# Patient Record
Sex: Female | Born: 1940
Health system: Southern US, Community
[De-identification: ages and names within clinical notes are randomized; demographics above are authoritative.]

## PROBLEM LIST (undated history)

## (undated) DIAGNOSIS — E785 Hyperlipidemia, unspecified: Secondary | ICD-10-CM

## (undated) DIAGNOSIS — Z8711 Personal history of peptic ulcer disease: Secondary | ICD-10-CM

## (undated) DIAGNOSIS — J189 Pneumonia, unspecified organism: Secondary | ICD-10-CM

## (undated) DIAGNOSIS — N179 Acute kidney failure, unspecified: Secondary | ICD-10-CM

## (undated) DIAGNOSIS — K859 Acute pancreatitis without necrosis or infection, unspecified: Secondary | ICD-10-CM

## (undated) DIAGNOSIS — E11610 Type 2 diabetes mellitus with diabetic neuropathic arthropathy: Secondary | ICD-10-CM

## (undated) DIAGNOSIS — I10 Essential (primary) hypertension: Secondary | ICD-10-CM

## (undated) DIAGNOSIS — R011 Cardiac murmur, unspecified: Secondary | ICD-10-CM

## (undated) DIAGNOSIS — I872 Venous insufficiency (chronic) (peripheral): Secondary | ICD-10-CM

## (undated) DIAGNOSIS — R5381 Other malaise: Secondary | ICD-10-CM

## (undated) DIAGNOSIS — E119 Type 2 diabetes mellitus without complications: Secondary | ICD-10-CM

## (undated) DIAGNOSIS — I5042 Chronic combined systolic (congestive) and diastolic (congestive) heart failure: Secondary | ICD-10-CM

## (undated) DIAGNOSIS — I219 Acute myocardial infarction, unspecified: Secondary | ICD-10-CM

## (undated) DIAGNOSIS — Z8719 Personal history of other diseases of the digestive system: Secondary | ICD-10-CM

## (undated) DIAGNOSIS — I2510 Atherosclerotic heart disease of native coronary artery without angina pectoris: Secondary | ICD-10-CM

## (undated) DIAGNOSIS — Z9289 Personal history of other medical treatment: Secondary | ICD-10-CM

## (undated) DIAGNOSIS — E1142 Type 2 diabetes mellitus with diabetic polyneuropathy: Secondary | ICD-10-CM

## (undated) DIAGNOSIS — I350 Nonrheumatic aortic (valve) stenosis: Secondary | ICD-10-CM

## (undated) DIAGNOSIS — Z87442 Personal history of urinary calculi: Secondary | ICD-10-CM

## (undated) DIAGNOSIS — G43909 Migraine, unspecified, not intractable, without status migrainosus: Secondary | ICD-10-CM

## (undated) DIAGNOSIS — I214 Non-ST elevation (NSTEMI) myocardial infarction: Secondary | ICD-10-CM

## (undated) HISTORY — PX: FOOT SURGERY: SHX648

## (undated) HISTORY — DX: Acute pancreatitis without necrosis or infection, unspecified: K85.90

## (undated) HISTORY — PX: EXCISIONAL HEMORRHOIDECTOMY: SHX1541

## (undated) HISTORY — PX: CATARACT EXTRACTION W/ INTRAOCULAR LENS  IMPLANT, BILATERAL: SHX1307

## (undated) HISTORY — PX: APPENDECTOMY: SHX54

## (undated) HISTORY — DX: Type 2 diabetes mellitus with diabetic neuropathic arthropathy: E11.610

## (undated) HISTORY — DX: Cardiac murmur, unspecified: R01.1

## (undated) HISTORY — DX: Nonrheumatic aortic (valve) stenosis: I35.0

## (undated) HISTORY — PX: GLAUCOMA SURGERY: SHX656

## (undated) HISTORY — PX: CARPAL TUNNEL RELEASE: SHX101

## (undated) HISTORY — PX: ABDOMINAL HYSTERECTOMY: SHX81

## (undated) HISTORY — PX: OVARIAN CYST REMOVAL: SHX89

## (undated) HISTORY — DX: Acute myocardial infarction, unspecified: I21.9

## (undated) HISTORY — DX: Hyperlipidemia, unspecified: E78.5

## (undated) HISTORY — DX: Essential (primary) hypertension: I10

## (undated) HISTORY — PX: TONSILLECTOMY: SUR1361

## (undated) HISTORY — PX: DILATION AND CURETTAGE OF UTERUS: SHX78

---

## 2002-07-27 ENCOUNTER — Other Ambulatory Visit: Admission: RE | Admit: 2002-07-27 | Discharge: 2002-07-27 | Payer: Self-pay | Admitting: Obstetrics and Gynecology

## 2003-01-04 ENCOUNTER — Ambulatory Visit (HOSPITAL_COMMUNITY): Admission: RE | Admit: 2003-01-04 | Discharge: 2003-01-04 | Payer: Self-pay | Admitting: Obstetrics and Gynecology

## 2003-06-02 ENCOUNTER — Encounter (INDEPENDENT_AMBULATORY_CARE_PROVIDER_SITE_OTHER): Payer: Self-pay | Admitting: Specialist

## 2003-06-02 ENCOUNTER — Inpatient Hospital Stay (HOSPITAL_COMMUNITY): Admission: RE | Admit: 2003-06-02 | Discharge: 2003-06-04 | Payer: Self-pay | Admitting: Obstetrics and Gynecology

## 2005-05-13 HISTORY — PX: LAPAROSCOPIC LYSIS OF ADHESIONS: SHX5905

## 2005-10-04 ENCOUNTER — Encounter: Admission: RE | Admit: 2005-10-04 | Discharge: 2005-10-04 | Payer: Self-pay | Admitting: Surgery

## 2005-10-31 ENCOUNTER — Encounter: Payer: Self-pay | Admitting: Cardiology

## 2005-10-31 ENCOUNTER — Ambulatory Visit: Payer: Self-pay

## 2005-10-31 ENCOUNTER — Ambulatory Visit: Payer: Self-pay | Admitting: Cardiology

## 2005-11-01 ENCOUNTER — Inpatient Hospital Stay (HOSPITAL_COMMUNITY): Admission: RE | Admit: 2005-11-01 | Discharge: 2005-11-03 | Payer: Self-pay | Admitting: Obstetrics and Gynecology

## 2005-12-05 ENCOUNTER — Inpatient Hospital Stay (HOSPITAL_COMMUNITY): Admission: AD | Admit: 2005-12-05 | Discharge: 2005-12-05 | Payer: Self-pay | Admitting: Obstetrics and Gynecology

## 2005-12-07 ENCOUNTER — Emergency Department (HOSPITAL_COMMUNITY): Admission: EM | Admit: 2005-12-07 | Discharge: 2005-12-08 | Payer: Self-pay | Admitting: Emergency Medicine

## 2005-12-12 ENCOUNTER — Ambulatory Visit (HOSPITAL_COMMUNITY): Admission: RE | Admit: 2005-12-12 | Discharge: 2005-12-12 | Payer: Self-pay | Admitting: Urology

## 2005-12-16 ENCOUNTER — Ambulatory Visit: Payer: Self-pay | Admitting: Internal Medicine

## 2007-02-11 HISTORY — PX: ROBOT ASSISTED PYELOPLASTY: SHX5143

## 2007-02-17 ENCOUNTER — Ambulatory Visit (HOSPITAL_COMMUNITY): Admission: RE | Admit: 2007-02-17 | Discharge: 2007-02-17 | Payer: Self-pay | Admitting: Urology

## 2007-02-19 ENCOUNTER — Ambulatory Visit (HOSPITAL_COMMUNITY): Admission: RE | Admit: 2007-02-19 | Discharge: 2007-02-19 | Payer: Self-pay | Admitting: Urology

## 2007-02-24 ENCOUNTER — Ambulatory Visit: Payer: Self-pay | Admitting: Cardiology

## 2007-03-09 ENCOUNTER — Encounter (INDEPENDENT_AMBULATORY_CARE_PROVIDER_SITE_OTHER): Payer: Self-pay | Admitting: Urology

## 2007-03-09 ENCOUNTER — Inpatient Hospital Stay (HOSPITAL_COMMUNITY): Admission: RE | Admit: 2007-03-09 | Discharge: 2007-03-11 | Payer: Self-pay | Admitting: Urology

## 2007-07-09 ENCOUNTER — Ambulatory Visit (HOSPITAL_COMMUNITY): Admission: RE | Admit: 2007-07-09 | Discharge: 2007-07-09 | Payer: Self-pay | Admitting: Urology

## 2007-11-12 ENCOUNTER — Encounter: Admission: RE | Admit: 2007-11-12 | Discharge: 2007-11-12 | Payer: Self-pay | Admitting: Surgery

## 2007-11-26 ENCOUNTER — Ambulatory Visit: Payer: Self-pay | Admitting: Cardiovascular Disease

## 2007-12-09 ENCOUNTER — Ambulatory Visit: Payer: Self-pay

## 2007-12-09 ENCOUNTER — Encounter: Payer: Self-pay | Admitting: Cardiology

## 2007-12-28 ENCOUNTER — Encounter: Admission: RE | Admit: 2007-12-28 | Discharge: 2007-12-28 | Payer: Self-pay | Admitting: General Surgery

## 2008-01-04 HISTORY — PX: LAPAROSCOPIC INCISIONAL / UMBILICAL / VENTRAL HERNIA REPAIR: SUR789

## 2008-01-09 ENCOUNTER — Emergency Department (HOSPITAL_COMMUNITY): Admission: EM | Admit: 2008-01-09 | Discharge: 2008-01-09 | Payer: Self-pay | Admitting: Emergency Medicine

## 2008-03-14 ENCOUNTER — Ambulatory Visit (HOSPITAL_COMMUNITY): Admission: RE | Admit: 2008-03-14 | Discharge: 2008-03-14 | Payer: Self-pay | Admitting: Urology

## 2008-11-25 DIAGNOSIS — E1122 Type 2 diabetes mellitus with diabetic chronic kidney disease: Secondary | ICD-10-CM | POA: Insufficient documentation

## 2008-11-25 DIAGNOSIS — Z8719 Personal history of other diseases of the digestive system: Secondary | ICD-10-CM | POA: Insufficient documentation

## 2008-11-25 DIAGNOSIS — N183 Chronic kidney disease, stage 3 (moderate): Secondary | ICD-10-CM

## 2008-11-25 DIAGNOSIS — I350 Nonrheumatic aortic (valve) stenosis: Secondary | ICD-10-CM | POA: Insufficient documentation

## 2008-11-28 ENCOUNTER — Ambulatory Visit: Payer: Self-pay | Admitting: Cardiology

## 2008-11-28 DIAGNOSIS — E663 Overweight: Secondary | ICD-10-CM | POA: Insufficient documentation

## 2009-04-03 ENCOUNTER — Ambulatory Visit (HOSPITAL_COMMUNITY): Admission: RE | Admit: 2009-04-03 | Discharge: 2009-04-03 | Payer: Self-pay | Admitting: Urology

## 2009-06-26 ENCOUNTER — Telehealth (INDEPENDENT_AMBULATORY_CARE_PROVIDER_SITE_OTHER): Payer: Self-pay | Admitting: *Deleted

## 2009-07-04 ENCOUNTER — Ambulatory Visit (HOSPITAL_BASED_OUTPATIENT_CLINIC_OR_DEPARTMENT_OTHER): Admission: RE | Admit: 2009-07-04 | Discharge: 2009-07-04 | Payer: Self-pay | Admitting: Urology

## 2009-08-17 HISTORY — PX: SUTURE REMOVAL: SHX1060

## 2009-11-27 ENCOUNTER — Ambulatory Visit: Payer: Self-pay | Admitting: Cardiology

## 2009-11-27 DIAGNOSIS — R079 Chest pain, unspecified: Secondary | ICD-10-CM | POA: Insufficient documentation

## 2009-12-12 ENCOUNTER — Ambulatory Visit: Payer: Self-pay

## 2009-12-12 ENCOUNTER — Ambulatory Visit: Payer: Self-pay | Admitting: Cardiology

## 2009-12-12 ENCOUNTER — Encounter: Payer: Self-pay | Admitting: Cardiology

## 2009-12-12 ENCOUNTER — Ambulatory Visit (HOSPITAL_COMMUNITY): Admission: RE | Admit: 2009-12-12 | Discharge: 2009-12-12 | Payer: Self-pay | Admitting: Cardiology

## 2010-05-28 ENCOUNTER — Ambulatory Visit (HOSPITAL_COMMUNITY)
Admission: RE | Admit: 2010-05-28 | Discharge: 2010-05-28 | Payer: Self-pay | Source: Home / Self Care | Attending: Urology | Admitting: Urology

## 2010-06-03 ENCOUNTER — Encounter: Payer: Self-pay | Admitting: Surgery

## 2010-06-12 NOTE — Assessment & Plan Note (Signed)
Summary: yearly/sl   Visit Type:  Follow-up Primary Provider:  Dr. Jeanie Sewer  CC:  Aortic Stenosis.  History of Present Illness: The patient returns for yearly followup. She has mild aortic stenosis. She has also had chest discomfort with a negative catheterization in 1997 and stress perfusion study with no ischemia in 2009. She still occasionally gets the same kind of chest discomfort that she said over the years. In fact she has taken 2 nitroglycerin in the past year. She thinks it is a stable infrequent pattern. She denies any exertional reproducible discomfort. She can do such activities such as vacuuming though she is limited by joint and feet pain. She denies any new shortness of breath, PND or orthopnea. She denies any palpitations, presyncope or syncope.  Current Medications (verified): 1)  Metformin Hcl 1000 Mg Tabs (Metformin Hcl) .... Two Times A Day 2)  Glimeperide 5mg  .... 1/2 Tab Two Times A Day 3)  Macrobid 100 Mg Caps (Nitrofurantoin Monohyd Macro) .... At Bedtime 4)  Aspirin 81 Mg Tbec (Aspirin) .... Take One Tablet By Mouth Daily 5)  Multivitamins   Tabs (Multiple Vitamin) .... Once Daily 6)  Celebrex 200 Mg Caps (Celecoxib) .... Once Daily As Needed 7)  Enalapril Maleate 20 Mg Tabs (Enalapril Maleate) .Marland Kitchen.. 1 Po Daily 8)  Tramadol Hcl 50 Mg Tabs (Tramadol Hcl) .... As Needed 9)  Pravastatin Sodium 20 Mg Tabs (Pravastatin Sodium) .Marland Kitchen.. 1 By Mouth Daily  Allergies (verified): 1)  ! Levaquin  Past History:  Past Medical History: 1. Diabetes. 2. Pancreatitis. 3. Mild aortic stenosis   Past Surgical History: Reviewed history from 11/28/2008 and no changes required. 1. Removal of an ovarian cyst. 2. Hysterectomy. 3. Tonsillectomy. 4. Exploratory laparotomy and adhesion lysis.  5. 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 Laverle Patter) 6. Ventral Hernia Repair  Review of Systems       Positive for chronic right lower quadrant abdominal  discomfort. Otherwise as stated in the history of present illness negative for all other systems.  Vital Signs:  Patient profile:   70 year old female Height:      65 inches Weight:      163 pounds BMI:     27.22 Pulse rate:   64 / minute Resp:     16 per minute BP sitting:   168 / 82  (right arm)  Vitals Entered By: Marrion Coy, CNA (November 27, 2009 2:02 PM)  Physical Exam  General:  Well developed, well nourished, in no acute distress. Head:  normocephalic and atraumatic Mouth:  Teeth, gums and palate normal. Oral mucosa normal. Neck:  Neck supple, no JVD. No masses, thyromegaly or abnormal cervical nodes. Chest Wall:  no deformities or breast masses noted Lungs:  Clear bilaterally to auscultation and percussion. Abdomen:  Bowel sounds positive; abdomen soft and non-tender without masses, organomegaly, or hernias noted. No hepatosplenomegaly. Msk:  Back normal, normal gait. Muscle strength and tone normal. Extremities:  No clubbing or cyanosis. Neurologic:  Alert and oriented x 3. Skin:  Intact without lesions or rashes. Cervical Nodes:  no significant adenopathy Axillary Nodes:  no significant adenopathy Inguinal Nodes:  no significant adenopathy Psych:  Normal affect.   Detailed Cardiovascular Exam  Neck    Carotids: Carotids full and equal bilaterally without bruits, positive for transmitted systolic murmur right carotid    Neck Veins: Normal, no JVD.    Heart    Inspection: no deformities or lifts noted.      Palpation:  normal PMI with no thrills palpable.      Auscultation: S1 and S2 within normal limits, no S3, no S4, no clicks, no rubs, 3/6 apical systolic murmur mid peaking and radiating up the aortic outflow tract, no diastolic murmurs  Vascular    Abdominal Aorta: no palpable masses, pulsations, or audible bruits.      Femoral Pulses: normal femoral pulses bilaterally.      Pedal Pulses: normal pedal pulses bilaterally.      Radial Pulses: normal radial  pulses bilaterally.      Peripheral Circulation: no clubbing, cyanosis, or edema noted with normal capillary refill.     EKG  Procedure date:  11/27/2009  Findings:      Sinus rhythm, rate 64, axis within normal limits, intervals within normal limits, no acute ST-T wave changes  Impression & Recommendations:  Problem # 1:  AORTIC STENOSIS (ICD-424.1) I believe her murmur is slightly more prominent than previous. It has been 2 years since the last echo and I will repeat this. Orders: EKG w/ Interpretation (93000) Echocardiogram (Echo)  Problem # 2:  ESSENTIAL HYPERTENSION, BENIGN (ICD-401.1) Her blood pressure remains elevated. She recently had her meds adjusted by her primary care. I have asked her to keep a blood pressure diary to return to him as she may need further med titration. Orders: EKG w/ Interpretation (93000)  Problem # 3:  OVERWEIGHT (ICD-278.02) She understands the need for weight loss with diet and exercise.  Problem # 4:  CHEST PAIN (ICD-786.50) I reviewed carefully with her her chest pain symptoms and think this is unchanged compared with previous. I think the possibility that she has developed obstructive coronary disease since the last stress test is low. No further testing is indicated. She needs primary risk reduction.  Patient Instructions: 1)  Your physician recommends that you schedule a follow-up appointment in: 1 year with Dr Antoine Poche 2)  Your physician has recommended you make the following change in your medication: Pravastatin 20 mg every evening 3)  Your physician has requested that you have an echocardiogram.  Echocardiography is a painless test that uses sound waves to create images of your heart. It provides your doctor with information about the size and shape of your heart and how well your heart's chambers and valves are working.  This procedure takes approximately one hour. There are no restrictions for this procedure.

## 2010-06-12 NOTE — Progress Notes (Signed)
  Phone Note From Other Clinic   Caller: [facility] Details for Reason: Pt.Information Initial call taken by: kim M    Faxed LOV,Stress, over to [phone] [facility]  June 26, 2009 10:03 AM

## 2010-06-12 NOTE — Assessment & Plan Note (Signed)
Summary: f1y per check out/lg   Visit Type:  Follow-up Primary Provider:  Dr. Jeanie Sewer  CC:  Aortic Stenosis.  History of Present Illness: The patient presents for yearly followup. Since we last saw her she had ventral hernia repair. She had no problems with this. She is unable to be particularly active because of a Charcot joint and significant foot pain. She does her activities of daily living which includes vacuuming in her home. With this she denies any chest pressure, neck or arm discomfort. She has no shortness of breath, PND or orthopnea. She has no palpitations, presyncope or syncope. She unfortunately has had a weight gain because of inactivity. She has noticed that her blood pressure is creeping up.  Current Medications (verified): 1)  Metformin Hcl 1000 Mg Tabs (Metformin Hcl) .... Two Times A Day 2)  Glimeperide 5mg  .... 1/2 Tab Two Times A Day 3)  Macrobid 100 Mg Caps (Nitrofurantoin Monohyd Macro) .... At Bedtime 4)  Aspirin 81 Mg Tbec (Aspirin) .... Take One Tablet By Mouth Daily 5)  Multivitamins   Tabs (Multiple Vitamin) .... Once Daily 6)  Celebrex 200 Mg Caps (Celecoxib) .... Once Daily As Needed 7)  Enalapril Maleate 5 Mg Tabs (Enalapril Maleate) .... One By Mouth Daily  Allergies (verified): 1)  ! Levaquin  Past History:  Past Medical History: Reviewed history from 11/25/2008 and no changes required. 1. Diabetes. 2. Pancreatitis. 3. Mild aortic stenosis as described.   Past Surgical History: 1. Removal of an ovarian cyst. 2. Hysterectomy. 3. Tonsillectomy. 4. Exploratory laparotomy and adhesion lysis.  5. 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 Laverle Patter) 6. Ventral Hernia Repair  Review of Systems       Resting tremor. Otherwise negative for all other systems except as stated in the history of present illness.  Vital Signs:  Patient profile:   70 year old female Height:      65 inches Weight:      169 pounds BMI:      28.22 Pulse rate:   76 / minute BP sitting:   156 / 82  (right arm) Cuff size:   regular  Vitals Entered By: Hardin Negus, RMA (November 28, 2008 2:02 PM)  Physical Exam  General:  Well developed, well nourished, in no acute distress. Head:  normocephalic and atraumatic Eyes:  PERRLA/EOM intact; conjunctiva and lids normal. Mouth:  Teeth, gums and palate normal. Oral mucosa normal. Neck:  Neck supple, no JVD. No masses, thyromegaly or abnormal cervical nodes. Chest Wall:  no deformities or breast masses noted Lungs:  Clear bilaterally to auscultation and percussion. Abdomen:  Bowel sounds positive; abdomen soft and non-tender without masses, organomegaly, or hernias noted. No hepatosplenomegaly. Msk:  Back normal, normal gait. Muscle strength and tone normal. Extremities:  No clubbing or cyanosis. Neurologic:  Alert and oriented x 3. Skin:  Intact without lesions or rashes. Cervical Nodes:  no significant adenopathy Axillary Nodes:  no significant adenopathy Inguinal Nodes:  no significant adenopathy Psych:  Normal affect.   Detailed Cardiovascular Exam  Neck    Carotids: Carotids full and equal bilaterally without bruits.      Neck Veins: Normal, no JVD.    Heart    Inspection: no deformities or lifts noted.      Palpation: normal PMI with no thrills palpable.      Auscultation: 3/6 systolic murmur radiating slightly at the aortic outflow tract, no diastolic murmurs, no clicks, no rubs, S1 and S2 within normal  limits  Vascular    Abdominal Aorta: no palpable masses, pulsations, or audible bruits.      Femoral Pulses: normal femoral pulses bilaterally.      Pedal Pulses: normal pedal pulses bilaterally.      Radial Pulses: normal radial pulses bilaterally.      Peripheral Circulation: no clubbing, cyanosis, or edema noted with normal capillary refill.     EKG  Procedure date:  11/28/2008  Findings:      sinus rhythm, rate 76, axis within normal limits, intervals  within normal limits, no acute ST-T wave changes.  Impression & Recommendations:  Problem # 1:  AORTIC STENOSIS (ICD-424.1) Her systolic murmur would not suggest that her aortic stenosis is worse. Her EKG is unremarkable. She has no symptoms suggesting severe aortic stenosis. Therefore, no echocardiogram is indicated. We will follow this clinically. Orders: EKG w/ Interpretation (93000)  Problem # 2:  ESSENTIAL HYPERTENSION, BENIGN (ICD-401.1) Her blood pressure has been creeping up. I have taken the liberty of increasing her enalapril to 5 mg b.i.d.  Problem # 3:  DM (ICD-250.00) She reports that this is well controlled. She also reports that her lipids are within normal limits.  Problem # 4:  OVERWEIGHT (ICD-278.02) The patient understands the need to lose weight with diet and exercise.  Patient Instructions: 1)  Your physician recommends that you schedule a follow-up appointment in: 12 months 2)  Your physician has recommended you make the following change in your medication: Enalapril 5 mg two times a day Prescriptions: ENALAPRIL MALEATE 5 MG TABS (ENALAPRIL MALEATE) Take one tablet by mouth twice a day  #60 x 11   Entered by:   Dossie Arbour, RN, BSN   Authorized by:   Rollene Rotunda, MD, [facility]   Signed by:   Dossie Arbour, RN, BSN on 11/28/2008   Method used:   Electronically to        [facility]. [phone]* (retail)       [address]       [place], [place]  [address]       Ph: [phone] or [phone]       Fax: [phone]   RxID:   [id number]

## 2010-08-01 LAB — POCT I-STAT 4, (NA,K, GLUC, HGB,HCT)
Glucose, Bld: 116 mg/dL — ABNORMAL HIGH (ref 70–99)
HCT: 35 % — ABNORMAL LOW (ref 36.0–46.0)
Hemoglobin: 11.9 g/dL — ABNORMAL LOW (ref 12.0–15.0)
Potassium: 5 mEq/L (ref 3.5–5.1)
Sodium: 139 mEq/L (ref 135–145)

## 2010-08-01 LAB — GLUCOSE, CAPILLARY: Glucose-Capillary: 127 mg/dL — ABNORMAL HIGH (ref 70–99)

## 2010-09-25 NOTE — Op Note (Signed)
Emily Miranda, Emily Miranda             ACCOUNT NO.:  [id number]   MEDICAL RECORD NO.:  [id number]          PATIENT TYPE:  INP   LOCATION:  X003                         FACILITY:  [facility]   PHYSICIAN:  Emily Purpura, MD      DATE OF BIRTH:  June 19, 1940   DATE OF PROCEDURE:  03/09/2007  DATE OF DISCHARGE:                               OPERATIVE REPORT   PREOPERATIVE DIAGNOSIS:  Right ureteropelvic junction obstruction.   POSTOPERATIVE DIAGNOSIS:  Right ureteropelvic junction obstruction.   OPERATION/PROCEDURE:  1. Cystoscopy.  2. Right retrograde pyelography.  3. Right ureteral stent placement (8 x 28)  4. Right robotic assisted laparoscopic dismemberd pyeloplasty.   SURGEON:  Emily Miranda, M.D.   ASSISTANT:  Bertram Millard. Dahlstedt, M.D.   ANESTHESIA:  General.   COMPLICATIONS:  None.   ESTIMATED BLOOD LOSS:  50 mL.   RADIOLOGIC FINDINGS:  The patient underwent a right retrograde pyelogram  which demonstrated a normal caliber ureter.  The patient was noted to  have a significantly dilated renal pelvis with narrowing at the level of  the ureteropelvic junction.  This did appear to be consistent with  kinking possibly due to a crossing vessel.   DRAINS:  1. 15-French perinephric drain.  2. 16-French Foley catheter.   SPECIMENS:  Right ureteropelvic junction.   DISPOSITION:  The specimen to pathology.   INDICATIONS:  Emily Miranda is a 70 year old female who presented with  right-sided flank pain and was found have a right ureteropelvic junction  obstruction.  She underwent a preoperative evaluation which demonstrated  preserved renal function and a crossing lower pole right renal artery.  After discussing management options, she elected to proceed with the  above procedures.  Potential risks, complications and alternative  options were discussed in detail with the patient and informed consent  was obtained.   DESCRIPTION OF PROCEDURE:  The patient was taken to the operating  room  and a general anesthetic was administered.  She was given preoperative  antibiotics, placed in the dorsal lithotomy position, and prepped and  draped in the usual sterile fashion.  Next a preoperative time-out was  performed.  Cystourethroscopy was then performed which allowed  identification of the patient's indwelling right ureteral stent.  This  was brought out through the urethra with the flexible graspers.  A 0.038  sensor guidewire was then advanced through the stent up into the renal  pelvis under fluoroscopic guidance.  A 6-French ureteral catheter was  then advanced over the wire and a retrograde pyelogram was performed  which demonstrated findings as dictated above.  The wire was then  replaced into the renal pelvis and back loaded through the cystoscope  sheath.  An 8 x 28 double-J ureteral stent was then advanced over the  wire using Seldinger technique.  Once the wire was appropriately  positioned under fluoroscopic and cystoscopic guidance, the wire was  removed.  A 16-French Foley catheter was inserted into the bladder.  The  patient was then repositioned in the right modified flank position with  care to pad any potential pressure points.  Again a preoperative time-  out was performed and the patient's abdomen was prepped and draped in  the usual sterile fashion.  A site was selected to the right of the  umbilicus for placement of the camera port.  This was placed using a  standard open Hassan technique.  This allowed entry into the peritoneal  cavity under direct vision without difficulty.  A 12 mm port was then  placed and a pneumoperitoneum was established.  With 0 degrees lens, the  abdomen was inspected.  There was noted to be adhesions between the  omentum and the abdominal wall in the right lower quadrant.  The 8 mm  robotic ports were then placed in the right upper quadrant and right  lower quadrant.  The adhesions were then taken down with laparoscopic   scissors carefully, allowing an additional 8 mm robotic port to be  placed in the far lateral right lower quadrant.  Finally, an additional  12 mm port was placed between the camera port and the right upper  quadrant robotic port.  All ports were placed under direct vision  without difficulty.  The surgical cart was then docked.  With the aid of  the cautery scissors, the white line of Toldt was incised along the  length of the ascending colon, allowing the colon to be mobilized  medially and the space between the anterior layer of Gerota's fascia and  the colonic mesentery to be developed.  The duodenum was identified and  was also mobilized medially, thereby exposing the inferior vena cava.  The inferior vena cava was then cleared of overlying tissue which  allowed the space between the ureter and gonadal vein and the psoas  muscle to be developed.  The ureter and gonadal vein lifted anteriorly  off the psoas muscle and dissection proceeded superiorly.  The gonadal  vein was identified, isolated and was divided.  There was noted to be a  lower pole anterior renal artery and vein.  The ureter was dissected  free inferior to these vessels with care to preserve the adventitial  tissue.  Superior to the lower pole vessels, the renal pelvis was  identified and was also cleared of overlying adipose tissue.  Once the  renal pelvis had been isolated and dissected enough, 4-0 Vicryl holding  stitches was  placed into the renal pelvis and the ureter.  The  ureteropelvic junction was then excised, allowing identification of the  patient's ureteral stent.  The right UPJ was then sent for permanent  pathologic analysis.  The ureter was then spatulated laterally and the  renal pelvis was spatulated medially.  There was not noted to be a  redundant pelvis requiring excision.  The renal pelvis and ureter were  then transposed anterior to the lower pole vessels and 4-0 Vicryl  reapproximation sutures  were placed at the lateral and medial aspects of  the renal pelvis and ureter to reapproximate these structures.  The  ureteral stent was placed back into the renal pelvis and running 4-0  Vicryl sutures were then used to close the anterior and posterior  aspects of the anastomosis.  The anastomosis was performed in a tension-  free and watertight fashion.  A #15 Blake drain was then placed near the  anastomosis and appropriately positioned to the skin with a nylon  suture.  The surgical cart was then undocked and 0 Vicryl sutures were  used to close the two 12 mm port sites.  The sutures were placed with  the aid of  the suture passer device.  All remaining ports were then  removed under direct vision. The pneumoperitoneum was expelled.  Hemostasis remained excellent and the previously placed 0 Vicryl sutures  were used to close the 12 mm fascial opening.  All incision sites were  injected with 0.25% Marcaine and reapproximated at the skin with  staples.  Sterile dressings were applied.  The patient appeared to  tolerate the procedure well without complications.  She was able to be  extubated and transferred to recovery unit in satisfactory condition.     Emily Purpura, MD  Electronically Signed    LB/MEDQ  D:  03/09/2007  T:  03/09/2007  Job:  [id number]

## 2010-09-25 NOTE — Discharge Summary (Signed)
NAMEHARRY, BARK             ACCOUNT NO.:  [id number]   MEDICAL RECORD NO.:  [id number]          PATIENT TYPE:  INP   LOCATION:  1428                         [facility]ACILITY:  [facility]   PHYSICIAN:  Heloise Purpura, MD      DATE OF BIRTH:  10/23/1940   DATE OF ADMISSION:  03/09/2007  DATE OF DISCHARGE:  03/11/2007                               DISCHARGE SUMMARY   ADMISSION DIAGNOSIS:  Right ureteropelvic junction obstruction.   DISCHARGE DIAGNOSIS:  Right ureteropelvic junction obstruction.   HISTORY AND PHYSICAL:  For full details, please see admission history  and physical.  Briefly, Ms. Purdie is a 70 year old female who was  found to have a right ureteropelvic junction obstruction after  presenting with right flank pain.  She was initially managed with stent  drainage and was found to have preserved renal function.  After  discussing management options and undergoing a CT angiogram, which did  demonstrate a lower pole crossing renal vessel, she elected to proceed  with a robotic-assisted laparoscopic dismembered pyeloplasty.   HOSPITAL COURSE:  On March 09, 2007, the patient was taken to the  operating room.  She underwent cystoscopy with ureteral stent change and  subsequent right robotic- assisted laparoscopic dismembered pyeloplasty.  She tolerated this procedure well and without complications;  postoperatively was able to be transferred to a regular hospital room.   She was able to begin ambulating the night of surgery.  Her diet was  gradually advanced over the course of the first 2 postoperative days.  Her Foley catheter was removed on postoperative day #1, and she  maintained minimal drainage from her perinephric drain.  This was sent  for creatinine level on postoperative day #2; and was found to be  consistent with serum at 0.9.   She was therefore able to be discharged home in excellent condition on  postoperative day #2.   DISPOSITION:  Home.   DISCHARGE  MEDICATIONS:  Ms. Archibald was instructed to resume her regular  home medications.  In addition, she was given a prescription to take  Vicodin as needed for pain and told to use Colace as a stool softener.   DISCHARGE INSTRUCTIONS:  She was instructed to be ambulatory, but  specifically told to refrain from any heavy lifting, strenuous activity,  or driving.  She was told to resume her usual diet, and was instructed  on the signs and symptoms of wound infection.   FOLLOWUP:  Ms. Krysiak will follow-up in the next 1-2 weeks for further  postoperative evaluation.     Heloise Purpura, MD  Electronically Signed    LB/MEDQ  D:  03/11/2007  T:  03/12/2007  Job:  [id number]

## 2010-09-25 NOTE — Assessment & Plan Note (Signed)
[facility]                            CARDIOLOGY OFFICE NOTE   Emily Miranda, Emily Miranda                    MRN:          [id number]  DATE:02/24/2007                            DOB:          Nov 15, 1940    PRIMARY CARE PHYSICIAN:  Dr. Gwendlyn Deutscher.   REASON FOR PRESENTATION:  Preoperative evaluation in a patient with  aortic stenosis, chest pain, and dyspnea.   HISTORY OF PRESENT ILLNESS:  The patient is a pleasant 70 year old.  She  is referred preoperatively, as she has a history as above.  She has had  aortic stenosis with the last echocardiogram in 2007 demonstrating mild  aortic stenosis with a thickened valve.  There was good LV function.  The patient's past cardiac history includes chest discomfort going on  for over 10 years.  For this, she has had cardiac catheterization  demonstrating normal coronaries in 1997.  She has had a persistent chest  pain pattern since that time.  There has been no difference to it.  She  says the pain happens sporadically.  It is substernal.  It is like  somebody riding across her chest.  It goes away spontaneously.  She may  get short of breath with it.  She cannot bring it on with any activity.  The etiology has never been clear.  She does not get nausea, vomiting,  or diaphoresis.  It seems to be moderate in intensity, and again, of a  stable frequency, duration, and type since the catheterization.   She does have dyspnea.  This also has been sporadic.  She cannot bring  this on in particular.  She can do things like walking without bringing  it on.  She does not describe classic PND or orthopnea.  She has had the  catheterization as mentioned, and also an echocardiogram most recently  in 2007 demonstrating the above.  The etiology of the dyspnea is not  clear, and has been a stable pattern.   Finally, the patient reports syncope.  She wore an event monitor for  this, and had no etiology.  She has been seen in  the past with stress  perfusion studies, as well as the above without a clear etiology.  This  has also been a stable pattern brought on by emotional stress, and most  likely represents a vasovagal event.   Currently, the patient is recovering from an ulcer on her foot.  This  has been there, apparently related to a callus.  She had infection with  it.  Prior to this, this summer, she was able to do things like walk for  exercise and swim without bringing on any of the above symptoms.  She  has a high functional level.  Even now she is able to do her chores of  daily living (greater than 5 METS).  With this, she does not routinely  get any chest discomfort, neck, or arm discomfort.  She does not  describe any palpitations.  She has appropriate shortness of breath with  activities.   She is being considered for, apparently, a robot-assisted urologic  procedure  to include cystoscopy, right ureteral stent, and laparoscopic  pyeloplasty.   ALLERGIES:  NONE.   MEDICATIONS:  1. Microbid.  2. Glipizide 5 mg daily.  3. Vitamin.  4. Urelle.  5. Amoxicillin.  6. Cephalexin.  7. Metformin 1000 mg daily.   PAST MEDICAL HISTORY:  1. Diabetes.  2. Pancreatitis.  3. Mild aortic stenosis as described.   PAST SURGICAL HISTORY:  1. Removal of an ovarian cyst.  2. Hysterectomy.  3. Tonsillectomy.  4. Exploratory laparotomy and adhesion lysis.   SOCIAL HISTORY:  The patient is retired.  She is married and has 3  children.  She does not smoke cigarettes or drink alcohol.   FAMILY HISTORY:  Contributory for pancreatitis, but no early coronary  disease.   REVIEW OF SYSTEMS:  As stated in the HPI, and otherwise negative for  other systems.   PHYSICAL EXAMINATION:  The patient is well appearing, in no distress.  Blood pressure 164/82.  Heart rate 57 and regular.  Weight 167 pounds.  Body mass index 28.  HEENT:  Eyelids unremarkable.  Pupils equal, round, and reactive to  light.  Fundi  not visualized.  Oral mucosa unremarkable.  NECK:  No jugular venous distention.  Wave form within normal limits.  Carotid upstroke brisk and symmetric.  No bruits.  No thyromegaly.  There are transmitted systolic murmurs.  LYMPHATICS:  No cervical, axillary, or inguinal adenopathy.  LUNGS:  Clear to auscultation bilaterally.  BACK:  No costovertebral angle tenderness.  CHEST:  Unremarkable.  HEART:  PMI not displaced or sustained.  S1 and S2 within normal limits.  No S3.  No S4.  A 3/6 apical early to mid peaking systolic murmur  radiating out the aortic outflow tract, and through the carotids.  No  diastolic murmurs.  ABDOMEN:  Flat.  Positive bowel sounds.  Normal in frequency and pitch.  No bruits.  No rebound.  No guarding.  No midline pulsatile mass.  No  hepatomegaly.  No splenomegaly.  SKIN:  No rashes.  No nodules.  EXTREMITIES:  Two plus pulses throughout (unable to appreciate dorsalis  pedis, as the foot is bandaged).  There is trace bilateral lower  extremity edema.  NEUROLOGIC:  Oriented to person, place, and time.  Cranial nerves 2  through 12 grossly intact.  Motor grossly intact.  EKG:  Sinus bradycardia, rate 57.  Axis within normal limits.  Intervals  within normal limits.  No acute ST-T wave changes.   ASSESSMENT AND PLAN:  1. Preoperative clearance.  The patient has an excellent functional      level.  She has no active high-risk cardiovascular features.      Complaints described above have been worked up thoroughly in the      past, and have had no change.  She is going for a moderate-risk      procedure from a cardiovascular standpoint.  Therefore, based on      the ACC/AHA guidelines, the patient is at acceptable risk for the      planned procedure without further cardiovascular testing.  2. Hypertension.  Blood pressure is slightly elevated.  However, she      reports that this is an anomaly, as it has been taken at doctor's      offices and found to be in  the 120s.  She has a blood pressure cuff      at home, and will keep a blood pressure diary to have this followed  and treated as needed.  3. Risk reduction.  I would suggest, if she has not had one, a lipid      profile with a low threshold for a statin with her risk equivalent      of diabetes.  This is based on the Heart Protection Study.  4. Aortic stenosis.  This has been mild.  She is not having any      symptoms related to this.  This was followed with an echo last      year.  I think she can come back, and I will probably repeat an      echo in 1 year, or sooner if she develops any new symptoms.  5. Followup.  As above, I will see her in 1 year.     Rollene Rotunda, MD, [facility]  Electronically Signed    JH/MedQ  DD: 02/24/2007  DT: 02/25/2007  Job #: [id number]   cc:   Crecencio Mc, M.D.  Gwendlyn Deutscher II, M.D.

## 2010-09-25 NOTE — H&P (Signed)
NAMEJAI, BEAR             ACCOUNT NO.:  [id number]   MEDICAL RECORD NO.:  [id number]          PATIENT TYPE:  EMS   LOCATION:  ED                           FACILITY:  [facility]   PHYSICIAN:  Anselm Pancoast. Weatherly, M.D.DATE OF BIRTH:  1941-01-15   DATE OF ADMISSION:  01/09/2008  DATE OF DISCHARGE:                              HISTORY & PHYSICAL   CHIEF COMPLAINT:  Abdominal pain and shortness of breath following a  laparoscopic [facility]  [facility], 5 days earlier.   HISTORY:  Emily Miranda is a 70 year old Caucasian female who resides  in [place].  Her primary care physician is in [place] and she was seen  by Dr. Cyndia Bent and has a hernia in the lower abdomen from a  previous GYN surgery.  The patient was then switched to Dr. Bertram Savin,  who did a laparoscopic ventral [facility]nia repair [facility] on  Monday.  The patient had been clean cleared by the [facility] Cardiologist  prior to the surgery since she has a history of aortic stenosis and also  has had previous problems with pancreatitis.  She is a mild diabetic and  underwent the procedure, was released the following morning, and over  the past several days has had kind of vague aches and pains.  Said she  was having shortness of breath, et Karie Soda and has had a previous problem  with shortness of breath with taking the Percocet, so she elected to  discontinue the Percocet that Dr. Freida Busman had used for pain medication  last evening.  Today she was complaining of more pain.  Her daughter who  has just moved back to the area thought the patient needed to be seen.  Called and I volunteered to see her in the emergency room instead of  getting get seen by the ER physician, and she arrived here probably  about 3:00.  The patient was seen in the waiting room as there were no  beds in back and she did not appear to be that acutely ill.  She was  ambulating and was scheduled for  a chest x-ray, EKG, laboratory studies,  flat upright abdominal films.  She was gotten back to the triage area  rooms approximately 2 hours later.   EXAMINATION:  VITAL SIGNS:  Her vital signs have always been normal.  Temperature was 97.4, blood pressure is 153/69, pulse 89 and a  respirations are 18.  RESPIRATORY:  She is not wheezing are having any shortness of breath,  and a flat and upright abdominal x-ray showed a nonspecific gas pattern.  You could see the little tacks used to fashion the mesh in the intra-  abdominal space, and a chest x-ray, PA and lateral was identical to her  preoperative chest x-ray.   LABORATORY STUDIES:  Show a white count of 7600 with hematocrit of 35.6.  Her electrolytes were normal.  Her glucose is 146.  She is on oral  medication for this and she has had several bowel movements since she  has actually been here.  Her amylase  is 62 and the patient says that she  is voiding without problems and there has been no blood in her urine.  She now, with the studies being normal, decides that she is feeling good  enough that she can be released and wonders if she can take Vicodin for  her pain as it is not as strong as the Percocet and she takes it on a  chronic basis when she has pancreatitis attacks.  As far as her  abdominal exam, her abdomen is soft.  She has got the multiple little  incisions that all look as if they are healing without evidence of any  inflammation from the suture site.  The mesh and also the laparoscopic  ports in her abdomen are nontender, and there is no excessive bruising.  I think it would be fine to release her at this time.  Will give her a  prescription for Vicodin and she has an appointment see Dr. Freida Busman in  approximately 2 weeks.  I think she definitely does need to be seen  again by Dr. Freida Busman this coming week.  The patient will continue on her  chronic medications for diabetes and blood pressure, and if she is  having fever,  nausea, vomiting or appears to be getting worse, then she  will return and then with agree to have a CT.  I think with having good  bowel sounds, her bowels working and not acutely tender, that it is  unlikely that we will find anything if the CT was obtained at this time,  but I could not be sure.  The patient does not want to have a CT this  evening.  I have reviewed the x-rays of her abdomen and chest with her  and her daughter and they appeared  comfortable again releasing her.  They do want to prescription for  Vicodin and she takes this chronically and she thinks she has got  probably 10 or so, but I will give her prescription for 20 with 1  refill.  They will call and be seen by Dr. Freida Busman this coming week.           ______________________________  Anselm Pancoast. Zachery Dakins, M.D.     WJW/MEDQ  D:  01/09/2008  T:  01/10/2008  Job:  [id number]   cc:   Lennie Muckle, MD  [address]   [address]  [place] [address]

## 2010-09-25 NOTE — Assessment & Plan Note (Signed)
[facility]                            CARDIOLOGY OFFICE NOTE   Emily Miranda, Emily Miranda                    MRN:          [id number]  DATE:11/26/2007                            DOB:          Jun 23, 1940    [name] is seen today at the request of Dr. Freida Busman for preop  clearance.   She was added onto the DOD schedule.   The patient last saw Dr. Antoine Poche in October of 2008.   Coronary risk factors include diabetes and hypertension.   The patient has a ventral hernia from previous abdominal surgeries.  It  is fairly large and needs to be repaired.  Apparently, the patient  initially saw Dr. Jamey Ripa, but Dr. Freida Busman will be doing the surgery  hopefully laparoscopically.   The patient did have chest pain a few weeks ago.  The pain was atypical.  It was nonexertional.  However, it was quite severe and it was in the  center of her chest, radiated to her shoulder, lasted for most of the  day intermittently.   Looking through the patient's records, she has no documented history of  coronary artery disease.  She had a normal cath back in 1997.  I do not  see that she has had any recent stress test.   She does have a history of mild aortic stenosis.  She has not had an  echo since October 31, 2005.  At that time, the mean gradient was 13 and  the peak gradient was 26.  She had normal LV function.   I told the patient in the setting of chest pain and a history of aortic  stenosis, I thought she should have an echo and a stress Myoview prior  to proceeding with surgery.  She also has had some exertional dyspnea  and it seems functional.  There has been no PND, orthopnea, and no  swelling.  No active wheezing.  No sputum production or fevers.   REVIEW OF SYSTEMS:  Otherwise negative.   PAST MEDICAL HISTORY:  Remarkable for congenital obstruction of the  ureteropelvic junction with surgery last year by Dr. Laverle Patter, no surgical  complications.  She also has a  history of diabetes, pancreatitis, and  aortic stenosis.   SURGICAL HISTORY:  Previous hysterectomy, previous tonsillectomy, and  previous ovarian cyst.   SOCIAL HISTORY:  The patient is married.  She has 4 children, one of her  children has had lifelong seizures with multiple brain operations at  [facility] and is still living at home.  She is a retired Building services engineer.  She does  not smoke or drink.   FAMILY HISTORY:  Noncontributory.   ALLERGIES:  She has no known allergies.   MEDICATIONS:  She is currently taking Macrobid daily, vitamins,  metformin 1 g a day, enalapril question dose, and glyburide 5 mg a day.   PHYSICAL EXAMINATION:  GENERAL:  Remarkable for an elderly white female  in no distress.  VITAL SIGNS:  Weight is 165, blood pressure is 160/80, pulse is 82 and  regular, respiratory rate 14, and afebrile.  HEENT:  Unremarkable.  Carotids  have no parvus and no tardus.  There is  a mild transmitted murmur.  No lymphadenopathy, thyromegaly, or JVP  elevation.  LUNGS:  Clear, good diaphragmatic motion.  No wheezing, S1, second heart  sound is preserved.  There is a mild-to-moderate AS murmur.  No aortic  insufficiency.  PMI normal.  ABDOMEN:  Protuberant.  She has a midline scar and multiple smaller  scars from her urological surgery.  There is a ventral hernia that is in  the suprapubic location.  ABDOMEN:  Otherwise benign.  Bowel sounds are positive.  No AAA.  No  hepatosplenomegaly.  No hepatojugular reflux.  Currently, not tender.  Distal pulses are intact.  No edema.  NEURO:  Nonfocal.  SKIN:  Warm and dry.  No muscular weakness.   EKG is normal.   IMPRESSION:  1. Preoperative clearance in the patient who is a diabetic with recent      atypical chest pain and aortic stenosis.  Follow up stress Myoview.  2. Aortic stenosis certainly not severe by exam.  Follow up 2-D      echocardiogram since it has been over 2 years and the patient needs      to have surgery.  3.  Diabetes.  Follow up with primary care MD.  Hemoglobin A1c      quarterly.  Continue metformin and glyburide.  4. Borderline hypertension.  The patient will call us with her current      dose of enalapril.  This may need to be increased.  Continue low-      salt diet.  5. Previous urological problems.  Continue Macrobid.  Follow up with      Dr. Laverle Patter p.r.n.   As long as the patient's echo and Myoview are at low-risk, she will  likely be cleared for surgery per Dr. Antoine Poche.     [name]. [name], MD, [facility]  Electronically Signed    PCN/MedQ  DD: 11/26/2007  DT: 11/27/2007  Job #: [id number]

## 2010-09-25 NOTE — H&P (Signed)
NAMEPRESLEIGH, FELDSTEIN             ACCOUNT NO.:  [id number]   MEDICAL RECORD NO.:  [id number]          PATIENT TYPE:  INP   LOCATION:  X003                         FACILITY[facility]   PHYSICIAN:  Heloise Purpura, MD      DATE OF BIRTH:  Jan 18, 1941   DATE OF ADMISSION:  03/09/2007  DATE OF DISCHARGE:                              HISTORY & PHYSICAL   CHIEF COMPLAINT:  Right ureteropelvic junction obstruction.   HISTORY:  [name] is 70 year old female who initially presented with  severe right-sided flank pain approximately 1 year ago.  She underwent  retrograde pyelography which demonstrated findings consistent with a  ureteropelvic junction obstruction and a ureteral stent was placed which  significantly improved her pain symptoms.  Due to her wish not to  proceed with definitive therapy at that time for financial reasons, she  was maintained with a ureteral stent and followed closely.  She was  subsequently sent to me for evaluation in September 2008 for evaluation  of possible consideration of a minimally invasive dismembered  pyeloplasty.  She was interested in possibly undergoing an antegrade  endopyelotomy.  A CT angiogram was performed, however, which did  demonstrate a lower pole crossing renal vessel.  At this point, she was  counseled that she would likely be best served with a dismembered  pyeloplasty.  She, therefore, elected to proceed with a right robotic-  assisted laparoscopic dismembered pyeloplasty.   PAST MEDICAL HISTORY:  1. Diabetes.  2. Pancreatitis.  3. Aortic stenosis.  4. Coronary artery disease.   PAST SURGICAL HISTORY:  1. Removal of ovarian cyst.  2. Hysterectomy.  3. Tonsillectomy.  4. Exploratory laparotomy and adhesiolysis.   MEDICATIONS:  1. Glipizide.  2. Macrobid.  3. Aspirin.  4. Metformin.  5. Nitroglycerin.   ALLERGIES:  No known drug allergies.   FAMILY HISTORY:  The patient's mother had chronic renal insufficiency of  unknown  cause.  The patient's father had diabetes and died at age 87 due  to a heart attack.   SOCIAL HISTORY:  The patient is retired.  She is married.  She denies  tobacco or alcohol use.   REVIEW OF SYSTEMS:  The patient does have chronic constipation and a  history of headaches.  All other systems are reviewed and otherwise  negative.   PHYSICAL EXAM:  CONSTITUTIONAL:  Well-nourished, well-developed age-  appropriate female in no acute distress.  CARDIOVASCULAR:  Regular rate and rhythm with a systolic ejection  murmur.  ABDOMEN:  Obese, soft, nondistended and nontender.  The patient does  have a well-healed lower midline incision.  BACK:  No CVA tenderness.  EXTREMITIES:  No edema.   IMAGING:  The patient did undergo a baseline nuclear medicine renal scan  which demonstrated preserved function of the right kidney with 46%  relative renal function of the right kidney versus 54% on the left.   IMPRESSION:  Right ureteropelvic obstruction.   PLAN:  [name] has undergone a preoperative cardiac evaluation and  was felt to be at low to moderate risk for undergoing this procedure.  After discussing the risks and  benefits, she did elect to proceed with  and will undergo a right ureteropelvic junction obstruction repair.  She  will undergo cystoscopy, right ureteral stent placement and then a  robotic-assisted laparoscopic pyeloplasty.  She will then be admitted to  the hospital for routine postoperative care.      Heloise Purpura, MD  Electronically Signed     LB/MEDQ  D:  03/09/2007  T:  03/09/2007  Job:  [id number]

## 2010-09-28 NOTE — Op Note (Signed)
NAME:  Emily Miranda, Emily Miranda                       ACCOUNT NO.:  [id number]   MEDICAL RECORD NO.:  [id number]                   PATIENT TYPE:  INP   LOCATION:  9317                                 FACILITY:  [facility]   PHYSICIAN:  Miguel Aschoff, M.D.                    DATE OF BIRTH:  04/24/1941   DATE OF PROCEDURE:  06/02/2003  DATE OF DISCHARGE:                                 OPERATIVE REPORT   PREOPERATIVE DIAGNOSES:  1. Chronic pelvic pain.  2. Extensive pelvic adhesions.   POSTOPERATIVE DIAGNOSES:  1. Chronic pelvic pain.  2. Extensive pelvic adhesions.   PROCEDURES:  1. Exploratory laparotomy.  2. Lysis of adhesions.  3. Bilateral salpingo-oophorectomy.   SURGEON:  Miguel Aschoff, M.D.   ASSISTANT:  Luvenia Redden, M.D.   ANESTHESIA:  General.   COMPLICATIONS:  None.   JUSTIFICATION:  The patient is a 70 year old white female status post  hysterectomy in 1985.  The patient had developed persistent left lower  quadrant pain, underwent laparoscopy in 2004, which revealed extensive  pelvic adhesions, which were lysed laparoscopically.  The patient has had a  recurrence of her pelvic pain and now is to undergo laparotomy with  bilateral salpingo-oophorectomy in order to relieve her left lower abdominal  pain.  Risks and benefits have been discussed with the patient.   DESCRIPTION OF PROCEDURE:  The patient was taken to the operating room and  placed in a supine position and general anesthesia was administered without  difficulty.  She was then prepped and draped in the usual sterile fashion.  A Foley catheter was inserted.  Her previous midline incision was then  reincised, extended down through the subcutaneous tissue with bleeding  points being clamped and coagulated as they were encountered.  The fascia  was then identified and incised vertically.  The peritoneum was then found  and entered, carefully avoiding underlying structures.  The peritoneal  incision was extended under  direct visualization.  There were adhesions of  the omentum to the anterior abdominal wall, which were taken down with sharp  dissection without difficulty.  At this point a self-retaining retractor was  placed through the wound, the viscera were packed out of the pelvis.  She  had adhesions of her sigmoid colon to the dome of the bladder as well as to  the left lateral pelvic sidewall and left tube and ovary.  There were also  multiple adhesions involving the appendices epiploica of the sigmoid colon.  All these adhesions were identified and taken down sharply.  The  rectosigmoid was freed off the bladder dome, freed off the cul-de-sac, and  freed off the lateral pelvic sidewall.  It was then possible to identify the  left tube and ovary.  The ovary was grasped with a Babcock clamp and  elevated.  The ureter was identified.  The infundibulopelvic ligament was  found and then an avascular region  between the round ligament and the  infundibulopelvic ligament was opened.  With vessels being identified and  the ureter out of the field, the infundibulopelvic vessels were identified,  clamped, cut, and doubly ligated with two ligatures of 0 Vicryl.  Dissection  was then carried medially, removing the tube and ovary without difficulty.  This pedicle was clamped with a Kelly clamp and then doubly ligated with  ligatures of 0 Vicryl.  On the right side the ovary was adherent to the  lateral pelvic sidewall.  It was freed off the sidewall, freed off the dome  of the bladder, and then again an avascular region between the  infundibulopelvic ligament and round ligament, this area was opened, the  ureter identified, and then the infundibulopelvic ligament clamped and  doubly ligated with two ligatures of 0 Vicryl, and dissection was carried  medially and the specimen excised.  With no other abnormalities being noted,  it was elected to complete the procedure.  There was excellent hemostasis at  this  point.  The lap counts were taken and found to be correct, and then the  abdomen was closed.  The parietal peritoneum was closed using running  continuous 0 Vicryl suture.  The fascia was closed using two sutures of 0  Vicryl, each starting at the angles of the fascial incision and meeting in  the midline.  The subcutaneous tissue was closed using interrupted 0 Vicryl  suture, and the skin incision was closed using staples.  The estimated blood  loss was less than 100 mL.  The patient tolerated the procedure well and  went to the recovery room in satisfactory condition.                                               Miguel Aschoff, M.D.    AR/MEDQ  D:  06/02/2003  T:  06/02/2003  Job:  [id number]

## 2010-09-28 NOTE — Discharge Summary (Signed)
[name], TOUCH             ACCOUNT NO.:  [id number]   MEDICAL RECORD NO.:  [id number]          PATIENT TYPE:  INP   LOCATION:  9318                          FACILITY:  [facility]   PHYSICIAN:  Miguel Aschoff, M.D.       DATE OF BIRTH:  [birth year]   DATE OF ADMISSION:  11/01/2005  DATE OF DISCHARGE:  11/04/2005                                 DISCHARGE SUMMARY   ADMISSION DIAGNOSES:  Chronic abdominal and pelvic pain.   FINAL DIAGNOSES:  Multiple pelvic and abdominal adhesions.   OPERATIONS/PROCEDURES:  Exploratory laparotomy, lysis of adhesions.   BRIEF HISTORY:  The patient is a 70 year old white female whose undergone a  previous hysterectomy and oophorectomy and has had persistent lower  abdominal pain especially in the right. The patient has undergone an  extensive outpatient evaluation including outpatient CT examination and  surgical consultation and no other etiologies for this pain were found. On  prior surgical exploration, she was noted to have extensive abdominal and  pelvic adhesions and due to the persistent symptomatology, she has requested  that something be done in an effort to resolve her pain and was admitted to  the hospital to undergo exploratory laparotomy.   HOSPITAL COURSE:  On November 01, 2005, the patient was taken to the operating  room where under general anesthesia an exploratory laparotomy was carried  out. The patient was noted to have multiple pelvic and abdominal adhesions  involving loops of the small bowel as well as adhesions of the rectosigmoid  into the pelvis as well as omental adhesions. It was possible at the time of  surgery to lyse all these adhesions and then steps were taken using  Interceed and other barrier methods to try to prevent renewed adhesion  formation. The patient's postoperative course was essentially uncomplicated.  She did have tolerate increasing ambulation and diet well. Her blood count  remained stable. She remained afebrile  and by the third postoperative day,  the patient was discharged home. The patient was instructed to do no heavy  lifting, to return to the office to have her staples removed, call if there  are any problems such as fever, pain or incisional problems.  Medications for home included Tylox 1 every 3 h as needed for pain. She was  sent home in satisfactory condition.      Miguel Aschoff, M.D.  Electronically Signed     AR/MEDQ  D:  12/16/2005  T:  12/17/2005  Job:  [id number]

## 2010-09-28 NOTE — Assessment & Plan Note (Signed)
[facility]                              CARDIOLOGY OFFICE NOTE   CARLISE, STOFER                    MRN:          [id number]  DATE:12/16/2005                            DOB:          17-Sep-1940    PRIMARY CARE PHYSICIAN:  Gwendlyn Deutscher, MD.   PRIMARY CARDIOLOGIST:  Rollene Rotunda, MD, [facility]   SUBJECTIVE:  [name] is a 70 year old female patient who was recently  seen by Dr. Antoine Poche secondary to history of valvular abnormality.  He set  her up for an echocardiogram which showed an EF of 60%, a very mild aortic  stenosis with mean gradient of 13 mmHg and mild aortic insufficiency and  mild mitral regurgitation.  She had undergone cardiac catheterization in  1997 that showed normal coronary arteries.  She has a history of chronic  chest pain but denies any symptoms with exertion and no further  cardiovascular testing was warranted for this.  The patient was cleared for  abdominal surgery to lyse adhesions that was causing chronic pain.  This was  done back in June 2007 by Dr. Miguel Aschoff.  She recently developed right  flank pain and underwent stent placement in her right ureter for  hydronephrosis and right ureteropelvic junction obstruction.  This was done  August 2.  She went home the same day she had the procedure.  Two days after  the procedure, the patient noted an increase in shortness of breath.  She  tells me she has had chronic shortness of breath for many years now with  certain situations.  This does not seem to be related to exertion.  She had  difficulty catching her breath the entire night that evening.  She really  describes what sounds like orthopnea.  However, after she felt better she  was able to lay down without any problems.  She denies any syncope since her  urologic procedure.  However, she does explain a symptom of syncope that has  been occurring off and on for years now.  She usually has no warning with  this.   She does admit that it sometimes coincides with increased social  stressors, especially with her son.  Again, she denies any change in her  chest pain that has been chronic.  She denies any shortness of breath with  exertion.  She did have to take quite a bit of pain medications after her  procedure and she decreased the amount of this and had it changed, actually,  the other day.  Since she has done this, her breathing has improved.   CURRENT MEDICATIONS:  1.  Macrobid daily.  2.  Glipizide daily.  3.  Multivitamin daily.  4.  _________  5.  Amoxicillin.  6.  Keflex.  7.  Oxycodone p.r.n.  8.  Hydromorphone p.r.n.   ALLERGIES:  NO KNOWN DRUG ALLERGIES.   PHYSICAL EXAM:  She is a well-nourished, well-developed female in no acute  distress.  Blood pressure is 143/88, pulse is 65, weight 156 pounds.  Oxygen  saturation is 98% on room air.  HEENT:  Unremarkable.  NECK:  Without JVD.  CARDIAC:  S1, S2.  Regular rate and rhythm.  A 2/6 systolic ejection murmur.  LUNGS:  Clear to auscultation bilaterally without wheezing, rhonchi or  rales.  ABDOMEN:  Soft, nontender, with normoactive bowel sounds.  No organomegaly.  EXTREMITIES:  Without edema.  Calves are soft and nontender bilaterally  without palpable cords.   IMPRESSION:  1.  Dyspnea.  2.  Mild aortic stenosis.  3.  History of syncope dating back years.  4.  History of pancreatitis.  5.  Diabetes mellitus.  6.  Status post abdominal surgery in June 2007 for lysis of adhesions      secondary to chronic abdominal and pelvic pain.  7.  Status post recent placement of a ureteral stent for obstruction and      hydronephrosis.   PLAN:  The patient presents with some complaints of dyspnea.  In talking to  her, this is a chronic symptom that has been ongoing for years.  This  actually prompted her cardiac catheterization back in 1997.  However, her  symptoms got much worse on Saturday, two days after her urologic procedure.  She  changed the way she takes her pain medications and her symptoms have  actually improved but not completely resolved.  She seems to be euvolemic on  exam.  She has no objective evidence of ischemia on her echocardiogram.  She  is not tachycardic.  She is not hypoxic.  At this point in time, the  etiology of her dyspnea is unclear.  She denies any wheezing or cough.  She  is a nonsmoker.  At this point in time, I am going to set her up with a BMET  and a BNP.  If her BNP is completely, normal, I think we can reassure her.  She has noted a history of syncope that dates back many years.  She notes  that her last episode of syncope was a little over a week ago.  She had a  recent echocardiogram that showed good LV function.  The likelihood of her  having cardiac arrhythmia causing her syncope is quite low.  I will set her  up with a 30-day event monitor and follow up with Dr. Antoine Poche in he next 3-  4 weeks.  If her event monitor is unrevealing, we may want to consider  sending her to EP for possible tilt table testing.  As noted above, she will  follow up with Dr. Antoine Poche in the next 3-4 weeks or sooner should she  develop a change in her symptoms or worsening symptoms.                                  Tereso Newcomer, PA-C                             Pricilla Riffle, MD, [facility]   SW/MedQ  DD:  12/16/2005  DT:  12/16/2005  Job #:  [id number]   cc:   Durenda Hurt, MD

## 2010-09-28 NOTE — Op Note (Signed)
Emily Miranda, Emily Miranda             ACCOUNT NO.:  [id number]   MEDICAL RECORD NO.:  [id number]          PATIENT TYPE:  AMB   LOCATION:  DAY                          FACILITY:  [facility]   PHYSICIAN:  Bertram Millard. Dahlstedt, M.D.DATE OF BIRTH:  [birth year]   DATE OF PROCEDURE:  12/12/2005  DATE OF DISCHARGE:                                 OPERATIVE REPORT   PREOPERATIVE DIAGNOSIS:  Right flank pain with hydronephrosis.   POSTOPERATIVE DIAGNOSIS:  Right ureteropelvic junction obstruction.   SURGICAL PROCEDURES:  Cystoscopy, right retrograde ureteral pyelogram, right  ureteroscopy, right double-J stent placement.   SURGEON:  Bertram Millard. Dahlstedt, M.D.   ANESTHESIA:  General.   COMPLICATIONS:  None.   BRIEF HISTORY:  A 70 year old female with increasing right flank pain over  the past few weeks to months.  It recently has been quite intolerable.  She  is status post recent gynecologic surgery for pelvic pain.   On CT scan recently, she was found to have right hydronephrosis with  perinephric inflammation.  There is no evidence of tumor or stone in the  vicinity of the right UPJ, but UPJ obstruction was suspected.  She presents  at this time for definitive diagnostic procedure.  We talked to her about  cysto, right retrograde, possible stent placement with ureteroscopy.  She  agrees to this.   DESCRIPTION OF PROCEDURE:  The patient was administered preoperative IV  antibiotics including ampicillin and gentamicin.  Her surgical side was  marked, and she was taken to the operating room where general anesthetic was  administered using LMA.  She was placed in the dorsal lithotomy position.  Genitalia and perineum were prepped and draped.  Cystoscopic view of the  bladder was performed.  There was no evidence of abnormalities within the  bladder.  The right ureter was cannulated with a 6-French open-end catheter.  Retrograde was performed.   The right ureter was normal.  No abnormal  filling defects.  At the UPJ,  there was a narrowing, and there was no evidence of filling defect here.  The renal pelvis filled quite easily, but there was significant  pyelocaliectasis here.  No evident filling defect was seen within the renal  pelvis or the caliceal system.   The ureteroscope was advanced up the ureter and up to the UPJ.  No lesions  were seen in this area.  There were no stones.  The UPJ was fairly hard to  get past, but the pelvis was eventually entered.  No abnormalities were seen  within the renal pelvis.  A guidewire was left behind after the scope was  removed.  A double-J stent (Contour), 24 cm x 5-French was then placed.  There was good decompression of the renal pelvis after this.  The bladder  was drained.  The procedure was terminated.  She was awakened and taken to  PACU in stable condition.   She will be sent home on 4 amoxicillin tablets at 6:00 p.m. tonight, and  will be on Keflex 500 mg p.o. b.i.d. for the next 10 days.  I will follow  her up in 1  week.      Bertram Millard. Dahlstedt, M.D.  Electronically Signed     SMD/MEDQ  D:  12/12/2005  T:  12/12/2005  Job:  [id number]   cc:   Durenda Hurt, M.D.  Fax: [phone]   Miguel Aschoff, M.D.  Fax: [phone]

## 2010-09-28 NOTE — Discharge Summary (Signed)
NAME:  Emily Miranda, Emily Miranda                       ACCOUNT NO.:  [id number]   MEDICAL RECORD NO.:  [id number]                   PATIENT TYPE:  INP   LOCATION:  9317                                 FACILITY:  [facility]   PHYSICIAN:  Miguel Aschoff, M.D.                    DATE OF BIRTH:  1940/06/15   DATE OF ADMISSION:  06/02/2003  DATE OF DISCHARGE:  06/04/2003                                 DISCHARGE SUMMARY   ADMISSION DIAGNOSIS:  Chronic pelvic pain, pelvic adhesions.   FINAL DIAGNOSIS:  Chronic pelvic pain, pelvic adhesions.   OPERATION/PROCEDURE:  Exploratory laparotomy with lysis of adhesions and  bilateral salpingo-oophorectomy under general anesthesia.   BRIEF HISTORY:  The patient is a 70 year old white female status post  previous hysterectomy and appendectomy as well as laparoscopy. The patient,  following her hysterectomy, had persistent left lower quadrant pain and  under laparoscopy was noted to have extensive pelvic adhesions. His  adhesions were lysed via the laparoscope. The patient did have initial  response with improvement of the pain. However, the patient quickly  returned. Because of the recurrence of her pelvic pain, it was thought that  the adhesions had re-formed on the ovaries, and she is now being admitted to  undergo laparotomy with lysis of adhesions and bilateral salpingo-  oophorectomy.   HOSPITAL COURSE:  The patient was admitted. Her admission hemoglobin was  13.5, hematocrit 39.2, white count 6500. Urinalysis was essentially  negative.  On June 02, 2003, under general anesthesia, exploratory  laparotomy was carried out revealing extensive adhesions of the sigmoid to  the inferior bladder dome as well as to the pelvic sidewall on the left as  well as the left tube and ovary with some occasional adhesions. The  adhesions were taken down without difficulty and then bilateral salpingo-  oophorectomy was carried out. The patient tolerated her operative  procedure  well and had an eventual uncomplicated postoperative course, tolerating  increasing ambulation and diet well. Her hemoglobin remained stable. By the  January 22nd she was in satisfactory condition and will be discharged home.   Her medications for home include Tylox one q.3h. as needed for pain. She is  instructed in no heavy lifting, to place nothing in the vagina, and to  return the office on January 25th for removal of her staples. She was sent  home in satisfactory condition on a regular diet.                                               Miguel Aschoff, M.D.    AR/MEDQ  D:  06/30/2003  T:  07/01/2003  Job:  [id number]

## 2010-09-28 NOTE — Op Note (Signed)
NAME:  CHRISTON, GALLAWAY                       ACCOUNT NO.:  [id number]   MEDICAL RECORD NO.:  [id number]                   PATIENT TYPE:  AMB   LOCATION:  SDC                                  FACILITY:  [facility]   PHYSICIAN:  Miguel Aschoff, M.D.                    DATE OF BIRTH:  [birth year]   DATE OF PROCEDURE:  01/04/2003  DATE OF DISCHARGE:                                 OPERATIVE REPORT   PREOPERATIVE DIAGNOSIS:  Pelvic pain.   POSTOPERATIVE DIAGNOSIS:  Extensive pelvic adhesions.   PROCEDURES:  1. Diagnostic laparoscopy.  2. Lysis of adhesions.   SURGEON:  Miguel Aschoff, M.D.   ANESTHESIA:  General.   COMPLICATIONS:  None.   JUSTIFICATION:  The patient is a 70 year old white female with a several  month history of persistent left lower quadrant and pelvic pain.  The  patient has undergone evaluation with both ultrasound and colonoscopy and no  definite etiology for the pain could be established.  She presents now to  undergo laparoscopy to see if an etiology for the pain can be found and  corrected.  The risks and benefits of the procedure were discussed with the  patient.   DESCRIPTION OF PROCEDURE:  The patient was taken to the operating room,  placed in the supine position, and general anesthesia was administered  without difficulty.  She was then placed in the dorsal lithotomy position,  prepped and draped in the usual sterile fashion.  A Foley catheter was  inserted in the bladder.   At this point a small infraumbilical incision was made, a Veress needle was  inserted, and the abdomen was insufflated with 3 L of CO2.  Following  insufflation, the trocar to the laparoscope was placed followed by the  laparoscope itself.  Then two additional ports were established; one in the  midline suprapubically being a 5 mm port established under direct  visualization as well as a 5 mm left lower quadrant port.   Inspection to this point revealed adhesions of the omentum to the  anterior  abdominal wall and the site of the previous laparotomy.  In addition, the  cul-de-sac was obliterated by adhesions of the appendices epiploica, of the  sigmoid colon adherent to the bladder dome.  The left tube and ovary and  right tube and ovary were not possible to be visualized initially.  At this  point, laparoscopic scissors were placed in the operating channel of the  laparoscope and the anterior abdominal adhesions were taken down without  difficulty and with good hemostasis.  It was possible to free the omentum  off the anterior abdominal wall at the center of the previous incision.   Then the adhesions holding the sigmoid colon to the right tube and ovary and  bladder dome were placed on stretch and with avascular regions being found,  the appendices epiploica and sigmoid colon were totally freed  off the right  tube and ovary and bladder dome.  Multiple additional adhesions were holding  the sigmoid colon to the lateral retropelvic side wall.  Again, these  adhesions were placed on stretch and again in avascular regions, all these  adhesions were taken down and it was possible to visualize the left tube and  ovary.  The distal end of the left tube was adherent to the sigmoid colon  and again with care, it was possible to dissect the tube off of the sigmoid  colon and free the tube and ovary totally from the adhesions; the anatomy  thus being restored to a normal configuration.   The abdomen was irrigated with saline and Nezhat suction aspirating it.  Hemostasis appeared to excellent.  With the anatomy now restored to a normal  configuration, it was elected to complete the procedure.  The CO2 was  allowed to escape.  All instruments were removed and the small incisions  were closed using a subcuticular of 4-0 Vicryl.  The estimated blood loss  was less than 20 mL.   The patient tolerated the procedure well and went to the recovery room in  satisfactory condition.    The plans were for the patient to be discharged to home.  Check clinical  response to lysis of adhesions.  Medications for home include Tylox one q.4  h. as needed for pain and Cipro 250 mg b.i.d. x3 days.  The patient will be  seen back in four weeks for a followup examination.  She should call if  there are any problems such as severe pain or heavy bleeding.                                               Miguel Aschoff, M.D.    AR/MEDQ  D:  01/04/2003  T:  01/04/2003  Job:  [id number]

## 2010-09-28 NOTE — Op Note (Signed)
NAMEKENISE, Miranda             ACCOUNT NO.:  [id number]   MEDICAL RECORD NO.:  [id number]          PATIENT TYPE:  INP   LOCATION:  9399                          FACILITY:  [facility]   PHYSICIAN:  Miguel Aschoff, M.D.       DATE OF BIRTH:  [birth year]   DATE OF PROCEDURE:  11/01/2005  DATE OF DISCHARGE:                                 OPERATIVE REPORT   PREOP DIAGNOSES:  1.  Chronic abdominal and pelvic pain.  2.  Probable pelvic adhesions.   POSTOP DIAGNOSES:  Multiple pelvic adhesions.   PROCEDURES:  1.  Exploratory laparotomy.  2.  Lysis of adhesions.   SURGEON:  Miguel Aschoff, M.D.   ASSISTANT:  Leonie Man, M.D.   ANESTHESIA:  General.   COMPLICATIONS:  None.   JUSTIFICATION:  Patient is a 70 year old white female, status post previous  hysterectomy and oophorectomy who has had persistent lower abdominal pain;  the patient's claims unrelenting.  The patient has undergone extensive  evaluation including outpatient CT scan surgical consultation and other  etiologies of the pain have been ruled out except for pelvic adhesions which  had been previously noted on prior laparotomy.  Due to her symptomatology,  she presents now to undergo exploratory laparotomy with lysis of adhesions.  The patient understands that this will hopefully improve her pain, but that  this cannot be guaranteed; and that adhesions can reform.   DESCRIPTION OF PROCEDURE:  The patient was taken to the operating room,  placed in supine position; and general anesthesia was administered without  difficulty.  She was then prepped and draped in usual sterile fashion.  A  Foley catheter was inserted.  At this point a midline incision was made;  from the umbilicus of the symphysis pubis.  It was extended down through the  subcutaneous tissue with bleeding points being clamped and coagulated as  they were encountered.  The fascia was then identified and incised  vertically.  After this was done, the rectus muscles  were divided.  The  peritoneum was found and entered carefully underlying structures.  From the  area of approximately 4 cm below the umbilicus to the symphysis, omental  adhesions were present on the anterior abdominal wall.  These were taken  down without difficulty; and bleeding points clamped and ligated as they  were encountered.   The omentum was then totally freed off the anterior abdominal wall.  Inspection was then carried out on the pelvis.  The small intestines  appeared to be within normal limits.  It was noted that the sigmoid colon  was adherent to the bladder dome and the cul-de-sac; and there were  adhesions of the cecum in the right lower quadrant.  There was no evidence  of any hernias present.  No masses were found.  Her upper abdomen was  unremarkable.  At this point, all the adhesions were placed on stretch,  planes were found; and then the adhesions were taken down to avoid any  injury to the bladder or the bowels.  It was possible to essentially lyse  all the adhesions involving the sigmoid  colon, cecum, and all the adhesions  involving the bowel to the bladder dome.  With no other adhesions present,  an effort was made to prevent further adhesion formation by placing  Seprafilm between the sigmoid colon and bladder dome.  An additional piece  of intercede was placed, again, to prevent adhesion formation.  At this  point with excellent hemostasis, lap counts and instrument counts were taken  and found to be correct; and then the abdomen was closed.  The  parietoperitoneum and muscles were closed using running interlocking #0 PDS  suture.  Two sutures were used, each starting at the angles of the fascial  incisions, and meeting in midline.  Subcutaneous tissue was closed using  staples.  Estimated blood loss was minimal.  At this point, the patient was  reversed from the anesthetic and brought to the recovery room in  satisfactory condition.  The patient will be  hospitalized as an in patient.      Miguel Aschoff, M.D.  Electronically Signed     AR/MEDQ  D:  11/01/2005  T:  11/01/2005  Job:  [id number]   cc:   Leonie Man, M.D.  [address]  [address]  [place]  [place] [address]

## 2010-09-28 NOTE — Letter (Signed)
December 10, 2007    Lennie Muckle, MD  [address] [place],  [place], [place] [address].   RE:  Emily Miranda, Emily Miranda  MRN:  [id number]  /  DOB:  September 09, 1940   Dear Dr. Freida Busman,   Ms. Solberg was recently seen in our cardiology clinic for preoperative  clearance.  She has a history of mild aortic stenosis.  She also has  diabetes mellitus.  Given this, Dr. [name] sent her for a stress  perfusion study.  This demonstrated EF of 67% with no evidence of  ischemia or infarct.  Followup of her aortic stenosis with an  echocardiogram, which demonstrated mild aortic stenosis and a well-  preserved ejection fraction.  She had some mildly elevated pulmonary  pressures.  Given this, the patient is at acceptable risk for the  planned surgery according to ACC/AHA guidelines.  No further  cardiovascular testing is suggested.  I would monitor her volume status  carefully given the mild aortic stenosis.  If you have any questions  about this consultation, please do not hesitate to give me a call.  My  cell phone number is [phone].    Sincerely,      Rollene Rotunda, MD, [facility]  Electronically Signed    JH/MedQ  DD: 12/10/2007  DT: 12/11/2007  Job #: [id number]

## 2010-11-27 ENCOUNTER — Encounter: Payer: Self-pay | Admitting: Cardiology

## 2010-12-25 ENCOUNTER — Encounter: Payer: Self-pay | Admitting: Cardiology

## 2010-12-25 ENCOUNTER — Ambulatory Visit (INDEPENDENT_AMBULATORY_CARE_PROVIDER_SITE_OTHER): Payer: Medicare Other | Admitting: Cardiology

## 2010-12-25 DIAGNOSIS — I10 Essential (primary) hypertension: Secondary | ICD-10-CM

## 2010-12-25 DIAGNOSIS — I359 Nonrheumatic aortic valve disorder, unspecified: Secondary | ICD-10-CM

## 2010-12-25 DIAGNOSIS — I110 Hypertensive heart disease with heart failure: Secondary | ICD-10-CM | POA: Insufficient documentation

## 2010-12-25 DIAGNOSIS — R079 Chest pain, unspecified: Secondary | ICD-10-CM

## 2010-12-25 NOTE — Patient Instructions (Signed)
Follow up in 1 year with Dr Hochrein.  You will receive a letter in the mail 2 months before you are due.  Please call us when you receive this letter to schedule your follow up appointment.  The current medical regimen is effective;  continue present plan and medications.  

## 2010-12-25 NOTE — Assessment & Plan Note (Signed)
She has had no new symptoms since her stress test in 09.  No further work up is needed.

## 2010-12-25 NOTE — Progress Notes (Signed)
HPI The patient presents for one year follow up.  Since I last saw her she has had no new complaints.  She rarely gets chest pain and has taken no NTG in the last 12 months.  The patient denies any new symptoms such as neck or arm discomfort. There has been no new shortness of breath, PND or orthopnea. There have been no reported palpitations, presyncope or syncope.  She is limited in activities with foot pain related to neuropathy, charcot joint.   Allergies  Allergen Reactions  . Levofloxacin     Current Outpatient Prescriptions  Medication Sig Dispense Refill  . Ascorbic Acid (VITAMIN C) 100 MG tablet Take 100 mg by mouth daily.        Marland Kitchen aspirin 81 MG tablet Take 81 mg by mouth daily.        . celecoxib (CELEBREX) 200 MG capsule Take 200 mg by mouth daily.        . felodipine (PLENDIL) 5 MG 24 hr tablet Take 5 mg by mouth daily.        Marland Kitchen GLIMEPIRIDE PO Take 2.5 mg by mouth 2 (two) times daily.        . hydrochlorothiazide (HYDRODIURIL) 12.5 MG tablet Take 12.5 mg by mouth daily.        . metFORMIN (GLUCOPHAGE) 1000 MG tablet Take 1,000 mg by mouth 2 (two) times daily with a meal.        . nitrofurantoin, macrocrystal-monohydrate, (MACROBID) 100 MG capsule Take 100 mg by mouth at bedtime.        Marland Kitchen omeprazole (PRILOSEC) 20 MG capsule Take 20 mg by mouth daily.        . pravastatin (PRAVACHOL) 20 MG tablet Take 80 mg by mouth daily.       . traMADol (ULTRAM) 50 MG tablet Take 50 mg by mouth as needed.          Past Medical History  Diagnosis Date  . Diabetes mellitus   . Pancreatitis   . Aortic stenosis, mild     Past Surgical History  Procedure Date  . Removal of ovarian cyst   . Vesicovaginal fistula closure w/ tah   . Tonsillectomy   . Exploratory laparotomy and adhesion lysis   . 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 Ventral hernia repair     ROS:  Foot pain.  Otherwise as stated in the HPI and negative for all other  systems.  PHYSICAL EXAM BP 144/78  Pulse 52  Resp 16  Ht 5\' 6"  (1.676 m)  Wt 166 lb (75.297 kg)  BMI 26.79 kg/m2 GENERAL:  Well appearing HEENT:  Pupils equal round and reactive, fundi not visualized, oral mucosa unremarkable NECK:  No jugular venous distention, waveform within normal limits, carotid upstroke brisk and symmetric, no bruits, no thyromegaly LYMPHATICS:  No cervical, inguinal adenopathy LUNGS:  Clear to auscultation bilaterally BACK:  No CVA tenderness CHEST:  Unremarkable HEART:  PMI not displaced or sustained,S1 and S2 within normal limits, no S3, no S4, no clicks, no rubs.  Apical early peaking systolic murmur heard out the aortic outflow tract. ABD:  Flat, positive bowel sounds normal in frequency in pitch, no bruits, no rebound, no guarding, no midline pulsatile mass, no hepatomegaly, no splenomegaly EXT:  2 plus pulses throughout, no edema, no cyanosis no clubbing SKIN:  No rashes no nodules NEURO:  Cranial nerves II through XII grossly intact, motor grossly intact throughout PSYCH:  Cognitively intact, oriented  to person place and time   EKG:  Sinus bradycardia, rate 52 with PACs.  No acute ST T wave changes.  ASSESSMENT AND PLAN

## 2010-12-25 NOTE — Assessment & Plan Note (Signed)
She has had no change in her symptoms or exam since her last echo. I would not suspect an ASD versus mild. I will see her again in 1 year.

## 2010-12-25 NOTE — Assessment & Plan Note (Signed)
The blood pressure is at target. No change in medications is indicated. We will continue with therapeutic lifestyle changes (TLC).  

## 2011-02-20 LAB — URINALYSIS, ROUTINE W REFLEX MICROSCOPIC
Bilirubin Urine: NEGATIVE
Glucose, UA: NEGATIVE
Hgb urine dipstick: NEGATIVE
Ketones, ur: NEGATIVE
Nitrite: NEGATIVE
Protein, ur: 30 — AB
Specific Gravity, Urine: 1.02
Urobilinogen, UA: 0.2
pH: 6

## 2011-02-20 LAB — BASIC METABOLIC PANEL
BUN: 11
BUN: 12
BUN: 15
CO2: 25
CO2: 27
CO2: 28
Calcium: 8.8
Calcium: 8.9
Calcium: 9.8
Chloride: 106
Chloride: 106
Chloride: 107
Creatinine, Ser: 0.87
Creatinine, Ser: 0.88
Creatinine, Ser: 0.95
GFR calc Af Amer: >60
GFR calc Af Amer: >60
GFR calc Af Amer: >60
GFR calc non Af Amer: 59 — ABNORMAL LOW
GFR calc non Af Amer: >60
GFR calc non Af Amer: >60
Glucose, Bld: 103 — ABNORMAL HIGH
Glucose, Bld: 107 — ABNORMAL HIGH
Glucose, Bld: 215 — ABNORMAL HIGH
Potassium: 4.1
Potassium: 4.2
Potassium: 4.9
Sodium: 139
Sodium: 139
Sodium: 140

## 2011-02-20 LAB — ABO/RH: ABO/RH(D): O POS

## 2011-02-20 LAB — CBC
HCT: 34.7 — ABNORMAL LOW
Hemoglobin: 11.9 — ABNORMAL LOW
MCHC: 34.2
MCV: 89.1
Platelets: 244
RBC: 3.89
RDW: 13.4
WBC: 6.9

## 2011-02-20 LAB — TYPE AND SCREEN
ABO/RH(D): O POS
Antibody Screen: NEGATIVE

## 2011-02-20 LAB — HEMOGLOBIN AND HEMATOCRIT, BLOOD
HCT: 31.3 — ABNORMAL LOW
HCT: 32.8 — ABNORMAL LOW
Hemoglobin: 10.6 — ABNORMAL LOW
Hemoglobin: 11.2 — ABNORMAL LOW

## 2011-02-20 LAB — CREATININE, FLUID (PLEURAL, PERITONEAL, JP DRAINAGE): Creat, Fluid: 0.9

## 2011-02-20 LAB — URINE MICROSCOPIC-ADD ON

## 2011-05-13 ENCOUNTER — Other Ambulatory Visit (HOSPITAL_COMMUNITY): Payer: Self-pay | Admitting: Urology

## 2011-05-13 DIAGNOSIS — N135 Crossing vessel and stricture of ureter without hydronephrosis: Secondary | ICD-10-CM

## 2011-05-28 ENCOUNTER — Encounter (HOSPITAL_COMMUNITY)
Admission: RE | Admit: 2011-05-28 | Discharge: 2011-05-28 | Disposition: A | Discharge status: Home / Self Care | Payer: Medicare Other | Source: Ambulatory Visit | Attending: Urology | Admitting: Urology

## 2011-05-28 DIAGNOSIS — N135 Crossing vessel and stricture of ureter without hydronephrosis: Secondary | ICD-10-CM | POA: Insufficient documentation

## 2011-05-28 DIAGNOSIS — N1330 Unspecified hydronephrosis: Secondary | ICD-10-CM | POA: Insufficient documentation

## 2011-05-28 DIAGNOSIS — N289 Disorder of kidney and ureter, unspecified: Secondary | ICD-10-CM | POA: Insufficient documentation

## 2011-05-28 MED ORDER — FUROSEMIDE 10 MG/ML IJ SOLN
40.0000 mg | INTRAMUSCULAR | Status: DC
  Filled 2011-05-28: qty 4

## 2011-05-28 MED ORDER — TECHNETIUM TC 99M MERTIATIDE
15.4000 | Freq: Once | INTRAVENOUS | Status: AC | PRN
Start: 2011-05-28 — End: 2011-05-28
  Administered 2011-05-28: 15 via INTRAVENOUS

## 2011-06-20 ENCOUNTER — Other Ambulatory Visit: Payer: Self-pay | Admitting: Obstetrics and Gynecology

## 2011-06-20 DIAGNOSIS — R1031 Right lower quadrant pain: Secondary | ICD-10-CM

## 2011-06-24 ENCOUNTER — Ambulatory Visit
Admission: RE | Admit: 2011-06-24 | Discharge: 2011-06-24 | Disposition: A | Discharge status: Home / Self Care | Payer: Medicare Other | Source: Ambulatory Visit | Attending: Obstetrics and Gynecology | Admitting: Obstetrics and Gynecology

## 2011-06-24 DIAGNOSIS — R1031 Right lower quadrant pain: Secondary | ICD-10-CM

## 2011-06-24 MED ORDER — IOHEXOL 300 MG/ML  SOLN
100.0000 mL | Freq: Once | INTRAMUSCULAR | Status: AC | PRN
  Administered 2011-06-24: 100 mL via INTRAVENOUS

## 2011-12-30 ENCOUNTER — Ambulatory Visit: Payer: Medicare Other | Admitting: Cardiology

## 2012-02-04 ENCOUNTER — Ambulatory Visit: Payer: Medicare Other | Admitting: Cardiology

## 2012-03-05 ENCOUNTER — Ambulatory Visit: Payer: Medicare Other | Admitting: Physician Assistant

## 2012-04-01 ENCOUNTER — Other Ambulatory Visit (HOSPITAL_COMMUNITY): Payer: Self-pay | Admitting: Urology

## 2012-04-01 ENCOUNTER — Ambulatory Visit: Payer: Medicare Other | Admitting: Cardiology

## 2012-04-01 DIAGNOSIS — N135 Crossing vessel and stricture of ureter without hydronephrosis: Secondary | ICD-10-CM

## 2012-04-20 ENCOUNTER — Encounter: Payer: Self-pay | Admitting: Cardiology

## 2012-04-20 ENCOUNTER — Ambulatory Visit (INDEPENDENT_AMBULATORY_CARE_PROVIDER_SITE_OTHER): Payer: Medicare Other | Admitting: Cardiology

## 2012-04-20 VITALS — BP 159/75 | HR 50 | Ht 65.0 in | Wt 155.4 lb

## 2012-04-20 DIAGNOSIS — I359 Nonrheumatic aortic valve disorder, unspecified: Secondary | ICD-10-CM

## 2012-04-20 DIAGNOSIS — R079 Chest pain, unspecified: Secondary | ICD-10-CM

## 2012-04-20 DIAGNOSIS — I10 Essential (primary) hypertension: Secondary | ICD-10-CM

## 2012-04-20 NOTE — Progress Notes (Signed)
HPI The patient presents for one year follow up of aortic stenosis..  Since I last saw her she has had surgery for repair of rectal mucosal prolapse. While hospitalized at [facility] she developed a viral illness. This caused severe GI symptoms that lasted for greater than 5 weeks. She has finally recovered from this. She does occasionally get some chest discomfort. She says this is the same she has described previously. She says she gets some lower discomfort and occasional upper discomfort and confuses this time with her pancreas symptoms. She has occasional shortness of breath but she's not describing PND or orthopnea. She's not describing palpitations, presyncope or syncope. She's had no weight gain. However, she has had some mild chronic lower extremity edema.  Allergies  Allergen Reactions  . Levofloxacin     Current Outpatient Prescriptions  Medication Sig Dispense Refill  . Ascorbic Acid (VITAMIN C) 100 MG tablet Take 100 mg by mouth daily.        Marland Kitchen aspirin 81 MG tablet Take 81 mg by mouth daily.        . celecoxib (CELEBREX) 200 MG capsule Take 200 mg by mouth as needed.       . felodipine (PLENDIL) 5 MG 24 hr tablet Take 5 mg by mouth daily.        Marland Kitchen GLIMEPIRIDE PO Take 2.5 mg by mouth 2 (two) times daily.        . metFORMIN (GLUCOPHAGE) 1000 MG tablet Take 1,000 mg by mouth 2 (two) times daily with a meal.        . nitrofurantoin, macrocrystal-monohydrate, (MACROBID) 100 MG capsule Take 100 mg by mouth at bedtime.        Marland Kitchen omeprazole (PRILOSEC) 20 MG capsule Take 20 mg by mouth daily.        . pravastatin (PRAVACHOL) 20 MG tablet Take 80 mg by mouth daily.       . traMADol (ULTRAM) 50 MG tablet Take 50 mg by mouth as needed.        . hydrochlorothiazide (HYDRODIURIL) 12.5 MG tablet Take 12.5 mg by mouth daily.          Past Medical History  Diagnosis Date  . Diabetes mellitus   . Pancreatitis   . Aortic stenosis, mild   . HTN (hypertension)   . Neuropathy   . Charcot foot due  to diabetes mellitus     Past Surgical History  Procedure Date  . Removal of ovarian cyst   . Vesicovaginal fistula closure w/ tah   . Tonsillectomy   . Exploratory laparotomy and adhesion lysis   . 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 Ventral hernia repair     ROS:  Foot pain.  Otherwise as stated in the HPI and negative for all other systems.  PHYSICAL EXAM BP 159/75  Pulse 50  Ht 5\' 5"  (1.651 m)  Wt 155 lb 6.4 oz (70.489 kg)  BMI 25.86 kg/m2 GENERAL:  Well appearing HEENT:  Pupils equal round and reactive, fundi not visualized, oral mucosa unremarkable NECK:  No jugular venous distention, waveform within normal limits, carotid upstroke brisk and symmetric, no bruits, no thyromegaly LYMPHATICS:  No cervical, inguinal adenopathy LUNGS:  Clear to auscultation bilaterally BACK:  No CVA tenderness CHEST:  Unremarkable HEART:  PMI not displaced or sustained,S1 and S2 within normal limits, no S3, no S4, no clicks, no rubs.  Apical early peaking systolic murmur heard out the aortic outflow tract. ABD:  Flat, positive bowel sounds normal in frequency in pitch, no bruits, no rebound, no guarding, no midline pulsatile mass, no hepatomegaly, no splenomegaly EXT:  2 plus pulses throughout, no edema, no cyanosis no clubbing SKIN:  No rashes no nodules NEURO:  Cranial nerves II through XII grossly intact, motor grossly intact throughout PSYCH:  Cognitively intact, oriented to person place and time   EKG:  Sinus bradycardia, rate 50, no acute ST T wave changes.  04/20/2012  ASSESSMENT AND PLAN  HTN (hypertension) -  The blood pressure is not at target. However, this is actively being managed by [facility] Valrie Hart., MD. She has had recent Changes to her m so I will not interfere with this management.edications  CHEST PAIN -  This doesn't seem to have changed since her previous stress test. Therefore, no further cardiovascular testing is suggested.    AORTIC STENOSIS -  I did review the results of previous echoes. Her murmur sounds like moderate stenosis and so I will repeat an echocardiogram this year. Further evaluation will be based on these results.

## 2012-04-20 NOTE — Patient Instructions (Addendum)

## 2012-04-28 ENCOUNTER — Ambulatory Visit (HOSPITAL_COMMUNITY): Payer: Medicare Other | Attending: Cardiology | Admitting: Radiology

## 2012-04-28 DIAGNOSIS — R609 Edema, unspecified: Secondary | ICD-10-CM | POA: Insufficient documentation

## 2012-04-28 DIAGNOSIS — I517 Cardiomegaly: Secondary | ICD-10-CM | POA: Insufficient documentation

## 2012-04-28 DIAGNOSIS — I10 Essential (primary) hypertension: Secondary | ICD-10-CM | POA: Insufficient documentation

## 2012-04-28 DIAGNOSIS — I359 Nonrheumatic aortic valve disorder, unspecified: Secondary | ICD-10-CM | POA: Insufficient documentation

## 2012-04-28 DIAGNOSIS — R079 Chest pain, unspecified: Secondary | ICD-10-CM | POA: Insufficient documentation

## 2012-04-28 NOTE — Progress Notes (Signed)
Echocardiogram performed.  

## 2012-06-01 ENCOUNTER — Encounter (HOSPITAL_COMMUNITY)
Admission: RE | Admit: 2012-06-01 | Discharge: 2012-06-01 | Disposition: A | Discharge status: Home / Self Care | Payer: Medicare Other | Source: Ambulatory Visit | Attending: Urology | Admitting: Urology

## 2012-06-01 DIAGNOSIS — N135 Crossing vessel and stricture of ureter without hydronephrosis: Secondary | ICD-10-CM | POA: Insufficient documentation

## 2012-06-01 MED ORDER — FUROSEMIDE 10 MG/ML IJ SOLN
40.0000 mg | Freq: Once | INTRAMUSCULAR | Status: DC
  Filled 2012-06-01: qty 4

## 2012-06-01 MED ORDER — TECHNETIUM TC 99M MERTIATIDE
16.0000 | Freq: Once | INTRAVENOUS | Status: AC | PRN
  Administered 2012-06-01: 16 via INTRAVENOUS

## 2013-04-20 ENCOUNTER — Ambulatory Visit: Payer: Medicare Other | Admitting: Cardiology

## 2013-06-02 ENCOUNTER — Encounter (INDEPENDENT_AMBULATORY_CARE_PROVIDER_SITE_OTHER): Payer: Self-pay

## 2013-06-02 ENCOUNTER — Encounter (INDEPENDENT_AMBULATORY_CARE_PROVIDER_SITE_OTHER): Payer: Self-pay | Admitting: Surgery

## 2013-06-02 ENCOUNTER — Ambulatory Visit (INDEPENDENT_AMBULATORY_CARE_PROVIDER_SITE_OTHER): Payer: Medicare Other | Admitting: Surgery

## 2013-06-02 VITALS — BP 118/74 | HR 52 | Temp 98.1°F | Resp 14 | Ht 65.0 in | Wt 163.0 lb

## 2013-06-02 DIAGNOSIS — N390 Urinary tract infection, site not specified: Secondary | ICD-10-CM | POA: Insufficient documentation

## 2013-06-02 DIAGNOSIS — N393 Stress incontinence (female) (male): Secondary | ICD-10-CM | POA: Insufficient documentation

## 2013-06-02 DIAGNOSIS — K5900 Constipation, unspecified: Secondary | ICD-10-CM

## 2013-06-02 DIAGNOSIS — R1031 Right lower quadrant pain: Secondary | ICD-10-CM

## 2013-06-02 DIAGNOSIS — K5909 Other constipation: Secondary | ICD-10-CM

## 2013-06-02 MED ORDER — NAPROXEN 500 MG PO TABS: 500.0000 mg | ORAL_TABLET | Freq: Two times a day (BID) | ORAL | Status: DC

## 2013-06-02 MED ORDER — METHOCARBAMOL 500 MG PO TABS: 750.0000 mg | ORAL_TABLET | Freq: Three times a day (TID) | ORAL | Status: DC | PRN

## 2013-06-02 NOTE — Patient Instructions (Signed)
The pain in her right lower abdomen and seems more of an abdominal wall strain/soreness.  Use naproxen and heat continuously for the next 3 weeks.  Methocarbamol for breakthrough muscle soreness.  Hopefully that will resolve the pain.  If not, please call us.  He may benefit from further medicines/injections/studies/surgeries to help diagnose/treat your problem.  Managing Pain  Pain after surgery or related to activity is often due to strain/injury to muscle, tendon, nerves and/or incisions.  This pain is usually short-term and will improve in a few months.   Many people find it helpful to do the following things TOGETHER to help speed the process of healing and to get back to regular activity more quickly:  1. Avoid heavy physical activity a.  no lifting greater than 20 pounds b. Do not "push through" the pain.  Listen to your body and avoid positions and maneuvers than reproduce the pain c. Walking is okay as tolerated, but go slowly and stop when getting sore.  d. Remember: If it hurts to do it, then don't do it! 2. Take Anti-inflammatory medication  a. Take with food/snack around the clock for 1-2 weeks i. This helps the muscle and nerve tissues become less irritable and calm down faster b. Choose ONE of the following over-the-counter medications: i. Naproxen 500mg  tabs (ex. Aleve) 1-2 pills twice a day   3. Use a Heating pad or Ice/Cold Pack a. 4-6 times a day b. May use warm bath/hottub  or showers 4. Try Gentle Massage and/or Stretching  a. at the area of pain many times a day b. stop if you feel pain - do not overdo it  Try these steps together to help you body heal faster and avoid making things get worse.  Doing just one of these things may not be enough.    If you are not getting better after two weeks or are noticing you are getting worse, contact our office for further advice; we may need to re-evaluate you & see what other things we can do to help.  Muscle Strain A  muscle strain is an injury that occurs when a muscle is stretched beyond its normal length. Usually a small number of muscle fibers are torn when this happens. Muscle strain is rated in degrees. First-degree strains have the least amount of muscle fiber tearing and pain. Second-degree and third-degree strains have increasingly more tearing and pain.  Usually, recovery from muscle strain takes 1 2 weeks. Complete healing takes 5 6 weeks.  CAUSES  Muscle strain happens when a sudden, violent force placed on a muscle stretches it too far. This may occur with lifting, sports, or a fall.  RISK FACTORS Muscle strain is especially common in athletes.  SIGNS AND SYMPTOMS At the site of the muscle strain, there may be:  Pain.  Bruising.  Swelling.  Difficulty using the muscle due to pain or lack of normal function. DIAGNOSIS  Your health care provider will perform a physical exam and ask about your medical history. TREATMENT  Often, the best treatment for a muscle strain is resting, icing, and applying cold compresses to the injured area.  HOME CARE INSTRUCTIONS   Use the PRICE method of treatment to promote muscle healing during the first 2 3 days after your injury. The PRICE method involves:  Protecting the muscle from being injured again.  Restricting your activity and resting the injured body part.  Icing your injury. To do this, put ice in a plastic bag. Place a  towel between your skin and the bag. Then, apply the ice and leave it on from 15 20 minutes each hour. After the third day, switch to moist heat packs.  Apply compression to the injured area with a splint or elastic bandage. Be careful not to wrap it too tightly. This may interfere with blood circulation or increase swelling.  Elevate the injured body part above the level of your heart as often as you can.  Only take over-the-counter or prescription medicines for pain, discomfort, or fever as directed by your health care  provider.  Warming up prior to exercise helps to prevent future muscle strains. SEEK MEDICAL CARE IF:   You have increasing pain or swelling in the injured area.  You have numbness, tingling, or a significant loss of strength in the injured area. MAKE SURE YOU:   Understand these instructions.  Will watch your condition.  Will get help right away if you are not doing well or get worse. Document Released: 04/29/2005 Document Revised: 02/17/2013 Document Reviewed: 11/26/2012 [facility] Patient Information 2014 [place], [place].   GETTING TO GOOD BOWEL HEALTH. Irregular bowel habits such as constipation and diarrhea can lead to many problems over time.  Having one soft bowel movement a day is the most important way to prevent further problems.  The anorectal canal is designed to handle stretching and feces to safely manage our ability to get rid of solid waste (feces, poop, stool) out of our body.  BUT, hard constipated stools can act like ripping concrete bricks and diarrhea can be a burning fire to this very sensitive area of our body, causing inflamed hemorrhoids, anal fissures, increasing risk is perirectal abscesses, abdominal pain/bloating, an making irritable bowel worse.     The goal: ONE SOFT BOWEL MOVEMENT A DAY!  To have soft, regular bowel movements:    Drink at least 8 tall glasses of water a day.     Take plenty of fiber.  Fiber is the undigested part of plant food that passes into the colon, acting s "natures broom" to encourage bowel motility and movement.  Fiber can absorb and hold large amounts of water. This results in a larger, bulkier stool, which is soft and easier to pass. Work gradually over several weeks up to 6 servings a day of fiber (25g a day even more if needed) in the form of: o Vegetables -- Root (potatoes, carrots, turnips), leafy green (lettuce, salad greens, celery, spinach), or cooked high residue (cabbage, broccoli, etc) o Fruit -- Fresh (unpeeled skin & pulp),  Dried (prunes, apricots, cherries, etc ),  or stewed ( applesauce)  o Whole grain breads, pasta, etc (whole wheat)  o Bran cereals    Bulking Agents -- This type of water-retaining fiber generally is easily obtained each day by one of the following:  o Psyllium bran -- The psyllium plant is remarkable because its ground seeds can retain so much water. This product is available as Metamucil, Konsyl, Effersyllium, Per Diem Fiber, or the less expensive generic preparation in drug and health food stores. Although labeled a laxative, it really is not a laxative.  o Methylcellulose -- This is another fiber derived from wood which also retains water. It is available as Citrucel. o Polyethylene Glycol - and "artificial" fiber commonly called Miralax or Glycolax.  It is helpful for people with gassy or bloated feelings with regular fiber o Flax Seed - a less gassy fiber than psyllium   No reading or other relaxing activity while on  the toilet. If bowel movements take longer than 5 minutes, you are too constipated   AVOID CONSTIPATION.  High fiber and water intake usually takes care of this.  Sometimes a laxative is needed to stimulate more frequent bowel movements, but    Laxatives are not a good long-term solution as it can wear the colon out. o Osmotics (Milk of Magnesia, Fleets phosphosoda, Magnesium citrate, MiraLax, GoLytely) are safer than  o Stimulants (Senokot, Castor Oil, Dulcolax, Ex Lax)    o Do not take laxatives for more than 7days in a row.    IF SEVERELY CONSTIPATED, try a Bowel Retraining Program: o Do not use laxatives.  o Eat a diet high in roughage, such as bran cereals and leafy vegetables.  o Drink six (6) ounces of prune or apricot juice each morning.  o Eat two (2) large servings of stewed fruit each day.  o Take one (1) heaping tablespoon of a psyllium-based bulking agent twice a day. Use sugar-free sweetener when possible to avoid excessive calories.  o Eat a normal breakfast.   o Set aside 15 minutes after breakfast to sit on the toilet, but do not strain to have a bowel movement.  o If you do not have a bowel movement by the third day, use an enema and repeat the above steps.    Controlling diarrhea o Switch to liquids and simpler foods for a few days to avoid stressing your intestines further. o Avoid dairy products (especially milk & ice cream) for a short time.  The intestines often can lose the ability to digest lactose when stressed. o Avoid foods that cause gassiness or bloating.  Typical foods include beans and other legumes, cabbage, broccoli, and dairy foods.  Every person has some sensitivity to other foods, so listen to our body and avoid those foods that trigger problems for you. o Adding fiber (Citrucel, Metamucil, psyllium, Miralax) gradually can help thicken stools by absorbing excess fluid and retrain the intestines to act more normally.  Slowly increase the dose over a few weeks.  Too much fiber too soon can backfire and cause cramping & bloating. o Probiotics (such as active yogurt, Align, etc) may help repopulate the intestines and colon with normal bacteria and calm down a sensitive digestive tract.  Most studies show it to be of mild help, though, and such products can be costly. o Medicines:   Bismuth subsalicylate (ex. Kayopectate, Pepto Bismol) every 30 minutes for up to 6 doses can help control diarrhea.  Avoid if pregnant.   Loperamide (Immodium) can slow down diarrhea.  Start with two tablets (4mg  total) first and then try one tablet every 6 hours.  Avoid if you are having fevers or severe pain.  If you are not better or start feeling worse, stop all medicines and call your doctor for advice o Call your doctor if you are getting worse or not better.  Sometimes further testing (cultures, endoscopy, X-ray studies, bloodwork, etc) may be needed to help diagnose and treat the cause of the diarrhea. o

## 2013-06-02 NOTE — Progress Notes (Signed)
Subjective:     Patient ID: Emily SheerBernice C Mixson, female   DOB: 1940-09-18, 73 y.o.   MRN: [id number]  HPI  Note: This dictation was prepared with Dragon/digital dictation along with [facility] technology. Any transcriptional errors that result from this process are unintentional.       Emily Miranda  1940-09-18 [id number]  Patient Care Team: Arvin CollardJohn F. Redding II as PCP - General (Unknown Physician Specialty) Miguel AschoffAllan Ross, MD as Consulting Physician (Obstetrics and Gynecology) Crecencio McLes Borden, MD as Consulting Physician (Urology)  This patient is a 73 y.o.female who presents today for surgical evaluation at the request of Dr. Jeanie Seweredding II.   Reason for visit: Chronic right lower quadrant abdominal pain  Pleasant woman with numerous prior abdominal surgeries.  History of pelvic adhesions with lysis of adhesions by her gynecologist.  History of stricture between renal pelvis and ureter requiring robotic reconstruction.  History of incisional hernia status post laparoscopic repair with mesh. Done by Dr. Bertram SavinAmber Allen formally with our group.  Had persistent pain.  Temporary improvement with injections x2.  Persistent.  Had a stitch removed in 2011.  There was discussion about seeing I nerve specialist.  Lost to followup.  She is not seen us since 2011.  She comes in today 4 years later saying that she has had persistent pain for the past 4 years.  She has discussed with her gynecologist and urologist.  Eventually redirected to our group.  She describes it as chronic soreness in the right lower abdomen.  Not worse with eating.  No nausea or vomiting.  She claims she can walk several miles without any difficulty.  She takes Excedrin which can lessen but not obliterate the pain.  Using a heating pad helps a lot, but she is getting tired of wearing all the time.  Not with coughing or sneezing.  Often more uncomfortable lying down at night.    She does have constipation.  Occasionally takes MiraLax to have  bowel movements about every other day.  She claims she can walk 2 miles without difficulty.  No personal nor family history of GI/colon cancer, inflammatory bowel disease, irritable bowel syndrome, allergy such as Celiac Sprue, dietary/dairy problems, colitis, ulcers nor gastritis.  No recent sick contacts/gastroenteritis.  No travel outside the country.  No changes in diet.  No dysphagia to solids or liquids.  No significant heartburn or reflux.  No hematochezia, hematemesis, coffee ground emesis.  No evidence of prior gastric/peptic ulceration.    Patient Active Problem List   Diagnosis Date Noted  . Abdominal pain, RLQ (right lower quadrant) -chronic since 2009 06/02/2013  . Constipation, chronic 06/02/2013  . SUI (stress urinary incontinence, female) 06/02/2013  . Recurrent UTI (urinary tract infection) 06/02/2013  . HTN (hypertension)   . CHEST PAIN 11/27/2009  . OVERWEIGHT 11/28/2008  . ESSENTIAL HYPERTENSION, BENIGN 11/28/2008  . DM 11/25/2008  . AORTIC STENOSIS 11/25/2008  . PANCREATITIS, HX OF 11/25/2008    Past Medical History  Diagnosis Date  . Diabetes mellitus   . Pancreatitis   . Aortic stenosis, mild   . HTN (hypertension)   . Neuropathy   . Charcot foot due to diabetes mellitus   . Heart murmur   . Hyperlipidemia     Past Surgical History  Procedure Laterality Date  . Removal of ovarian cyst    . Vesicovaginal fistula closure w/ tah    . Tonsillectomy    . Exploratory laparotomy and adhesion lysis    . 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 Ventral hernia repair    . Appendectomy    . Abdominal hysterectomy    . Laparoscopic lysis of adhesions    . Foot surgery    . Kidney surgery      History   Social History  . Marital Status: Married    Spouse Name: N/A    Number of Children: N/A  . Years of Education: N/A   Occupational History  . Not on file.   Social History Main Topics  . Smoking status: Never Smoker    . Smokeless tobacco: Not on file  . Alcohol Use: No  . Drug Use: No  . Sexual Activity: Not on file   Other Topics Concern  . Not on file   Social History Narrative  . No narrative on file    History reviewed. No pertinent family history.  Current Outpatient Prescriptions  Medication Sig Dispense Refill  . amLODipine (NORVASC) 5 MG tablet daily.      . Ascorbic Acid (VITAMIN C) 100 MG tablet Take 100 mg by mouth daily.        Marland Kitchen aspirin 81 MG tablet Take 81 mg by mouth daily.        . furosemide (LASIX) 20 MG tablet daily.      Marland Kitchen GLIMEPIRIDE PO Take 2.5 mg by mouth 2 (two) times daily.        . hydrochlorothiazide (HYDRODIURIL) 12.5 MG tablet Take 12.5 mg by mouth daily.        Marland Kitchen lisinopril (PRINIVIL,ZESTRIL) 10 MG tablet Take 10 mg by mouth daily.      . nitrofurantoin, macrocrystal-monohydrate, (MACROBID) 100 MG capsule Take 100 mg by mouth at bedtime.        Marland Kitchen omeprazole (PRILOSEC) 20 MG capsule Take 20 mg by mouth daily.        . ONE TOUCH ULTRA TEST test strip       . TRADJENTA 5 MG TABS tablet       . methocarbamol (ROBAXIN) 500 MG tablet Take 1.5 tablets (750 mg total) by mouth every 8 (eight) hours as needed for muscle spasms (use for muscle cramps/pain).  30 tablet  2  . naproxen (NAPROSYN) 500 MG tablet Take 1 tablet (500 mg total) by mouth 2 (two) times daily with a meal.  40 tablet  1   No current facility-administered medications for this visit.     Allergies  Allergen Reactions  . Levofloxacin     BP 118/74  Pulse 52  Temp(Src) 98.1 F (36.7 C) (Temporal)  Resp 14  Ht 5\' 5"  (1.651 m)  Wt 163 lb (73.936 kg)  BMI 27.12 kg/m2  No results found.   Review of Systems  Constitutional: Negative for fever, chills, diaphoresis, appetite change and fatigue.  HENT: Negative for ear discharge, ear pain, sore throat and trouble swallowing.   Eyes: Negative for photophobia, discharge and visual disturbance.  Respiratory: Negative for cough, choking, chest  tightness and shortness of breath.   Cardiovascular: Positive for leg swelling. Negative for chest pain and palpitations.  Gastrointestinal: Positive for abdominal pain and constipation. Negative for nausea, vomiting, diarrhea, blood in stool, abdominal distention, anal bleeding and rectal pain.  Endocrine: Negative for cold intolerance and heat intolerance.  Genitourinary: Positive for flank pain. Negative for dysuria, frequency, vaginal discharge, difficulty urinating and vaginal pain.  Musculoskeletal: Negative for gait problem, myalgias and neck pain.  Skin: Negative for color change, pallor and rash.  Allergic/Immunologic: Negative for environmental allergies, food allergies and immunocompromised state.  Neurological: Negative for dizziness, speech difficulty, weakness and numbness.  Hematological: Negative for adenopathy.  Psychiatric/Behavioral: Negative for confusion and agitation. The patient is not nervous/anxious.        Objective:   Physical Exam  Constitutional: She is oriented to person, place, and time. She appears well-developed and well-nourished. No distress.  HENT:  Head: Normocephalic.  Mouth/Throat: Oropharynx is clear and moist. No oropharyngeal exudate.  Eyes: Conjunctivae and EOM are normal. Pupils are equal, round, and reactive to light. No scleral icterus.  Neck: Normal range of motion. Neck supple. No tracheal deviation present.  Cardiovascular: Normal rate, regular rhythm and intact distal pulses.   Pulmonary/Chest: Effort normal and breath sounds normal. No stridor. No respiratory distress. She exhibits no tenderness.  Abdominal: Soft. She exhibits no distension and no mass. There is tenderness in the right lower quadrant. There is no rigidity, no rebound, no guarding, no tenderness at McBurney's point and negative Murphy's sign. No hernia. Hernia confirmed negative in the ventral area, confirmed negative in the right inguinal area and confirmed negative in the  left inguinal area.    Genitourinary: No vaginal discharge found.  Musculoskeletal: Normal range of motion. She exhibits no tenderness.       Right elbow: She exhibits normal range of motion.       Left elbow: She exhibits normal range of motion.       Right wrist: She exhibits normal range of motion.       Left wrist: She exhibits normal range of motion.       Right hand: Normal strength noted.       Left hand: Normal strength noted.  Lymphadenopathy:       Head (right side): No posterior auricular adenopathy present.       Head (left side): No posterior auricular adenopathy present.    She has no cervical adenopathy.    She has no axillary adenopathy.       Right: No inguinal adenopathy present.       Left: No inguinal adenopathy present.  Neurological: She is alert and oriented to person, place, and time. No cranial nerve deficit. She exhibits normal muscle tone. Coordination normal.  Skin: Skin is warm and dry. No rash noted. She is not diaphoretic. No erythema.  Psychiatric: She has a normal mood and affect. Her behavior is normal. Judgment and thought content normal.       Assessment:     Vague diffuse abdominal wall soreness right lower quadrant of uncertain etiology.     Plan:     I am not certain what is going on.  Obtain records from urology and gynecology.  I strongly recommend she takes MiraLax daily to have a more regular bowel regimen.  Constipation can exacerbate things.  More aggressive anti-inflammatory regimen.  Naproxen 500 mg by mouth twice a day.  Heat 6 times a day.  Robaxin for breakthrough muscle cramping.  Do this for 3 weeks.    If no improvement, and Elavil QHS.  Consider Lyrica as backup plan.   There does not seem to be a focal trigger point to inject at this time.  If no improvement by above interventions, CT scan to rule out recurrent hernia or mass.  I would reserve for diagnostic laparoscopy as the very last option.  Would not rush to that.   She has already had numerous abdominal surgeries lysis of adhesions.

## 2013-06-09 ENCOUNTER — Telehealth (INDEPENDENT_AMBULATORY_CARE_PROVIDER_SITE_OTHER): Payer: Self-pay

## 2013-06-09 DIAGNOSIS — G8929 Other chronic pain: Secondary | ICD-10-CM

## 2013-06-09 DIAGNOSIS — R1013 Epigastric pain: Principal | ICD-10-CM

## 2013-06-09 MED ORDER — PREGABALIN 75 MG PO CAPS: 75.0000 mg | ORAL_CAPSULE | Freq: Two times a day (BID) | ORAL | Status: DC

## 2013-06-09 NOTE — Telephone Encounter (Signed)
It has been 7 days since I saw her.  Make sure she is compliant with the naproxen 500 mg twice a day.  Stop Robaxin and switch to Lyrica 75 mg by mouth twice a day.  She needs to take that for 6 weeks.  Please call to make sure that she is taking MiraLax twice a day.  He takes time for this to work.  There are no quick solutions.

## 2013-06-09 NOTE — Telephone Encounter (Signed)
Called pt back to let her know that the Lyrica rx can't be e-prescribed it has to be a written rx. I advised pt that she or her husband can come by the office to p/u the rx at the front desk. The pt advised me that she is going to check with her pharmacist first to see if there is going to be any side effects before she comes to p/u the rx. The pt doesn't want anything to make her pancreatitis worse.

## 2013-06-09 NOTE — Telephone Encounter (Signed)
Patient called to let Dr Michaell Cowing know the robaxin and naproxen are not working. Patient is still having pain and the medication is making her hyper and giving her insomnia. Patient wants to know if there is something else she can take. Lyrica was mentioned in last office visit or possible CT may be needed. Advised we will send message to Dr Michaell Cowing and call her back.

## 2013-06-09 NOTE — Addendum Note (Signed)
Addended by: Ethlyn Gallery on: 06/09/2013 01:50 PM   Modules accepted: Orders

## 2013-06-09 NOTE — Telephone Encounter (Signed)
Called pt back to check to see if she was taking the Naproxen 500mg  BID and the pt informed me that she stopped taking the Naproxen yesterday. The pt reports that the Naproxen was causing nausea,vomiting, and hyperness. The pt is taking the Miralax BD. The pt wants to know if the Lyrica will interfere with her pancreatitis if she switches over to that after stopping the Robaxin. The pt just wants to make sure that she will be able to rest b/c she has not been able to with taking the Napoxen and the Robaxin. I advised pt that I will speak to Dr Michaell Cowing and call her back.

## 2013-06-09 NOTE — Telephone Encounter (Signed)
Called pt back again after speaking with Dr Michaell Cowing. Dr Michaell Cowing really wants the pt on Tylenol or Ibuprofen around the clock with the Lyrica for 6weeks. The pt states that she can't take Tylenol b/c of her kidneys and pt can't take Ibuprofen b/c of her kidneys as well along with constipation. The pt will do the Lyrica for the 6 weeks and discuss the side effects with her pharmacist once she goes to p/u the rx. The pt has a f/u appt with Dr Michaell Cowing on 06/25/13. I advised once again that this takes time it's not a quick solution. The pt understands.

## 2013-06-16 ENCOUNTER — Other Ambulatory Visit: Payer: Self-pay | Admitting: Obstetrics and Gynecology

## 2013-06-16 DIAGNOSIS — R109 Unspecified abdominal pain: Secondary | ICD-10-CM

## 2013-06-16 DIAGNOSIS — R102 Pelvic and perineal pain: Secondary | ICD-10-CM

## 2013-06-23 ENCOUNTER — Ambulatory Visit
Admission: RE | Admit: 2013-06-23 | Discharge: 2013-06-23 | Disposition: A | Discharge status: Home / Self Care | Payer: Medicare Other | Source: Ambulatory Visit | Attending: Obstetrics and Gynecology | Admitting: Obstetrics and Gynecology

## 2013-06-23 DIAGNOSIS — R109 Unspecified abdominal pain: Secondary | ICD-10-CM

## 2013-06-23 DIAGNOSIS — R102 Pelvic and perineal pain: Secondary | ICD-10-CM

## 2013-06-23 MED ORDER — IOHEXOL 300 MG/ML  SOLN
100.0000 mL | Freq: Once | INTRAMUSCULAR | Status: AC | PRN
  Administered 2013-06-23: 100 mL via INTRAVENOUS

## 2013-06-25 ENCOUNTER — Encounter (INDEPENDENT_AMBULATORY_CARE_PROVIDER_SITE_OTHER): Payer: Medicare Other | Admitting: Surgery

## 2013-08-26 ENCOUNTER — Encounter: Payer: Self-pay | Admitting: Cardiology

## 2013-08-26 ENCOUNTER — Ambulatory Visit (INDEPENDENT_AMBULATORY_CARE_PROVIDER_SITE_OTHER): Payer: Medicare Other | Admitting: Cardiology

## 2013-08-26 VITALS — BP 130/70 | HR 53 | Ht 65.0 in | Wt 161.0 lb

## 2013-08-26 DIAGNOSIS — I359 Nonrheumatic aortic valve disorder, unspecified: Secondary | ICD-10-CM

## 2013-08-26 NOTE — Progress Notes (Signed)
HPI The patient presents for one year follow up of aortic stenosis..  Since I last saw her she has had foot surgery.  She has been somewhat limited by this.  However, she has been doing her housework without significant limitations.  She does occasionally get some chest discomfort. She says this is the same she has described previously.  She has occasional shortness of breath but she's not describing PND or orthopnea. She's not describing palpitations, presyncope or syncope. She's had no weight gain. However, she has had some mild lower extremity edema which is chronic and which is worse on the left leg where she had the surgery.    Allergies  Allergen Reactions  . Levofloxacin     Current Outpatient Prescriptions  Medication Sig Dispense Refill  . amLODipine (NORVASC) 5 MG tablet daily.      . Ascorbic Acid (VITAMIN C) 100 MG tablet Take 100 mg by mouth daily.        Marland Kitchen. aspirin 81 MG tablet Take 81 mg by mouth daily.        . furosemide (LASIX) 20 MG tablet daily.      Marland Kitchen. GLIMEPIRIDE PO Take 2.5 mg by mouth 2 (two) times daily.        . hydrochlorothiazide (HYDRODIURIL) 12.5 MG tablet Take 12.5 mg by mouth daily.        Marland Kitchen. lisinopril (PRINIVIL,ZESTRIL) 10 MG tablet Take 10 mg by mouth daily.      . methocarbamol (ROBAXIN) 500 MG tablet Take 1.5 tablets (750 mg total) by mouth every 8 (eight) hours as needed for muscle spasms (use for muscle cramps/pain).  30 tablet  2  . naproxen (NAPROSYN) 500 MG tablet Take 1 tablet (500 mg total) by mouth 2 (two) times daily with a meal.  40 tablet  1  . nitrofurantoin, macrocrystal-monohydrate, (MACROBID) 100 MG capsule Take 100 mg by mouth at bedtime.        Marland Kitchen. omeprazole (PRILOSEC) 20 MG capsule Take 20 mg by mouth daily.        . ONE TOUCH ULTRA TEST test strip       . pregabalin (LYRICA) 75 MG capsule Take 1 capsule (75 mg total) by mouth 2 (two) times daily.  60 capsule  1  . TRADJENTA 5 MG TABS tablet       . traMADol (ULTRAM) 50 MG tablet Take 50  mg by mouth 2 (two) times daily.        No current facility-administered medications for this visit.    Past Medical History  Diagnosis Date  . Diabetes mellitus   . Pancreatitis   . Aortic stenosis, mild   . HTN (hypertension)   . Neuropathy   . Charcot foot due to diabetes mellitus   . Heart murmur   . Hyperlipidemia     Past Surgical History  Procedure Laterality Date  . Ovarian cyst removal    . Tonsillectomy    . Laparoscopic incisional / umbilical / ventral hernia repair  01/04/2008    Dr Bertram SavinAmber Allen  . Appendectomy    . Abdominal hysterectomy    . Laparoscopic lysis of adhesions  2007    Dr Donovan KailAllen Ross  . Foot surgery    . Suture removal  08/17/2009    Dr Bertram SavinAmber Allen .  Right paramedian GoreTex stitch  . Robot assisted pyeloplasty  02/2007    Dr Laverle PatterBorden    ROS:  Foot pain.  Otherwise as stated in the HPI and negative  for all other systems.  PHYSICAL EXAM BP 130/70  Pulse 53  Ht 5\' 5"  (1.651 m)  Wt 161 lb (73.029 kg)  BMI 26.79 kg/m2 GENERAL:  Well appearing HEENT:  Pupils equal round and reactive, fundi not visualized, oral mucosa unremarkable NECK:  No jugular venous distention, waveform within normal limits, carotid upstroke brisk and symmetric, no bruits, no thyromegaly LYMPHATICS:  No cervical, inguinal adenopathy LUNGS:  Clear to auscultation bilaterally BACK:  No CVA tenderness CHEST:  Unremarkable HEART:  PMI not displaced or sustained,S1 and S2 within normal limits, no S3, no S4, no clicks, no rubs.  Apical early peaking systolic murmur heard out the aortic outflow tract. ABD:  Flat, positive bowel sounds normal in frequency in pitch, no bruits, no rebound, no guarding, no midline pulsatile mass, no hepatomegaly, no splenomegaly EXT:  2 plus pulses throughout, no edema, no cyanosis no clubbing SKIN:  No rashes no nodules NEURO:  Cranial nerves II through XII grossly intact, motor grossly intact throughout PSYCH:  Cognitively intact, oriented to person  place and time   EKG:  Sinus bradycardia, rate 53, no acute ST T wave changes.  08/26/2013  ASSESSMENT AND PLAN  HTN (hypertension) -  The blood pressure is not at target. However, this is actively being managed by [facility] Valrie Hart., MD.   CHEST PAIN -  This doesn't seem to have changed since her previous stress test. Therefore, no further cardiovascular testing is suggested.   AORTIC STENOSIS -  I suspect that this is unchanged.  I will order an echocardiogram in December as it will be two years from the previous.  We discussed the symptoms that will occur if this becomes severe.

## 2013-08-26 NOTE — Patient Instructions (Signed)
The current medical regimen is effective;  continue present plan and medications.  Your physician has requested that you have an echocardiogram in December. Echocardiography is a painless test that uses sound waves to create images of your heart. It provides your doctor with information about the size and shape of your heart and how well your heart's chambers and valves are working. This procedure takes approximately one hour. There are no restrictions for this procedure.  Follow up in 1 year with Dr Antoine Poche.  You will receive a letter in the mail 2 months before you are due.  Please call us when you receive this letter to schedule your follow up appointment.

## 2014-04-20 ENCOUNTER — Other Ambulatory Visit (HOSPITAL_COMMUNITY): Payer: Medicare Other

## 2014-04-28 ENCOUNTER — Ambulatory Visit: Payer: Self-pay

## 2014-05-26 ENCOUNTER — Ambulatory Visit (HOSPITAL_COMMUNITY): Payer: Medicare Other | Attending: Cardiology | Admitting: Radiology

## 2014-05-26 DIAGNOSIS — I359 Nonrheumatic aortic valve disorder, unspecified: Secondary | ICD-10-CM | POA: Diagnosis not present

## 2014-05-26 DIAGNOSIS — E119 Type 2 diabetes mellitus without complications: Secondary | ICD-10-CM | POA: Diagnosis not present

## 2014-05-26 DIAGNOSIS — E785 Hyperlipidemia, unspecified: Secondary | ICD-10-CM | POA: Insufficient documentation

## 2014-05-26 NOTE — Progress Notes (Signed)
Echocardiogram performed.  

## 2014-06-10 DIAGNOSIS — J329 Chronic sinusitis, unspecified: Secondary | ICD-10-CM | POA: Diagnosis not present

## 2014-06-17 DIAGNOSIS — J329 Chronic sinusitis, unspecified: Secondary | ICD-10-CM | POA: Diagnosis not present

## 2014-07-01 DIAGNOSIS — J029 Acute pharyngitis, unspecified: Secondary | ICD-10-CM | POA: Diagnosis not present

## 2014-08-08 ENCOUNTER — Emergency Department (HOSPITAL_COMMUNITY)
Admission: EM | Admit: 2014-08-08 | Discharge: 2014-08-08 | Disposition: A | Discharge status: Home / Self Care | Payer: Medicare Other | Attending: Emergency Medicine | Admitting: Emergency Medicine

## 2014-08-08 ENCOUNTER — Emergency Department (HOSPITAL_COMMUNITY): Payer: Medicare Other

## 2014-08-08 ENCOUNTER — Encounter (HOSPITAL_COMMUNITY): Payer: Self-pay | Admitting: Emergency Medicine

## 2014-08-08 DIAGNOSIS — G629 Polyneuropathy, unspecified: Secondary | ICD-10-CM | POA: Diagnosis not present

## 2014-08-08 DIAGNOSIS — R197 Diarrhea, unspecified: Secondary | ICD-10-CM

## 2014-08-08 DIAGNOSIS — R011 Cardiac murmur, unspecified: Secondary | ICD-10-CM | POA: Insufficient documentation

## 2014-08-08 DIAGNOSIS — R1030 Lower abdominal pain, unspecified: Secondary | ICD-10-CM | POA: Diagnosis not present

## 2014-08-08 DIAGNOSIS — E11610 Type 2 diabetes mellitus with diabetic neuropathic arthropathy: Secondary | ICD-10-CM | POA: Insufficient documentation

## 2014-08-08 DIAGNOSIS — K921 Melena: Secondary | ICD-10-CM | POA: Diagnosis not present

## 2014-08-08 DIAGNOSIS — R102 Pelvic and perineal pain: Secondary | ICD-10-CM | POA: Diagnosis not present

## 2014-08-08 DIAGNOSIS — I10 Essential (primary) hypertension: Secondary | ICD-10-CM | POA: Diagnosis not present

## 2014-08-08 DIAGNOSIS — Z79899 Other long term (current) drug therapy: Secondary | ICD-10-CM | POA: Diagnosis not present

## 2014-08-08 LAB — CBC
HCT: 33.5 % — ABNORMAL LOW (ref 36.0–46.0)
Hemoglobin: 10.7 g/dL — ABNORMAL LOW (ref 12.0–15.0)
MCH: 29.2 pg (ref 26.0–34.0)
MCHC: 31.9 g/dL (ref 30.0–36.0)
MCV: 91.5 fL (ref 78.0–100.0)
Platelets: 186 10*3/uL (ref 150–400)
RBC: 3.66 MIL/uL — ABNORMAL LOW (ref 3.87–5.11)
RDW: 14.2 % (ref 11.5–15.5)
WBC: 7.3 10*3/uL (ref 4.0–10.5)

## 2014-08-08 LAB — LIPASE, BLOOD: Lipase: 18 U/L (ref 11–59)

## 2014-08-08 LAB — COMPREHENSIVE METABOLIC PANEL
ALT: 10 U/L (ref 0–35)
AST: 18 U/L (ref 0–37)
Albumin: 3.7 g/dL (ref 3.5–5.2)
Alkaline Phosphatase: 67 U/L (ref 39–117)
Anion gap: 6 (ref 5–15)
BUN: 21 mg/dL (ref 6–23)
CO2: 24 mmol/L (ref 19–32)
Calcium: 9 mg/dL (ref 8.4–10.5)
Chloride: 108 mmol/L (ref 96–112)
Creatinine, Ser: 1.23 mg/dL — ABNORMAL HIGH (ref 0.50–1.10)
GFR calc Af Amer: 50 mL/min — ABNORMAL LOW (ref >90)
GFR calc non Af Amer: 43 mL/min — ABNORMAL LOW (ref >90)
Glucose, Bld: 236 mg/dL — ABNORMAL HIGH (ref 70–99)
Potassium: 4.5 mmol/L (ref 3.5–5.1)
Sodium: 138 mmol/L (ref 135–145)
Total Bilirubin: 0.4 mg/dL (ref 0.3–1.2)
Total Protein: 6.6 g/dL (ref 6.0–8.3)

## 2014-08-08 LAB — POC OCCULT BLOOD, ED: Fecal Occult Bld: POSITIVE — AB

## 2014-08-08 LAB — TYPE AND SCREEN
ABO/RH(D): O POS
Antibody Screen: NEGATIVE

## 2014-08-08 MED ORDER — MORPHINE SULFATE 4 MG/ML IJ SOLN
4.0000 mg | Freq: Once | INTRAMUSCULAR | Status: AC
  Administered 2014-08-08: 4 mg via INTRAVENOUS
  Filled 2014-08-08: qty 1

## 2014-08-08 MED ORDER — IOHEXOL 300 MG/ML  SOLN: 50.0000 mL | Freq: Once | INTRAMUSCULAR | Status: DC | PRN

## 2014-08-08 MED ORDER — HYDROCODONE-ACETAMINOPHEN 5-325 MG PO TABS: 1.0000 | ORAL_TABLET | Freq: Four times a day (QID) | ORAL | Status: DC | PRN

## 2014-08-08 MED ORDER — SODIUM CHLORIDE 0.9 % IV BOLUS (SEPSIS)
1000.0000 mL | Freq: Once | INTRAVENOUS | Status: AC
Start: 2014-08-08 — End: 2014-08-08
  Administered 2014-08-08: 1000 mL via INTRAVENOUS

## 2014-08-08 NOTE — ED Notes (Signed)
Patient transported to CT 

## 2014-08-08 NOTE — ED Notes (Signed)
Pt states for the past 2 months she has attacks of abd pain and has diarrhea that ends up with blood in stool. Pt states all day yesterday she had bright red blood in each stool but it did not ease up. States normally it starts as clots then it pours out.

## 2014-08-08 NOTE — Discharge Instructions (Signed)
Bloody Stools  Bloody stools often mean that there is a problem in the digestive tract. Your caregiver may use the term "melena" to describe black, tarry, and bad smelling stools or "hematochezia" to describe red or maroon-colored stools. Blood seen in the stool can be caused by bleeding anywhere along the intestinal tract.   A black stool usually means that blood is coming from the upper part of the gastrointestinal tract (esophagus, stomach, or small bowel). Passing maroon-colored stools or bright red blood usually means that blood is coming from lower down in the large bowel or the rectum. However, sometimes massive bleeding in the stomach or small intestine can cause bright red bloody stools.   Consuming black licorice, lead, iron pills, medicines containing bismuth subsalicylate, or blueberries can also cause black stools. Your caregiver can test black stools to see if blood is present.  It is important that the cause of the bleeding be found. Treatment can then be started, and the problem can be corrected. Rectal bleeding may not be serious, but you should not assume everything is okay until you know the cause. It is very important to follow up with your caregiver or a specialist in gastrointestinal problems.  CAUSES   Blood in the stools can come from various underlying causes. Often, the cause is not found during your first visit. Testing is often needed to discover the cause of bleeding in the gastrointestinal tract. Causes range from simple to serious or even life-threatening. Possible causes include:  · Hemorrhoids. These are veins that are full of blood (engorged) in the rectum. They cause pain, inflammation, and may bleed.  · Anal fissures. These are areas of painful tearing which may bleed. They are often caused by passing hard stool.  · Diverticulosis. These are pouches that form on the colon over time, with age, and may bleed significantly.  · Diverticulitis. This is inflammation in areas with  diverticulosis. It can cause pain, fever, and bloody stools, although bleeding is rare.  · Proctitis and colitis. These are inflamed areas of the rectum or colon. They may cause pain, fever, and bloody stools.  · Polyps and cancer. Colon cancer is a leading cause of preventable cancer death. It often starts out as precancerous polyps that can be removed during a colonoscopy, preventing progression into cancer. Sometimes, polyps and cancer may cause rectal bleeding.  · Gastritis and ulcers. Bleeding from the upper gastrointestinal tract (near the stomach) may travel through the intestines and produce black, sometimes tarry, often bad smelling stools. In certain cases, if the bleeding is fast enough, the stools may not be black, but red and the condition may be life-threatening.  SYMPTOMS   You may have stools that are bright red and bloody, that are normal color with blood on them, or that are dark black and tarry. In some cases, you may only have blood in the toilet bowl. Any of these cases need medical care. You may also have:  · Pain at the anus or anywhere in the rectum.  · Lightheadedness or feeling faint.  · Extreme weakness.  · Nausea or vomiting.  · Fever.  DIAGNOSIS  Your caregiver may use the following methods to find the cause of your bleeding:  · Taking a medical history. Age is important. Older people tend to develop polyps and cancer more often. If there is anal pain and a hard, large stool associated with bleeding, a tear of the anus may be the cause. If blood drips into the toilet after a bowel movement, bleeding hemorrhoids may be the   problem. The color and frequency of the bleeding are additional considerations. In most cases, the medical history provides clues, but seldom the final answer.  · A visual and finger (digital) exam. Your caregiver will inspect the anal area, looking for tears and hemorrhoids. A finger exam can provide information when there is tenderness or a growth inside. In men, the  prostate is also examined.  · Endoscopy. Several types of small, long scopes (endoscopes) are used to view the colon.  ¨ In the office, your caregiver may use a rigid, or more commonly, a flexible viewing sigmoidoscope. This exam is called flexible sigmoidoscopy. It is performed in 5 to 10 minutes.  ¨ A more thorough exam is accomplished with a colonoscope. It allows your caregiver to view the entire 5 to 6 foot long colon. Medicine to help you relax (sedative) is usually given for this exam. Frequently, a bleeding lesion may be present beyond the reach of the sigmoidoscope. So, a colonoscopy may be the best exam to start with. Both exams are usually done on an outpatient basis. This means the patient does not stay overnight in the hospital or surgery center.  ¨ An upper endoscopy may be needed to examine your stomach. Sedation is used and a flexible endoscope is put in your mouth, down to your stomach.  · A barium enema X-ray. This is an X-ray exam. It uses liquid barium inserted by enema into the rectum. This test alone may not identify an actual bleeding point. X-rays highlight abnormal shadows, such as those made by lumps (tumors), diverticuli, or colitis.  TREATMENT   Treatment depends on the cause of your bleeding.   · For bleeding from the stomach or colon, the caregiver doing your endoscopy or colonoscopy may be able to stop the bleeding as part of the procedure.  · Inflammation or infection of the colon can be treated with medicines.  · Many rectal problems can be treated with creams, suppositories, or warm baths.  · Surgery is sometimes needed.  · Blood transfusions are sometimes needed if you have lost a lot of blood.  · For any bleeding problem, let your caregiver know if you take aspirin or other blood thinners regularly.  HOME CARE INSTRUCTIONS   · Take any medicines exactly as prescribed.  · Keep your stools soft by eating a diet high in fiber. Prunes (1 to 3 a day) work well for many people.  · Drink  enough water and fluids to keep your urine clear or pale yellow.  · Take sitz baths if advised. A sitz bath is when you sit in a bathtub with warm water for 10 to 15 minutes to soak, soothe, and cleanse the rectal area.  · If enemas or suppositories are advised, be sure you know how to use them. Tell your caregiver if you have problems with this.  · Monitor your bowel movements to look for signs of improvement or worsening.  SEEK MEDICAL CARE IF:   · You do not improve in the time expected.  · Your condition worsens after initial improvement.  · You develop any new symptoms.  SEEK IMMEDIATE MEDICAL CARE IF:   · You develop severe or prolonged rectal bleeding.  · You vomit blood.  · You feel weak or faint.  · You have a fever.  MAKE SURE YOU:  · Understand these instructions.  · Will watch your condition.  · Will get help right away if you are not doing well or get worse.    Document Released: 04/19/2002 Document Revised: 07/22/2011 Document Reviewed: 09/14/2010  [employer]® Patient Information ©2015 [employer]. This information is not intended to replace advice given to you by your health care provider. Make sure you discuss any questions you have with your health care provider.

## 2014-08-12 NOTE — ED Provider Notes (Signed)
CSN: [id number]     Arrival date & time 08/08/14  1425 History   First MD Initiated Contact with Patient 08/08/14 1739     Chief Complaint  Patient presents with  . Diarrhea  . Blood In Stools     (Consider location/radiation/quality/duration/timing/severity/associated sxs/prior Treatment) HPI Comments: Pt states for the past 2 months she has attacks of abd pain and has diarrhea that ends up with blood in stool. Pt states all day yesterday she had bright red blood in each stool but it did not ease up. States normally it starts as clots then it pours out.  Last episode of bleeding was about 8 AM this morning.  Patient states that she has a gastroenterologist and has never discussed these symptoms with him or her PCP.   Patient is a 74 y.o. female presenting with diarrhea.  Diarrhea Associated symptoms: abdominal pain   Associated symptoms: no arthralgias, no chills, no diaphoresis, no fever, no headaches and no vomiting     Past Medical History  Diagnosis Date  . Diabetes mellitus   . Pancreatitis   . Aortic stenosis, mild   . HTN (hypertension)   . Neuropathy   . Charcot foot due to diabetes mellitus   . Heart murmur   . Hyperlipidemia    Past Surgical History  Procedure Laterality Date  . Ovarian cyst removal    . Tonsillectomy    . Laparoscopic incisional / umbilical / ventral hernia repair  01/04/2008    Dr Bertram Savin  . Appendectomy    . Abdominal hysterectomy    . Laparoscopic lysis of adhesions  2007    Dr Donovan Kail  . Foot surgery    . Suture removal  08/17/2009    Dr Bertram Savin .  Right paramedian GoreTex stitch  . Robot assisted pyeloplasty  02/2007    Dr Laverle Patter   No family history on file. History  Substance Use Topics  . Smoking status: Never Smoker   . Smokeless tobacco: Not on file  . Alcohol Use: No   OB History    No data available     Review of Systems  Constitutional: Negative for fever, chills, diaphoresis, activity change, appetite change  and fatigue.  HENT: Negative for congestion, facial swelling, rhinorrhea and sore throat.   Eyes: Negative for photophobia and discharge.  Respiratory: Negative for cough, chest tightness and shortness of breath.   Cardiovascular: Negative for chest pain, palpitations and leg swelling.  Gastrointestinal: Positive for abdominal pain, diarrhea and blood in stool. Negative for nausea and vomiting.  Endocrine: Negative for polydipsia and polyuria.  Genitourinary: Negative for dysuria, frequency, difficulty urinating and pelvic pain.  Musculoskeletal: Negative for back pain, arthralgias, neck pain and neck stiffness.  Skin: Negative for color change and wound.  Allergic/Immunologic: Negative for immunocompromised state.  Neurological: Negative for facial asymmetry, weakness, numbness and headaches.  Hematological: Does not bruise/bleed easily.  Psychiatric/Behavioral: Negative for confusion and agitation.      Allergies  Levofloxacin  Home Medications   Prior to Admission medications   Medication Sig Start Date End Date Taking? Authorizing Provider  amLODipine (NORVASC) 5 MG tablet Take 5 mg by mouth daily.  05/20/13  Yes Historical Provider, MD  Ascorbic Acid (VITAMIN C) 100 MG tablet Take 100 mg by mouth daily.     Yes Historical Provider, MD  furosemide (LASIX) 20 MG tablet Take 20 mg by mouth daily.  05/14/13  Yes Historical Provider, MD  glimepiride (AMARYL) 4 MG tablet  Take 4 mg by mouth daily with breakfast.   Yes Historical Provider, MD  hydrochlorothiazide (HYDRODIURIL) 12.5 MG tablet Take 12.5 mg by mouth daily.     Yes Historical Provider, MD  lisinopril (PRINIVIL,ZESTRIL) 10 MG tablet Take 10 mg by mouth daily.   Yes Historical Provider, MD  methocarbamol (ROBAXIN) 500 MG tablet Take 1.5 tablets (750 mg total) by mouth every 8 (eight) hours as needed for muscle spasms (use for muscle cramps/pain). 06/02/13  Yes Karie Soda, MD  naproxen (NAPROSYN) 500 MG tablet Take 1 tablet (500  mg total) by mouth 2 (two) times daily with a meal. 06/02/13  Yes Karie Soda, MD  nitrofurantoin, macrocrystal-monohydrate, (MACROBID) 100 MG capsule Take 100 mg by mouth 2 (two) times daily.    Yes Historical Provider, MD  Phenyleph-Doxylamine-DM-APAP (ALKA SELTZER PLUS PO) Take 1 tablet by mouth daily as needed (congestion).   Yes Historical Provider, MD  TRADJENTA 5 MG TABS tablet Take 5 mg by mouth daily.  05/20/13  Yes Historical Provider, MD  traMADol (ULTRAM) 50 MG tablet Take 50 mg by mouth 2 (two) times daily.  08/17/13  Yes Historical Provider, MD  HYDROcodone-acetaminophen (NORCO) 5-325 MG per tablet Take 1 tablet by mouth every 6 (six) hours as needed. 08/08/14   Toy Cookey, MD  ONE TOUCH ULTRA TEST test strip 1 each by Other route once a week.  05/26/13   Historical Provider, MD  pregabalin (LYRICA) 75 MG capsule Take 1 capsule (75 mg total) by mouth 2 (two) times daily. Patient not taking: Reported on 08/08/2014 06/09/13   Karie Soda, MD   BP 153/63 mmHg  Pulse 52  Temp(Src) 98 F (36.7 C) (Oral)  Resp 16  SpO2 100% Physical Exam  Constitutional: She is oriented to person, place, and time. She appears well-developed and well-nourished. No distress.  HENT:  Head: Normocephalic and atraumatic.  Mouth/Throat: No oropharyngeal exudate.  Eyes: Pupils are equal, round, and reactive to light.  Neck: Normal range of motion. Neck supple.  Cardiovascular: Normal rate, regular rhythm and normal heart sounds.  Exam reveals no gallop and no friction rub.   No murmur heard. Pulmonary/Chest: Effort normal and breath sounds normal. No respiratory distress. She has no wheezes. She has no rales.  Abdominal: Soft. Bowel sounds are normal. She exhibits no distension and no mass. There is generalized tenderness. There is no rebound and no guarding.  Genitourinary: Guaiac positive stool (not grossly bloody).  Several external, nonbleeding hemorrhoids  Musculoskeletal: Normal range of motion. She  exhibits no edema or tenderness.  Neurological: She is alert and oriented to person, place, and time.  Skin: Skin is warm and dry.  Psychiatric: She has a normal mood and affect.    ED Course  Procedures (including critical care time) Labs Review Labs Reviewed  CBC - Abnormal; Notable for the following:    RBC 3.66 (*)    Hemoglobin 10.7 (*)    HCT 33.5 (*)    All other components within normal limits  COMPREHENSIVE METABOLIC PANEL - Abnormal; Notable for the following:    Glucose, Bld 236 (*)    Creatinine, Ser 1.23 (*)    GFR calc non Af Amer 43 (*)    GFR calc Af Amer 50 (*)    All other components within normal limits  POC OCCULT BLOOD, ED - Abnormal; Notable for the following:    Fecal Occult Bld POSITIVE (*)    All other components within normal limits  CLOSTRIDIUM DIFFICILE BY PCR  LIPASE, BLOOD  TYPE AND SCREEN    Imaging Review No results found.   EKG Interpretation None      MDM   Final diagnoses:  Lower abdominal pain  Bloody diarrhea    Pt is a 74 y.o. female with Pmhx as above who presents with 2 months of intermittent epsiodes of abdominal pain and bloody stools. Most recently this morning. Denies fevers, chills, or localized pain, She has been on abx recently. On PE, VSS, pt in NAD. +gen ab pain w/o rebound or guarding. Stool heme card is positive but not grossly bloody.  She's had no episodes of diarrhea in the emergency Department.  Creatinine is mildly elevated BUNs normal.  Hemoglobin is stable.  CT abdomen and pelvis ordered as negative.  I believe patient is safe to follow-up with her PCP/GI for further workup including testing for Clostridium difficile, which I think is unlikely and does not warrant treatment Culture, given that the stools have been intermittent for 2 months.     [name] evaluation in the Emergency Department is complete. It has been determined that no acute conditions requiring further emergency intervention are  present at this time. The patient/guardian have been advised of the diagnosis and plan. We have discussed signs and symptoms that warrant return to the ED, such as changes or worsening in symptoms, worsening pain, increasing bleeding, fever      Toy Cookey, MD 08/12/14 [phone]

## 2014-08-26 DIAGNOSIS — R351 Nocturia: Secondary | ICD-10-CM | POA: Diagnosis not present

## 2014-08-26 DIAGNOSIS — R350 Frequency of micturition: Secondary | ICD-10-CM | POA: Diagnosis not present

## 2014-08-29 DIAGNOSIS — J302 Other seasonal allergic rhinitis: Secondary | ICD-10-CM | POA: Diagnosis not present

## 2014-08-29 DIAGNOSIS — J310 Chronic rhinitis: Secondary | ICD-10-CM | POA: Diagnosis not present

## 2014-08-29 DIAGNOSIS — T485X1A Poisoning by other anti-common-cold drugs, accidental (unintentional), initial encounter: Secondary | ICD-10-CM | POA: Diagnosis not present

## 2014-09-26 DIAGNOSIS — K5902 Outlet dysfunction constipation: Secondary | ICD-10-CM | POA: Diagnosis not present

## 2014-10-04 DIAGNOSIS — H5203 Hypermetropia, bilateral: Secondary | ICD-10-CM | POA: Diagnosis not present

## 2014-10-04 DIAGNOSIS — H3531 Nonexudative age-related macular degeneration: Secondary | ICD-10-CM | POA: Diagnosis not present

## 2014-11-28 DIAGNOSIS — H4011X2 Primary open-angle glaucoma, moderate stage: Secondary | ICD-10-CM | POA: Diagnosis not present

## 2014-12-13 DIAGNOSIS — K5902 Outlet dysfunction constipation: Secondary | ICD-10-CM | POA: Diagnosis not present

## 2014-12-13 DIAGNOSIS — N8184 Pelvic muscle wasting: Secondary | ICD-10-CM | POA: Diagnosis not present

## 2014-12-13 DIAGNOSIS — E119 Type 2 diabetes mellitus without complications: Secondary | ICD-10-CM | POA: Diagnosis not present

## 2014-12-13 DIAGNOSIS — N3946 Mixed incontinence: Secondary | ICD-10-CM | POA: Diagnosis not present

## 2014-12-13 DIAGNOSIS — Z9889 Other specified postprocedural states: Secondary | ICD-10-CM | POA: Diagnosis not present

## 2014-12-13 DIAGNOSIS — M62838 Other muscle spasm: Secondary | ICD-10-CM | POA: Diagnosis not present

## 2014-12-13 DIAGNOSIS — I10 Essential (primary) hypertension: Secondary | ICD-10-CM | POA: Diagnosis not present

## 2014-12-13 DIAGNOSIS — Z79899 Other long term (current) drug therapy: Secondary | ICD-10-CM | POA: Diagnosis not present

## 2014-12-13 DIAGNOSIS — Z7982 Long term (current) use of aspirin: Secondary | ICD-10-CM | POA: Diagnosis not present

## 2014-12-13 DIAGNOSIS — Z90710 Acquired absence of both cervix and uterus: Secondary | ICD-10-CM | POA: Diagnosis not present

## 2014-12-13 DIAGNOSIS — R159 Full incontinence of feces: Secondary | ICD-10-CM | POA: Diagnosis not present

## 2014-12-20 DIAGNOSIS — R159 Full incontinence of feces: Secondary | ICD-10-CM | POA: Diagnosis not present

## 2014-12-20 DIAGNOSIS — E119 Type 2 diabetes mellitus without complications: Secondary | ICD-10-CM | POA: Diagnosis not present

## 2014-12-20 DIAGNOSIS — N3946 Mixed incontinence: Secondary | ICD-10-CM | POA: Diagnosis not present

## 2014-12-20 DIAGNOSIS — M62838 Other muscle spasm: Secondary | ICD-10-CM | POA: Diagnosis not present

## 2014-12-20 DIAGNOSIS — Z9889 Other specified postprocedural states: Secondary | ICD-10-CM | POA: Diagnosis not present

## 2014-12-20 DIAGNOSIS — N8184 Pelvic muscle wasting: Secondary | ICD-10-CM | POA: Diagnosis not present

## 2014-12-20 DIAGNOSIS — K5902 Outlet dysfunction constipation: Secondary | ICD-10-CM | POA: Diagnosis not present

## 2014-12-20 DIAGNOSIS — I10 Essential (primary) hypertension: Secondary | ICD-10-CM | POA: Diagnosis not present

## 2014-12-20 DIAGNOSIS — Z7982 Long term (current) use of aspirin: Secondary | ICD-10-CM | POA: Diagnosis not present

## 2014-12-20 DIAGNOSIS — Z90710 Acquired absence of both cervix and uterus: Secondary | ICD-10-CM | POA: Diagnosis not present

## 2014-12-20 DIAGNOSIS — Z79899 Other long term (current) drug therapy: Secondary | ICD-10-CM | POA: Diagnosis not present

## 2014-12-23 DIAGNOSIS — H18411 Arcus senilis, right eye: Secondary | ICD-10-CM | POA: Diagnosis not present

## 2014-12-23 DIAGNOSIS — E119 Type 2 diabetes mellitus without complications: Secondary | ICD-10-CM | POA: Diagnosis not present

## 2014-12-23 DIAGNOSIS — Z961 Presence of intraocular lens: Secondary | ICD-10-CM | POA: Diagnosis not present

## 2014-12-23 DIAGNOSIS — H409 Unspecified glaucoma: Secondary | ICD-10-CM | POA: Diagnosis not present

## 2015-01-03 DIAGNOSIS — M62838 Other muscle spasm: Secondary | ICD-10-CM | POA: Diagnosis not present

## 2015-01-03 DIAGNOSIS — N8184 Pelvic muscle wasting: Secondary | ICD-10-CM | POA: Diagnosis not present

## 2015-01-03 DIAGNOSIS — E119 Type 2 diabetes mellitus without complications: Secondary | ICD-10-CM | POA: Diagnosis not present

## 2015-01-03 DIAGNOSIS — Z79899 Other long term (current) drug therapy: Secondary | ICD-10-CM | POA: Diagnosis not present

## 2015-01-03 DIAGNOSIS — Z9889 Other specified postprocedural states: Secondary | ICD-10-CM | POA: Diagnosis not present

## 2015-01-03 DIAGNOSIS — Z7982 Long term (current) use of aspirin: Secondary | ICD-10-CM | POA: Diagnosis not present

## 2015-01-03 DIAGNOSIS — I10 Essential (primary) hypertension: Secondary | ICD-10-CM | POA: Diagnosis not present

## 2015-01-03 DIAGNOSIS — K5902 Outlet dysfunction constipation: Secondary | ICD-10-CM | POA: Diagnosis not present

## 2015-01-03 DIAGNOSIS — N3946 Mixed incontinence: Secondary | ICD-10-CM | POA: Diagnosis not present

## 2015-01-03 DIAGNOSIS — Z90710 Acquired absence of both cervix and uterus: Secondary | ICD-10-CM | POA: Diagnosis not present

## 2015-01-03 DIAGNOSIS — R159 Full incontinence of feces: Secondary | ICD-10-CM | POA: Diagnosis not present

## 2015-01-10 DIAGNOSIS — Z9889 Other specified postprocedural states: Secondary | ICD-10-CM | POA: Diagnosis not present

## 2015-01-10 DIAGNOSIS — Z79899 Other long term (current) drug therapy: Secondary | ICD-10-CM | POA: Diagnosis not present

## 2015-01-10 DIAGNOSIS — R159 Full incontinence of feces: Secondary | ICD-10-CM | POA: Diagnosis not present

## 2015-01-10 DIAGNOSIS — E119 Type 2 diabetes mellitus without complications: Secondary | ICD-10-CM | POA: Diagnosis not present

## 2015-01-10 DIAGNOSIS — N3946 Mixed incontinence: Secondary | ICD-10-CM | POA: Diagnosis not present

## 2015-01-10 DIAGNOSIS — M62838 Other muscle spasm: Secondary | ICD-10-CM | POA: Diagnosis not present

## 2015-01-10 DIAGNOSIS — Z7982 Long term (current) use of aspirin: Secondary | ICD-10-CM | POA: Diagnosis not present

## 2015-01-10 DIAGNOSIS — N8184 Pelvic muscle wasting: Secondary | ICD-10-CM | POA: Diagnosis not present

## 2015-01-10 DIAGNOSIS — K5902 Outlet dysfunction constipation: Secondary | ICD-10-CM | POA: Diagnosis not present

## 2015-01-10 DIAGNOSIS — I10 Essential (primary) hypertension: Secondary | ICD-10-CM | POA: Diagnosis not present

## 2015-01-10 DIAGNOSIS — Z90710 Acquired absence of both cervix and uterus: Secondary | ICD-10-CM | POA: Diagnosis not present

## 2015-01-18 DIAGNOSIS — I10 Essential (primary) hypertension: Secondary | ICD-10-CM | POA: Diagnosis not present

## 2015-01-18 DIAGNOSIS — K5902 Outlet dysfunction constipation: Secondary | ICD-10-CM | POA: Diagnosis not present

## 2015-01-18 DIAGNOSIS — M62838 Other muscle spasm: Secondary | ICD-10-CM | POA: Diagnosis not present

## 2015-01-18 DIAGNOSIS — E119 Type 2 diabetes mellitus without complications: Secondary | ICD-10-CM | POA: Diagnosis not present

## 2015-01-18 DIAGNOSIS — Z90710 Acquired absence of both cervix and uterus: Secondary | ICD-10-CM | POA: Diagnosis not present

## 2015-01-18 DIAGNOSIS — N3946 Mixed incontinence: Secondary | ICD-10-CM | POA: Diagnosis not present

## 2015-01-18 DIAGNOSIS — N8184 Pelvic muscle wasting: Secondary | ICD-10-CM | POA: Diagnosis not present

## 2015-01-18 DIAGNOSIS — R159 Full incontinence of feces: Secondary | ICD-10-CM | POA: Diagnosis not present

## 2015-01-18 DIAGNOSIS — Z9889 Other specified postprocedural states: Secondary | ICD-10-CM | POA: Diagnosis not present

## 2015-01-18 DIAGNOSIS — Z79899 Other long term (current) drug therapy: Secondary | ICD-10-CM | POA: Diagnosis not present

## 2015-01-18 DIAGNOSIS — Z7982 Long term (current) use of aspirin: Secondary | ICD-10-CM | POA: Diagnosis not present

## 2015-01-23 DIAGNOSIS — H4011X1 Primary open-angle glaucoma, mild stage: Secondary | ICD-10-CM | POA: Diagnosis not present

## 2015-01-23 DIAGNOSIS — H2511 Age-related nuclear cataract, right eye: Secondary | ICD-10-CM | POA: Diagnosis not present

## 2015-01-25 DIAGNOSIS — I10 Essential (primary) hypertension: Secondary | ICD-10-CM | POA: Diagnosis not present

## 2015-01-25 DIAGNOSIS — N8184 Pelvic muscle wasting: Secondary | ICD-10-CM | POA: Diagnosis not present

## 2015-01-25 DIAGNOSIS — Z9889 Other specified postprocedural states: Secondary | ICD-10-CM | POA: Diagnosis not present

## 2015-01-25 DIAGNOSIS — N3946 Mixed incontinence: Secondary | ICD-10-CM | POA: Diagnosis not present

## 2015-01-25 DIAGNOSIS — Z7982 Long term (current) use of aspirin: Secondary | ICD-10-CM | POA: Diagnosis not present

## 2015-01-25 DIAGNOSIS — E119 Type 2 diabetes mellitus without complications: Secondary | ICD-10-CM | POA: Diagnosis not present

## 2015-01-25 DIAGNOSIS — K5902 Outlet dysfunction constipation: Secondary | ICD-10-CM | POA: Diagnosis not present

## 2015-01-25 DIAGNOSIS — Z79899 Other long term (current) drug therapy: Secondary | ICD-10-CM | POA: Diagnosis not present

## 2015-01-25 DIAGNOSIS — Z90710 Acquired absence of both cervix and uterus: Secondary | ICD-10-CM | POA: Diagnosis not present

## 2015-01-25 DIAGNOSIS — M62838 Other muscle spasm: Secondary | ICD-10-CM | POA: Diagnosis not present

## 2015-01-25 DIAGNOSIS — R159 Full incontinence of feces: Secondary | ICD-10-CM | POA: Diagnosis not present

## 2015-02-03 DIAGNOSIS — Z79899 Other long term (current) drug therapy: Secondary | ICD-10-CM | POA: Diagnosis not present

## 2015-02-03 DIAGNOSIS — E569 Vitamin deficiency, unspecified: Secondary | ICD-10-CM | POA: Diagnosis not present

## 2015-02-03 DIAGNOSIS — E780 Pure hypercholesterolemia: Secondary | ICD-10-CM | POA: Diagnosis not present

## 2015-02-03 DIAGNOSIS — E559 Vitamin D deficiency, unspecified: Secondary | ICD-10-CM | POA: Diagnosis not present

## 2015-02-03 DIAGNOSIS — Z23 Encounter for immunization: Secondary | ICD-10-CM | POA: Diagnosis not present

## 2015-02-03 DIAGNOSIS — E538 Deficiency of other specified B group vitamins: Secondary | ICD-10-CM | POA: Diagnosis not present

## 2015-02-06 DIAGNOSIS — E1165 Type 2 diabetes mellitus with hyperglycemia: Secondary | ICD-10-CM | POA: Diagnosis not present

## 2015-02-06 DIAGNOSIS — Z1389 Encounter for screening for other disorder: Secondary | ICD-10-CM | POA: Diagnosis not present

## 2015-02-06 DIAGNOSIS — Z9181 History of falling: Secondary | ICD-10-CM | POA: Diagnosis not present

## 2015-02-06 DIAGNOSIS — N189 Chronic kidney disease, unspecified: Secondary | ICD-10-CM | POA: Diagnosis not present

## 2015-02-06 DIAGNOSIS — I10 Essential (primary) hypertension: Secondary | ICD-10-CM | POA: Diagnosis not present

## 2015-02-27 DIAGNOSIS — H401121 Primary open-angle glaucoma, left eye, mild stage: Secondary | ICD-10-CM | POA: Diagnosis not present

## 2015-03-02 DIAGNOSIS — N3020 Other chronic cystitis without hematuria: Secondary | ICD-10-CM | POA: Diagnosis not present

## 2015-03-02 DIAGNOSIS — N3946 Mixed incontinence: Secondary | ICD-10-CM | POA: Diagnosis not present

## 2015-03-02 DIAGNOSIS — R350 Frequency of micturition: Secondary | ICD-10-CM | POA: Diagnosis not present

## 2015-04-17 DIAGNOSIS — K5901 Slow transit constipation: Secondary | ICD-10-CM | POA: Diagnosis not present

## 2015-04-17 DIAGNOSIS — K625 Hemorrhage of anus and rectum: Secondary | ICD-10-CM | POA: Diagnosis not present

## 2015-04-20 ENCOUNTER — Ambulatory Visit: Payer: Medicare Other | Admitting: Cardiology

## 2015-04-26 ENCOUNTER — Encounter: Payer: Self-pay | Admitting: Sports Medicine

## 2015-04-26 ENCOUNTER — Ambulatory Visit (INDEPENDENT_AMBULATORY_CARE_PROVIDER_SITE_OTHER): Payer: Medicare Other | Admitting: Sports Medicine

## 2015-04-26 DIAGNOSIS — M2141 Flat foot [pes planus] (acquired), right foot: Secondary | ICD-10-CM

## 2015-04-26 DIAGNOSIS — L89891 Pressure ulcer of other site, stage 1: Secondary | ICD-10-CM | POA: Diagnosis not present

## 2015-04-26 DIAGNOSIS — M2142 Flat foot [pes planus] (acquired), left foot: Secondary | ICD-10-CM

## 2015-04-26 DIAGNOSIS — M79671 Pain in right foot: Secondary | ICD-10-CM

## 2015-04-26 DIAGNOSIS — E11621 Type 2 diabetes mellitus with foot ulcer: Secondary | ICD-10-CM | POA: Diagnosis not present

## 2015-04-26 DIAGNOSIS — L97519 Non-pressure chronic ulcer of other part of right foot with unspecified severity: Principal | ICD-10-CM

## 2015-04-26 DIAGNOSIS — L03119 Cellulitis of unspecified part of limb: Secondary | ICD-10-CM

## 2015-04-26 DIAGNOSIS — L02619 Cutaneous abscess of unspecified foot: Secondary | ICD-10-CM

## 2015-04-26 DIAGNOSIS — E1142 Type 2 diabetes mellitus with diabetic polyneuropathy: Secondary | ICD-10-CM

## 2015-04-26 MED ORDER — CEPHALEXIN 500 MG PO CAPS: 500.0000 mg | ORAL_CAPSULE | Freq: Two times a day (BID) | ORAL | Status: DC

## 2015-04-26 NOTE — Progress Notes (Signed)
Patient ID: Emily Miranda, female   DOB: Apr 20, 1941, 74 y.o.   MRN: [id number] Subjective: Emily Miranda is a 74 y.o. female patient seen in office for evaluation of ulceration of the Right Arch/ Patient has a history of diabetes and a blood glucose level today that wasn't recorded however reports that her #s have been good. Patient is changing the dressing using silvadene and bandaid at home. Denies nausea/fever/vomiting/chills/night sweats/shortness of breath/pain. Patient has no other pedal complaints at this time.  Patient Active Problem List   Diagnosis Date Noted  . Abdominal pain, RLQ (right lower quadrant) -chronic since 2009 06/02/2013  . Constipation, chronic 06/02/2013  . SUI (stress urinary incontinence, female) 06/02/2013  . Recurrent UTI (urinary tract infection) 06/02/2013  . HTN (hypertension)   . CHEST PAIN 11/27/2009  . OVERWEIGHT 11/28/2008  . ESSENTIAL HYPERTENSION, BENIGN 11/28/2008  . DM 11/25/2008  . AORTIC STENOSIS 11/25/2008  . PANCREATITIS, HX OF 11/25/2008   Current Outpatient Prescriptions on File Prior to Visit  Medication Sig Dispense Refill  . amLODipine (NORVASC) 5 MG tablet Take 5 mg by mouth daily.     . furosemide (LASIX) 20 MG tablet Take 20 mg by mouth daily.     Marland Kitchen glimepiride (AMARYL) 4 MG tablet Take 4 mg by mouth daily with breakfast.    . lisinopril (PRINIVIL,ZESTRIL) 10 MG tablet Take 10 mg by mouth daily.    . nitrofurantoin, macrocrystal-monohydrate, (MACROBID) 100 MG capsule Take 100 mg by mouth 2 (two) times daily.     . TRADJENTA 5 MG TABS tablet Take 5 mg by mouth daily.     . traMADol (ULTRAM) 50 MG tablet Take 50 mg by mouth 2 (two) times daily.     . Ascorbic Acid (VITAMIN C) 100 MG tablet Take 100 mg by mouth daily.      . hydrochlorothiazide (HYDRODIURIL) 12.5 MG tablet Take 12.5 mg by mouth daily.      Marland Kitchen HYDROcodone-acetaminophen (NORCO) 5-325 MG per tablet Take 1 tablet by mouth every 6 (six) hours as needed. 10 tablet 0  .  methocarbamol (ROBAXIN) 500 MG tablet Take 1.5 tablets (750 mg total) by mouth every 8 (eight) hours as needed for muscle spasms (use for muscle cramps/pain). 30 tablet 2  . naproxen (NAPROSYN) 500 MG tablet Take 1 tablet (500 mg total) by mouth 2 (two) times daily with a meal. 40 tablet 1  . ONE TOUCH ULTRA TEST test strip 1 each by Other route once a week.     Marland Kitchen Phenyleph-Doxylamine-DM-APAP (ALKA SELTZER PLUS PO) Take 1 tablet by mouth daily as needed (congestion).    . pregabalin (LYRICA) 75 MG capsule Take 1 capsule (75 mg total) by mouth 2 (two) times daily. (Patient not taking: Reported on 08/08/2014) 60 capsule 1   No current facility-administered medications on file prior to visit.   Allergies  Allergen Reactions  . Levofloxacin Nausea And Vomiting    Other reaction(s): Confusion    Objective: General: Patient is awake, alert, oriented x 3 and in no acute distress.  Dermatology: Skin is warm and dry bilateral with a partial thickness ulceration present  Right proximal medial arch. Ulceration measures 1 x 0.5 x 0.3cm in total with a small bridge of epithelizing tissue separating the ulceration. There is a mildly erythematous border with a granular base. The ulceration does not probe to bone. There is no malodor, no active drainage. There is mild warmth and edema to the ulcer site. There are no other acute  signs of infection.   Vascular: Dorsalis Pedis pulse = 2/4 Bilateral,  Posterior Tibial pulse = 1/4 Bilateral,  Capillary Fill Time < 5 seconds  Neurologic: Epicritic sensation severely diminished to the level of ankle using  the 5.07/10g [employer].  Musculosketal: There is decreased ankle joint range of motion Bilateral. There is decreased Subtalar joint range of motion Bilateral. There is a decrease in 1st metatarsophalangeal joint range of motion Bilateral with significant Pes Planus and talonavicular buldge noted on the right>left foot. No gross pain with  palpation to ulcerated area. No pain with compression to calves bilateral.   Assessment and Plan:  Problem List Items Addressed This Visit    None    Visit Diagnoses    Diabetic ulcer of right foot associated with type 2 diabetes mellitus (HCC)    -  Primary    Cellulitis and abscess of foot, except toes        Diabetic polyneuropathy associated with type 2 diabetes mellitus (HCC)        Right foot pain        Pes planus of both feet        R>L with plantar TN prominence        -Examined patient and discussed the progression of the wound and treatment alternatives. - Excisionally dedbrided ulceration to healthy bleeding borders using a sterile chisel blade. -Applied offloading pads and silvadene cream and dry sterile dressing and instructed patient to continue with daily dressings at home consisting of silvadene of which she already owns and bandaid/dry sterile dressing. -Rx Keflex  bid for preventive measures in the setting of low grade infection -Dispensed post op shoe to assist with offloading the area - Advised patient to go to the ER or return to office if the wound worsens or if constitutional symptoms are present. -Patient to return to office in 1 week for follow up care and evaluation or sooner if problems arise.  Asencion Islam, DPM

## 2015-05-04 ENCOUNTER — Encounter: Payer: Self-pay | Admitting: Sports Medicine

## 2015-05-04 ENCOUNTER — Ambulatory Visit (INDEPENDENT_AMBULATORY_CARE_PROVIDER_SITE_OTHER): Payer: Medicare Other | Admitting: Sports Medicine

## 2015-05-04 DIAGNOSIS — M2142 Flat foot [pes planus] (acquired), left foot: Secondary | ICD-10-CM

## 2015-05-04 DIAGNOSIS — L03119 Cellulitis of unspecified part of limb: Secondary | ICD-10-CM

## 2015-05-04 DIAGNOSIS — M79671 Pain in right foot: Secondary | ICD-10-CM | POA: Diagnosis not present

## 2015-05-04 DIAGNOSIS — M2141 Flat foot [pes planus] (acquired), right foot: Secondary | ICD-10-CM | POA: Diagnosis not present

## 2015-05-04 DIAGNOSIS — L02619 Cutaneous abscess of unspecified foot: Secondary | ICD-10-CM | POA: Diagnosis not present

## 2015-05-04 DIAGNOSIS — E1142 Type 2 diabetes mellitus with diabetic polyneuropathy: Secondary | ICD-10-CM | POA: Diagnosis not present

## 2015-05-04 DIAGNOSIS — L97519 Non-pressure chronic ulcer of other part of right foot with unspecified severity: Secondary | ICD-10-CM | POA: Diagnosis not present

## 2015-05-04 DIAGNOSIS — E11621 Type 2 diabetes mellitus with foot ulcer: Secondary | ICD-10-CM | POA: Diagnosis not present

## 2015-05-04 NOTE — Progress Notes (Signed)
Patient ID: Emily Miranda, female   DOB: May 04, 1941, 74 y.o.   MRN: [id number]  Subjective: Emily Miranda is a 74 y.o.  diabetic female patient returns to office for follow up eval of right foot ulceration. Patient is on Keflex with no adverse reaction. Patient is changing the dressing using silvadene and bandaid at home. Denies nausea/fever/vomiting/chills/night sweats/shortness of breath/pain. Patient has no other pedal complaints at this time.  Patient Active Problem List   Diagnosis Date Noted  . Abdominal pain, RLQ (right lower quadrant) -chronic since 2009 06/02/2013  . Constipation, chronic 06/02/2013  . SUI (stress urinary incontinence, female) 06/02/2013  . Recurrent UTI (urinary tract infection) 06/02/2013  . HTN (hypertension)   . CHEST PAIN 11/27/2009  . OVERWEIGHT 11/28/2008  . ESSENTIAL HYPERTENSION, BENIGN 11/28/2008  . DM 11/25/2008  . AORTIC STENOSIS 11/25/2008  . PANCREATITIS, HX OF 11/25/2008   Current Outpatient Prescriptions on File Prior to Visit  Medication Sig Dispense Refill  . amLODipine (NORVASC) 5 MG tablet Take 5 mg by mouth daily.     . Ascorbic Acid (VITAMIN C) 100 MG tablet Take 100 mg by mouth daily.      . cephALEXin (KEFLEX) 500 MG capsule Take 1 capsule (500 mg total) by mouth 2 (two) times daily. 28 capsule 0  . furosemide (LASIX) 20 MG tablet Take 20 mg by mouth daily.     Marland Kitchen glimepiride (AMARYL) 4 MG tablet Take 4 mg by mouth daily with breakfast.    . hydrochlorothiazide (HYDRODIURIL) 12.5 MG tablet Take 12.5 mg by mouth daily.      Marland Kitchen HYDROcodone-acetaminophen (NORCO) 5-325 MG per tablet Take 1 tablet by mouth every 6 (six) hours as needed. 10 tablet 0  . lisinopril (PRINIVIL,ZESTRIL) 10 MG tablet Take 10 mg by mouth daily.    . methocarbamol (ROBAXIN) 500 MG tablet Take 1.5 tablets (750 mg total) by mouth every 8 (eight) hours as needed for muscle spasms (use for muscle cramps/pain). 30 tablet 2  . naproxen (NAPROSYN) 500 MG tablet Take 1  tablet (500 mg total) by mouth 2 (two) times daily with a meal. 40 tablet 1  . nitrofurantoin, macrocrystal-monohydrate, (MACROBID) 100 MG capsule Take 100 mg by mouth 2 (two) times daily.     . ONE TOUCH ULTRA TEST test strip 1 each by Other route once a week.     Marland Kitchen Phenyleph-Doxylamine-DM-APAP (ALKA SELTZER PLUS PO) Take 1 tablet by mouth daily as needed (congestion).    . pregabalin (LYRICA) 75 MG capsule Take 1 capsule (75 mg total) by mouth 2 (two) times daily. (Patient not taking: Reported on 08/08/2014) 60 capsule 1  . TRADJENTA 5 MG TABS tablet Take 5 mg by mouth daily.     . traMADol (ULTRAM) 50 MG tablet Take 50 mg by mouth 2 (two) times daily.      No current facility-administered medications on file prior to visit.   Allergies  Allergen Reactions  . Levofloxacin Nausea And Vomiting    Other reaction(s): Confusion    Objective: General: Patient is awake, alert, oriented x 3 and in no acute distress.  Dermatology: Skin is warm and dry bilateral with a partial thickness ulceration present  Right proximal medial arch. Ulceration measures 0.5x0.5x0.2cm (last measurement 1 x 0.5 x 0.3cm) circular in nature with granular base. Decreased erythema. The ulceration does not probe to bone. There is no malodor, no active drainage. There is decreased warmth and edema to the ulcer site. There are no other acute signs  of infection.   Vascular: Dorsalis Pedis pulse = 2/4 Bilateral,  Posterior Tibial pulse = 1/4 Bilateral,  Capillary Fill Time < 5 seconds  Neurologic: Epicritic sensation severely diminished to the level of ankle using the 5.07/10g [employer].  Musculosketal: There is decreased ankle joint range of motion Bilateral. There is decreased Subtalar joint range of motion Bilateral. There is a decrease in 1st metatarsophalangeal joint range of motion Bilateral with significant Pes Planus and talonavicular buldge noted on the right>left foot. No gross pain with palpation  to ulcerated area. No pain with compression to calves bilateral.   Assessment and Plan:  Problem List Items Addressed This Visit    None    Visit Diagnoses    Diabetic ulcer of right foot associated with type 2 diabetes mellitus (HCC)    -  Primary    medial arch    Cellulitis and abscess of foot, except toes        Diabetic polyneuropathy associated with type 2 diabetes mellitus (HCC)        Right foot pain        Pes planus of both feet        R>L with TN prominence       -Examined patient and discussed the progression of the wound and treatment alternatives. -Nonselectively dedbrided ulceration to healthy bleeding borders using a moist guaze. -Applied offloading pads and silvadene cream and dry sterile dressing and instructed patient to continue with daily dressings at home consisting of silvadene of which she already owns and bandaid/dry sterile dressing. -Cont with Keflex until course is complete -Cont with post op shoe to assist with offloading the area -Advised patient to go to the ER or return to office if the wound worsens or if constitutional symptoms are present. -Patient to return to office in 2 weeks for follow up care and evaluation or sooner if problems arise.  Asencion Islam, DPM

## 2015-05-10 ENCOUNTER — Ambulatory Visit (INDEPENDENT_AMBULATORY_CARE_PROVIDER_SITE_OTHER): Payer: Medicare Other | Admitting: Sports Medicine

## 2015-05-10 ENCOUNTER — Encounter: Payer: Self-pay | Admitting: Sports Medicine

## 2015-05-10 ENCOUNTER — Ambulatory Visit (INDEPENDENT_AMBULATORY_CARE_PROVIDER_SITE_OTHER): Payer: Medicare Other

## 2015-05-10 DIAGNOSIS — M71572 Other bursitis, not elsewhere classified, left ankle and foot: Secondary | ICD-10-CM

## 2015-05-10 DIAGNOSIS — M79672 Pain in left foot: Secondary | ICD-10-CM

## 2015-05-10 DIAGNOSIS — M7752 Other enthesopathy of left foot: Secondary | ICD-10-CM

## 2015-05-10 MED ORDER — TRIAMCINOLONE ACETONIDE 10 MG/ML IJ SUSP: 10.0000 mg | Freq: Once | INTRAMUSCULAR | Status: DC

## 2015-05-10 NOTE — Progress Notes (Signed)
Patient ID: Emily Miranda, female   DOB: 11/06/1940, 74 y.o.   MRN: [id number] Subjective: Emily Miranda is a 74 y.o. diabetic female patient who presents to office for evaluation of Left foot pain. Patient complains of progressive pain since Saturday in the left heel. Reports that it feels like her left heel is broken. Admits to a previous history many years ago of a left heel fracture. Admits to doing a little bit more activity and walking over the Christmas holiday. Patient reports that she has tried ice taking her pain medications with minimal relief. Patient denies any other pedal complaints. Denies injury/trip/fall/sprain/any causative factors.   Patient is also seeing me for a right foot ulceration of which we will recheck next week. Patient denies any constitutional symptoms at this time.  Patient Active Problem List   Diagnosis Date Noted  . Abdominal pain, RLQ (right lower quadrant) -chronic since 2009 06/02/2013  . Constipation, chronic 06/02/2013  . SUI (stress urinary incontinence, female) 06/02/2013  . Recurrent UTI (urinary tract infection) 06/02/2013  . HTN (hypertension)   . CHEST PAIN 11/27/2009  . OVERWEIGHT 11/28/2008  . ESSENTIAL HYPERTENSION, BENIGN 11/28/2008  . DM 11/25/2008  . AORTIC STENOSIS 11/25/2008  . PANCREATITIS, HX OF 11/25/2008   Current Outpatient Prescriptions on File Prior to Visit  Medication Sig Dispense Refill  . amLODipine (NORVASC) 5 MG tablet Take 5 mg by mouth daily.     . Ascorbic Acid (VITAMIN C) 100 MG tablet Take 100 mg by mouth daily.      . cephALEXin (KEFLEX) 500 MG capsule Take 1 capsule (500 mg total) by mouth 2 (two) times daily. 28 capsule 0  . furosemide (LASIX) 20 MG tablet Take 20 mg by mouth daily.     Marland Kitchen glimepiride (AMARYL) 4 MG tablet Take 4 mg by mouth daily with breakfast.    . hydrochlorothiazide (HYDRODIURIL) 12.5 MG tablet Take 12.5 mg by mouth daily.      Marland Kitchen HYDROcodone-acetaminophen (NORCO) 5-325 MG per tablet Take 1  tablet by mouth every 6 (six) hours as needed. 10 tablet 0  . lisinopril (PRINIVIL,ZESTRIL) 10 MG tablet Take 10 mg by mouth daily.    . methocarbamol (ROBAXIN) 500 MG tablet Take 1.5 tablets (750 mg total) by mouth every 8 (eight) hours as needed for muscle spasms (use for muscle cramps/pain). 30 tablet 2  . naproxen (NAPROSYN) 500 MG tablet Take 1 tablet (500 mg total) by mouth 2 (two) times daily with a meal. 40 tablet 1  . nitrofurantoin, macrocrystal-monohydrate, (MACROBID) 100 MG capsule Take 100 mg by mouth 2 (two) times daily.     . ONE TOUCH ULTRA TEST test strip 1 each by Other route once a week.     Marland Kitchen Phenyleph-Doxylamine-DM-APAP (ALKA SELTZER PLUS PO) Take 1 tablet by mouth daily as needed (congestion).    . pregabalin (LYRICA) 75 MG capsule Take 1 capsule (75 mg total) by mouth 2 (two) times daily. (Patient not taking: Reported on 08/08/2014) 60 capsule 1  . TRADJENTA 5 MG TABS tablet Take 5 mg by mouth daily.     . traMADol (ULTRAM) 50 MG tablet Take 50 mg by mouth 2 (two) times daily.      No current facility-administered medications on file prior to visit.   Allergies  Allergen Reactions  . Levofloxacin Nausea And Vomiting    Other reaction(s): Confusion     Objective:  General: Alert and oriented x3 in no acute distress  Focused left foot exam Dermatology:  No open lesions, no webspace macerations, no ecchymosis, nails are within normal limits  Vascular: Dorsalis Pedis 2/4 and Posterior Tibial pedal pulses 1/4, Capillary Fill Time 3 seconds, scant pedal hair growth, no edema, Temperature gradient within normal limits.  Neurology: Gross sensation intact via light touch, protective and epicritic sensation severely diminished.   Musculoskeletal: Moderate tenderness with palpation of the calcaneus at the calcaneal tubercle and plantar central heel. There is no increase in discomfort with application of tuning fork to calcaneus. There is no lower extremity edema or pain with  calf compression. Range of motion is within normal limits for condition. Strength is also within normal limits. There is significant pes planus with talonavicular bulge present.   Xrays  Left Foot    Impression: There is mild decrease osseous mineralization. There is no fracture or dislocation present, specifically at the area of concern to calcaneus. There is significant posterior and inferior heel spurs and pes planus deformity with significant arthritis throughout the foot. There is hardware noted first metatarsal and lesser digits, consistent with previous surgery, soft tissues appear to be within normal limits. There are no foreign bodies.  Assessment and Plan: Problem List Items Addressed This Visit    None    Visit Diagnoses    Left foot pain    -  Primary    Relevant Medications    triamcinolone acetonide (KENALOG) 10 MG/ML injection 10 mg (Start on 05/10/2015  5:30 PM)    Other Relevant Orders    DG Foot 2 Views Left    Bursitis of foot, left        Relevant Medications    triamcinolone acetonide (KENALOG) 10 MG/ML injection 10 mg (Start on 05/10/2015  5:30 PM)       -Complete examination performed -Xrays reviewed -Discussed treatement options for calcaneal bursitis, which can be likely secondary to compensation because overload and use of postop shoe on right foot for a concurrent ulceration that she is seeing me for -After verbal consent injected 3 mL mixture of 1% lidocaine plain, 0.5% Marcaine plain, dexamethasone phosphate and Kenalog 10 into the left heel at site of most tenderness and pain. Patient tolerated the injection well without complication. -Dispense a postoperative shoe for left foot to alleviate heel pressure and to help balance the patient out and make her level on both sides -Recommend ice, elevation, and pain medications as needed -Patient to return to office next week for recheck of left heel pain and for recheck of right foot ulceration with a more thorough  exam will be performed at this site in the meantime, patient to continue with dressing the area and antibiotics as previously prescribed and office visit from 12-22 or sooner if condition worsens.  Asencion Islam, DPM

## 2015-05-18 ENCOUNTER — Ambulatory Visit (INDEPENDENT_AMBULATORY_CARE_PROVIDER_SITE_OTHER): Payer: Medicare Other | Admitting: Sports Medicine

## 2015-05-18 DIAGNOSIS — E1142 Type 2 diabetes mellitus with diabetic polyneuropathy: Secondary | ICD-10-CM

## 2015-05-18 DIAGNOSIS — M2141 Flat foot [pes planus] (acquired), right foot: Secondary | ICD-10-CM | POA: Diagnosis not present

## 2015-05-18 DIAGNOSIS — L03119 Cellulitis of unspecified part of limb: Secondary | ICD-10-CM

## 2015-05-18 DIAGNOSIS — L02619 Cutaneous abscess of unspecified foot: Secondary | ICD-10-CM

## 2015-05-18 DIAGNOSIS — L97519 Non-pressure chronic ulcer of other part of right foot with unspecified severity: Secondary | ICD-10-CM

## 2015-05-18 DIAGNOSIS — M79671 Pain in right foot: Secondary | ICD-10-CM

## 2015-05-18 DIAGNOSIS — E11621 Type 2 diabetes mellitus with foot ulcer: Secondary | ICD-10-CM

## 2015-05-18 DIAGNOSIS — M71572 Other bursitis, not elsewhere classified, left ankle and foot: Secondary | ICD-10-CM | POA: Diagnosis not present

## 2015-05-18 DIAGNOSIS — M2142 Flat foot [pes planus] (acquired), left foot: Secondary | ICD-10-CM | POA: Diagnosis not present

## 2015-05-18 DIAGNOSIS — M7752 Other enthesopathy of left foot: Secondary | ICD-10-CM

## 2015-05-18 NOTE — Progress Notes (Signed)
Patient ID: Emily Miranda, female   DOB: 1941-02-15, 75 y.o.   MRN: [id number]  Subjective: Emily Miranda is a 75 y.o.  diabetic female patient returns to office for follow up eval of right foot ulceration  The left heel pain;   Status post heel injection. Patient is on Keflex  For right foot ulcer with no adverse reaction. Patient is changing the dressing using silvadene and bandaid at home. Denies nausea/fever/vomiting/chills/night sweats/shortness of breath/pain. Patient has no other pedal complaints at this time.  FBS 96  Patient Active Problem List   Diagnosis Date Noted  . Abdominal pain, RLQ (right lower quadrant) -chronic since 2009 06/02/2013  . Constipation, chronic 06/02/2013  . SUI (stress urinary incontinence, female) 06/02/2013  . Recurrent UTI (urinary tract infection) 06/02/2013  . HTN (hypertension)   . CHEST PAIN 11/27/2009  . OVERWEIGHT 11/28/2008  . ESSENTIAL HYPERTENSION, BENIGN 11/28/2008  . DM 11/25/2008  . AORTIC STENOSIS 11/25/2008  . PANCREATITIS, HX OF 11/25/2008   Current Outpatient Prescriptions on File Prior to Visit  Medication Sig Dispense Refill  . amLODipine (NORVASC) 5 MG tablet Take 5 mg by mouth daily.     . Ascorbic Acid (VITAMIN C) 100 MG tablet Take 100 mg by mouth daily.      . cephALEXin (KEFLEX) 500 MG capsule Take 1 capsule (500 mg total) by mouth 2 (two) times daily. 28 capsule 0  . furosemide (LASIX) 20 MG tablet Take 20 mg by mouth daily.     Marland Kitchen glimepiride (AMARYL) 4 MG tablet Take 4 mg by mouth daily with breakfast.    . hydrochlorothiazide (HYDRODIURIL) 12.5 MG tablet Take 12.5 mg by mouth daily.      Marland Kitchen HYDROcodone-acetaminophen (NORCO) 5-325 MG per tablet Take 1 tablet by mouth every 6 (six) hours as needed. 10 tablet 0  . lisinopril (PRINIVIL,ZESTRIL) 10 MG tablet Take 10 mg by mouth daily.    . methocarbamol (ROBAXIN) 500 MG tablet Take 1.5 tablets (750 mg total) by mouth every 8 (eight) hours as needed for muscle spasms (use  for muscle cramps/pain). 30 tablet 2  . naproxen (NAPROSYN) 500 MG tablet Take 1 tablet (500 mg total) by mouth 2 (two) times daily with a meal. 40 tablet 1  . nitrofurantoin, macrocrystal-monohydrate, (MACROBID) 100 MG capsule Take 100 mg by mouth 2 (two) times daily.     . ONE TOUCH ULTRA TEST test strip 1 each by Other route once a week.     Marland Kitchen Phenyleph-Doxylamine-DM-APAP (ALKA SELTZER PLUS PO) Take 1 tablet by mouth daily as needed (congestion).    . pregabalin (LYRICA) 75 MG capsule Take 1 capsule (75 mg total) by mouth 2 (two) times daily. (Patient not taking: Reported on 08/08/2014) 60 capsule 1  . TRADJENTA 5 MG TABS tablet Take 5 mg by mouth daily.     . traMADol (ULTRAM) 50 MG tablet Take 50 mg by mouth 2 (two) times daily.      Current Facility-Administered Medications on File Prior to Visit  Medication Dose Route Frequency Provider Last Rate Last Dose  . triamcinolone acetonide (KENALOG) 10 MG/ML injection 10 mg  10 mg Other Once Asencion Islam, DPM       Allergies  Allergen Reactions  . Levofloxacin Nausea And Vomiting    Other reaction(s): Confusion    Objective: General: Patient is awake, alert, oriented x 3 and in no acute distress.  Dermatology: Skin is warm and dry bilateral with a partial thickness ulceration present  Right proximal  medial arch. Ulceration measures0.5x0.4x0.2cm (last measurement 0.5x0.5x0.2cm) circular in nature with granular base. Decreased erythema. The ulceration does not probe to bone. There is no malodor, no active drainage. There is decreased warmth and edema to the ulcer site. There are no other acute signs of infection.   Vascular: Dorsalis Pedis pulse = 2/4 Bilateral,  Posterior Tibial pulse = 1/4 Bilateral,  Capillary Fill Time < 5 seconds  Neurologic: Epicritic sensation severely diminished to the level of ankle using the 5.07/10g [employer].  Musculosketal:  No pain with palpation to the ulceration site right foot. there  is no pain with palpation to left heel today's exam. There is decreased ankle joint range of motion Bilateral. There is decreased Subtalar joint range of motion Bilateral. There is a decrease in 1st metatarsophalangeal joint range of motion Bilateral with significant Pes Planus and talonavicular buldge noted on the right>left foot. No pain with compression to calves bilateral.  Strength within normal limits bilateral.   Assessment and Plan:  Problem List Items Addressed This Visit    None    Visit Diagnoses    Diabetic ulcer of right foot associated with type 2 diabetes mellitus (HCC)    -  Primary     plantar medial midfoot    Cellulitis and abscess of foot, except toes        Improving    Diabetic polyneuropathy associated with type 2 diabetes mellitus (HCC)        Right foot pain        Pes planus of both feet        Bursitis of foot, left        Resolved       -Examined patient and discussed the progression of the wound and treatment alternatives. - Mechanically debrided Ulceration, right foot to healthy bleeding borders utilizing a sterile chisel blade -Applied offloading pad and silvadene cream and dry sterile dressing and instructed patient to continue with daily dressings at home consisting of silvadene of which she already owns and bandaid/dry sterile dressing. -Cont with Keflex until course is complete -Cont with post op shoe to assist with offloading the area on right foot -Cont with postop shoe to left foot to continue to alleviate pressure to left heel and to watch for signs of recurrence of bursitis -Advised patient to go to the ER or return to office if the wound worsens or if constitutional symptoms are present. -Patient to return to office in 2 weeks for follow up care and evaluation or sooner if problems arise.  Asencion Islam, DPM

## 2015-06-01 ENCOUNTER — Encounter: Payer: Self-pay | Admitting: Sports Medicine

## 2015-06-01 ENCOUNTER — Ambulatory Visit (INDEPENDENT_AMBULATORY_CARE_PROVIDER_SITE_OTHER): Payer: Medicare Other | Admitting: Sports Medicine

## 2015-06-01 DIAGNOSIS — E1142 Type 2 diabetes mellitus with diabetic polyneuropathy: Secondary | ICD-10-CM

## 2015-06-01 DIAGNOSIS — M2142 Flat foot [pes planus] (acquired), left foot: Secondary | ICD-10-CM

## 2015-06-01 DIAGNOSIS — E11621 Type 2 diabetes mellitus with foot ulcer: Secondary | ICD-10-CM

## 2015-06-01 DIAGNOSIS — L97519 Non-pressure chronic ulcer of other part of right foot with unspecified severity: Secondary | ICD-10-CM

## 2015-06-01 DIAGNOSIS — M71572 Other bursitis, not elsewhere classified, left ankle and foot: Secondary | ICD-10-CM

## 2015-06-01 DIAGNOSIS — M7752 Other enthesopathy of left foot: Secondary | ICD-10-CM

## 2015-06-01 DIAGNOSIS — L89891 Pressure ulcer of other site, stage 1: Secondary | ICD-10-CM | POA: Diagnosis not present

## 2015-06-01 DIAGNOSIS — M79672 Pain in left foot: Secondary | ICD-10-CM

## 2015-06-01 DIAGNOSIS — M2141 Flat foot [pes planus] (acquired), right foot: Secondary | ICD-10-CM

## 2015-06-01 DIAGNOSIS — L03119 Cellulitis of unspecified part of limb: Secondary | ICD-10-CM

## 2015-06-01 DIAGNOSIS — L97501 Non-pressure chronic ulcer of other part of unspecified foot limited to breakdown of skin: Secondary | ICD-10-CM

## 2015-06-01 DIAGNOSIS — L02619 Cutaneous abscess of unspecified foot: Secondary | ICD-10-CM

## 2015-06-01 DIAGNOSIS — M79671 Pain in right foot: Secondary | ICD-10-CM

## 2015-06-01 NOTE — Progress Notes (Signed)
Patient ID: Emily Miranda, female   DOB: Mar 31, 1941, 75 y.o.   MRN: [id number]  Subjective: Emily Miranda is a 75 y.o.  diabetic female patient returns to office for follow up eval of right foot ulceration and left heel pain; Status post heel injection Administered 3 weeks ago. Patient has completed Keflex for right foot ulcer with no adverse reaction. Patient is changing the dressing using silvadene and bandaid at home. Denies nausea/fever/vomiting/chills/night sweats/shortness of breath/pain.  Patient states that left heel is doing a little better however, occasionally has pain with walking.  Patient has no other pedal complaints at this time.  FBS 98  Patient Active Problem List   Diagnosis Date Noted  . Abdominal pain, RLQ (right lower quadrant) -chronic since 2009 06/02/2013  . Constipation, chronic 06/02/2013  . SUI (stress urinary incontinence, female) 06/02/2013  . Recurrent UTI (urinary tract infection) 06/02/2013  . HTN (hypertension)   . CHEST PAIN 11/27/2009  . OVERWEIGHT 11/28/2008  . ESSENTIAL HYPERTENSION, BENIGN 11/28/2008  . DM 11/25/2008  . AORTIC STENOSIS 11/25/2008  . PANCREATITIS, HX OF 11/25/2008   Current Outpatient Prescriptions on File Prior to Visit  Medication Sig Dispense Refill  . amLODipine (NORVASC) 5 MG tablet Take 5 mg by mouth daily.     . Ascorbic Acid (VITAMIN C) 100 MG tablet Take 100 mg by mouth daily.      . cephALEXin (KEFLEX) 500 MG capsule Take 1 capsule (500 mg total) by mouth 2 (two) times daily. 28 capsule 0  . furosemide (LASIX) 20 MG tablet Take 20 mg by mouth daily.     Marland Kitchen glimepiride (AMARYL) 4 MG tablet Take 4 mg by mouth daily with breakfast.    . hydrochlorothiazide (HYDRODIURIL) 12.5 MG tablet Take 12.5 mg by mouth daily.      Marland Kitchen HYDROcodone-acetaminophen (NORCO) 5-325 MG per tablet Take 1 tablet by mouth every 6 (six) hours as needed. 10 tablet 0  . lisinopril (PRINIVIL,ZESTRIL) 10 MG tablet Take 10 mg by mouth daily.    .  methocarbamol (ROBAXIN) 500 MG tablet Take 1.5 tablets (750 mg total) by mouth every 8 (eight) hours as needed for muscle spasms (use for muscle cramps/pain). 30 tablet 2  . naproxen (NAPROSYN) 500 MG tablet Take 1 tablet (500 mg total) by mouth 2 (two) times daily with a meal. 40 tablet 1  . nitrofurantoin, macrocrystal-monohydrate, (MACROBID) 100 MG capsule Take 100 mg by mouth 2 (two) times daily.     . ONE TOUCH ULTRA TEST test strip 1 each by Other route once a week.     Marland Kitchen Phenyleph-Doxylamine-DM-APAP (ALKA SELTZER PLUS PO) Take 1 tablet by mouth daily as needed (congestion).    . pregabalin (LYRICA) 75 MG capsule Take 1 capsule (75 mg total) by mouth 2 (two) times daily. (Patient not taking: Reported on 08/08/2014) 60 capsule 1  . TRADJENTA 5 MG TABS tablet Take 5 mg by mouth daily.     . traMADol (ULTRAM) 50 MG tablet Take 50 mg by mouth 2 (two) times daily.      Current Facility-Administered Medications on File Prior to Visit  Medication Dose Route Frequency Provider Last Rate Last Dose  . triamcinolone acetonide (KENALOG) 10 MG/ML injection 10 mg  10 mg Other Once Asencion Islam, DPM       Allergies  Allergen Reactions  . Levofloxacin Nausea And Vomiting    Other reaction(s): Confusion    Objective: General: Patient is awake, alert, oriented x 3 and in no acute  distress.  Dermatology: Skin is warm and dry bilateral with a partial thickness ulceration present Right proximal medial arch. Ulceration measures post debridement 1 cm x 1 cm x 0.4 cm (last measurement 0.5x0.4x0.2cm) circular in nature with granular base. No erythema. The ulceration does not probe to bone. There is no malodor, no active drainage. There is no warmth and decreased edema to the ulcer site. There are no other acute signs of infection.   Vascular: Dorsalis Pedis pulse = 2/4 Bilateral,  Posterior Tibial pulse = 1/4 Bilateral,  Capillary Fill Time < 5 seconds  Neurologic: Epicritic sensation severely diminished to  the level of ankle using the 5.07/10g [employer].  Musculosketal:  No pain with palpation to the ulceration site right foot. There is  Minimal  Pain with palpation to left heel. There is decreased ankle joint range of motion Bilateral. There is decreased Subtalar joint range of motion Bilateral. There is a decrease in 1st metatarsophalangeal joint range of motion Bilateral with significant Pes Planus and talonavicular buldge noted on the right>left foot, Residual chronic non-active Charcot foot . No pain with compression to calves bilateral.  Strength within normal limits bilateral.   Assessment and Plan:  Problem List Items Addressed This Visit    None    Visit Diagnoses    Cellulitis and abscess of foot, except toes    -  Primary    Diabetic ulcer of right foot associated with type 2 diabetes mellitus (HCC)        Diabetic polyneuropathy associated with type 2 diabetes mellitus (HCC)        Right foot pain          -Examined patient and discussed the progression of the wound and treatment alternatives. - Mechanically debrided Ulceration, right foot to healthy bleeding borders utilizing a sterile chisel blade  and tissue nippers -Applied  Right foot ulcer offloading pad and silvadene cream and dry sterile dressing and instructed patient to continue with daily dressings at home consisting of silvadene of which she already owns and bandaid/dry sterile dressing. -Cont with post op shoe to assist with offloading the area on right foot -Cont with postop shoe to left foot to continue to alleviate pressure to left heel and to watch for signs of recurrence of bursitis;  Applied felt offloading heel pad to postop shoe to prevent reoccurrence -Advised patient to go to the ER or return to office if the wound worsens or if constitutional symptoms are present. -Patient to return to office in 1-2 weeks for follow up care and evaluation or sooner if problems arise.  May consider application  of wound biologic for Right foot ulceration pending approval.  Request was placed today for [employer].   Asencion Islam, DPM

## 2015-06-04 NOTE — Progress Notes (Signed)
HPI The patient presents for follow up of aortic stenosis.  I last saw her in 2015.  Echo in Jan of 2016 demonstrated only moderate AS.  Both feet for soft boots.   Ulcer on right foot and fracture heal on left.  With her level of activity she is doing OK.  The patient denies any new symptoms such as chest discomfort, neck or arm discomfort. There has been no new shortness of breath, PND or orthopnea. There have been no reported palpitations, presyncope or syncope.    Allergies  Allergen Reactions  . Levofloxacin Nausea And Vomiting    Other reaction(s): Confusion    Current Outpatient Prescriptions  Medication Sig Dispense Refill  . amLODipine (NORVASC) 5 MG tablet Take 5 mg by mouth daily.     . Ascorbic Acid (VITAMIN C) 100 MG tablet Take 100 mg by mouth daily.      . cephALEXin (KEFLEX) 500 MG capsule Take 1 capsule (500 mg total) by mouth 2 (two) times daily. 28 capsule 0  . furosemide (LASIX) 20 MG tablet Take 20 mg by mouth daily.     Marland Kitchen glimepiride (AMARYL) 4 MG tablet Take 4 mg by mouth daily with breakfast.    . hydrochlorothiazide (HYDRODIURIL) 12.5 MG tablet Take 12.5 mg by mouth daily.      Marland Kitchen HYDROcodone-acetaminophen (NORCO) 5-325 MG per tablet Take 1 tablet by mouth every 6 (six) hours as needed. 10 tablet 0  . lisinopril (PRINIVIL,ZESTRIL) 10 MG tablet Take 10 mg by mouth daily.    . methocarbamol (ROBAXIN) 500 MG tablet Take 1.5 tablets (750 mg total) by mouth every 8 (eight) hours as needed for muscle spasms (use for muscle cramps/pain). 30 tablet 2  . naproxen (NAPROSYN) 500 MG tablet Take 1 tablet (500 mg total) by mouth 2 (two) times daily with a meal. 40 tablet 1  . nitrofurantoin, macrocrystal-monohydrate, (MACROBID) 100 MG capsule Take 100 mg by mouth 2 (two) times daily.     . ONE TOUCH ULTRA TEST test strip 1 each by Other route once a week.     Marland Kitchen Phenyleph-Doxylamine-DM-APAP (ALKA SELTZER PLUS PO) Take 1 tablet by mouth daily as needed (congestion).    .  pregabalin (LYRICA) 75 MG capsule Take 1 capsule (75 mg total) by mouth 2 (two) times daily. 60 capsule 1  . TRADJENTA 5 MG TABS tablet Take 5 mg by mouth daily.     . traMADol (ULTRAM) 50 MG tablet Take 50 mg by mouth 2 (two) times daily.      Current Facility-Administered Medications  Medication Dose Route Frequency Provider Last Rate Last Dose  . triamcinolone acetonide (KENALOG) 10 MG/ML injection 10 mg  10 mg Other Once Asencion Islam, DPM        Past Medical History  Diagnosis Date  . Diabetes mellitus   . Pancreatitis   . Aortic stenosis, mild   . HTN (hypertension)   . Neuropathy (HCC)   . Charcot foot due to diabetes mellitus (HCC)   . Heart murmur   . Hyperlipidemia     Past Surgical History  Procedure Laterality Date  . Ovarian cyst removal    . Tonsillectomy    . Laparoscopic incisional / umbilical / ventral hernia repair  01/04/2008    Dr Bertram Savin  . Appendectomy    . Abdominal hysterectomy    . Laparoscopic lysis of adhesions  2007    Dr Donovan Kail  . Foot surgery    . Suture removal  08/17/2009    Dr Bertram Savin .  Right paramedian GoreTex stitch  . Robot assisted pyeloplasty  02/2007    Dr Laverle Patter    ROS:   As stated in the HPI and negative for all other systems.  PHYSICAL EXAM BP 162/80 mmHg  Pulse 62  Ht  (1.651 m)  Wt 160 lb (72.576 kg)  BMI 26.63 kg/m2 GENERAL:  Well appearing NECK:  No jugular venous distention, waveform within normal limits, carotid upstroke brisk and symmetric, no bruits, no thyromegaly LYMPHATICS:  No cervical, inguinal adenopathy LUNGS:  Clear to auscultation bilaterally BACK:  No CVA tenderness CHEST:  Unremarkable HEART:  PMI not displaced or sustained,S1 and S2 within normal limits, no S3, no S4, no clicks, no rubs.  Apical early peaking systolic murmur heard out the aortic outflow tract. ABD:  Flat, positive bowel sounds normal in frequency in pitch, no bruits, no rebound, no guarding, no midline pulsatile mass, no  hepatomegaly, no splenomegaly EXT:  2 plus pulses throughout, no edema, no cyanosis no clubbing    EKG:  Sinus rhythm, rate 62, axis within normal limits, intervals within normal limits, no acute ST-T wave changes, poor anterior R-wave progression  06/05/2015   ASSESSMENT AND PLAN   HTN (hypertension) -  I am going to change her from hydrochlorothiazide to chlorthalidone as this has a better result with blood pressure control.    AORTIC STENOSIS -  This hasn't changed on physical exam she is having no new symptoms. I will follow this clinically. Repeat echocardiography is not indicated at this time.

## 2015-06-05 ENCOUNTER — Ambulatory Visit (INDEPENDENT_AMBULATORY_CARE_PROVIDER_SITE_OTHER): Payer: Medicare Other | Admitting: Cardiology

## 2015-06-05 ENCOUNTER — Encounter: Payer: Self-pay | Admitting: Cardiology

## 2015-06-05 VITALS — BP 162/80 | HR 62 | Ht 65.0 in | Wt 160.0 lb

## 2015-06-05 DIAGNOSIS — I350 Nonrheumatic aortic (valve) stenosis: Secondary | ICD-10-CM

## 2015-06-05 MED ORDER — CHLORTHALIDONE 25 MG PO TABS
12.5000 mg | ORAL_TABLET | Freq: Every day | ORAL | Status: DC
Start: 2015-06-05 — End: 2016-05-31

## 2015-06-05 NOTE — Patient Instructions (Signed)
Dr Antoine Poche has recommended making the following medication changes: STOP HCTZ START Chlorthalidone 25 mg - take 0.5 tablet (12.5 mg total) by mouth daily  Your physician recommends that you schedule a follow-up appointment in 1 year. You will receive a reminder letter in the mail two months in advance. If you don't receive a letter, please call our office to schedule the follow-up appointment.  If you need a refill on your cardiac medications before your next appointment, please call your pharmacy.

## 2015-06-06 ENCOUNTER — Telehealth: Payer: Self-pay | Admitting: *Deleted

## 2015-06-06 NOTE — Telephone Encounter (Addendum)
-----   Message from Asencion Islam, [place] sent at 06/01/2015  5:05 PM EST ----- Regarding: [employer]  Hi Valery Can we work on Tour manager, Chiropractor for patient's right plantar midfoot diabetic ulceration that measures 1 cm x 1 cm x 0.4 cm in depth; patient has had ulceration greater than 4-6 weeks with minimal improvement in size with conservative wound care. I think the 82mm size graft would work for patient. Would like to apply it at her next office visit. Thanks,  Dr. Marylene Land.  06/05/2015 - FAXED required [facility] - Gra[employer] form, pt clinicals, and demographic.  06/06/2015 - RECEIVED corrections request from [facility] Reimbursement.  I left message asking Micah Noel for help with the corrections so I would understand better. 06/20/2015-FAXED REQUIRED FORM with dx change to L97.511 and 06/15/2015 clinicals and previous clinicals to [facility] as ordered by Dr. Marylene Land.  07/05/2015-PT LEFT MESSAGE STATES AT LAST VISIT Dr. Marylene Land said if her foot got redder to go to the ER.  Pt states she went to ER and now she is in the hospital on antibiotic, she wanted Dr. Marylene Land to know why she would not be at the appt on 07/06/2015.  07/05/2015-DR. STOVER REQUEST pt be seen in office as soon as possible after discharge from hospital to be treated for an infected ulcer.  Unable to leave message on cellphone rang 2 times then buzzed as if busy.

## 2015-06-14 ENCOUNTER — Ambulatory Visit: Payer: Medicare Other | Admitting: Sports Medicine

## 2015-06-15 ENCOUNTER — Encounter: Payer: Self-pay | Admitting: Sports Medicine

## 2015-06-15 ENCOUNTER — Ambulatory Visit (INDEPENDENT_AMBULATORY_CARE_PROVIDER_SITE_OTHER): Payer: Medicare Other | Admitting: Sports Medicine

## 2015-06-15 DIAGNOSIS — M71572 Other bursitis, not elsewhere classified, left ankle and foot: Secondary | ICD-10-CM | POA: Diagnosis not present

## 2015-06-15 DIAGNOSIS — L89891 Pressure ulcer of other site, stage 1: Secondary | ICD-10-CM | POA: Diagnosis not present

## 2015-06-15 DIAGNOSIS — M7752 Other enthesopathy of left foot: Secondary | ICD-10-CM

## 2015-06-15 DIAGNOSIS — M79671 Pain in right foot: Secondary | ICD-10-CM | POA: Diagnosis not present

## 2015-06-15 DIAGNOSIS — E1142 Type 2 diabetes mellitus with diabetic polyneuropathy: Secondary | ICD-10-CM | POA: Diagnosis not present

## 2015-06-15 DIAGNOSIS — M79672 Pain in left foot: Secondary | ICD-10-CM

## 2015-06-15 DIAGNOSIS — M2142 Flat foot [pes planus] (acquired), left foot: Secondary | ICD-10-CM

## 2015-06-15 DIAGNOSIS — E11621 Type 2 diabetes mellitus with foot ulcer: Secondary | ICD-10-CM

## 2015-06-15 DIAGNOSIS — L97511 Non-pressure chronic ulcer of other part of right foot limited to breakdown of skin: Secondary | ICD-10-CM

## 2015-06-15 DIAGNOSIS — M2141 Flat foot [pes planus] (acquired), right foot: Secondary | ICD-10-CM

## 2015-06-15 NOTE — Progress Notes (Addendum)
Patient ID: Emily Miranda, female   DOB: October 20, 1940, 75 y.o.   MRN: [id number]  Subjective: Emily Miranda is a 75 y.o.  diabetic female patient returns to office for follow up eval of right foot ulceration and left heel pain; Status post heel injection Administered 5 weeks ago. Patient states that her heel pain on left is all better. Patient is changing the dressing using silvadene and bandaid at home. Denies nausea/fever/vomiting/chills/night sweats/shortness of breath/pain.  Patient has no other pedal complaints at this time.  FBS 98  Patient Active Problem List   Diagnosis Date Noted  . Abdominal pain, RLQ (right lower quadrant) -chronic since 2009 06/02/2013  . Constipation, chronic 06/02/2013  . SUI (stress urinary incontinence, female) 06/02/2013  . Recurrent UTI (urinary tract infection) 06/02/2013  . HTN (hypertension)   . CHEST PAIN 11/27/2009  . OVERWEIGHT 11/28/2008  . ESSENTIAL HYPERTENSION, BENIGN 11/28/2008  . DM 11/25/2008  . AORTIC STENOSIS 11/25/2008  . PANCREATITIS, HX OF 11/25/2008   Current Outpatient Prescriptions on File Prior to Visit  Medication Sig Dispense Refill  . amLODipine (NORVASC) 5 MG tablet Take 5 mg by mouth daily.     . Ascorbic Acid (VITAMIN C) 100 MG tablet Take 100 mg by mouth daily.      . cephALEXin (KEFLEX) 500 MG capsule Take 1 capsule (500 mg total) by mouth 2 (two) times daily. 28 capsule 0  . chlorthalidone (HYGROTON) 25 MG tablet Take 0.5 tablets (12.5 mg total) by mouth daily. 15 tablet 11  . furosemide (LASIX) 20 MG tablet Take 20 mg by mouth daily.     Marland Kitchen glimepiride (AMARYL) 4 MG tablet Take 4 mg by mouth daily with breakfast.    . HYDROcodone-acetaminophen (NORCO) 5-325 MG per tablet Take 1 tablet by mouth every 6 (six) hours as needed. 10 tablet 0  . lisinopril (PRINIVIL,ZESTRIL) 10 MG tablet Take 10 mg by mouth daily.    . methocarbamol (ROBAXIN) 500 MG tablet Take 1.5 tablets (750 mg total) by mouth every 8 (eight) hours as  needed for muscle spasms (use for muscle cramps/pain). 30 tablet 2  . naproxen (NAPROSYN) 500 MG tablet Take 1 tablet (500 mg total) by mouth 2 (two) times daily with a meal. 40 tablet 1  . nitrofurantoin, macrocrystal-monohydrate, (MACROBID) 100 MG capsule Take 100 mg by mouth 2 (two) times daily.     . ONE TOUCH ULTRA TEST test strip 1 each by Other route once a week.     Marland Kitchen Phenyleph-Doxylamine-DM-APAP (ALKA SELTZER PLUS PO) Take 1 tablet by mouth daily as needed (congestion).    . pregabalin (LYRICA) 75 MG capsule Take 1 capsule (75 mg total) by mouth 2 (two) times daily. 60 capsule 1  . TRADJENTA 5 MG TABS tablet Take 5 mg by mouth daily.     . traMADol (ULTRAM) 50 MG tablet Take 50 mg by mouth 2 (two) times daily.      Current Facility-Administered Medications on File Prior to Visit  Medication Dose Route Frequency Provider Last Rate Last Dose  . triamcinolone acetonide (KENALOG) 10 MG/ML injection 10 mg  10 mg Other Once Asencion Islam, DPM       Allergies  Allergen Reactions  . Levofloxacin Nausea And Vomiting    Other reaction(s): Confusion    Objective: General: Patient is awake, alert, oriented x 3 and in no acute distress.  Dermatology: Skin is warm and dry bilateral with a partial thickness ulceration present Right proximal medial arch. Ulceration measures post  debridement 0.8 x 0.8 x 0.4cm (last measurement 1 cm x 1 cm x 0.4 cm) circular in nature with granular base. No erythema. The ulceration does not probe to bone. There is no malodor, no active drainage. There is no warmth and decreased edema to the ulcer site. There are no other acute signs of infection.   Vascular: Dorsalis Pedis pulse = 2/4 Bilateral,  Posterior Tibial pulse = 1/4 Bilateral,  Capillary Fill Time < 5 seconds  Neurologic: Epicritic sensation severely diminished to the level of ankle using the 5.07/10g [employer].  Musculosketal:  No pain with palpation to the ulceration site right  foot. There is no pain with palpation to left heel. There is decreased ankle joint range of motion Bilateral. There is decreased Subtalar joint range of motion Bilateral. There is a decrease in 1st metatarsophalangeal joint range of motion Bilateral with significant Pes Planus and talonavicular buldge noted on the right>left foot, Residual chronic non-active Charcot foot . No pain with compression to calves bilateral.  Strength within normal limits bilateral.   Assessment and Plan:  Problem List Items Addressed This Visit    None    Visit Diagnoses    Diabetic ulcer of right foot associated with type 2 diabetes mellitus, limited to breakdown of skin (HCC)    -  Primary    Diabetic polyneuropathy associated with type 2 diabetes mellitus (HCC)        Right foot pain        Bursitis of foot, left        resolved    Left foot pain        resolved    Pes planus of both feet          -Examined patient and discussed the progression of the wound and treatment alternatives. - Mechanically debrided Ulceration, right foot to healthy bleeding borders utilizing a sterile chisel blade   -Applied  Right foot ulcer silvadene cream and Mepilex border and instructed patient to continue with daily dressings at home consisting of silvadene and bandaid/dry sterile dressing. Will try to order border dressings for patient -Cont with post op shoe to assist with offloading the area on right foot -Cont with postop shoe to left foot to continue to alleviate pressure to left heel and to watch for signs of recurrence of bursitis -Advised patient to go to the ER or return to office if the wound worsens or if constitutional symptoms are present. -Patient to return to office in 1-2 weeks for follow up care and evaluation or sooner if problems arise.  Awaiting approval for [employer].   Asencion Islam, DPM

## 2015-06-20 NOTE — Telephone Encounter (Signed)
-----   Message from Asencion Islam, [place] sent at 06/20/2015 12:19 PM EST ----- Naaman Plummer I talked with Loraine Leriche from [facility] for graft for patient he said if we can re-submit the request using the L97.511 code with my last note. Thanks Dr. Marylene Land

## 2015-06-22 ENCOUNTER — Ambulatory Visit: Payer: Medicare Other | Admitting: Sports Medicine

## 2015-06-28 ENCOUNTER — Ambulatory Visit: Payer: Medicare Other | Admitting: Sports Medicine

## 2015-06-29 ENCOUNTER — Encounter: Payer: Self-pay | Admitting: Sports Medicine

## 2015-06-29 ENCOUNTER — Ambulatory Visit: Payer: Medicare Other | Admitting: Sports Medicine

## 2015-06-29 ENCOUNTER — Ambulatory Visit (INDEPENDENT_AMBULATORY_CARE_PROVIDER_SITE_OTHER): Payer: Medicare Other

## 2015-06-29 ENCOUNTER — Ambulatory Visit (INDEPENDENT_AMBULATORY_CARE_PROVIDER_SITE_OTHER): Payer: Medicare Other | Admitting: Sports Medicine

## 2015-06-29 DIAGNOSIS — M79673 Pain in unspecified foot: Secondary | ICD-10-CM | POA: Diagnosis not present

## 2015-06-29 DIAGNOSIS — E11621 Type 2 diabetes mellitus with foot ulcer: Secondary | ICD-10-CM | POA: Diagnosis not present

## 2015-06-29 DIAGNOSIS — L02619 Cutaneous abscess of unspecified foot: Secondary | ICD-10-CM

## 2015-06-29 DIAGNOSIS — L97519 Non-pressure chronic ulcer of other part of right foot with unspecified severity: Secondary | ICD-10-CM | POA: Diagnosis not present

## 2015-06-29 DIAGNOSIS — L03119 Cellulitis of unspecified part of limb: Secondary | ICD-10-CM

## 2015-06-29 MED ORDER — SULFAMETHOXAZOLE-TRIMETHOPRIM 800-160 MG PO TABS: 1.0000 | ORAL_TABLET | Freq: Two times a day (BID) | ORAL | Status: DC

## 2015-06-29 NOTE — Progress Notes (Signed)
Patient ID: Emily Miranda, female   DOB: 03/01/1941, 75 y.o.   MRN: [id number]  Subjective: Emily Miranda is a 75 y.o.  diabetic female patient returns to office for follow up eval of right foot ulceration; for possible graft application and left heel pain;Patient states that her heel pain on left is all better. Patient is changing the dressing using silvadene and bandaid at home. Admits that she has noticed more drainage and redness to the site since last week but did not call or come in sooner because she had appt scheduled for this week. Denies nausea/fever/vomiting/chills/night sweats/shortness of breath/pain.  Patient has no other pedal complaints at this time.  FBS 98  Patient Active Problem List   Diagnosis Date Noted  . Abdominal pain, RLQ (right lower quadrant) -chronic since 2009 06/02/2013  . Constipation, chronic 06/02/2013  . SUI (stress urinary incontinence, female) 06/02/2013  . Recurrent UTI (urinary tract infection) 06/02/2013  . HTN (hypertension)   . CHEST PAIN 11/27/2009  . OVERWEIGHT 11/28/2008  . ESSENTIAL HYPERTENSION, BENIGN 11/28/2008  . DM 11/25/2008  . AORTIC STENOSIS 11/25/2008  . PANCREATITIS, HX OF 11/25/2008   Current Outpatient Prescriptions on File Prior to Visit  Medication Sig Dispense Refill  . amLODipine (NORVASC) 5 MG tablet Take 5 mg by mouth daily.     . Ascorbic Acid (VITAMIN C) 100 MG tablet Take 100 mg by mouth daily.      . cephALEXin (KEFLEX) 500 MG capsule Take 1 capsule (500 mg total) by mouth 2 (two) times daily. 28 capsule 0  . chlorthalidone (HYGROTON) 25 MG tablet Take 0.5 tablets (12.5 mg total) by mouth daily. 15 tablet 11  . furosemide (LASIX) 20 MG tablet Take 20 mg by mouth daily.     Marland Kitchen glimepiride (AMARYL) 4 MG tablet Take 4 mg by mouth daily with breakfast.    . HYDROcodone-acetaminophen (NORCO) 5-325 MG per tablet Take 1 tablet by mouth every 6 (six) hours as needed. 10 tablet 0  . lisinopril (PRINIVIL,ZESTRIL) 10 MG  tablet Take 10 mg by mouth daily.    . methocarbamol (ROBAXIN) 500 MG tablet Take 1.5 tablets (750 mg total) by mouth every 8 (eight) hours as needed for muscle spasms (use for muscle cramps/pain). 30 tablet 2  . naproxen (NAPROSYN) 500 MG tablet Take 1 tablet (500 mg total) by mouth 2 (two) times daily with a meal. 40 tablet 1  . nitrofurantoin, macrocrystal-monohydrate, (MACROBID) 100 MG capsule Take 100 mg by mouth 2 (two) times daily.     . ONE TOUCH ULTRA TEST test strip 1 each by Other route once a week.     Marland Kitchen Phenyleph-Doxylamine-DM-APAP (ALKA SELTZER PLUS PO) Take 1 tablet by mouth daily as needed (congestion).    . pregabalin (LYRICA) 75 MG capsule Take 1 capsule (75 mg total) by mouth 2 (two) times daily. 60 capsule 1  . TRADJENTA 5 MG TABS tablet Take 5 mg by mouth daily.     . traMADol (ULTRAM) 50 MG tablet Take 50 mg by mouth 2 (two) times daily.      Current Facility-Administered Medications on File Prior to Visit  Medication Dose Route Frequency Provider Last Rate Last Dose  . triamcinolone acetonide (KENALOG) 10 MG/ML injection 10 mg  10 mg Other Once Landis Martins, DPM       Allergies  Allergen Reactions  . Levofloxacin Nausea And Vomiting    Other reaction(s): Confusion    Objective: General: Patient is awake, alert, oriented x 3  and in no acute distress.  Dermatology: Skin is warm and dry bilateral with a full thickness ulceration present Right proximal medial arch. Ulceration measures post debridement 3x2x1.5cm probes to soft tissue end range (last measurement 0.8 x 0.8 x 0.4) semicircular in nature with granular base. ++ erythema and surrounding cellulitis that does not ascend up the foot or leg with associated warmth and mild swelling. There is mild malodor, no active drainage. There are no other acute signs of infection.   Vascular: Dorsalis Pedis pulse = 2/4 Bilateral,  Posterior Tibial pulse = 1/4 Bilateral,  Capillary Fill Time < 5 seconds  Neurologic: Epicritic  sensation severely diminished to the level of ankle using the 5.07/10g [employer].  Musculosketal:  No pain with palpation to the ulceration site right foot. There is no pain with palpation to left heel. There is decreased ankle joint range of motion Bilateral. There is decreased Subtalar joint range of motion Bilateral. There is a decrease in 1st metatarsophalangeal joint range of motion Bilateral with significant Pes Planus and talonavicular buldge noted on the right>left foot, Residual chronic non-active Charcot foot . No pain with compression to calves bilateral.  Strength within normal limits bilateral.   X-ray, right and left foot:  Osseous mineralization decreased bilateral. There is no frank bony destruction or evidence of osteomyelitis at area of ulceration on right. There is no soft tissue emphysema noted an area of ulceration on Right. There is significant general arthritic changes and bony heel spur is chronic in nature and significant planus foot deformity with talonavicular bulge Right>Left. Old callused 2nd met fracture on right. There is hardware which is intact from previous foot surgeries on left.  No signs of acute Charcot or osteolysis.  Soft tissue swelling within normal limits.   Assessment and Plan:  Problem List Items Addressed This Visit    None    Visit Diagnoses    Foot pain, unspecified laterality    -  Primary    Relevant Orders    DG Foot 2 Views Left    DG Foot 2 Views Right    Diabetic ulcer of right foot associated with type 2 diabetes mellitus ([place])        Cellulitis and abscess of foot, except toes        Relevant Medications    sulfamethoxazole-trimethoprim (BACTRIM DS,SEPTRA DS) 800-160 MG tablet      -Examined patient and discussed the progression of the wound and treatment alternatives. - X-rays reviewed - Mechanically debrided Ulceration, right foot to healthy bleeding borders utilizing a sterile chisel blade and tissue  nipper -Applied to Right foot ulcer, Iodosorb and dry sterile dressing and instructed patient to continue with daily dressings at home consisting of the same.  -No wound graft was placed to site due to acute infection;  Will to hold off on this until infection is resolved.  -Prescribed Bactrim and advised patient that if warmth, redness, drainage or constitutional symptoms are present/worsen to immediately go to emergency room -Cont with post op shoe to assist with offloading the area on right foot -Cont with postop shoe to left foot to continue to alleviate pressure to left heel and to watch for signs of recurrence of bursitis -Advised patient to go to the ER or return to office if the wound worsens or if constitutional symptoms are present. -Patient to return to office in 1 week for follow up care and evaluation or sooner if problems arise.    Landis Martins, DPM

## 2015-07-03 DIAGNOSIS — Z79899 Other long term (current) drug therapy: Secondary | ICD-10-CM | POA: Diagnosis not present

## 2015-07-03 DIAGNOSIS — L03115 Cellulitis of right lower limb: Secondary | ICD-10-CM | POA: Diagnosis not present

## 2015-07-03 DIAGNOSIS — L02619 Cutaneous abscess of unspecified foot: Secondary | ICD-10-CM | POA: Diagnosis not present

## 2015-07-03 DIAGNOSIS — B372 Candidiasis of skin and nail: Secondary | ICD-10-CM | POA: Diagnosis not present

## 2015-07-03 DIAGNOSIS — I10 Essential (primary) hypertension: Secondary | ICD-10-CM | POA: Diagnosis not present

## 2015-07-03 DIAGNOSIS — N179 Acute kidney failure, unspecified: Secondary | ICD-10-CM | POA: Diagnosis not present

## 2015-07-03 DIAGNOSIS — M7989 Other specified soft tissue disorders: Secondary | ICD-10-CM | POA: Diagnosis not present

## 2015-07-03 DIAGNOSIS — B470 Eumycetoma: Secondary | ICD-10-CM | POA: Diagnosis not present

## 2015-07-03 DIAGNOSIS — N289 Disorder of kidney and ureter, unspecified: Secondary | ICD-10-CM | POA: Diagnosis not present

## 2015-07-03 DIAGNOSIS — D649 Anemia, unspecified: Secondary | ICD-10-CM | POA: Diagnosis not present

## 2015-07-03 DIAGNOSIS — L89891 Pressure ulcer of other site, stage 1: Secondary | ICD-10-CM | POA: Diagnosis not present

## 2015-07-03 DIAGNOSIS — E119 Type 2 diabetes mellitus without complications: Secondary | ICD-10-CM | POA: Diagnosis not present

## 2015-07-03 DIAGNOSIS — E1152 Type 2 diabetes mellitus with diabetic peripheral angiopathy with gangrene: Secondary | ICD-10-CM | POA: Diagnosis not present

## 2015-07-03 DIAGNOSIS — E11621 Type 2 diabetes mellitus with foot ulcer: Secondary | ICD-10-CM | POA: Diagnosis not present

## 2015-07-03 DIAGNOSIS — Z7984 Long term (current) use of oral hypoglycemic drugs: Secondary | ICD-10-CM | POA: Diagnosis not present

## 2015-07-03 DIAGNOSIS — L97409 Non-pressure chronic ulcer of unspecified heel and midfoot with unspecified severity: Secondary | ICD-10-CM | POA: Diagnosis not present

## 2015-07-03 DIAGNOSIS — N170 Acute kidney failure with tubular necrosis: Secondary | ICD-10-CM | POA: Diagnosis not present

## 2015-07-03 DIAGNOSIS — L97519 Non-pressure chronic ulcer of other part of right foot with unspecified severity: Secondary | ICD-10-CM | POA: Diagnosis not present

## 2015-07-03 DIAGNOSIS — Z7982 Long term (current) use of aspirin: Secondary | ICD-10-CM | POA: Diagnosis not present

## 2015-07-03 DIAGNOSIS — M79609 Pain in unspecified limb: Secondary | ICD-10-CM | POA: Diagnosis not present

## 2015-07-03 DIAGNOSIS — L02611 Cutaneous abscess of right foot: Secondary | ICD-10-CM | POA: Diagnosis not present

## 2015-07-05 NOTE — Telephone Encounter (Signed)
Is patient doing ok? Have her reschedule ASAP since we are treating an infected ulceration Thanks Dr. Marylene Land

## 2015-07-06 ENCOUNTER — Telehealth: Payer: Self-pay | Admitting: Sports Medicine

## 2015-07-06 ENCOUNTER — Ambulatory Visit: Payer: Medicare Other | Admitting: Sports Medicine

## 2015-07-06 NOTE — Telephone Encounter (Signed)
Sister informed me that patient is admitted to [facility] in room 376 for foot infection currently getting IV antibiotics. Advised sister that I will visit patient in hospital today and attempt to speak with caring physicians for the best plan of care.  -Dr. Marylene Land

## 2015-07-07 ENCOUNTER — Other Ambulatory Visit: Payer: Self-pay | Admitting: Sports Medicine

## 2015-07-07 DIAGNOSIS — L89891 Pressure ulcer of other site, stage 1: Secondary | ICD-10-CM | POA: Diagnosis not present

## 2015-07-07 DIAGNOSIS — B470 Eumycetoma: Secondary | ICD-10-CM

## 2015-07-07 DIAGNOSIS — L02619 Cutaneous abscess of unspecified foot: Secondary | ICD-10-CM | POA: Diagnosis not present

## 2015-07-07 DIAGNOSIS — E11621 Type 2 diabetes mellitus with foot ulcer: Secondary | ICD-10-CM | POA: Diagnosis not present

## 2015-07-10 ENCOUNTER — Other Ambulatory Visit: Payer: Self-pay | Admitting: Sports Medicine

## 2015-07-10 DIAGNOSIS — Z9889 Other specified postprocedural states: Secondary | ICD-10-CM

## 2015-07-11 ENCOUNTER — Telehealth: Payer: Self-pay | Admitting: *Deleted

## 2015-07-11 NOTE — Telephone Encounter (Addendum)
-----   Message from Asencion Islam, [place] sent at 07/10/2015  7:00 PM EST ----- Regarding: FYI Wound Vac I messaged Mary at [facility] and put in order for wound vac for patient who is to be discharged from [facility] on tomorrow to home. Wound vac nursing to start 07-12-15. Patient to follow up with me in office on next week 07-19-15; Informed patient to call once discharged to set up this appointment. Thanks Dr. Marylene Land.  07/11/2015-FAXED [facility] Wound Vac form and faxed with clinicals and pt demographics.  MARY OZIMEK - [facility] states that pt's insurance will not cover their wound care, but will cover the Wound Vac, suggest [facility].  I spoke with Pincus Sanes and she states she will call to establish if the [place] office can assist pt. Pincus Sanes states can not see pt to 07/17/2015.  Left message with [facility] [phone] to call to set up home health nursing.  MARY OZIMEK - [facility] states was able to get through to [facility] and pt is set up for Home Health Care 3x week for 12 weeks. 07/12/2015-[facility] [phone] request orders for the application of the [facility] Wound Vac to be faxed to [phone], they must contain the #of days per week the Wound vac is to be changed and the pressure whether continuous or intermittent and the amount of pressure and which of the 2 wounds the Wound vac is to cover and instructions for wound not covered by the wound vac and is pt to be totally non-weight-bearing.  [facility] does not use the same Wound Vac, would like to order [facility] wound vac.  Request instructions by IN BASKET from Dr. Marylene Land, and left voicemail msg.  07/13/2015-FAXED TO [facility] Dr. [name] orders for Wound Vac, wound care, PT/OT for gait and balance training, home safety and pt is to weight bear as  tolerated in surgical shoe.  Informed Shirlean Schlein - [facility] their wound vac would need to be picked up.  Informed Ileana Ladd Wound Vac [phone] of new order in the process, she states call her once faxed.  Left message informing Ileana Ladd order pack had been faxed to [phone].  07/13/2015-[facility] states pt would benefit from wet to dry dressings until the [facility] wound vac arrived. I okayed wet to dry dressings to the right foot ulcers.  I called [facility] [place] [phone], she states the Wound Vac order form was received Reference order# [id number] and would be processed.  07/17/2015-[facility] - [facility] states pt has a late afternoon appt with Dr. Marylene Land, and needed an order to leave the Wound Vac in place until after the appt, and to have a wet to dry dressing applied in the office to be replaced with the Wound Vac on Thursday 07/20/2015.  Aggie Cosier [facility] states the wound is looking very good, with very little drainage.  07/19/2015-LEFT MESSAGE FOR Aggie Cosier [phone] [facility] to re-apply Wound Vac on 07/21/2015, and continue the Friday, Monday, Wednesday schedule, and call me to confirm receipt of the orders.

## 2015-07-12 DIAGNOSIS — I10 Essential (primary) hypertension: Secondary | ICD-10-CM | POA: Diagnosis not present

## 2015-07-12 DIAGNOSIS — E11621 Type 2 diabetes mellitus with foot ulcer: Secondary | ICD-10-CM | POA: Diagnosis not present

## 2015-07-12 DIAGNOSIS — B379 Candidiasis, unspecified: Secondary | ICD-10-CM | POA: Diagnosis not present

## 2015-07-12 DIAGNOSIS — L03115 Cellulitis of right lower limb: Secondary | ICD-10-CM | POA: Diagnosis not present

## 2015-07-12 DIAGNOSIS — M1990 Unspecified osteoarthritis, unspecified site: Secondary | ICD-10-CM | POA: Diagnosis not present

## 2015-07-12 DIAGNOSIS — Z7984 Long term (current) use of oral hypoglycemic drugs: Secondary | ICD-10-CM | POA: Diagnosis not present

## 2015-07-12 DIAGNOSIS — Z7982 Long term (current) use of aspirin: Secondary | ICD-10-CM | POA: Diagnosis not present

## 2015-07-12 DIAGNOSIS — L97519 Non-pressure chronic ulcer of other part of right foot with unspecified severity: Secondary | ICD-10-CM | POA: Diagnosis not present

## 2015-07-12 DIAGNOSIS — D649 Anemia, unspecified: Secondary | ICD-10-CM | POA: Diagnosis not present

## 2015-07-12 DIAGNOSIS — E1159 Type 2 diabetes mellitus with other circulatory complications: Secondary | ICD-10-CM | POA: Diagnosis not present

## 2015-07-12 DIAGNOSIS — I350 Nonrheumatic aortic (valve) stenosis: Secondary | ICD-10-CM | POA: Diagnosis not present

## 2015-07-12 NOTE — Telephone Encounter (Signed)
Read notes re: vac. What's the question that you need me to give you instructions on? -Dr. Marylene Land

## 2015-07-12 NOTE — Telephone Encounter (Signed)
[facility] Wound Vac Orders Wound Vac Dressings Changes using granulofoam 3x per week, continuous setting at right foot ulcerations (plantar medial midfoot and medial 1st MTPJ). Patient may weightbear as tolerated with Post op shoe  Anticipate need of wound vac nursing for 3 months  Thanks Dr. Marylene Land

## 2015-07-13 DIAGNOSIS — E11621 Type 2 diabetes mellitus with foot ulcer: Secondary | ICD-10-CM | POA: Diagnosis not present

## 2015-07-13 DIAGNOSIS — Z7984 Long term (current) use of oral hypoglycemic drugs: Secondary | ICD-10-CM | POA: Diagnosis not present

## 2015-07-13 DIAGNOSIS — I350 Nonrheumatic aortic (valve) stenosis: Secondary | ICD-10-CM | POA: Diagnosis not present

## 2015-07-13 DIAGNOSIS — L97519 Non-pressure chronic ulcer of other part of right foot with unspecified severity: Secondary | ICD-10-CM | POA: Diagnosis not present

## 2015-07-13 DIAGNOSIS — D649 Anemia, unspecified: Secondary | ICD-10-CM | POA: Diagnosis not present

## 2015-07-13 DIAGNOSIS — E1159 Type 2 diabetes mellitus with other circulatory complications: Secondary | ICD-10-CM | POA: Diagnosis not present

## 2015-07-13 DIAGNOSIS — M1990 Unspecified osteoarthritis, unspecified site: Secondary | ICD-10-CM | POA: Diagnosis not present

## 2015-07-13 DIAGNOSIS — I10 Essential (primary) hypertension: Secondary | ICD-10-CM | POA: Diagnosis not present

## 2015-07-13 DIAGNOSIS — B379 Candidiasis, unspecified: Secondary | ICD-10-CM | POA: Diagnosis not present

## 2015-07-13 DIAGNOSIS — Z7982 Long term (current) use of aspirin: Secondary | ICD-10-CM | POA: Diagnosis not present

## 2015-07-13 DIAGNOSIS — L03115 Cellulitis of right lower limb: Secondary | ICD-10-CM | POA: Diagnosis not present

## 2015-07-14 DIAGNOSIS — D649 Anemia, unspecified: Secondary | ICD-10-CM | POA: Diagnosis not present

## 2015-07-14 DIAGNOSIS — M1990 Unspecified osteoarthritis, unspecified site: Secondary | ICD-10-CM | POA: Diagnosis not present

## 2015-07-14 DIAGNOSIS — I10 Essential (primary) hypertension: Secondary | ICD-10-CM | POA: Diagnosis not present

## 2015-07-14 DIAGNOSIS — Z7982 Long term (current) use of aspirin: Secondary | ICD-10-CM | POA: Diagnosis not present

## 2015-07-14 DIAGNOSIS — L03115 Cellulitis of right lower limb: Secondary | ICD-10-CM | POA: Diagnosis not present

## 2015-07-14 DIAGNOSIS — E11621 Type 2 diabetes mellitus with foot ulcer: Secondary | ICD-10-CM | POA: Diagnosis not present

## 2015-07-14 DIAGNOSIS — L97519 Non-pressure chronic ulcer of other part of right foot with unspecified severity: Secondary | ICD-10-CM | POA: Diagnosis not present

## 2015-07-14 DIAGNOSIS — E1159 Type 2 diabetes mellitus with other circulatory complications: Secondary | ICD-10-CM | POA: Diagnosis not present

## 2015-07-14 DIAGNOSIS — Z7984 Long term (current) use of oral hypoglycemic drugs: Secondary | ICD-10-CM | POA: Diagnosis not present

## 2015-07-14 DIAGNOSIS — L02619 Cutaneous abscess of unspecified foot: Secondary | ICD-10-CM | POA: Diagnosis not present

## 2015-07-14 DIAGNOSIS — L03119 Cellulitis of unspecified part of limb: Secondary | ICD-10-CM | POA: Diagnosis not present

## 2015-07-14 DIAGNOSIS — I350 Nonrheumatic aortic (valve) stenosis: Secondary | ICD-10-CM | POA: Diagnosis not present

## 2015-07-14 DIAGNOSIS — B379 Candidiasis, unspecified: Secondary | ICD-10-CM | POA: Diagnosis not present

## 2015-07-17 DIAGNOSIS — L03115 Cellulitis of right lower limb: Secondary | ICD-10-CM | POA: Diagnosis not present

## 2015-07-17 DIAGNOSIS — M1990 Unspecified osteoarthritis, unspecified site: Secondary | ICD-10-CM | POA: Diagnosis not present

## 2015-07-17 DIAGNOSIS — I350 Nonrheumatic aortic (valve) stenosis: Secondary | ICD-10-CM | POA: Diagnosis not present

## 2015-07-17 DIAGNOSIS — E1159 Type 2 diabetes mellitus with other circulatory complications: Secondary | ICD-10-CM | POA: Diagnosis not present

## 2015-07-17 DIAGNOSIS — I10 Essential (primary) hypertension: Secondary | ICD-10-CM | POA: Diagnosis not present

## 2015-07-17 DIAGNOSIS — Z7984 Long term (current) use of oral hypoglycemic drugs: Secondary | ICD-10-CM | POA: Diagnosis not present

## 2015-07-17 DIAGNOSIS — E11621 Type 2 diabetes mellitus with foot ulcer: Secondary | ICD-10-CM | POA: Diagnosis not present

## 2015-07-17 DIAGNOSIS — B379 Candidiasis, unspecified: Secondary | ICD-10-CM | POA: Diagnosis not present

## 2015-07-17 DIAGNOSIS — L97519 Non-pressure chronic ulcer of other part of right foot with unspecified severity: Secondary | ICD-10-CM | POA: Diagnosis not present

## 2015-07-17 DIAGNOSIS — D649 Anemia, unspecified: Secondary | ICD-10-CM | POA: Diagnosis not present

## 2015-07-17 DIAGNOSIS — Z7982 Long term (current) use of aspirin: Secondary | ICD-10-CM | POA: Diagnosis not present

## 2015-07-17 NOTE — Telephone Encounter (Signed)
Order is acceptable Wound Vac in place until after the appt, and to have a wet to dry dressing applied in the office to be replaced with the Wound Vac on Thursday 07/20/2015

## 2015-07-18 DIAGNOSIS — L03119 Cellulitis of unspecified part of limb: Secondary | ICD-10-CM | POA: Diagnosis not present

## 2015-07-18 DIAGNOSIS — L02619 Cutaneous abscess of unspecified foot: Secondary | ICD-10-CM | POA: Diagnosis not present

## 2015-07-18 DIAGNOSIS — E11621 Type 2 diabetes mellitus with foot ulcer: Secondary | ICD-10-CM | POA: Diagnosis not present

## 2015-07-19 ENCOUNTER — Ambulatory Visit: Payer: Medicare Other

## 2015-07-19 ENCOUNTER — Ambulatory Visit (INDEPENDENT_AMBULATORY_CARE_PROVIDER_SITE_OTHER): Payer: Medicare Other | Admitting: Sports Medicine

## 2015-07-19 ENCOUNTER — Encounter: Payer: Self-pay | Admitting: Sports Medicine

## 2015-07-19 DIAGNOSIS — M2141 Flat foot [pes planus] (acquired), right foot: Secondary | ICD-10-CM | POA: Diagnosis not present

## 2015-07-19 DIAGNOSIS — M2142 Flat foot [pes planus] (acquired), left foot: Secondary | ICD-10-CM

## 2015-07-19 DIAGNOSIS — M79671 Pain in right foot: Secondary | ICD-10-CM | POA: Diagnosis not present

## 2015-07-19 DIAGNOSIS — L97512 Non-pressure chronic ulcer of other part of right foot with fat layer exposed: Secondary | ICD-10-CM | POA: Diagnosis not present

## 2015-07-19 DIAGNOSIS — E1142 Type 2 diabetes mellitus with diabetic polyneuropathy: Secondary | ICD-10-CM | POA: Diagnosis not present

## 2015-07-19 NOTE — Telephone Encounter (Signed)
-----   Message from Asencion Islam, [place] sent at 07/19/2015  3:45 PM EST ----- Regarding: Wound Vac ReApplication  Can you send a order to home nursing for wound vac for patient to re-apply on Friday morning. A wet to dry dressing was applied today and I'm fine with resuming on normal dressing change schedule of Friday, Monday, Wednesday  Thanks Dr. Marylene Land

## 2015-07-19 NOTE — Telephone Encounter (Signed)
Entered in error

## 2015-07-19 NOTE — Progress Notes (Signed)
Patient ID: Emily Miranda, female   DOB: 1941-01-04, 75 y.o.   MRN: [id number] Subjective: Emily Miranda is a 75 y.o. female patient seen in office S/p I&D with Debridement performed on 07-07-15 during hospital stay for evaluation of ulcerations of the right foot. Patient has a history of diabetes and a blood glucose level today of 130 mg/dl.   Patient has completed Diflucan with much improvement in infection in foot. Patient is currently on Wound Vac Therapy with nursing M/W/F. Denies nausea/fever/vomiting/chills/night sweats/shortness of breath/pain. Patient has no other pedal complaints at this time.  Patient Active Problem List   Diagnosis Date Noted  . Abdominal pain, RLQ (right lower quadrant) -chronic since 2009 06/02/2013  . Constipation, chronic 06/02/2013  . SUI (stress urinary incontinence, female) 06/02/2013  . Recurrent UTI (urinary tract infection) 06/02/2013  . HTN (hypertension)   . CHEST PAIN 11/27/2009  . OVERWEIGHT 11/28/2008  . ESSENTIAL HYPERTENSION, BENIGN 11/28/2008  . DM 11/25/2008  . AORTIC STENOSIS 11/25/2008  . PANCREATITIS, HX OF 11/25/2008   Current Outpatient Prescriptions on File Prior to Visit  Medication Sig Dispense Refill  . amLODipine (NORVASC) 5 MG tablet Take 5 mg by mouth daily.     . Ascorbic Acid (VITAMIN C) 100 MG tablet Take 100 mg by mouth daily.      . cephALEXin (KEFLEX) 500 MG capsule Take 1 capsule (500 mg total) by mouth 2 (two) times daily. 28 capsule 0  . chlorthalidone (HYGROTON) 25 MG tablet Take 0.5 tablets (12.5 mg total) by mouth daily. 15 tablet 11  . furosemide (LASIX) 20 MG tablet Take 20 mg by mouth daily.     Marland Kitchen glimepiride (AMARYL) 4 MG tablet Take 4 mg by mouth daily with breakfast.    . HYDROcodone-acetaminophen (NORCO) 5-325 MG per tablet Take 1 tablet by mouth every 6 (six) hours as needed. 10 tablet 0  . lisinopril (PRINIVIL,ZESTRIL) 10 MG tablet Take 10 mg by mouth daily.    . methocarbamol (ROBAXIN) 500 MG tablet  Take 1.5 tablets (750 mg total) by mouth every 8 (eight) hours as needed for muscle spasms (use for muscle cramps/pain). 30 tablet 2  . naproxen (NAPROSYN) 500 MG tablet Take 1 tablet (500 mg total) by mouth 2 (two) times daily with a meal. 40 tablet 1  . nitrofurantoin, macrocrystal-monohydrate, (MACROBID) 100 MG capsule Take 100 mg by mouth 2 (two) times daily.     . ONE TOUCH ULTRA TEST test strip 1 each by Other route once a week.     Marland Kitchen Phenyleph-Doxylamine-DM-APAP (ALKA SELTZER PLUS PO) Take 1 tablet by mouth daily as needed (congestion).    . pregabalin (LYRICA) 75 MG capsule Take 1 capsule (75 mg total) by mouth 2 (two) times daily. 60 capsule 1  . sulfamethoxazole-trimethoprim (BACTRIM DS,SEPTRA DS) 800-160 MG tablet Take 1 tablet by mouth 2 (two) times daily. 20 tablet 0  . TRADJENTA 5 MG TABS tablet Take 5 mg by mouth daily.     . traMADol (ULTRAM) 50 MG tablet Take 50 mg by mouth 2 (two) times daily.      Current Facility-Administered Medications on File Prior to Visit  Medication Dose Route Frequency Provider Last Rate Last Dose  . triamcinolone acetonide (KENALOG) 10 MG/ML injection 10 mg  10 mg Other Once Asencion Islam, DPM       Allergies  Allergen Reactions  . Levofloxacin Nausea And Vomiting    Other reaction(s): Confusion    No results found for this or any  previous visit (from the past 2160 hour(s)).  Objective: There were no vitals filed for this visit.  General: Patient is awake, alert, oriented x 3 and in no acute distress.  Dermatology: Skin is warm and dry bilateral with a full thickness ulceration present Right  plantar medial midfoot. Ulceration measures  3.5 cm x  2.5 cm x  1 cm. There is a   macerated border with a  granular base. The ulceration does not probe to bone. There is no malodor, no active drainage, no erythema, no edema. No acute signs of infection.   there is also a full thickness ulceration medial aspect of the right first metatarsophalangeal  joint the ulceration measures 1.5 cm x 1 cm x 0.5 cm.  There is a mildly macerated border with a granular base the ulceration does not probe to bone there is no malodor no active drainage , no erythema , no edema no other acute signs of infection present   Vascular: Dorsalis Pedis pulse = 2/4 Bilateral,  Posterior Tibial pulse = 1/4 Bilateral,  Capillary Fill Time < 5 seconds  Neurologic: Epicritic sensation diminished  to the level of ankle using  the 5.07/10g [employer].  Musculosketal:  There is no pain with palpation 2. Ulceration sites on right foot pes planus foot type with significant talonavicular bulge, right greater than left foot with residual chronic none active Charcot. . No pain with compression to calves bilateral.  Strength acceptable for patient status.   Xrays, Right foot: no acute changes from prior, no active Charcot or osteomyelitis present there is no soft tissue emphysema or concerns for gas gangrene.   Assessment and Plan:  Problem List Items Addressed This Visit    None    Visit Diagnoses    Right foot pain    -  Primary    Relevant Orders    DG Foot 2 Views Right    Right foot ulcer, with fat layer exposed (HCC)        Diabetic polyneuropathy associated with type 2 diabetes mellitus (HCC)        Pes planus of both feet          -Examined patient and discussed the progression of the wound and treatment alternatives. -Xrays reviewed - Cleansed ulceration sites and removed friable Periwound skin. Ulceration beds seem healthy in appearance and does not need any type of excisional debridement at this time -Applied damp to dry and dry sterile dressing secured with Coban. Patient to continue with home nursing for wound VAC changes with the next change being on Friday morning. -Continue with limited weightbearing for transfers only with post op shoe. No excessive walking or standing.  -Advised patient to go to the ER or return to office if the wound  worsens or if constitutional symptoms are present. -Patient to return to office in 2 weeks for follow up care and evaluation or sooner if problems arise.  Asencion Islam, DPM

## 2015-07-21 DIAGNOSIS — M1990 Unspecified osteoarthritis, unspecified site: Secondary | ICD-10-CM | POA: Diagnosis not present

## 2015-07-21 DIAGNOSIS — I350 Nonrheumatic aortic (valve) stenosis: Secondary | ICD-10-CM | POA: Diagnosis not present

## 2015-07-21 DIAGNOSIS — D649 Anemia, unspecified: Secondary | ICD-10-CM | POA: Diagnosis not present

## 2015-07-21 DIAGNOSIS — Z7984 Long term (current) use of oral hypoglycemic drugs: Secondary | ICD-10-CM | POA: Diagnosis not present

## 2015-07-21 DIAGNOSIS — L03115 Cellulitis of right lower limb: Secondary | ICD-10-CM | POA: Diagnosis not present

## 2015-07-21 DIAGNOSIS — E11621 Type 2 diabetes mellitus with foot ulcer: Secondary | ICD-10-CM | POA: Diagnosis not present

## 2015-07-21 DIAGNOSIS — L97519 Non-pressure chronic ulcer of other part of right foot with unspecified severity: Secondary | ICD-10-CM | POA: Diagnosis not present

## 2015-07-21 DIAGNOSIS — I10 Essential (primary) hypertension: Secondary | ICD-10-CM | POA: Diagnosis not present

## 2015-07-21 DIAGNOSIS — B379 Candidiasis, unspecified: Secondary | ICD-10-CM | POA: Diagnosis not present

## 2015-07-21 DIAGNOSIS — Z7982 Long term (current) use of aspirin: Secondary | ICD-10-CM | POA: Diagnosis not present

## 2015-07-21 DIAGNOSIS — E1159 Type 2 diabetes mellitus with other circulatory complications: Secondary | ICD-10-CM | POA: Diagnosis not present

## 2015-07-24 DIAGNOSIS — I10 Essential (primary) hypertension: Secondary | ICD-10-CM | POA: Diagnosis not present

## 2015-07-24 DIAGNOSIS — Z7984 Long term (current) use of oral hypoglycemic drugs: Secondary | ICD-10-CM | POA: Diagnosis not present

## 2015-07-24 DIAGNOSIS — D649 Anemia, unspecified: Secondary | ICD-10-CM | POA: Diagnosis not present

## 2015-07-24 DIAGNOSIS — Z7982 Long term (current) use of aspirin: Secondary | ICD-10-CM | POA: Diagnosis not present

## 2015-07-24 DIAGNOSIS — B379 Candidiasis, unspecified: Secondary | ICD-10-CM | POA: Diagnosis not present

## 2015-07-24 DIAGNOSIS — I350 Nonrheumatic aortic (valve) stenosis: Secondary | ICD-10-CM | POA: Diagnosis not present

## 2015-07-24 DIAGNOSIS — M1990 Unspecified osteoarthritis, unspecified site: Secondary | ICD-10-CM | POA: Diagnosis not present

## 2015-07-24 DIAGNOSIS — L97519 Non-pressure chronic ulcer of other part of right foot with unspecified severity: Secondary | ICD-10-CM | POA: Diagnosis not present

## 2015-07-24 DIAGNOSIS — E11621 Type 2 diabetes mellitus with foot ulcer: Secondary | ICD-10-CM | POA: Diagnosis not present

## 2015-07-24 DIAGNOSIS — L03115 Cellulitis of right lower limb: Secondary | ICD-10-CM | POA: Diagnosis not present

## 2015-07-24 DIAGNOSIS — E1159 Type 2 diabetes mellitus with other circulatory complications: Secondary | ICD-10-CM | POA: Diagnosis not present

## 2015-07-26 DIAGNOSIS — E1159 Type 2 diabetes mellitus with other circulatory complications: Secondary | ICD-10-CM | POA: Diagnosis not present

## 2015-07-26 DIAGNOSIS — B379 Candidiasis, unspecified: Secondary | ICD-10-CM | POA: Diagnosis not present

## 2015-07-26 DIAGNOSIS — M1990 Unspecified osteoarthritis, unspecified site: Secondary | ICD-10-CM | POA: Diagnosis not present

## 2015-07-26 DIAGNOSIS — I350 Nonrheumatic aortic (valve) stenosis: Secondary | ICD-10-CM | POA: Diagnosis not present

## 2015-07-26 DIAGNOSIS — L97519 Non-pressure chronic ulcer of other part of right foot with unspecified severity: Secondary | ICD-10-CM | POA: Diagnosis not present

## 2015-07-26 DIAGNOSIS — D649 Anemia, unspecified: Secondary | ICD-10-CM | POA: Diagnosis not present

## 2015-07-26 DIAGNOSIS — L03115 Cellulitis of right lower limb: Secondary | ICD-10-CM | POA: Diagnosis not present

## 2015-07-26 DIAGNOSIS — Z7984 Long term (current) use of oral hypoglycemic drugs: Secondary | ICD-10-CM | POA: Diagnosis not present

## 2015-07-26 DIAGNOSIS — E11621 Type 2 diabetes mellitus with foot ulcer: Secondary | ICD-10-CM | POA: Diagnosis not present

## 2015-07-26 DIAGNOSIS — Z7982 Long term (current) use of aspirin: Secondary | ICD-10-CM | POA: Diagnosis not present

## 2015-07-26 DIAGNOSIS — I10 Essential (primary) hypertension: Secondary | ICD-10-CM | POA: Diagnosis not present

## 2015-07-28 DIAGNOSIS — Z7984 Long term (current) use of oral hypoglycemic drugs: Secondary | ICD-10-CM | POA: Diagnosis not present

## 2015-07-28 DIAGNOSIS — Z7982 Long term (current) use of aspirin: Secondary | ICD-10-CM | POA: Diagnosis not present

## 2015-07-28 DIAGNOSIS — I350 Nonrheumatic aortic (valve) stenosis: Secondary | ICD-10-CM | POA: Diagnosis not present

## 2015-07-28 DIAGNOSIS — L97519 Non-pressure chronic ulcer of other part of right foot with unspecified severity: Secondary | ICD-10-CM | POA: Diagnosis not present

## 2015-07-28 DIAGNOSIS — E11621 Type 2 diabetes mellitus with foot ulcer: Secondary | ICD-10-CM | POA: Diagnosis not present

## 2015-07-28 DIAGNOSIS — L03115 Cellulitis of right lower limb: Secondary | ICD-10-CM | POA: Diagnosis not present

## 2015-07-28 DIAGNOSIS — I10 Essential (primary) hypertension: Secondary | ICD-10-CM | POA: Diagnosis not present

## 2015-07-28 DIAGNOSIS — M1990 Unspecified osteoarthritis, unspecified site: Secondary | ICD-10-CM | POA: Diagnosis not present

## 2015-07-28 DIAGNOSIS — D649 Anemia, unspecified: Secondary | ICD-10-CM | POA: Diagnosis not present

## 2015-07-28 DIAGNOSIS — B379 Candidiasis, unspecified: Secondary | ICD-10-CM | POA: Diagnosis not present

## 2015-07-28 DIAGNOSIS — E1159 Type 2 diabetes mellitus with other circulatory complications: Secondary | ICD-10-CM | POA: Diagnosis not present

## 2015-07-31 DIAGNOSIS — D649 Anemia, unspecified: Secondary | ICD-10-CM | POA: Diagnosis not present

## 2015-07-31 DIAGNOSIS — L97519 Non-pressure chronic ulcer of other part of right foot with unspecified severity: Secondary | ICD-10-CM | POA: Diagnosis not present

## 2015-07-31 DIAGNOSIS — Z7984 Long term (current) use of oral hypoglycemic drugs: Secondary | ICD-10-CM | POA: Diagnosis not present

## 2015-07-31 DIAGNOSIS — L03115 Cellulitis of right lower limb: Secondary | ICD-10-CM | POA: Diagnosis not present

## 2015-07-31 DIAGNOSIS — Z7982 Long term (current) use of aspirin: Secondary | ICD-10-CM | POA: Diagnosis not present

## 2015-07-31 DIAGNOSIS — I10 Essential (primary) hypertension: Secondary | ICD-10-CM | POA: Diagnosis not present

## 2015-07-31 DIAGNOSIS — E11621 Type 2 diabetes mellitus with foot ulcer: Secondary | ICD-10-CM | POA: Diagnosis not present

## 2015-07-31 DIAGNOSIS — B379 Candidiasis, unspecified: Secondary | ICD-10-CM | POA: Diagnosis not present

## 2015-07-31 DIAGNOSIS — I350 Nonrheumatic aortic (valve) stenosis: Secondary | ICD-10-CM | POA: Diagnosis not present

## 2015-07-31 DIAGNOSIS — M1990 Unspecified osteoarthritis, unspecified site: Secondary | ICD-10-CM | POA: Diagnosis not present

## 2015-07-31 DIAGNOSIS — E1159 Type 2 diabetes mellitus with other circulatory complications: Secondary | ICD-10-CM | POA: Diagnosis not present

## 2015-08-02 DIAGNOSIS — E11621 Type 2 diabetes mellitus with foot ulcer: Secondary | ICD-10-CM | POA: Diagnosis not present

## 2015-08-02 DIAGNOSIS — Z7984 Long term (current) use of oral hypoglycemic drugs: Secondary | ICD-10-CM | POA: Diagnosis not present

## 2015-08-02 DIAGNOSIS — E1159 Type 2 diabetes mellitus with other circulatory complications: Secondary | ICD-10-CM | POA: Diagnosis not present

## 2015-08-02 DIAGNOSIS — I10 Essential (primary) hypertension: Secondary | ICD-10-CM | POA: Diagnosis not present

## 2015-08-02 DIAGNOSIS — M1990 Unspecified osteoarthritis, unspecified site: Secondary | ICD-10-CM | POA: Diagnosis not present

## 2015-08-02 DIAGNOSIS — D649 Anemia, unspecified: Secondary | ICD-10-CM | POA: Diagnosis not present

## 2015-08-02 DIAGNOSIS — B379 Candidiasis, unspecified: Secondary | ICD-10-CM | POA: Diagnosis not present

## 2015-08-02 DIAGNOSIS — L97519 Non-pressure chronic ulcer of other part of right foot with unspecified severity: Secondary | ICD-10-CM | POA: Diagnosis not present

## 2015-08-02 DIAGNOSIS — L03115 Cellulitis of right lower limb: Secondary | ICD-10-CM | POA: Diagnosis not present

## 2015-08-02 DIAGNOSIS — Z7982 Long term (current) use of aspirin: Secondary | ICD-10-CM | POA: Diagnosis not present

## 2015-08-02 DIAGNOSIS — I350 Nonrheumatic aortic (valve) stenosis: Secondary | ICD-10-CM | POA: Diagnosis not present

## 2015-08-03 ENCOUNTER — Ambulatory Visit (INDEPENDENT_AMBULATORY_CARE_PROVIDER_SITE_OTHER): Payer: Medicare Other | Admitting: Sports Medicine

## 2015-08-03 ENCOUNTER — Encounter: Payer: Self-pay | Admitting: Sports Medicine

## 2015-08-03 DIAGNOSIS — M79671 Pain in right foot: Secondary | ICD-10-CM

## 2015-08-03 DIAGNOSIS — M2142 Flat foot [pes planus] (acquired), left foot: Secondary | ICD-10-CM

## 2015-08-03 DIAGNOSIS — M2141 Flat foot [pes planus] (acquired), right foot: Secondary | ICD-10-CM | POA: Diagnosis not present

## 2015-08-03 DIAGNOSIS — E1142 Type 2 diabetes mellitus with diabetic polyneuropathy: Secondary | ICD-10-CM

## 2015-08-03 DIAGNOSIS — L97512 Non-pressure chronic ulcer of other part of right foot with fat layer exposed: Secondary | ICD-10-CM | POA: Diagnosis not present

## 2015-08-03 NOTE — Progress Notes (Signed)
Patient ID: GORGEOUS NEWLUN, female   DOB: Jul 15, 1940, 75 y.o.   MRN: [id number]  Subjective: Emily Miranda is a 75 y.o. female patient seen in office S/p I&D with debridement performed on 07-07-15 during hospital stay for ulcerations of the right foot with infection. Patient has a history of diabetes and a blood glucose level of 120-130 mg/dl.  Patient has completed Diflucan with much improvement in infection in foot. Patient is currently on Wound Vac Therapy with nursing M/W/F. Denies nausea/fever/vomiting/chills/night sweats/shortness of breath/pain. Admits to increased smell and more drainage in the canister. Patient has no other pedal complaints at this time.  Patient Active Problem List   Diagnosis Date Noted  . Abdominal pain, RLQ (right lower quadrant) -chronic since 2009 06/02/2013  . Constipation, chronic 06/02/2013  . SUI (stress urinary incontinence, female) 06/02/2013  . Recurrent UTI (urinary tract infection) 06/02/2013  . HTN (hypertension)   . CHEST PAIN 11/27/2009  . OVERWEIGHT 11/28/2008  . ESSENTIAL HYPERTENSION, BENIGN 11/28/2008  . DM 11/25/2008  . AORTIC STENOSIS 11/25/2008  . PANCREATITIS, HX OF 11/25/2008   Current Outpatient Prescriptions on File Prior to Visit  Medication Sig Dispense Refill  . amLODipine (NORVASC) 5 MG tablet Take 5 mg by mouth daily.     . Ascorbic Acid (VITAMIN C) 100 MG tablet Take 100 mg by mouth daily.      . cephALEXin (KEFLEX) 500 MG capsule Take 1 capsule (500 mg total) by mouth 2 (two) times daily. 28 capsule 0  . chlorthalidone (HYGROTON) 25 MG tablet Take 0.5 tablets (12.5 mg total) by mouth daily. 15 tablet 11  . furosemide (LASIX) 20 MG tablet Take 20 mg by mouth daily.     Marland Kitchen glimepiride (AMARYL) 4 MG tablet Take 4 mg by mouth daily with breakfast.    . HYDROcodone-acetaminophen (NORCO) 5-325 MG per tablet Take 1 tablet by mouth every 6 (six) hours as needed. 10 tablet 0  . lisinopril (PRINIVIL,ZESTRIL) 10 MG tablet Take 10 mg by  mouth daily.    . methocarbamol (ROBAXIN) 500 MG tablet Take 1.5 tablets (750 mg total) by mouth every 8 (eight) hours as needed for muscle spasms (use for muscle cramps/pain). 30 tablet 2  . naproxen (NAPROSYN) 500 MG tablet Take 1 tablet (500 mg total) by mouth 2 (two) times daily with a meal. 40 tablet 1  . nitrofurantoin, macrocrystal-monohydrate, (MACROBID) 100 MG capsule Take 100 mg by mouth 2 (two) times daily.     . ONE TOUCH ULTRA TEST test strip 1 each by Other route once a week.     Marland Kitchen Phenyleph-Doxylamine-DM-APAP (ALKA SELTZER PLUS PO) Take 1 tablet by mouth daily as needed (congestion).    . pregabalin (LYRICA) 75 MG capsule Take 1 capsule (75 mg total) by mouth 2 (two) times daily. 60 capsule 1  . sulfamethoxazole-trimethoprim (BACTRIM DS,SEPTRA DS) 800-160 MG tablet Take 1 tablet by mouth 2 (two) times daily. 20 tablet 0  . TRADJENTA 5 MG TABS tablet Take 5 mg by mouth daily.     . traMADol (ULTRAM) 50 MG tablet Take 50 mg by mouth 2 (two) times daily.      Current Facility-Administered Medications on File Prior to Visit  Medication Dose Route Frequency Provider Last Rate Last Dose  . triamcinolone acetonide (KENALOG) 10 MG/ML injection 10 mg  10 mg Other Once Asencion Islam, DPM       Allergies  Allergen Reactions  . Levofloxacin Nausea And Vomiting    Other reaction(s): Confusion  No results found for this or any previous visit (from the past 2160 hour(s)).  Objective: There were no vitals filed for this visit.  General: Patient is awake, alert, oriented x 3 and in no acute distress.  Dermatology: Skin is warm and dry bilateral with a full thickness ulceration present Right  plantar medial midfoot. Ulceration measures  3.5cm x 3cmx 0.8cm (last measurement 3.5 cm x  2.5 cm x  1 cm). There is a  macerated border with a  Hyper-granular base. The ulceration does not probe to bone. No active drainage, no erythema, no edema. No acute signs of infection.   There is also a  full thickness ulceration medial aspect of the right first metatarsophalangeal joint the ulceration measures 1.5 cm x 1 cm x 0.3cm (last measurement 1.5 cm x 1 cm x 0.5 cm).  There is a mildly macerated border with a granular base the ulceration does not probe to bone there is no active drainage , no erythema , no edema no other acute signs of infection present  There is mild malodor that is generalized to the foot that is improved with wound cleanse.    Vascular: Dorsalis Pedis pulse = 2/4 Bilateral,  Posterior Tibial pulse = 1/4 Bilateral,  Capillary Fill Time < 5 seconds  Neurologic: Epicritic sensation diminished  to the level of ankle using the 5.07/10g [employer].  Musculosketal:  There is no pain with palpation to the ulceration sites on right foot, pes planus foot type with significant talonavicular bulge, right greater than left foot with residual chronic none active Charcot. No pain with compression to calves bilateral.  Strength acceptable for patient status.   Assessment and Plan:  Problem List Items Addressed This Visit    None    Visit Diagnoses    Right foot pain    -  Primary    Right foot ulcer, with fat layer exposed (HCC)        Diabetic polyneuropathy associated with type 2 diabetes mellitus (HCC)        Pes planus of both feet          -Examined patient and discussed the progression of the wound and treatment alternatives. - Cleansed ulceration sites and entire right foot with wound cleanse.  - Excisionally debrided ulceration sites using a tissue nipper to healthy bleeding specifically at the right plantar medial midfoot ulceration cauterized hyper granular tissue utilizing silver nitrate. Due to the hyper-granular nature of this wound, at this time, I will not place a wound graft at this site. However, at the right first metatarsal phalangeal joint. Ulceration placed Osiris grafix prime 5 x 5 cm, lot number [id number] unit #[id number], expiration 12/15/2016,  part number [id number] Used in its entirety at the right first metatarsophalangeal joint. This is the first graft to the site. The graft was secured in place utilizing Mepitel secured with Steri-Strips. All areas were then dressed with 4 x 4's in a bolster dressing fashion and Coban. Patient tolerated the application of the graft well with no immediate complications.  -Updated home wound nursing orders sent with instructions to discontinue wound VAC for now; will reassess in 1 week for possible future need. Home nursing to change outer dressings on Monday, Wednesday, Friday by placing a 4 x 4 dry gauze at the graft site at the right first metatarsophalangeal joint, being careful not to remove any of the Steri-Strips because graft is below that layer also apply Acticoat or absorbent dressing at  plantar medial midfoot ulceration secured with 4 x 4, ABDs, Kerlix and Ace.  -Continue with limited weightbearing for transfers only with post op shoe. No excessive walking or standing.  -Advised patient to go to the ER or return to office if the wound worsens or if constitutional symptoms are present. -Patient to return to office in 1 weeks for follow up care/possible graft re-application and evaluation or sooner if problems arise.  Asencion Islam, DPM

## 2015-08-04 DIAGNOSIS — D649 Anemia, unspecified: Secondary | ICD-10-CM | POA: Diagnosis not present

## 2015-08-04 DIAGNOSIS — E11621 Type 2 diabetes mellitus with foot ulcer: Secondary | ICD-10-CM | POA: Diagnosis not present

## 2015-08-04 DIAGNOSIS — B379 Candidiasis, unspecified: Secondary | ICD-10-CM | POA: Diagnosis not present

## 2015-08-04 DIAGNOSIS — Z7982 Long term (current) use of aspirin: Secondary | ICD-10-CM | POA: Diagnosis not present

## 2015-08-04 DIAGNOSIS — M1990 Unspecified osteoarthritis, unspecified site: Secondary | ICD-10-CM | POA: Diagnosis not present

## 2015-08-04 DIAGNOSIS — L03115 Cellulitis of right lower limb: Secondary | ICD-10-CM | POA: Diagnosis not present

## 2015-08-04 DIAGNOSIS — E1159 Type 2 diabetes mellitus with other circulatory complications: Secondary | ICD-10-CM | POA: Diagnosis not present

## 2015-08-04 DIAGNOSIS — L97519 Non-pressure chronic ulcer of other part of right foot with unspecified severity: Secondary | ICD-10-CM | POA: Diagnosis not present

## 2015-08-04 DIAGNOSIS — I10 Essential (primary) hypertension: Secondary | ICD-10-CM | POA: Diagnosis not present

## 2015-08-04 DIAGNOSIS — I350 Nonrheumatic aortic (valve) stenosis: Secondary | ICD-10-CM | POA: Diagnosis not present

## 2015-08-04 DIAGNOSIS — Z7984 Long term (current) use of oral hypoglycemic drugs: Secondary | ICD-10-CM | POA: Diagnosis not present

## 2015-08-04 NOTE — Telephone Encounter (Deleted)
-----   Message from Asencion Islam, [place] sent at 08/03/2015  5:50 PM EDT ----- Regarding: Home Nursing Wound Care Orders Hi Stela Iwasaki,   Can you send updated nursing orders to [facility] home wound care nursing agency  Orders Discontinue wound vac for now will re-assess in 1 week for possible future need Change outer dressings M/W/F at right foot by placing 4x4 dry guaze at graft site at right 1st MTPJ; DO NOT remove steristrips; patient has graft on under that layer. Apply acticoat or an absorbant dressing at the plantar medial midfoot ulceration secured with 4x4, ABD, kerlix and ace at right foot.   -Dr. Marylene Land

## 2015-08-04 NOTE — Telephone Encounter (Addendum)
-----   Message from Asencion Islam, [place] sent at 08/03/2015  5:50 PM EDT ----- Regarding: Home Nursing Wound Care Orders Hi Valery,   Can you send updated nursing orders to [facility] home wound care nursing agency  Orders Discontinue wound vac for now will re-assess in 1 week for possible future need Change outer dressings M/W/F at right foot by placing 4x4 dry guaze at graft site at right 1st MTPJ; DO NOT remove steristrips; patient has graft on under that layer. Apply acticoat or an absorbant dressing at the plantar medial midfoot ulceration secured with 4x4, ABD, kerlix and ace at right foot.   -Dr. Marylene Land.  08/04/2015-Faxed copy of Dr. [name] orders to [facility].

## 2015-08-07 DIAGNOSIS — D649 Anemia, unspecified: Secondary | ICD-10-CM | POA: Diagnosis not present

## 2015-08-07 DIAGNOSIS — I10 Essential (primary) hypertension: Secondary | ICD-10-CM | POA: Diagnosis not present

## 2015-08-07 DIAGNOSIS — E11621 Type 2 diabetes mellitus with foot ulcer: Secondary | ICD-10-CM | POA: Diagnosis not present

## 2015-08-07 DIAGNOSIS — L97519 Non-pressure chronic ulcer of other part of right foot with unspecified severity: Secondary | ICD-10-CM | POA: Diagnosis not present

## 2015-08-07 DIAGNOSIS — I350 Nonrheumatic aortic (valve) stenosis: Secondary | ICD-10-CM | POA: Diagnosis not present

## 2015-08-07 DIAGNOSIS — B379 Candidiasis, unspecified: Secondary | ICD-10-CM | POA: Diagnosis not present

## 2015-08-07 DIAGNOSIS — L03115 Cellulitis of right lower limb: Secondary | ICD-10-CM | POA: Diagnosis not present

## 2015-08-07 DIAGNOSIS — Z7982 Long term (current) use of aspirin: Secondary | ICD-10-CM | POA: Diagnosis not present

## 2015-08-07 DIAGNOSIS — Z7984 Long term (current) use of oral hypoglycemic drugs: Secondary | ICD-10-CM | POA: Diagnosis not present

## 2015-08-07 DIAGNOSIS — M1990 Unspecified osteoarthritis, unspecified site: Secondary | ICD-10-CM | POA: Diagnosis not present

## 2015-08-07 DIAGNOSIS — E1159 Type 2 diabetes mellitus with other circulatory complications: Secondary | ICD-10-CM | POA: Diagnosis not present

## 2015-08-09 DIAGNOSIS — B379 Candidiasis, unspecified: Secondary | ICD-10-CM | POA: Diagnosis not present

## 2015-08-09 DIAGNOSIS — D649 Anemia, unspecified: Secondary | ICD-10-CM | POA: Diagnosis not present

## 2015-08-09 DIAGNOSIS — Z7982 Long term (current) use of aspirin: Secondary | ICD-10-CM | POA: Diagnosis not present

## 2015-08-09 DIAGNOSIS — Z7984 Long term (current) use of oral hypoglycemic drugs: Secondary | ICD-10-CM | POA: Diagnosis not present

## 2015-08-09 DIAGNOSIS — L97519 Non-pressure chronic ulcer of other part of right foot with unspecified severity: Secondary | ICD-10-CM | POA: Diagnosis not present

## 2015-08-09 DIAGNOSIS — E1159 Type 2 diabetes mellitus with other circulatory complications: Secondary | ICD-10-CM | POA: Diagnosis not present

## 2015-08-09 DIAGNOSIS — E11621 Type 2 diabetes mellitus with foot ulcer: Secondary | ICD-10-CM | POA: Diagnosis not present

## 2015-08-09 DIAGNOSIS — L03115 Cellulitis of right lower limb: Secondary | ICD-10-CM | POA: Diagnosis not present

## 2015-08-09 DIAGNOSIS — I10 Essential (primary) hypertension: Secondary | ICD-10-CM | POA: Diagnosis not present

## 2015-08-09 DIAGNOSIS — I350 Nonrheumatic aortic (valve) stenosis: Secondary | ICD-10-CM | POA: Diagnosis not present

## 2015-08-09 DIAGNOSIS — M1990 Unspecified osteoarthritis, unspecified site: Secondary | ICD-10-CM | POA: Diagnosis not present

## 2015-08-10 ENCOUNTER — Encounter: Payer: Self-pay | Admitting: Sports Medicine

## 2015-08-10 ENCOUNTER — Ambulatory Visit (INDEPENDENT_AMBULATORY_CARE_PROVIDER_SITE_OTHER): Payer: Medicare Other | Admitting: Sports Medicine

## 2015-08-10 DIAGNOSIS — M2142 Flat foot [pes planus] (acquired), left foot: Secondary | ICD-10-CM | POA: Diagnosis not present

## 2015-08-10 DIAGNOSIS — L97521 Non-pressure chronic ulcer of other part of left foot limited to breakdown of skin: Secondary | ICD-10-CM | POA: Diagnosis not present

## 2015-08-10 DIAGNOSIS — E1142 Type 2 diabetes mellitus with diabetic polyneuropathy: Secondary | ICD-10-CM | POA: Diagnosis not present

## 2015-08-10 DIAGNOSIS — M79671 Pain in right foot: Secondary | ICD-10-CM

## 2015-08-10 DIAGNOSIS — L97512 Non-pressure chronic ulcer of other part of right foot with fat layer exposed: Secondary | ICD-10-CM

## 2015-08-10 DIAGNOSIS — M2141 Flat foot [pes planus] (acquired), right foot: Secondary | ICD-10-CM

## 2015-08-10 NOTE — Progress Notes (Signed)
Patient ID: Emily Miranda, female   DOB: 1940/09/09, 75 y.o.   MRN: [id number]  Subjective: Emily Miranda is a 75 y.o. female patient seen in office for follow up evaluation of ulcerations on right foot; s/p Grafix application #1 at right 1st MTPJ placed last visit. Patient has a history of diabetes and a blood glucose level of 120-130 mg/dl.  Patient has home nursing M/W/F changing outer dressings and applying acticoat at plantar wound. Denies nausea/fever/vomiting/chills/night sweats/shortness of breath/pain. Patient has no other pedal complaints at this time.  Patient Active Problem List   Diagnosis Date Noted  . Abdominal pain, RLQ (right lower quadrant) -chronic since 2009 06/02/2013  . Constipation, chronic 06/02/2013  . SUI (stress urinary incontinence, female) 06/02/2013  . Recurrent UTI (urinary tract infection) 06/02/2013  . HTN (hypertension)   . CHEST PAIN 11/27/2009  . OVERWEIGHT 11/28/2008  . ESSENTIAL HYPERTENSION, BENIGN 11/28/2008  . DM 11/25/2008  . AORTIC STENOSIS 11/25/2008  . PANCREATITIS, HX OF 11/25/2008   Current Outpatient Prescriptions on File Prior to Visit  Medication Sig Dispense Refill  . amLODipine (NORVASC) 5 MG tablet Take 5 mg by mouth daily.     . Ascorbic Acid (VITAMIN C) 100 MG tablet Take 100 mg by mouth daily.      . cephALEXin (KEFLEX) 500 MG capsule Take 1 capsule (500 mg total) by mouth 2 (two) times daily. 28 capsule 0  . chlorthalidone (HYGROTON) 25 MG tablet Take 0.5 tablets (12.5 mg total) by mouth daily. 15 tablet 11  . furosemide (LASIX) 20 MG tablet Take 20 mg by mouth daily.     Marland Kitchen glimepiride (AMARYL) 4 MG tablet Take 4 mg by mouth daily with breakfast.    . HYDROcodone-acetaminophen (NORCO) 5-325 MG per tablet Take 1 tablet by mouth every 6 (six) hours as needed. 10 tablet 0  . lisinopril (PRINIVIL,ZESTRIL) 10 MG tablet Take 10 mg by mouth daily.    . methocarbamol (ROBAXIN) 500 MG tablet Take 1.5 tablets (750 mg total) by mouth  every 8 (eight) hours as needed for muscle spasms (use for muscle cramps/pain). 30 tablet 2  . naproxen (NAPROSYN) 500 MG tablet Take 1 tablet (500 mg total) by mouth 2 (two) times daily with a meal. 40 tablet 1  . nitrofurantoin, macrocrystal-monohydrate, (MACROBID) 100 MG capsule Take 100 mg by mouth 2 (two) times daily.     . ONE TOUCH ULTRA TEST test strip 1 each by Other route once a week.     Marland Kitchen Phenyleph-Doxylamine-DM-APAP (ALKA SELTZER PLUS PO) Take 1 tablet by mouth daily as needed (congestion).    . pregabalin (LYRICA) 75 MG capsule Take 1 capsule (75 mg total) by mouth 2 (two) times daily. 60 capsule 1  . sulfamethoxazole-trimethoprim (BACTRIM DS,SEPTRA DS) 800-160 MG tablet Take 1 tablet by mouth 2 (two) times daily. 20 tablet 0  . TRADJENTA 5 MG TABS tablet Take 5 mg by mouth daily.     . traMADol (ULTRAM) 50 MG tablet Take 50 mg by mouth 2 (two) times daily.      Current Facility-Administered Medications on File Prior to Visit  Medication Dose Route Frequency Provider Last Rate Last Dose  . triamcinolone acetonide (KENALOG) 10 MG/ML injection 10 mg  10 mg Other Once Asencion Islam, DPM       Allergies  Allergen Reactions  . Levofloxacin Nausea And Vomiting    Other reaction(s): Confusion    No results found for this or any previous visit (from the past 2160  hour(s)).  Objective: There were no vitals filed for this visit.  General: Patient is awake, alert, oriented x 3 and in no acute distress.  Dermatology: Skin is warm and dry bilateral with a full thickness ulceration present Right plantar medial midfoot. Ulceration measures 3cm x 2.8cm x 1cm  (last measurement 3.5cm x 3cmx 0.8cm). There is a mildly macerated border with a  mildly Hyper-granular base. The ulceration does not probe to bone. No active drainage however there is serous drainage on the inner guaze layer, no erythema, no edema. No acute signs of infection.  There is also an improving full thickness ulceration  medial aspect of the right first metatarsophalangeal joint the ulceration measures 1.2 cm x 0.4cm x0.2cm  (last measurement 1.5 cm x 1 cm x 0.5 cm).  There is a mildly macerated border with a granular base, the ulceration does not probe to bone, there is no active drainage, no erythema, no edema, no other acute signs of infection present  There is no malodor to the foot at this visit.    Vascular: Dorsalis Pedis pulse = 2/4 Bilateral,  Posterior Tibial pulse = 1/4 Bilateral,  Capillary Fill Time < 5 seconds  Neurologic: Epicritic sensation diminished  to the level of ankle using the 5.07/10g [employer].  Musculosketal:  There is no pain with palpation to the ulceration sites on right foot, pes planus foot type with significant talonavicular bulge, right greater than left foot with residual chronic none active Charcot. No pain with compression to calves bilateral.  Strength acceptable for patient status.   Assessment and Plan:  Problem List Items Addressed This Visit    None    Visit Diagnoses    Right foot pain    -  Primary    Right foot ulcer, with fat layer exposed (HCC)        Diabetic polyneuropathy associated with type 2 diabetes mellitus (HCC)        Pes planus of both feet          -Examined patient and discussed the progression of the wound and treatment alternatives. - Cleansed ulceration sites and entire right foot with wound cleanse and applied castellanis paint at interspaces.  - Excisionally debrided ulceration sites using a tissue nipper to healthy bleeding specifically at the right plantar medial midfoot ulceration cauterized hyper granular tissue utilizing silver nitrate. Due to the hyper-granular nature of this wound and drainage, at this time, I will not place a wound graft at this site. However, at the right first metatarsal phalangeal joint Ulceration placed Osiris grafix prime 5 x 5 cm, lot number [id number] unit #[id number], expiration 06/29/2016, part number  [id number] Used in its entirety at the right first metatarsophalangeal joint. This is the second graft to the site. The graft was secured in place utilizing Mepitel secured with Steri-Strips. All areas were then dressed with 4 x 4's in a bolster dressing fashion and Coban. Patient tolerated the application of the graft well with no immediate complications.  -Cont with home wound nursing orders to change outer dressings on Monday, Wednesday, Friday by placing a 4 x 4 dry gauze at the graft site at the right first metatarsophalangeal joint, being careful not to remove any of the Steri-Strips because graft is below that layer also apply Durafiber absorbent dressing at plantar medial midfoot ulceration secured with 4 x 4, ABDs, Kerlix and Ace. I discussed this nursing order with [facility] home wound care nurse; [phone] -Continue with limited weightbearing for  transfers only with post op shoe. No excessive walking or standing.  -Advised patient to go to the ER or return to office if the wound worsens or if constitutional symptoms are present. -Patient to return to office in 1 weeks for follow up care/possible graft re-application and evaluation or sooner if problems arise.  Asencion Islam, DPM

## 2015-08-11 DIAGNOSIS — I10 Essential (primary) hypertension: Secondary | ICD-10-CM | POA: Diagnosis not present

## 2015-08-11 DIAGNOSIS — D649 Anemia, unspecified: Secondary | ICD-10-CM | POA: Diagnosis not present

## 2015-08-11 DIAGNOSIS — L03115 Cellulitis of right lower limb: Secondary | ICD-10-CM | POA: Diagnosis not present

## 2015-08-11 DIAGNOSIS — E11621 Type 2 diabetes mellitus with foot ulcer: Secondary | ICD-10-CM | POA: Diagnosis not present

## 2015-08-11 DIAGNOSIS — M1990 Unspecified osteoarthritis, unspecified site: Secondary | ICD-10-CM | POA: Diagnosis not present

## 2015-08-11 DIAGNOSIS — B379 Candidiasis, unspecified: Secondary | ICD-10-CM | POA: Diagnosis not present

## 2015-08-11 DIAGNOSIS — Z7982 Long term (current) use of aspirin: Secondary | ICD-10-CM | POA: Diagnosis not present

## 2015-08-11 DIAGNOSIS — E1159 Type 2 diabetes mellitus with other circulatory complications: Secondary | ICD-10-CM | POA: Diagnosis not present

## 2015-08-11 DIAGNOSIS — L97519 Non-pressure chronic ulcer of other part of right foot with unspecified severity: Secondary | ICD-10-CM | POA: Diagnosis not present

## 2015-08-11 DIAGNOSIS — Z7984 Long term (current) use of oral hypoglycemic drugs: Secondary | ICD-10-CM | POA: Diagnosis not present

## 2015-08-11 DIAGNOSIS — I350 Nonrheumatic aortic (valve) stenosis: Secondary | ICD-10-CM | POA: Diagnosis not present

## 2015-08-12 DIAGNOSIS — E11621 Type 2 diabetes mellitus with foot ulcer: Secondary | ICD-10-CM | POA: Diagnosis not present

## 2015-08-12 DIAGNOSIS — L03119 Cellulitis of unspecified part of limb: Secondary | ICD-10-CM | POA: Diagnosis not present

## 2015-08-12 DIAGNOSIS — L02619 Cutaneous abscess of unspecified foot: Secondary | ICD-10-CM | POA: Diagnosis not present

## 2015-08-13 DIAGNOSIS — L02619 Cutaneous abscess of unspecified foot: Secondary | ICD-10-CM | POA: Diagnosis not present

## 2015-08-13 DIAGNOSIS — E11621 Type 2 diabetes mellitus with foot ulcer: Secondary | ICD-10-CM | POA: Diagnosis not present

## 2015-08-13 DIAGNOSIS — L03119 Cellulitis of unspecified part of limb: Secondary | ICD-10-CM | POA: Diagnosis not present

## 2015-08-14 DIAGNOSIS — B379 Candidiasis, unspecified: Secondary | ICD-10-CM | POA: Diagnosis not present

## 2015-08-14 DIAGNOSIS — L97519 Non-pressure chronic ulcer of other part of right foot with unspecified severity: Secondary | ICD-10-CM | POA: Diagnosis not present

## 2015-08-14 DIAGNOSIS — E1159 Type 2 diabetes mellitus with other circulatory complications: Secondary | ICD-10-CM | POA: Diagnosis not present

## 2015-08-14 DIAGNOSIS — I350 Nonrheumatic aortic (valve) stenosis: Secondary | ICD-10-CM | POA: Diagnosis not present

## 2015-08-14 DIAGNOSIS — Z7982 Long term (current) use of aspirin: Secondary | ICD-10-CM | POA: Diagnosis not present

## 2015-08-14 DIAGNOSIS — E11621 Type 2 diabetes mellitus with foot ulcer: Secondary | ICD-10-CM | POA: Diagnosis not present

## 2015-08-14 DIAGNOSIS — I10 Essential (primary) hypertension: Secondary | ICD-10-CM | POA: Diagnosis not present

## 2015-08-14 DIAGNOSIS — D649 Anemia, unspecified: Secondary | ICD-10-CM | POA: Diagnosis not present

## 2015-08-14 DIAGNOSIS — M1990 Unspecified osteoarthritis, unspecified site: Secondary | ICD-10-CM | POA: Diagnosis not present

## 2015-08-14 DIAGNOSIS — Z7984 Long term (current) use of oral hypoglycemic drugs: Secondary | ICD-10-CM | POA: Diagnosis not present

## 2015-08-14 DIAGNOSIS — L03115 Cellulitis of right lower limb: Secondary | ICD-10-CM | POA: Diagnosis not present

## 2015-08-16 DIAGNOSIS — L97519 Non-pressure chronic ulcer of other part of right foot with unspecified severity: Secondary | ICD-10-CM | POA: Diagnosis not present

## 2015-08-16 DIAGNOSIS — D649 Anemia, unspecified: Secondary | ICD-10-CM | POA: Diagnosis not present

## 2015-08-16 DIAGNOSIS — I10 Essential (primary) hypertension: Secondary | ICD-10-CM | POA: Diagnosis not present

## 2015-08-16 DIAGNOSIS — Z7984 Long term (current) use of oral hypoglycemic drugs: Secondary | ICD-10-CM | POA: Diagnosis not present

## 2015-08-16 DIAGNOSIS — I350 Nonrheumatic aortic (valve) stenosis: Secondary | ICD-10-CM | POA: Diagnosis not present

## 2015-08-16 DIAGNOSIS — M1990 Unspecified osteoarthritis, unspecified site: Secondary | ICD-10-CM | POA: Diagnosis not present

## 2015-08-16 DIAGNOSIS — B379 Candidiasis, unspecified: Secondary | ICD-10-CM | POA: Diagnosis not present

## 2015-08-16 DIAGNOSIS — Z7982 Long term (current) use of aspirin: Secondary | ICD-10-CM | POA: Diagnosis not present

## 2015-08-16 DIAGNOSIS — L03115 Cellulitis of right lower limb: Secondary | ICD-10-CM | POA: Diagnosis not present

## 2015-08-16 DIAGNOSIS — E11621 Type 2 diabetes mellitus with foot ulcer: Secondary | ICD-10-CM | POA: Diagnosis not present

## 2015-08-16 DIAGNOSIS — E1159 Type 2 diabetes mellitus with other circulatory complications: Secondary | ICD-10-CM | POA: Diagnosis not present

## 2015-08-17 ENCOUNTER — Encounter: Payer: Self-pay | Admitting: Sports Medicine

## 2015-08-17 ENCOUNTER — Ambulatory Visit (INDEPENDENT_AMBULATORY_CARE_PROVIDER_SITE_OTHER): Payer: Medicare Other | Admitting: Sports Medicine

## 2015-08-17 DIAGNOSIS — M2141 Flat foot [pes planus] (acquired), right foot: Secondary | ICD-10-CM

## 2015-08-17 DIAGNOSIS — L89891 Pressure ulcer of other site, stage 1: Secondary | ICD-10-CM | POA: Diagnosis not present

## 2015-08-17 DIAGNOSIS — M79671 Pain in right foot: Secondary | ICD-10-CM | POA: Diagnosis not present

## 2015-08-17 DIAGNOSIS — E1142 Type 2 diabetes mellitus with diabetic polyneuropathy: Secondary | ICD-10-CM

## 2015-08-17 DIAGNOSIS — M2142 Flat foot [pes planus] (acquired), left foot: Secondary | ICD-10-CM

## 2015-08-17 DIAGNOSIS — L97511 Non-pressure chronic ulcer of other part of right foot limited to breakdown of skin: Secondary | ICD-10-CM

## 2015-08-17 DIAGNOSIS — L97512 Non-pressure chronic ulcer of other part of right foot with fat layer exposed: Secondary | ICD-10-CM

## 2015-08-17 MED ORDER — TRAMADOL HCL 50 MG PO TABS: 50.0000 mg | ORAL_TABLET | Freq: Three times a day (TID) | ORAL | Status: DC | PRN

## 2015-08-17 NOTE — Progress Notes (Signed)
Patient ID: Emily Miranda, female   DOB: [birth year], 75 y.o.   MRN: [id number]  Subjective: Emily Miranda is a 75 y.o. female patient seen in office for follow up evaluation of ulcerations on right foot; s/p Grafix application #2 at right 1st MTPJ placed last visit. Patient has a history of diabetes and a blood glucose level of 120-130 mg/dl which is unchanged from prior.  Patient has home nursing M/W/F changing outer dressings and applying acticoat at plantar wound and dry dressing to the graft site. Denies nausea/fever/vomiting/chills/night sweats/shortness of breath/pain. Patient has no other pedal complaints at this time.  Patient Active Problem List   Diagnosis Date Noted  . Abdominal pain, RLQ (right lower quadrant) -chronic since 2009 06/02/2013  . Constipation, chronic 06/02/2013  . SUI (stress urinary incontinence, female) 06/02/2013  . Recurrent UTI (urinary tract infection) 06/02/2013  . HTN (hypertension)   . CHEST PAIN 11/27/2009  . OVERWEIGHT 11/28/2008  . ESSENTIAL HYPERTENSION, BENIGN 11/28/2008  . DM 11/25/2008  . AORTIC STENOSIS 11/25/2008  . PANCREATITIS, HX OF 11/25/2008   Current Outpatient Prescriptions on File Prior to Visit  Medication Sig Dispense Refill  . amLODipine (NORVASC) 5 MG tablet Take 5 mg by mouth daily.     . Ascorbic Acid (VITAMIN C) 100 MG tablet Take 100 mg by mouth daily.      . cephALEXin (KEFLEX) 500 MG capsule Take 1 capsule (500 mg total) by mouth 2 (two) times daily. 28 capsule 0  . chlorthalidone (HYGROTON) 25 MG tablet Take 0.5 tablets (12.5 mg total) by mouth daily. 15 tablet 11  . furosemide (LASIX) 20 MG tablet Take 20 mg by mouth daily.     Marland Kitchen glimepiride (AMARYL) 4 MG tablet Take 4 mg by mouth daily with breakfast.    . HYDROcodone-acetaminophen (NORCO) 5-325 MG per tablet Take 1 tablet by mouth every 6 (six) hours as needed. 10 tablet 0  . lisinopril (PRINIVIL,ZESTRIL) 10 MG tablet Take 10 mg by mouth daily.    . methocarbamol  (ROBAXIN) 500 MG tablet Take 1.5 tablets (750 mg total) by mouth every 8 (eight) hours as needed for muscle spasms (use for muscle cramps/pain). 30 tablet 2  . naproxen (NAPROSYN) 500 MG tablet Take 1 tablet (500 mg total) by mouth 2 (two) times daily with a meal. 40 tablet 1  . nitrofurantoin, macrocrystal-monohydrate, (MACROBID) 100 MG capsule Take 100 mg by mouth 2 (two) times daily.     . ONE TOUCH ULTRA TEST test strip 1 each by Other route once a week.     Marland Kitchen Phenyleph-Doxylamine-DM-APAP (ALKA SELTZER PLUS PO) Take 1 tablet by mouth daily as needed (congestion).    . pregabalin (LYRICA) 75 MG capsule Take 1 capsule (75 mg total) by mouth 2 (two) times daily. 60 capsule 1  . sulfamethoxazole-trimethoprim (BACTRIM DS,SEPTRA DS) 800-160 MG tablet Take 1 tablet by mouth 2 (two) times daily. 20 tablet 0  . TRADJENTA 5 MG TABS tablet Take 5 mg by mouth daily.      Current Facility-Administered Medications on File Prior to Visit  Medication Dose Route Frequency Provider Last Rate Last Dose  . triamcinolone acetonide (KENALOG) 10 MG/ML injection 10 mg  10 mg Other Once Asencion Islam, DPM       Allergies  Allergen Reactions  . Levofloxacin Nausea And Vomiting    Other reaction(s): Confusion    No results found for this or any previous visit (from the past 2160 hour(s)).  Objective: There were no vitals  filed for this visit.  General: Patient is awake, alert, oriented x 3 and in no acute distress.  Dermatology: Skin is warm and dry bilateral with a full thickness ulceration present Right plantar medial midfoot. Ulceration measures 3cm x 3.5cm x 0.8cm  (last measurement 3cm x 2.8cmx 1cm). There is a mildly macerated border with a mildly Hyper-granular base. There is mild tunneling at the 9:00 portion of the wound bed. The ulceration does not probe to bone. No active drainage however there is serous drainage on the inner guaze layer, no erythema, no edema. No acute signs of infection.  There is  also an improving now partial thickness ulceration medial aspect of the right first metatarsophalangeal joint the ulceration measures 0.2cm x 0.4cm x0.1cm  (last measurement 1.2 cm x 0.4 cm x 0.2 cm).  There is a mildly macerated border with a granular base, the ulceration does not probe to bone, there is no active drainage, no erythema, no edema, no other acute signs of infection present  There is no malodor to the foot at this visit.    Vascular: Dorsalis Pedis pulse = 2/4 Bilateral,  Posterior Tibial pulse = 1/4 Bilateral,  Capillary Fill Time < 5 seconds  Neurologic: Epicritic sensation diminished  to the level of ankle using the 5.07/10g [employer].  Musculosketal:  There is no pain with palpation to the ulceration sites on right foot, pes planus foot type with significant talonavicular bulge, right greater than left foot with residual chronic none active Charcot. No pain with compression to calves bilateral.  Strength acceptable for patient status.   Assessment and Plan:  Problem List Items Addressed This Visit    None    Visit Diagnoses    Right foot pain    -  Primary    Relevant Medications    traMADol (ULTRAM) 50 MG tablet    Other Relevant Orders    Wound culture    Right foot ulcer, with fat layer exposed (HCC)        Relevant Orders    Wound culture    Right foot ulcer, limited to breakdown of skin (HCC)        Diabetic polyneuropathy associated with type 2 diabetes mellitus (HCC)        Relevant Orders    Wound culture    Pes planus of both feet        Relevant Orders    Wound culture      -Examined patient and discussed the progression of the wound and treatment alternatives. - Cleansed ulceration sites and entire right foot with wound cleanse and applied castellanis paint at interspaces.  - Excisionally debrided ulceration sites using a tissue nipper to healthy bleeding specifically at the right plantar medial midfoot ulceration cauterized hyper  granular tissue utilizing silver nitrate. Due to the hyper-granular nature of this wound and drainage, at this time, I will not place a wound graft at this site.I have decided to obtain wound culture to determine if there is any bacteria present in the wound culture is negative, then we will consider grafting at next encounter. - At the right first metatarsal phalangeal joint Ulceration area appeared much improved. Thus, due to small size of wound, not warranting a repeat of the graft at this time. Thus, applied Steri-Strips to bring skin edges together and advised patient to have home nursing tokeep the site clean and dry.  -Cont with home wound nursing orders to change outer dressings on Monday, Wednesday, Friday by  placing a 4 x 4 dry gauze at theright first metatarsophalangeal joint, being careful not to remove any of the Steri-Strips and also apply Durafiber absorbent dressing at plantar medial midfoot ulceration secured with 4 x 4, ABDs, Kerlix and Ace.I ordered more dressing supplies for patient to have at home through [facility]. -Continue with limited weightbearing for transfers only with post op shoe and with the assistance of a rolling walker. No excessive walking or standing.  -Advised patient to go to the ER or return to office if the wound worsens or if constitutional symptoms are present. -Patient to return to office in 1 week for follow up care/possible graft application at right plantar medial ulceration pending wound culture results and evaluation or sooner if problems arise.  Asencion Islam, DPM

## 2015-08-18 ENCOUNTER — Telehealth: Payer: Self-pay | Admitting: *Deleted

## 2015-08-18 DIAGNOSIS — L97519 Non-pressure chronic ulcer of other part of right foot with unspecified severity: Secondary | ICD-10-CM | POA: Diagnosis not present

## 2015-08-18 DIAGNOSIS — E1159 Type 2 diabetes mellitus with other circulatory complications: Secondary | ICD-10-CM | POA: Diagnosis not present

## 2015-08-18 DIAGNOSIS — I350 Nonrheumatic aortic (valve) stenosis: Secondary | ICD-10-CM | POA: Diagnosis not present

## 2015-08-18 DIAGNOSIS — Z7982 Long term (current) use of aspirin: Secondary | ICD-10-CM | POA: Diagnosis not present

## 2015-08-18 DIAGNOSIS — B379 Candidiasis, unspecified: Secondary | ICD-10-CM | POA: Diagnosis not present

## 2015-08-18 DIAGNOSIS — Z7984 Long term (current) use of oral hypoglycemic drugs: Secondary | ICD-10-CM | POA: Diagnosis not present

## 2015-08-18 DIAGNOSIS — I10 Essential (primary) hypertension: Secondary | ICD-10-CM | POA: Diagnosis not present

## 2015-08-18 DIAGNOSIS — E11621 Type 2 diabetes mellitus with foot ulcer: Secondary | ICD-10-CM | POA: Diagnosis not present

## 2015-08-18 DIAGNOSIS — L03115 Cellulitis of right lower limb: Secondary | ICD-10-CM | POA: Diagnosis not present

## 2015-08-18 DIAGNOSIS — M1990 Unspecified osteoarthritis, unspecified site: Secondary | ICD-10-CM | POA: Diagnosis not present

## 2015-08-18 DIAGNOSIS — D649 Anemia, unspecified: Secondary | ICD-10-CM | POA: Diagnosis not present

## 2015-08-18 NOTE — Telephone Encounter (Signed)
Emily Miranda states received orders without pt insurance information, please fax to [phone].  Done.

## 2015-08-20 DIAGNOSIS — L02619 Cutaneous abscess of unspecified foot: Secondary | ICD-10-CM | POA: Diagnosis not present

## 2015-08-20 DIAGNOSIS — E11621 Type 2 diabetes mellitus with foot ulcer: Secondary | ICD-10-CM | POA: Diagnosis not present

## 2015-08-20 DIAGNOSIS — L03119 Cellulitis of unspecified part of limb: Secondary | ICD-10-CM | POA: Diagnosis not present

## 2015-08-21 DIAGNOSIS — E11621 Type 2 diabetes mellitus with foot ulcer: Secondary | ICD-10-CM | POA: Diagnosis not present

## 2015-08-21 DIAGNOSIS — L97519 Non-pressure chronic ulcer of other part of right foot with unspecified severity: Secondary | ICD-10-CM | POA: Diagnosis not present

## 2015-08-21 DIAGNOSIS — E1159 Type 2 diabetes mellitus with other circulatory complications: Secondary | ICD-10-CM | POA: Diagnosis not present

## 2015-08-21 DIAGNOSIS — L97512 Non-pressure chronic ulcer of other part of right foot with fat layer exposed: Secondary | ICD-10-CM | POA: Diagnosis not present

## 2015-08-21 DIAGNOSIS — M1990 Unspecified osteoarthritis, unspecified site: Secondary | ICD-10-CM | POA: Diagnosis not present

## 2015-08-21 DIAGNOSIS — B379 Candidiasis, unspecified: Secondary | ICD-10-CM | POA: Diagnosis not present

## 2015-08-21 DIAGNOSIS — Z7982 Long term (current) use of aspirin: Secondary | ICD-10-CM | POA: Diagnosis not present

## 2015-08-21 DIAGNOSIS — I350 Nonrheumatic aortic (valve) stenosis: Secondary | ICD-10-CM | POA: Diagnosis not present

## 2015-08-21 DIAGNOSIS — D649 Anemia, unspecified: Secondary | ICD-10-CM | POA: Diagnosis not present

## 2015-08-21 DIAGNOSIS — I10 Essential (primary) hypertension: Secondary | ICD-10-CM | POA: Diagnosis not present

## 2015-08-21 DIAGNOSIS — L97511 Non-pressure chronic ulcer of other part of right foot limited to breakdown of skin: Secondary | ICD-10-CM | POA: Diagnosis not present

## 2015-08-21 DIAGNOSIS — Z7984 Long term (current) use of oral hypoglycemic drugs: Secondary | ICD-10-CM | POA: Diagnosis not present

## 2015-08-21 DIAGNOSIS — L03115 Cellulitis of right lower limb: Secondary | ICD-10-CM | POA: Diagnosis not present

## 2015-08-23 DIAGNOSIS — B379 Candidiasis, unspecified: Secondary | ICD-10-CM | POA: Diagnosis not present

## 2015-08-23 DIAGNOSIS — I10 Essential (primary) hypertension: Secondary | ICD-10-CM | POA: Diagnosis not present

## 2015-08-23 DIAGNOSIS — I350 Nonrheumatic aortic (valve) stenosis: Secondary | ICD-10-CM | POA: Diagnosis not present

## 2015-08-23 DIAGNOSIS — Z7984 Long term (current) use of oral hypoglycemic drugs: Secondary | ICD-10-CM | POA: Diagnosis not present

## 2015-08-23 DIAGNOSIS — E1159 Type 2 diabetes mellitus with other circulatory complications: Secondary | ICD-10-CM | POA: Diagnosis not present

## 2015-08-23 DIAGNOSIS — E11621 Type 2 diabetes mellitus with foot ulcer: Secondary | ICD-10-CM | POA: Diagnosis not present

## 2015-08-23 DIAGNOSIS — L97519 Non-pressure chronic ulcer of other part of right foot with unspecified severity: Secondary | ICD-10-CM | POA: Diagnosis not present

## 2015-08-23 DIAGNOSIS — M1990 Unspecified osteoarthritis, unspecified site: Secondary | ICD-10-CM | POA: Diagnosis not present

## 2015-08-23 DIAGNOSIS — D649 Anemia, unspecified: Secondary | ICD-10-CM | POA: Diagnosis not present

## 2015-08-23 DIAGNOSIS — L03115 Cellulitis of right lower limb: Secondary | ICD-10-CM | POA: Diagnosis not present

## 2015-08-23 DIAGNOSIS — Z7982 Long term (current) use of aspirin: Secondary | ICD-10-CM | POA: Diagnosis not present

## 2015-08-24 ENCOUNTER — Ambulatory Visit: Payer: Medicare Other | Admitting: Sports Medicine

## 2015-08-24 ENCOUNTER — Encounter: Payer: Self-pay | Admitting: Sports Medicine

## 2015-08-24 ENCOUNTER — Ambulatory Visit (INDEPENDENT_AMBULATORY_CARE_PROVIDER_SITE_OTHER): Payer: Medicare Other | Admitting: Sports Medicine

## 2015-08-24 ENCOUNTER — Telehealth: Payer: Self-pay | Admitting: *Deleted

## 2015-08-24 DIAGNOSIS — M79671 Pain in right foot: Secondary | ICD-10-CM | POA: Diagnosis not present

## 2015-08-24 DIAGNOSIS — L97512 Non-pressure chronic ulcer of other part of right foot with fat layer exposed: Secondary | ICD-10-CM | POA: Diagnosis not present

## 2015-08-24 DIAGNOSIS — M2141 Flat foot [pes planus] (acquired), right foot: Secondary | ICD-10-CM | POA: Diagnosis not present

## 2015-08-24 DIAGNOSIS — L97511 Non-pressure chronic ulcer of other part of right foot limited to breakdown of skin: Secondary | ICD-10-CM | POA: Diagnosis not present

## 2015-08-24 DIAGNOSIS — M2142 Flat foot [pes planus] (acquired), left foot: Secondary | ICD-10-CM | POA: Diagnosis not present

## 2015-08-24 DIAGNOSIS — E1142 Type 2 diabetes mellitus with diabetic polyneuropathy: Secondary | ICD-10-CM | POA: Diagnosis not present

## 2015-08-24 NOTE — Telephone Encounter (Signed)
Wound culture results requested by Dr. Marylene Land to be faxed to [facility] office.

## 2015-08-24 NOTE — Progress Notes (Signed)
Patient ID: Emily Miranda, female   DOB: 1941/01/05, 75 y.o.   MRN: [id number] Subjective: Emily Miranda is a 75 y.o. female patient seen in office for follow up evaluation of ulcerations on right foot; s/p Grafix application #2 at right 1st MTPJ placed 2 weeks ago. Patient has a history of diabetes and a blood glucose level of 120-130 mg/dl which is unchanged from prior.  Patient has home nursing M/W/F changing outer dressings and applying acticoat at plantar wound and dry dressing to the 1st MTPJ graft site. Denies nausea/fever/vomiting/chills/night sweats/shortness of breath/pain. Patient has no other pedal complaints at this time.  Patient Active Problem List   Diagnosis Date Noted  . Abdominal pain, RLQ (right lower quadrant) -chronic since 2009 06/02/2013  . Constipation, chronic 06/02/2013  . SUI (stress urinary incontinence, female) 06/02/2013  . Recurrent UTI (urinary tract infection) 06/02/2013  . HTN (hypertension)   . CHEST PAIN 11/27/2009  . OVERWEIGHT 11/28/2008  . ESSENTIAL HYPERTENSION, BENIGN 11/28/2008  . DM 11/25/2008  . AORTIC STENOSIS 11/25/2008  . PANCREATITIS, HX OF 11/25/2008   Current Outpatient Prescriptions on File Prior to Visit  Medication Sig Dispense Refill  . amLODipine (NORVASC) 5 MG tablet Take 5 mg by mouth daily.     . Ascorbic Acid (VITAMIN C) 100 MG tablet Take 100 mg by mouth daily.      . cephALEXin (KEFLEX) 500 MG capsule Take 1 capsule (500 mg total) by mouth 2 (two) times daily. 28 capsule 0  . chlorthalidone (HYGROTON) 25 MG tablet Take 0.5 tablets (12.5 mg total) by mouth daily. 15 tablet 11  . furosemide (LASIX) 20 MG tablet Take 20 mg by mouth daily.     Marland Kitchen glimepiride (AMARYL) 4 MG tablet Take 4 mg by mouth daily with breakfast.    . HYDROcodone-acetaminophen (NORCO) 5-325 MG per tablet Take 1 tablet by mouth every 6 (six) hours as needed. 10 tablet 0  . lisinopril (PRINIVIL,ZESTRIL) 10 MG tablet Take 10 mg by mouth daily.    .  methocarbamol (ROBAXIN) 500 MG tablet Take 1.5 tablets (750 mg total) by mouth every 8 (eight) hours as needed for muscle spasms (use for muscle cramps/pain). 30 tablet 2  . naproxen (NAPROSYN) 500 MG tablet Take 1 tablet (500 mg total) by mouth 2 (two) times daily with a meal. 40 tablet 1  . nitrofurantoin, macrocrystal-monohydrate, (MACROBID) 100 MG capsule Take 100 mg by mouth 2 (two) times daily.     . ONE TOUCH ULTRA TEST test strip 1 each by Other route once a week.     Marland Kitchen Phenyleph-Doxylamine-DM-APAP (ALKA SELTZER PLUS PO) Take 1 tablet by mouth daily as needed (congestion).    . pregabalin (LYRICA) 75 MG capsule Take 1 capsule (75 mg total) by mouth 2 (two) times daily. 60 capsule 1  . sulfamethoxazole-trimethoprim (BACTRIM DS,SEPTRA DS) 800-160 MG tablet Take 1 tablet by mouth 2 (two) times daily. 20 tablet 0  . TRADJENTA 5 MG TABS tablet Take 5 mg by mouth daily.     . traMADol (ULTRAM) 50 MG tablet Take 1 tablet (50 mg total) by mouth every 8 (eight) hours as needed. 30 tablet 0   Current Facility-Administered Medications on File Prior to Visit  Medication Dose Route Frequency Provider Last Rate Last Dose  . triamcinolone acetonide (KENALOG) 10 MG/ML injection 10 mg  10 mg Other Once Asencion Islam, DPM       Allergies  Allergen Reactions  . Levofloxacin Nausea And Vomiting  Other reaction(s): Confusion    No results found for this or any previous visit (from the past 2160 hour(s)).  Objective: There were no vitals filed for this visit.  General: Patient is awake, alert, oriented x 3 and in no acute distress.  Dermatology: Skin is warm and dry bilateral with a full thickness ulceration present Right plantar medial midfoot. Ulceration measures 3cm x 2.5cm x 0.8cm  (last measurement 3cm x 3.5cmx 0.8cm). There is a mildly macerated border with a granular base. There is mild tunneling at the 9:00 portion of the wound bed that measures 0.8cm deep. The ulceration does not probe to  bone. No active drainage however there is serous drainage on the inner guaze layer, no erythema, no edema. No acute signs of infection.  There is also an improving now prematurely healed ulceration medial aspect of the right first metatarsophalangeal joint that is prematurely closed (last measurement 0.2cm x 0.4cm x0.1cm).  There is no erythema, no edema, no other acute signs of infection present   Vascular: Dorsalis Pedis pulse = 2/4 Bilateral,  Posterior Tibial pulse = 1/4 Bilateral,  Capillary Fill Time < 5 seconds  Neurologic: Epicritic sensation diminished  to the level of ankle using the 5.07/10g [employer].  Musculosketal:  There is no pain with palpation to the ulceration sites on right foot, pes planus foot type with significant talonavicular bulge, right greater than left foot with residual chronic none active Charcot. No pain with compression to calves bilateral.  Strength acceptable for patient status.   Wound culture: No growth   Assessment and Plan:  Problem List Items Addressed This Visit    None    Visit Diagnoses    Right foot pain    -  Primary    Right foot ulcer, with fat layer exposed (HCC)        Plantar medial midfoot    Right foot ulcer, limited to breakdown of skin (HCC)        1st MTPJ, prematurely closed    Diabetic polyneuropathy associated with type 2 diabetes mellitus (HCC)        Pes planus of both feet          -Examined patient and discussed the progression of the wound and treatment alternatives. -Cleansed sites and entire right foot with wound cleanse and applied castellanis paint at interspaces.  -To the right medial midfoot ulcer, Excisionally debrided ulceration site using a tissue nipper to healthy bleeding then applied Grafix prime 5 x 5 cm, lot number [id number] unit #[id number], expiration 01/03/2017, [place] [id number] to the right medial midfoot ulceration. The graft was used in its entirety with no waste and secured with Mepitel,  Steri-Strips and dressed with a bolster offloading 4 x 4 gauze and Ace wrap bandage. This is graft #1 to the right medial midfoot ulceration. Patient tolerated graft application well. -At the right first metatarsal phalangeal joint Ulceration area appeared much improved/Prematurely healed. Thus, not warranting a repeat of the graft at this time. Thus, applied Steri-Strips to keep the skin edges together. -Cont with home wound nursing orders to change outer dressings on Monday, Wednesday, Friday by placing a 4 x 4 dry gauze at theright first metatarsophalangeal joint, being careful not to remove any of the Steri-Strips and also apply Durafiber absorbent dressing at plantar medial midfoot ulceration being careful not to disturb the graft or the Steri-Strips Were placed at the graft site, secured with 4 x 4, ABDs, Kerlix and Ace; Supplies ordered  from [place].  -Continue with limited weightbearing for transfers only with post op shoe and with the assistance of a rolling walker. No excessive walking or standing.  -Advised patient to go to the ER or return to office if the wound worsens or if constitutional symptoms are present. -Patient to return to office in 1 week for follow up care/possible graft re-application at right plantar medial ulceration and evaluation or sooner if problems arise.  Asencion Islam, DPM

## 2015-08-28 DIAGNOSIS — L03115 Cellulitis of right lower limb: Secondary | ICD-10-CM | POA: Diagnosis not present

## 2015-08-28 DIAGNOSIS — E1159 Type 2 diabetes mellitus with other circulatory complications: Secondary | ICD-10-CM | POA: Diagnosis not present

## 2015-08-28 DIAGNOSIS — I350 Nonrheumatic aortic (valve) stenosis: Secondary | ICD-10-CM | POA: Diagnosis not present

## 2015-08-28 DIAGNOSIS — E11621 Type 2 diabetes mellitus with foot ulcer: Secondary | ICD-10-CM | POA: Diagnosis not present

## 2015-08-28 DIAGNOSIS — B379 Candidiasis, unspecified: Secondary | ICD-10-CM | POA: Diagnosis not present

## 2015-08-28 DIAGNOSIS — L97519 Non-pressure chronic ulcer of other part of right foot with unspecified severity: Secondary | ICD-10-CM | POA: Diagnosis not present

## 2015-08-28 DIAGNOSIS — Z7984 Long term (current) use of oral hypoglycemic drugs: Secondary | ICD-10-CM | POA: Diagnosis not present

## 2015-08-28 DIAGNOSIS — I10 Essential (primary) hypertension: Secondary | ICD-10-CM | POA: Diagnosis not present

## 2015-08-28 DIAGNOSIS — M1990 Unspecified osteoarthritis, unspecified site: Secondary | ICD-10-CM | POA: Diagnosis not present

## 2015-08-28 DIAGNOSIS — D649 Anemia, unspecified: Secondary | ICD-10-CM | POA: Diagnosis not present

## 2015-08-28 DIAGNOSIS — Z7982 Long term (current) use of aspirin: Secondary | ICD-10-CM | POA: Diagnosis not present

## 2015-08-31 ENCOUNTER — Encounter: Payer: Self-pay | Admitting: Sports Medicine

## 2015-08-31 ENCOUNTER — Ambulatory Visit: Payer: Medicare Other | Admitting: Sports Medicine

## 2015-08-31 ENCOUNTER — Telehealth: Payer: Self-pay | Admitting: *Deleted

## 2015-08-31 ENCOUNTER — Ambulatory Visit (INDEPENDENT_AMBULATORY_CARE_PROVIDER_SITE_OTHER): Payer: Medicare Other | Admitting: Sports Medicine

## 2015-08-31 DIAGNOSIS — E1142 Type 2 diabetes mellitus with diabetic polyneuropathy: Secondary | ICD-10-CM

## 2015-08-31 DIAGNOSIS — L97512 Non-pressure chronic ulcer of other part of right foot with fat layer exposed: Secondary | ICD-10-CM

## 2015-08-31 DIAGNOSIS — M2141 Flat foot [pes planus] (acquired), right foot: Secondary | ICD-10-CM

## 2015-08-31 DIAGNOSIS — M79671 Pain in right foot: Secondary | ICD-10-CM

## 2015-08-31 DIAGNOSIS — M2142 Flat foot [pes planus] (acquired), left foot: Secondary | ICD-10-CM

## 2015-08-31 NOTE — Telephone Encounter (Addendum)
-----   Message from Asencion Islam, [place] sent at 08/31/2015  4:31 PM EDT ----- Regarding: Wound Care Dressings Patient has guaze dressings as supplied by [employer]. Can we order Durafiber absorbant or some type of absorbant dressing layer for a mild to moderately draining wound. Thanks Dr. Marylene Land. Durafiber or similar dressing layer order sent to [facility] with pt demographics.  09/05/2015-Tramadol refill called to [facility].  09/14/2015-[facility] states pathologist needs clinical for pt's current biopsy.  Faxed to [phone].

## 2015-08-31 NOTE — Progress Notes (Addendum)
Patient ID: Emily Miranda, female   DOB: [birth year], 75 y.o.   MRN: [id number]  Subjective: LARAYA Miranda is a 75 y.o. female patient seen in office for follow up evaluation of ulcerations on right foot; s/p Grafix application #2 at right 1st MTPJ placed 3 weeks ago and Grafix application #1 at right plantar medial midfoot last week. Patient has a history of diabetes and a blood glucose level of 120-130 mg/dl which is unchanged from prior.  Patient has home nursing M/W/F changing outer dressings and applying acticoat at plantar wound and dry dressing to the 1st MTPJ previous graft site. Denies nausea/fever/vomiting/chills/night sweats/shortness of breath/pain. Patient has no other pedal complaints at this time.  Patient Active Problem List   Diagnosis Date Noted  . Abdominal pain, RLQ (right lower quadrant) -chronic since 2009 06/02/2013  . Constipation, chronic 06/02/2013  . SUI (stress urinary incontinence, female) 06/02/2013  . Recurrent UTI (urinary tract infection) 06/02/2013  . HTN (hypertension)   . CHEST PAIN 11/27/2009  . OVERWEIGHT 11/28/2008  . ESSENTIAL HYPERTENSION, BENIGN 11/28/2008  . DM 11/25/2008  . AORTIC STENOSIS 11/25/2008  . PANCREATITIS, HX OF 11/25/2008   Current Outpatient Prescriptions on File Prior to Visit  Medication Sig Dispense Refill  . amLODipine (NORVASC) 5 MG tablet Take 5 mg by mouth daily.     . Ascorbic Acid (VITAMIN C) 100 MG tablet Take 100 mg by mouth daily.      . cephALEXin (KEFLEX) 500 MG capsule Take 1 capsule (500 mg total) by mouth 2 (two) times daily. 28 capsule 0  . chlorthalidone (HYGROTON) 25 MG tablet Take 0.5 tablets (12.5 mg total) by mouth daily. 15 tablet 11  . furosemide (LASIX) 20 MG tablet Take 20 mg by mouth daily.     Marland Kitchen glimepiride (AMARYL) 4 MG tablet Take 4 mg by mouth daily with breakfast.    . HYDROcodone-acetaminophen (NORCO) 5-325 MG per tablet Take 1 tablet by mouth every 6 (six) hours as needed. 10 tablet 0  .  lisinopril (PRINIVIL,ZESTRIL) 10 MG tablet Take 10 mg by mouth daily.    . methocarbamol (ROBAXIN) 500 MG tablet Take 1.5 tablets (750 mg total) by mouth every 8 (eight) hours as needed for muscle spasms (use for muscle cramps/pain). 30 tablet 2  . naproxen (NAPROSYN) 500 MG tablet Take 1 tablet (500 mg total) by mouth 2 (two) times daily with a meal. 40 tablet 1  . nitrofurantoin, macrocrystal-monohydrate, (MACROBID) 100 MG capsule Take 100 mg by mouth 2 (two) times daily.     . ONE TOUCH ULTRA TEST test strip 1 each by Other route once a week.     Marland Kitchen Phenyleph-Doxylamine-DM-APAP (ALKA SELTZER PLUS PO) Take 1 tablet by mouth daily as needed (congestion).    . pregabalin (LYRICA) 75 MG capsule Take 1 capsule (75 mg total) by mouth 2 (two) times daily. 60 capsule 1  . sulfamethoxazole-trimethoprim (BACTRIM DS,SEPTRA DS) 800-160 MG tablet Take 1 tablet by mouth 2 (two) times daily. 20 tablet 0  . TRADJENTA 5 MG TABS tablet Take 5 mg by mouth daily.     . traMADol (ULTRAM) 50 MG tablet Take 1 tablet (50 mg total) by mouth every 8 (eight) hours as needed. 30 tablet 0   Current Facility-Administered Medications on File Prior to Visit  Medication Dose Route Frequency Provider Last Rate Last Dose  . triamcinolone acetonide (KENALOG) 10 MG/ML injection 10 mg  10 mg Other Once Asencion Islam, DPM  Allergies  Allergen Reactions  . Levofloxacin Nausea And Vomiting    Other reaction(s): Confusion    No results found for this or any previous visit (from the past 2160 hour(s)).  Objective: There were no vitals filed for this visit.  General: Patient is awake, alert, oriented x 3 and in no acute distress.  Dermatology: Skin is warm and dry bilateral with a full thickness ulceration present Right plantar medial midfoot. Ulceration measures 3cm x 2.3cm x 0.4cm  (last measurement 3cm x 2.5cmx 0.8cm). There is a mildly macerated border with a granular base. Mild tunneling at the 9:00 portion of the  wound bed that measures 0.4cm deep, improving in nature. The ulceration does not probe to bone. No active drainage however there is serous drainage on the inner guaze layer, no erythema, mild edema, no warmth. No acute signs of infection.  Right first metatarsophalangeal joint ulceration remains prematurely closed. There is no erythema, no edema, no other acute signs of infection present   Vascular: Dorsalis Pedis pulse = 2/4 Bilateral,  Posterior Tibial pulse = 1/4 Bilateral,  Capillary Fill Time < 5 seconds  Neurologic: Epicritic sensation diminished  to the level of ankle using the 5.07/10g [employer].  Musculosketal:  There is no pain with palpation to the ulceration sites on right foot, pes planus foot type with significant talonavicular bulge, right greater than left foot with residual chronic none active Charcot. No pain with compression to calves bilateral.  Strength acceptable for patient status.   Assessment and Plan:  Problem List Items Addressed This Visit    None    Visit Diagnoses    Right foot pain    -  Primary    Right foot ulcer, with fat layer exposed (HCC)        Diabetic polyneuropathy associated with type 2 diabetes mellitus (HCC)        Pes planus of both feet          -Examined patient and discussed the progression of the wound and treatment alternatives..  -To the right medial midfoot ulcer, Excisionally debrided ulceration site using a tissue nipper to healthy bleeding then applied Grafix prime 5 x 5 cm, lot number [id number] unit #[id number], expiration 02/19/2017, [id number] to the right medial midfoot ulceration. The graft was used in its entirety with no waste and secured with Mepitel, Steri-Strips and dressed with a bolster offloading 4 x 4 gauze and Ace wrap bandage. This is graft #2 to the right medial midfoot ulceration. Patient tolerated graft application well. -At the right first metatarsal phalangeal joint Ulceration area continues to  be healed; Thus, not warranting a repeat of the graft at this time.  -Cont with home wound nursing orders to change outer dressings on Monday, Wednesday, Friday by placing a 4 x 4 dry gauze at theright first metatarsophalangeal joint, being careful not to remove any of the Steri-Strips and also apply Durafiber absorbent dressing at plantar medial midfoot ulceration being careful not to disturb the graft or the Steri-Strips at the graft site, secured with 4 x 4, ABDs, Kerlix and Ace; Patient reports that she feels independent and being able to do dressings herself, thus I called homecare nurse Rosey Bath and informed her that Friday of this week will be her last day coming to visit the patient; patient will therefore take over the dressing changes consisting of changes of the outer dressing layers and a placing absorbent gauze adjacent to the graft layer; Dressing Supplies ordered  from [place].  -Continue with limited weightbearing for transfers only with post op shoe and with the assistance of a rolling walker. No excessive walking or standing.  -Advised patient to go to the ER or return to office if the wound worsens or if constitutional symptoms are present. -Patient to return to office in 1 week for follow up care/possible graft re-application at right plantar medial ulceration and evaluation or sooner if problems arise. May consider [facility] consult for possible hyperbaric oxygen or other treatment modalities to assist with closing wound sooner.  *Patient desires to get better by June for a wedding anniversary day trip/outting*  Asencion Islam, DPM

## 2015-09-01 DIAGNOSIS — D649 Anemia, unspecified: Secondary | ICD-10-CM | POA: Diagnosis not present

## 2015-09-01 DIAGNOSIS — I10 Essential (primary) hypertension: Secondary | ICD-10-CM | POA: Diagnosis not present

## 2015-09-01 DIAGNOSIS — L97519 Non-pressure chronic ulcer of other part of right foot with unspecified severity: Secondary | ICD-10-CM | POA: Diagnosis not present

## 2015-09-01 DIAGNOSIS — Z7984 Long term (current) use of oral hypoglycemic drugs: Secondary | ICD-10-CM | POA: Diagnosis not present

## 2015-09-01 DIAGNOSIS — Z7982 Long term (current) use of aspirin: Secondary | ICD-10-CM | POA: Diagnosis not present

## 2015-09-01 DIAGNOSIS — L03115 Cellulitis of right lower limb: Secondary | ICD-10-CM | POA: Diagnosis not present

## 2015-09-01 DIAGNOSIS — E1159 Type 2 diabetes mellitus with other circulatory complications: Secondary | ICD-10-CM | POA: Diagnosis not present

## 2015-09-01 DIAGNOSIS — M1990 Unspecified osteoarthritis, unspecified site: Secondary | ICD-10-CM | POA: Diagnosis not present

## 2015-09-01 DIAGNOSIS — B379 Candidiasis, unspecified: Secondary | ICD-10-CM | POA: Diagnosis not present

## 2015-09-01 DIAGNOSIS — E11621 Type 2 diabetes mellitus with foot ulcer: Secondary | ICD-10-CM | POA: Diagnosis not present

## 2015-09-01 DIAGNOSIS — L97512 Non-pressure chronic ulcer of other part of right foot with fat layer exposed: Secondary | ICD-10-CM | POA: Diagnosis not present

## 2015-09-01 DIAGNOSIS — I350 Nonrheumatic aortic (valve) stenosis: Secondary | ICD-10-CM | POA: Diagnosis not present

## 2015-09-04 ENCOUNTER — Other Ambulatory Visit: Payer: Self-pay | Admitting: Sports Medicine

## 2015-09-07 ENCOUNTER — Other Ambulatory Visit: Payer: Self-pay | Admitting: Sports Medicine

## 2015-09-07 ENCOUNTER — Ambulatory Visit (INDEPENDENT_AMBULATORY_CARE_PROVIDER_SITE_OTHER): Payer: Medicare Other | Admitting: Sports Medicine

## 2015-09-07 ENCOUNTER — Encounter: Payer: Self-pay | Admitting: Sports Medicine

## 2015-09-07 DIAGNOSIS — E1142 Type 2 diabetes mellitus with diabetic polyneuropathy: Secondary | ICD-10-CM

## 2015-09-07 DIAGNOSIS — I96 Gangrene, not elsewhere classified: Secondary | ICD-10-CM | POA: Diagnosis not present

## 2015-09-07 DIAGNOSIS — L539 Erythematous condition, unspecified: Secondary | ICD-10-CM | POA: Diagnosis not present

## 2015-09-07 DIAGNOSIS — I10 Essential (primary) hypertension: Secondary | ICD-10-CM | POA: Diagnosis not present

## 2015-09-07 DIAGNOSIS — K589 Irritable bowel syndrome without diarrhea: Secondary | ICD-10-CM | POA: Diagnosis not present

## 2015-09-07 DIAGNOSIS — M2142 Flat foot [pes planus] (acquired), left foot: Secondary | ICD-10-CM | POA: Diagnosis not present

## 2015-09-07 DIAGNOSIS — E1159 Type 2 diabetes mellitus with other circulatory complications: Secondary | ICD-10-CM | POA: Diagnosis not present

## 2015-09-07 DIAGNOSIS — Z881 Allergy status to other antibiotic agents status: Secondary | ICD-10-CM | POA: Diagnosis not present

## 2015-09-07 DIAGNOSIS — M19071 Primary osteoarthritis, right ankle and foot: Secondary | ICD-10-CM | POA: Diagnosis not present

## 2015-09-07 DIAGNOSIS — L97413 Non-pressure chronic ulcer of right heel and midfoot with necrosis of muscle: Secondary | ICD-10-CM | POA: Diagnosis not present

## 2015-09-07 DIAGNOSIS — L97511 Non-pressure chronic ulcer of other part of right foot limited to breakdown of skin: Secondary | ICD-10-CM | POA: Diagnosis not present

## 2015-09-07 DIAGNOSIS — B952 Enterococcus as the cause of diseases classified elsewhere: Secondary | ICD-10-CM | POA: Diagnosis not present

## 2015-09-07 DIAGNOSIS — D638 Anemia in other chronic diseases classified elsewhere: Secondary | ICD-10-CM | POA: Diagnosis not present

## 2015-09-07 DIAGNOSIS — L02611 Cutaneous abscess of right foot: Secondary | ICD-10-CM | POA: Diagnosis not present

## 2015-09-07 DIAGNOSIS — L03115 Cellulitis of right lower limb: Secondary | ICD-10-CM | POA: Diagnosis not present

## 2015-09-07 DIAGNOSIS — M65871 Other synovitis and tenosynovitis, right ankle and foot: Secondary | ICD-10-CM | POA: Diagnosis not present

## 2015-09-07 DIAGNOSIS — N189 Chronic kidney disease, unspecified: Secondary | ICD-10-CM | POA: Diagnosis not present

## 2015-09-07 DIAGNOSIS — K219 Gastro-esophageal reflux disease without esophagitis: Secondary | ICD-10-CM | POA: Diagnosis not present

## 2015-09-07 DIAGNOSIS — R600 Localized edema: Secondary | ICD-10-CM | POA: Diagnosis not present

## 2015-09-07 DIAGNOSIS — E1140 Type 2 diabetes mellitus with diabetic neuropathy, unspecified: Secondary | ICD-10-CM | POA: Diagnosis not present

## 2015-09-07 DIAGNOSIS — Z79899 Other long term (current) drug therapy: Secondary | ICD-10-CM | POA: Diagnosis not present

## 2015-09-07 DIAGNOSIS — M2141 Flat foot [pes planus] (acquired), right foot: Secondary | ICD-10-CM

## 2015-09-07 DIAGNOSIS — L03031 Cellulitis of right toe: Secondary | ICD-10-CM | POA: Diagnosis not present

## 2015-09-07 DIAGNOSIS — L97512 Non-pressure chronic ulcer of other part of right foot with fat layer exposed: Secondary | ICD-10-CM | POA: Diagnosis not present

## 2015-09-07 DIAGNOSIS — Z7982 Long term (current) use of aspirin: Secondary | ICD-10-CM | POA: Diagnosis not present

## 2015-09-07 DIAGNOSIS — E1122 Type 2 diabetes mellitus with diabetic chronic kidney disease: Secondary | ICD-10-CM | POA: Diagnosis not present

## 2015-09-07 DIAGNOSIS — L97519 Non-pressure chronic ulcer of other part of right foot with unspecified severity: Secondary | ICD-10-CM | POA: Diagnosis not present

## 2015-09-07 DIAGNOSIS — E7800 Pure hypercholesterolemia, unspecified: Secondary | ICD-10-CM | POA: Diagnosis not present

## 2015-09-07 DIAGNOSIS — B9561 Methicillin susceptible Staphylococcus aureus infection as the cause of diseases classified elsewhere: Secondary | ICD-10-CM | POA: Diagnosis not present

## 2015-09-07 DIAGNOSIS — M1990 Unspecified osteoarthritis, unspecified site: Secondary | ICD-10-CM | POA: Diagnosis not present

## 2015-09-07 DIAGNOSIS — B964 Proteus (mirabilis) (morganii) as the cause of diseases classified elsewhere: Secondary | ICD-10-CM | POA: Diagnosis not present

## 2015-09-07 DIAGNOSIS — E11621 Type 2 diabetes mellitus with foot ulcer: Secondary | ICD-10-CM | POA: Diagnosis not present

## 2015-09-07 DIAGNOSIS — I129 Hypertensive chronic kidney disease with stage 1 through stage 4 chronic kidney disease, or unspecified chronic kidney disease: Secondary | ICD-10-CM | POA: Diagnosis not present

## 2015-09-07 DIAGNOSIS — I519 Heart disease, unspecified: Secondary | ICD-10-CM | POA: Diagnosis not present

## 2015-09-07 NOTE — Progress Notes (Signed)
Patient ID: Emily Miranda, female   DOB: [birth year], 75 y.o.   MRN: [id number] Subjective: Emily Miranda is a 75 y.o. female patient seen in office for follow up evaluation of ulcerations on right foot; s/p Grafix application #2 at right 1st MTPJ placed 4 weeks ago and Grafix application #2 at right plantar medial midfoot last week. Patient has a history of diabetes and a blood glucose level of 120-130 mg/dl which is unchanged from prior.  Patient comes to office today stating that her right big toe has swollen and is read and is concerning for infection. Reports that over the weekend she had blistered with pus coming from area. Reports that she had her last visit from home nurse on Friday and states that she noticed that it was a little red, then however continue to wait at home until today's visit for evaluation. Denies nausea/fever/vomiting/chills/night sweats/shortness of breath/pain. Patient has no other pedal complaints at this time.  Patient Active Problem List   Diagnosis Date Noted  . Abdominal pain, RLQ (right lower quadrant) -chronic since 2009 06/02/2013  . Constipation, chronic 06/02/2013  . SUI (stress urinary incontinence, female) 06/02/2013  . Recurrent UTI (urinary tract infection) 06/02/2013  . HTN (hypertension)   . CHEST PAIN 11/27/2009  . OVERWEIGHT 11/28/2008  . ESSENTIAL HYPERTENSION, BENIGN 11/28/2008  . DM 11/25/2008  . AORTIC STENOSIS 11/25/2008  . PANCREATITIS, HX OF 11/25/2008   Current Outpatient Prescriptions on File Prior to Visit  Medication Sig Dispense Refill  . amLODipine (NORVASC) 5 MG tablet Take 5 mg by mouth daily.     . Ascorbic Acid (VITAMIN C) 100 MG tablet Take 100 mg by mouth daily.      . cephALEXin (KEFLEX) 500 MG capsule Take 1 capsule (500 mg total) by mouth 2 (two) times daily. 28 capsule 0  . chlorthalidone (HYGROTON) 25 MG tablet Take 0.5 tablets (12.5 mg total) by mouth daily. 15 tablet 11  . furosemide (LASIX) 20 MG tablet Take 20 mg  by mouth daily.     Marland Kitchen glimepiride (AMARYL) 4 MG tablet Take 4 mg by mouth daily with breakfast.    . HYDROcodone-acetaminophen (NORCO) 5-325 MG per tablet Take 1 tablet by mouth every 6 (six) hours as needed. 10 tablet 0  . lisinopril (PRINIVIL,ZESTRIL) 10 MG tablet Take 10 mg by mouth daily.    . methocarbamol (ROBAXIN) 500 MG tablet Take 1.5 tablets (750 mg total) by mouth every 8 (eight) hours as needed for muscle spasms (use for muscle cramps/pain). 30 tablet 2  . naproxen (NAPROSYN) 500 MG tablet Take 1 tablet (500 mg total) by mouth 2 (two) times daily with a meal. 40 tablet 1  . nitrofurantoin, macrocrystal-monohydrate, (MACROBID) 100 MG capsule Take 100 mg by mouth 2 (two) times daily.     . ONE TOUCH ULTRA TEST test strip 1 each by Other route once a week.     Marland Kitchen Phenyleph-Doxylamine-DM-APAP (ALKA SELTZER PLUS PO) Take 1 tablet by mouth daily as needed (congestion).    . pregabalin (LYRICA) 75 MG capsule Take 1 capsule (75 mg total) by mouth 2 (two) times daily. 60 capsule 1  . sulfamethoxazole-trimethoprim (BACTRIM DS,SEPTRA DS) 800-160 MG tablet Take 1 tablet by mouth 2 (two) times daily. 20 tablet 0  . TRADJENTA 5 MG TABS tablet Take 5 mg by mouth daily.     . traMADol (ULTRAM) 50 MG tablet TAKE 1 TABLET EVERY 8 HOURS AS NEEDED 30 tablet 0   Current Facility-Administered Medications on  File Prior to Visit  Medication Dose Route Frequency Provider Last Rate Last Dose  . triamcinolone acetonide (KENALOG) 10 MG/ML injection 10 mg  10 mg Other Once Asencion Islam, DPM       Allergies  Allergen Reactions  . Levofloxacin Nausea And Vomiting    Other reaction(s): Confusion    No results found for this or any previous visit (from the past 2160 hour(s)).  Objective: There were no vitals filed for this visit.  General: Patient is awake, alert, oriented x 3 and in no acute distress.  Dermatology: Skin is warm and dry bilateral with a full thickness ulceration present Right plantar  medial midfoot. Ulceration measures 3cm x 2.3cm x 0.4cm  (last measurement same). There is a mildly macerated border with a hyper-granular base. Mild tunneling at the 9:00 portion of the wound bed that measures 0.4cm deep, same as pervious. The ulceration does not probe to bone. No active drainage however there is serous drainage on the inner guaze layer as previous, no erythema, mild edema, no warmth. No acute signs of infection.  Right first metatarsophalangeal joint ulceration has re-opened measuring 0.2cm x 0.1cm x0.2cm with granular base and mildly macerated border. There is no tunneling or undermining. There is mild erythema and edema, no other acute signs of infection present.  NEW ULCERATION WITH INFECTION at Right hallux encompassing entire nail bed measuring approximately 2x1cm with a fibro-granular base and significant erythema to the level of the MPJ warmth, edema. The ulceration does not probe to bone or undermine.    Vascular: Dorsalis Pedis pulse = 2/4 Bilateral,  Posterior Tibial pulse = 1/4 Bilateral,  Capillary Fill Time < 5 seconds  Neurologic: Epicritic sensation diminished  to the level of ankle using the 5.07/10g [employer].  Musculosketal:  There is no pain with palpation to the ulceration sites on right foot, pes planus foot type with significant talonavicular bulge, right greater than left foot with residual chronic none active Charcot. No pain with compression to calves bilateral.  Strength acceptable for patient status.   Assessment and Plan:  Problem List Items Addressed This Visit    None    Visit Diagnoses    Cellulitis and abscess of toe, right    -  Primary    Hallux    Relevant Orders    Wound culture    Wound culture    Wound culture    Right foot ulcer, with fat layer exposed (HCC)        Plantar medial midfoot with excessive hyper-granular tissue    Relevant Orders    Dermatology pathology    Right foot ulcer, limited to breakdown of  skin (HCC)        Medial first MPJ    Pes planus of both feet        Diabetic polyneuropathy associated with type 2 diabetes mellitus (HCC)        Relevant Orders    Dermatology pathology      -Examined patient and discussed the progression of the wound and treatment alternatives. -Recommend admission to hospital for IV antibiotics in the acute setting of the right hallux ulceration with extensive swelling, erythema and warmth to the right hallux today in office. Advised patient that I will take cultures of these 3 wounds as well as a tissue biopsy of the right plantar medial midfoot ulceration since its hyper-granular appearance; will follow-up these culture results outpatient basis. However, most urgently patient requires ER and hospital admission for acute  infection of the hallux thus, I called and spoke with the on-call physician and made him aware of Patient admission -Recommend admission for IV antibiotics, Laboratory blood work, X-rays, wound cultures in the setting of cellulitis with Diabetic foot ulcerations; I will follow the patient wants admitted for continued care -After cultures and tissue biopsy. I applied silver nitrate to the bleeding area of the right plantar medial ulceration and then applied dry sterile gauze secured with Coban. -Continue with limited weightbearing for transfers only with post op shoe and with the assistance of a rolling walker. No excessive walking or standing.  -Patient to return to office after discharge from hospital. We'll continue to follow patient on inpatient basis until discharge.  Asencion Islam, DPM

## 2015-09-08 DIAGNOSIS — E11621 Type 2 diabetes mellitus with foot ulcer: Secondary | ICD-10-CM

## 2015-09-08 DIAGNOSIS — L03115 Cellulitis of right lower limb: Secondary | ICD-10-CM

## 2015-09-11 LAB — WOUND CULTURE

## 2015-09-13 DIAGNOSIS — M1990 Unspecified osteoarthritis, unspecified site: Secondary | ICD-10-CM | POA: Diagnosis not present

## 2015-09-13 DIAGNOSIS — Z7984 Long term (current) use of oral hypoglycemic drugs: Secondary | ICD-10-CM | POA: Diagnosis not present

## 2015-09-13 DIAGNOSIS — I129 Hypertensive chronic kidney disease with stage 1 through stage 4 chronic kidney disease, or unspecified chronic kidney disease: Secondary | ICD-10-CM | POA: Diagnosis not present

## 2015-09-13 DIAGNOSIS — E11621 Type 2 diabetes mellitus with foot ulcer: Secondary | ICD-10-CM | POA: Diagnosis not present

## 2015-09-13 DIAGNOSIS — E1159 Type 2 diabetes mellitus with other circulatory complications: Secondary | ICD-10-CM | POA: Diagnosis not present

## 2015-09-13 DIAGNOSIS — I350 Nonrheumatic aortic (valve) stenosis: Secondary | ICD-10-CM | POA: Diagnosis not present

## 2015-09-13 DIAGNOSIS — D649 Anemia, unspecified: Secondary | ICD-10-CM | POA: Diagnosis not present

## 2015-09-13 DIAGNOSIS — Z792 Long term (current) use of antibiotics: Secondary | ICD-10-CM | POA: Diagnosis not present

## 2015-09-13 DIAGNOSIS — L97519 Non-pressure chronic ulcer of other part of right foot with unspecified severity: Secondary | ICD-10-CM | POA: Diagnosis not present

## 2015-09-13 DIAGNOSIS — E1140 Type 2 diabetes mellitus with diabetic neuropathy, unspecified: Secondary | ICD-10-CM | POA: Diagnosis not present

## 2015-09-13 DIAGNOSIS — N181 Chronic kidney disease, stage 1: Secondary | ICD-10-CM | POA: Diagnosis not present

## 2015-09-13 DIAGNOSIS — Z79891 Long term (current) use of opiate analgesic: Secondary | ICD-10-CM | POA: Diagnosis not present

## 2015-09-13 DIAGNOSIS — L03115 Cellulitis of right lower limb: Secondary | ICD-10-CM | POA: Diagnosis not present

## 2015-09-14 ENCOUNTER — Ambulatory Visit: Payer: Medicare Other | Admitting: Sports Medicine

## 2015-09-14 DIAGNOSIS — N181 Chronic kidney disease, stage 1: Secondary | ICD-10-CM | POA: Diagnosis not present

## 2015-09-14 DIAGNOSIS — Z7984 Long term (current) use of oral hypoglycemic drugs: Secondary | ICD-10-CM | POA: Diagnosis not present

## 2015-09-14 DIAGNOSIS — E1159 Type 2 diabetes mellitus with other circulatory complications: Secondary | ICD-10-CM | POA: Diagnosis not present

## 2015-09-14 DIAGNOSIS — L97519 Non-pressure chronic ulcer of other part of right foot with unspecified severity: Secondary | ICD-10-CM | POA: Diagnosis not present

## 2015-09-14 DIAGNOSIS — D649 Anemia, unspecified: Secondary | ICD-10-CM | POA: Diagnosis not present

## 2015-09-14 DIAGNOSIS — E1140 Type 2 diabetes mellitus with diabetic neuropathy, unspecified: Secondary | ICD-10-CM | POA: Diagnosis not present

## 2015-09-14 DIAGNOSIS — E11621 Type 2 diabetes mellitus with foot ulcer: Secondary | ICD-10-CM | POA: Diagnosis not present

## 2015-09-14 DIAGNOSIS — L03115 Cellulitis of right lower limb: Secondary | ICD-10-CM | POA: Diagnosis not present

## 2015-09-14 DIAGNOSIS — I129 Hypertensive chronic kidney disease with stage 1 through stage 4 chronic kidney disease, or unspecified chronic kidney disease: Secondary | ICD-10-CM | POA: Diagnosis not present

## 2015-09-14 DIAGNOSIS — I350 Nonrheumatic aortic (valve) stenosis: Secondary | ICD-10-CM | POA: Diagnosis not present

## 2015-09-14 DIAGNOSIS — Z79891 Long term (current) use of opiate analgesic: Secondary | ICD-10-CM | POA: Diagnosis not present

## 2015-09-14 DIAGNOSIS — M1990 Unspecified osteoarthritis, unspecified site: Secondary | ICD-10-CM | POA: Diagnosis not present

## 2015-09-14 DIAGNOSIS — Z792 Long term (current) use of antibiotics: Secondary | ICD-10-CM | POA: Diagnosis not present

## 2015-09-15 DIAGNOSIS — I350 Nonrheumatic aortic (valve) stenosis: Secondary | ICD-10-CM | POA: Diagnosis not present

## 2015-09-15 DIAGNOSIS — Z79891 Long term (current) use of opiate analgesic: Secondary | ICD-10-CM | POA: Diagnosis not present

## 2015-09-15 DIAGNOSIS — E11621 Type 2 diabetes mellitus with foot ulcer: Secondary | ICD-10-CM | POA: Diagnosis not present

## 2015-09-15 DIAGNOSIS — M1990 Unspecified osteoarthritis, unspecified site: Secondary | ICD-10-CM | POA: Diagnosis not present

## 2015-09-15 DIAGNOSIS — I129 Hypertensive chronic kidney disease with stage 1 through stage 4 chronic kidney disease, or unspecified chronic kidney disease: Secondary | ICD-10-CM | POA: Diagnosis not present

## 2015-09-15 DIAGNOSIS — N181 Chronic kidney disease, stage 1: Secondary | ICD-10-CM | POA: Diagnosis not present

## 2015-09-15 DIAGNOSIS — E1140 Type 2 diabetes mellitus with diabetic neuropathy, unspecified: Secondary | ICD-10-CM | POA: Diagnosis not present

## 2015-09-15 DIAGNOSIS — L03115 Cellulitis of right lower limb: Secondary | ICD-10-CM | POA: Diagnosis not present

## 2015-09-15 DIAGNOSIS — Z792 Long term (current) use of antibiotics: Secondary | ICD-10-CM | POA: Diagnosis not present

## 2015-09-15 DIAGNOSIS — E1159 Type 2 diabetes mellitus with other circulatory complications: Secondary | ICD-10-CM | POA: Diagnosis not present

## 2015-09-15 DIAGNOSIS — D649 Anemia, unspecified: Secondary | ICD-10-CM | POA: Diagnosis not present

## 2015-09-15 DIAGNOSIS — Z7984 Long term (current) use of oral hypoglycemic drugs: Secondary | ICD-10-CM | POA: Diagnosis not present

## 2015-09-15 DIAGNOSIS — L97519 Non-pressure chronic ulcer of other part of right foot with unspecified severity: Secondary | ICD-10-CM | POA: Diagnosis not present

## 2015-09-16 DIAGNOSIS — E1140 Type 2 diabetes mellitus with diabetic neuropathy, unspecified: Secondary | ICD-10-CM | POA: Diagnosis not present

## 2015-09-16 DIAGNOSIS — L03115 Cellulitis of right lower limb: Secondary | ICD-10-CM | POA: Diagnosis not present

## 2015-09-16 DIAGNOSIS — I350 Nonrheumatic aortic (valve) stenosis: Secondary | ICD-10-CM | POA: Diagnosis not present

## 2015-09-16 DIAGNOSIS — E11621 Type 2 diabetes mellitus with foot ulcer: Secondary | ICD-10-CM | POA: Diagnosis not present

## 2015-09-16 DIAGNOSIS — Z7984 Long term (current) use of oral hypoglycemic drugs: Secondary | ICD-10-CM | POA: Diagnosis not present

## 2015-09-16 DIAGNOSIS — M1990 Unspecified osteoarthritis, unspecified site: Secondary | ICD-10-CM | POA: Diagnosis not present

## 2015-09-16 DIAGNOSIS — I129 Hypertensive chronic kidney disease with stage 1 through stage 4 chronic kidney disease, or unspecified chronic kidney disease: Secondary | ICD-10-CM | POA: Diagnosis not present

## 2015-09-16 DIAGNOSIS — E1159 Type 2 diabetes mellitus with other circulatory complications: Secondary | ICD-10-CM | POA: Diagnosis not present

## 2015-09-16 DIAGNOSIS — Z79891 Long term (current) use of opiate analgesic: Secondary | ICD-10-CM | POA: Diagnosis not present

## 2015-09-16 DIAGNOSIS — Z792 Long term (current) use of antibiotics: Secondary | ICD-10-CM | POA: Diagnosis not present

## 2015-09-16 DIAGNOSIS — L97519 Non-pressure chronic ulcer of other part of right foot with unspecified severity: Secondary | ICD-10-CM | POA: Diagnosis not present

## 2015-09-16 DIAGNOSIS — D649 Anemia, unspecified: Secondary | ICD-10-CM | POA: Diagnosis not present

## 2015-09-16 DIAGNOSIS — N181 Chronic kidney disease, stage 1: Secondary | ICD-10-CM | POA: Diagnosis not present

## 2015-09-17 DIAGNOSIS — D649 Anemia, unspecified: Secondary | ICD-10-CM | POA: Diagnosis not present

## 2015-09-17 DIAGNOSIS — E11621 Type 2 diabetes mellitus with foot ulcer: Secondary | ICD-10-CM | POA: Diagnosis not present

## 2015-09-17 DIAGNOSIS — Z79891 Long term (current) use of opiate analgesic: Secondary | ICD-10-CM | POA: Diagnosis not present

## 2015-09-17 DIAGNOSIS — Z7984 Long term (current) use of oral hypoglycemic drugs: Secondary | ICD-10-CM | POA: Diagnosis not present

## 2015-09-17 DIAGNOSIS — M1990 Unspecified osteoarthritis, unspecified site: Secondary | ICD-10-CM | POA: Diagnosis not present

## 2015-09-17 DIAGNOSIS — E1140 Type 2 diabetes mellitus with diabetic neuropathy, unspecified: Secondary | ICD-10-CM | POA: Diagnosis not present

## 2015-09-17 DIAGNOSIS — L97519 Non-pressure chronic ulcer of other part of right foot with unspecified severity: Secondary | ICD-10-CM | POA: Diagnosis not present

## 2015-09-17 DIAGNOSIS — N181 Chronic kidney disease, stage 1: Secondary | ICD-10-CM | POA: Diagnosis not present

## 2015-09-17 DIAGNOSIS — I129 Hypertensive chronic kidney disease with stage 1 through stage 4 chronic kidney disease, or unspecified chronic kidney disease: Secondary | ICD-10-CM | POA: Diagnosis not present

## 2015-09-17 DIAGNOSIS — Z792 Long term (current) use of antibiotics: Secondary | ICD-10-CM | POA: Diagnosis not present

## 2015-09-17 DIAGNOSIS — L03115 Cellulitis of right lower limb: Secondary | ICD-10-CM | POA: Diagnosis not present

## 2015-09-17 DIAGNOSIS — E1159 Type 2 diabetes mellitus with other circulatory complications: Secondary | ICD-10-CM | POA: Diagnosis not present

## 2015-09-17 DIAGNOSIS — I350 Nonrheumatic aortic (valve) stenosis: Secondary | ICD-10-CM | POA: Diagnosis not present

## 2015-09-18 DIAGNOSIS — E11621 Type 2 diabetes mellitus with foot ulcer: Secondary | ICD-10-CM | POA: Diagnosis not present

## 2015-09-18 DIAGNOSIS — Z79891 Long term (current) use of opiate analgesic: Secondary | ICD-10-CM | POA: Diagnosis not present

## 2015-09-18 DIAGNOSIS — E1140 Type 2 diabetes mellitus with diabetic neuropathy, unspecified: Secondary | ICD-10-CM | POA: Diagnosis not present

## 2015-09-18 DIAGNOSIS — L97519 Non-pressure chronic ulcer of other part of right foot with unspecified severity: Secondary | ICD-10-CM | POA: Diagnosis not present

## 2015-09-18 DIAGNOSIS — N181 Chronic kidney disease, stage 1: Secondary | ICD-10-CM | POA: Diagnosis not present

## 2015-09-18 DIAGNOSIS — Z792 Long term (current) use of antibiotics: Secondary | ICD-10-CM | POA: Diagnosis not present

## 2015-09-18 DIAGNOSIS — L03115 Cellulitis of right lower limb: Secondary | ICD-10-CM | POA: Diagnosis not present

## 2015-09-18 DIAGNOSIS — E1159 Type 2 diabetes mellitus with other circulatory complications: Secondary | ICD-10-CM | POA: Diagnosis not present

## 2015-09-18 DIAGNOSIS — I350 Nonrheumatic aortic (valve) stenosis: Secondary | ICD-10-CM | POA: Diagnosis not present

## 2015-09-18 DIAGNOSIS — I129 Hypertensive chronic kidney disease with stage 1 through stage 4 chronic kidney disease, or unspecified chronic kidney disease: Secondary | ICD-10-CM | POA: Diagnosis not present

## 2015-09-18 DIAGNOSIS — M1990 Unspecified osteoarthritis, unspecified site: Secondary | ICD-10-CM | POA: Diagnosis not present

## 2015-09-18 DIAGNOSIS — D649 Anemia, unspecified: Secondary | ICD-10-CM | POA: Diagnosis not present

## 2015-09-18 DIAGNOSIS — Z7984 Long term (current) use of oral hypoglycemic drugs: Secondary | ICD-10-CM | POA: Diagnosis not present

## 2015-09-19 DIAGNOSIS — Z79891 Long term (current) use of opiate analgesic: Secondary | ICD-10-CM | POA: Diagnosis not present

## 2015-09-19 DIAGNOSIS — E11621 Type 2 diabetes mellitus with foot ulcer: Secondary | ICD-10-CM | POA: Diagnosis not present

## 2015-09-19 DIAGNOSIS — E1140 Type 2 diabetes mellitus with diabetic neuropathy, unspecified: Secondary | ICD-10-CM | POA: Diagnosis not present

## 2015-09-19 DIAGNOSIS — I350 Nonrheumatic aortic (valve) stenosis: Secondary | ICD-10-CM | POA: Diagnosis not present

## 2015-09-19 DIAGNOSIS — M1990 Unspecified osteoarthritis, unspecified site: Secondary | ICD-10-CM | POA: Diagnosis not present

## 2015-09-19 DIAGNOSIS — E1159 Type 2 diabetes mellitus with other circulatory complications: Secondary | ICD-10-CM | POA: Diagnosis not present

## 2015-09-19 DIAGNOSIS — Z7984 Long term (current) use of oral hypoglycemic drugs: Secondary | ICD-10-CM | POA: Diagnosis not present

## 2015-09-19 DIAGNOSIS — I129 Hypertensive chronic kidney disease with stage 1 through stage 4 chronic kidney disease, or unspecified chronic kidney disease: Secondary | ICD-10-CM | POA: Diagnosis not present

## 2015-09-19 DIAGNOSIS — L97519 Non-pressure chronic ulcer of other part of right foot with unspecified severity: Secondary | ICD-10-CM | POA: Diagnosis not present

## 2015-09-19 DIAGNOSIS — D649 Anemia, unspecified: Secondary | ICD-10-CM | POA: Diagnosis not present

## 2015-09-19 DIAGNOSIS — N181 Chronic kidney disease, stage 1: Secondary | ICD-10-CM | POA: Diagnosis not present

## 2015-09-19 DIAGNOSIS — Z792 Long term (current) use of antibiotics: Secondary | ICD-10-CM | POA: Diagnosis not present

## 2015-09-19 DIAGNOSIS — L03115 Cellulitis of right lower limb: Secondary | ICD-10-CM | POA: Diagnosis not present

## 2015-09-20 DIAGNOSIS — D649 Anemia, unspecified: Secondary | ICD-10-CM | POA: Diagnosis not present

## 2015-09-20 DIAGNOSIS — E11621 Type 2 diabetes mellitus with foot ulcer: Secondary | ICD-10-CM | POA: Diagnosis not present

## 2015-09-20 DIAGNOSIS — M1990 Unspecified osteoarthritis, unspecified site: Secondary | ICD-10-CM | POA: Diagnosis not present

## 2015-09-20 DIAGNOSIS — E1159 Type 2 diabetes mellitus with other circulatory complications: Secondary | ICD-10-CM | POA: Diagnosis not present

## 2015-09-20 DIAGNOSIS — Z792 Long term (current) use of antibiotics: Secondary | ICD-10-CM | POA: Diagnosis not present

## 2015-09-20 DIAGNOSIS — L03115 Cellulitis of right lower limb: Secondary | ICD-10-CM | POA: Diagnosis not present

## 2015-09-20 DIAGNOSIS — Z79891 Long term (current) use of opiate analgesic: Secondary | ICD-10-CM | POA: Diagnosis not present

## 2015-09-20 DIAGNOSIS — N181 Chronic kidney disease, stage 1: Secondary | ICD-10-CM | POA: Diagnosis not present

## 2015-09-20 DIAGNOSIS — Z7984 Long term (current) use of oral hypoglycemic drugs: Secondary | ICD-10-CM | POA: Diagnosis not present

## 2015-09-20 DIAGNOSIS — L97519 Non-pressure chronic ulcer of other part of right foot with unspecified severity: Secondary | ICD-10-CM | POA: Diagnosis not present

## 2015-09-20 DIAGNOSIS — I350 Nonrheumatic aortic (valve) stenosis: Secondary | ICD-10-CM | POA: Diagnosis not present

## 2015-09-20 DIAGNOSIS — I129 Hypertensive chronic kidney disease with stage 1 through stage 4 chronic kidney disease, or unspecified chronic kidney disease: Secondary | ICD-10-CM | POA: Diagnosis not present

## 2015-09-20 DIAGNOSIS — E1140 Type 2 diabetes mellitus with diabetic neuropathy, unspecified: Secondary | ICD-10-CM | POA: Diagnosis not present

## 2015-09-21 ENCOUNTER — Encounter: Payer: Self-pay | Admitting: Sports Medicine

## 2015-09-21 ENCOUNTER — Ambulatory Visit (INDEPENDENT_AMBULATORY_CARE_PROVIDER_SITE_OTHER): Payer: Medicare Other | Admitting: Sports Medicine

## 2015-09-21 DIAGNOSIS — L97513 Non-pressure chronic ulcer of other part of right foot with necrosis of muscle: Secondary | ICD-10-CM | POA: Diagnosis not present

## 2015-09-21 DIAGNOSIS — M2142 Flat foot [pes planus] (acquired), left foot: Secondary | ICD-10-CM | POA: Diagnosis not present

## 2015-09-21 DIAGNOSIS — M79671 Pain in right foot: Secondary | ICD-10-CM

## 2015-09-21 DIAGNOSIS — M2141 Flat foot [pes planus] (acquired), right foot: Secondary | ICD-10-CM | POA: Diagnosis not present

## 2015-09-21 DIAGNOSIS — L02611 Cutaneous abscess of right foot: Secondary | ICD-10-CM | POA: Diagnosis not present

## 2015-09-21 DIAGNOSIS — E1142 Type 2 diabetes mellitus with diabetic polyneuropathy: Secondary | ICD-10-CM | POA: Diagnosis not present

## 2015-09-21 DIAGNOSIS — Z9889 Other specified postprocedural states: Secondary | ICD-10-CM

## 2015-09-21 DIAGNOSIS — L03031 Cellulitis of right toe: Secondary | ICD-10-CM | POA: Diagnosis not present

## 2015-09-21 MED ORDER — TETRACYCLINE HCL 500 MG PO CAPS: 500.0000 mg | ORAL_CAPSULE | Freq: Two times a day (BID) | ORAL | Status: DC

## 2015-09-21 MED ORDER — AMPICILLIN 500 MG PO CAPS: 500.0000 mg | ORAL_CAPSULE | Freq: Four times a day (QID) | ORAL | Status: DC

## 2015-09-21 NOTE — Progress Notes (Signed)
Patient ID: Emily Miranda, female   DOB: 06-Jan-1941, 75 y.o.   MRN: [id number]   Subjective: Emily Miranda is a 75 y.o. female patient seen in office for follow up evaluation of ulceration on right foot s/p incision and drainage with bone and tissue culture and hospital admission on 09/08/2015 patient reports that she has daily home nursing has been doing okay at home. Denies any other issues at this time. Denies nausea/fever/vomiting/chills/night sweats/shortness of breath/pain.   Reports fasting blood sugar has been good.   Patient Active Problem List   Diagnosis Date Noted  . Abdominal pain, RLQ (right lower quadrant) -chronic since 2009 06/02/2013  . Constipation, chronic 06/02/2013  . SUI (stress urinary incontinence, female) 06/02/2013  . Recurrent UTI (urinary tract infection) 06/02/2013  . HTN (hypertension)   . CHEST PAIN 11/27/2009  . OVERWEIGHT 11/28/2008  . ESSENTIAL HYPERTENSION, BENIGN 11/28/2008  . DM 11/25/2008  . AORTIC STENOSIS 11/25/2008  . PANCREATITIS, HX OF 11/25/2008   Current Outpatient Prescriptions on File Prior to Visit  Medication Sig Dispense Refill  . amLODipine (NORVASC) 5 MG tablet Take 5 mg by mouth daily.     . Ascorbic Acid (VITAMIN C) 100 MG tablet Take 100 mg by mouth daily.      . cephALEXin (KEFLEX) 500 MG capsule Take 1 capsule (500 mg total) by mouth 2 (two) times daily. 28 capsule 0  . chlorthalidone (HYGROTON) 25 MG tablet Take 0.5 tablets (12.5 mg total) by mouth daily. 15 tablet 11  . furosemide (LASIX) 20 MG tablet Take 20 mg by mouth daily.     Marland Kitchen glimepiride (AMARYL) 4 MG tablet Take 4 mg by mouth daily with breakfast.    . HYDROcodone-acetaminophen (NORCO) 5-325 MG per tablet Take 1 tablet by mouth every 6 (six) hours as needed. 10 tablet 0  . lisinopril (PRINIVIL,ZESTRIL) 10 MG tablet Take 10 mg by mouth daily.    . methocarbamol (ROBAXIN) 500 MG tablet Take 1.5 tablets (750 mg total) by mouth every 8 (eight) hours as needed for  muscle spasms (use for muscle cramps/pain). 30 tablet 2  . naproxen (NAPROSYN) 500 MG tablet Take 1 tablet (500 mg total) by mouth 2 (two) times daily with a meal. 40 tablet 1  . nitrofurantoin, macrocrystal-monohydrate, (MACROBID) 100 MG capsule Take 100 mg by mouth 2 (two) times daily.     . ONE TOUCH ULTRA TEST test strip 1 each by Other route once a week.     Marland Kitchen Phenyleph-Doxylamine-DM-APAP (ALKA SELTZER PLUS PO) Take 1 tablet by mouth daily as needed (congestion).    . pregabalin (LYRICA) 75 MG capsule Take 1 capsule (75 mg total) by mouth 2 (two) times daily. 60 capsule 1  . sulfamethoxazole-trimethoprim (BACTRIM DS,SEPTRA DS) 800-160 MG tablet Take 1 tablet by mouth 2 (two) times daily. 20 tablet 0  . TRADJENTA 5 MG TABS tablet Take 5 mg by mouth daily.     . traMADol (ULTRAM) 50 MG tablet TAKE 1 TABLET EVERY 8 HOURS AS NEEDED 30 tablet 0   Current Facility-Administered Medications on File Prior to Visit  Medication Dose Route Frequency Provider Last Rate Last Dose  . triamcinolone acetonide (KENALOG) 10 MG/ML injection 10 mg  10 mg Other Once Asencion Islam, DPM       Allergies  Allergen Reactions  . Levofloxacin Nausea And Vomiting    Other reaction(s): Confusion    Recent Results (from the past 2160 hour(s))  Wound culture     Status: Abnormal  Collection Time: 09/07/15  4:03 PM  Result Value Ref Range   Gram Stain Result Final report    Result 1 Comment     Comment: No white blood cells seen.   RESULT 2 Comment     Comment: Few gram negative rods.   Aerobic Bacterial Culture Final report (A)    Result 1 Proteus mirabilis (A)     Comment: Heavy growth   Result 2 Staphylococcus aureus (A)     Comment: Moderate growth Based on resistance to penicillin and susceptibility to oxacillin this isolate would be susceptible to: * Penicillinase-stable penicillins; such as:     Cloxacillin     Dicloxacillin     Nafcillin * Beta-lactam/beta-lactamase inhibitor combinations; such  as:     Amoxicillin-clavulanic acid     Ampicillin-sulbactam * Antistaphylococcal cephems; such as:     Cefaclor     Cefuroxime * Antistaphylococcal carbapenems; such as:     Imipenem     Meropenem    ANTIMICROBIAL SUSCEPTIBILITY Comment     Comment:       ** S = Susceptible; I = Intermediate; R = Resistant **                    P = Positive; N = Negative             MICS are expressed in micrograms per mL    Antibiotic                 RSLT#1    RSLT#2    RSLT#3    RSLT#4 Amoxicillin/Clavulanic Acid    S Ampicillin                     S Cefepime                       S Ceftriaxone                    S Cefuroxime                     S Ciprofloxacin                  S         S Clindamycin                              S Ertapenem                      S Erythromycin                             S Gentamicin                     S         S Levofloxacin                   S         S Linezolid                                S Moxifloxacin  S Oxacillin                                S Penicillin                               R Piperacillin                   S Quinupristin/Dalfopristin                S Rifampin                                 S Tetracycline                   R         S Tobr amycin                     S Trimethoprim/Sulfa             R         S Vancomycin                               S     Objective: There were no vitals filed for this visit.  General: Patient is awake, alert, oriented x 3 and in no acute distress.  Dermatology: Skin is warm and dry bilateral with a full thickness ulceration down to the level of muscle measuring 10 cm in length by approximately 4 cm in width at the widest point extending from the plantar medial midfoot all the way to the hallux, status post incision and drainage right foot with mild granular buds and coverage of underlying bone noted, there is rolled borders at the wound margins with 1+ pitting edema,  no warmth, no erythema, no cellulitis, no malodor, no active drainage, however, there is mild serous drainage noted on the inner dressing layers. Lateral aspect of the right hallux appears to be resolved at new ulceration site. There was noted last visit. There is dry blood to be right third toenail with no acute signs of infection.  Vascular: Dorsalis Pedis pulse = 2/4 Bilateral,  Posterior Tibial pulse = 1/4 Bilateral,  Capillary Fill Time < 5 seconds  Neurologic: Epicritic sensation diminished  to the level of ankle using the 5.07/10g [employer].  Musculosketal:  There is no pain with palpation to the ulceration site on right foot, pes planus foot type with significant talonavicular bulge, right greater than left foot with residual chronic none active Charcot. No pain with compression to calves bilateral.  Strength acceptable for patient status.   Assessment and Plan:  Problem List Items Addressed This Visit    None    Visit Diagnoses    S/P foot surgery, right    -  Primary    Incision and drainage with wound and bone culture, 09-08-15    Right foot ulcer, with necrosis of muscle (HCC)        Cellulitis and abscess of toe, right        Pes planus of both feet        Diabetic polyneuropathy associated with type 2 diabetes mellitus (HCC)        Right foot pain          -  Examined patient and discussed the progression of the wound and treatment alternatives. -Cleansed ulceration with antimicrobial spray and then packed open with dry gauze and applied dry sterile dressing secured with Ace wrap. Daily Nursing orders updated to change dressing using antimicrobral wound wash and aquacell AG hydrofiber with silver or medline maxon extra ag silver alginate flat rope. -Microbiology and pathology reports were reviewed from inpatient hospital stay and from outpatient collection from [facility] which revealed granulation tissue and dermal necrosis. Inpatient pathology negative osteomyelitis  and negative malignancy of soft tissues sent. Inpatient microbiology reveals staph aureus, Proteus mirabilis and enterococcus faecalis, Actinomyces, and Finegoldia magna. Continue with tetracycline 500 mg twice a day and Ampicillin 500 mg every 6 hours; refill placed. We'll plan to continue antibiotics until wound site is improved likely for an additional 2 more weeks -Debrided right third toenail for patient as protective measures, so that nail will not be snagged with dressing changes or further injured or damaged -Continue with limited weightbearing for transfers only with post op shoe and with the assistance of a rolling walker. No excessive walking or standing.  -Patient to return to office in 1 week or sooner if problems or issues arise. Asencion Islam, DPM

## 2015-09-22 DIAGNOSIS — Z79891 Long term (current) use of opiate analgesic: Secondary | ICD-10-CM | POA: Diagnosis not present

## 2015-09-22 DIAGNOSIS — L97519 Non-pressure chronic ulcer of other part of right foot with unspecified severity: Secondary | ICD-10-CM | POA: Diagnosis not present

## 2015-09-22 DIAGNOSIS — I129 Hypertensive chronic kidney disease with stage 1 through stage 4 chronic kidney disease, or unspecified chronic kidney disease: Secondary | ICD-10-CM | POA: Diagnosis not present

## 2015-09-22 DIAGNOSIS — Z792 Long term (current) use of antibiotics: Secondary | ICD-10-CM | POA: Diagnosis not present

## 2015-09-22 DIAGNOSIS — I350 Nonrheumatic aortic (valve) stenosis: Secondary | ICD-10-CM | POA: Diagnosis not present

## 2015-09-22 DIAGNOSIS — E1159 Type 2 diabetes mellitus with other circulatory complications: Secondary | ICD-10-CM | POA: Diagnosis not present

## 2015-09-22 DIAGNOSIS — Z7984 Long term (current) use of oral hypoglycemic drugs: Secondary | ICD-10-CM | POA: Diagnosis not present

## 2015-09-22 DIAGNOSIS — M1990 Unspecified osteoarthritis, unspecified site: Secondary | ICD-10-CM | POA: Diagnosis not present

## 2015-09-22 DIAGNOSIS — L03115 Cellulitis of right lower limb: Secondary | ICD-10-CM | POA: Diagnosis not present

## 2015-09-22 DIAGNOSIS — E11621 Type 2 diabetes mellitus with foot ulcer: Secondary | ICD-10-CM | POA: Diagnosis not present

## 2015-09-22 DIAGNOSIS — N181 Chronic kidney disease, stage 1: Secondary | ICD-10-CM | POA: Diagnosis not present

## 2015-09-22 DIAGNOSIS — D649 Anemia, unspecified: Secondary | ICD-10-CM | POA: Diagnosis not present

## 2015-09-22 DIAGNOSIS — E1140 Type 2 diabetes mellitus with diabetic neuropathy, unspecified: Secondary | ICD-10-CM | POA: Diagnosis not present

## 2015-09-22 NOTE — Telephone Encounter (Addendum)
-----   Message from [place], [place] sent at 09/21/2015  8:20 PM EDT ----- Regarding: Update to home Nursing Wound Care Orders [facility] Nursing: Wound care orders Cleanse right foot ulceration with antimicrobial wound wash, then pat dry well with 4x4 and then pack ulcer site with aquacel AG Hydrofiber with silver or medline Maxon extra AG silver alginate flat rope dressing, covered with 4x4, abd, kerlix, and Ace daily.  Thanks Dr. Marylene Land.  09/22/2015-Orders 09/21/2015 faxed to [facility].  09/25/2015-Te[facility] states orders were received on one sheet of paper and needed clarification. Rosey Bath states pt said she would be needing some type of powder and if so Dr. Marylene Land would need to order.  09/26/2015-I spoke with Lupita Leash [facility] to see if there was paper work I needed to complete other than the faxed copy of Dr. [name] 09/21/2015 orders sent on 09/22/2015.  Lupita Leash stated that the faxed sheet of 09/22/2015 was clear and she would ask Rosey Bath if she needed any other explanation.

## 2015-09-22 NOTE — Telephone Encounter (Deleted)
-----   Message from [place], [place] sent at 09/21/2015  8:20 PM EDT ----- Regarding: Update to home Nursing Wound Care Orders [facility] Nursing: Wound care orders Cleanse right foot ulceration with antimicrobial wound wash, then pat dry well with 4x4 and then pack ulcer site with aquacel AG Hydrofiber with silver or medline Maxon extra AG silver alginate flat rope dressing, covered with 4x4, abd, kerlix, and Ace daily.  Thanks Dr. Marylene Land

## 2015-09-23 DIAGNOSIS — Z792 Long term (current) use of antibiotics: Secondary | ICD-10-CM | POA: Diagnosis not present

## 2015-09-23 DIAGNOSIS — E11621 Type 2 diabetes mellitus with foot ulcer: Secondary | ICD-10-CM | POA: Diagnosis not present

## 2015-09-23 DIAGNOSIS — M1990 Unspecified osteoarthritis, unspecified site: Secondary | ICD-10-CM | POA: Diagnosis not present

## 2015-09-23 DIAGNOSIS — L97519 Non-pressure chronic ulcer of other part of right foot with unspecified severity: Secondary | ICD-10-CM | POA: Diagnosis not present

## 2015-09-23 DIAGNOSIS — I350 Nonrheumatic aortic (valve) stenosis: Secondary | ICD-10-CM | POA: Diagnosis not present

## 2015-09-23 DIAGNOSIS — L03115 Cellulitis of right lower limb: Secondary | ICD-10-CM | POA: Diagnosis not present

## 2015-09-23 DIAGNOSIS — N181 Chronic kidney disease, stage 1: Secondary | ICD-10-CM | POA: Diagnosis not present

## 2015-09-23 DIAGNOSIS — E1140 Type 2 diabetes mellitus with diabetic neuropathy, unspecified: Secondary | ICD-10-CM | POA: Diagnosis not present

## 2015-09-23 DIAGNOSIS — I129 Hypertensive chronic kidney disease with stage 1 through stage 4 chronic kidney disease, or unspecified chronic kidney disease: Secondary | ICD-10-CM | POA: Diagnosis not present

## 2015-09-23 DIAGNOSIS — D649 Anemia, unspecified: Secondary | ICD-10-CM | POA: Diagnosis not present

## 2015-09-23 DIAGNOSIS — Z79891 Long term (current) use of opiate analgesic: Secondary | ICD-10-CM | POA: Diagnosis not present

## 2015-09-23 DIAGNOSIS — E1159 Type 2 diabetes mellitus with other circulatory complications: Secondary | ICD-10-CM | POA: Diagnosis not present

## 2015-09-23 DIAGNOSIS — Z7984 Long term (current) use of oral hypoglycemic drugs: Secondary | ICD-10-CM | POA: Diagnosis not present

## 2015-09-24 DIAGNOSIS — E1159 Type 2 diabetes mellitus with other circulatory complications: Secondary | ICD-10-CM | POA: Diagnosis not present

## 2015-09-24 DIAGNOSIS — E1140 Type 2 diabetes mellitus with diabetic neuropathy, unspecified: Secondary | ICD-10-CM | POA: Diagnosis not present

## 2015-09-24 DIAGNOSIS — Z7984 Long term (current) use of oral hypoglycemic drugs: Secondary | ICD-10-CM | POA: Diagnosis not present

## 2015-09-24 DIAGNOSIS — L97519 Non-pressure chronic ulcer of other part of right foot with unspecified severity: Secondary | ICD-10-CM | POA: Diagnosis not present

## 2015-09-24 DIAGNOSIS — I129 Hypertensive chronic kidney disease with stage 1 through stage 4 chronic kidney disease, or unspecified chronic kidney disease: Secondary | ICD-10-CM | POA: Diagnosis not present

## 2015-09-24 DIAGNOSIS — E11621 Type 2 diabetes mellitus with foot ulcer: Secondary | ICD-10-CM | POA: Diagnosis not present

## 2015-09-24 DIAGNOSIS — D649 Anemia, unspecified: Secondary | ICD-10-CM | POA: Diagnosis not present

## 2015-09-24 DIAGNOSIS — I350 Nonrheumatic aortic (valve) stenosis: Secondary | ICD-10-CM | POA: Diagnosis not present

## 2015-09-24 DIAGNOSIS — L03115 Cellulitis of right lower limb: Secondary | ICD-10-CM | POA: Diagnosis not present

## 2015-09-24 DIAGNOSIS — M1990 Unspecified osteoarthritis, unspecified site: Secondary | ICD-10-CM | POA: Diagnosis not present

## 2015-09-24 DIAGNOSIS — N181 Chronic kidney disease, stage 1: Secondary | ICD-10-CM | POA: Diagnosis not present

## 2015-09-24 DIAGNOSIS — Z79891 Long term (current) use of opiate analgesic: Secondary | ICD-10-CM | POA: Diagnosis not present

## 2015-09-24 DIAGNOSIS — Z792 Long term (current) use of antibiotics: Secondary | ICD-10-CM | POA: Diagnosis not present

## 2015-09-25 DIAGNOSIS — E11621 Type 2 diabetes mellitus with foot ulcer: Secondary | ICD-10-CM | POA: Diagnosis not present

## 2015-09-25 DIAGNOSIS — Z79891 Long term (current) use of opiate analgesic: Secondary | ICD-10-CM | POA: Diagnosis not present

## 2015-09-25 DIAGNOSIS — L97519 Non-pressure chronic ulcer of other part of right foot with unspecified severity: Secondary | ICD-10-CM | POA: Diagnosis not present

## 2015-09-25 DIAGNOSIS — M1990 Unspecified osteoarthritis, unspecified site: Secondary | ICD-10-CM | POA: Diagnosis not present

## 2015-09-25 DIAGNOSIS — Z792 Long term (current) use of antibiotics: Secondary | ICD-10-CM | POA: Diagnosis not present

## 2015-09-25 DIAGNOSIS — D649 Anemia, unspecified: Secondary | ICD-10-CM | POA: Diagnosis not present

## 2015-09-25 DIAGNOSIS — L03115 Cellulitis of right lower limb: Secondary | ICD-10-CM | POA: Diagnosis not present

## 2015-09-25 DIAGNOSIS — Z7984 Long term (current) use of oral hypoglycemic drugs: Secondary | ICD-10-CM | POA: Diagnosis not present

## 2015-09-25 DIAGNOSIS — I350 Nonrheumatic aortic (valve) stenosis: Secondary | ICD-10-CM | POA: Diagnosis not present

## 2015-09-25 DIAGNOSIS — E1159 Type 2 diabetes mellitus with other circulatory complications: Secondary | ICD-10-CM | POA: Diagnosis not present

## 2015-09-25 DIAGNOSIS — I129 Hypertensive chronic kidney disease with stage 1 through stage 4 chronic kidney disease, or unspecified chronic kidney disease: Secondary | ICD-10-CM | POA: Diagnosis not present

## 2015-09-25 DIAGNOSIS — N181 Chronic kidney disease, stage 1: Secondary | ICD-10-CM | POA: Diagnosis not present

## 2015-09-25 DIAGNOSIS — E1140 Type 2 diabetes mellitus with diabetic neuropathy, unspecified: Secondary | ICD-10-CM | POA: Diagnosis not present

## 2015-09-26 DIAGNOSIS — I129 Hypertensive chronic kidney disease with stage 1 through stage 4 chronic kidney disease, or unspecified chronic kidney disease: Secondary | ICD-10-CM | POA: Diagnosis not present

## 2015-09-26 DIAGNOSIS — I350 Nonrheumatic aortic (valve) stenosis: Secondary | ICD-10-CM | POA: Diagnosis not present

## 2015-09-26 DIAGNOSIS — Z792 Long term (current) use of antibiotics: Secondary | ICD-10-CM | POA: Diagnosis not present

## 2015-09-26 DIAGNOSIS — N181 Chronic kidney disease, stage 1: Secondary | ICD-10-CM | POA: Diagnosis not present

## 2015-09-26 DIAGNOSIS — Z7984 Long term (current) use of oral hypoglycemic drugs: Secondary | ICD-10-CM | POA: Diagnosis not present

## 2015-09-26 DIAGNOSIS — M1990 Unspecified osteoarthritis, unspecified site: Secondary | ICD-10-CM | POA: Diagnosis not present

## 2015-09-26 DIAGNOSIS — L97519 Non-pressure chronic ulcer of other part of right foot with unspecified severity: Secondary | ICD-10-CM | POA: Diagnosis not present

## 2015-09-26 DIAGNOSIS — E1140 Type 2 diabetes mellitus with diabetic neuropathy, unspecified: Secondary | ICD-10-CM | POA: Diagnosis not present

## 2015-09-26 DIAGNOSIS — Z79891 Long term (current) use of opiate analgesic: Secondary | ICD-10-CM | POA: Diagnosis not present

## 2015-09-26 DIAGNOSIS — D649 Anemia, unspecified: Secondary | ICD-10-CM | POA: Diagnosis not present

## 2015-09-26 DIAGNOSIS — E1159 Type 2 diabetes mellitus with other circulatory complications: Secondary | ICD-10-CM | POA: Diagnosis not present

## 2015-09-26 DIAGNOSIS — L03115 Cellulitis of right lower limb: Secondary | ICD-10-CM | POA: Diagnosis not present

## 2015-09-26 DIAGNOSIS — E11621 Type 2 diabetes mellitus with foot ulcer: Secondary | ICD-10-CM | POA: Diagnosis not present

## 2015-09-26 NOTE — Telephone Encounter (Signed)
Recommend microbial wound wash if they are not able to get that type of wash then will require an Rx for bacitracin powder to mix with saline to cleanse the ulceration site Thanks Dr Marylene Land

## 2015-09-27 DIAGNOSIS — E1159 Type 2 diabetes mellitus with other circulatory complications: Secondary | ICD-10-CM | POA: Diagnosis not present

## 2015-09-27 DIAGNOSIS — Z7984 Long term (current) use of oral hypoglycemic drugs: Secondary | ICD-10-CM | POA: Diagnosis not present

## 2015-09-27 DIAGNOSIS — Z79891 Long term (current) use of opiate analgesic: Secondary | ICD-10-CM | POA: Diagnosis not present

## 2015-09-27 DIAGNOSIS — L03115 Cellulitis of right lower limb: Secondary | ICD-10-CM | POA: Diagnosis not present

## 2015-09-27 DIAGNOSIS — E1140 Type 2 diabetes mellitus with diabetic neuropathy, unspecified: Secondary | ICD-10-CM | POA: Diagnosis not present

## 2015-09-27 DIAGNOSIS — D649 Anemia, unspecified: Secondary | ICD-10-CM | POA: Diagnosis not present

## 2015-09-27 DIAGNOSIS — Z792 Long term (current) use of antibiotics: Secondary | ICD-10-CM | POA: Diagnosis not present

## 2015-09-27 DIAGNOSIS — N181 Chronic kidney disease, stage 1: Secondary | ICD-10-CM | POA: Diagnosis not present

## 2015-09-27 DIAGNOSIS — M1990 Unspecified osteoarthritis, unspecified site: Secondary | ICD-10-CM | POA: Diagnosis not present

## 2015-09-27 DIAGNOSIS — I350 Nonrheumatic aortic (valve) stenosis: Secondary | ICD-10-CM | POA: Diagnosis not present

## 2015-09-27 DIAGNOSIS — E11621 Type 2 diabetes mellitus with foot ulcer: Secondary | ICD-10-CM | POA: Diagnosis not present

## 2015-09-27 DIAGNOSIS — I129 Hypertensive chronic kidney disease with stage 1 through stage 4 chronic kidney disease, or unspecified chronic kidney disease: Secondary | ICD-10-CM | POA: Diagnosis not present

## 2015-09-27 DIAGNOSIS — L97519 Non-pressure chronic ulcer of other part of right foot with unspecified severity: Secondary | ICD-10-CM | POA: Diagnosis not present

## 2015-09-28 ENCOUNTER — Encounter: Payer: Self-pay | Admitting: Sports Medicine

## 2015-09-28 ENCOUNTER — Ambulatory Visit (INDEPENDENT_AMBULATORY_CARE_PROVIDER_SITE_OTHER): Payer: Medicare Other | Admitting: Sports Medicine

## 2015-09-28 DIAGNOSIS — M79671 Pain in right foot: Secondary | ICD-10-CM

## 2015-09-28 DIAGNOSIS — L03031 Cellulitis of right toe: Secondary | ICD-10-CM

## 2015-09-28 DIAGNOSIS — M2141 Flat foot [pes planus] (acquired), right foot: Secondary | ICD-10-CM

## 2015-09-28 DIAGNOSIS — L97513 Non-pressure chronic ulcer of other part of right foot with necrosis of muscle: Secondary | ICD-10-CM

## 2015-09-28 DIAGNOSIS — Z9889 Other specified postprocedural states: Secondary | ICD-10-CM

## 2015-09-28 DIAGNOSIS — M2142 Flat foot [pes planus] (acquired), left foot: Secondary | ICD-10-CM

## 2015-09-28 DIAGNOSIS — L02611 Cutaneous abscess of right foot: Secondary | ICD-10-CM

## 2015-09-28 DIAGNOSIS — E1142 Type 2 diabetes mellitus with diabetic polyneuropathy: Secondary | ICD-10-CM

## 2015-09-28 NOTE — Progress Notes (Signed)
Patient ID: Emily Miranda, female   DOB: Oct 07, 1940, 75 y.o.   MRN: [id number]   Subjective: Emily Miranda is a 75 y.o. female patient seen in office for follow up evaluation of ulceration on right foot, POV #2 s/p incision and drainage with bone and tissue culture and hospital admission on 09/08/2015 patient reports that she has daily home nursing has been doing okay at home. Denies any other issues at this time. Denies nausea/fever/vomiting/chills/night sweats/shortness of breath/pain.   Reports fasting blood sugar has been good.   Patient Active Problem List   Diagnosis Date Noted  . Abdominal pain, RLQ (right lower quadrant) -chronic since 2009 06/02/2013  . Constipation, chronic 06/02/2013  . SUI (stress urinary incontinence, female) 06/02/2013  . Recurrent UTI (urinary tract infection) 06/02/2013  . HTN (hypertension)   . CHEST PAIN 11/27/2009  . OVERWEIGHT 11/28/2008  . ESSENTIAL HYPERTENSION, BENIGN 11/28/2008  . DM 11/25/2008  . AORTIC STENOSIS 11/25/2008  . PANCREATITIS, HX OF 11/25/2008   Current Outpatient Prescriptions on File Prior to Visit  Medication Sig Dispense Refill  . amLODipine (NORVASC) 5 MG tablet Take 5 mg by mouth daily.     Marland Kitchen ampicillin (PRINCIPEN) 500 MG capsule Take 1 capsule (500 mg total) by mouth 4 (four) times daily. 56 capsule 0  . Ascorbic Acid (VITAMIN C) 100 MG tablet Take 100 mg by mouth daily.      . cephALEXin (KEFLEX) 500 MG capsule Take 1 capsule (500 mg total) by mouth 2 (two) times daily. 28 capsule 0  . chlorthalidone (HYGROTON) 25 MG tablet Take 0.5 tablets (12.5 mg total) by mouth daily. 15 tablet 11  . furosemide (LASIX) 20 MG tablet Take 20 mg by mouth daily.     Marland Kitchen glimepiride (AMARYL) 4 MG tablet Take 4 mg by mouth daily with breakfast.    . HYDROcodone-acetaminophen (NORCO) 5-325 MG per tablet Take 1 tablet by mouth every 6 (six) hours as needed. 10 tablet 0  . lisinopril (PRINIVIL,ZESTRIL) 10 MG tablet Take 10 mg by mouth daily.     . methocarbamol (ROBAXIN) 500 MG tablet Take 1.5 tablets (750 mg total) by mouth every 8 (eight) hours as needed for muscle spasms (use for muscle cramps/pain). 30 tablet 2  . naproxen (NAPROSYN) 500 MG tablet Take 1 tablet (500 mg total) by mouth 2 (two) times daily with a meal. 40 tablet 1  . nitrofurantoin, macrocrystal-monohydrate, (MACROBID) 100 MG capsule Take 100 mg by mouth 2 (two) times daily.     . ONE TOUCH ULTRA TEST test strip 1 each by Other route once a week.     Marland Kitchen Phenyleph-Doxylamine-DM-APAP (ALKA SELTZER PLUS PO) Take 1 tablet by mouth daily as needed (congestion).    . pregabalin (LYRICA) 75 MG capsule Take 1 capsule (75 mg total) by mouth 2 (two) times daily. 60 capsule 1  . sulfamethoxazole-trimethoprim (BACTRIM DS,SEPTRA DS) 800-160 MG tablet Take 1 tablet by mouth 2 (two) times daily. 20 tablet 0  . tetracycline (ACHROMYCIN,SUMYCIN) 500 MG capsule Take 1 capsule (500 mg total) by mouth 2 (two) times daily. 28 capsule 0  . TRADJENTA 5 MG TABS tablet Take 5 mg by mouth daily.     . traMADol (ULTRAM) 50 MG tablet TAKE 1 TABLET EVERY 8 HOURS AS NEEDED 30 tablet 0   Current Facility-Administered Medications on File Prior to Visit  Medication Dose Route Frequency Provider Last Rate Last Dose  . triamcinolone acetonide (KENALOG) 10 MG/ML injection 10 mg  10 mg Other  Once Asencion Islam, DPM       Allergies  Allergen Reactions  . Levofloxacin Nausea And Vomiting    Other reaction(s): Confusion    Recent Results (from the past 2160 hour(s))  Wound culture     Status: Abnormal   Collection Time: 09/07/15  4:03 PM  Result Value Ref Range   Gram Stain Result Final report    Result 1 Comment     Comment: No white blood cells seen.   RESULT 2 Comment     Comment: Few gram negative rods.   Aerobic Bacterial Culture Final report (A)    Result 1 Proteus mirabilis (A)     Comment: Heavy growth   Result 2 Staphylococcus aureus (A)     Comment: Moderate growth Based on  resistance to penicillin and susceptibility to oxacillin this isolate would be susceptible to: * Penicillinase-stable penicillins; such as:     Cloxacillin     Dicloxacillin     Nafcillin * Beta-lactam/beta-lactamase inhibitor combinations; such as:     Amoxicillin-clavulanic acid     Ampicillin-sulbactam * Antistaphylococcal cephems; such as:     Cefaclor     Cefuroxime * Antistaphylococcal carbapenems; such as:     Imipenem     Meropenem    ANTIMICROBIAL SUSCEPTIBILITY Comment     Comment:       ** S = Susceptible; I = Intermediate; R = Resistant **                    P = Positive; N = Negative             MICS are expressed in micrograms per mL    Antibiotic                 RSLT#1    RSLT#2    RSLT#3    RSLT#4 Amoxicillin/Clavulanic Acid    S Ampicillin                     S Cefepime                       S Ceftriaxone                    S Cefuroxime                     S Ciprofloxacin                  S         S Clindamycin                              S Ertapenem                      S Erythromycin                             S Gentamicin                     S         S Levofloxacin                   S         S Linezolid  S Moxifloxacin                             S Oxacillin                                S Penicillin                               R Piperacillin                   S Quinupristin/Dalfopristin                S Rifampin                                 S Tetracycline                   R         S Tobr amycin                     S Trimethoprim/Sulfa             R         S Vancomycin                               S     Objective: There were no vitals filed for this visit.  General: Patient is awake, alert, oriented x 3 and in no acute distress.  Dermatology: Skin is warm and dry bilateral with a full thickness ulceration down to the level of muscle measuring 10 cm in length by approximately 4 cm in width at the  widest point extending from the plantar medial midfoot all the way to the hallux, status post incision and drainage right foot with moderate granular buds and coverage of underlying bone noted, there is rolled borders at the wound margins with 1+ pitting edema, no warmth, no erythema, no cellulitis, no malodor, no active drainage, however, there is mild serous drainage noted on the inner dressing layers. There is dry blood to be right third toenail with no acute signs of infection unchanged from prior.  Vascular: Dorsalis Pedis pulse = 2/4 Bilateral,  Posterior Tibial pulse = 1/4 Bilateral,  Capillary Fill Time < 5 seconds  Neurologic: Epicritic sensation diminished  to the level of ankle using the 5.07/10g [employer].  Musculosketal:  There is no pain with palpation to the ulceration site on right foot, pes planus foot type with significant talonavicular bulge, right greater than left foot with residual chronic none active Charcot. No pain with compression to calves bilateral.  Strength acceptable for patient status.   Assessment and Plan:  Problem List Items Addressed This Visit    None    Visit Diagnoses    S/P foot surgery, right    -  Primary    Right foot ulcer, with necrosis of muscle (HCC)        Right foot pain        Cellulitis and abscess of toe, right        Improving    Pes planus of both feet        Diabetic polyneuropathy associated with  type 2 diabetes mellitus (HCC)          -Examined patient and discussed the progression of the wound and treatment alternatives. -Cleansed ulceration with antimicrobial spray and then treated hypergranular areas with silver nitrate and packed open with dry gauze and applied dry sterile dressing secured with Ace wrap. Daily Nursing orders continue with antimicrobral wound wash and aquacell AG hydrofiber with silver. Encouraged patient to inform nursing staff of the need to dry sites well before re-dressing -Continue with  tetracycline 500 mg twice a day and Ampicillin 500 mg every 6 hours; We'll plan to continue antibiotics until wound site is improved likely for an additional 1-2 weeks. -Discussed with patient will consider repeat cultures to see if infection is resolved and will consider misonix debridement and delayed primary closure -Continue with limited weightbearing for transfers only with post op shoe and with the assistance of a rolling walker. No excessive walking or standing.  -Patient to return to office in 1 week or sooner if problems or issues arise.  Asencion Islam, DPM

## 2015-09-29 ENCOUNTER — Other Ambulatory Visit: Payer: Self-pay | Admitting: Sports Medicine

## 2015-09-29 DIAGNOSIS — L97519 Non-pressure chronic ulcer of other part of right foot with unspecified severity: Secondary | ICD-10-CM | POA: Diagnosis not present

## 2015-09-29 DIAGNOSIS — I350 Nonrheumatic aortic (valve) stenosis: Secondary | ICD-10-CM | POA: Diagnosis not present

## 2015-09-29 DIAGNOSIS — Z79891 Long term (current) use of opiate analgesic: Secondary | ICD-10-CM | POA: Diagnosis not present

## 2015-09-29 DIAGNOSIS — Z792 Long term (current) use of antibiotics: Secondary | ICD-10-CM | POA: Diagnosis not present

## 2015-09-29 DIAGNOSIS — E1140 Type 2 diabetes mellitus with diabetic neuropathy, unspecified: Secondary | ICD-10-CM | POA: Diagnosis not present

## 2015-09-29 DIAGNOSIS — E1159 Type 2 diabetes mellitus with other circulatory complications: Secondary | ICD-10-CM | POA: Diagnosis not present

## 2015-09-29 DIAGNOSIS — L03115 Cellulitis of right lower limb: Secondary | ICD-10-CM | POA: Diagnosis not present

## 2015-09-29 DIAGNOSIS — Z7984 Long term (current) use of oral hypoglycemic drugs: Secondary | ICD-10-CM | POA: Diagnosis not present

## 2015-09-29 DIAGNOSIS — E11621 Type 2 diabetes mellitus with foot ulcer: Secondary | ICD-10-CM | POA: Diagnosis not present

## 2015-09-29 DIAGNOSIS — I129 Hypertensive chronic kidney disease with stage 1 through stage 4 chronic kidney disease, or unspecified chronic kidney disease: Secondary | ICD-10-CM | POA: Diagnosis not present

## 2015-09-29 DIAGNOSIS — D649 Anemia, unspecified: Secondary | ICD-10-CM | POA: Diagnosis not present

## 2015-09-29 DIAGNOSIS — N181 Chronic kidney disease, stage 1: Secondary | ICD-10-CM | POA: Diagnosis not present

## 2015-09-29 DIAGNOSIS — M1990 Unspecified osteoarthritis, unspecified site: Secondary | ICD-10-CM | POA: Diagnosis not present

## 2015-09-30 DIAGNOSIS — N181 Chronic kidney disease, stage 1: Secondary | ICD-10-CM | POA: Diagnosis not present

## 2015-09-30 DIAGNOSIS — Z7984 Long term (current) use of oral hypoglycemic drugs: Secondary | ICD-10-CM | POA: Diagnosis not present

## 2015-09-30 DIAGNOSIS — L97519 Non-pressure chronic ulcer of other part of right foot with unspecified severity: Secondary | ICD-10-CM | POA: Diagnosis not present

## 2015-09-30 DIAGNOSIS — L03115 Cellulitis of right lower limb: Secondary | ICD-10-CM | POA: Diagnosis not present

## 2015-09-30 DIAGNOSIS — D649 Anemia, unspecified: Secondary | ICD-10-CM | POA: Diagnosis not present

## 2015-09-30 DIAGNOSIS — Z792 Long term (current) use of antibiotics: Secondary | ICD-10-CM | POA: Diagnosis not present

## 2015-09-30 DIAGNOSIS — M1990 Unspecified osteoarthritis, unspecified site: Secondary | ICD-10-CM | POA: Diagnosis not present

## 2015-09-30 DIAGNOSIS — Z79891 Long term (current) use of opiate analgesic: Secondary | ICD-10-CM | POA: Diagnosis not present

## 2015-09-30 DIAGNOSIS — I129 Hypertensive chronic kidney disease with stage 1 through stage 4 chronic kidney disease, or unspecified chronic kidney disease: Secondary | ICD-10-CM | POA: Diagnosis not present

## 2015-09-30 DIAGNOSIS — E11621 Type 2 diabetes mellitus with foot ulcer: Secondary | ICD-10-CM | POA: Diagnosis not present

## 2015-09-30 DIAGNOSIS — I350 Nonrheumatic aortic (valve) stenosis: Secondary | ICD-10-CM | POA: Diagnosis not present

## 2015-09-30 DIAGNOSIS — E1159 Type 2 diabetes mellitus with other circulatory complications: Secondary | ICD-10-CM | POA: Diagnosis not present

## 2015-09-30 DIAGNOSIS — E1140 Type 2 diabetes mellitus with diabetic neuropathy, unspecified: Secondary | ICD-10-CM | POA: Diagnosis not present

## 2015-10-01 DIAGNOSIS — E11621 Type 2 diabetes mellitus with foot ulcer: Secondary | ICD-10-CM | POA: Diagnosis not present

## 2015-10-01 DIAGNOSIS — Z7984 Long term (current) use of oral hypoglycemic drugs: Secondary | ICD-10-CM | POA: Diagnosis not present

## 2015-10-01 DIAGNOSIS — N181 Chronic kidney disease, stage 1: Secondary | ICD-10-CM | POA: Diagnosis not present

## 2015-10-01 DIAGNOSIS — Z792 Long term (current) use of antibiotics: Secondary | ICD-10-CM | POA: Diagnosis not present

## 2015-10-01 DIAGNOSIS — I350 Nonrheumatic aortic (valve) stenosis: Secondary | ICD-10-CM | POA: Diagnosis not present

## 2015-10-01 DIAGNOSIS — M1990 Unspecified osteoarthritis, unspecified site: Secondary | ICD-10-CM | POA: Diagnosis not present

## 2015-10-01 DIAGNOSIS — L97519 Non-pressure chronic ulcer of other part of right foot with unspecified severity: Secondary | ICD-10-CM | POA: Diagnosis not present

## 2015-10-01 DIAGNOSIS — E1140 Type 2 diabetes mellitus with diabetic neuropathy, unspecified: Secondary | ICD-10-CM | POA: Diagnosis not present

## 2015-10-01 DIAGNOSIS — D649 Anemia, unspecified: Secondary | ICD-10-CM | POA: Diagnosis not present

## 2015-10-01 DIAGNOSIS — Z79891 Long term (current) use of opiate analgesic: Secondary | ICD-10-CM | POA: Diagnosis not present

## 2015-10-01 DIAGNOSIS — E1159 Type 2 diabetes mellitus with other circulatory complications: Secondary | ICD-10-CM | POA: Diagnosis not present

## 2015-10-01 DIAGNOSIS — L03115 Cellulitis of right lower limb: Secondary | ICD-10-CM | POA: Diagnosis not present

## 2015-10-01 DIAGNOSIS — I129 Hypertensive chronic kidney disease with stage 1 through stage 4 chronic kidney disease, or unspecified chronic kidney disease: Secondary | ICD-10-CM | POA: Diagnosis not present

## 2015-10-02 DIAGNOSIS — L03115 Cellulitis of right lower limb: Secondary | ICD-10-CM | POA: Diagnosis not present

## 2015-10-02 DIAGNOSIS — M1990 Unspecified osteoarthritis, unspecified site: Secondary | ICD-10-CM | POA: Diagnosis not present

## 2015-10-02 DIAGNOSIS — E1140 Type 2 diabetes mellitus with diabetic neuropathy, unspecified: Secondary | ICD-10-CM | POA: Diagnosis not present

## 2015-10-02 DIAGNOSIS — E11621 Type 2 diabetes mellitus with foot ulcer: Secondary | ICD-10-CM | POA: Diagnosis not present

## 2015-10-02 DIAGNOSIS — N181 Chronic kidney disease, stage 1: Secondary | ICD-10-CM | POA: Diagnosis not present

## 2015-10-02 DIAGNOSIS — I350 Nonrheumatic aortic (valve) stenosis: Secondary | ICD-10-CM | POA: Diagnosis not present

## 2015-10-02 DIAGNOSIS — Z792 Long term (current) use of antibiotics: Secondary | ICD-10-CM | POA: Diagnosis not present

## 2015-10-02 DIAGNOSIS — I129 Hypertensive chronic kidney disease with stage 1 through stage 4 chronic kidney disease, or unspecified chronic kidney disease: Secondary | ICD-10-CM | POA: Diagnosis not present

## 2015-10-02 DIAGNOSIS — D649 Anemia, unspecified: Secondary | ICD-10-CM | POA: Diagnosis not present

## 2015-10-02 DIAGNOSIS — L97519 Non-pressure chronic ulcer of other part of right foot with unspecified severity: Secondary | ICD-10-CM | POA: Diagnosis not present

## 2015-10-02 DIAGNOSIS — Z79891 Long term (current) use of opiate analgesic: Secondary | ICD-10-CM | POA: Diagnosis not present

## 2015-10-02 DIAGNOSIS — Z7984 Long term (current) use of oral hypoglycemic drugs: Secondary | ICD-10-CM | POA: Diagnosis not present

## 2015-10-02 DIAGNOSIS — E1159 Type 2 diabetes mellitus with other circulatory complications: Secondary | ICD-10-CM | POA: Diagnosis not present

## 2015-10-03 DIAGNOSIS — Z79891 Long term (current) use of opiate analgesic: Secondary | ICD-10-CM | POA: Diagnosis not present

## 2015-10-03 DIAGNOSIS — E1140 Type 2 diabetes mellitus with diabetic neuropathy, unspecified: Secondary | ICD-10-CM | POA: Diagnosis not present

## 2015-10-03 DIAGNOSIS — L03115 Cellulitis of right lower limb: Secondary | ICD-10-CM | POA: Diagnosis not present

## 2015-10-03 DIAGNOSIS — E11621 Type 2 diabetes mellitus with foot ulcer: Secondary | ICD-10-CM | POA: Diagnosis not present

## 2015-10-03 DIAGNOSIS — E1159 Type 2 diabetes mellitus with other circulatory complications: Secondary | ICD-10-CM | POA: Diagnosis not present

## 2015-10-03 DIAGNOSIS — D649 Anemia, unspecified: Secondary | ICD-10-CM | POA: Diagnosis not present

## 2015-10-03 DIAGNOSIS — I129 Hypertensive chronic kidney disease with stage 1 through stage 4 chronic kidney disease, or unspecified chronic kidney disease: Secondary | ICD-10-CM | POA: Diagnosis not present

## 2015-10-03 DIAGNOSIS — Z792 Long term (current) use of antibiotics: Secondary | ICD-10-CM | POA: Diagnosis not present

## 2015-10-03 DIAGNOSIS — N181 Chronic kidney disease, stage 1: Secondary | ICD-10-CM | POA: Diagnosis not present

## 2015-10-03 DIAGNOSIS — I350 Nonrheumatic aortic (valve) stenosis: Secondary | ICD-10-CM | POA: Diagnosis not present

## 2015-10-03 DIAGNOSIS — Z7984 Long term (current) use of oral hypoglycemic drugs: Secondary | ICD-10-CM | POA: Diagnosis not present

## 2015-10-03 DIAGNOSIS — L97519 Non-pressure chronic ulcer of other part of right foot with unspecified severity: Secondary | ICD-10-CM | POA: Diagnosis not present

## 2015-10-03 DIAGNOSIS — M1990 Unspecified osteoarthritis, unspecified site: Secondary | ICD-10-CM | POA: Diagnosis not present

## 2015-10-04 DIAGNOSIS — I129 Hypertensive chronic kidney disease with stage 1 through stage 4 chronic kidney disease, or unspecified chronic kidney disease: Secondary | ICD-10-CM | POA: Diagnosis not present

## 2015-10-04 DIAGNOSIS — L97519 Non-pressure chronic ulcer of other part of right foot with unspecified severity: Secondary | ICD-10-CM | POA: Diagnosis not present

## 2015-10-04 DIAGNOSIS — M1990 Unspecified osteoarthritis, unspecified site: Secondary | ICD-10-CM | POA: Diagnosis not present

## 2015-10-04 DIAGNOSIS — E11621 Type 2 diabetes mellitus with foot ulcer: Secondary | ICD-10-CM | POA: Diagnosis not present

## 2015-10-04 DIAGNOSIS — Z792 Long term (current) use of antibiotics: Secondary | ICD-10-CM | POA: Diagnosis not present

## 2015-10-04 DIAGNOSIS — Z7984 Long term (current) use of oral hypoglycemic drugs: Secondary | ICD-10-CM | POA: Diagnosis not present

## 2015-10-04 DIAGNOSIS — E1140 Type 2 diabetes mellitus with diabetic neuropathy, unspecified: Secondary | ICD-10-CM | POA: Diagnosis not present

## 2015-10-04 DIAGNOSIS — Z79891 Long term (current) use of opiate analgesic: Secondary | ICD-10-CM | POA: Diagnosis not present

## 2015-10-04 DIAGNOSIS — E1159 Type 2 diabetes mellitus with other circulatory complications: Secondary | ICD-10-CM | POA: Diagnosis not present

## 2015-10-04 DIAGNOSIS — N181 Chronic kidney disease, stage 1: Secondary | ICD-10-CM | POA: Diagnosis not present

## 2015-10-04 DIAGNOSIS — L03115 Cellulitis of right lower limb: Secondary | ICD-10-CM | POA: Diagnosis not present

## 2015-10-04 DIAGNOSIS — D649 Anemia, unspecified: Secondary | ICD-10-CM | POA: Diagnosis not present

## 2015-10-04 DIAGNOSIS — I350 Nonrheumatic aortic (valve) stenosis: Secondary | ICD-10-CM | POA: Diagnosis not present

## 2015-10-05 ENCOUNTER — Encounter: Payer: Self-pay | Admitting: Sports Medicine

## 2015-10-05 ENCOUNTER — Ambulatory Visit (INDEPENDENT_AMBULATORY_CARE_PROVIDER_SITE_OTHER): Payer: Medicare Other | Admitting: Sports Medicine

## 2015-10-05 DIAGNOSIS — E1142 Type 2 diabetes mellitus with diabetic polyneuropathy: Secondary | ICD-10-CM

## 2015-10-05 DIAGNOSIS — M79671 Pain in right foot: Secondary | ICD-10-CM

## 2015-10-05 DIAGNOSIS — M2142 Flat foot [pes planus] (acquired), left foot: Secondary | ICD-10-CM

## 2015-10-05 DIAGNOSIS — L97513 Non-pressure chronic ulcer of other part of right foot with necrosis of muscle: Secondary | ICD-10-CM | POA: Diagnosis not present

## 2015-10-05 DIAGNOSIS — M2141 Flat foot [pes planus] (acquired), right foot: Secondary | ICD-10-CM

## 2015-10-05 DIAGNOSIS — E11621 Type 2 diabetes mellitus with foot ulcer: Secondary | ICD-10-CM | POA: Diagnosis not present

## 2015-10-05 DIAGNOSIS — L97511 Non-pressure chronic ulcer of other part of right foot limited to breakdown of skin: Secondary | ICD-10-CM | POA: Diagnosis not present

## 2015-10-05 DIAGNOSIS — Z9889 Other specified postprocedural states: Secondary | ICD-10-CM

## 2015-10-05 NOTE — Progress Notes (Signed)
Patient ID: Emily Miranda, female   DOB: June 24, 1940, 75 y.o.   MRN: [id number]  Subjective: Emily Miranda is a 75 y.o. female patient seen in office for follow up evaluation of ulceration on right foot, POV #3 s/p incision and drainage with bone and tissue culture and hospital admission on 09/08/2015 patient has daily home nursing changing dressings. Reports that she is having some nausea with taking antibiotics along with stomach has been on yogurt and drinking pickle juice with improvement. Denies any other issues at this time.  Reports fasting blood sugar has been good.   Patient Active Problem List   Diagnosis Date Noted  . Abdominal pain, RLQ (right lower quadrant) -chronic since 2009 06/02/2013  . Constipation, chronic 06/02/2013  . SUI (stress urinary incontinence, female) 06/02/2013  . Recurrent UTI (urinary tract infection) 06/02/2013  . HTN (hypertension)   . CHEST PAIN 11/27/2009  . OVERWEIGHT 11/28/2008  . ESSENTIAL HYPERTENSION, BENIGN 11/28/2008  . DM 11/25/2008  . AORTIC STENOSIS 11/25/2008  . PANCREATITIS, HX OF 11/25/2008   Current Outpatient Prescriptions on File Prior to Visit  Medication Sig Dispense Refill  . amLODipine (NORVASC) 5 MG tablet Take 5 mg by mouth daily.     Marland Kitchen ampicillin (PRINCIPEN) 500 MG capsule Take 1 capsule (500 mg total) by mouth 4 (four) times daily. 56 capsule 0  . Ascorbic Acid (VITAMIN C) 100 MG tablet Take 100 mg by mouth daily.      . cephALEXin (KEFLEX) 500 MG capsule Take 1 capsule (500 mg total) by mouth 2 (two) times daily. 28 capsule 0  . chlorthalidone (HYGROTON) 25 MG tablet Take 0.5 tablets (12.5 mg total) by mouth daily. 15 tablet 11  . furosemide (LASIX) 20 MG tablet Take 20 mg by mouth daily.     Marland Kitchen glimepiride (AMARYL) 4 MG tablet Take 4 mg by mouth daily with breakfast.    . HYDROcodone-acetaminophen (NORCO) 5-325 MG per tablet Take 1 tablet by mouth every 6 (six) hours as needed. 10 tablet 0  . lisinopril  (PRINIVIL,ZESTRIL) 10 MG tablet Take 10 mg by mouth daily.    . methocarbamol (ROBAXIN) 500 MG tablet Take 1.5 tablets (750 mg total) by mouth every 8 (eight) hours as needed for muscle spasms (use for muscle cramps/pain). 30 tablet 2  . naproxen (NAPROSYN) 500 MG tablet Take 1 tablet (500 mg total) by mouth 2 (two) times daily with a meal. 40 tablet 1  . nitrofurantoin, macrocrystal-monohydrate, (MACROBID) 100 MG capsule Take 100 mg by mouth 2 (two) times daily.     . ONE TOUCH ULTRA TEST test strip 1 each by Other route once a week.     Marland Kitchen Phenyleph-Doxylamine-DM-APAP (ALKA SELTZER PLUS PO) Take 1 tablet by mouth daily as needed (congestion).    . pregabalin (LYRICA) 75 MG capsule Take 1 capsule (75 mg total) by mouth 2 (two) times daily. 60 capsule 1  . sulfamethoxazole-trimethoprim (BACTRIM DS,SEPTRA DS) 800-160 MG tablet Take 1 tablet by mouth 2 (two) times daily. 20 tablet 0  . tetracycline (ACHROMYCIN,SUMYCIN) 500 MG capsule Take 1 capsule (500 mg total) by mouth 2 (two) times daily. 28 capsule 0  . TRADJENTA 5 MG TABS tablet Take 5 mg by mouth daily.     . traMADol (ULTRAM) 50 MG tablet TAKE 1 TABLET BY MOUTH EVERY 8 HOURS AS NEEDED FOR FOOT PAIN 30 tablet 0   Current Facility-Administered Medications on File Prior to Visit  Medication Dose Route Frequency Provider Last Rate Last Dose  .  triamcinolone acetonide (KENALOG) 10 MG/ML injection 10 mg  10 mg Other Once Asencion Islam, DPM       Allergies  Allergen Reactions  . Levofloxacin Nausea And Vomiting    Other reaction(s): Confusion    Recent Results (from the past 2160 hour(s))  Wound culture     Status: Abnormal   Collection Time: 09/07/15  4:03 PM  Result Value Ref Range   Gram Stain Result Final report    Result 1 Comment     Comment: No white blood cells seen.   RESULT 2 Comment     Comment: Few gram negative rods.   Aerobic Bacterial Culture Final report (A)    Result 1 Proteus mirabilis (A)     Comment: Heavy growth    Result 2 Staphylococcus aureus (A)     Comment: Moderate growth Based on resistance to penicillin and susceptibility to oxacillin this isolate would be susceptible to: * Penicillinase-stable penicillins; such as:     Cloxacillin     Dicloxacillin     Nafcillin * Beta-lactam/beta-lactamase inhibitor combinations; such as:     Amoxicillin-clavulanic acid     Ampicillin-sulbactam * Antistaphylococcal cephems; such as:     Cefaclor     Cefuroxime * Antistaphylococcal carbapenems; such as:     Imipenem     Meropenem    ANTIMICROBIAL SUSCEPTIBILITY Comment     Comment:       ** S = Susceptible; I = Intermediate; R = Resistant **                    P = Positive; N = Negative             MICS are expressed in micrograms per mL    Antibiotic                 RSLT#1    RSLT#2    RSLT#3    RSLT#4 Amoxicillin/Clavulanic Acid    S Ampicillin                     S Cefepime                       S Ceftriaxone                    S Cefuroxime                     S Ciprofloxacin                  S         S Clindamycin                              S Ertapenem                      S Erythromycin                             S Gentamicin                     S         S Levofloxacin                   S         S Linezolid  S Moxifloxacin                             S Oxacillin                                S Penicillin                               R Piperacillin                   S Quinupristin/Dalfopristin                S Rifampin                                 S Tetracycline                   R         S Tobr amycin                     S Trimethoprim/Sulfa             R         S Vancomycin                               S     Objective: There were no vitals filed for this visit.  General: Patient is awake, alert, oriented x 3 and in no acute distress.  Dermatology: Skin is warm and dry bilateral with a full thickness ulceration down to the level  of muscle measuring 9 cm in length by approximately 4 cm in width at the widest point extending from the plantar medial midfoot all the way to the hallux, status post incision and drainage right foot with moderate granular buds and coverage of underlying bone noted, there is rolled borders at the wound margins with 1+ pitting edema, no warmth, no erythema, no cellulitis, no malodor, no active drainage, however, there is mild serous drainage noted on the inner dressing layers. There is dry blood to be right third toenail with no acute signs of infection unchanged from prior.  Vascular: Dorsalis Pedis pulse = 2/4 Bilateral,  Posterior Tibial pulse = 1/4 Bilateral,  Capillary Fill Time < 5 seconds  Neurologic: Epicritic sensation diminished  to the level of ankle using the 5.07/10g [employer].  Musculosketal:  There is no pain with palpation to the ulceration site on right foot, pes planus foot type with significant talonavicular bulge, right greater than left foot with residual chronic none active Charcot. No pain with compression to calves bilateral.  Strength acceptable for patient status.   Assessment and Plan:  Problem List Items Addressed This Visit    None    Visit Diagnoses    Right foot ulcer, with necrosis of muscle (HCC)    -  Primary    Relevant Orders    WOUND CULTURE ([facility] ONLY)    S/P foot surgery, right        Right foot pain        Pes planus of both feet        Diabetic polyneuropathy associated with type 2 diabetes mellitus (HCC)          -  Examined patient and discussed the progression of the wound and treatment alternatives. -Cleansed ulceration with antimicrobial spray and then treated hypergranular areas with silver nitrate and packed open with dry gauze and applied dry sterile dressing secured with Ace wrap. Daily Nursing orders continue with antimicrobral wound wash and aquacell AG hydrofiber with silver.  -Continue with tetracycline 500 mg twice a day  and Ampicillin 500 mg every 6 hours;Patient will like to change over to outpatient IV infusion; requested Johnsie Kindred and will have office staff to check to make sure that it will cover all organisms that grew out doing most recent wound culture -Wound with tissue culture obtained today and sent to [facility]. Discussed with patient will consider misonix debridement and delayed primary closure when wound cultures are negative and antibiotics are completed -Continue with limited weightbearing for transfers only with post op shoe and with the assistance of a rolling walker. No excessive walking or standing.  -Patient to return to office in 1 week or sooner if problems or issues arise.  Asencion Islam, DPM

## 2015-10-06 ENCOUNTER — Telehealth: Payer: Self-pay | Admitting: *Deleted

## 2015-10-06 DIAGNOSIS — I129 Hypertensive chronic kidney disease with stage 1 through stage 4 chronic kidney disease, or unspecified chronic kidney disease: Secondary | ICD-10-CM | POA: Diagnosis not present

## 2015-10-06 DIAGNOSIS — L97513 Non-pressure chronic ulcer of other part of right foot with necrosis of muscle: Secondary | ICD-10-CM

## 2015-10-06 DIAGNOSIS — E1140 Type 2 diabetes mellitus with diabetic neuropathy, unspecified: Secondary | ICD-10-CM | POA: Diagnosis not present

## 2015-10-06 DIAGNOSIS — N181 Chronic kidney disease, stage 1: Secondary | ICD-10-CM | POA: Diagnosis not present

## 2015-10-06 DIAGNOSIS — E11621 Type 2 diabetes mellitus with foot ulcer: Secondary | ICD-10-CM | POA: Diagnosis not present

## 2015-10-06 DIAGNOSIS — Z792 Long term (current) use of antibiotics: Secondary | ICD-10-CM | POA: Diagnosis not present

## 2015-10-06 DIAGNOSIS — M1990 Unspecified osteoarthritis, unspecified site: Secondary | ICD-10-CM | POA: Diagnosis not present

## 2015-10-06 DIAGNOSIS — L03115 Cellulitis of right lower limb: Secondary | ICD-10-CM | POA: Diagnosis not present

## 2015-10-06 DIAGNOSIS — E1159 Type 2 diabetes mellitus with other circulatory complications: Secondary | ICD-10-CM | POA: Diagnosis not present

## 2015-10-06 DIAGNOSIS — Z79891 Long term (current) use of opiate analgesic: Secondary | ICD-10-CM | POA: Diagnosis not present

## 2015-10-06 DIAGNOSIS — I350 Nonrheumatic aortic (valve) stenosis: Secondary | ICD-10-CM | POA: Diagnosis not present

## 2015-10-06 DIAGNOSIS — D649 Anemia, unspecified: Secondary | ICD-10-CM | POA: Diagnosis not present

## 2015-10-06 DIAGNOSIS — L97519 Non-pressure chronic ulcer of other part of right foot with unspecified severity: Secondary | ICD-10-CM | POA: Diagnosis not present

## 2015-10-06 DIAGNOSIS — Z7984 Long term (current) use of oral hypoglycemic drugs: Secondary | ICD-10-CM | POA: Diagnosis not present

## 2015-10-06 NOTE — Telephone Encounter (Addendum)
Emily Miranda - [facility] states if pt is to have the IV infusion outpt, then she will not be considered homebound and no home health nurse will be able to be assigned, but if do in-home which they do will be able to continue the home health nursing.  [facility] is contracted with [facility] pharmacy for IV and Supplies. 10/10/2015-DrMarylene Land ordered keep pt at in-home infusion.  10/11/2015-Faxed referral, pt clinicals and demographics to [facility].  Pt asked if needed to keep tomorrow's appt with Dr. Marylene Land.  I informed pt she should continue her care with Dr. Marylene Land until further directed, which was once her care had been turned over to [facility].

## 2015-10-07 DIAGNOSIS — D649 Anemia, unspecified: Secondary | ICD-10-CM | POA: Diagnosis not present

## 2015-10-07 DIAGNOSIS — I129 Hypertensive chronic kidney disease with stage 1 through stage 4 chronic kidney disease, or unspecified chronic kidney disease: Secondary | ICD-10-CM | POA: Diagnosis not present

## 2015-10-07 DIAGNOSIS — Z79891 Long term (current) use of opiate analgesic: Secondary | ICD-10-CM | POA: Diagnosis not present

## 2015-10-07 DIAGNOSIS — E1140 Type 2 diabetes mellitus with diabetic neuropathy, unspecified: Secondary | ICD-10-CM | POA: Diagnosis not present

## 2015-10-07 DIAGNOSIS — M1990 Unspecified osteoarthritis, unspecified site: Secondary | ICD-10-CM | POA: Diagnosis not present

## 2015-10-07 DIAGNOSIS — Z7984 Long term (current) use of oral hypoglycemic drugs: Secondary | ICD-10-CM | POA: Diagnosis not present

## 2015-10-07 DIAGNOSIS — Z792 Long term (current) use of antibiotics: Secondary | ICD-10-CM | POA: Diagnosis not present

## 2015-10-07 DIAGNOSIS — E11621 Type 2 diabetes mellitus with foot ulcer: Secondary | ICD-10-CM | POA: Diagnosis not present

## 2015-10-07 DIAGNOSIS — E1159 Type 2 diabetes mellitus with other circulatory complications: Secondary | ICD-10-CM | POA: Diagnosis not present

## 2015-10-07 DIAGNOSIS — L03115 Cellulitis of right lower limb: Secondary | ICD-10-CM | POA: Diagnosis not present

## 2015-10-07 DIAGNOSIS — L97519 Non-pressure chronic ulcer of other part of right foot with unspecified severity: Secondary | ICD-10-CM | POA: Diagnosis not present

## 2015-10-07 DIAGNOSIS — I350 Nonrheumatic aortic (valve) stenosis: Secondary | ICD-10-CM | POA: Diagnosis not present

## 2015-10-07 DIAGNOSIS — N181 Chronic kidney disease, stage 1: Secondary | ICD-10-CM | POA: Diagnosis not present

## 2015-10-08 ENCOUNTER — Other Ambulatory Visit: Payer: Self-pay | Admitting: Sports Medicine

## 2015-10-08 ENCOUNTER — Other Ambulatory Visit: Payer: Self-pay | Admitting: Podiatry

## 2015-10-08 DIAGNOSIS — I350 Nonrheumatic aortic (valve) stenosis: Secondary | ICD-10-CM | POA: Diagnosis not present

## 2015-10-08 DIAGNOSIS — L97519 Non-pressure chronic ulcer of other part of right foot with unspecified severity: Secondary | ICD-10-CM | POA: Diagnosis not present

## 2015-10-08 DIAGNOSIS — Z79891 Long term (current) use of opiate analgesic: Secondary | ICD-10-CM | POA: Diagnosis not present

## 2015-10-08 DIAGNOSIS — L03115 Cellulitis of right lower limb: Secondary | ICD-10-CM | POA: Diagnosis not present

## 2015-10-08 DIAGNOSIS — E1140 Type 2 diabetes mellitus with diabetic neuropathy, unspecified: Secondary | ICD-10-CM | POA: Diagnosis not present

## 2015-10-08 DIAGNOSIS — L97513 Non-pressure chronic ulcer of other part of right foot with necrosis of muscle: Secondary | ICD-10-CM

## 2015-10-08 DIAGNOSIS — I129 Hypertensive chronic kidney disease with stage 1 through stage 4 chronic kidney disease, or unspecified chronic kidney disease: Secondary | ICD-10-CM | POA: Diagnosis not present

## 2015-10-08 DIAGNOSIS — M1990 Unspecified osteoarthritis, unspecified site: Secondary | ICD-10-CM | POA: Diagnosis not present

## 2015-10-08 DIAGNOSIS — Z792 Long term (current) use of antibiotics: Secondary | ICD-10-CM | POA: Diagnosis not present

## 2015-10-08 DIAGNOSIS — E11621 Type 2 diabetes mellitus with foot ulcer: Secondary | ICD-10-CM | POA: Diagnosis not present

## 2015-10-08 DIAGNOSIS — D649 Anemia, unspecified: Secondary | ICD-10-CM | POA: Diagnosis not present

## 2015-10-08 DIAGNOSIS — Z7984 Long term (current) use of oral hypoglycemic drugs: Secondary | ICD-10-CM | POA: Diagnosis not present

## 2015-10-08 DIAGNOSIS — N181 Chronic kidney disease, stage 1: Secondary | ICD-10-CM | POA: Diagnosis not present

## 2015-10-08 DIAGNOSIS — E1159 Type 2 diabetes mellitus with other circulatory complications: Secondary | ICD-10-CM | POA: Diagnosis not present

## 2015-10-08 MED ORDER — TETRACYCLINE HCL 500 MG PO CAPS
500.0000 mg | ORAL_CAPSULE | Freq: Two times a day (BID) | ORAL | Status: DC
Start: 2015-10-08 — End: 2016-02-22

## 2015-10-08 NOTE — Progress Notes (Signed)
Patient called the on call number 10/08/15 at 4:35pm and needed a refill of tetracycline. Re-ordered. No other concerns.

## 2015-10-09 ENCOUNTER — Other Ambulatory Visit: Payer: Self-pay | Admitting: Sports Medicine

## 2015-10-09 DIAGNOSIS — Z792 Long term (current) use of antibiotics: Secondary | ICD-10-CM | POA: Diagnosis not present

## 2015-10-09 DIAGNOSIS — E1140 Type 2 diabetes mellitus with diabetic neuropathy, unspecified: Secondary | ICD-10-CM | POA: Diagnosis not present

## 2015-10-09 DIAGNOSIS — Z7984 Long term (current) use of oral hypoglycemic drugs: Secondary | ICD-10-CM | POA: Diagnosis not present

## 2015-10-09 DIAGNOSIS — E11621 Type 2 diabetes mellitus with foot ulcer: Secondary | ICD-10-CM | POA: Diagnosis not present

## 2015-10-09 DIAGNOSIS — D649 Anemia, unspecified: Secondary | ICD-10-CM | POA: Diagnosis not present

## 2015-10-09 DIAGNOSIS — N181 Chronic kidney disease, stage 1: Secondary | ICD-10-CM | POA: Diagnosis not present

## 2015-10-09 DIAGNOSIS — L03115 Cellulitis of right lower limb: Secondary | ICD-10-CM | POA: Diagnosis not present

## 2015-10-09 DIAGNOSIS — M1990 Unspecified osteoarthritis, unspecified site: Secondary | ICD-10-CM | POA: Diagnosis not present

## 2015-10-09 DIAGNOSIS — I129 Hypertensive chronic kidney disease with stage 1 through stage 4 chronic kidney disease, or unspecified chronic kidney disease: Secondary | ICD-10-CM | POA: Diagnosis not present

## 2015-10-09 DIAGNOSIS — I350 Nonrheumatic aortic (valve) stenosis: Secondary | ICD-10-CM | POA: Diagnosis not present

## 2015-10-09 DIAGNOSIS — L97519 Non-pressure chronic ulcer of other part of right foot with unspecified severity: Secondary | ICD-10-CM | POA: Diagnosis not present

## 2015-10-09 DIAGNOSIS — Z79891 Long term (current) use of opiate analgesic: Secondary | ICD-10-CM | POA: Diagnosis not present

## 2015-10-09 DIAGNOSIS — E1159 Type 2 diabetes mellitus with other circulatory complications: Secondary | ICD-10-CM | POA: Diagnosis not present

## 2015-10-10 ENCOUNTER — Other Ambulatory Visit: Payer: Self-pay | Admitting: Sports Medicine

## 2015-10-10 DIAGNOSIS — D649 Anemia, unspecified: Secondary | ICD-10-CM | POA: Diagnosis not present

## 2015-10-10 DIAGNOSIS — Z792 Long term (current) use of antibiotics: Secondary | ICD-10-CM | POA: Diagnosis not present

## 2015-10-10 DIAGNOSIS — Z79891 Long term (current) use of opiate analgesic: Secondary | ICD-10-CM | POA: Diagnosis not present

## 2015-10-10 DIAGNOSIS — L03115 Cellulitis of right lower limb: Secondary | ICD-10-CM | POA: Diagnosis not present

## 2015-10-10 DIAGNOSIS — M1990 Unspecified osteoarthritis, unspecified site: Secondary | ICD-10-CM | POA: Diagnosis not present

## 2015-10-10 DIAGNOSIS — E11621 Type 2 diabetes mellitus with foot ulcer: Secondary | ICD-10-CM | POA: Diagnosis not present

## 2015-10-10 DIAGNOSIS — Z7984 Long term (current) use of oral hypoglycemic drugs: Secondary | ICD-10-CM | POA: Diagnosis not present

## 2015-10-10 DIAGNOSIS — I350 Nonrheumatic aortic (valve) stenosis: Secondary | ICD-10-CM | POA: Diagnosis not present

## 2015-10-10 DIAGNOSIS — N181 Chronic kidney disease, stage 1: Secondary | ICD-10-CM | POA: Diagnosis not present

## 2015-10-10 DIAGNOSIS — L97519 Non-pressure chronic ulcer of other part of right foot with unspecified severity: Secondary | ICD-10-CM | POA: Diagnosis not present

## 2015-10-10 DIAGNOSIS — I129 Hypertensive chronic kidney disease with stage 1 through stage 4 chronic kidney disease, or unspecified chronic kidney disease: Secondary | ICD-10-CM | POA: Diagnosis not present

## 2015-10-10 DIAGNOSIS — E1159 Type 2 diabetes mellitus with other circulatory complications: Secondary | ICD-10-CM | POA: Diagnosis not present

## 2015-10-10 DIAGNOSIS — E1140 Type 2 diabetes mellitus with diabetic neuropathy, unspecified: Secondary | ICD-10-CM | POA: Diagnosis not present

## 2015-10-10 NOTE — Telephone Encounter (Signed)
Thank you. Yes lets keep her home care and in-home infusion Thanks Dr. Marylene Land

## 2015-10-10 NOTE — Telephone Encounter (Signed)
Emily Miranda alone will not give me complete coverage of all her organisms. For Home IV infusions see my previous message about Linezolid and Ertapenem, these are the 2 antibiotics that we will try to use for 14 days Thanks Dr. Marylene Land

## 2015-10-11 DIAGNOSIS — E1159 Type 2 diabetes mellitus with other circulatory complications: Secondary | ICD-10-CM | POA: Diagnosis not present

## 2015-10-11 DIAGNOSIS — Z79891 Long term (current) use of opiate analgesic: Secondary | ICD-10-CM | POA: Diagnosis not present

## 2015-10-11 DIAGNOSIS — D649 Anemia, unspecified: Secondary | ICD-10-CM | POA: Diagnosis not present

## 2015-10-11 DIAGNOSIS — I350 Nonrheumatic aortic (valve) stenosis: Secondary | ICD-10-CM | POA: Diagnosis not present

## 2015-10-11 DIAGNOSIS — E11621 Type 2 diabetes mellitus with foot ulcer: Secondary | ICD-10-CM | POA: Diagnosis not present

## 2015-10-11 DIAGNOSIS — Z7984 Long term (current) use of oral hypoglycemic drugs: Secondary | ICD-10-CM | POA: Diagnosis not present

## 2015-10-11 DIAGNOSIS — N181 Chronic kidney disease, stage 1: Secondary | ICD-10-CM | POA: Diagnosis not present

## 2015-10-11 DIAGNOSIS — Z792 Long term (current) use of antibiotics: Secondary | ICD-10-CM | POA: Diagnosis not present

## 2015-10-11 DIAGNOSIS — I129 Hypertensive chronic kidney disease with stage 1 through stage 4 chronic kidney disease, or unspecified chronic kidney disease: Secondary | ICD-10-CM | POA: Diagnosis not present

## 2015-10-11 DIAGNOSIS — E1140 Type 2 diabetes mellitus with diabetic neuropathy, unspecified: Secondary | ICD-10-CM | POA: Diagnosis not present

## 2015-10-11 DIAGNOSIS — L03115 Cellulitis of right lower limb: Secondary | ICD-10-CM | POA: Diagnosis not present

## 2015-10-11 DIAGNOSIS — M1990 Unspecified osteoarthritis, unspecified site: Secondary | ICD-10-CM | POA: Diagnosis not present

## 2015-10-11 DIAGNOSIS — L97519 Non-pressure chronic ulcer of other part of right foot with unspecified severity: Secondary | ICD-10-CM | POA: Diagnosis not present

## 2015-10-11 NOTE — Telephone Encounter (Addendum)
-----   Message from Asencion Islam, [place] sent at 10/11/2015 10:06 AM EDT ----- Regarding: RE: IV infusion on [facility]patient [facility]  No Orbactiv. Both meds Ertapenem and Linezolid IV. Patient is having a hard time tolerating oral.  Thanks  ----- Message -----    From: Marissa Nestle, RN    Sent: 10/11/2015   8:30 AM      To: Asencion Islam, DPM Subject: RE: IV infusion on [facility]patient [facility]            Dr. Marylene Land, I'm sorry, I don't understand.  Are we not going to use Orbactiv.  So is the Ertapenem injectable/IV and the Linexolid 600mg  every 12 hours oral.  I know you're busy, but I need quick advise, I've never ordered these.  [name] ----- Message -----    From: Asencion Islam, DPM    Sent: 10/10/2015   7:28 AM      To: Marissa Nestle, RN Subject: RE: IV infusion on Outpatient [facility]            Ok thank you. She's currently on Tetracycline and Ampicillin orally. For the IV, We can change her over to Ertapenem 1gram daily and Linezolid 600mg  q12h x 14 days total and also can you look and see Infectious disease doctor for referral in the [place] area. Thanks Dr. Marylene Land  ----- Message -----    From: Marissa Nestle, RN    Sent: 10/06/2015  10:14 AM      To: Asencion Islam, DPM Subject: RE: IV infusion on Outpatient [facility]            Dr. Marylene Land, I have a book on Orbactiv, and I don't see that these baddies are covered. I will put this on the top of my desk if you would like to review.  I can get the forms sent out asap with your order.  [name] ----- Message -----    From: Asencion Islam, DPM    Sent: 10/05/2015   3:03 PM      To: Marissa Nestle, RN Subject: IV infusion on Outpatient bases                Orbactiv outpatient IV antibiotic infusion  Inpatient culture revealed fungoides Proteus mirabilis, e. Facelis, pKarle Starch,  Please make sure there is coverage for these organisms Thanks Dr. Marylene Land.  10/12/2015-I spoke with [facility], she  states send doctor's orders, History and Physical, allergies and medications and pt demographics to fax [phone]. Vernona Rieger states they will call the pt to schedule once orders are sent and contact pt's [facility] caregiver - Rosey Bath [phone].  I spoke with pt and she states does not want the PICC line and will talk to Dr. Marylene Land at her appt today.

## 2015-10-12 ENCOUNTER — Ambulatory Visit (INDEPENDENT_AMBULATORY_CARE_PROVIDER_SITE_OTHER): Payer: Medicare Other | Admitting: Sports Medicine

## 2015-10-12 ENCOUNTER — Encounter: Payer: Self-pay | Admitting: Sports Medicine

## 2015-10-12 DIAGNOSIS — E1142 Type 2 diabetes mellitus with diabetic polyneuropathy: Secondary | ICD-10-CM

## 2015-10-12 DIAGNOSIS — Z9889 Other specified postprocedural states: Secondary | ICD-10-CM

## 2015-10-12 DIAGNOSIS — L97513 Non-pressure chronic ulcer of other part of right foot with necrosis of muscle: Secondary | ICD-10-CM

## 2015-10-12 DIAGNOSIS — M2141 Flat foot [pes planus] (acquired), right foot: Secondary | ICD-10-CM

## 2015-10-12 DIAGNOSIS — M79671 Pain in right foot: Secondary | ICD-10-CM

## 2015-10-12 DIAGNOSIS — M2142 Flat foot [pes planus] (acquired), left foot: Secondary | ICD-10-CM

## 2015-10-12 NOTE — Progress Notes (Signed)
Patient ID: Emily Miranda, female   DOB: 1940/08/17, 75 y.o.   MRN: [id number]  Subjective: Emily Miranda is a 75 y.o. female patient seen in office for follow up evaluation of ulceration on right foot, POV #4 s/p incision and drainage with bone and tissue culture and hospital admission on 09/08/2015 patient has daily home nursing changing dressings. Reports that she is having some nausea with taking antibiotics. Denies any other issues at this time.   Reports fasting blood sugar has been good.   Patient Active Problem List   Diagnosis Date Noted  . Abdominal pain, RLQ (right lower quadrant) -chronic since 2009 06/02/2013  . Constipation, chronic 06/02/2013  . SUI (stress urinary incontinence, female) 06/02/2013  . Recurrent UTI (urinary tract infection) 06/02/2013  . HTN (hypertension)   . CHEST PAIN 11/27/2009  . OVERWEIGHT 11/28/2008  . ESSENTIAL HYPERTENSION, BENIGN 11/28/2008  . DM 11/25/2008  . AORTIC STENOSIS 11/25/2008  . PANCREATITIS, HX OF 11/25/2008   Current Outpatient Prescriptions on File Prior to Visit  Medication Sig Dispense Refill  . amLODipine (NORVASC) 5 MG tablet Take 5 mg by mouth daily.     Marland Kitchen ampicillin (PRINCIPEN) 500 MG capsule Take 1 capsule (500 mg total) by mouth 4 (four) times daily. 56 capsule 0  . Ascorbic Acid (VITAMIN C) 100 MG tablet Take 100 mg by mouth daily.      . cephALEXin (KEFLEX) 500 MG capsule Take 1 capsule (500 mg total) by mouth 2 (two) times daily. 28 capsule 0  . chlorthalidone (HYGROTON) 25 MG tablet Take 0.5 tablets (12.5 mg total) by mouth daily. 15 tablet 11  . furosemide (LASIX) 20 MG tablet Take 20 mg by mouth daily.     Marland Kitchen glimepiride (AMARYL) 4 MG tablet Take 4 mg by mouth daily with breakfast.    . HYDROcodone-acetaminophen (NORCO) 5-325 MG per tablet Take 1 tablet by mouth every 6 (six) hours as needed. 10 tablet 0  . lisinopril (PRINIVIL,ZESTRIL) 10 MG tablet Take 10 mg by mouth daily.    . methocarbamol (ROBAXIN) 500 MG  tablet Take 1.5 tablets (750 mg total) by mouth every 8 (eight) hours as needed for muscle spasms (use for muscle cramps/pain). 30 tablet 2  . naproxen (NAPROSYN) 500 MG tablet Take 1 tablet (500 mg total) by mouth 2 (two) times daily with a meal. 40 tablet 1  . nitrofurantoin, macrocrystal-monohydrate, (MACROBID) 100 MG capsule Take 100 mg by mouth 2 (two) times daily.     . ONE TOUCH ULTRA TEST test strip 1 each by Other route once a week.     Marland Kitchen Phenyleph-Doxylamine-DM-APAP (ALKA SELTZER PLUS PO) Take 1 tablet by mouth daily as needed (congestion).    . pregabalin (LYRICA) 75 MG capsule Take 1 capsule (75 mg total) by mouth 2 (two) times daily. 60 capsule 1  . sulfamethoxazole-trimethoprim (BACTRIM DS,SEPTRA DS) 800-160 MG tablet Take 1 tablet by mouth 2 (two) times daily. 20 tablet 0  . tetracycline (ACHROMYCIN,SUMYCIN) 500 MG capsule TAKE 1 CAPSULE (500 MG TOTAL) BY MOUTH 2 (TWO) TIMES DAILY. 28 capsule 0  . tetracycline (ACHROMYCIN,SUMYCIN) 500 MG capsule Take 1 capsule (500 mg total) by mouth 2 (two) times daily. 28 capsule 0  . TRADJENTA 5 MG TABS tablet Take 5 mg by mouth daily.     . traMADol (ULTRAM) 50 MG tablet TAKE 1 TABLET BY MOUTH EVERY 8 HOURS AS NEEDED FOR FOOT PAIN 30 tablet 0   Current Facility-Administered Medications on File Prior to Visit  Medication Dose Route Frequency Provider Last Rate Last Dose  . triamcinolone acetonide (KENALOG) 10 MG/ML injection 10 mg  10 mg Other Once Asencion Islam, DPM       Allergies  Allergen Reactions  . Levofloxacin Nausea And Vomiting    Other reaction(s): Confusion    Recent Results (from the past 2160 hour(s))  Wound culture     Status: Abnormal   Collection Time: 09/07/15  4:03 PM  Result Value Ref Range   Gram Stain Result Final report    Result 1 Comment     Comment: No white blood cells seen.   RESULT 2 Comment     Comment: Few gram negative rods.   Aerobic Bacterial Culture Final report (A)    Result 1 Proteus mirabilis  (A)     Comment: Heavy growth   Result 2 Staphylococcus aureus (A)     Comment: Moderate growth Based on resistance to penicillin and susceptibility to oxacillin this isolate would be susceptible to: * Penicillinase-stable penicillins; such as:     Cloxacillin     Dicloxacillin     Nafcillin * Beta-lactam/beta-lactamase inhibitor combinations; such as:     Amoxicillin-clavulanic acid     Ampicillin-sulbactam * Antistaphylococcal cephems; such as:     Cefaclor     Cefuroxime * Antistaphylococcal carbapenems; such as:     Imipenem     Meropenem    ANTIMICROBIAL SUSCEPTIBILITY Comment     Comment:       ** S = Susceptible; I = Intermediate; R = Resistant **                    P = Positive; N = Negative             MICS are expressed in micrograms per mL    Antibiotic                 RSLT#1    RSLT#2    RSLT#3    RSLT#4 Amoxicillin/Clavulanic Acid    S Ampicillin                     S Cefepime                       S Ceftriaxone                    S Cefuroxime                     S Ciprofloxacin                  S         S Clindamycin                              S Ertapenem                      S Erythromycin                             S Gentamicin                     S         S Levofloxacin  S         S Linezolid                                S Moxifloxacin                             S Oxacillin                                S Penicillin                               R Piperacillin                   S Quinupristin/Dalfopristin                S Rifampin                                 S Tetracycline                   R         S Tobr amycin                     S Trimethoprim/Sulfa             R         S Vancomycin                               S     Objective: There were no vitals filed for this visit.  General: Patient is awake, alert, oriented x 3 and in no acute distress.  Dermatology: Skin is warm and dry bilateral with a full thickness  ulceration down to the level of muscle that appears to be filling in measuring 9 cm in length by approximately 4 cm in width at the widest point extending from the plantar medial midfoot all the way to the hallux, status post incision and drainage right foot with moderate granular buds and coverage of underlying bone noted, there is rolled borders at the wound margins with 1+ pitting edema, no warmth, no erythema, no cellulitis, no malodor, no active drainage, however, there is mild serous drainage noted on the inner dressing layers. There is dry blood to be right third toenail with no acute signs of infection unchanged from prior.  Vascular: Dorsalis Pedis pulse = 2/4 Bilateral,  Posterior Tibial pulse = 1/4 Bilateral,  Capillary Fill Time < 5 seconds  Neurologic: Epicritic sensation diminished  to the level of ankle using the 5.07/10g [employer].  Musculosketal:  There is no pain with palpation to the ulceration site on right foot, pes planus foot type with significant talonavicular bulge, right greater than left foot with residual chronic none active Charcot. No pain with compression to calves bilateral.  Strength acceptable for patient status.   Assessment and Plan:  Problem List Items Addressed This Visit    None    Visit Diagnoses    Right foot ulcer, with necrosis of muscle (HCC)    -  Primary    S/P foot surgery, right  Right foot pain        Pes planus of both feet        Diabetic polyneuropathy associated with type 2 diabetes mellitus (HCC)          -Examined patient and discussed the progression of the wound and treatment alternatives. -Cleansed ulceration with antimicrobial spray and then treated hypergranular areas with silver nitrate and packed open with dry gauze and applied dry sterile dressing secured with Ace wrap. Daily Nursing orders continue with antimicrobral wound wash and aquacell AG hydrofiber with silver.  -Continue with tetracycline 500 mg  twice a day and Ampicillin 500 mg every 6 hours;Patient Would like to convert to IV since having a difficult time tolerating PO antibiotics; patient is in the process of getting PICC line placement to continue antibiotic therapy for possible 2 more weeks with ID consultation placed for any additional recommendations since intraoperative cultures revealed actinomycies, enterococcus faecalies, Proteus mirabilis, fungoides, staph aureus.  -Current wound culture (10-05-15) [facility] reveals no growth; however, will likely have to reculture area, Once antibiotics are completed to make sure there is no recurrence of infection -Discussed with patient will consider misonix debridement and delayed primary closure when wound cultures are negative and antibiotics are completed -Continue with limited weightbearing for transfers only with post op shoe and with the assistance of a rolling walker. No excessive walking or standing.  -Patient to return to office in 1 week or sooner if problems or issues arise.  Asencion Islam, DPM

## 2015-10-12 NOTE — Telephone Encounter (Signed)
Ok [place]. Advise the patient that the only way to get IV antibiotics for the next 2 weeks is via PICC because over time peripheral IVs may cause the vessels in her arms to get weak/rupture from giving antibiotics for weeks at a time. If she continues to refuse PICC then we can do a peripheral IV in her arm but it may have to be changed every few days so this will require multiple sticks or finding new places to put the IV -Dr. Marylene Land

## 2015-10-13 DIAGNOSIS — E1140 Type 2 diabetes mellitus with diabetic neuropathy, unspecified: Secondary | ICD-10-CM | POA: Diagnosis not present

## 2015-10-13 DIAGNOSIS — N181 Chronic kidney disease, stage 1: Secondary | ICD-10-CM | POA: Diagnosis not present

## 2015-10-13 DIAGNOSIS — I350 Nonrheumatic aortic (valve) stenosis: Secondary | ICD-10-CM | POA: Diagnosis not present

## 2015-10-13 DIAGNOSIS — Z79891 Long term (current) use of opiate analgesic: Secondary | ICD-10-CM | POA: Diagnosis not present

## 2015-10-13 DIAGNOSIS — Z792 Long term (current) use of antibiotics: Secondary | ICD-10-CM | POA: Diagnosis not present

## 2015-10-13 DIAGNOSIS — E11621 Type 2 diabetes mellitus with foot ulcer: Secondary | ICD-10-CM | POA: Diagnosis not present

## 2015-10-13 DIAGNOSIS — D649 Anemia, unspecified: Secondary | ICD-10-CM | POA: Diagnosis not present

## 2015-10-13 DIAGNOSIS — M1990 Unspecified osteoarthritis, unspecified site: Secondary | ICD-10-CM | POA: Diagnosis not present

## 2015-10-13 DIAGNOSIS — E1159 Type 2 diabetes mellitus with other circulatory complications: Secondary | ICD-10-CM | POA: Diagnosis not present

## 2015-10-13 DIAGNOSIS — L03115 Cellulitis of right lower limb: Secondary | ICD-10-CM | POA: Diagnosis not present

## 2015-10-13 DIAGNOSIS — Z7984 Long term (current) use of oral hypoglycemic drugs: Secondary | ICD-10-CM | POA: Diagnosis not present

## 2015-10-13 DIAGNOSIS — L97519 Non-pressure chronic ulcer of other part of right foot with unspecified severity: Secondary | ICD-10-CM | POA: Diagnosis not present

## 2015-10-13 DIAGNOSIS — I129 Hypertensive chronic kidney disease with stage 1 through stage 4 chronic kidney disease, or unspecified chronic kidney disease: Secondary | ICD-10-CM | POA: Diagnosis not present

## 2015-10-14 DIAGNOSIS — E1159 Type 2 diabetes mellitus with other circulatory complications: Secondary | ICD-10-CM | POA: Diagnosis not present

## 2015-10-14 DIAGNOSIS — Z792 Long term (current) use of antibiotics: Secondary | ICD-10-CM | POA: Diagnosis not present

## 2015-10-14 DIAGNOSIS — Z79891 Long term (current) use of opiate analgesic: Secondary | ICD-10-CM | POA: Diagnosis not present

## 2015-10-14 DIAGNOSIS — E1140 Type 2 diabetes mellitus with diabetic neuropathy, unspecified: Secondary | ICD-10-CM | POA: Diagnosis not present

## 2015-10-14 DIAGNOSIS — D649 Anemia, unspecified: Secondary | ICD-10-CM | POA: Diagnosis not present

## 2015-10-14 DIAGNOSIS — I129 Hypertensive chronic kidney disease with stage 1 through stage 4 chronic kidney disease, or unspecified chronic kidney disease: Secondary | ICD-10-CM | POA: Diagnosis not present

## 2015-10-14 DIAGNOSIS — E11621 Type 2 diabetes mellitus with foot ulcer: Secondary | ICD-10-CM | POA: Diagnosis not present

## 2015-10-14 DIAGNOSIS — M1990 Unspecified osteoarthritis, unspecified site: Secondary | ICD-10-CM | POA: Diagnosis not present

## 2015-10-14 DIAGNOSIS — I350 Nonrheumatic aortic (valve) stenosis: Secondary | ICD-10-CM | POA: Diagnosis not present

## 2015-10-14 DIAGNOSIS — L03115 Cellulitis of right lower limb: Secondary | ICD-10-CM | POA: Diagnosis not present

## 2015-10-14 DIAGNOSIS — Z7984 Long term (current) use of oral hypoglycemic drugs: Secondary | ICD-10-CM | POA: Diagnosis not present

## 2015-10-14 DIAGNOSIS — N181 Chronic kidney disease, stage 1: Secondary | ICD-10-CM | POA: Diagnosis not present

## 2015-10-14 DIAGNOSIS — L97519 Non-pressure chronic ulcer of other part of right foot with unspecified severity: Secondary | ICD-10-CM | POA: Diagnosis not present

## 2015-10-15 DIAGNOSIS — E11621 Type 2 diabetes mellitus with foot ulcer: Secondary | ICD-10-CM | POA: Diagnosis not present

## 2015-10-15 DIAGNOSIS — Z7984 Long term (current) use of oral hypoglycemic drugs: Secondary | ICD-10-CM | POA: Diagnosis not present

## 2015-10-15 DIAGNOSIS — N181 Chronic kidney disease, stage 1: Secondary | ICD-10-CM | POA: Diagnosis not present

## 2015-10-15 DIAGNOSIS — M1990 Unspecified osteoarthritis, unspecified site: Secondary | ICD-10-CM | POA: Diagnosis not present

## 2015-10-15 DIAGNOSIS — E1159 Type 2 diabetes mellitus with other circulatory complications: Secondary | ICD-10-CM | POA: Diagnosis not present

## 2015-10-15 DIAGNOSIS — L03115 Cellulitis of right lower limb: Secondary | ICD-10-CM | POA: Diagnosis not present

## 2015-10-15 DIAGNOSIS — I350 Nonrheumatic aortic (valve) stenosis: Secondary | ICD-10-CM | POA: Diagnosis not present

## 2015-10-15 DIAGNOSIS — D649 Anemia, unspecified: Secondary | ICD-10-CM | POA: Diagnosis not present

## 2015-10-15 DIAGNOSIS — Z792 Long term (current) use of antibiotics: Secondary | ICD-10-CM | POA: Diagnosis not present

## 2015-10-15 DIAGNOSIS — I129 Hypertensive chronic kidney disease with stage 1 through stage 4 chronic kidney disease, or unspecified chronic kidney disease: Secondary | ICD-10-CM | POA: Diagnosis not present

## 2015-10-15 DIAGNOSIS — Z79891 Long term (current) use of opiate analgesic: Secondary | ICD-10-CM | POA: Diagnosis not present

## 2015-10-15 DIAGNOSIS — E1140 Type 2 diabetes mellitus with diabetic neuropathy, unspecified: Secondary | ICD-10-CM | POA: Diagnosis not present

## 2015-10-15 DIAGNOSIS — L97519 Non-pressure chronic ulcer of other part of right foot with unspecified severity: Secondary | ICD-10-CM | POA: Diagnosis not present

## 2015-10-16 DIAGNOSIS — Z79891 Long term (current) use of opiate analgesic: Secondary | ICD-10-CM | POA: Diagnosis not present

## 2015-10-16 DIAGNOSIS — E11621 Type 2 diabetes mellitus with foot ulcer: Secondary | ICD-10-CM | POA: Diagnosis not present

## 2015-10-16 DIAGNOSIS — E1140 Type 2 diabetes mellitus with diabetic neuropathy, unspecified: Secondary | ICD-10-CM | POA: Diagnosis not present

## 2015-10-16 DIAGNOSIS — I350 Nonrheumatic aortic (valve) stenosis: Secondary | ICD-10-CM | POA: Diagnosis not present

## 2015-10-16 DIAGNOSIS — D649 Anemia, unspecified: Secondary | ICD-10-CM | POA: Diagnosis not present

## 2015-10-16 DIAGNOSIS — N181 Chronic kidney disease, stage 1: Secondary | ICD-10-CM | POA: Diagnosis not present

## 2015-10-16 DIAGNOSIS — I129 Hypertensive chronic kidney disease with stage 1 through stage 4 chronic kidney disease, or unspecified chronic kidney disease: Secondary | ICD-10-CM | POA: Diagnosis not present

## 2015-10-16 DIAGNOSIS — Z7984 Long term (current) use of oral hypoglycemic drugs: Secondary | ICD-10-CM | POA: Diagnosis not present

## 2015-10-16 DIAGNOSIS — L97519 Non-pressure chronic ulcer of other part of right foot with unspecified severity: Secondary | ICD-10-CM | POA: Diagnosis not present

## 2015-10-16 DIAGNOSIS — L03115 Cellulitis of right lower limb: Secondary | ICD-10-CM | POA: Diagnosis not present

## 2015-10-16 DIAGNOSIS — M1990 Unspecified osteoarthritis, unspecified site: Secondary | ICD-10-CM | POA: Diagnosis not present

## 2015-10-16 DIAGNOSIS — Z792 Long term (current) use of antibiotics: Secondary | ICD-10-CM | POA: Diagnosis not present

## 2015-10-16 DIAGNOSIS — E1159 Type 2 diabetes mellitus with other circulatory complications: Secondary | ICD-10-CM | POA: Diagnosis not present

## 2015-10-17 DIAGNOSIS — L03115 Cellulitis of right lower limb: Secondary | ICD-10-CM | POA: Diagnosis not present

## 2015-10-17 DIAGNOSIS — M1990 Unspecified osteoarthritis, unspecified site: Secondary | ICD-10-CM | POA: Diagnosis not present

## 2015-10-17 DIAGNOSIS — E11621 Type 2 diabetes mellitus with foot ulcer: Secondary | ICD-10-CM | POA: Diagnosis not present

## 2015-10-17 DIAGNOSIS — Z7984 Long term (current) use of oral hypoglycemic drugs: Secondary | ICD-10-CM | POA: Diagnosis not present

## 2015-10-17 DIAGNOSIS — Z792 Long term (current) use of antibiotics: Secondary | ICD-10-CM | POA: Diagnosis not present

## 2015-10-17 DIAGNOSIS — E1140 Type 2 diabetes mellitus with diabetic neuropathy, unspecified: Secondary | ICD-10-CM | POA: Diagnosis not present

## 2015-10-17 DIAGNOSIS — D649 Anemia, unspecified: Secondary | ICD-10-CM | POA: Diagnosis not present

## 2015-10-17 DIAGNOSIS — E1159 Type 2 diabetes mellitus with other circulatory complications: Secondary | ICD-10-CM | POA: Diagnosis not present

## 2015-10-17 DIAGNOSIS — Z79891 Long term (current) use of opiate analgesic: Secondary | ICD-10-CM | POA: Diagnosis not present

## 2015-10-17 DIAGNOSIS — N181 Chronic kidney disease, stage 1: Secondary | ICD-10-CM | POA: Diagnosis not present

## 2015-10-17 DIAGNOSIS — L97519 Non-pressure chronic ulcer of other part of right foot with unspecified severity: Secondary | ICD-10-CM | POA: Diagnosis not present

## 2015-10-17 DIAGNOSIS — I129 Hypertensive chronic kidney disease with stage 1 through stage 4 chronic kidney disease, or unspecified chronic kidney disease: Secondary | ICD-10-CM | POA: Diagnosis not present

## 2015-10-17 DIAGNOSIS — I350 Nonrheumatic aortic (valve) stenosis: Secondary | ICD-10-CM | POA: Diagnosis not present

## 2015-10-18 ENCOUNTER — Telehealth: Payer: Self-pay | Admitting: *Deleted

## 2015-10-18 DIAGNOSIS — N181 Chronic kidney disease, stage 1: Secondary | ICD-10-CM | POA: Diagnosis not present

## 2015-10-18 DIAGNOSIS — E1159 Type 2 diabetes mellitus with other circulatory complications: Secondary | ICD-10-CM | POA: Diagnosis not present

## 2015-10-18 DIAGNOSIS — Z7984 Long term (current) use of oral hypoglycemic drugs: Secondary | ICD-10-CM | POA: Diagnosis not present

## 2015-10-18 DIAGNOSIS — Z79891 Long term (current) use of opiate analgesic: Secondary | ICD-10-CM | POA: Diagnosis not present

## 2015-10-18 DIAGNOSIS — Z792 Long term (current) use of antibiotics: Secondary | ICD-10-CM | POA: Diagnosis not present

## 2015-10-18 DIAGNOSIS — E11621 Type 2 diabetes mellitus with foot ulcer: Secondary | ICD-10-CM | POA: Diagnosis not present

## 2015-10-18 DIAGNOSIS — M1990 Unspecified osteoarthritis, unspecified site: Secondary | ICD-10-CM | POA: Diagnosis not present

## 2015-10-18 DIAGNOSIS — D649 Anemia, unspecified: Secondary | ICD-10-CM | POA: Diagnosis not present

## 2015-10-18 DIAGNOSIS — L03115 Cellulitis of right lower limb: Secondary | ICD-10-CM | POA: Diagnosis not present

## 2015-10-18 DIAGNOSIS — L97519 Non-pressure chronic ulcer of other part of right foot with unspecified severity: Secondary | ICD-10-CM | POA: Diagnosis not present

## 2015-10-18 DIAGNOSIS — E1140 Type 2 diabetes mellitus with diabetic neuropathy, unspecified: Secondary | ICD-10-CM | POA: Diagnosis not present

## 2015-10-18 DIAGNOSIS — I350 Nonrheumatic aortic (valve) stenosis: Secondary | ICD-10-CM | POA: Diagnosis not present

## 2015-10-18 DIAGNOSIS — I129 Hypertensive chronic kidney disease with stage 1 through stage 4 chronic kidney disease, or unspecified chronic kidney disease: Secondary | ICD-10-CM | POA: Diagnosis not present

## 2015-10-18 NOTE — Telephone Encounter (Signed)
Entered in error

## 2015-10-18 NOTE — Telephone Encounter (Signed)
We can start antibiotics. When she sees the infectious disease doctor he or she will give [place] an opinion on if we need to extend antibiotics beyond 14 days or if we need to add or change anything based on her wound cultures that were obtained from surgery since her original cultures grew out multiple types of bacteria -Dr. Marylene Land

## 2015-10-18 NOTE — Telephone Encounter (Addendum)
Left message for [facility] to call, to inform me if surgery scheduling sheet, or med orders needed with their order form.  Called [facility] - Lupita Leash states after pre-certed for PICC insertion, call [facility] with the IV medication orders.  Lupita Leash asked for the IV orders at this time, gave Ertapenem 1g IV daily for 14 days, and Zyvox 600mg  IV every 12 hours for 14 days.  Lupita Leash asked if there is someone at pt's home to be instructed on giving the IV through the PICC line, because they may not be able to perform.  I spoke with pt and she said someone could teach her and her husband although he broke his arm.  Pt asked if Dr. Marylene Land was still going to wait to see if the Infectious Disease would be managing the PICC line and medications.  Receptionist - Infectious Disease states they are scheduling in to July 2017. 10/19/2015- Orders for PICC line given to [facility]. [facility] for [employer]. [employer] STATES NO PRIOR AUTHORIZATION IS NEEDED FOR PLACEMENT OF PICC LINE.10/19/2015-Orders for PICC line insert faxed to [facility] including [facility] form, written prescription ordering PICC line placement, History and Physical 10/12/2015 including medications and allergies, and pt demographics with statement - NO PRIOR AUTHORIZATION IS NEEDED.  Faxed written order to [facility] for giving initial dose of Ertapenem 1g IV daily x 14 days, and Zyvox 600mg  every 12 hours x 14 days with remaining doses to be given by [facility], History and Physical including medications and allergies, pt demographic.  [facility] will schedule pt, then contact our office and Special Procedure unit with PICC line placement date.  I will then contact [facility] with PICC orders and [facility]. 10/23/2015-[facility] states pt is to report for a 1:15pm  appt and 2:00pm PICC line placement, and around 3:00pm pt will receive the 1st of her IV medications in Special Procedures Unit, please get home health care established.  I informed Lupita Leash [facility] of PICC line placement and orders for IV medications, she states send [facility] referral form, medication instructions and PICC line placement information, H @ P, pt demographic.  I faxed all documents requested to Lupita Leash [facility]. I informed Debbie - [facility] of PICC placement and necessity to order medications and supplies for IV medications Ertapenem and Zyvox for 13 days. Faxed order, rx for both medications and order for labs to be performed by [facility] protocol, H @ P, pt demographics and insurance card copies to [facility]. 10/23/2015-Debbie - [facility] states Zyvox 600mg  needs a Physician Prior approval - [id number] Policy Id# [id number]. 10/24/2015-[employer] FAXED DENIAL OF ZYVOX.  Informed Debbie - [facility], Dr. Marylene Land change Zyvox to Vancomycin 1 gram q12 x14 days. Debbie states send new Vancomycin rx and state pharmacy to dose, have pt set up with [facility] for initial dose. I faxed new Vancomycin rx with pharmacy to dose written on rx. I informed Beth - Special Procedure of change to Vancomycin and need for initial dosing to be given in Special Procedures Unit.  Beth states have pt in by 12:30pm, need orders and rx. I faxed new Vancomycin rx and orders to Special Procedure Unit.  Pt informed of initial dosing to be performed at Special Procedure Unit and pt states will be there. 10/27/2015-[facility]  protocol labs were abnormal and I faxed to Dr. Marylene Land in [facility].  Dr. Marylene Land ordered hold next Vancomycin dose and redraw labs, if labs are normal then may give next dose.  Orders called to [facility] - Lupita Leash, then she transferred me to Rosey Bath [phone] I left a  message with Dr,. Stover's orders and to call to confirm she had received them. Rosey Bath [facility] states [facility] orders changes in medication if labs are abnormal. Rosey Bath states pharmacist stated lab was abnormal, hold morning Vancomycin, may resume evening dose, and weekend doses, and redraw labs 10/30/2015.  I informed Dr. Marylene Land and she agreed, confirmed acceptance of orders with Rosey Bath. 11/03/2015-[facility] [phone] states to have PICC line removed send order to fax [phone], and it will be sent to pt's case nurse to be performed at next visit.

## 2015-10-19 ENCOUNTER — Encounter: Payer: Self-pay | Admitting: Sports Medicine

## 2015-10-19 ENCOUNTER — Ambulatory Visit (INDEPENDENT_AMBULATORY_CARE_PROVIDER_SITE_OTHER): Payer: Medicare Other | Admitting: Sports Medicine

## 2015-10-19 DIAGNOSIS — L97513 Non-pressure chronic ulcer of other part of right foot with necrosis of muscle: Secondary | ICD-10-CM

## 2015-10-19 DIAGNOSIS — Z9889 Other specified postprocedural states: Secondary | ICD-10-CM | POA: Diagnosis not present

## 2015-10-19 DIAGNOSIS — L02611 Cutaneous abscess of right foot: Secondary | ICD-10-CM

## 2015-10-19 DIAGNOSIS — L03031 Cellulitis of right toe: Secondary | ICD-10-CM

## 2015-10-19 DIAGNOSIS — E1142 Type 2 diabetes mellitus with diabetic polyneuropathy: Secondary | ICD-10-CM

## 2015-10-19 NOTE — Progress Notes (Signed)
Patient ID: Emily Miranda, female   DOB: May 19, 1940, 75 y.o.   MRN: [id number]  Subjective: Emily Miranda is a 75 y.o. female patient seen in office for follow up evaluation of ulceration on right foot, POV #5 s/p incision and drainage with bone and tissue culture and hospital admission on 09/08/2015 patient has daily home nursing changing dressings. Reports that she is still having nausea with taking antibiotics. Denies any other issues at this time.   Reports fasting blood sugar has been good.   Patient Active Problem List   Diagnosis Date Noted  . Abdominal pain, RLQ (right lower quadrant) -chronic since 2009 06/02/2013  . Constipation, chronic 06/02/2013  . SUI (stress urinary incontinence, female) 06/02/2013  . Recurrent UTI (urinary tract infection) 06/02/2013  . HTN (hypertension)   . CHEST PAIN 11/27/2009  . OVERWEIGHT 11/28/2008  . ESSENTIAL HYPERTENSION, BENIGN 11/28/2008  . DM 11/25/2008  . AORTIC STENOSIS 11/25/2008  . PANCREATITIS, HX OF 11/25/2008   Current Outpatient Prescriptions on File Prior to Visit  Medication Sig Dispense Refill  . amLODipine (NORVASC) 5 MG tablet Take 5 mg by mouth daily.     Marland Kitchen ampicillin (PRINCIPEN) 500 MG capsule Take 1 capsule (500 mg total) by mouth 4 (four) times daily. 56 capsule 0  . Ascorbic Acid (VITAMIN C) 100 MG tablet Take 100 mg by mouth daily.      . cephALEXin (KEFLEX) 500 MG capsule Take 1 capsule (500 mg total) by mouth 2 (two) times daily. 28 capsule 0  . chlorthalidone (HYGROTON) 25 MG tablet Take 0.5 tablets (12.5 mg total) by mouth daily. 15 tablet 11  . furosemide (LASIX) 20 MG tablet Take 20 mg by mouth daily.     Marland Kitchen glimepiride (AMARYL) 4 MG tablet Take 4 mg by mouth daily with breakfast.    . HYDROcodone-acetaminophen (NORCO) 5-325 MG per tablet Take 1 tablet by mouth every 6 (six) hours as needed. 10 tablet 0  . lisinopril (PRINIVIL,ZESTRIL) 10 MG tablet Take 10 mg by mouth daily.    . methocarbamol (ROBAXIN) 500 MG  tablet Take 1.5 tablets (750 mg total) by mouth every 8 (eight) hours as needed for muscle spasms (use for muscle cramps/pain). 30 tablet 2  . naproxen (NAPROSYN) 500 MG tablet Take 1 tablet (500 mg total) by mouth 2 (two) times daily with a meal. 40 tablet 1  . nitrofurantoin, macrocrystal-monohydrate, (MACROBID) 100 MG capsule Take 100 mg by mouth 2 (two) times daily.     . ONE TOUCH ULTRA TEST test strip 1 each by Other route once a week.     Marland Kitchen Phenyleph-Doxylamine-DM-APAP (ALKA SELTZER PLUS PO) Take 1 tablet by mouth daily as needed (congestion).    . pregabalin (LYRICA) 75 MG capsule Take 1 capsule (75 mg total) by mouth 2 (two) times daily. 60 capsule 1  . sulfamethoxazole-trimethoprim (BACTRIM DS,SEPTRA DS) 800-160 MG tablet Take 1 tablet by mouth 2 (two) times daily. 20 tablet 0  . tetracycline (ACHROMYCIN,SUMYCIN) 500 MG capsule TAKE 1 CAPSULE (500 MG TOTAL) BY MOUTH 2 (TWO) TIMES DAILY. 28 capsule 0  . tetracycline (ACHROMYCIN,SUMYCIN) 500 MG capsule Take 1 capsule (500 mg total) by mouth 2 (two) times daily. 28 capsule 0  . TRADJENTA 5 MG TABS tablet Take 5 mg by mouth daily.     . traMADol (ULTRAM) 50 MG tablet TAKE 1 TABLET BY MOUTH EVERY 8 HOURS AS NEEDED FOR FOOT PAIN 30 tablet 0   Current Facility-Administered Medications on File Prior to Visit  Medication Dose Route Frequency Provider Last Rate Last Dose  . triamcinolone acetonide (KENALOG) 10 MG/ML injection 10 mg  10 mg Other Once Asencion Islam, DPM       Allergies  Allergen Reactions  . Levofloxacin Nausea And Vomiting    Other reaction(s): Confusion    Recent Results (from the past 2160 hour(s))  Wound culture     Status: Abnormal   Collection Time: 09/07/15  4:03 PM  Result Value Ref Range   Gram Stain Result Final report    Result 1 Comment     Comment: No white blood cells seen.   RESULT 2 Comment     Comment: Few gram negative rods.   Aerobic Bacterial Culture Final report (A)    Result 1 Proteus mirabilis  (A)     Comment: Heavy growth   Result 2 Staphylococcus aureus (A)     Comment: Moderate growth Based on resistance to penicillin and susceptibility to oxacillin this isolate would be susceptible to: * Penicillinase-stable penicillins; such as:     Cloxacillin     Dicloxacillin     Nafcillin * Beta-lactam/beta-lactamase inhibitor combinations; such as:     Amoxicillin-clavulanic acid     Ampicillin-sulbactam * Antistaphylococcal cephems; such as:     Cefaclor     Cefuroxime * Antistaphylococcal carbapenems; such as:     Imipenem     Meropenem    ANTIMICROBIAL SUSCEPTIBILITY Comment     Comment:       ** S = Susceptible; I = Intermediate; R = Resistant **                    P = Positive; N = Negative             MICS are expressed in micrograms per mL    Antibiotic                 RSLT#1    RSLT#2    RSLT#3    RSLT#4 Amoxicillin/Clavulanic Acid    S Ampicillin                     S Cefepime                       S Ceftriaxone                    S Cefuroxime                     S Ciprofloxacin                  S         S Clindamycin                              S Ertapenem                      S Erythromycin                             S Gentamicin                     S         S Levofloxacin  S         S Linezolid                                S Moxifloxacin                             S Oxacillin                                S Penicillin                               R Piperacillin                   S Quinupristin/Dalfopristin                S Rifampin                                 S Tetracycline                   R         S Tobr amycin                     S Trimethoprim/Sulfa             R         S Vancomycin                               S     Objective: There were no vitals filed for this visit.  General: Patient is awake, alert, oriented x 3 and in no acute distress.  Dermatology: Skin is warm and dry bilateral with a full thickness  ulceration down to the level of muscle that appears to be filling in measuring 9 cm in length by approximately 4 cm in width at the widest point extending from the plantar medial midfoot all the way to the hallux, status post incision and drainage right foot with moderate granular buds and coverage of underlying bone noted same as previous, there is rolled borders at the wound margins with 1+ pitting edema, no warmth, no erythema, no acute cellulitis, no malodor, no active drainage, however, there is mild serous drainage noted on the inner dressing layers. There is dry blood to be right third toenail with no acute signs of infection unchanged from prior.  Vascular: Dorsalis Pedis pulse = 2/4 Bilateral,  Posterior Tibial pulse = 1/4 Bilateral,  Capillary Fill Time < 5 seconds  Neurologic: Epicritic sensation diminished  to the level of ankle using the 5.07/10g [employer].  Musculosketal:  There is no pain with palpation to the ulceration site on right foot, pes planus foot type with significant talonavicular bulge, right greater than left foot with residual chronic none active Charcot. No pain with compression to calves bilateral.  Strength acceptable for patient status.   Assessment and Plan:  Problem List Items Addressed This Visit    None    Visit Diagnoses    Right foot ulcer, with necrosis of muscle (HCC)    -  Primary    Cellulitis and  abscess of toe, right        S/P foot surgery, right        Diabetic polyneuropathy associated with type 2 diabetes mellitus (HCC)          -Examined patient and discussed the progression of the wound and treatment alternatives. -Cleansed ulceration with antimicrobial spray and then treated hypergranular areas with silver nitrate and packed open with dry gauze and applied dry sterile dressing secured with Ace wrap. Daily Nursing orders continue with antimicrobral wound wash and aquacell AG hydrofiber with silver.  -Continue with  tetracycline 500 mg twice a day and Ampicillin 500 mg every 6 hours;Awaiting PICC line antibiotics Ertapenem and Zyvox and Awaiting ID consultation placed for any additional recommendations since intraoperative cultures revealed actinomycies, enterococcus faecalies, Proteus mirabilis, fungoides, staph aureus. Patient to see ID on 11-13-15 -Current wound culture (10-05-15) [facility] reveals no growth; however, will likely have to reculture area, Once antibiotics are completed to make sure there is no recurrence of infection -Discussed with patient will consider misonix debridement and delayed primary closure when wound cultures are negative and antibiotics are completed -Continue with limited weightbearing for transfers only with post op shoe and with the assistance of a rolling walker. No excessive walking or standing.  -Patient to return to office in 1 week or sooner if problems or issues arise.  Asencion Islam, DPM

## 2015-10-19 NOTE — Telephone Encounter (Signed)
-----   Message from [place], [place] sent at 10/18/2015  9:41 AM EDT ----- Regarding: RE: PICC line Order should read PICC line placement for IV antibiotics for Right foot infection Medications: Ertapenem 1g IV daily x 14 days Zyvox 600mg  IV q 12 hours x 14 days ----- Message -----    From: Marissa Nestle, RN    Sent: 10/18/2015   9:24 AM      To: Asencion Islam, DPM Subject: PICC line                                      Dr. Marylene Land, I have a form from [facility] for PICC line placement and will complete and fax to them. BUT on the off chance they need a doctor's order, can you order so I can see how it need to be written.  And I will need orders for the medications.  Thanks,  [name]

## 2015-10-20 DIAGNOSIS — Z792 Long term (current) use of antibiotics: Secondary | ICD-10-CM | POA: Diagnosis not present

## 2015-10-20 DIAGNOSIS — Z7984 Long term (current) use of oral hypoglycemic drugs: Secondary | ICD-10-CM | POA: Diagnosis not present

## 2015-10-20 DIAGNOSIS — D649 Anemia, unspecified: Secondary | ICD-10-CM | POA: Diagnosis not present

## 2015-10-20 DIAGNOSIS — E1140 Type 2 diabetes mellitus with diabetic neuropathy, unspecified: Secondary | ICD-10-CM | POA: Diagnosis not present

## 2015-10-20 DIAGNOSIS — E11621 Type 2 diabetes mellitus with foot ulcer: Secondary | ICD-10-CM | POA: Diagnosis not present

## 2015-10-20 DIAGNOSIS — Z79891 Long term (current) use of opiate analgesic: Secondary | ICD-10-CM | POA: Diagnosis not present

## 2015-10-20 DIAGNOSIS — I350 Nonrheumatic aortic (valve) stenosis: Secondary | ICD-10-CM | POA: Diagnosis not present

## 2015-10-20 DIAGNOSIS — E1159 Type 2 diabetes mellitus with other circulatory complications: Secondary | ICD-10-CM | POA: Diagnosis not present

## 2015-10-20 DIAGNOSIS — L03115 Cellulitis of right lower limb: Secondary | ICD-10-CM | POA: Diagnosis not present

## 2015-10-20 DIAGNOSIS — L97519 Non-pressure chronic ulcer of other part of right foot with unspecified severity: Secondary | ICD-10-CM | POA: Diagnosis not present

## 2015-10-20 DIAGNOSIS — M1990 Unspecified osteoarthritis, unspecified site: Secondary | ICD-10-CM | POA: Diagnosis not present

## 2015-10-20 DIAGNOSIS — N181 Chronic kidney disease, stage 1: Secondary | ICD-10-CM | POA: Diagnosis not present

## 2015-10-20 DIAGNOSIS — I129 Hypertensive chronic kidney disease with stage 1 through stage 4 chronic kidney disease, or unspecified chronic kidney disease: Secondary | ICD-10-CM | POA: Diagnosis not present

## 2015-10-21 DIAGNOSIS — L97519 Non-pressure chronic ulcer of other part of right foot with unspecified severity: Secondary | ICD-10-CM | POA: Diagnosis not present

## 2015-10-21 DIAGNOSIS — I350 Nonrheumatic aortic (valve) stenosis: Secondary | ICD-10-CM | POA: Diagnosis not present

## 2015-10-21 DIAGNOSIS — N181 Chronic kidney disease, stage 1: Secondary | ICD-10-CM | POA: Diagnosis not present

## 2015-10-21 DIAGNOSIS — L03115 Cellulitis of right lower limb: Secondary | ICD-10-CM | POA: Diagnosis not present

## 2015-10-21 DIAGNOSIS — E1159 Type 2 diabetes mellitus with other circulatory complications: Secondary | ICD-10-CM | POA: Diagnosis not present

## 2015-10-21 DIAGNOSIS — Z7984 Long term (current) use of oral hypoglycemic drugs: Secondary | ICD-10-CM | POA: Diagnosis not present

## 2015-10-21 DIAGNOSIS — M1990 Unspecified osteoarthritis, unspecified site: Secondary | ICD-10-CM | POA: Diagnosis not present

## 2015-10-21 DIAGNOSIS — E1140 Type 2 diabetes mellitus with diabetic neuropathy, unspecified: Secondary | ICD-10-CM | POA: Diagnosis not present

## 2015-10-21 DIAGNOSIS — Z792 Long term (current) use of antibiotics: Secondary | ICD-10-CM | POA: Diagnosis not present

## 2015-10-21 DIAGNOSIS — E11621 Type 2 diabetes mellitus with foot ulcer: Secondary | ICD-10-CM | POA: Diagnosis not present

## 2015-10-21 DIAGNOSIS — I129 Hypertensive chronic kidney disease with stage 1 through stage 4 chronic kidney disease, or unspecified chronic kidney disease: Secondary | ICD-10-CM | POA: Diagnosis not present

## 2015-10-21 DIAGNOSIS — Z79891 Long term (current) use of opiate analgesic: Secondary | ICD-10-CM | POA: Diagnosis not present

## 2015-10-21 DIAGNOSIS — D649 Anemia, unspecified: Secondary | ICD-10-CM | POA: Diagnosis not present

## 2015-10-22 ENCOUNTER — Other Ambulatory Visit: Payer: Self-pay | Admitting: Podiatry

## 2015-10-22 DIAGNOSIS — E11621 Type 2 diabetes mellitus with foot ulcer: Secondary | ICD-10-CM | POA: Diagnosis not present

## 2015-10-22 DIAGNOSIS — I350 Nonrheumatic aortic (valve) stenosis: Secondary | ICD-10-CM | POA: Diagnosis not present

## 2015-10-22 DIAGNOSIS — L03115 Cellulitis of right lower limb: Secondary | ICD-10-CM | POA: Diagnosis not present

## 2015-10-22 DIAGNOSIS — M1990 Unspecified osteoarthritis, unspecified site: Secondary | ICD-10-CM | POA: Diagnosis not present

## 2015-10-22 DIAGNOSIS — Z79891 Long term (current) use of opiate analgesic: Secondary | ICD-10-CM | POA: Diagnosis not present

## 2015-10-22 DIAGNOSIS — N181 Chronic kidney disease, stage 1: Secondary | ICD-10-CM | POA: Diagnosis not present

## 2015-10-22 DIAGNOSIS — E1159 Type 2 diabetes mellitus with other circulatory complications: Secondary | ICD-10-CM | POA: Diagnosis not present

## 2015-10-22 DIAGNOSIS — D649 Anemia, unspecified: Secondary | ICD-10-CM | POA: Diagnosis not present

## 2015-10-22 DIAGNOSIS — I129 Hypertensive chronic kidney disease with stage 1 through stage 4 chronic kidney disease, or unspecified chronic kidney disease: Secondary | ICD-10-CM | POA: Diagnosis not present

## 2015-10-22 DIAGNOSIS — Z7984 Long term (current) use of oral hypoglycemic drugs: Secondary | ICD-10-CM | POA: Diagnosis not present

## 2015-10-22 DIAGNOSIS — Z792 Long term (current) use of antibiotics: Secondary | ICD-10-CM | POA: Diagnosis not present

## 2015-10-22 DIAGNOSIS — E1140 Type 2 diabetes mellitus with diabetic neuropathy, unspecified: Secondary | ICD-10-CM | POA: Diagnosis not present

## 2015-10-22 DIAGNOSIS — L97519 Non-pressure chronic ulcer of other part of right foot with unspecified severity: Secondary | ICD-10-CM | POA: Diagnosis not present

## 2015-10-23 ENCOUNTER — Encounter: Payer: Self-pay | Admitting: Sports Medicine

## 2015-10-23 DIAGNOSIS — E11621 Type 2 diabetes mellitus with foot ulcer: Secondary | ICD-10-CM | POA: Diagnosis not present

## 2015-10-23 DIAGNOSIS — L97513 Non-pressure chronic ulcer of other part of right foot with necrosis of muscle: Secondary | ICD-10-CM | POA: Diagnosis not present

## 2015-10-23 DIAGNOSIS — Z7984 Long term (current) use of oral hypoglycemic drugs: Secondary | ICD-10-CM | POA: Diagnosis not present

## 2015-10-23 DIAGNOSIS — N181 Chronic kidney disease, stage 1: Secondary | ICD-10-CM | POA: Diagnosis not present

## 2015-10-23 DIAGNOSIS — I129 Hypertensive chronic kidney disease with stage 1 through stage 4 chronic kidney disease, or unspecified chronic kidney disease: Secondary | ICD-10-CM | POA: Diagnosis not present

## 2015-10-23 DIAGNOSIS — Z79891 Long term (current) use of opiate analgesic: Secondary | ICD-10-CM | POA: Diagnosis not present

## 2015-10-23 DIAGNOSIS — E1140 Type 2 diabetes mellitus with diabetic neuropathy, unspecified: Secondary | ICD-10-CM | POA: Diagnosis not present

## 2015-10-23 DIAGNOSIS — I350 Nonrheumatic aortic (valve) stenosis: Secondary | ICD-10-CM | POA: Diagnosis not present

## 2015-10-23 DIAGNOSIS — D649 Anemia, unspecified: Secondary | ICD-10-CM | POA: Diagnosis not present

## 2015-10-23 DIAGNOSIS — M1990 Unspecified osteoarthritis, unspecified site: Secondary | ICD-10-CM | POA: Diagnosis not present

## 2015-10-23 DIAGNOSIS — E1159 Type 2 diabetes mellitus with other circulatory complications: Secondary | ICD-10-CM | POA: Diagnosis not present

## 2015-10-23 DIAGNOSIS — Z452 Encounter for adjustment and management of vascular access device: Secondary | ICD-10-CM | POA: Diagnosis not present

## 2015-10-23 DIAGNOSIS — Z792 Long term (current) use of antibiotics: Secondary | ICD-10-CM | POA: Diagnosis not present

## 2015-10-23 DIAGNOSIS — L97519 Non-pressure chronic ulcer of other part of right foot with unspecified severity: Secondary | ICD-10-CM | POA: Diagnosis not present

## 2015-10-23 DIAGNOSIS — R1031 Right lower quadrant pain: Secondary | ICD-10-CM | POA: Diagnosis not present

## 2015-10-23 DIAGNOSIS — L03115 Cellulitis of right lower limb: Secondary | ICD-10-CM | POA: Diagnosis not present

## 2015-10-23 MED ORDER — ERTAPENEM SODIUM 1 G IJ SOLR: 1.0000 g | INTRAMUSCULAR | Status: DC

## 2015-10-23 MED ORDER — LINEZOLID 600 MG/300ML IV SOLN: 600.0000 mg | Freq: Two times a day (BID) | INTRAVENOUS | Status: DC

## 2015-10-23 NOTE — Telephone Encounter (Signed)
Called. Auth will be faxed within 24 hours. The representative named Thayer Ohm marked it Urgent but said since Zyvox is a specialty medication it has to be reviewed and that we will receive a fax within 24 hours -Dr. Marylene Land

## 2015-10-24 DIAGNOSIS — R1031 Right lower quadrant pain: Secondary | ICD-10-CM | POA: Diagnosis not present

## 2015-10-24 DIAGNOSIS — L97519 Non-pressure chronic ulcer of other part of right foot with unspecified severity: Secondary | ICD-10-CM | POA: Diagnosis not present

## 2015-10-24 DIAGNOSIS — Z792 Long term (current) use of antibiotics: Secondary | ICD-10-CM | POA: Diagnosis not present

## 2015-10-24 DIAGNOSIS — Z79891 Long term (current) use of opiate analgesic: Secondary | ICD-10-CM | POA: Diagnosis not present

## 2015-10-24 DIAGNOSIS — E11621 Type 2 diabetes mellitus with foot ulcer: Secondary | ICD-10-CM | POA: Diagnosis not present

## 2015-10-24 DIAGNOSIS — L02611 Cutaneous abscess of right foot: Secondary | ICD-10-CM | POA: Diagnosis not present

## 2015-10-24 DIAGNOSIS — D649 Anemia, unspecified: Secondary | ICD-10-CM | POA: Diagnosis not present

## 2015-10-24 DIAGNOSIS — I350 Nonrheumatic aortic (valve) stenosis: Secondary | ICD-10-CM | POA: Diagnosis not present

## 2015-10-24 DIAGNOSIS — L03115 Cellulitis of right lower limb: Secondary | ICD-10-CM | POA: Diagnosis not present

## 2015-10-24 DIAGNOSIS — L97513 Non-pressure chronic ulcer of other part of right foot with necrosis of muscle: Secondary | ICD-10-CM | POA: Diagnosis not present

## 2015-10-24 DIAGNOSIS — Z7984 Long term (current) use of oral hypoglycemic drugs: Secondary | ICD-10-CM | POA: Diagnosis not present

## 2015-10-24 DIAGNOSIS — I129 Hypertensive chronic kidney disease with stage 1 through stage 4 chronic kidney disease, or unspecified chronic kidney disease: Secondary | ICD-10-CM | POA: Diagnosis not present

## 2015-10-24 DIAGNOSIS — N181 Chronic kidney disease, stage 1: Secondary | ICD-10-CM | POA: Diagnosis not present

## 2015-10-24 DIAGNOSIS — M1990 Unspecified osteoarthritis, unspecified site: Secondary | ICD-10-CM | POA: Diagnosis not present

## 2015-10-24 DIAGNOSIS — E1140 Type 2 diabetes mellitus with diabetic neuropathy, unspecified: Secondary | ICD-10-CM | POA: Diagnosis not present

## 2015-10-24 DIAGNOSIS — E1159 Type 2 diabetes mellitus with other circulatory complications: Secondary | ICD-10-CM | POA: Diagnosis not present

## 2015-10-24 NOTE — Telephone Encounter (Signed)
-----   Message from Asencion Islam, [place] sent at 10/24/2015  9:38 AM EDT ----- Regarding: Alternative antibiotic to Zyvox For her Antibiotics Alternative Vanco 1gram IV q12h x 14 days Will need to order a vanco level as well Thanks Dr. Marylene Land  ----- Message -----    From: Marissa Nestle, RN    Sent: 10/19/2015   1:52 PM      To: Asencion Islam, DPM  Dr. Marylene Land, Pt is schedule 11/13/2015 with Infectious Disease.  Delydia has the surgical order sheet to Prior Authorize the PICC surgery.  I've called [facility]  (1st dosing of each medication is to be given in Hospital) and I have all the information they need ready to go once approved.  [facility] and [facility] will be contacted with orders once Approval is received. [name] ----- Message -----    From: Asencion Islam, DPM    Sent: 10/18/2015   5:09 PM      To: Marissa Nestle, RN  The sooner we can get her to ID the better however if its several weeks before she can get an appointment then I want the PICC place so she can be getting IV antibiotics in the meantime. Thanks Dr Marylene Land ----- Message -----    From: Marissa Nestle, RN    Sent: 10/18/2015   3:17 PM      To: Asencion Islam, DPM  Dr. Marylene Land, if pt is going to Infectious Disease, that may be the quickest and easiest all around to get that PICC line.  It may be a straight hospital referral/order to Interventional Radiology. [name]

## 2015-10-25 DIAGNOSIS — Z79891 Long term (current) use of opiate analgesic: Secondary | ICD-10-CM | POA: Diagnosis not present

## 2015-10-25 DIAGNOSIS — I129 Hypertensive chronic kidney disease with stage 1 through stage 4 chronic kidney disease, or unspecified chronic kidney disease: Secondary | ICD-10-CM | POA: Diagnosis not present

## 2015-10-25 DIAGNOSIS — L97519 Non-pressure chronic ulcer of other part of right foot with unspecified severity: Secondary | ICD-10-CM | POA: Diagnosis not present

## 2015-10-25 DIAGNOSIS — E11621 Type 2 diabetes mellitus with foot ulcer: Secondary | ICD-10-CM | POA: Diagnosis not present

## 2015-10-25 DIAGNOSIS — M1990 Unspecified osteoarthritis, unspecified site: Secondary | ICD-10-CM | POA: Diagnosis not present

## 2015-10-25 DIAGNOSIS — E1140 Type 2 diabetes mellitus with diabetic neuropathy, unspecified: Secondary | ICD-10-CM | POA: Diagnosis not present

## 2015-10-25 DIAGNOSIS — Z792 Long term (current) use of antibiotics: Secondary | ICD-10-CM | POA: Diagnosis not present

## 2015-10-25 DIAGNOSIS — E1159 Type 2 diabetes mellitus with other circulatory complications: Secondary | ICD-10-CM | POA: Diagnosis not present

## 2015-10-25 DIAGNOSIS — N181 Chronic kidney disease, stage 1: Secondary | ICD-10-CM | POA: Diagnosis not present

## 2015-10-25 DIAGNOSIS — D649 Anemia, unspecified: Secondary | ICD-10-CM | POA: Diagnosis not present

## 2015-10-25 DIAGNOSIS — Z7984 Long term (current) use of oral hypoglycemic drugs: Secondary | ICD-10-CM | POA: Diagnosis not present

## 2015-10-25 DIAGNOSIS — L03115 Cellulitis of right lower limb: Secondary | ICD-10-CM | POA: Diagnosis not present

## 2015-10-25 DIAGNOSIS — I350 Nonrheumatic aortic (valve) stenosis: Secondary | ICD-10-CM | POA: Diagnosis not present

## 2015-10-26 ENCOUNTER — Encounter: Payer: Self-pay | Admitting: Sports Medicine

## 2015-10-26 ENCOUNTER — Ambulatory Visit (INDEPENDENT_AMBULATORY_CARE_PROVIDER_SITE_OTHER): Payer: Medicare Other | Admitting: Sports Medicine

## 2015-10-26 DIAGNOSIS — E1142 Type 2 diabetes mellitus with diabetic polyneuropathy: Secondary | ICD-10-CM

## 2015-10-26 DIAGNOSIS — I350 Nonrheumatic aortic (valve) stenosis: Secondary | ICD-10-CM | POA: Diagnosis not present

## 2015-10-26 DIAGNOSIS — L03031 Cellulitis of right toe: Secondary | ICD-10-CM

## 2015-10-26 DIAGNOSIS — L97513 Non-pressure chronic ulcer of other part of right foot with necrosis of muscle: Secondary | ICD-10-CM | POA: Diagnosis not present

## 2015-10-26 DIAGNOSIS — L97519 Non-pressure chronic ulcer of other part of right foot with unspecified severity: Secondary | ICD-10-CM | POA: Diagnosis not present

## 2015-10-26 DIAGNOSIS — Z9889 Other specified postprocedural states: Secondary | ICD-10-CM

## 2015-10-26 DIAGNOSIS — Z79891 Long term (current) use of opiate analgesic: Secondary | ICD-10-CM | POA: Diagnosis not present

## 2015-10-26 DIAGNOSIS — M1990 Unspecified osteoarthritis, unspecified site: Secondary | ICD-10-CM | POA: Diagnosis not present

## 2015-10-26 DIAGNOSIS — Z7984 Long term (current) use of oral hypoglycemic drugs: Secondary | ICD-10-CM | POA: Diagnosis not present

## 2015-10-26 DIAGNOSIS — M79671 Pain in right foot: Secondary | ICD-10-CM

## 2015-10-26 DIAGNOSIS — N181 Chronic kidney disease, stage 1: Secondary | ICD-10-CM | POA: Diagnosis not present

## 2015-10-26 DIAGNOSIS — E1159 Type 2 diabetes mellitus with other circulatory complications: Secondary | ICD-10-CM | POA: Diagnosis not present

## 2015-10-26 DIAGNOSIS — Z792 Long term (current) use of antibiotics: Secondary | ICD-10-CM | POA: Diagnosis not present

## 2015-10-26 DIAGNOSIS — K521 Toxic gastroenteritis and colitis: Secondary | ICD-10-CM

## 2015-10-26 DIAGNOSIS — L03115 Cellulitis of right lower limb: Secondary | ICD-10-CM | POA: Diagnosis not present

## 2015-10-26 DIAGNOSIS — L02611 Cutaneous abscess of right foot: Secondary | ICD-10-CM | POA: Diagnosis not present

## 2015-10-26 DIAGNOSIS — D649 Anemia, unspecified: Secondary | ICD-10-CM | POA: Diagnosis not present

## 2015-10-26 DIAGNOSIS — E1140 Type 2 diabetes mellitus with diabetic neuropathy, unspecified: Secondary | ICD-10-CM | POA: Diagnosis not present

## 2015-10-26 DIAGNOSIS — E11621 Type 2 diabetes mellitus with foot ulcer: Secondary | ICD-10-CM | POA: Diagnosis not present

## 2015-10-26 DIAGNOSIS — I129 Hypertensive chronic kidney disease with stage 1 through stage 4 chronic kidney disease, or unspecified chronic kidney disease: Secondary | ICD-10-CM | POA: Diagnosis not present

## 2015-10-26 MED ORDER — DIPHENOXYLATE-ATROPINE 2.5-0.025 MG PO TABS: 1.0000 | ORAL_TABLET | Freq: Four times a day (QID) | ORAL | Status: DC | PRN

## 2015-10-26 NOTE — Progress Notes (Signed)
Patient ID: Emily Miranda, female   DOB: 05/19/1940, 75 y.o.   MRN: [id number]   Subjective: Emily Miranda is a 75 y.o. female patient seen in office for follow up evaluation of ulceration on right foot, POV #6 s/p incision and drainage with bone and tissue culture and hospital admission on 09/08/2015 patient has daily home nursing changing dressings and now has started PICC line antibiotics since yesterday 10-25-15 on Ertapenem and Vancomycin. Reports that she has not had nausea but has had 1 episode of diarrhea. Denies any other issues at this time.   Reports fasting blood sugar has been good.   Patient Active Problem List   Diagnosis Date Noted  . Abdominal pain, RLQ (right lower quadrant) -chronic since 2009 06/02/2013  . Constipation, chronic 06/02/2013  . SUI (stress urinary incontinence, female) 06/02/2013  . Recurrent UTI (urinary tract infection) 06/02/2013  . HTN (hypertension)   . CHEST PAIN 11/27/2009  . OVERWEIGHT 11/28/2008  . ESSENTIAL HYPERTENSION, BENIGN 11/28/2008  . DM 11/25/2008  . AORTIC STENOSIS 11/25/2008  . PANCREATITIS, HX OF 11/25/2008   Current Outpatient Prescriptions on File Prior to Visit  Medication Sig Dispense Refill  . amLODipine (NORVASC) 5 MG tablet Take 5 mg by mouth daily.     Marland Kitchen ampicillin (PRINCIPEN) 500 MG capsule Take 1 capsule (500 mg total) by mouth 4 (four) times daily. 56 capsule 0  . Ascorbic Acid (VITAMIN C) 100 MG tablet Take 100 mg by mouth daily.      . cephALEXin (KEFLEX) 500 MG capsule Take 1 capsule (500 mg total) by mouth 2 (two) times daily. 28 capsule 0  . chlorthalidone (HYGROTON) 25 MG tablet Take 0.5 tablets (12.5 mg total) by mouth daily. 15 tablet 11  . ertapenem 1 g in sodium chloride 0.9 % 50 mL Inject 1 g into the vein daily. 13 Syringe 0  . furosemide (LASIX) 20 MG tablet Take 20 mg by mouth daily.     Marland Kitchen glimepiride (AMARYL) 4 MG tablet Take 4 mg by mouth daily with breakfast.    . HYDROcodone-acetaminophen (NORCO)  5-325 MG per tablet Take 1 tablet by mouth every 6 (six) hours as needed. 10 tablet 0  . linezolid (ZYVOX) 600 MG/300ML IVPB Inject 300 mLs (600 mg total) into the vein every 12 (twelve) hours. 100 mL 23  . lisinopril (PRINIVIL,ZESTRIL) 10 MG tablet Take 10 mg by mouth daily.    . methocarbamol (ROBAXIN) 500 MG tablet Take 1.5 tablets (750 mg total) by mouth every 8 (eight) hours as needed for muscle spasms (use for muscle cramps/pain). 30 tablet 2  . naproxen (NAPROSYN) 500 MG tablet Take 1 tablet (500 mg total) by mouth 2 (two) times daily with a meal. 40 tablet 1  . nitrofurantoin, macrocrystal-monohydrate, (MACROBID) 100 MG capsule Take 100 mg by mouth 2 (two) times daily.     . ONE TOUCH ULTRA TEST test strip 1 each by Other route once a week.     Marland Kitchen Phenyleph-Doxylamine-DM-APAP (ALKA SELTZER PLUS PO) Take 1 tablet by mouth daily as needed (congestion).    . pregabalin (LYRICA) 75 MG capsule Take 1 capsule (75 mg total) by mouth 2 (two) times daily. 60 capsule 1  . sulfamethoxazole-trimethoprim (BACTRIM DS,SEPTRA DS) 800-160 MG tablet Take 1 tablet by mouth 2 (two) times daily. 20 tablet 0  . tetracycline (ACHROMYCIN,SUMYCIN) 500 MG capsule TAKE 1 CAPSULE (500 MG TOTAL) BY MOUTH 2 (TWO) TIMES DAILY. 28 capsule 0  . tetracycline (ACHROMYCIN,SUMYCIN) 500 MG capsule  Take 1 capsule (500 mg total) by mouth 2 (two) times daily. 28 capsule 0  . TRADJENTA 5 MG TABS tablet Take 5 mg by mouth daily.     . traMADol (ULTRAM) 50 MG tablet TAKE 1 TABLET BY MOUTH EVERY 8 HOURS AS NEEDED FOR FOOT PAIN 30 tablet 0   Current Facility-Administered Medications on File Prior to Visit  Medication Dose Route Frequency Provider Last Rate Last Dose  . triamcinolone acetonide (KENALOG) 10 MG/ML injection 10 mg  10 mg Other Once Asencion Islam, DPM       Allergies  Allergen Reactions  . Levofloxacin Nausea And Vomiting    Other reaction(s): Confusion    Recent Results (from the past 2160 hour(s))  Wound culture      Status: Abnormal   Collection Time: 09/07/15  4:03 PM  Result Value Ref Range   Gram Stain Result Final report    Result 1 Comment     Comment: No white blood cells seen.   RESULT 2 Comment     Comment: Few gram negative rods.   Aerobic Bacterial Culture Final report (A)    Result 1 Proteus mirabilis (A)     Comment: Heavy growth   Result 2 Staphylococcus aureus (A)     Comment: Moderate growth Based on resistance to penicillin and susceptibility to oxacillin this isolate would be susceptible to: * Penicillinase-stable penicillins; such as:     Cloxacillin     Dicloxacillin     Nafcillin * Beta-lactam/beta-lactamase inhibitor combinations; such as:     Amoxicillin-clavulanic acid     Ampicillin-sulbactam * Antistaphylococcal cephems; such as:     Cefaclor     Cefuroxime * Antistaphylococcal carbapenems; such as:     Imipenem     Meropenem    ANTIMICROBIAL SUSCEPTIBILITY Comment     Comment:       ** S = Susceptible; I = Intermediate; R = Resistant **                    P = Positive; N = Negative             MICS are expressed in micrograms per mL    Antibiotic                 RSLT#1    RSLT#2    RSLT#3    RSLT#4 Amoxicillin/Clavulanic Acid    S Ampicillin                     S Cefepime                       S Ceftriaxone                    S Cefuroxime                     S Ciprofloxacin                  S         S Clindamycin                              S Ertapenem                      S Erythromycin  S Gentamicin                     S         S Levofloxacin                   S         S Linezolid                                S Moxifloxacin                             S Oxacillin                                S Penicillin                               R Piperacillin                   S Quinupristin/Dalfopristin                S Rifampin                                 S Tetracycline                   R         S Tobr amycin                      S Trimethoprim/Sulfa             R         S Vancomycin                               S     Objective: There were no vitals filed for this visit.  General: Patient is awake, alert, oriented x 3 and in no acute distress.  Dermatology: Skin is warm and dry bilateral with a full thickness ulceration down to the level of muscle that appears to be filling in measuring 8.5 cm in length by approximately 4 cm in width at the widest point extending from the plantar medial midfoot all the way to the hallux, status post incision and drainage right foot with moderate granular buds and coverage of underlying bone noted same as previous, there is rolled borders at the wound margins with 1+ pitting edema, no warmth, no erythema, no acute cellulitis, no malodor, no active drainage, however, there is mild serous drainage noted on the inner dressing layers. There is dry blood to be right third toenail with no acute signs of infection unchanged from prior.  Vascular: Dorsalis Pedis pulse = 2/4 Bilateral,  Posterior Tibial pulse = 1/4 Bilateral,  Capillary Fill Time < 5 seconds  Neurologic: Epicritic sensation diminished  to the level of ankle using the 5.07/10g [employer].  Musculosketal:  There is no pain with palpation to the ulceration site on right foot, pes planus foot type with significant talonavicular bulge, right greater than left foot with residual chronic none active Charcot. No pain with compression to  calves bilateral.  Strength acceptable for patient status.   Assessment and Plan:  Problem List Items Addressed This Visit    None    Visit Diagnoses    Right foot ulcer, with necrosis of muscle (HCC)    -  Primary    Cellulitis and abscess of toe, right        Resolved    S/P foot surgery, right        Diabetic polyneuropathy associated with type 2 diabetes mellitus (HCC)        Right foot pain        Diarrhea due to drug        Relevant Medications     diphenoxylate-atropine (LOMOTIL) 2.5-0.025 MG tablet      -Examined patient and discussed the progression of the wound and treatment alternatives. -Cleansed ulceration with antimicrobial spray and then treated hypergranular areas with silver nitrate and packed open with dry gauze and applied dry sterile dressing secured with Ace wrap. Daily Nursing orders continue with antimicrobral wound wash and aquacell AG hydrofiber with silver.  -Continue PICC line antibiotics Ertapenem and Vanco to finish 11-08-15. Awaiting ID consultation which was placed for any additional recommendations since intraoperative cultures revealed actinomycies, enterococcus faecalies, Proteus mirabilis, fungoides, staph aureus. Patient to see ID on 11-13-15 -Current wound culture (10-05-15) [facility] reveals no growth; however, will likely have to reculture area, Once antibiotics are completed to make sure there is no recurrence of infection -Discussed with patient will consider misonix debridement and delayed primary closure when wound cultures are negative and antibiotics are completed -Continue with limited weightbearing for transfers only with post op shoe and with the assistance of a rolling walker. No excessive walking or standing.  -Rx Lomitil to take as needed for Diarrhea. Advised patient if continues will have to get stool test done to check for C. Diff -Patient to return to office in 1 week or sooner if problems or issues arise.  Asencion Islam, DPM

## 2015-10-27 DIAGNOSIS — T82838A Hemorrhage of vascular prosthetic devices, implants and grafts, initial encounter: Secondary | ICD-10-CM | POA: Diagnosis not present

## 2015-10-27 DIAGNOSIS — N181 Chronic kidney disease, stage 1: Secondary | ICD-10-CM | POA: Diagnosis not present

## 2015-10-27 DIAGNOSIS — Z7984 Long term (current) use of oral hypoglycemic drugs: Secondary | ICD-10-CM | POA: Diagnosis not present

## 2015-10-27 DIAGNOSIS — E1159 Type 2 diabetes mellitus with other circulatory complications: Secondary | ICD-10-CM | POA: Diagnosis not present

## 2015-10-27 DIAGNOSIS — D649 Anemia, unspecified: Secondary | ICD-10-CM | POA: Diagnosis not present

## 2015-10-27 DIAGNOSIS — E1140 Type 2 diabetes mellitus with diabetic neuropathy, unspecified: Secondary | ICD-10-CM | POA: Diagnosis not present

## 2015-10-27 DIAGNOSIS — Z792 Long term (current) use of antibiotics: Secondary | ICD-10-CM | POA: Diagnosis not present

## 2015-10-27 DIAGNOSIS — I350 Nonrheumatic aortic (valve) stenosis: Secondary | ICD-10-CM | POA: Diagnosis not present

## 2015-10-27 DIAGNOSIS — E11621 Type 2 diabetes mellitus with foot ulcer: Secondary | ICD-10-CM | POA: Diagnosis not present

## 2015-10-27 DIAGNOSIS — L03115 Cellulitis of right lower limb: Secondary | ICD-10-CM | POA: Diagnosis not present

## 2015-10-27 DIAGNOSIS — Z79891 Long term (current) use of opiate analgesic: Secondary | ICD-10-CM | POA: Diagnosis not present

## 2015-10-27 DIAGNOSIS — I129 Hypertensive chronic kidney disease with stage 1 through stage 4 chronic kidney disease, or unspecified chronic kidney disease: Secondary | ICD-10-CM | POA: Diagnosis not present

## 2015-10-27 DIAGNOSIS — L97519 Non-pressure chronic ulcer of other part of right foot with unspecified severity: Secondary | ICD-10-CM | POA: Diagnosis not present

## 2015-10-27 DIAGNOSIS — M1990 Unspecified osteoarthritis, unspecified site: Secondary | ICD-10-CM | POA: Diagnosis not present

## 2015-10-28 DIAGNOSIS — E1159 Type 2 diabetes mellitus with other circulatory complications: Secondary | ICD-10-CM | POA: Diagnosis not present

## 2015-10-28 DIAGNOSIS — E11621 Type 2 diabetes mellitus with foot ulcer: Secondary | ICD-10-CM | POA: Diagnosis not present

## 2015-10-28 DIAGNOSIS — I350 Nonrheumatic aortic (valve) stenosis: Secondary | ICD-10-CM | POA: Diagnosis not present

## 2015-10-28 DIAGNOSIS — E1140 Type 2 diabetes mellitus with diabetic neuropathy, unspecified: Secondary | ICD-10-CM | POA: Diagnosis not present

## 2015-10-28 DIAGNOSIS — Z7984 Long term (current) use of oral hypoglycemic drugs: Secondary | ICD-10-CM | POA: Diagnosis not present

## 2015-10-28 DIAGNOSIS — L03115 Cellulitis of right lower limb: Secondary | ICD-10-CM | POA: Diagnosis not present

## 2015-10-28 DIAGNOSIS — M1990 Unspecified osteoarthritis, unspecified site: Secondary | ICD-10-CM | POA: Diagnosis not present

## 2015-10-28 DIAGNOSIS — L97519 Non-pressure chronic ulcer of other part of right foot with unspecified severity: Secondary | ICD-10-CM | POA: Diagnosis not present

## 2015-10-28 DIAGNOSIS — N181 Chronic kidney disease, stage 1: Secondary | ICD-10-CM | POA: Diagnosis not present

## 2015-10-28 DIAGNOSIS — D649 Anemia, unspecified: Secondary | ICD-10-CM | POA: Diagnosis not present

## 2015-10-28 DIAGNOSIS — I129 Hypertensive chronic kidney disease with stage 1 through stage 4 chronic kidney disease, or unspecified chronic kidney disease: Secondary | ICD-10-CM | POA: Diagnosis not present

## 2015-10-28 DIAGNOSIS — Z792 Long term (current) use of antibiotics: Secondary | ICD-10-CM | POA: Diagnosis not present

## 2015-10-28 DIAGNOSIS — Z79891 Long term (current) use of opiate analgesic: Secondary | ICD-10-CM | POA: Diagnosis not present

## 2015-10-29 DIAGNOSIS — M1990 Unspecified osteoarthritis, unspecified site: Secondary | ICD-10-CM | POA: Diagnosis not present

## 2015-10-29 DIAGNOSIS — L03115 Cellulitis of right lower limb: Secondary | ICD-10-CM | POA: Diagnosis not present

## 2015-10-29 DIAGNOSIS — N181 Chronic kidney disease, stage 1: Secondary | ICD-10-CM | POA: Diagnosis not present

## 2015-10-29 DIAGNOSIS — L97519 Non-pressure chronic ulcer of other part of right foot with unspecified severity: Secondary | ICD-10-CM | POA: Diagnosis not present

## 2015-10-29 DIAGNOSIS — Z79891 Long term (current) use of opiate analgesic: Secondary | ICD-10-CM | POA: Diagnosis not present

## 2015-10-29 DIAGNOSIS — E1140 Type 2 diabetes mellitus with diabetic neuropathy, unspecified: Secondary | ICD-10-CM | POA: Diagnosis not present

## 2015-10-29 DIAGNOSIS — D649 Anemia, unspecified: Secondary | ICD-10-CM | POA: Diagnosis not present

## 2015-10-29 DIAGNOSIS — E11621 Type 2 diabetes mellitus with foot ulcer: Secondary | ICD-10-CM | POA: Diagnosis not present

## 2015-10-29 DIAGNOSIS — Z792 Long term (current) use of antibiotics: Secondary | ICD-10-CM | POA: Diagnosis not present

## 2015-10-29 DIAGNOSIS — I350 Nonrheumatic aortic (valve) stenosis: Secondary | ICD-10-CM | POA: Diagnosis not present

## 2015-10-29 DIAGNOSIS — E1159 Type 2 diabetes mellitus with other circulatory complications: Secondary | ICD-10-CM | POA: Diagnosis not present

## 2015-10-29 DIAGNOSIS — I129 Hypertensive chronic kidney disease with stage 1 through stage 4 chronic kidney disease, or unspecified chronic kidney disease: Secondary | ICD-10-CM | POA: Diagnosis not present

## 2015-10-29 DIAGNOSIS — Z7984 Long term (current) use of oral hypoglycemic drugs: Secondary | ICD-10-CM | POA: Diagnosis not present

## 2015-10-30 ENCOUNTER — Other Ambulatory Visit: Payer: Self-pay | Admitting: Sports Medicine

## 2015-10-30 DIAGNOSIS — L03115 Cellulitis of right lower limb: Secondary | ICD-10-CM | POA: Diagnosis not present

## 2015-10-30 DIAGNOSIS — I129 Hypertensive chronic kidney disease with stage 1 through stage 4 chronic kidney disease, or unspecified chronic kidney disease: Secondary | ICD-10-CM | POA: Diagnosis not present

## 2015-10-30 DIAGNOSIS — Z7984 Long term (current) use of oral hypoglycemic drugs: Secondary | ICD-10-CM | POA: Diagnosis not present

## 2015-10-30 DIAGNOSIS — Z792 Long term (current) use of antibiotics: Secondary | ICD-10-CM | POA: Diagnosis not present

## 2015-10-30 DIAGNOSIS — I350 Nonrheumatic aortic (valve) stenosis: Secondary | ICD-10-CM | POA: Diagnosis not present

## 2015-10-30 DIAGNOSIS — Z79891 Long term (current) use of opiate analgesic: Secondary | ICD-10-CM | POA: Diagnosis not present

## 2015-10-30 DIAGNOSIS — N181 Chronic kidney disease, stage 1: Secondary | ICD-10-CM | POA: Diagnosis not present

## 2015-10-30 DIAGNOSIS — E1140 Type 2 diabetes mellitus with diabetic neuropathy, unspecified: Secondary | ICD-10-CM | POA: Diagnosis not present

## 2015-10-30 DIAGNOSIS — L97519 Non-pressure chronic ulcer of other part of right foot with unspecified severity: Secondary | ICD-10-CM | POA: Diagnosis not present

## 2015-10-30 DIAGNOSIS — M1990 Unspecified osteoarthritis, unspecified site: Secondary | ICD-10-CM | POA: Diagnosis not present

## 2015-10-30 DIAGNOSIS — E11621 Type 2 diabetes mellitus with foot ulcer: Secondary | ICD-10-CM | POA: Diagnosis not present

## 2015-10-30 DIAGNOSIS — D649 Anemia, unspecified: Secondary | ICD-10-CM | POA: Diagnosis not present

## 2015-10-30 DIAGNOSIS — E1159 Type 2 diabetes mellitus with other circulatory complications: Secondary | ICD-10-CM | POA: Diagnosis not present

## 2015-10-31 DIAGNOSIS — I350 Nonrheumatic aortic (valve) stenosis: Secondary | ICD-10-CM | POA: Diagnosis not present

## 2015-10-31 DIAGNOSIS — I129 Hypertensive chronic kidney disease with stage 1 through stage 4 chronic kidney disease, or unspecified chronic kidney disease: Secondary | ICD-10-CM | POA: Diagnosis not present

## 2015-10-31 DIAGNOSIS — L97519 Non-pressure chronic ulcer of other part of right foot with unspecified severity: Secondary | ICD-10-CM | POA: Diagnosis not present

## 2015-10-31 DIAGNOSIS — E11621 Type 2 diabetes mellitus with foot ulcer: Secondary | ICD-10-CM | POA: Diagnosis not present

## 2015-10-31 DIAGNOSIS — D649 Anemia, unspecified: Secondary | ICD-10-CM | POA: Diagnosis not present

## 2015-10-31 DIAGNOSIS — M1990 Unspecified osteoarthritis, unspecified site: Secondary | ICD-10-CM | POA: Diagnosis not present

## 2015-10-31 DIAGNOSIS — N181 Chronic kidney disease, stage 1: Secondary | ICD-10-CM | POA: Diagnosis not present

## 2015-10-31 DIAGNOSIS — L03115 Cellulitis of right lower limb: Secondary | ICD-10-CM | POA: Diagnosis not present

## 2015-10-31 DIAGNOSIS — Z7984 Long term (current) use of oral hypoglycemic drugs: Secondary | ICD-10-CM | POA: Diagnosis not present

## 2015-10-31 DIAGNOSIS — Z792 Long term (current) use of antibiotics: Secondary | ICD-10-CM | POA: Diagnosis not present

## 2015-10-31 DIAGNOSIS — E1140 Type 2 diabetes mellitus with diabetic neuropathy, unspecified: Secondary | ICD-10-CM | POA: Diagnosis not present

## 2015-10-31 DIAGNOSIS — Z79891 Long term (current) use of opiate analgesic: Secondary | ICD-10-CM | POA: Diagnosis not present

## 2015-10-31 DIAGNOSIS — E1159 Type 2 diabetes mellitus with other circulatory complications: Secondary | ICD-10-CM | POA: Diagnosis not present

## 2015-11-01 DIAGNOSIS — N181 Chronic kidney disease, stage 1: Secondary | ICD-10-CM | POA: Diagnosis not present

## 2015-11-01 DIAGNOSIS — M1990 Unspecified osteoarthritis, unspecified site: Secondary | ICD-10-CM | POA: Diagnosis not present

## 2015-11-01 DIAGNOSIS — E1159 Type 2 diabetes mellitus with other circulatory complications: Secondary | ICD-10-CM | POA: Diagnosis not present

## 2015-11-01 DIAGNOSIS — D649 Anemia, unspecified: Secondary | ICD-10-CM | POA: Diagnosis not present

## 2015-11-01 DIAGNOSIS — Z7984 Long term (current) use of oral hypoglycemic drugs: Secondary | ICD-10-CM | POA: Diagnosis not present

## 2015-11-01 DIAGNOSIS — Z792 Long term (current) use of antibiotics: Secondary | ICD-10-CM | POA: Diagnosis not present

## 2015-11-01 DIAGNOSIS — L03115 Cellulitis of right lower limb: Secondary | ICD-10-CM | POA: Diagnosis not present

## 2015-11-01 DIAGNOSIS — Z79891 Long term (current) use of opiate analgesic: Secondary | ICD-10-CM | POA: Diagnosis not present

## 2015-11-01 DIAGNOSIS — E1140 Type 2 diabetes mellitus with diabetic neuropathy, unspecified: Secondary | ICD-10-CM | POA: Diagnosis not present

## 2015-11-01 DIAGNOSIS — L97519 Non-pressure chronic ulcer of other part of right foot with unspecified severity: Secondary | ICD-10-CM | POA: Diagnosis not present

## 2015-11-01 DIAGNOSIS — I129 Hypertensive chronic kidney disease with stage 1 through stage 4 chronic kidney disease, or unspecified chronic kidney disease: Secondary | ICD-10-CM | POA: Diagnosis not present

## 2015-11-01 DIAGNOSIS — I350 Nonrheumatic aortic (valve) stenosis: Secondary | ICD-10-CM | POA: Diagnosis not present

## 2015-11-01 DIAGNOSIS — E11621 Type 2 diabetes mellitus with foot ulcer: Secondary | ICD-10-CM | POA: Diagnosis not present

## 2015-11-02 ENCOUNTER — Ambulatory Visit (INDEPENDENT_AMBULATORY_CARE_PROVIDER_SITE_OTHER): Payer: Medicare Other | Admitting: Sports Medicine

## 2015-11-02 ENCOUNTER — Encounter: Payer: Self-pay | Admitting: Sports Medicine

## 2015-11-02 DIAGNOSIS — L97513 Non-pressure chronic ulcer of other part of right foot with necrosis of muscle: Secondary | ICD-10-CM

## 2015-11-02 DIAGNOSIS — Z9889 Other specified postprocedural states: Secondary | ICD-10-CM

## 2015-11-02 DIAGNOSIS — M79671 Pain in right foot: Secondary | ICD-10-CM

## 2015-11-02 DIAGNOSIS — L02611 Cutaneous abscess of right foot: Secondary | ICD-10-CM | POA: Diagnosis not present

## 2015-11-02 DIAGNOSIS — L97519 Non-pressure chronic ulcer of other part of right foot with unspecified severity: Secondary | ICD-10-CM | POA: Diagnosis not present

## 2015-11-02 DIAGNOSIS — E1142 Type 2 diabetes mellitus with diabetic polyneuropathy: Secondary | ICD-10-CM

## 2015-11-02 NOTE — Progress Notes (Signed)
Patient ID: Emily Miranda, female   DOB: September 07, 1940, 76 y.o.   MRN: [id number] Subjective: Emily Miranda is a 75 y.o. female patient seen in office for follow up evaluation of ulceration on right foot, POV #7 s/p incision and drainage with bone and tissue culture and hospital admission on 09/08/2015 patient has daily home nursing changing dressings and is on PICC line antibiotics since yesterday 10-25-15 on Ertapenem and Vancomycin. Reports that she has not had nausea but has had a small episode of diarrhea which she has not taken anything for. Denies any other issues at this time.   Reports fasting blood sugar has been good.   Patient Active Problem List   Diagnosis Date Noted  . Abdominal pain, RLQ (right lower quadrant) -chronic since 2009 06/02/2013  . Constipation, chronic 06/02/2013  . SUI (stress urinary incontinence, female) 06/02/2013  . Recurrent UTI (urinary tract infection) 06/02/2013  . HTN (hypertension)   . CHEST PAIN 11/27/2009  . OVERWEIGHT 11/28/2008  . ESSENTIAL HYPERTENSION, BENIGN 11/28/2008  . DM 11/25/2008  . AORTIC STENOSIS 11/25/2008  . PANCREATITIS, HX OF 11/25/2008   Current Outpatient Prescriptions on File Prior to Visit  Medication Sig Dispense Refill  . amLODipine (NORVASC) 5 MG tablet Take 5 mg by mouth daily.     Marland Kitchen ampicillin (PRINCIPEN) 500 MG capsule Take 1 capsule (500 mg total) by mouth 4 (four) times daily. 56 capsule 0  . Ascorbic Acid (VITAMIN C) 100 MG tablet Take 100 mg by mouth daily.      . cephALEXin (KEFLEX) 500 MG capsule Take 1 capsule (500 mg total) by mouth 2 (two) times daily. 28 capsule 0  . chlorthalidone (HYGROTON) 25 MG tablet Take 0.5 tablets (12.5 mg total) by mouth daily. 15 tablet 11  . diphenoxylate-atropine (LOMOTIL) 2.5-0.025 MG tablet Take 1 tablet by mouth 4 (four) times daily as needed for diarrhea or loose stools. 30 tablet 0  . ertapenem 1 g in sodium chloride 0.9 % 50 mL Inject 1 g into the vein daily. 13 Syringe 0   . furosemide (LASIX) 20 MG tablet Take 20 mg by mouth daily.     Marland Kitchen glimepiride (AMARYL) 4 MG tablet Take 4 mg by mouth daily with breakfast.    . HYDROcodone-acetaminophen (NORCO) 5-325 MG per tablet Take 1 tablet by mouth every 6 (six) hours as needed. 10 tablet 0  . linezolid (ZYVOX) 600 MG/300ML IVPB Inject 300 mLs (600 mg total) into the vein every 12 (twelve) hours. 100 mL 23  . lisinopril (PRINIVIL,ZESTRIL) 10 MG tablet Take 10 mg by mouth daily.    . methocarbamol (ROBAXIN) 500 MG tablet Take 1.5 tablets (750 mg total) by mouth every 8 (eight) hours as needed for muscle spasms (use for muscle cramps/pain). 30 tablet 2  . naproxen (NAPROSYN) 500 MG tablet Take 1 tablet (500 mg total) by mouth 2 (two) times daily with a meal. 40 tablet 1  . nitrofurantoin, macrocrystal-monohydrate, (MACROBID) 100 MG capsule Take 100 mg by mouth 2 (two) times daily.     . ONE TOUCH ULTRA TEST test strip 1 each by Other route once a week.     Marland Kitchen Phenyleph-Doxylamine-DM-APAP (ALKA SELTZER PLUS PO) Take 1 tablet by mouth daily as needed (congestion).    . pregabalin (LYRICA) 75 MG capsule Take 1 capsule (75 mg total) by mouth 2 (two) times daily. 60 capsule 1  . sulfamethoxazole-trimethoprim (BACTRIM DS,SEPTRA DS) 800-160 MG tablet Take 1 tablet by mouth 2 (two) times daily. 20  tablet 0  . tetracycline (ACHROMYCIN,SUMYCIN) 500 MG capsule TAKE 1 CAPSULE (500 MG TOTAL) BY MOUTH 2 (TWO) TIMES DAILY. 28 capsule 0  . tetracycline (ACHROMYCIN,SUMYCIN) 500 MG capsule Take 1 capsule (500 mg total) by mouth 2 (two) times daily. 28 capsule 0  . TRADJENTA 5 MG TABS tablet Take 5 mg by mouth daily.     . traMADol (ULTRAM) 50 MG tablet TAKE 1 TABLET BY MOUTH EVERY 8 HOURS AS NEEDED FOR FOOT PAIN 30 tablet 0   Current Facility-Administered Medications on File Prior to Visit  Medication Dose Route Frequency Provider Last Rate Last Dose  . triamcinolone acetonide (KENALOG) 10 MG/ML injection 10 mg  10 mg Other Once Asencion Islam, DPM       Allergies  Allergen Reactions  . Levofloxacin Nausea And Vomiting    Other reaction(s): Confusion    Recent Results (from the past 2160 hour(s))  Wound culture     Status: Abnormal   Collection Time: 09/07/15  4:03 PM  Result Value Ref Range   Gram Stain Result Final report    Result 1 Comment     Comment: No white blood cells seen.   RESULT 2 Comment     Comment: Few gram negative rods.   Aerobic Bacterial Culture Final report (A)    Result 1 Proteus mirabilis (A)     Comment: Heavy growth   Result 2 Staphylococcus aureus (A)     Comment: Moderate growth Based on resistance to penicillin and susceptibility to oxacillin this isolate would be susceptible to: * Penicillinase-stable penicillins; such as:     Cloxacillin     Dicloxacillin     Nafcillin * Beta-lactam/beta-lactamase inhibitor combinations; such as:     Amoxicillin-clavulanic acid     Ampicillin-sulbactam * Antistaphylococcal cephems; such as:     Cefaclor     Cefuroxime * Antistaphylococcal carbapenems; such as:     Imipenem     Meropenem    ANTIMICROBIAL SUSCEPTIBILITY Comment     Comment:       ** S = Susceptible; I = Intermediate; R = Resistant **                    P = Positive; N = Negative             MICS are expressed in micrograms per mL    Antibiotic                 RSLT#1    RSLT#2    RSLT#3    RSLT#4 Amoxicillin/Clavulanic Acid    S Ampicillin                     S Cefepime                       S Ceftriaxone                    S Cefuroxime                     S Ciprofloxacin                  S         S Clindamycin                              S Ertapenem  S Erythromycin                             S Gentamicin                     S         S Levofloxacin                   S         S Linezolid                                S Moxifloxacin                             S Oxacillin                                S Penicillin                                R Piperacillin                   S Quinupristin/Dalfopristin                S Rifampin                                 S Tetracycline                   R         S Tobr amycin                     S Trimethoprim/Sulfa             R         S Vancomycin                               S     Objective: There were no vitals filed for this visit.  General: Patient is awake, alert, oriented x 3 and in no acute distress.  No acute changes in physical exam  Dermatology: Skin is warm and dry bilateral with a full thickness ulceration down to the level of muscle that appears to be filling in measuring 8.5 cm in length by approximately 4 cm in width at the widest point extending from the plantar medial midfoot all the way to the hallux, status post incision and drainage right foot with moderate granular buds and coverage of underlying bone noted same as previous, there is rolled borders at the wound margins with 1+ pitting edema, no warmth, no erythema, no acute cellulitis, no malodor, no active drainage, however, there is mild serous drainage noted on the inner dressing layers. There is dry blood to be right third toenail with no acute signs of infection unchanged from prior.  Vascular: Dorsalis Pedis pulse = 2/4 Bilateral,  Posterior Tibial pulse = 1/4 Bilateral,  Capillary Fill Time < 5 seconds  Neurologic: Epicritic sensation diminished  to the level of ankle using the 5.07/10g [employer].  Musculosketal:  There is no pain with palpation to the ulceration site on right foot, pes planus foot type with significant talonavicular bulge, right greater than left foot with residual chronic none active Charcot. No pain with compression to calves bilateral.  Strength acceptable for patient status.   Assessment and Plan:  Problem List Items Addressed This Visit    None    Visit Diagnoses    Right foot ulcer, with necrosis of muscle (HCC)    -  Primary    S/P foot surgery, right         Diabetic polyneuropathy associated with type 2 diabetes mellitus (HCC)        Right foot pain          -Examined patient and discussed the progression of the wound and treatment alternatives. -Cleansed ulceration with antimicrobial spray and then treated hypergranular areas with silver nitrate and packed open with dry gauze and applied dry sterile dressing secured with Ace wrap. Daily Nursing orders continue with antimicrobral wound wash and aquacell AG hydrofiber with silver.  -Continue PICC line antibiotics Ertapenem and Vanco to finish 11-08-15. Awaiting ID consultation which was placed for any additional recommendations since intraoperative cultures revealed actinomycies, enterococcus faecalies, Proteus mirabilis, fungoides, staph aureus. Patient to see ID on 11-13-15 -Current wound culture (10-05-15) [facility] reveals no growth; however, will likely have to reculture area, Once antibiotics are completed to make sure there is no recurrence of infection -Discussed with patient will consider misonix debridement and delayed primary closure when wound cultures are negative and antibiotics are completed; will proceed with surgical paperwork at next visit in preparation for possible debrided with primary closure with possible drain -Continue with limited weightbearing for transfers only with post op shoe and with the assistance of a rolling walker. No excessive walking or standing.  -Patient picked up prescription for Lomitil to take as needed for Diarrhea. Advised patient if continues will have to get stool test done to check for C. Diff -Patient to return to office in 1 week or sooner if problems or issues arise.  Asencion Islam, DPM

## 2015-11-03 ENCOUNTER — Encounter: Payer: Self-pay | Admitting: Sports Medicine

## 2015-11-03 DIAGNOSIS — E1159 Type 2 diabetes mellitus with other circulatory complications: Secondary | ICD-10-CM | POA: Diagnosis not present

## 2015-11-03 DIAGNOSIS — I350 Nonrheumatic aortic (valve) stenosis: Secondary | ICD-10-CM | POA: Diagnosis not present

## 2015-11-03 DIAGNOSIS — E11621 Type 2 diabetes mellitus with foot ulcer: Secondary | ICD-10-CM | POA: Diagnosis not present

## 2015-11-03 DIAGNOSIS — N181 Chronic kidney disease, stage 1: Secondary | ICD-10-CM | POA: Diagnosis not present

## 2015-11-03 DIAGNOSIS — D649 Anemia, unspecified: Secondary | ICD-10-CM | POA: Diagnosis not present

## 2015-11-03 DIAGNOSIS — E1140 Type 2 diabetes mellitus with diabetic neuropathy, unspecified: Secondary | ICD-10-CM | POA: Diagnosis not present

## 2015-11-03 DIAGNOSIS — L97519 Non-pressure chronic ulcer of other part of right foot with unspecified severity: Secondary | ICD-10-CM | POA: Diagnosis not present

## 2015-11-03 DIAGNOSIS — Z79891 Long term (current) use of opiate analgesic: Secondary | ICD-10-CM | POA: Diagnosis not present

## 2015-11-03 DIAGNOSIS — Z792 Long term (current) use of antibiotics: Secondary | ICD-10-CM | POA: Diagnosis not present

## 2015-11-03 DIAGNOSIS — Z7984 Long term (current) use of oral hypoglycemic drugs: Secondary | ICD-10-CM | POA: Diagnosis not present

## 2015-11-03 DIAGNOSIS — L03115 Cellulitis of right lower limb: Secondary | ICD-10-CM | POA: Diagnosis not present

## 2015-11-03 DIAGNOSIS — I129 Hypertensive chronic kidney disease with stage 1 through stage 4 chronic kidney disease, or unspecified chronic kidney disease: Secondary | ICD-10-CM | POA: Diagnosis not present

## 2015-11-03 DIAGNOSIS — M1990 Unspecified osteoarthritis, unspecified site: Secondary | ICD-10-CM | POA: Diagnosis not present

## 2015-11-03 NOTE — Telephone Encounter (Signed)
-----   Message from Asencion Islam, [place] sent at 11/02/2015  3:16 PM EDT ----- Regarding: Pull Picc line Hi Valery Can we contact home nursing to see what the protocol is for removing the picc line? Patient will be finishing up her antibiotics soon. Once patient is done with antibiotics which will be before her next visit with me I would recommend that the PICC line is pulled or removed afterwards next thurs so that way I can take a look at her wound and we can make sure she doesn't need any more doses of antibiotics before removal. Can you ask nursing if this is acceptable? Thanks Dr. Marylene Land

## 2015-11-04 DIAGNOSIS — Z792 Long term (current) use of antibiotics: Secondary | ICD-10-CM | POA: Diagnosis not present

## 2015-11-04 DIAGNOSIS — I350 Nonrheumatic aortic (valve) stenosis: Secondary | ICD-10-CM | POA: Diagnosis not present

## 2015-11-04 DIAGNOSIS — I129 Hypertensive chronic kidney disease with stage 1 through stage 4 chronic kidney disease, or unspecified chronic kidney disease: Secondary | ICD-10-CM | POA: Diagnosis not present

## 2015-11-04 DIAGNOSIS — E1140 Type 2 diabetes mellitus with diabetic neuropathy, unspecified: Secondary | ICD-10-CM | POA: Diagnosis not present

## 2015-11-04 DIAGNOSIS — M1990 Unspecified osteoarthritis, unspecified site: Secondary | ICD-10-CM | POA: Diagnosis not present

## 2015-11-04 DIAGNOSIS — N181 Chronic kidney disease, stage 1: Secondary | ICD-10-CM | POA: Diagnosis not present

## 2015-11-04 DIAGNOSIS — D649 Anemia, unspecified: Secondary | ICD-10-CM | POA: Diagnosis not present

## 2015-11-04 DIAGNOSIS — E11621 Type 2 diabetes mellitus with foot ulcer: Secondary | ICD-10-CM | POA: Diagnosis not present

## 2015-11-04 DIAGNOSIS — Z7984 Long term (current) use of oral hypoglycemic drugs: Secondary | ICD-10-CM | POA: Diagnosis not present

## 2015-11-04 DIAGNOSIS — E1159 Type 2 diabetes mellitus with other circulatory complications: Secondary | ICD-10-CM | POA: Diagnosis not present

## 2015-11-04 DIAGNOSIS — L03115 Cellulitis of right lower limb: Secondary | ICD-10-CM | POA: Diagnosis not present

## 2015-11-04 DIAGNOSIS — L97519 Non-pressure chronic ulcer of other part of right foot with unspecified severity: Secondary | ICD-10-CM | POA: Diagnosis not present

## 2015-11-04 DIAGNOSIS — Z79891 Long term (current) use of opiate analgesic: Secondary | ICD-10-CM | POA: Diagnosis not present

## 2015-11-05 DIAGNOSIS — Z79891 Long term (current) use of opiate analgesic: Secondary | ICD-10-CM | POA: Diagnosis not present

## 2015-11-05 DIAGNOSIS — L97519 Non-pressure chronic ulcer of other part of right foot with unspecified severity: Secondary | ICD-10-CM | POA: Diagnosis not present

## 2015-11-05 DIAGNOSIS — Z792 Long term (current) use of antibiotics: Secondary | ICD-10-CM | POA: Diagnosis not present

## 2015-11-05 DIAGNOSIS — N181 Chronic kidney disease, stage 1: Secondary | ICD-10-CM | POA: Diagnosis not present

## 2015-11-05 DIAGNOSIS — E11621 Type 2 diabetes mellitus with foot ulcer: Secondary | ICD-10-CM | POA: Diagnosis not present

## 2015-11-05 DIAGNOSIS — D649 Anemia, unspecified: Secondary | ICD-10-CM | POA: Diagnosis not present

## 2015-11-05 DIAGNOSIS — L03115 Cellulitis of right lower limb: Secondary | ICD-10-CM | POA: Diagnosis not present

## 2015-11-05 DIAGNOSIS — Z7984 Long term (current) use of oral hypoglycemic drugs: Secondary | ICD-10-CM | POA: Diagnosis not present

## 2015-11-05 DIAGNOSIS — M1990 Unspecified osteoarthritis, unspecified site: Secondary | ICD-10-CM | POA: Diagnosis not present

## 2015-11-05 DIAGNOSIS — I350 Nonrheumatic aortic (valve) stenosis: Secondary | ICD-10-CM | POA: Diagnosis not present

## 2015-11-05 DIAGNOSIS — E1159 Type 2 diabetes mellitus with other circulatory complications: Secondary | ICD-10-CM | POA: Diagnosis not present

## 2015-11-05 DIAGNOSIS — E1140 Type 2 diabetes mellitus with diabetic neuropathy, unspecified: Secondary | ICD-10-CM | POA: Diagnosis not present

## 2015-11-05 DIAGNOSIS — I129 Hypertensive chronic kidney disease with stage 1 through stage 4 chronic kidney disease, or unspecified chronic kidney disease: Secondary | ICD-10-CM | POA: Diagnosis not present

## 2015-11-06 ENCOUNTER — Encounter: Payer: Self-pay | Admitting: Sports Medicine

## 2015-11-06 DIAGNOSIS — D649 Anemia, unspecified: Secondary | ICD-10-CM | POA: Diagnosis not present

## 2015-11-06 DIAGNOSIS — L03115 Cellulitis of right lower limb: Secondary | ICD-10-CM | POA: Diagnosis not present

## 2015-11-06 DIAGNOSIS — E1140 Type 2 diabetes mellitus with diabetic neuropathy, unspecified: Secondary | ICD-10-CM | POA: Diagnosis not present

## 2015-11-06 DIAGNOSIS — Z7984 Long term (current) use of oral hypoglycemic drugs: Secondary | ICD-10-CM | POA: Diagnosis not present

## 2015-11-06 DIAGNOSIS — N181 Chronic kidney disease, stage 1: Secondary | ICD-10-CM | POA: Diagnosis not present

## 2015-11-06 DIAGNOSIS — Z792 Long term (current) use of antibiotics: Secondary | ICD-10-CM | POA: Diagnosis not present

## 2015-11-06 DIAGNOSIS — I129 Hypertensive chronic kidney disease with stage 1 through stage 4 chronic kidney disease, or unspecified chronic kidney disease: Secondary | ICD-10-CM | POA: Diagnosis not present

## 2015-11-06 DIAGNOSIS — M1990 Unspecified osteoarthritis, unspecified site: Secondary | ICD-10-CM | POA: Diagnosis not present

## 2015-11-06 DIAGNOSIS — E11621 Type 2 diabetes mellitus with foot ulcer: Secondary | ICD-10-CM | POA: Diagnosis not present

## 2015-11-06 DIAGNOSIS — I350 Nonrheumatic aortic (valve) stenosis: Secondary | ICD-10-CM | POA: Diagnosis not present

## 2015-11-06 DIAGNOSIS — L97519 Non-pressure chronic ulcer of other part of right foot with unspecified severity: Secondary | ICD-10-CM | POA: Diagnosis not present

## 2015-11-06 DIAGNOSIS — Z79891 Long term (current) use of opiate analgesic: Secondary | ICD-10-CM | POA: Diagnosis not present

## 2015-11-06 DIAGNOSIS — E1159 Type 2 diabetes mellitus with other circulatory complications: Secondary | ICD-10-CM | POA: Diagnosis not present

## 2015-11-07 DIAGNOSIS — M1990 Unspecified osteoarthritis, unspecified site: Secondary | ICD-10-CM | POA: Diagnosis not present

## 2015-11-07 DIAGNOSIS — L03115 Cellulitis of right lower limb: Secondary | ICD-10-CM | POA: Diagnosis not present

## 2015-11-07 DIAGNOSIS — Z792 Long term (current) use of antibiotics: Secondary | ICD-10-CM | POA: Diagnosis not present

## 2015-11-07 DIAGNOSIS — E11621 Type 2 diabetes mellitus with foot ulcer: Secondary | ICD-10-CM | POA: Diagnosis not present

## 2015-11-07 DIAGNOSIS — E1140 Type 2 diabetes mellitus with diabetic neuropathy, unspecified: Secondary | ICD-10-CM | POA: Diagnosis not present

## 2015-11-07 DIAGNOSIS — I350 Nonrheumatic aortic (valve) stenosis: Secondary | ICD-10-CM | POA: Diagnosis not present

## 2015-11-07 DIAGNOSIS — Z79891 Long term (current) use of opiate analgesic: Secondary | ICD-10-CM | POA: Diagnosis not present

## 2015-11-07 DIAGNOSIS — N181 Chronic kidney disease, stage 1: Secondary | ICD-10-CM | POA: Diagnosis not present

## 2015-11-07 DIAGNOSIS — L97519 Non-pressure chronic ulcer of other part of right foot with unspecified severity: Secondary | ICD-10-CM | POA: Diagnosis not present

## 2015-11-07 DIAGNOSIS — I129 Hypertensive chronic kidney disease with stage 1 through stage 4 chronic kidney disease, or unspecified chronic kidney disease: Secondary | ICD-10-CM | POA: Diagnosis not present

## 2015-11-07 DIAGNOSIS — D649 Anemia, unspecified: Secondary | ICD-10-CM | POA: Diagnosis not present

## 2015-11-07 DIAGNOSIS — Z7984 Long term (current) use of oral hypoglycemic drugs: Secondary | ICD-10-CM | POA: Diagnosis not present

## 2015-11-07 DIAGNOSIS — E1159 Type 2 diabetes mellitus with other circulatory complications: Secondary | ICD-10-CM | POA: Diagnosis not present

## 2015-11-08 DIAGNOSIS — I129 Hypertensive chronic kidney disease with stage 1 through stage 4 chronic kidney disease, or unspecified chronic kidney disease: Secondary | ICD-10-CM | POA: Diagnosis not present

## 2015-11-08 DIAGNOSIS — L03115 Cellulitis of right lower limb: Secondary | ICD-10-CM | POA: Diagnosis not present

## 2015-11-08 DIAGNOSIS — E1140 Type 2 diabetes mellitus with diabetic neuropathy, unspecified: Secondary | ICD-10-CM | POA: Diagnosis not present

## 2015-11-08 DIAGNOSIS — Z792 Long term (current) use of antibiotics: Secondary | ICD-10-CM | POA: Diagnosis not present

## 2015-11-08 DIAGNOSIS — Z7984 Long term (current) use of oral hypoglycemic drugs: Secondary | ICD-10-CM | POA: Diagnosis not present

## 2015-11-08 DIAGNOSIS — Z79891 Long term (current) use of opiate analgesic: Secondary | ICD-10-CM | POA: Diagnosis not present

## 2015-11-08 DIAGNOSIS — M1990 Unspecified osteoarthritis, unspecified site: Secondary | ICD-10-CM | POA: Diagnosis not present

## 2015-11-08 DIAGNOSIS — D649 Anemia, unspecified: Secondary | ICD-10-CM | POA: Diagnosis not present

## 2015-11-08 DIAGNOSIS — N181 Chronic kidney disease, stage 1: Secondary | ICD-10-CM | POA: Diagnosis not present

## 2015-11-08 DIAGNOSIS — I350 Nonrheumatic aortic (valve) stenosis: Secondary | ICD-10-CM | POA: Diagnosis not present

## 2015-11-08 DIAGNOSIS — E11621 Type 2 diabetes mellitus with foot ulcer: Secondary | ICD-10-CM | POA: Diagnosis not present

## 2015-11-08 DIAGNOSIS — E1159 Type 2 diabetes mellitus with other circulatory complications: Secondary | ICD-10-CM | POA: Diagnosis not present

## 2015-11-08 DIAGNOSIS — L97519 Non-pressure chronic ulcer of other part of right foot with unspecified severity: Secondary | ICD-10-CM | POA: Diagnosis not present

## 2015-11-09 ENCOUNTER — Ambulatory Visit (INDEPENDENT_AMBULATORY_CARE_PROVIDER_SITE_OTHER): Payer: Medicare Other | Admitting: Sports Medicine

## 2015-11-09 ENCOUNTER — Encounter: Payer: Self-pay | Admitting: Sports Medicine

## 2015-11-09 DIAGNOSIS — L97513 Non-pressure chronic ulcer of other part of right foot with necrosis of muscle: Secondary | ICD-10-CM | POA: Diagnosis not present

## 2015-11-09 DIAGNOSIS — M79671 Pain in right foot: Secondary | ICD-10-CM

## 2015-11-09 DIAGNOSIS — Z9889 Other specified postprocedural states: Secondary | ICD-10-CM

## 2015-11-09 DIAGNOSIS — E1142 Type 2 diabetes mellitus with diabetic polyneuropathy: Secondary | ICD-10-CM | POA: Diagnosis not present

## 2015-11-09 NOTE — Progress Notes (Signed)
Patient ID: Emily Miranda, female   DOB: 05-Dec-1940, 76 y.o.   MRN: [id number] Subjective: Emily Miranda is a 75 y.o. female patient seen in office for follow up evaluation of ulceration on right foot, POV #8 s/p incision and drainage with bone and tissue culture and hospital admission on 09/08/2015 patient has daily home nursing changing dressings and has finished PICC line antibiotics since yesterday 11-08-15, was on Ertapenem and Vancomycin. Denies any issues at this time.   Reports fasting blood sugar has been good.   Patient Active Problem List   Diagnosis Date Noted  . Abdominal pain, RLQ (right lower quadrant) -chronic since 2009 06/02/2013  . Constipation, chronic 06/02/2013  . SUI (stress urinary incontinence, female) 06/02/2013  . Recurrent UTI (urinary tract infection) 06/02/2013  . HTN (hypertension)   . CHEST PAIN 11/27/2009  . OVERWEIGHT 11/28/2008  . ESSENTIAL HYPERTENSION, BENIGN 11/28/2008  . DM 11/25/2008  . AORTIC STENOSIS 11/25/2008  . PANCREATITIS, HX OF 11/25/2008   Current Outpatient Prescriptions on File Prior to Visit  Medication Sig Dispense Refill  . amLODipine (NORVASC) 5 MG tablet Take 5 mg by mouth daily.     Marland Kitchen ampicillin (PRINCIPEN) 500 MG capsule Take 1 capsule (500 mg total) by mouth 4 (four) times daily. 56 capsule 0  . Ascorbic Acid (VITAMIN C) 100 MG tablet Take 100 mg by mouth daily.      . cephALEXin (KEFLEX) 500 MG capsule Take 1 capsule (500 mg total) by mouth 2 (two) times daily. 28 capsule 0  . chlorthalidone (HYGROTON) 25 MG tablet Take 0.5 tablets (12.5 mg total) by mouth daily. 15 tablet 11  . diphenoxylate-atropine (LOMOTIL) 2.5-0.025 MG tablet Take 1 tablet by mouth 4 (four) times daily as needed for diarrhea or loose stools. 30 tablet 0  . ertapenem 1 g in sodium chloride 0.9 % 50 mL Inject 1 g into the vein daily. 13 Syringe 0  . furosemide (LASIX) 20 MG tablet Take 20 mg by mouth daily.     Marland Kitchen glimepiride (AMARYL) 4 MG tablet Take 4  mg by mouth daily with breakfast.    . HYDROcodone-acetaminophen (NORCO) 5-325 MG per tablet Take 1 tablet by mouth every 6 (six) hours as needed. 10 tablet 0  . linezolid (ZYVOX) 600 MG/300ML IVPB Inject 300 mLs (600 mg total) into the vein every 12 (twelve) hours. 100 mL 23  . lisinopril (PRINIVIL,ZESTRIL) 10 MG tablet Take 10 mg by mouth daily.    . methocarbamol (ROBAXIN) 500 MG tablet Take 1.5 tablets (750 mg total) by mouth every 8 (eight) hours as needed for muscle spasms (use for muscle cramps/pain). 30 tablet 2  . naproxen (NAPROSYN) 500 MG tablet Take 1 tablet (500 mg total) by mouth 2 (two) times daily with a meal. 40 tablet 1  . nitrofurantoin, macrocrystal-monohydrate, (MACROBID) 100 MG capsule Take 100 mg by mouth 2 (two) times daily.     . ONE TOUCH ULTRA TEST test strip 1 each by Other route once a week.     Marland Kitchen Phenyleph-Doxylamine-DM-APAP (ALKA SELTZER PLUS PO) Take 1 tablet by mouth daily as needed (congestion).    . pregabalin (LYRICA) 75 MG capsule Take 1 capsule (75 mg total) by mouth 2 (two) times daily. 60 capsule 1  . sulfamethoxazole-trimethoprim (BACTRIM DS,SEPTRA DS) 800-160 MG tablet Take 1 tablet by mouth 2 (two) times daily. 20 tablet 0  . tetracycline (ACHROMYCIN,SUMYCIN) 500 MG capsule TAKE 1 CAPSULE (500 MG TOTAL) BY MOUTH 2 (TWO) TIMES DAILY. 28  capsule 0  . tetracycline (ACHROMYCIN,SUMYCIN) 500 MG capsule Take 1 capsule (500 mg total) by mouth 2 (two) times daily. 28 capsule 0  . TRADJENTA 5 MG TABS tablet Take 5 mg by mouth daily.     . traMADol (ULTRAM) 50 MG tablet TAKE 1 TABLET BY MOUTH EVERY 8 HOURS AS NEEDED FOR FOOT PAIN 30 tablet 0   Current Facility-Administered Medications on File Prior to Visit  Medication Dose Route Frequency Provider Last Rate Last Dose  . triamcinolone acetonide (KENALOG) 10 MG/ML injection 10 mg  10 mg Other Once Asencion Islam, DPM       Allergies  Allergen Reactions  . Levofloxacin Nausea And Vomiting    Other reaction(s):  Confusion    Recent Results (from the past 2160 hour(s))  Wound culture     Status: Abnormal   Collection Time: 09/07/15  4:03 PM  Result Value Ref Range   Gram Stain Result Final report    Result 1 Comment     Comment: No white blood cells seen.   RESULT 2 Comment     Comment: Few gram negative rods.   Aerobic Bacterial Culture Final report (A)    Result 1 Proteus mirabilis (A)     Comment: Heavy growth   Result 2 Staphylococcus aureus (A)     Comment: Moderate growth Based on resistance to penicillin and susceptibility to oxacillin this isolate would be susceptible to: * Penicillinase-stable penicillins; such as:     Cloxacillin     Dicloxacillin     Nafcillin * Beta-lactam/beta-lactamase inhibitor combinations; such as:     Amoxicillin-clavulanic acid     Ampicillin-sulbactam * Antistaphylococcal cephems; such as:     Cefaclor     Cefuroxime * Antistaphylococcal carbapenems; such as:     Imipenem     Meropenem    ANTIMICROBIAL SUSCEPTIBILITY Comment     Comment:       ** S = Susceptible; I = Intermediate; R = Resistant **                    P = Positive; N = Negative             MICS are expressed in micrograms per mL    Antibiotic                 RSLT#1    RSLT#2    RSLT#3    RSLT#4 Amoxicillin/Clavulanic Acid    S Ampicillin                     S Cefepime                       S Ceftriaxone                    S Cefuroxime                     S Ciprofloxacin                  S         S Clindamycin                              S Ertapenem                      S Erythromycin  S Gentamicin                     S         S Levofloxacin                   S         S Linezolid                                S Moxifloxacin                             S Oxacillin                                S Penicillin                               R Piperacillin                   S Quinupristin/Dalfopristin                S Rifampin                                  S Tetracycline                   R         S Tobr amycin                     S Trimethoprim/Sulfa             R         S Vancomycin                               S     Objective: There were no vitals filed for this visit.  General: Patient is awake, alert, oriented x 3 and in no acute distress.  No acute changes in physical exam  Dermatology: Skin is warm and dry bilateral with a full thickness ulceration down to the level of muscle that appears to be filling in measuring 8 cm in length by approximately 3 cm in width at the widest point extending from the plantar medial midfoot all the way to the hallux (smaller than previous, status post incision and drainage right foot with moderate granular buds and coverage of underlying bone noted same as previous, there is rolled borders at the wound margins with 1+ pitting edema, no warmth, no erythema, no acute cellulitis, no malodor, no active drainage, however, there is mild serous drainage noted on the inner dressing layers. There is improved dry blood to be right third toenail with no acute signs of infection.  Vascular: Dorsalis Pedis pulse = 2/4 Bilateral,  Posterior Tibial pulse = 1/4 Bilateral,  Capillary Fill Time < 5 seconds  Neurologic: Epicritic sensation diminished  to the level of ankle using the 5.07/10g [employer].  Musculosketal:  There is no pain with palpation to the ulceration site on right foot, pes planus foot type with significant talonavicular bulge, right greater than left foot with residual chronic  none active Charcot. No pain with compression to calves bilateral.  Strength acceptable for patient status.   Assessment and Plan:  Problem List Items Addressed This Visit    None    Visit Diagnoses    Right foot ulcer, with necrosis of muscle (HCC)    -  Primary    S/P foot surgery, right        Diabetic polyneuropathy associated with type 2 diabetes mellitus (HCC)        Right foot pain           -Examined patient and discussed the progression of the wound and treatment alternatives. -Cleansed ulceration with antimicrobial spray and then treated hypergranular areas with silver nitrate and packed open with dry gauze and applied dry sterile dressing secured with Ace wrap. Daily Nursing orders continue with antimicrobral wound wash and aquacell AG hydrofiber with silver.  -Finished PICC line antibiotics Ertapenem and Vanco on 11-08-15. Awaiting ID consultation which was placed for any additional recommendations since intraoperative cultures revealed actinomycies, enterococcus faecalies, Proteus mirabilis, fungoides, staph aureus. Patient to see ID on 11-13-15 and then will plan to remove PICC line afterwards -Current wound culture (10-05-15) [facility] reveals no growth; however, will likely have to reculture area, Once antibiotics are completed to make sure there is no recurrence of infection -Discussed with patient will consider misonix debridement and delayed primary closure when wound cultures are negative and antibiotics are completed; will proceed with surgical paperwork at next visit in preparation for possible debrided with primary closure with possible drain -Continue with limited weightbearing for transfers only with post op shoe and with the assistance of a rolling walker. No excessive walking or standing.  -Patient to return to office in 1 week or sooner if problems or issues arise. Will culture at next visit and complete surgery paper work at this time.   Asencion Islam, DPM

## 2015-11-10 DIAGNOSIS — Z792 Long term (current) use of antibiotics: Secondary | ICD-10-CM | POA: Diagnosis not present

## 2015-11-10 DIAGNOSIS — I350 Nonrheumatic aortic (valve) stenosis: Secondary | ICD-10-CM | POA: Diagnosis not present

## 2015-11-10 DIAGNOSIS — E1159 Type 2 diabetes mellitus with other circulatory complications: Secondary | ICD-10-CM | POA: Diagnosis not present

## 2015-11-10 DIAGNOSIS — L97519 Non-pressure chronic ulcer of other part of right foot with unspecified severity: Secondary | ICD-10-CM | POA: Diagnosis not present

## 2015-11-10 DIAGNOSIS — I129 Hypertensive chronic kidney disease with stage 1 through stage 4 chronic kidney disease, or unspecified chronic kidney disease: Secondary | ICD-10-CM | POA: Diagnosis not present

## 2015-11-10 DIAGNOSIS — D649 Anemia, unspecified: Secondary | ICD-10-CM | POA: Diagnosis not present

## 2015-11-10 DIAGNOSIS — L03115 Cellulitis of right lower limb: Secondary | ICD-10-CM | POA: Diagnosis not present

## 2015-11-10 DIAGNOSIS — E1140 Type 2 diabetes mellitus with diabetic neuropathy, unspecified: Secondary | ICD-10-CM | POA: Diagnosis not present

## 2015-11-10 DIAGNOSIS — Z79891 Long term (current) use of opiate analgesic: Secondary | ICD-10-CM | POA: Diagnosis not present

## 2015-11-10 DIAGNOSIS — Z7984 Long term (current) use of oral hypoglycemic drugs: Secondary | ICD-10-CM | POA: Diagnosis not present

## 2015-11-10 DIAGNOSIS — E11621 Type 2 diabetes mellitus with foot ulcer: Secondary | ICD-10-CM | POA: Diagnosis not present

## 2015-11-10 DIAGNOSIS — M1990 Unspecified osteoarthritis, unspecified site: Secondary | ICD-10-CM | POA: Diagnosis not present

## 2015-11-10 DIAGNOSIS — N181 Chronic kidney disease, stage 1: Secondary | ICD-10-CM | POA: Diagnosis not present

## 2015-11-11 DIAGNOSIS — Z792 Long term (current) use of antibiotics: Secondary | ICD-10-CM | POA: Diagnosis not present

## 2015-11-11 DIAGNOSIS — M1990 Unspecified osteoarthritis, unspecified site: Secondary | ICD-10-CM | POA: Diagnosis not present

## 2015-11-11 DIAGNOSIS — E11621 Type 2 diabetes mellitus with foot ulcer: Secondary | ICD-10-CM | POA: Diagnosis not present

## 2015-11-11 DIAGNOSIS — L03115 Cellulitis of right lower limb: Secondary | ICD-10-CM | POA: Diagnosis not present

## 2015-11-11 DIAGNOSIS — Z79891 Long term (current) use of opiate analgesic: Secondary | ICD-10-CM | POA: Diagnosis not present

## 2015-11-11 DIAGNOSIS — E1140 Type 2 diabetes mellitus with diabetic neuropathy, unspecified: Secondary | ICD-10-CM | POA: Diagnosis not present

## 2015-11-11 DIAGNOSIS — I129 Hypertensive chronic kidney disease with stage 1 through stage 4 chronic kidney disease, or unspecified chronic kidney disease: Secondary | ICD-10-CM | POA: Diagnosis not present

## 2015-11-11 DIAGNOSIS — E1159 Type 2 diabetes mellitus with other circulatory complications: Secondary | ICD-10-CM | POA: Diagnosis not present

## 2015-11-11 DIAGNOSIS — D649 Anemia, unspecified: Secondary | ICD-10-CM | POA: Diagnosis not present

## 2015-11-11 DIAGNOSIS — N181 Chronic kidney disease, stage 1: Secondary | ICD-10-CM | POA: Diagnosis not present

## 2015-11-11 DIAGNOSIS — I350 Nonrheumatic aortic (valve) stenosis: Secondary | ICD-10-CM | POA: Diagnosis not present

## 2015-11-11 DIAGNOSIS — Z7984 Long term (current) use of oral hypoglycemic drugs: Secondary | ICD-10-CM | POA: Diagnosis not present

## 2015-11-11 DIAGNOSIS — L97519 Non-pressure chronic ulcer of other part of right foot with unspecified severity: Secondary | ICD-10-CM | POA: Diagnosis not present

## 2015-11-12 DIAGNOSIS — L03115 Cellulitis of right lower limb: Secondary | ICD-10-CM | POA: Diagnosis not present

## 2015-11-12 DIAGNOSIS — D649 Anemia, unspecified: Secondary | ICD-10-CM | POA: Diagnosis not present

## 2015-11-12 DIAGNOSIS — Z792 Long term (current) use of antibiotics: Secondary | ICD-10-CM | POA: Diagnosis not present

## 2015-11-12 DIAGNOSIS — Z79891 Long term (current) use of opiate analgesic: Secondary | ICD-10-CM | POA: Diagnosis not present

## 2015-11-12 DIAGNOSIS — E11621 Type 2 diabetes mellitus with foot ulcer: Secondary | ICD-10-CM | POA: Diagnosis not present

## 2015-11-12 DIAGNOSIS — Z7984 Long term (current) use of oral hypoglycemic drugs: Secondary | ICD-10-CM | POA: Diagnosis not present

## 2015-11-12 DIAGNOSIS — M1990 Unspecified osteoarthritis, unspecified site: Secondary | ICD-10-CM | POA: Diagnosis not present

## 2015-11-12 DIAGNOSIS — I350 Nonrheumatic aortic (valve) stenosis: Secondary | ICD-10-CM | POA: Diagnosis not present

## 2015-11-12 DIAGNOSIS — E1159 Type 2 diabetes mellitus with other circulatory complications: Secondary | ICD-10-CM | POA: Diagnosis not present

## 2015-11-12 DIAGNOSIS — E1140 Type 2 diabetes mellitus with diabetic neuropathy, unspecified: Secondary | ICD-10-CM | POA: Diagnosis not present

## 2015-11-12 DIAGNOSIS — I129 Hypertensive chronic kidney disease with stage 1 through stage 4 chronic kidney disease, or unspecified chronic kidney disease: Secondary | ICD-10-CM | POA: Diagnosis not present

## 2015-11-12 DIAGNOSIS — N181 Chronic kidney disease, stage 1: Secondary | ICD-10-CM | POA: Diagnosis not present

## 2015-11-12 DIAGNOSIS — L97519 Non-pressure chronic ulcer of other part of right foot with unspecified severity: Secondary | ICD-10-CM | POA: Diagnosis not present

## 2015-11-13 ENCOUNTER — Ambulatory Visit (INDEPENDENT_AMBULATORY_CARE_PROVIDER_SITE_OTHER): Payer: Medicare Other | Admitting: Internal Medicine

## 2015-11-13 ENCOUNTER — Encounter: Payer: Self-pay | Admitting: Sports Medicine

## 2015-11-13 ENCOUNTER — Encounter: Payer: Self-pay | Admitting: Internal Medicine

## 2015-11-13 ENCOUNTER — Telehealth: Payer: Self-pay

## 2015-11-13 VITALS — BP 182/68 | HR 57 | Temp 98.5°F | Ht 66.0 in | Wt 145.0 lb

## 2015-11-13 DIAGNOSIS — T148XXA Other injury of unspecified body region, initial encounter: Principal | ICD-10-CM

## 2015-11-13 DIAGNOSIS — L089 Local infection of the skin and subcutaneous tissue, unspecified: Secondary | ICD-10-CM

## 2015-11-13 DIAGNOSIS — T148 Other injury of unspecified body region: Secondary | ICD-10-CM

## 2015-11-13 NOTE — Telephone Encounter (Signed)
Called [facility] at [phone] and ordered Louine Schalk pic line to be pulled per Dr. Luciana Axe.

## 2015-11-13 NOTE — Progress Notes (Signed)
[facility]      Reason for Consult: osteomyelitis    Referring Physician: Dr. Marylene Land    Patient ID: Emily Miranda, female    DOB: 11/04/1940, 75 y.o.   MRN: [id number]  HPI:   Here for new patient evaluation of osteomyelitis.  Followed by Dr. Marylene Land of podiatry and developed ulceration of right foot.  Had I and D with bone and tissue culture during hospitalization April 2017 at [place].  Culture of area intraoperatively grew multiple organisms including actinomycies, E faecalis, Proteus, Fungoides, Staph aureus. No positive bone culture noted.  Initially was placed on tetracycline and ampicillin and then IV vancomycin and ertapenem through 11/08/15.  Previous record reviewed from podiatry.  Surgical debridement in April.  No fever, no chills.   Past Medical History  Diagnosis Date  . Diabetes mellitus   . Pancreatitis   . Aortic stenosis, mild   . HTN (hypertension)   . Neuropathy (HCC)   . Charcot foot due to diabetes mellitus (HCC)   . Hyperlipidemia     Prior to Admission medications   Medication Sig Start Date End Date Taking? Authorizing Provider  amLODipine (NORVASC) 5 MG tablet Take 5 mg by mouth daily.  05/20/13   Historical Provider, MD  ampicillin (PRINCIPEN) 500 MG capsule Take 1 capsule (500 mg total) by mouth 4 (four) times daily. 09/21/15   Asencion Islam, DPM  Ascorbic Acid (VITAMIN C) 100 MG tablet Take 100 mg by mouth daily.      Historical Provider, MD  cephALEXin (KEFLEX) 500 MG capsule Take 1 capsule (500 mg total) by mouth 2 (two) times daily. 04/26/15   Asencion Islam, DPM  chlorthalidone (HYGROTON) 25 MG tablet Take 0.5 tablets (12.5 mg total) by mouth daily. 06/05/15   Rollene Rotunda, MD  diphenoxylate-atropine (LOMOTIL) 2.5-0.025 MG tablet Take 1 tablet by mouth 4 (four) times daily as needed for diarrhea or loose stools. 10/26/15   Titorya Stover, DPM  ertapenem 1 g in sodium chloride 0.9 % 50 mL Inject 1 g into the vein daily.  10/23/15   Asencion Islam, DPM  furosemide (LASIX) 20 MG tablet Take 20 mg by mouth daily.  05/14/13   Historical Provider, MD  glimepiride (AMARYL) 4 MG tablet Take 4 mg by mouth daily with breakfast.    Historical Provider, MD  HYDROcodone-acetaminophen (NORCO) 5-325 MG per tablet Take 1 tablet by mouth every 6 (six) hours as needed. 08/08/14   Toy Cookey, MD  linezolid (ZYVOX) 600 MG/300ML IVPB Inject 300 mLs (600 mg total) into the vein every 12 (twelve) hours. 10/23/15   Titorya Stover, DPM  lisinopril (PRINIVIL,ZESTRIL) 10 MG tablet Take 10 mg by mouth daily.    Historical Provider, MD  methocarbamol (ROBAXIN) 500 MG tablet Take 1.5 tablets (750 mg total) by mouth every 8 (eight) hours as needed for muscle spasms (use for muscle cramps/pain). 06/02/13   Karie Soda, MD  naproxen (NAPROSYN) 500 MG tablet Take 1 tablet (500 mg total) by mouth 2 (two) times daily with a meal. 06/02/13   Karie Soda, MD  nitrofurantoin, macrocrystal-monohydrate, (MACROBID) 100 MG capsule Take 100 mg by mouth 2 (two) times daily.     Historical Provider, MD  ONE TOUCH ULTRA TEST test strip 1 each by Other route once a week.  05/26/13   Historical Provider, MD  Phenyleph-Doxylamine-DM-APAP (ALKA SELTZER PLUS PO) Take 1 tablet by mouth daily as needed (congestion).    Historical Provider, MD  pregabalin (LYRICA) 75  MG capsule Take 1 capsule (75 mg total) by mouth 2 (two) times daily. 06/09/13   Karie Soda, MD  sulfamethoxazole-trimethoprim (BACTRIM DS,SEPTRA DS) 800-160 MG tablet Take 1 tablet by mouth 2 (two) times daily. 06/29/15   Titorya Stover, DPM  tetracycline (ACHROMYCIN,SUMYCIN) 500 MG capsule TAKE 1 CAPSULE (500 MG TOTAL) BY MOUTH 2 (TWO) TIMES DAILY. 10/10/15   Asencion Islam, DPM  tetracycline (ACHROMYCIN,SUMYCIN) 500 MG capsule Take 1 capsule (500 mg total) by mouth 2 (two) times daily. 10/08/15   Vivi Barrack, DPM  TRADJENTA 5 MG TABS tablet Take 5 mg by mouth daily.  05/20/13   Historical Provider, MD    traMADol (ULTRAM) 50 MG tablet TAKE 1 TABLET BY MOUTH EVERY 8 HOURS AS NEEDED FOR FOOT PAIN 10/31/15   Asencion Islam, DPM    Allergies  Allergen Reactions  . Levofloxacin Nausea And Vomiting    Other reaction(s): Confusion    Social History  Substance Use Topics  . Smoking status: Never Smoker   . Smokeless tobacco: Not on file  . Alcohol Use: No    FMHx: cardiac disease  Review of Systems  Constitutional: negative for fevers, chills and malaise Gastrointestinal: negative for diarrhea All other systems reviewed and are negative   Constitutional: in no apparent distress and alert  Filed Vitals:   11/13/15 1406  BP: 182/68  Pulse: 57  Temp: 98.5 F (36.9 C)   EYES: anicteric ENMT: no thrush Cardiovascular: Cor RRR Respiratory: clear Musculoskeletal: peripheral pulses normal, no pedal edema, no clubbing or cyanosis, foot with good granulation tissue, large opening, no pus, no surrounding erythema. Skin: negatives: no rash Hematologic: no cervical lad  Labs: Lab Results  Component Value Date   WBC 7.3 08/08/2014   HGB 10.7* 08/08/2014   HCT 33.5* 08/08/2014   MCV 91.5 08/08/2014   PLT 186 08/08/2014    Lab Results  Component Value Date   CREATININE 1.23* 08/08/2014   BUN 21 08/08/2014   NA 138 08/08/2014   K 4.5 08/08/2014   CL 108 08/08/2014   CO2 24 08/08/2014    Lab Results  Component Value Date   ALT 10 08/08/2014   AST 18 08/08/2014   ALKPHOS 67 08/08/2014   BILITOT 0.4 08/08/2014     Assessment: wound infection, resolved.  Looks good now and no indication for further antibiotics.  Bacteria in cultures covered well by vancomycin and ertapenem.    Plan: 1) stop antibiotics 2) we will alert home health to pull picc line  Thank you for consultation and she will not follow up unless otherwise needed

## 2015-11-14 DIAGNOSIS — E11621 Type 2 diabetes mellitus with foot ulcer: Secondary | ICD-10-CM | POA: Diagnosis not present

## 2015-11-14 DIAGNOSIS — Z7984 Long term (current) use of oral hypoglycemic drugs: Secondary | ICD-10-CM | POA: Diagnosis not present

## 2015-11-14 DIAGNOSIS — N181 Chronic kidney disease, stage 1: Secondary | ICD-10-CM | POA: Diagnosis not present

## 2015-11-14 DIAGNOSIS — L97519 Non-pressure chronic ulcer of other part of right foot with unspecified severity: Secondary | ICD-10-CM | POA: Diagnosis not present

## 2015-11-14 DIAGNOSIS — E1140 Type 2 diabetes mellitus with diabetic neuropathy, unspecified: Secondary | ICD-10-CM | POA: Diagnosis not present

## 2015-11-14 DIAGNOSIS — I350 Nonrheumatic aortic (valve) stenosis: Secondary | ICD-10-CM | POA: Diagnosis not present

## 2015-11-14 DIAGNOSIS — I129 Hypertensive chronic kidney disease with stage 1 through stage 4 chronic kidney disease, or unspecified chronic kidney disease: Secondary | ICD-10-CM | POA: Diagnosis not present

## 2015-11-14 DIAGNOSIS — Z79891 Long term (current) use of opiate analgesic: Secondary | ICD-10-CM | POA: Diagnosis not present

## 2015-11-14 DIAGNOSIS — Z792 Long term (current) use of antibiotics: Secondary | ICD-10-CM | POA: Diagnosis not present

## 2015-11-14 DIAGNOSIS — E1159 Type 2 diabetes mellitus with other circulatory complications: Secondary | ICD-10-CM | POA: Diagnosis not present

## 2015-11-14 DIAGNOSIS — M1990 Unspecified osteoarthritis, unspecified site: Secondary | ICD-10-CM | POA: Diagnosis not present

## 2015-11-14 DIAGNOSIS — D649 Anemia, unspecified: Secondary | ICD-10-CM | POA: Diagnosis not present

## 2015-11-14 DIAGNOSIS — L03115 Cellulitis of right lower limb: Secondary | ICD-10-CM | POA: Diagnosis not present

## 2015-11-15 DIAGNOSIS — I129 Hypertensive chronic kidney disease with stage 1 through stage 4 chronic kidney disease, or unspecified chronic kidney disease: Secondary | ICD-10-CM | POA: Diagnosis not present

## 2015-11-15 DIAGNOSIS — Z7984 Long term (current) use of oral hypoglycemic drugs: Secondary | ICD-10-CM | POA: Diagnosis not present

## 2015-11-15 DIAGNOSIS — I350 Nonrheumatic aortic (valve) stenosis: Secondary | ICD-10-CM | POA: Diagnosis not present

## 2015-11-15 DIAGNOSIS — L97519 Non-pressure chronic ulcer of other part of right foot with unspecified severity: Secondary | ICD-10-CM | POA: Diagnosis not present

## 2015-11-15 DIAGNOSIS — L03115 Cellulitis of right lower limb: Secondary | ICD-10-CM | POA: Diagnosis not present

## 2015-11-15 DIAGNOSIS — D649 Anemia, unspecified: Secondary | ICD-10-CM | POA: Diagnosis not present

## 2015-11-15 DIAGNOSIS — Z792 Long term (current) use of antibiotics: Secondary | ICD-10-CM | POA: Diagnosis not present

## 2015-11-15 DIAGNOSIS — M1990 Unspecified osteoarthritis, unspecified site: Secondary | ICD-10-CM | POA: Diagnosis not present

## 2015-11-15 DIAGNOSIS — E1140 Type 2 diabetes mellitus with diabetic neuropathy, unspecified: Secondary | ICD-10-CM | POA: Diagnosis not present

## 2015-11-15 DIAGNOSIS — Z79891 Long term (current) use of opiate analgesic: Secondary | ICD-10-CM | POA: Diagnosis not present

## 2015-11-15 DIAGNOSIS — N181 Chronic kidney disease, stage 1: Secondary | ICD-10-CM | POA: Diagnosis not present

## 2015-11-15 DIAGNOSIS — E1159 Type 2 diabetes mellitus with other circulatory complications: Secondary | ICD-10-CM | POA: Diagnosis not present

## 2015-11-15 DIAGNOSIS — E11621 Type 2 diabetes mellitus with foot ulcer: Secondary | ICD-10-CM | POA: Diagnosis not present

## 2015-11-16 ENCOUNTER — Ambulatory Visit: Payer: Medicare Other | Admitting: Sports Medicine

## 2015-11-16 ENCOUNTER — Ambulatory Visit (INDEPENDENT_AMBULATORY_CARE_PROVIDER_SITE_OTHER): Payer: Medicare Other | Admitting: Sports Medicine

## 2015-11-16 ENCOUNTER — Encounter: Payer: Self-pay | Admitting: Sports Medicine

## 2015-11-16 DIAGNOSIS — Z9889 Other specified postprocedural states: Secondary | ICD-10-CM | POA: Diagnosis not present

## 2015-11-16 DIAGNOSIS — L97513 Non-pressure chronic ulcer of other part of right foot with necrosis of muscle: Secondary | ICD-10-CM | POA: Diagnosis not present

## 2015-11-16 DIAGNOSIS — M79671 Pain in right foot: Secondary | ICD-10-CM | POA: Diagnosis not present

## 2015-11-16 DIAGNOSIS — E1142 Type 2 diabetes mellitus with diabetic polyneuropathy: Secondary | ICD-10-CM | POA: Diagnosis not present

## 2015-11-16 MED ORDER — TRAMADOL HCL 50 MG PO TABS: 50.0000 mg | ORAL_TABLET | Freq: Three times a day (TID) | ORAL | Status: DC | PRN

## 2015-11-16 NOTE — Patient Instructions (Signed)

## 2015-11-16 NOTE — Progress Notes (Signed)
Patient ID: Emily Miranda, female   DOB: [birth year], 75 y.o.   MRN: [id number]   Subjective: [name] is a 75 y.o. female patient seen in office for follow up evaluation of ulceration on right foot, POV #9 s/p incision and drainage with bone and tissue culture and hospital admission on 09/08/2015 patient has daily home nursing changing dressings and has finished PICC line antibiotics since 11-08-15, was on Ertapenem and Vancomycin. Patient was also seen by infectious disease Dr and PICC line has been removed. Denies any issues at this time.   Reports fasting blood sugar has been good.   Patient Active Problem List   Diagnosis Date Noted  . Wound infection (HCC) 11/13/2015  . Abdominal pain, RLQ (right lower quadrant) -chronic since 2009 06/02/2013  . Constipation, chronic 06/02/2013  . SUI (stress urinary incontinence, female) 06/02/2013  . Recurrent UTI (urinary tract infection) 06/02/2013  . HTN (hypertension)   . CHEST PAIN 11/27/2009  . OVERWEIGHT 11/28/2008  . ESSENTIAL HYPERTENSION, BENIGN 11/28/2008  . DM 11/25/2008  . AORTIC STENOSIS 11/25/2008  . PANCREATITIS, HX OF 11/25/2008   Current Outpatient Prescriptions on File Prior to Visit  Medication Sig Dispense Refill  . amLODipine (NORVASC) 5 MG tablet Take 5 mg by mouth daily.     Marland Kitchen ampicillin (PRINCIPEN) 500 MG capsule Take 1 capsule (500 mg total) by mouth 4 (four) times daily. (Patient not taking: Reported on 11/13/2015) 56 capsule 0  . Ascorbic Acid (VITAMIN C) 100 MG tablet Take 100 mg by mouth daily.      . cephALEXin (KEFLEX) 500 MG capsule Take 1 capsule (500 mg total) by mouth 2 (two) times daily. (Patient not taking: Reported on 11/13/2015) 28 capsule 0  . chlorthalidone (HYGROTON) 25 MG tablet Take 0.5 tablets (12.5 mg total) by mouth daily. 15 tablet 11  . diphenoxylate-atropine (LOMOTIL) 2.5-0.025 MG tablet Take 1 tablet by mouth 4 (four) times daily as needed for diarrhea or loose stools. 30 tablet 0  .  ertapenem 1 g in sodium chloride 0.9 % 50 mL Inject 1 g into the vein daily. 13 Syringe 0  . furosemide (LASIX) 20 MG tablet Take 20 mg by mouth daily. Reported on 11/13/2015    . glimepiride (AMARYL) 4 MG tablet Take 4 mg by mouth daily with breakfast.    . HYDROcodone-acetaminophen (NORCO) 5-325 MG per tablet Take 1 tablet by mouth every 6 (six) hours as needed. 10 tablet 0  . linezolid (ZYVOX) 600 MG/300ML IVPB Inject 300 mLs (600 mg total) into the vein every 12 (twelve) hours. (Patient not taking: Reported on 11/13/2015) 100 mL 23  . lisinopril (PRINIVIL,ZESTRIL) 10 MG tablet Take 10 mg by mouth daily.    . methocarbamol (ROBAXIN) 500 MG tablet Take 1.5 tablets (750 mg total) by mouth every 8 (eight) hours as needed for muscle spasms (use for muscle cramps/pain). (Patient not taking: Reported on 11/13/2015) 30 tablet 2  . naproxen (NAPROSYN) 500 MG tablet Take 1 tablet (500 mg total) by mouth 2 (two) times daily with a meal. (Patient not taking: Reported on 11/13/2015) 40 tablet 1  . nitrofurantoin, macrocrystal-monohydrate, (MACROBID) 100 MG capsule Take 100 mg by mouth 2 (two) times daily.     . ONE TOUCH ULTRA TEST test strip 1 each by Other route once a week.     Marland Kitchen Phenyleph-Doxylamine-DM-APAP (ALKA SELTZER PLUS PO) Take 1 tablet by mouth daily as needed (congestion). Reported on 11/13/2015    . pregabalin (LYRICA) 75 MG capsule  Take 1 capsule (75 mg total) by mouth 2 (two) times daily. (Patient not taking: Reported on 11/13/2015) 60 capsule 1  . sulfamethoxazole-trimethoprim (BACTRIM DS,SEPTRA DS) 800-160 MG tablet Take 1 tablet by mouth 2 (two) times daily. 20 tablet 0  . tetracycline (ACHROMYCIN,SUMYCIN) 500 MG capsule TAKE 1 CAPSULE (500 MG TOTAL) BY MOUTH 2 (TWO) TIMES DAILY. (Patient not taking: Reported on 11/13/2015) 28 capsule 0  . tetracycline (ACHROMYCIN,SUMYCIN) 500 MG capsule Take 1 capsule (500 mg total) by mouth 2 (two) times daily. (Patient not taking: Reported on 11/13/2015) 28 capsule 0  .  TRADJENTA 5 MG TABS tablet Take 5 mg by mouth daily.     . traMADol (ULTRAM) 50 MG tablet TAKE 1 TABLET BY MOUTH EVERY 8 HOURS AS NEEDED FOR FOOT PAIN 30 tablet 0   Current Facility-Administered Medications on File Prior to Visit  Medication Dose Route Frequency Provider Last Rate Last Dose  . triamcinolone acetonide (KENALOG) 10 MG/ML injection 10 mg  10 mg Other Once Asencion Islam, DPM       Allergies  Allergen Reactions  . Levofloxacin Nausea And Vomiting    Other reaction(s): Confusion    Recent Results (from the past 2160 hour(s))  Wound culture     Status: Abnormal   Collection Time: 09/07/15  4:03 PM  Result Value Ref Range   Gram Stain Result Final report    Result 1 Comment     Comment: No white blood cells seen.   RESULT 2 Comment     Comment: Few gram negative rods.   Aerobic Bacterial Culture Final report (A)    Result 1 Proteus mirabilis (A)     Comment: Heavy growth   Result 2 Staphylococcus aureus (A)     Comment: Moderate growth Based on resistance to penicillin and susceptibility to oxacillin this isolate would be susceptible to: * Penicillinase-stable penicillins; such as:     Cloxacillin     Dicloxacillin     Nafcillin * Beta-lactam/beta-lactamase inhibitor combinations; such as:     Amoxicillin-clavulanic acid     Ampicillin-sulbactam * Antistaphylococcal cephems; such as:     Cefaclor     Cefuroxime * Antistaphylococcal carbapenems; such as:     Imipenem     Meropenem    ANTIMICROBIAL SUSCEPTIBILITY Comment     Comment:       ** S = Susceptible; I = Intermediate; R = Resistant **                    P = Positive; N = Negative             MICS are expressed in micrograms per mL    Antibiotic                 RSLT#1    RSLT#2    RSLT#3    RSLT#4 Amoxicillin/Clavulanic Acid    S Ampicillin                     S Cefepime                       S Ceftriaxone                    S Cefuroxime                     S Ciprofloxacin  S          S Clindamycin                              S Ertapenem                      S Erythromycin                             S Gentamicin                     S         S Levofloxacin                   S         S Linezolid                                S Moxifloxacin                             S Oxacillin                                S Penicillin                               R Piperacillin                   S Quinupristin/Dalfopristin                S Rifampin                                 S Tetracycline                   R         S Tobr amycin                     S Trimethoprim/Sulfa             R         S Vancomycin                               S     Objective: There were no vitals filed for this visit.  General: Patient is awake, alert, oriented x 3 and in no acute distress.  No acute changes in physical exam  Dermatology: Skin is warm and dry bilateral with a full thickness ulceration down to the level of muscle that appears to be filling in measuring 7.5 cm in length by approximately 3 cm in width at the widest point extending from the plantar medial midfoot all the way to the hallux (smaller than previous, status post incision and drainage right foot with moderate granular buds and coverage of underlying bone noted same as previous, there is rolled borders at the wound margins with 1+ pitting edema, no warmth, no erythema, no acute cellulitis, no malodor, no active drainage, decreased serous drainage noted on the  inner dressing layers. There is resolved dry blood to be right third toenail with no acute signs of infection.  Vascular: Dorsalis Pedis pulse = 2/4 Bilateral,  Posterior Tibial pulse = 1/4 Bilateral,  Capillary Fill Time < 5 seconds  Neurologic: Epicritic sensation diminished  to the level of ankle using the 5.07/10g [employer].  Musculosketal:  There is no pain with palpation to the ulceration site on right foot, pes planus foot type with  significant talonavicular bulge, right greater than left foot with residual chronic none active Charcot. No pain with compression to calves bilateral.  Strength acceptable for patient status.   Assessment and Plan:  Problem List Items Addressed This Visit    None    Visit Diagnoses    Right foot ulcer, with necrosis of muscle (HCC)    -  Primary    S/P foot surgery, right        Diabetic polyneuropathy associated with type 2 diabetes mellitus (HCC)        Right foot pain          -Examined patient and discussed the progression of the wound and treatment alternatives. -Cleansed ulceration with antimicrobial spray and then treated hypergranular areas with silver nitrate and packed open with dry gauze and applied dry sterile dressing secured with Ace wrap. Daily Nursing orders continue with antimicrobral wound wash and aquacell AG hydrofiber with silver.  -Finished PICC line antibiotics Ertapenem and Vanco on 11-08-15. ID consultation completed and PICC line discontinued  -Discussed with patient misonix debridement,Wound culture, with primary closure with possible drain; Surgical paperwork signed. All risks, benefits, and alternative discussed;No guarantees given or implied; surgical paperwork for [facility] surgery center completed H&P form given to patient dispensed surgical packet with ice pack, scrub brush, and postoperative shoe. -Continue with limited weightbearing to heel only for transfers only with post op shoe and with the assistance of a rolling walker. No excessive walking or standing.  -Patient to return to office in 1 week until surgery is scheduled or sooner if problems or issues arise.  Asencion Islam, DPM

## 2015-11-17 ENCOUNTER — Encounter: Payer: Self-pay | Admitting: Sports Medicine

## 2015-11-17 ENCOUNTER — Telehealth: Payer: Self-pay | Admitting: *Deleted

## 2015-11-17 DIAGNOSIS — Z7984 Long term (current) use of oral hypoglycemic drugs: Secondary | ICD-10-CM | POA: Diagnosis not present

## 2015-11-17 DIAGNOSIS — Z79891 Long term (current) use of opiate analgesic: Secondary | ICD-10-CM | POA: Diagnosis not present

## 2015-11-17 DIAGNOSIS — I350 Nonrheumatic aortic (valve) stenosis: Secondary | ICD-10-CM | POA: Diagnosis not present

## 2015-11-17 DIAGNOSIS — E1140 Type 2 diabetes mellitus with diabetic neuropathy, unspecified: Secondary | ICD-10-CM | POA: Diagnosis not present

## 2015-11-17 DIAGNOSIS — I129 Hypertensive chronic kidney disease with stage 1 through stage 4 chronic kidney disease, or unspecified chronic kidney disease: Secondary | ICD-10-CM | POA: Diagnosis not present

## 2015-11-17 DIAGNOSIS — N181 Chronic kidney disease, stage 1: Secondary | ICD-10-CM | POA: Diagnosis not present

## 2015-11-17 DIAGNOSIS — L97519 Non-pressure chronic ulcer of other part of right foot with unspecified severity: Secondary | ICD-10-CM | POA: Diagnosis not present

## 2015-11-17 DIAGNOSIS — L03115 Cellulitis of right lower limb: Secondary | ICD-10-CM | POA: Diagnosis not present

## 2015-11-17 DIAGNOSIS — M1990 Unspecified osteoarthritis, unspecified site: Secondary | ICD-10-CM | POA: Diagnosis not present

## 2015-11-17 DIAGNOSIS — E1159 Type 2 diabetes mellitus with other circulatory complications: Secondary | ICD-10-CM | POA: Diagnosis not present

## 2015-11-17 DIAGNOSIS — D649 Anemia, unspecified: Secondary | ICD-10-CM | POA: Diagnosis not present

## 2015-11-17 DIAGNOSIS — Z792 Long term (current) use of antibiotics: Secondary | ICD-10-CM | POA: Diagnosis not present

## 2015-11-17 DIAGNOSIS — E11621 Type 2 diabetes mellitus with foot ulcer: Secondary | ICD-10-CM | POA: Diagnosis not present

## 2015-11-17 NOTE — Telephone Encounter (Signed)
"  I saw Dr. Marylene Land yesterday.  She wants me to schedule surgery at [facility]."  Do you have a date in mind?  "She said to talk to you about it.  I don't want this coming Monday but the Monday after would be great."  She can do it on July 17th.  What is she going to be doing?  "I have an Ulcer that she has been treating.  I've had 2 other surgeries on it already.  This has been going on since December.  So, she's going to clean it out and put sutures in it to close it."  Did you get your history and physical form to be completed by your primary care doctor?  "Yes, I did.  I already tried to call and schedule an appointment.  I may have to see his PA because he's out for a week."  That's fine, as long as a physical is done and form is completed.  "What time will I need to be there?"  You'll receive a call from the surgical center.  "Okay, thank you."

## 2015-11-18 DIAGNOSIS — I350 Nonrheumatic aortic (valve) stenosis: Secondary | ICD-10-CM | POA: Diagnosis not present

## 2015-11-18 DIAGNOSIS — D649 Anemia, unspecified: Secondary | ICD-10-CM | POA: Diagnosis not present

## 2015-11-18 DIAGNOSIS — E1159 Type 2 diabetes mellitus with other circulatory complications: Secondary | ICD-10-CM | POA: Diagnosis not present

## 2015-11-18 DIAGNOSIS — Z7984 Long term (current) use of oral hypoglycemic drugs: Secondary | ICD-10-CM | POA: Diagnosis not present

## 2015-11-18 DIAGNOSIS — L03115 Cellulitis of right lower limb: Secondary | ICD-10-CM | POA: Diagnosis not present

## 2015-11-18 DIAGNOSIS — L97519 Non-pressure chronic ulcer of other part of right foot with unspecified severity: Secondary | ICD-10-CM | POA: Diagnosis not present

## 2015-11-18 DIAGNOSIS — Z79891 Long term (current) use of opiate analgesic: Secondary | ICD-10-CM | POA: Diagnosis not present

## 2015-11-18 DIAGNOSIS — Z792 Long term (current) use of antibiotics: Secondary | ICD-10-CM | POA: Diagnosis not present

## 2015-11-18 DIAGNOSIS — E1140 Type 2 diabetes mellitus with diabetic neuropathy, unspecified: Secondary | ICD-10-CM | POA: Diagnosis not present

## 2015-11-18 DIAGNOSIS — N181 Chronic kidney disease, stage 1: Secondary | ICD-10-CM | POA: Diagnosis not present

## 2015-11-18 DIAGNOSIS — M1990 Unspecified osteoarthritis, unspecified site: Secondary | ICD-10-CM | POA: Diagnosis not present

## 2015-11-18 DIAGNOSIS — E11621 Type 2 diabetes mellitus with foot ulcer: Secondary | ICD-10-CM | POA: Diagnosis not present

## 2015-11-18 DIAGNOSIS — I129 Hypertensive chronic kidney disease with stage 1 through stage 4 chronic kidney disease, or unspecified chronic kidney disease: Secondary | ICD-10-CM | POA: Diagnosis not present

## 2015-11-19 DIAGNOSIS — Z7984 Long term (current) use of oral hypoglycemic drugs: Secondary | ICD-10-CM | POA: Diagnosis not present

## 2015-11-19 DIAGNOSIS — Z79891 Long term (current) use of opiate analgesic: Secondary | ICD-10-CM | POA: Diagnosis not present

## 2015-11-19 DIAGNOSIS — I129 Hypertensive chronic kidney disease with stage 1 through stage 4 chronic kidney disease, or unspecified chronic kidney disease: Secondary | ICD-10-CM | POA: Diagnosis not present

## 2015-11-19 DIAGNOSIS — Z792 Long term (current) use of antibiotics: Secondary | ICD-10-CM | POA: Diagnosis not present

## 2015-11-19 DIAGNOSIS — I350 Nonrheumatic aortic (valve) stenosis: Secondary | ICD-10-CM | POA: Diagnosis not present

## 2015-11-19 DIAGNOSIS — E11621 Type 2 diabetes mellitus with foot ulcer: Secondary | ICD-10-CM | POA: Diagnosis not present

## 2015-11-19 DIAGNOSIS — D649 Anemia, unspecified: Secondary | ICD-10-CM | POA: Diagnosis not present

## 2015-11-19 DIAGNOSIS — E1140 Type 2 diabetes mellitus with diabetic neuropathy, unspecified: Secondary | ICD-10-CM | POA: Diagnosis not present

## 2015-11-19 DIAGNOSIS — L03115 Cellulitis of right lower limb: Secondary | ICD-10-CM | POA: Diagnosis not present

## 2015-11-19 DIAGNOSIS — M1990 Unspecified osteoarthritis, unspecified site: Secondary | ICD-10-CM | POA: Diagnosis not present

## 2015-11-19 DIAGNOSIS — N181 Chronic kidney disease, stage 1: Secondary | ICD-10-CM | POA: Diagnosis not present

## 2015-11-19 DIAGNOSIS — L97519 Non-pressure chronic ulcer of other part of right foot with unspecified severity: Secondary | ICD-10-CM | POA: Diagnosis not present

## 2015-11-19 DIAGNOSIS — E1159 Type 2 diabetes mellitus with other circulatory complications: Secondary | ICD-10-CM | POA: Diagnosis not present

## 2015-11-20 DIAGNOSIS — E1159 Type 2 diabetes mellitus with other circulatory complications: Secondary | ICD-10-CM | POA: Diagnosis not present

## 2015-11-20 DIAGNOSIS — E11621 Type 2 diabetes mellitus with foot ulcer: Secondary | ICD-10-CM | POA: Diagnosis not present

## 2015-11-20 DIAGNOSIS — I350 Nonrheumatic aortic (valve) stenosis: Secondary | ICD-10-CM | POA: Diagnosis not present

## 2015-11-20 DIAGNOSIS — I129 Hypertensive chronic kidney disease with stage 1 through stage 4 chronic kidney disease, or unspecified chronic kidney disease: Secondary | ICD-10-CM | POA: Diagnosis not present

## 2015-11-20 DIAGNOSIS — L03115 Cellulitis of right lower limb: Secondary | ICD-10-CM | POA: Diagnosis not present

## 2015-11-20 DIAGNOSIS — Z792 Long term (current) use of antibiotics: Secondary | ICD-10-CM | POA: Diagnosis not present

## 2015-11-20 DIAGNOSIS — N181 Chronic kidney disease, stage 1: Secondary | ICD-10-CM | POA: Diagnosis not present

## 2015-11-20 DIAGNOSIS — D649 Anemia, unspecified: Secondary | ICD-10-CM | POA: Diagnosis not present

## 2015-11-20 DIAGNOSIS — E1140 Type 2 diabetes mellitus with diabetic neuropathy, unspecified: Secondary | ICD-10-CM | POA: Diagnosis not present

## 2015-11-20 DIAGNOSIS — M1990 Unspecified osteoarthritis, unspecified site: Secondary | ICD-10-CM | POA: Diagnosis not present

## 2015-11-20 DIAGNOSIS — L97519 Non-pressure chronic ulcer of other part of right foot with unspecified severity: Secondary | ICD-10-CM | POA: Diagnosis not present

## 2015-11-20 DIAGNOSIS — Z7984 Long term (current) use of oral hypoglycemic drugs: Secondary | ICD-10-CM | POA: Diagnosis not present

## 2015-11-20 DIAGNOSIS — Z79891 Long term (current) use of opiate analgesic: Secondary | ICD-10-CM | POA: Diagnosis not present

## 2015-11-21 DIAGNOSIS — Z7984 Long term (current) use of oral hypoglycemic drugs: Secondary | ICD-10-CM | POA: Diagnosis not present

## 2015-11-21 DIAGNOSIS — L03115 Cellulitis of right lower limb: Secondary | ICD-10-CM | POA: Diagnosis not present

## 2015-11-21 DIAGNOSIS — M1990 Unspecified osteoarthritis, unspecified site: Secondary | ICD-10-CM | POA: Diagnosis not present

## 2015-11-21 DIAGNOSIS — Z792 Long term (current) use of antibiotics: Secondary | ICD-10-CM | POA: Diagnosis not present

## 2015-11-21 DIAGNOSIS — Z79891 Long term (current) use of opiate analgesic: Secondary | ICD-10-CM | POA: Diagnosis not present

## 2015-11-21 DIAGNOSIS — I350 Nonrheumatic aortic (valve) stenosis: Secondary | ICD-10-CM | POA: Diagnosis not present

## 2015-11-21 DIAGNOSIS — D649 Anemia, unspecified: Secondary | ICD-10-CM | POA: Diagnosis not present

## 2015-11-21 DIAGNOSIS — N181 Chronic kidney disease, stage 1: Secondary | ICD-10-CM | POA: Diagnosis not present

## 2015-11-21 DIAGNOSIS — I129 Hypertensive chronic kidney disease with stage 1 through stage 4 chronic kidney disease, or unspecified chronic kidney disease: Secondary | ICD-10-CM | POA: Diagnosis not present

## 2015-11-21 DIAGNOSIS — L97519 Non-pressure chronic ulcer of other part of right foot with unspecified severity: Secondary | ICD-10-CM | POA: Diagnosis not present

## 2015-11-21 DIAGNOSIS — E1140 Type 2 diabetes mellitus with diabetic neuropathy, unspecified: Secondary | ICD-10-CM | POA: Diagnosis not present

## 2015-11-21 DIAGNOSIS — E11621 Type 2 diabetes mellitus with foot ulcer: Secondary | ICD-10-CM | POA: Diagnosis not present

## 2015-11-21 DIAGNOSIS — E1159 Type 2 diabetes mellitus with other circulatory complications: Secondary | ICD-10-CM | POA: Diagnosis not present

## 2015-11-22 ENCOUNTER — Ambulatory Visit (INDEPENDENT_AMBULATORY_CARE_PROVIDER_SITE_OTHER): Payer: Medicare Other | Admitting: Sports Medicine

## 2015-11-22 ENCOUNTER — Encounter: Payer: Self-pay | Admitting: Sports Medicine

## 2015-11-22 DIAGNOSIS — E1142 Type 2 diabetes mellitus with diabetic polyneuropathy: Secondary | ICD-10-CM

## 2015-11-22 DIAGNOSIS — Z9889 Other specified postprocedural states: Secondary | ICD-10-CM

## 2015-11-22 DIAGNOSIS — L97513 Non-pressure chronic ulcer of other part of right foot with necrosis of muscle: Secondary | ICD-10-CM

## 2015-11-22 NOTE — Progress Notes (Signed)
Patient ID: Emily Miranda, female   DOB: 03-21-1941, 75 y.o.   MRN: [id number]   Subjective: Emily Miranda is a 75 y.o. female patient seen in office for follow up evaluation of ulceration on right foot, POV #10 s/p incision and drainage with bone and tissue culture and hospital admission on 09/08/2015 patient has daily home nursing changing dressings and has finished PICC line antibiotics since 11-08-15, was on Ertapenem and Vancomycin. Patient was also seen by infectious disease Dr and PICC line has been removed. Patient is to go to her primary care doctor on Friday for H&P and is scheduled for surgery Monday. Denies any issues at this time.   Reports fasting blood sugar has been good.   Patient Active Problem List   Diagnosis Date Noted  . Wound infection (HCC) 11/13/2015  . Abdominal pain, RLQ (right lower quadrant) -chronic since 2009 06/02/2013  . Constipation, chronic 06/02/2013  . SUI (stress urinary incontinence, female) 06/02/2013  . Recurrent UTI (urinary tract infection) 06/02/2013  . HTN (hypertension)   . CHEST PAIN 11/27/2009  . OVERWEIGHT 11/28/2008  . ESSENTIAL HYPERTENSION, BENIGN 11/28/2008  . DM 11/25/2008  . AORTIC STENOSIS 11/25/2008  . PANCREATITIS, HX OF 11/25/2008   Current Outpatient Prescriptions on File Prior to Visit  Medication Sig Dispense Refill  . amLODipine (NORVASC) 5 MG tablet Take 5 mg by mouth daily.     Marland Kitchen ampicillin (PRINCIPEN) 500 MG capsule Take 1 capsule (500 mg total) by mouth 4 (four) times daily. (Patient not taking: Reported on 11/13/2015) 56 capsule 0  . Ascorbic Acid (VITAMIN C) 100 MG tablet Take 100 mg by mouth daily.      . cephALEXin (KEFLEX) 500 MG capsule Take 1 capsule (500 mg total) by mouth 2 (two) times daily. (Patient not taking: Reported on 11/13/2015) 28 capsule 0  . chlorthalidone (HYGROTON) 25 MG tablet Take 0.5 tablets (12.5 mg total) by mouth daily. 15 tablet 11  . diphenoxylate-atropine (LOMOTIL) 2.5-0.025 MG tablet Take  1 tablet by mouth 4 (four) times daily as needed for diarrhea or loose stools. 30 tablet 0  . ertapenem 1 g in sodium chloride 0.9 % 50 mL Inject 1 g into the vein daily. 13 Syringe 0  . furosemide (LASIX) 20 MG tablet Take 20 mg by mouth daily. Reported on 11/13/2015    . glimepiride (AMARYL) 4 MG tablet Take 4 mg by mouth daily with breakfast.    . HYDROcodone-acetaminophen (NORCO) 5-325 MG per tablet Take 1 tablet by mouth every 6 (six) hours as needed. 10 tablet 0  . linezolid (ZYVOX) 600 MG/300ML IVPB Inject 300 mLs (600 mg total) into the vein every 12 (twelve) hours. (Patient not taking: Reported on 11/13/2015) 100 mL 23  . lisinopril (PRINIVIL,ZESTRIL) 10 MG tablet Take 10 mg by mouth daily.    . methocarbamol (ROBAXIN) 500 MG tablet Take 1.5 tablets (750 mg total) by mouth every 8 (eight) hours as needed for muscle spasms (use for muscle cramps/pain). (Patient not taking: Reported on 11/13/2015) 30 tablet 2  . naproxen (NAPROSYN) 500 MG tablet Take 1 tablet (500 mg total) by mouth 2 (two) times daily with a meal. (Patient not taking: Reported on 11/13/2015) 40 tablet 1  . nitrofurantoin, macrocrystal-monohydrate, (MACROBID) 100 MG capsule Take 100 mg by mouth 2 (two) times daily.     . ONE TOUCH ULTRA TEST test strip 1 each by Other route once a week.     Marland Kitchen Phenyleph-Doxylamine-DM-APAP (ALKA SELTZER PLUS PO) Take 1  tablet by mouth daily as needed (congestion). Reported on 11/13/2015    . pregabalin (LYRICA) 75 MG capsule Take 1 capsule (75 mg total) by mouth 2 (two) times daily. (Patient not taking: Reported on 11/13/2015) 60 capsule 1  . sulfamethoxazole-trimethoprim (BACTRIM DS,SEPTRA DS) 800-160 MG tablet Take 1 tablet by mouth 2 (two) times daily. 20 tablet 0  . tetracycline (ACHROMYCIN,SUMYCIN) 500 MG capsule TAKE 1 CAPSULE (500 MG TOTAL) BY MOUTH 2 (TWO) TIMES DAILY. (Patient not taking: Reported on 11/13/2015) 28 capsule 0  . tetracycline (ACHROMYCIN,SUMYCIN) 500 MG capsule Take 1 capsule (500 mg  total) by mouth 2 (two) times daily. (Patient not taking: Reported on 11/13/2015) 28 capsule 0  . TRADJENTA 5 MG TABS tablet Take 5 mg by mouth daily.     . traMADol (ULTRAM) 50 MG tablet Take 1 tablet (50 mg total) by mouth every 8 (eight) hours as needed. 30 tablet 0   Current Facility-Administered Medications on File Prior to Visit  Medication Dose Route Frequency Provider Last Rate Last Dose  . triamcinolone acetonide (KENALOG) 10 MG/ML injection 10 mg  10 mg Other Once Asencion Islam, DPM       Allergies  Allergen Reactions  . Levofloxacin Nausea And Vomiting    Other reaction(s): Confusion    Recent Results (from the past 2160 hour(s))  Wound culture     Status: Abnormal   Collection Time: 09/07/15  4:03 PM  Result Value Ref Range   Gram Stain Result Final report    Result 1 Comment     Comment: No white blood cells seen.   RESULT 2 Comment     Comment: Few gram negative rods.   Aerobic Bacterial Culture Final report (A)    Result 1 Proteus mirabilis (A)     Comment: Heavy growth   Result 2 Staphylococcus aureus (A)     Comment: Moderate growth Based on resistance to penicillin and susceptibility to oxacillin this isolate would be susceptible to: * Penicillinase-stable penicillins; such as:     Cloxacillin     Dicloxacillin     Nafcillin * Beta-lactam/beta-lactamase inhibitor combinations; such as:     Amoxicillin-clavulanic acid     Ampicillin-sulbactam * Antistaphylococcal cephems; such as:     Cefaclor     Cefuroxime * Antistaphylococcal carbapenems; such as:     Imipenem     Meropenem    ANTIMICROBIAL SUSCEPTIBILITY Comment     Comment:       ** S = Susceptible; I = Intermediate; R = Resistant **                    P = Positive; N = Negative             MICS are expressed in micrograms per mL    Antibiotic                 RSLT#1    RSLT#2    RSLT#3    RSLT#4 Amoxicillin/Clavulanic Acid    S Ampicillin                     S Cefepime                        S Ceftriaxone                    S Cefuroxime  S Ciprofloxacin                  S         S Clindamycin                              S Ertapenem                      S Erythromycin                             S Gentamicin                     S         S Levofloxacin                   S         S Linezolid                                S Moxifloxacin                             S Oxacillin                                S Penicillin                               R Piperacillin                   S Quinupristin/Dalfopristin                S Rifampin                                 S Tetracycline                   R         S Tobr amycin                     S Trimethoprim/Sulfa             R         S Vancomycin                               S     Objective: There were no vitals filed for this visit.  General: Patient is awake, alert, oriented x 3 and in no acute distress.  No acute changes in physical exam  Dermatology: Skin is warm and dry bilateral with a full thickness ulceration down to the level of muscle that appears to be filling in measuring 7.5 cm in length by approximately 3 cm in width at the widest point extending from the plantar medial midfoot all the way to the hallux (smaller than previous, status post incision and drainage right foot with moderate granular buds and coverage of underlying bone noted same as previous, there is rolled borders at the wound margins with 1+ pitting edema,  no warmth, no erythema, no acute cellulitis, no malodor, no active drainage, decreased serous drainage noted on the inner dressing layers. There is resolved dry blood to be right third toenail with no acute signs of infection.  Vascular: Dorsalis Pedis pulse = 2/4 Bilateral,  Posterior Tibial pulse = 1/4 Bilateral,  Capillary Fill Time < 5 seconds  Neurologic: Epicritic sensation diminished  to the level of ankle using the 5.07/10g [employer].  Musculosketal:  There is no pain with palpation to the ulceration site on right foot, pes planus foot type with significant talonavicular bulge, right greater than left foot with residual chronic none active Charcot. No pain with compression to calves bilateral.  Strength acceptable for patient status.   Assessment and Plan:  Problem List Items Addressed This Visit    None    Visit Diagnoses    Right foot ulcer, with necrosis of muscle (HCC)    -  Primary    S/P foot surgery, right        Diabetic polyneuropathy associated with type 2 diabetes mellitus (HCC)          -Examined patient and discussed the progression of the wound and treatment alternatives. -Cleansed ulceration with antimicrobial spray and then treated hypergranular areas with silver nitrate and packed open with dry gauze and applied dry sterile dressing secured with Ace wrap. Daily Nursing orders continue with antimicrobral wound wash and aquacell AG hydrofiber with silver. Will likely re-order home nursing after surgery to allow continued care for patient to prevent re-ulceration or complications postoperatively.  -Re-Discussed with patient misonix debridement,Wound culture, with primary closure with possible drain;Pt scheduled for surgery on Monday -Continue with limited weightbearing to heel only for transfers only with post op shoe and with the assistance of a rolling walker. No excessive walking or standing.  -Patient to return to office after surgery or sooner if problems or issues arise.  Asencion Islam, DPM

## 2015-11-22 NOTE — Patient Instructions (Signed)
Pre-Operative Instructions  Congratulations, you have decided to take an important step to improving your quality of life.  You can be assured that the doctors of [facility] will be with you every step of the way.  1. Plan to be at the surgery center/hospital at least 1 (one) hour prior to your scheduled time unless otherwise directed by the surgical center/hospital staff.  You must have a responsible adult accompany you, remain during the surgery and drive you home.  Make sure you have directions to the surgical center/hospital and know how to get there on time. 2. For hospital based surgery you will need to obtain a history and physical form from your family physician within 1 month prior to the date of surgery- we will give you a form for you primary physician.  3. We make every effort to accommodate the date you request for surgery.  There are however, times where surgery dates or times have to be moved.  We will contact you as soon as possible if a change in schedule is required.   4. No Aspirin/Ibuprofen for one week before surgery.  If you are on aspirin, any non-steroidal anti-inflammatory medications (Mobic, Aleve, Ibuprofen) you should stop taking it 7 days prior to your surgery.  You make take Tylenol  For pain prior to surgery.  5. Medications- If you are taking daily heart and blood pressure medications, seizure, reflux, allergy, asthma, anxiety, pain or diabetes medications, make sure the surgery center/hospital is aware before the day of surgery so they may notify you which medications to take or avoid the day of surgery. 6. No food or drink after midnight the night before surgery unless directed otherwise by surgical center/hospital staff. 7. No alcoholic beverages 24 hours prior to surgery.  No smoking 24 hours prior to or 24 hours after surgery. 8. Wear loose pants or shorts- loose enough to fit over bandages, boots, and casts. 9. No slip on shoes, sneakers are best. 10. Bring  your boot with you to the surgery center/hospital.  Also bring crutches or a walker if your physician has prescribed it for you.  If you do not have this equipment, it will be provided for you after surgery. 11. If you have not been contracted by the surgery center/hospital by the day before your surgery, call to confirm the date and time of your surgery. 12. Leave-time from work may vary depending on the type of surgery you have.  Appropriate arrangements should be made prior to surgery with your employer. 13. Prescriptions will be provided immediately following surgery by your doctor.  Have these filled as soon as possible after surgery and take the medication as directed. 14. Remove nail polish on the operative foot. 15. Wash the night before surgery.  The night before surgery wash the foot and leg well with the antibacterial soap provided and water paying special attention to beneath the toenails and in between the toes.  Rinse thoroughly with water and dry well with a towel.  Perform this wash unless told not to do so by your physician.  Enclosed: 1 Ice pack (please put in freezer the night before surgery)   1 Hibiclens skin cleaner   Pre-op Instructions  If you have any questions regarding the instructions, do not hesitate to call our office.  [place]: [address] [place], [place] [address] [phone]  [place]: [address] [place], [place] [address] [phone]  [place]: [address]  [place],  [address] [phone]   Dr.   Norman Regal DPM, Dr. Matthew Wagoner DPM, Dr. M. Todd Hyatt DPM, Dr. Rosalinda Seaman DPM 

## 2015-11-23 ENCOUNTER — Ambulatory Visit: Payer: Medicare Other | Admitting: Sports Medicine

## 2015-11-23 DIAGNOSIS — M1990 Unspecified osteoarthritis, unspecified site: Secondary | ICD-10-CM | POA: Diagnosis not present

## 2015-11-23 DIAGNOSIS — Z79891 Long term (current) use of opiate analgesic: Secondary | ICD-10-CM | POA: Diagnosis not present

## 2015-11-23 DIAGNOSIS — Z7984 Long term (current) use of oral hypoglycemic drugs: Secondary | ICD-10-CM | POA: Diagnosis not present

## 2015-11-23 DIAGNOSIS — E1140 Type 2 diabetes mellitus with diabetic neuropathy, unspecified: Secondary | ICD-10-CM | POA: Diagnosis not present

## 2015-11-23 DIAGNOSIS — E11621 Type 2 diabetes mellitus with foot ulcer: Secondary | ICD-10-CM | POA: Diagnosis not present

## 2015-11-23 DIAGNOSIS — I350 Nonrheumatic aortic (valve) stenosis: Secondary | ICD-10-CM | POA: Diagnosis not present

## 2015-11-23 DIAGNOSIS — I129 Hypertensive chronic kidney disease with stage 1 through stage 4 chronic kidney disease, or unspecified chronic kidney disease: Secondary | ICD-10-CM | POA: Diagnosis not present

## 2015-11-23 DIAGNOSIS — L97519 Non-pressure chronic ulcer of other part of right foot with unspecified severity: Secondary | ICD-10-CM | POA: Diagnosis not present

## 2015-11-23 DIAGNOSIS — N181 Chronic kidney disease, stage 1: Secondary | ICD-10-CM | POA: Diagnosis not present

## 2015-11-23 DIAGNOSIS — E1159 Type 2 diabetes mellitus with other circulatory complications: Secondary | ICD-10-CM | POA: Diagnosis not present

## 2015-11-23 DIAGNOSIS — Z792 Long term (current) use of antibiotics: Secondary | ICD-10-CM | POA: Diagnosis not present

## 2015-11-23 DIAGNOSIS — D649 Anemia, unspecified: Secondary | ICD-10-CM | POA: Diagnosis not present

## 2015-11-23 DIAGNOSIS — L03115 Cellulitis of right lower limb: Secondary | ICD-10-CM | POA: Diagnosis not present

## 2015-11-24 ENCOUNTER — Encounter: Payer: Self-pay | Admitting: Sports Medicine

## 2015-11-24 DIAGNOSIS — N181 Chronic kidney disease, stage 1: Secondary | ICD-10-CM | POA: Diagnosis not present

## 2015-11-24 DIAGNOSIS — M1990 Unspecified osteoarthritis, unspecified site: Secondary | ICD-10-CM | POA: Diagnosis not present

## 2015-11-24 DIAGNOSIS — E1159 Type 2 diabetes mellitus with other circulatory complications: Secondary | ICD-10-CM | POA: Diagnosis not present

## 2015-11-24 DIAGNOSIS — Z792 Long term (current) use of antibiotics: Secondary | ICD-10-CM | POA: Diagnosis not present

## 2015-11-24 DIAGNOSIS — I10 Essential (primary) hypertension: Secondary | ICD-10-CM | POA: Diagnosis not present

## 2015-11-24 DIAGNOSIS — E1140 Type 2 diabetes mellitus with diabetic neuropathy, unspecified: Secondary | ICD-10-CM | POA: Diagnosis not present

## 2015-11-24 DIAGNOSIS — L97519 Non-pressure chronic ulcer of other part of right foot with unspecified severity: Secondary | ICD-10-CM | POA: Diagnosis not present

## 2015-11-24 DIAGNOSIS — E785 Hyperlipidemia, unspecified: Secondary | ICD-10-CM | POA: Diagnosis not present

## 2015-11-24 DIAGNOSIS — Z0000 Encounter for general adult medical examination without abnormal findings: Secondary | ICD-10-CM | POA: Diagnosis not present

## 2015-11-24 DIAGNOSIS — Z7984 Long term (current) use of oral hypoglycemic drugs: Secondary | ICD-10-CM | POA: Diagnosis not present

## 2015-11-24 DIAGNOSIS — D649 Anemia, unspecified: Secondary | ICD-10-CM | POA: Diagnosis not present

## 2015-11-24 DIAGNOSIS — E11621 Type 2 diabetes mellitus with foot ulcer: Secondary | ICD-10-CM | POA: Diagnosis not present

## 2015-11-24 DIAGNOSIS — Z79899 Other long term (current) drug therapy: Secondary | ICD-10-CM | POA: Diagnosis not present

## 2015-11-24 DIAGNOSIS — L03115 Cellulitis of right lower limb: Secondary | ICD-10-CM | POA: Diagnosis not present

## 2015-11-24 DIAGNOSIS — I129 Hypertensive chronic kidney disease with stage 1 through stage 4 chronic kidney disease, or unspecified chronic kidney disease: Secondary | ICD-10-CM | POA: Diagnosis not present

## 2015-11-24 DIAGNOSIS — Z79891 Long term (current) use of opiate analgesic: Secondary | ICD-10-CM | POA: Diagnosis not present

## 2015-11-24 DIAGNOSIS — I350 Nonrheumatic aortic (valve) stenosis: Secondary | ICD-10-CM | POA: Diagnosis not present

## 2015-11-25 DIAGNOSIS — M1990 Unspecified osteoarthritis, unspecified site: Secondary | ICD-10-CM | POA: Diagnosis not present

## 2015-11-25 DIAGNOSIS — E1140 Type 2 diabetes mellitus with diabetic neuropathy, unspecified: Secondary | ICD-10-CM | POA: Diagnosis not present

## 2015-11-25 DIAGNOSIS — N181 Chronic kidney disease, stage 1: Secondary | ICD-10-CM | POA: Diagnosis not present

## 2015-11-25 DIAGNOSIS — I350 Nonrheumatic aortic (valve) stenosis: Secondary | ICD-10-CM | POA: Diagnosis not present

## 2015-11-25 DIAGNOSIS — E11621 Type 2 diabetes mellitus with foot ulcer: Secondary | ICD-10-CM | POA: Diagnosis not present

## 2015-11-25 DIAGNOSIS — Z79891 Long term (current) use of opiate analgesic: Secondary | ICD-10-CM | POA: Diagnosis not present

## 2015-11-25 DIAGNOSIS — Z7984 Long term (current) use of oral hypoglycemic drugs: Secondary | ICD-10-CM | POA: Diagnosis not present

## 2015-11-25 DIAGNOSIS — L97519 Non-pressure chronic ulcer of other part of right foot with unspecified severity: Secondary | ICD-10-CM | POA: Diagnosis not present

## 2015-11-25 DIAGNOSIS — D649 Anemia, unspecified: Secondary | ICD-10-CM | POA: Diagnosis not present

## 2015-11-25 DIAGNOSIS — Z792 Long term (current) use of antibiotics: Secondary | ICD-10-CM | POA: Diagnosis not present

## 2015-11-25 DIAGNOSIS — I129 Hypertensive chronic kidney disease with stage 1 through stage 4 chronic kidney disease, or unspecified chronic kidney disease: Secondary | ICD-10-CM | POA: Diagnosis not present

## 2015-11-25 DIAGNOSIS — L03115 Cellulitis of right lower limb: Secondary | ICD-10-CM | POA: Diagnosis not present

## 2015-11-25 DIAGNOSIS — E1159 Type 2 diabetes mellitus with other circulatory complications: Secondary | ICD-10-CM | POA: Diagnosis not present

## 2015-11-26 DIAGNOSIS — I129 Hypertensive chronic kidney disease with stage 1 through stage 4 chronic kidney disease, or unspecified chronic kidney disease: Secondary | ICD-10-CM | POA: Diagnosis not present

## 2015-11-26 DIAGNOSIS — L03115 Cellulitis of right lower limb: Secondary | ICD-10-CM | POA: Diagnosis not present

## 2015-11-26 DIAGNOSIS — D649 Anemia, unspecified: Secondary | ICD-10-CM | POA: Diagnosis not present

## 2015-11-26 DIAGNOSIS — E11621 Type 2 diabetes mellitus with foot ulcer: Secondary | ICD-10-CM | POA: Diagnosis not present

## 2015-11-26 DIAGNOSIS — L97519 Non-pressure chronic ulcer of other part of right foot with unspecified severity: Secondary | ICD-10-CM | POA: Diagnosis not present

## 2015-11-26 DIAGNOSIS — N181 Chronic kidney disease, stage 1: Secondary | ICD-10-CM | POA: Diagnosis not present

## 2015-11-26 DIAGNOSIS — Z7984 Long term (current) use of oral hypoglycemic drugs: Secondary | ICD-10-CM | POA: Diagnosis not present

## 2015-11-26 DIAGNOSIS — E1140 Type 2 diabetes mellitus with diabetic neuropathy, unspecified: Secondary | ICD-10-CM | POA: Diagnosis not present

## 2015-11-26 DIAGNOSIS — Z792 Long term (current) use of antibiotics: Secondary | ICD-10-CM | POA: Diagnosis not present

## 2015-11-26 DIAGNOSIS — Z79891 Long term (current) use of opiate analgesic: Secondary | ICD-10-CM | POA: Diagnosis not present

## 2015-11-26 DIAGNOSIS — M1990 Unspecified osteoarthritis, unspecified site: Secondary | ICD-10-CM | POA: Diagnosis not present

## 2015-11-26 DIAGNOSIS — I350 Nonrheumatic aortic (valve) stenosis: Secondary | ICD-10-CM | POA: Diagnosis not present

## 2015-11-26 DIAGNOSIS — E1159 Type 2 diabetes mellitus with other circulatory complications: Secondary | ICD-10-CM | POA: Diagnosis not present

## 2015-11-27 ENCOUNTER — Telehealth: Payer: Self-pay | Admitting: *Deleted

## 2015-11-27 DIAGNOSIS — E13621 Other specified diabetes mellitus with foot ulcer: Secondary | ICD-10-CM | POA: Diagnosis not present

## 2015-11-27 DIAGNOSIS — L97519 Non-pressure chronic ulcer of other part of right foot with unspecified severity: Secondary | ICD-10-CM | POA: Diagnosis not present

## 2015-11-27 DIAGNOSIS — L929 Granulomatous disorder of the skin and subcutaneous tissue, unspecified: Secondary | ICD-10-CM | POA: Diagnosis not present

## 2015-11-27 DIAGNOSIS — N189 Chronic kidney disease, unspecified: Secondary | ICD-10-CM | POA: Diagnosis not present

## 2015-11-27 DIAGNOSIS — E1322 Other specified diabetes mellitus with diabetic chronic kidney disease: Secondary | ICD-10-CM | POA: Diagnosis not present

## 2015-11-27 DIAGNOSIS — E11621 Type 2 diabetes mellitus with foot ulcer: Secondary | ICD-10-CM | POA: Diagnosis not present

## 2015-11-27 NOTE — Telephone Encounter (Signed)
Called patient back in regards to drain coming out. Patient reports that she got up to walk and blood was coming through the dressing so her husband changed the dressing and accidentally pulled out the drain. I advised patient to re-dress with dry dressing and ACE wrap and to limit walking and to ice and elevate as instructed. Patient to have home nursing check dressing on tomorrow and to call office if there is any more problems or issues.  -Dr. Marylene Land

## 2015-11-27 NOTE — Telephone Encounter (Addendum)
-----   Message from Asencion Islam, [place] sent at 11/27/2015 10:03 AM EDT ----- Regarding: Change Wound Care Home Nursing Orders Patient has surgery this morning. Right foot wound debridement with closure and placement of penrose drain.   Wound Care orders: On Tomorrow 11-28-15 pull penrose drain and dress area with acticoat absorbant (lay flat along the incision) covered with 4x4, abd, kerlix, and ACE thereafter daily wound care should consist of acticoat absorbant (as long as there is drainage present, may discontinue when no drainage is seen on inner dressing layers), 4x4, abd, kerlix, ACE wrap. Patient to follow up with me in office on 12-06-15 for continued post op care.  Thanks Dr. Marylene Land.  11/27/2015-Orders called to Lupita Leash [facility]. Orders faxed to [facility]. 11/27/2015-Cheryl - [facility] states pt's drain came out, blood soaked the dressing, the dressing came off and there is blood on the floor.  [facility] phone would not dial out.  Dr. Marylene Land gave instructions by her cellphone.

## 2015-11-28 ENCOUNTER — Telehealth: Payer: Self-pay | Admitting: Sports Medicine

## 2015-11-28 ENCOUNTER — Telehealth: Payer: Self-pay | Admitting: *Deleted

## 2015-11-28 DIAGNOSIS — I129 Hypertensive chronic kidney disease with stage 1 through stage 4 chronic kidney disease, or unspecified chronic kidney disease: Secondary | ICD-10-CM | POA: Diagnosis not present

## 2015-11-28 DIAGNOSIS — Z79891 Long term (current) use of opiate analgesic: Secondary | ICD-10-CM | POA: Diagnosis not present

## 2015-11-28 DIAGNOSIS — I350 Nonrheumatic aortic (valve) stenosis: Secondary | ICD-10-CM | POA: Diagnosis not present

## 2015-11-28 DIAGNOSIS — Z792 Long term (current) use of antibiotics: Secondary | ICD-10-CM | POA: Diagnosis not present

## 2015-11-28 DIAGNOSIS — Z7984 Long term (current) use of oral hypoglycemic drugs: Secondary | ICD-10-CM | POA: Diagnosis not present

## 2015-11-28 DIAGNOSIS — E1140 Type 2 diabetes mellitus with diabetic neuropathy, unspecified: Secondary | ICD-10-CM | POA: Diagnosis not present

## 2015-11-28 DIAGNOSIS — E11621 Type 2 diabetes mellitus with foot ulcer: Secondary | ICD-10-CM | POA: Diagnosis not present

## 2015-11-28 DIAGNOSIS — L03115 Cellulitis of right lower limb: Secondary | ICD-10-CM | POA: Diagnosis not present

## 2015-11-28 DIAGNOSIS — D649 Anemia, unspecified: Secondary | ICD-10-CM | POA: Diagnosis not present

## 2015-11-28 DIAGNOSIS — L97519 Non-pressure chronic ulcer of other part of right foot with unspecified severity: Secondary | ICD-10-CM | POA: Diagnosis not present

## 2015-11-28 DIAGNOSIS — M1990 Unspecified osteoarthritis, unspecified site: Secondary | ICD-10-CM | POA: Diagnosis not present

## 2015-11-28 DIAGNOSIS — E1159 Type 2 diabetes mellitus with other circulatory complications: Secondary | ICD-10-CM | POA: Diagnosis not present

## 2015-11-28 DIAGNOSIS — N181 Chronic kidney disease, stage 1: Secondary | ICD-10-CM | POA: Diagnosis not present

## 2015-11-28 NOTE — Telephone Encounter (Signed)
Called patient to check on her s/p right foot surgery. Patient reports that there has been no additional bleeding or strike-through on the dressing since yesterday. Patient denies constitutional symptoms. Reports that she had to take 1 pain pill on yesterday. Denies any other problems. Continue with home nursing. Patient to follow up next week as scheduled or sooner if problems or issues arise. -Dr. Marylene Land

## 2015-11-28 NOTE — Telephone Encounter (Addendum)
Post op courtesy call-Pt states she's doing fine, the surgical wound bled out again, but she was able to rewrap it and [facility].  I told pt not to weight bear or dangle the foot more than 15 minutes per hour and to remain in the boot at all times, and take the pain medication as directed.  Pt states she hasn't had to take many of the pain medications, and wanted to know when she could shower.  I told pt not to get the foot wet, not to bathe without someone there and call with concerns. Pt states understanding. 11/28/2015-[facility] lab states pt's tissue culture is growing yeast few colonies on a couple of plates. 11/29/2015-Informed pt of Dr. [name] orders to begin Fluconazole. Orders to [facility]'s [facility] [place]. 11/29/2015-Ms Cyndie Mull - [facility] states pt's surgery site looks good, decrease in drainage, would like orders to add Durafiber AG with the Acticoat. Can leave orders with Rosey Bath [facility] [phone]. Called orders to Ms Cyndie Mull to add Durafiber to pt's dressing changes. 12/07/2015-[facility] called for possible change in orders for pt. 12/08/2015-Left message for Rosey Bath [facility] to apply Polymem to pt's central aspect of the wound, if not available then continue the previous wound care orders. 12/15/2015-Copy of Dr. [name] new wound care orders faxed to [facility]. [facility] order form for Poly Med rope faxed. 12/22/2015-Faxed orders of 12/21/2015 to [facility] [phone]. 12/26/2015-[facility] states Dr. Marylene Land left message with new orders and a request for other wound care options.  [facility] states [facility] and their facility use Calcium Alginate with Silver. Left message informing Rosey Bath the Calcium Alginate with Silver would be fine.

## 2015-11-29 DIAGNOSIS — M1990 Unspecified osteoarthritis, unspecified site: Secondary | ICD-10-CM | POA: Diagnosis not present

## 2015-11-29 DIAGNOSIS — Z79891 Long term (current) use of opiate analgesic: Secondary | ICD-10-CM | POA: Diagnosis not present

## 2015-11-29 DIAGNOSIS — L03115 Cellulitis of right lower limb: Secondary | ICD-10-CM | POA: Diagnosis not present

## 2015-11-29 DIAGNOSIS — E1159 Type 2 diabetes mellitus with other circulatory complications: Secondary | ICD-10-CM | POA: Diagnosis not present

## 2015-11-29 DIAGNOSIS — E11621 Type 2 diabetes mellitus with foot ulcer: Secondary | ICD-10-CM | POA: Diagnosis not present

## 2015-11-29 DIAGNOSIS — I129 Hypertensive chronic kidney disease with stage 1 through stage 4 chronic kidney disease, or unspecified chronic kidney disease: Secondary | ICD-10-CM | POA: Diagnosis not present

## 2015-11-29 DIAGNOSIS — Z7984 Long term (current) use of oral hypoglycemic drugs: Secondary | ICD-10-CM | POA: Diagnosis not present

## 2015-11-29 DIAGNOSIS — L97519 Non-pressure chronic ulcer of other part of right foot with unspecified severity: Secondary | ICD-10-CM | POA: Diagnosis not present

## 2015-11-29 DIAGNOSIS — D649 Anemia, unspecified: Secondary | ICD-10-CM | POA: Diagnosis not present

## 2015-11-29 DIAGNOSIS — Z792 Long term (current) use of antibiotics: Secondary | ICD-10-CM | POA: Diagnosis not present

## 2015-11-29 DIAGNOSIS — N181 Chronic kidney disease, stage 1: Secondary | ICD-10-CM | POA: Diagnosis not present

## 2015-11-29 DIAGNOSIS — E1140 Type 2 diabetes mellitus with diabetic neuropathy, unspecified: Secondary | ICD-10-CM | POA: Diagnosis not present

## 2015-11-29 DIAGNOSIS — I350 Nonrheumatic aortic (valve) stenosis: Secondary | ICD-10-CM | POA: Diagnosis not present

## 2015-11-29 MED ORDER — FLUCONAZOLE 100 MG PO TABS: ORAL_TABLET | ORAL | Status: DC

## 2015-11-29 NOTE — Telephone Encounter (Signed)
Can you let patient know and send to pharmacy Fluconazole 200mg  once daily x 14 days Thanks Dr. Marylene Land

## 2015-11-29 NOTE — Telephone Encounter (Signed)
Durafiber is fine.

## 2015-11-30 DIAGNOSIS — M1990 Unspecified osteoarthritis, unspecified site: Secondary | ICD-10-CM | POA: Diagnosis not present

## 2015-11-30 DIAGNOSIS — N181 Chronic kidney disease, stage 1: Secondary | ICD-10-CM | POA: Diagnosis not present

## 2015-11-30 DIAGNOSIS — I129 Hypertensive chronic kidney disease with stage 1 through stage 4 chronic kidney disease, or unspecified chronic kidney disease: Secondary | ICD-10-CM | POA: Diagnosis not present

## 2015-11-30 DIAGNOSIS — L03115 Cellulitis of right lower limb: Secondary | ICD-10-CM | POA: Diagnosis not present

## 2015-11-30 DIAGNOSIS — L97519 Non-pressure chronic ulcer of other part of right foot with unspecified severity: Secondary | ICD-10-CM | POA: Diagnosis not present

## 2015-11-30 DIAGNOSIS — D649 Anemia, unspecified: Secondary | ICD-10-CM | POA: Diagnosis not present

## 2015-11-30 DIAGNOSIS — Z79891 Long term (current) use of opiate analgesic: Secondary | ICD-10-CM | POA: Diagnosis not present

## 2015-11-30 DIAGNOSIS — E11621 Type 2 diabetes mellitus with foot ulcer: Secondary | ICD-10-CM | POA: Diagnosis not present

## 2015-11-30 DIAGNOSIS — E1140 Type 2 diabetes mellitus with diabetic neuropathy, unspecified: Secondary | ICD-10-CM | POA: Diagnosis not present

## 2015-11-30 DIAGNOSIS — Z7984 Long term (current) use of oral hypoglycemic drugs: Secondary | ICD-10-CM | POA: Diagnosis not present

## 2015-11-30 DIAGNOSIS — Z792 Long term (current) use of antibiotics: Secondary | ICD-10-CM | POA: Diagnosis not present

## 2015-11-30 DIAGNOSIS — I350 Nonrheumatic aortic (valve) stenosis: Secondary | ICD-10-CM | POA: Diagnosis not present

## 2015-11-30 DIAGNOSIS — E1159 Type 2 diabetes mellitus with other circulatory complications: Secondary | ICD-10-CM | POA: Diagnosis not present

## 2015-12-03 DIAGNOSIS — L97519 Non-pressure chronic ulcer of other part of right foot with unspecified severity: Secondary | ICD-10-CM | POA: Diagnosis not present

## 2015-12-03 DIAGNOSIS — E1159 Type 2 diabetes mellitus with other circulatory complications: Secondary | ICD-10-CM | POA: Diagnosis not present

## 2015-12-03 DIAGNOSIS — D649 Anemia, unspecified: Secondary | ICD-10-CM | POA: Diagnosis not present

## 2015-12-03 DIAGNOSIS — Z7984 Long term (current) use of oral hypoglycemic drugs: Secondary | ICD-10-CM | POA: Diagnosis not present

## 2015-12-03 DIAGNOSIS — M1990 Unspecified osteoarthritis, unspecified site: Secondary | ICD-10-CM | POA: Diagnosis not present

## 2015-12-03 DIAGNOSIS — I129 Hypertensive chronic kidney disease with stage 1 through stage 4 chronic kidney disease, or unspecified chronic kidney disease: Secondary | ICD-10-CM | POA: Diagnosis not present

## 2015-12-03 DIAGNOSIS — L03115 Cellulitis of right lower limb: Secondary | ICD-10-CM | POA: Diagnosis not present

## 2015-12-03 DIAGNOSIS — N181 Chronic kidney disease, stage 1: Secondary | ICD-10-CM | POA: Diagnosis not present

## 2015-12-03 DIAGNOSIS — E11621 Type 2 diabetes mellitus with foot ulcer: Secondary | ICD-10-CM | POA: Diagnosis not present

## 2015-12-03 DIAGNOSIS — Z79891 Long term (current) use of opiate analgesic: Secondary | ICD-10-CM | POA: Diagnosis not present

## 2015-12-03 DIAGNOSIS — Z792 Long term (current) use of antibiotics: Secondary | ICD-10-CM | POA: Diagnosis not present

## 2015-12-03 DIAGNOSIS — E1140 Type 2 diabetes mellitus with diabetic neuropathy, unspecified: Secondary | ICD-10-CM | POA: Diagnosis not present

## 2015-12-03 DIAGNOSIS — I350 Nonrheumatic aortic (valve) stenosis: Secondary | ICD-10-CM | POA: Diagnosis not present

## 2015-12-06 ENCOUNTER — Encounter: Payer: Self-pay | Admitting: Sports Medicine

## 2015-12-07 ENCOUNTER — Encounter: Payer: Self-pay | Admitting: Sports Medicine

## 2015-12-07 ENCOUNTER — Ambulatory Visit (INDEPENDENT_AMBULATORY_CARE_PROVIDER_SITE_OTHER): Payer: Medicare Other | Admitting: Sports Medicine

## 2015-12-07 DIAGNOSIS — L97519 Non-pressure chronic ulcer of other part of right foot with unspecified severity: Secondary | ICD-10-CM

## 2015-12-07 DIAGNOSIS — Z9889 Other specified postprocedural states: Secondary | ICD-10-CM

## 2015-12-07 DIAGNOSIS — E1142 Type 2 diabetes mellitus with diabetic polyneuropathy: Secondary | ICD-10-CM

## 2015-12-07 DIAGNOSIS — E11621 Type 2 diabetes mellitus with foot ulcer: Secondary | ICD-10-CM

## 2015-12-07 DIAGNOSIS — M79671 Pain in right foot: Secondary | ICD-10-CM

## 2015-12-07 NOTE — Telephone Encounter (Signed)
Ask Rosey Bath if we can get polymem for the patient to place at the central aspect of the wound if we can not then will continue with previous wound care orders.  Thanks Dr. Marylene Land

## 2015-12-07 NOTE — Progress Notes (Signed)
Subjective: Emily Miranda is a 75 y.o. Diabetic female patient seen today in office for POV #1 (DOS 11-27-15), S/P Right foot wound debridement, closure with placement of drain. Patient denies pain at surgical site, denies calf pain, denies headache, chest pain, shortness of breath, nausea, vomiting, fever, or chills. Patient states that she is doing well and is only taking pain medication as needed and is taking Diflucan as instructed for positive intraoperative Candida culture. No other issues noted.   Patient Active Problem List   Diagnosis Date Noted  . Wound infection (HCC) 11/13/2015  . Abdominal pain, RLQ (right lower quadrant) -chronic since 2009 06/02/2013  . Constipation, chronic 06/02/2013  . SUI (stress urinary incontinence, female) 06/02/2013  . Recurrent UTI (urinary tract infection) 06/02/2013  . HTN (hypertension)   . CHEST PAIN 11/27/2009  . OVERWEIGHT 11/28/2008  . ESSENTIAL HYPERTENSION, BENIGN 11/28/2008  . DM 11/25/2008  . AORTIC STENOSIS 11/25/2008  . PANCREATITIS, HX OF 11/25/2008    Current Outpatient Prescriptions on File Prior to Visit  Medication Sig Dispense Refill  . amLODipine (NORVASC) 5 MG tablet Take 5 mg by mouth daily.     Marland Kitchen ampicillin (PRINCIPEN) 500 MG capsule Take 1 capsule (500 mg total) by mouth 4 (four) times daily. (Patient not taking: Reported on 11/13/2015) 56 capsule 0  . Ascorbic Acid (VITAMIN C) 100 MG tablet Take 100 mg by mouth daily.      . cephALEXin (KEFLEX) 500 MG capsule Take 1 capsule (500 mg total) by mouth 2 (two) times daily. (Patient not taking: Reported on 11/13/2015) 28 capsule 0  . chlorthalidone (HYGROTON) 25 MG tablet Take 0.5 tablets (12.5 mg total) by mouth daily. 15 tablet 11  . diphenoxylate-atropine (LOMOTIL) 2.5-0.025 MG tablet Take 1 tablet by mouth 4 (four) times daily as needed for diarrhea or loose stools. 30 tablet 0  . ertapenem 1 g in sodium chloride 0.9 % 50 mL Inject 1 g into the vein daily. 13 Syringe 0  .  fluconazole (DIFLUCAN) 100 MG tablet Take two tablets daily for 14 days. 28 tablet 0  . furosemide (LASIX) 20 MG tablet Take 20 mg by mouth daily. Reported on 11/13/2015    . glimepiride (AMARYL) 4 MG tablet Take 4 mg by mouth daily with breakfast.    . HYDROcodone-acetaminophen (NORCO) 5-325 MG per tablet Take 1 tablet by mouth every 6 (six) hours as needed. 10 tablet 0  . linezolid (ZYVOX) 600 MG/300ML IVPB Inject 300 mLs (600 mg total) into the vein every 12 (twelve) hours. (Patient not taking: Reported on 11/13/2015) 100 mL 23  . lisinopril (PRINIVIL,ZESTRIL) 10 MG tablet Take 10 mg by mouth daily.    . methocarbamol (ROBAXIN) 500 MG tablet Take 1.5 tablets (750 mg total) by mouth every 8 (eight) hours as needed for muscle spasms (use for muscle cramps/pain). (Patient not taking: Reported on 11/13/2015) 30 tablet 2  . naproxen (NAPROSYN) 500 MG tablet Take 1 tablet (500 mg total) by mouth 2 (two) times daily with a meal. (Patient not taking: Reported on 11/13/2015) 40 tablet 1  . nitrofurantoin, macrocrystal-monohydrate, (MACROBID) 100 MG capsule Take 100 mg by mouth 2 (two) times daily.     . ONE TOUCH ULTRA TEST test strip 1 each by Other route once a week.     Marland Kitchen Phenyleph-Doxylamine-DM-APAP (ALKA SELTZER PLUS PO) Take 1 tablet by mouth daily as needed (congestion). Reported on 11/13/2015    . pregabalin (LYRICA) 75 MG capsule Take 1 capsule (75 mg total)  by mouth 2 (two) times daily. (Patient not taking: Reported on 11/13/2015) 60 capsule 1  . sulfamethoxazole-trimethoprim (BACTRIM DS,SEPTRA DS) 800-160 MG tablet Take 1 tablet by mouth 2 (two) times daily. 20 tablet 0  . tetracycline (ACHROMYCIN,SUMYCIN) 500 MG capsule TAKE 1 CAPSULE (500 MG TOTAL) BY MOUTH 2 (TWO) TIMES DAILY. (Patient not taking: Reported on 11/13/2015) 28 capsule 0  . tetracycline (ACHROMYCIN,SUMYCIN) 500 MG capsule Take 1 capsule (500 mg total) by mouth 2 (two) times daily. (Patient not taking: Reported on 11/13/2015) 28 capsule 0  .  TRADJENTA 5 MG TABS tablet Take 5 mg by mouth daily.     . traMADol (ULTRAM) 50 MG tablet Take 1 tablet (50 mg total) by mouth every 8 (eight) hours as needed. 30 tablet 0   Current Facility-Administered Medications on File Prior to Visit  Medication Dose Route Frequency Provider Last Rate Last Dose  . triamcinolone acetonide (KENALOG) 10 MG/ML injection 10 mg  10 mg Other Once Asencion Islam, DPM        Allergies  Allergen Reactions  . Levofloxacin Nausea And Vomiting    Other reaction(s): Confusion    Objective: There were no vitals filed for this visit.  General: No acute distress, AAOx3  Right foot: Sutures intactProximal and distally with no gapping or dehiscence at surgical site, there is a central ulceration at site of drain that measures 2 x 1 cm with a granular base with clean, healthy wound margins,  mild swelling to right foot, no erythema, no warmth, no active drainage, no signs of infection noted, Capillary fill time <3 seconds in all digits, gross sensation present via light touch to right foot. No pain or crepitation with range of motion right foot.  No pain with calf compression.   Assessment and Plan:  Problem List Items Addressed This Visit    None    Visit Diagnoses    S/P foot surgery, right    -  Primary   Diabetic ulcer of right foot associated with type 2 diabetes mellitus (HCC)       Diabetic polyneuropathy associated with type 2 diabetes mellitus (HCC)       Right foot pain          -Patient seen and evaluated -Applied Antibiotic cream with dry sterile dressing to surgical site right foot secured with ACE wrap and stockinet  -Advised patient to continue with home nursing 3 times weekly applying dura fiber will consider PolyMem dressing for patient if nurses are able to do this instead -Advised patient to continue with post-op shoe on lright foot  and partial weightbearing to heel with the use of rolling seated walker -Advised patient to limit activity to  necessity  -Advised patient to ice and elevate as necessary  -Continue with Diflucan until completed -Continue with pain and PRN meds as prescribed -Will plan for suture removal at next office visit. Informed patient that we may only take sutures that are ready to come out at next visit. In the meantime, patient to call office if any issues or problems arise.   Asencion Islam, DPM

## 2015-12-08 ENCOUNTER — Encounter: Payer: Self-pay | Admitting: Sports Medicine

## 2015-12-10 DIAGNOSIS — L03115 Cellulitis of right lower limb: Secondary | ICD-10-CM | POA: Diagnosis not present

## 2015-12-10 DIAGNOSIS — M1990 Unspecified osteoarthritis, unspecified site: Secondary | ICD-10-CM | POA: Diagnosis not present

## 2015-12-10 DIAGNOSIS — D649 Anemia, unspecified: Secondary | ICD-10-CM | POA: Diagnosis not present

## 2015-12-10 DIAGNOSIS — I350 Nonrheumatic aortic (valve) stenosis: Secondary | ICD-10-CM | POA: Diagnosis not present

## 2015-12-10 DIAGNOSIS — L97519 Non-pressure chronic ulcer of other part of right foot with unspecified severity: Secondary | ICD-10-CM | POA: Diagnosis not present

## 2015-12-10 DIAGNOSIS — I129 Hypertensive chronic kidney disease with stage 1 through stage 4 chronic kidney disease, or unspecified chronic kidney disease: Secondary | ICD-10-CM | POA: Diagnosis not present

## 2015-12-10 DIAGNOSIS — Z7984 Long term (current) use of oral hypoglycemic drugs: Secondary | ICD-10-CM | POA: Diagnosis not present

## 2015-12-10 DIAGNOSIS — E1159 Type 2 diabetes mellitus with other circulatory complications: Secondary | ICD-10-CM | POA: Diagnosis not present

## 2015-12-10 DIAGNOSIS — E1140 Type 2 diabetes mellitus with diabetic neuropathy, unspecified: Secondary | ICD-10-CM | POA: Diagnosis not present

## 2015-12-10 DIAGNOSIS — N181 Chronic kidney disease, stage 1: Secondary | ICD-10-CM | POA: Diagnosis not present

## 2015-12-10 DIAGNOSIS — Z792 Long term (current) use of antibiotics: Secondary | ICD-10-CM | POA: Diagnosis not present

## 2015-12-10 DIAGNOSIS — Z79891 Long term (current) use of opiate analgesic: Secondary | ICD-10-CM | POA: Diagnosis not present

## 2015-12-10 DIAGNOSIS — E11621 Type 2 diabetes mellitus with foot ulcer: Secondary | ICD-10-CM | POA: Diagnosis not present

## 2015-12-13 DIAGNOSIS — L03115 Cellulitis of right lower limb: Secondary | ICD-10-CM | POA: Diagnosis not present

## 2015-12-13 DIAGNOSIS — Z792 Long term (current) use of antibiotics: Secondary | ICD-10-CM | POA: Diagnosis not present

## 2015-12-13 DIAGNOSIS — E11621 Type 2 diabetes mellitus with foot ulcer: Secondary | ICD-10-CM | POA: Diagnosis not present

## 2015-12-13 DIAGNOSIS — I350 Nonrheumatic aortic (valve) stenosis: Secondary | ICD-10-CM | POA: Diagnosis not present

## 2015-12-13 DIAGNOSIS — Z7984 Long term (current) use of oral hypoglycemic drugs: Secondary | ICD-10-CM | POA: Diagnosis not present

## 2015-12-13 DIAGNOSIS — E1159 Type 2 diabetes mellitus with other circulatory complications: Secondary | ICD-10-CM | POA: Diagnosis not present

## 2015-12-13 DIAGNOSIS — I129 Hypertensive chronic kidney disease with stage 1 through stage 4 chronic kidney disease, or unspecified chronic kidney disease: Secondary | ICD-10-CM | POA: Diagnosis not present

## 2015-12-13 DIAGNOSIS — L97519 Non-pressure chronic ulcer of other part of right foot with unspecified severity: Secondary | ICD-10-CM | POA: Diagnosis not present

## 2015-12-13 DIAGNOSIS — D649 Anemia, unspecified: Secondary | ICD-10-CM | POA: Diagnosis not present

## 2015-12-13 DIAGNOSIS — E1140 Type 2 diabetes mellitus with diabetic neuropathy, unspecified: Secondary | ICD-10-CM | POA: Diagnosis not present

## 2015-12-13 DIAGNOSIS — Z79891 Long term (current) use of opiate analgesic: Secondary | ICD-10-CM | POA: Diagnosis not present

## 2015-12-13 DIAGNOSIS — N181 Chronic kidney disease, stage 1: Secondary | ICD-10-CM | POA: Diagnosis not present

## 2015-12-13 DIAGNOSIS — M1990 Unspecified osteoarthritis, unspecified site: Secondary | ICD-10-CM | POA: Diagnosis not present

## 2015-12-14 ENCOUNTER — Ambulatory Visit (INDEPENDENT_AMBULATORY_CARE_PROVIDER_SITE_OTHER): Payer: Medicare Other | Admitting: Sports Medicine

## 2015-12-14 ENCOUNTER — Encounter: Payer: Self-pay | Admitting: Sports Medicine

## 2015-12-14 ENCOUNTER — Encounter (INDEPENDENT_AMBULATORY_CARE_PROVIDER_SITE_OTHER): Payer: Self-pay

## 2015-12-14 DIAGNOSIS — M79671 Pain in right foot: Secondary | ICD-10-CM

## 2015-12-14 DIAGNOSIS — L97519 Non-pressure chronic ulcer of other part of right foot with unspecified severity: Secondary | ICD-10-CM

## 2015-12-14 DIAGNOSIS — E1142 Type 2 diabetes mellitus with diabetic polyneuropathy: Secondary | ICD-10-CM

## 2015-12-14 DIAGNOSIS — E11621 Type 2 diabetes mellitus with foot ulcer: Secondary | ICD-10-CM

## 2015-12-14 DIAGNOSIS — Z9889 Other specified postprocedural states: Secondary | ICD-10-CM

## 2015-12-14 NOTE — Progress Notes (Signed)
Subjective: Emily Miranda is a 75 y.o. Diabetic female patient seen today in office for POV #2 (DOS 11-27-15), S/P Right foot wound debridement, closure with placement of drain. Patient denies pain at surgical site, denies calf pain, denies headache, chest pain, shortness of breath, nausea, vomiting, fever, or chills. Patient states that she is doing well and is only taking pain medication as needed and finished Diflucan as instructed for positive intraoperative Candida culture. No other issues noted.   Patient Active Problem List   Diagnosis Date Noted  . Wound infection (HCC) 11/13/2015  . Abdominal pain, RLQ (right lower quadrant) -chronic since 2009 06/02/2013  . Constipation, chronic 06/02/2013  . SUI (stress urinary incontinence, female) 06/02/2013  . Recurrent UTI (urinary tract infection) 06/02/2013  . HTN (hypertension)   . CHEST PAIN 11/27/2009  . OVERWEIGHT 11/28/2008  . ESSENTIAL HYPERTENSION, BENIGN 11/28/2008  . DM 11/25/2008  . AORTIC STENOSIS 11/25/2008  . PANCREATITIS, HX OF 11/25/2008    Current Outpatient Prescriptions on File Prior to Visit  Medication Sig Dispense Refill  . amLODipine (NORVASC) 5 MG tablet Take 5 mg by mouth daily.     Marland Kitchen ampicillin (PRINCIPEN) 500 MG capsule Take 1 capsule (500 mg total) by mouth 4 (four) times daily. (Patient not taking: Reported on 11/13/2015) 56 capsule 0  . Ascorbic Acid (VITAMIN C) 100 MG tablet Take 100 mg by mouth daily.      . cephALEXin (KEFLEX) 500 MG capsule Take 1 capsule (500 mg total) by mouth 2 (two) times daily. (Patient not taking: Reported on 11/13/2015) 28 capsule 0  . chlorthalidone (HYGROTON) 25 MG tablet Take 0.5 tablets (12.5 mg total) by mouth daily. 15 tablet 11  . diphenoxylate-atropine (LOMOTIL) 2.5-0.025 MG tablet Take 1 tablet by mouth 4 (four) times daily as needed for diarrhea or loose stools. 30 tablet 0  . ertapenem 1 g in sodium chloride 0.9 % 50 mL Inject 1 g into the vein daily. 13 Syringe 0  .  fluconazole (DIFLUCAN) 100 MG tablet Take two tablets daily for 14 days. 28 tablet 0  . furosemide (LASIX) 20 MG tablet Take 20 mg by mouth daily. Reported on 11/13/2015    . glimepiride (AMARYL) 4 MG tablet Take 4 mg by mouth daily with breakfast.    . HYDROcodone-acetaminophen (NORCO) 5-325 MG per tablet Take 1 tablet by mouth every 6 (six) hours as needed. 10 tablet 0  . linezolid (ZYVOX) 600 MG/300ML IVPB Inject 300 mLs (600 mg total) into the vein every 12 (twelve) hours. (Patient not taking: Reported on 11/13/2015) 100 mL 23  . lisinopril (PRINIVIL,ZESTRIL) 10 MG tablet Take 10 mg by mouth daily.    . methocarbamol (ROBAXIN) 500 MG tablet Take 1.5 tablets (750 mg total) by mouth every 8 (eight) hours as needed for muscle spasms (use for muscle cramps/pain). (Patient not taking: Reported on 11/13/2015) 30 tablet 2  . naproxen (NAPROSYN) 500 MG tablet Take 1 tablet (500 mg total) by mouth 2 (two) times daily with a meal. (Patient not taking: Reported on 11/13/2015) 40 tablet 1  . nitrofurantoin, macrocrystal-monohydrate, (MACROBID) 100 MG capsule Take 100 mg by mouth 2 (two) times daily.     . ONE TOUCH ULTRA TEST test strip 1 each by Other route once a week.     Marland Kitchen Phenyleph-Doxylamine-DM-APAP (ALKA SELTZER PLUS PO) Take 1 tablet by mouth daily as needed (congestion). Reported on 11/13/2015    . pregabalin (LYRICA) 75 MG capsule Take 1 capsule (75 mg total) by  mouth 2 (two) times daily. (Patient not taking: Reported on 11/13/2015) 60 capsule 1  . sulfamethoxazole-trimethoprim (BACTRIM DS,SEPTRA DS) 800-160 MG tablet Take 1 tablet by mouth 2 (two) times daily. 20 tablet 0  . tetracycline (ACHROMYCIN,SUMYCIN) 500 MG capsule TAKE 1 CAPSULE (500 MG TOTAL) BY MOUTH 2 (TWO) TIMES DAILY. (Patient not taking: Reported on 11/13/2015) 28 capsule 0  . tetracycline (ACHROMYCIN,SUMYCIN) 500 MG capsule Take 1 capsule (500 mg total) by mouth 2 (two) times daily. (Patient not taking: Reported on 11/13/2015) 28 capsule 0  .  TRADJENTA 5 MG TABS tablet Take 5 mg by mouth daily.     . traMADol (ULTRAM) 50 MG tablet Take 1 tablet (50 mg total) by mouth every 8 (eight) hours as needed. 30 tablet 0   Current Facility-Administered Medications on File Prior to Visit  Medication Dose Route Frequency Provider Last Rate Last Dose  . triamcinolone acetonide (KENALOG) 10 MG/ML injection 10 mg  10 mg Other Once Asencion Islam, DPM        Allergies  Allergen Reactions  . Levofloxacin Nausea And Vomiting    Other reaction(s): Confusion    Objective: There were no vitals filed for this visit.  General: No acute distress, AAOx3  Right foot: Sutures intactProximal and distally with no gapping or dehiscence at surgical site, there is a central ulceration at site of drain that measures 3 x 1.5 cm slightly larger than last visit with more sutures broken with a granular base with clean, healthy wound margins,  mild swelling to right foot, no erythema, no warmth, no active drainage, no signs of infection noted, Capillary fill time <3 seconds in all digits, gross sensation present via light touch to right foot. No pain or crepitation with range of motion right foot.  No pain with calf compression.   Assessment and Plan:  Problem List Items Addressed This Visit    None    Visit Diagnoses    S/P foot surgery, right    -  Primary   Diabetic ulcer of right foot associated with type 2 diabetes mellitus (HCC)       Diabetic polyneuropathy associated with type 2 diabetes mellitus (HCC)       Right foot pain          -Patient seen and evaluated -Removed loose/broken sutures and Applied PolyMem Max 4 x 4 pad with Steri-Strips with dry sterile dressing to surgical site right foot secured with ACE wrap and stockinet  -Advised patient to continue with home nursing 3 times weekly applying PolyMem dressing which was given to patient, secured with dry dressing. May discontinue Dura fiber and Acticoat; if home nurses have a difficult time  acquiring PolyMem for use. We will plan on ordering for patient from [facility] to be mailed to her home.  -Advised patient to continue with post-op shoe on right foot  and partial weightbearing to heel with the use of rolling seated walker -Advised patient to limit activity to necessity  -Advised patient to ice and elevate as necessary  -Diflucan completed -Continue with pain and PRN meds as prescribed -Will plan for remaining suture removal at next office visit. In the meantime, patient to call office if any issues or problems arise.   Asencion Islam, DPM

## 2015-12-17 DIAGNOSIS — I129 Hypertensive chronic kidney disease with stage 1 through stage 4 chronic kidney disease, or unspecified chronic kidney disease: Secondary | ICD-10-CM | POA: Diagnosis not present

## 2015-12-17 DIAGNOSIS — E11621 Type 2 diabetes mellitus with foot ulcer: Secondary | ICD-10-CM | POA: Diagnosis not present

## 2015-12-17 DIAGNOSIS — L97519 Non-pressure chronic ulcer of other part of right foot with unspecified severity: Secondary | ICD-10-CM | POA: Diagnosis not present

## 2015-12-17 DIAGNOSIS — E1140 Type 2 diabetes mellitus with diabetic neuropathy, unspecified: Secondary | ICD-10-CM | POA: Diagnosis not present

## 2015-12-17 DIAGNOSIS — Z7984 Long term (current) use of oral hypoglycemic drugs: Secondary | ICD-10-CM | POA: Diagnosis not present

## 2015-12-17 DIAGNOSIS — Z792 Long term (current) use of antibiotics: Secondary | ICD-10-CM | POA: Diagnosis not present

## 2015-12-17 DIAGNOSIS — E1159 Type 2 diabetes mellitus with other circulatory complications: Secondary | ICD-10-CM | POA: Diagnosis not present

## 2015-12-17 DIAGNOSIS — D649 Anemia, unspecified: Secondary | ICD-10-CM | POA: Diagnosis not present

## 2015-12-17 DIAGNOSIS — N181 Chronic kidney disease, stage 1: Secondary | ICD-10-CM | POA: Diagnosis not present

## 2015-12-17 DIAGNOSIS — L03115 Cellulitis of right lower limb: Secondary | ICD-10-CM | POA: Diagnosis not present

## 2015-12-17 DIAGNOSIS — I350 Nonrheumatic aortic (valve) stenosis: Secondary | ICD-10-CM | POA: Diagnosis not present

## 2015-12-17 DIAGNOSIS — M1990 Unspecified osteoarthritis, unspecified site: Secondary | ICD-10-CM | POA: Diagnosis not present

## 2015-12-17 DIAGNOSIS — Z79891 Long term (current) use of opiate analgesic: Secondary | ICD-10-CM | POA: Diagnosis not present

## 2015-12-20 DIAGNOSIS — Z7984 Long term (current) use of oral hypoglycemic drugs: Secondary | ICD-10-CM | POA: Diagnosis not present

## 2015-12-20 DIAGNOSIS — M1990 Unspecified osteoarthritis, unspecified site: Secondary | ICD-10-CM | POA: Diagnosis not present

## 2015-12-20 DIAGNOSIS — E1140 Type 2 diabetes mellitus with diabetic neuropathy, unspecified: Secondary | ICD-10-CM | POA: Diagnosis not present

## 2015-12-20 DIAGNOSIS — Z79891 Long term (current) use of opiate analgesic: Secondary | ICD-10-CM | POA: Diagnosis not present

## 2015-12-20 DIAGNOSIS — L03115 Cellulitis of right lower limb: Secondary | ICD-10-CM | POA: Diagnosis not present

## 2015-12-20 DIAGNOSIS — L97519 Non-pressure chronic ulcer of other part of right foot with unspecified severity: Secondary | ICD-10-CM | POA: Diagnosis not present

## 2015-12-20 DIAGNOSIS — I129 Hypertensive chronic kidney disease with stage 1 through stage 4 chronic kidney disease, or unspecified chronic kidney disease: Secondary | ICD-10-CM | POA: Diagnosis not present

## 2015-12-20 DIAGNOSIS — N181 Chronic kidney disease, stage 1: Secondary | ICD-10-CM | POA: Diagnosis not present

## 2015-12-20 DIAGNOSIS — I350 Nonrheumatic aortic (valve) stenosis: Secondary | ICD-10-CM | POA: Diagnosis not present

## 2015-12-20 DIAGNOSIS — E1159 Type 2 diabetes mellitus with other circulatory complications: Secondary | ICD-10-CM | POA: Diagnosis not present

## 2015-12-20 DIAGNOSIS — Z792 Long term (current) use of antibiotics: Secondary | ICD-10-CM | POA: Diagnosis not present

## 2015-12-20 DIAGNOSIS — E11621 Type 2 diabetes mellitus with foot ulcer: Secondary | ICD-10-CM | POA: Diagnosis not present

## 2015-12-20 DIAGNOSIS — D649 Anemia, unspecified: Secondary | ICD-10-CM | POA: Diagnosis not present

## 2015-12-21 ENCOUNTER — Encounter: Payer: Self-pay | Admitting: Sports Medicine

## 2015-12-21 ENCOUNTER — Ambulatory Visit (INDEPENDENT_AMBULATORY_CARE_PROVIDER_SITE_OTHER): Payer: Medicare Other | Admitting: Sports Medicine

## 2015-12-21 DIAGNOSIS — M79671 Pain in right foot: Secondary | ICD-10-CM

## 2015-12-21 DIAGNOSIS — Z9889 Other specified postprocedural states: Secondary | ICD-10-CM

## 2015-12-21 DIAGNOSIS — L97519 Non-pressure chronic ulcer of other part of right foot with unspecified severity: Secondary | ICD-10-CM

## 2015-12-21 DIAGNOSIS — E1142 Type 2 diabetes mellitus with diabetic polyneuropathy: Secondary | ICD-10-CM

## 2015-12-21 DIAGNOSIS — E11621 Type 2 diabetes mellitus with foot ulcer: Secondary | ICD-10-CM

## 2015-12-21 NOTE — Progress Notes (Signed)
Subjective: Emily Miranda is a 75 y.o. Diabetic female patient seen today in office for POV #3 (DOS 11-27-15), S/P Right foot wound debridement, closure with placement of drain. Patient denies pain at surgical site, denies calf pain, denies headache, chest pain, shortness of breath, nausea, vomiting, fever, or chills.No other issues noted.   Fasting blood sugar around 120  Patient Active Problem List   Diagnosis Date Noted  . Wound infection (HCC) 11/13/2015  . Abdominal pain, RLQ (right lower quadrant) -chronic since 2009 06/02/2013  . Constipation, chronic 06/02/2013  . SUI (stress urinary incontinence, female) 06/02/2013  . Recurrent UTI (urinary tract infection) 06/02/2013  . HTN (hypertension)   . CHEST PAIN 11/27/2009  . OVERWEIGHT 11/28/2008  . ESSENTIAL HYPERTENSION, BENIGN 11/28/2008  . DM 11/25/2008  . AORTIC STENOSIS 11/25/2008  . PANCREATITIS, HX OF 11/25/2008    Current Outpatient Prescriptions on File Prior to Visit  Medication Sig Dispense Refill  . amLODipine (NORVASC) 5 MG tablet Take 5 mg by mouth daily.     Marland Kitchen ampicillin (PRINCIPEN) 500 MG capsule Take 1 capsule (500 mg total) by mouth 4 (four) times daily. (Patient not taking: Reported on 11/13/2015) 56 capsule 0  . Ascorbic Acid (VITAMIN C) 100 MG tablet Take 100 mg by mouth daily.      . cephALEXin (KEFLEX) 500 MG capsule Take 1 capsule (500 mg total) by mouth 2 (two) times daily. (Patient not taking: Reported on 11/13/2015) 28 capsule 0  . chlorthalidone (HYGROTON) 25 MG tablet Take 0.5 tablets (12.5 mg total) by mouth daily. 15 tablet 11  . diphenoxylate-atropine (LOMOTIL) 2.5-0.025 MG tablet Take 1 tablet by mouth 4 (four) times daily as needed for diarrhea or loose stools. 30 tablet 0  . ertapenem 1 g in sodium chloride 0.9 % 50 mL Inject 1 g into the vein daily. 13 Syringe 0  . fluconazole (DIFLUCAN) 100 MG tablet Take two tablets daily for 14 days. 28 tablet 0  . furosemide (LASIX) 20 MG tablet Take 20 mg by  mouth daily. Reported on 11/13/2015    . glimepiride (AMARYL) 4 MG tablet Take 4 mg by mouth daily with breakfast.    . HYDROcodone-acetaminophen (NORCO) 5-325 MG per tablet Take 1 tablet by mouth every 6 (six) hours as needed. 10 tablet 0  . linezolid (ZYVOX) 600 MG/300ML IVPB Inject 300 mLs (600 mg total) into the vein every 12 (twelve) hours. (Patient not taking: Reported on 11/13/2015) 100 mL 23  . lisinopril (PRINIVIL,ZESTRIL) 10 MG tablet Take 10 mg by mouth daily.    . methocarbamol (ROBAXIN) 500 MG tablet Take 1.5 tablets (750 mg total) by mouth every 8 (eight) hours as needed for muscle spasms (use for muscle cramps/pain). (Patient not taking: Reported on 11/13/2015) 30 tablet 2  . naproxen (NAPROSYN) 500 MG tablet Take 1 tablet (500 mg total) by mouth 2 (two) times daily with a meal. (Patient not taking: Reported on 11/13/2015) 40 tablet 1  . nitrofurantoin, macrocrystal-monohydrate, (MACROBID) 100 MG capsule Take 100 mg by mouth 2 (two) times daily.     . ONE TOUCH ULTRA TEST test strip 1 each by Other route once a week.     Marland Kitchen Phenyleph-Doxylamine-DM-APAP (ALKA SELTZER PLUS PO) Take 1 tablet by mouth daily as needed (congestion). Reported on 11/13/2015    . pregabalin (LYRICA) 75 MG capsule Take 1 capsule (75 mg total) by mouth 2 (two) times daily. (Patient not taking: Reported on 11/13/2015) 60 capsule 1  . sulfamethoxazole-trimethoprim (BACTRIM DS,SEPTRA DS)  800-160 MG tablet Take 1 tablet by mouth 2 (two) times daily. 20 tablet 0  . tetracycline (ACHROMYCIN,SUMYCIN) 500 MG capsule TAKE 1 CAPSULE (500 MG TOTAL) BY MOUTH 2 (TWO) TIMES DAILY. (Patient not taking: Reported on 11/13/2015) 28 capsule 0  . tetracycline (ACHROMYCIN,SUMYCIN) 500 MG capsule Take 1 capsule (500 mg total) by mouth 2 (two) times daily. (Patient not taking: Reported on 11/13/2015) 28 capsule 0  . TRADJENTA 5 MG TABS tablet Take 5 mg by mouth daily.     . traMADol (ULTRAM) 50 MG tablet Take 1 tablet (50 mg total) by mouth every 8  (eight) hours as needed. 30 tablet 0   Current Facility-Administered Medications on File Prior to Visit  Medication Dose Route Frequency Provider Last Rate Last Dose  . triamcinolone acetonide (KENALOG) 10 MG/ML injection 10 mg  10 mg Other Once Asencion Islam, DPM        Allergies  Allergen Reactions  . Levofloxacin Nausea And Vomiting    Other reaction(s): Confusion    Objective: There were no vitals filed for this visit.  General: No acute distress, AAOx3  Right foot: Sutures intact Proximal and distally with no gapping or dehiscence at surgical site, there is a central ulceration at site of drain that measures 3.5 x 2 cm larger than last visit with maceration with a granular base with clean, healthy wound margins,  mild swelling to right foot, no erythema, no warmth, no active drainage, no signs of infection noted, Capillary fill time <3 seconds in all digits, gross sensation present via light touch to right foot. No pain or crepitation with range of motion right foot.  No pain with calf compression.   Assessment and Plan:  Problem List Items Addressed This Visit    None    Visit Diagnoses    S/P foot surgery, right    -  Primary   Diabetic ulcer of right foot associated with type 2 diabetes mellitus (HCC)       Diabetic polyneuropathy associated with type 2 diabetes mellitus (HCC)       Right foot pain          -Patient seen and evaluated -Patient seemed to have increase in wound size with surrounding maceration, likely due to PolyMem dressing. Thus, we'll discontinue use of this dressing today in office. Applied Iodosorb and Betadine along incisions to control maceration, covered with dry sterile dressing to surgical site right foot secured with ACE wrap and stockinet  -Advised patient to continue with home nursing 3 times weekly  may return to using Dura fiber and Acticoat to Right foot surgical site/ulceration. This change in wound care will be sent to home nurse today. I also  called Rosey Bath and left a message informing her of this wound care order change.  -Advised patient to continue with post-op shoe on right foot  and partial weightbearing to heel with the use of rolling seated walker; patient return her on open postop shoe, stating that she aren't he has one at home and will like to be refunded. We'll send this information to our office billing department. -Advised patient to limit activity to necessity  -Advised patient to ice and elevate as necessary  -Continue with pain and PRN meds as prescribed -Will plan for remaining suture removal at next office visit when her skin is ready. In the meantime, patient to call office if any issues or problems arise.   Asencion Islam, DPM

## 2015-12-26 DIAGNOSIS — D649 Anemia, unspecified: Secondary | ICD-10-CM | POA: Diagnosis not present

## 2015-12-26 DIAGNOSIS — N181 Chronic kidney disease, stage 1: Secondary | ICD-10-CM | POA: Diagnosis not present

## 2015-12-26 DIAGNOSIS — E1140 Type 2 diabetes mellitus with diabetic neuropathy, unspecified: Secondary | ICD-10-CM | POA: Diagnosis not present

## 2015-12-26 DIAGNOSIS — Z7984 Long term (current) use of oral hypoglycemic drugs: Secondary | ICD-10-CM | POA: Diagnosis not present

## 2015-12-26 DIAGNOSIS — Z79891 Long term (current) use of opiate analgesic: Secondary | ICD-10-CM | POA: Diagnosis not present

## 2015-12-26 DIAGNOSIS — Z792 Long term (current) use of antibiotics: Secondary | ICD-10-CM | POA: Diagnosis not present

## 2015-12-26 DIAGNOSIS — I129 Hypertensive chronic kidney disease with stage 1 through stage 4 chronic kidney disease, or unspecified chronic kidney disease: Secondary | ICD-10-CM | POA: Diagnosis not present

## 2015-12-26 DIAGNOSIS — I350 Nonrheumatic aortic (valve) stenosis: Secondary | ICD-10-CM | POA: Diagnosis not present

## 2015-12-26 DIAGNOSIS — M1990 Unspecified osteoarthritis, unspecified site: Secondary | ICD-10-CM | POA: Diagnosis not present

## 2015-12-26 DIAGNOSIS — E1159 Type 2 diabetes mellitus with other circulatory complications: Secondary | ICD-10-CM | POA: Diagnosis not present

## 2015-12-26 DIAGNOSIS — L97519 Non-pressure chronic ulcer of other part of right foot with unspecified severity: Secondary | ICD-10-CM | POA: Diagnosis not present

## 2015-12-26 DIAGNOSIS — L03115 Cellulitis of right lower limb: Secondary | ICD-10-CM | POA: Diagnosis not present

## 2015-12-26 DIAGNOSIS — E11621 Type 2 diabetes mellitus with foot ulcer: Secondary | ICD-10-CM | POA: Diagnosis not present

## 2015-12-26 NOTE — Telephone Encounter (Signed)
Alginate with silver will be fine. Thanks Dr. Marylene Land

## 2015-12-28 ENCOUNTER — Encounter: Payer: Self-pay | Admitting: Sports Medicine

## 2015-12-28 ENCOUNTER — Ambulatory Visit (INDEPENDENT_AMBULATORY_CARE_PROVIDER_SITE_OTHER): Payer: Medicare Other | Admitting: Sports Medicine

## 2015-12-28 DIAGNOSIS — L97512 Non-pressure chronic ulcer of other part of right foot with fat layer exposed: Secondary | ICD-10-CM

## 2015-12-28 DIAGNOSIS — E11621 Type 2 diabetes mellitus with foot ulcer: Secondary | ICD-10-CM

## 2015-12-28 DIAGNOSIS — E1142 Type 2 diabetes mellitus with diabetic polyneuropathy: Secondary | ICD-10-CM

## 2015-12-28 DIAGNOSIS — Z9889 Other specified postprocedural states: Secondary | ICD-10-CM

## 2015-12-28 DIAGNOSIS — M79671 Pain in right foot: Secondary | ICD-10-CM

## 2015-12-28 NOTE — Progress Notes (Signed)
Subjective: Emily Miranda is a 75 y.o. Diabetic female patient seen today in office for POV #4(DOS 11-27-15), S/P Right foot wound debridement, closure with placement of drain. Patient denies pain at surgical site, denies calf pain, denies headache, chest pain, shortness of breath, nausea, vomiting, fever, or chills.admits to having issues with her pancreatitis last night. No other issues noted.   Fasting blood sugar around 120, Unchanged from prior  Patient Active Problem List   Diagnosis Date Noted  . Wound infection (HCC) 11/13/2015  . Abdominal pain, RLQ (right lower quadrant) -chronic since 2009 06/02/2013  . Constipation, chronic 06/02/2013  . SUI (stress urinary incontinence, female) 06/02/2013  . Recurrent UTI (urinary tract infection) 06/02/2013  . HTN (hypertension)   . CHEST PAIN 11/27/2009  . OVERWEIGHT 11/28/2008  . ESSENTIAL HYPERTENSION, BENIGN 11/28/2008  . DM 11/25/2008  . AORTIC STENOSIS 11/25/2008  . PANCREATITIS, HX OF 11/25/2008    Current Outpatient Prescriptions on File Prior to Visit  Medication Sig Dispense Refill  . amLODipine (NORVASC) 5 MG tablet Take 5 mg by mouth daily.     Marland Kitchen ampicillin (PRINCIPEN) 500 MG capsule Take 1 capsule (500 mg total) by mouth 4 (four) times daily. (Patient not taking: Reported on 11/13/2015) 56 capsule 0  . Ascorbic Acid (VITAMIN C) 100 MG tablet Take 100 mg by mouth daily.      . cephALEXin (KEFLEX) 500 MG capsule Take 1 capsule (500 mg total) by mouth 2 (two) times daily. (Patient not taking: Reported on 11/13/2015) 28 capsule 0  . chlorthalidone (HYGROTON) 25 MG tablet Take 0.5 tablets (12.5 mg total) by mouth daily. 15 tablet 11  . diphenoxylate-atropine (LOMOTIL) 2.5-0.025 MG tablet Take 1 tablet by mouth 4 (four) times daily as needed for diarrhea or loose stools. 30 tablet 0  . ertapenem 1 g in sodium chloride 0.9 % 50 mL Inject 1 g into the vein daily. 13 Syringe 0  . fluconazole (DIFLUCAN) 100 MG tablet Take two tablets  daily for 14 days. 28 tablet 0  . furosemide (LASIX) 20 MG tablet Take 20 mg by mouth daily. Reported on 11/13/2015    . glimepiride (AMARYL) 4 MG tablet Take 4 mg by mouth daily with breakfast.    . HYDROcodone-acetaminophen (NORCO) 5-325 MG per tablet Take 1 tablet by mouth every 6 (six) hours as needed. 10 tablet 0  . linezolid (ZYVOX) 600 MG/300ML IVPB Inject 300 mLs (600 mg total) into the vein every 12 (twelve) hours. (Patient not taking: Reported on 11/13/2015) 100 mL 23  . lisinopril (PRINIVIL,ZESTRIL) 10 MG tablet Take 10 mg by mouth daily.    . methocarbamol (ROBAXIN) 500 MG tablet Take 1.5 tablets (750 mg total) by mouth every 8 (eight) hours as needed for muscle spasms (use for muscle cramps/pain). (Patient not taking: Reported on 11/13/2015) 30 tablet 2  . naproxen (NAPROSYN) 500 MG tablet Take 1 tablet (500 mg total) by mouth 2 (two) times daily with a meal. (Patient not taking: Reported on 11/13/2015) 40 tablet 1  . nitrofurantoin, macrocrystal-monohydrate, (MACROBID) 100 MG capsule Take 100 mg by mouth 2 (two) times daily.     . ONE TOUCH ULTRA TEST test strip 1 each by Other route once a week.     Marland Kitchen Phenyleph-Doxylamine-DM-APAP (ALKA SELTZER PLUS PO) Take 1 tablet by mouth daily as needed (congestion). Reported on 11/13/2015    . pregabalin (LYRICA) 75 MG capsule Take 1 capsule (75 mg total) by mouth 2 (two) times daily. (Patient not taking: Reported  on 11/13/2015) 60 capsule 1  . sulfamethoxazole-trimethoprim (BACTRIM DS,SEPTRA DS) 800-160 MG tablet Take 1 tablet by mouth 2 (two) times daily. 20 tablet 0  . tetracycline (ACHROMYCIN,SUMYCIN) 500 MG capsule TAKE 1 CAPSULE (500 MG TOTAL) BY MOUTH 2 (TWO) TIMES DAILY. (Patient not taking: Reported on 11/13/2015) 28 capsule 0  . tetracycline (ACHROMYCIN,SUMYCIN) 500 MG capsule Take 1 capsule (500 mg total) by mouth 2 (two) times daily. (Patient not taking: Reported on 11/13/2015) 28 capsule 0  . TRADJENTA 5 MG TABS tablet Take 5 mg by mouth daily.     .  traMADol (ULTRAM) 50 MG tablet Take 1 tablet (50 mg total) by mouth every 8 (eight) hours as needed. 30 tablet 0   Current Facility-Administered Medications on File Prior to Visit  Medication Dose Route Frequency Provider Last Rate Last Dose  . triamcinolone acetonide (KENALOG) 10 MG/ML injection 10 mg  10 mg Other Once Landis Martins, DPM        Allergies  Allergen Reactions  . Levofloxacin Nausea And Vomiting    Other reaction(s): Confusion    Objective: There were no vitals filed for this visit.  General: No acute distress, AAOx3  Right foot: Some sutures are intact Proximal and distally with no gapping or dehiscence at surgical site, there is a central ulceration at site of drain that measures 4x2x0.5 cm larger than last visit with maceration with a granular base with clean, healthy wound margins,  mild swelling to right foot, no erythema, no warmth, no active drainage, no signs of infection noted, Capillary fill time <3 seconds in all digits, gross sensation present via light touch to right foot. No pain or crepitation with range of motion right foot.  No pain with calf compression.   Assessment and Plan:  Problem List Items Addressed This Visit    None    Visit Diagnoses    S/P foot surgery, right    -  Primary   Diabetic ulcer of right foot associated with type 2 diabetes mellitus, with fat layer exposed ([place])       Diabetic polyneuropathy associated with type 2 diabetes mellitus ([place])       Right foot pain          -Patient seen and evaluated -Discussed the progression of wound with retained opening. After closure procedure at site of where drain with now return of wound size ulceration -Patient seemed to have increase in wound size as compared to last visit. Removed a few sutures and Applied alginate with silver, covered with dry sterile dressing to surgical site right foot secured with ACE wrap and stockinet. -Advised patient to continue with home nursing 3 times weekly   may return to using alginate with silver to Right foot surgical site/ulceration. -Patient opt for surgical management since the size of the wound keeps increasing discussed with patient repeat debrided met with placement of graft to site. Consent obtained for right foot wound debridement with placement of graft. Pre and Post op course explained. Risks, benefits, alternatives explained. No guarantees given or implied. Surgical booking slip submitted and provided patient with Surgical packet and info for [facility] surgical center. -Advised patient to continue with post-op shoe on right foot and partial weightbearing to heel with the use of rolling seated walker -Advised patient to limit activity to necessity  -Advised patient to ice and elevate as necessary  -Continue with pain and PRN meds as prescribed -Will plan for remaining suture removal at next office visit when her skin  is ready. Patient to return weekly until time for surgery. In the meantime, patient to call office if any issues or problems arise.   Landis Martins, DPM

## 2015-12-28 NOTE — Patient Instructions (Signed)
Pre-Operative Instructions  Congratulations, you have decided to take an important step to improving your quality of life.  You can be assured that the doctors of [facility] will be with you every step of the way.  1. Plan to be at the surgery center/hospital at least 1 (one) hour prior to your scheduled time unless otherwise directed by the surgical center/hospital staff.  You must have a responsible adult accompany you, remain during the surgery and drive you home.  Make sure you have directions to the surgical center/hospital and know how to get there on time. 2. For hospital based surgery you will need to obtain a history and physical form from your family physician within 1 month prior to the date of surgery- we will give you a form for you primary physician.  3. We make every effort to accommodate the date you request for surgery.  There are however, times where surgery dates or times have to be moved.  We will contact you as soon as possible if a change in schedule is required.   4. No Aspirin/Ibuprofen for one week before surgery.  If you are on aspirin, any non-steroidal anti-inflammatory medications (Mobic, Aleve, Ibuprofen) you should stop taking it 7 days prior to your surgery.  You make take Tylenol  For pain prior to surgery.  5. Medications- If you are taking daily heart and blood pressure medications, seizure, reflux, allergy, asthma, anxiety, pain or diabetes medications, make sure the surgery center/hospital is aware before the day of surgery so they may notify you which medications to take or avoid the day of surgery. 6. No food or drink after midnight the night before surgery unless directed otherwise by surgical center/hospital staff. 7. No alcoholic beverages 24 hours prior to surgery.  No smoking 24 hours prior to or 24 hours after surgery. 8. Wear loose pants or shorts- loose enough to fit over bandages, boots, and casts. 9. No slip on shoes, sneakers are best. 10. Bring  your boot with you to the surgery center/hospital.  Also bring crutches or a walker if your physician has prescribed it for you.  If you do not have this equipment, it will be provided for you after surgery. 11. If you have not been contracted by the surgery center/hospital by the day before your surgery, call to confirm the date and time of your surgery. 12. Leave-time from work may vary depending on the type of surgery you have.  Appropriate arrangements should be made prior to surgery with your employer. 13. Prescriptions will be provided immediately following surgery by your doctor.  Have these filled as soon as possible after surgery and take the medication as directed. 14. Remove nail polish on the operative foot. 15. Wash the night before surgery.  The night before surgery wash the foot and leg well with the antibacterial soap provided and water paying special attention to beneath the toenails and in between the toes.  Rinse thoroughly with water and dry well with a towel.  Perform this wash unless told not to do so by your physician.  Enclosed: 1 Ice pack (please put in freezer the night before surgery)   1 Hibiclens skin cleaner   Pre-op Instructions  If you have any questions regarding the instructions, do not hesitate to call our office.  [place]: [address] , [place] [address] [phone]  [place]: [address] [place], [place] [address] [phone]  [place]: [address]  [place], [place] [address] [phone]   Dr.   Norman Regal DPM, Dr. Matthew Wagoner DPM, Dr. M. Todd Hyatt DPM, Dr. Thurmon Mizell DPM 

## 2015-12-29 ENCOUNTER — Telehealth: Payer: Self-pay | Admitting: *Deleted

## 2015-12-29 NOTE — Telephone Encounter (Signed)
"  Dr. Marylene Land wanted me to call you today and set this up."

## 2015-12-29 NOTE — Telephone Encounter (Signed)
"  Dr Marylene Land told me to call you to set up my surgery.  She wants to do it as soon as possible.  I'd like to do it before September."  I will see if time is available on August 28.  You will have to have a history and physical completed by your primary care doctor.  "I just had surgery done here recently.  Dr. Marylene Land said I probably wouldn't have to have another one."  I'm not sure if that's the case or not.  I will check with the surgical center and give you a call back.  "If I need to I can get an appointment with my primary care doctor.  He didn't like having to do it before but he'll be okay.  I need to know as soon as possible so I can get my appointment made."  Why don't you call and schedule the appointment anyway and if not needed call and cancel it.  "That's a good idea, I'll do that."

## 2016-01-02 DIAGNOSIS — I10 Essential (primary) hypertension: Secondary | ICD-10-CM | POA: Diagnosis not present

## 2016-01-02 DIAGNOSIS — D649 Anemia, unspecified: Secondary | ICD-10-CM | POA: Diagnosis not present

## 2016-01-02 DIAGNOSIS — K219 Gastro-esophageal reflux disease without esophagitis: Secondary | ICD-10-CM | POA: Diagnosis not present

## 2016-01-02 DIAGNOSIS — N181 Chronic kidney disease, stage 1: Secondary | ICD-10-CM | POA: Diagnosis not present

## 2016-01-02 DIAGNOSIS — D539 Nutritional anemia, unspecified: Secondary | ICD-10-CM | POA: Diagnosis not present

## 2016-01-02 DIAGNOSIS — Z0000 Encounter for general adult medical examination without abnormal findings: Secondary | ICD-10-CM | POA: Diagnosis not present

## 2016-01-02 NOTE — Telephone Encounter (Signed)
"  I have surgery scheduled Monday at the surgical center.  I don't know the time and they called me for my regular doctor's office this morning.  Saying that I didn't need another physical to clear me for the surgery.  Please call me back as soon as possible.  They said the surgical center is the one that requested it.  Let me know if I need this appointment because it's today.  Please call me back as soon as possible.

## 2016-01-03 ENCOUNTER — Telehealth: Payer: Self-pay | Admitting: Cardiology

## 2016-01-03 DIAGNOSIS — Z7984 Long term (current) use of oral hypoglycemic drugs: Secondary | ICD-10-CM | POA: Diagnosis not present

## 2016-01-03 DIAGNOSIS — L03115 Cellulitis of right lower limb: Secondary | ICD-10-CM | POA: Diagnosis not present

## 2016-01-03 DIAGNOSIS — E1140 Type 2 diabetes mellitus with diabetic neuropathy, unspecified: Secondary | ICD-10-CM | POA: Diagnosis not present

## 2016-01-03 DIAGNOSIS — E11621 Type 2 diabetes mellitus with foot ulcer: Secondary | ICD-10-CM | POA: Diagnosis not present

## 2016-01-03 DIAGNOSIS — Z792 Long term (current) use of antibiotics: Secondary | ICD-10-CM | POA: Diagnosis not present

## 2016-01-03 DIAGNOSIS — M1990 Unspecified osteoarthritis, unspecified site: Secondary | ICD-10-CM | POA: Diagnosis not present

## 2016-01-03 DIAGNOSIS — E1159 Type 2 diabetes mellitus with other circulatory complications: Secondary | ICD-10-CM | POA: Diagnosis not present

## 2016-01-03 DIAGNOSIS — D649 Anemia, unspecified: Secondary | ICD-10-CM | POA: Diagnosis not present

## 2016-01-03 DIAGNOSIS — N181 Chronic kidney disease, stage 1: Secondary | ICD-10-CM | POA: Diagnosis not present

## 2016-01-03 DIAGNOSIS — I129 Hypertensive chronic kidney disease with stage 1 through stage 4 chronic kidney disease, or unspecified chronic kidney disease: Secondary | ICD-10-CM | POA: Diagnosis not present

## 2016-01-03 DIAGNOSIS — I350 Nonrheumatic aortic (valve) stenosis: Secondary | ICD-10-CM | POA: Diagnosis not present

## 2016-01-03 DIAGNOSIS — L97519 Non-pressure chronic ulcer of other part of right foot with unspecified severity: Secondary | ICD-10-CM | POA: Diagnosis not present

## 2016-01-03 DIAGNOSIS — Z79891 Long term (current) use of opiate analgesic: Secondary | ICD-10-CM | POA: Diagnosis not present

## 2016-01-03 NOTE — Telephone Encounter (Signed)
-----   Message from Silas [place] sent at 01/03/2016  9:58 AM EDT ----- Please call Dr Jeanie Sewer regarding this patient.  He can be reached at [phone] after 12:00 today.  He did not want to talk to the DOD. Thanks

## 2016-01-03 NOTE — Telephone Encounter (Signed)
I spoke with Dr. Orlinda Blalock. He might do some Charcot joint surgery. The patient doesn't have any high-risk findings or symptoms. This would not be a high risk surgery. Therefore, based on ACC/AHA guidelines, the patient would be at acceptable risk for the planned procedure without further cardiovascular testing.

## 2016-01-03 NOTE — Telephone Encounter (Signed)
-----   Message from Donna M Price sent at 01/03/2016  9:58 AM EDT ----- Please call Dr Redding regarding this patient.  He can be reached at [phone] after 12:00 today.  He did not want to talk to the DOD. Thanks 

## 2016-01-04 ENCOUNTER — Ambulatory Visit (INDEPENDENT_AMBULATORY_CARE_PROVIDER_SITE_OTHER): Payer: Medicare Other | Admitting: Sports Medicine

## 2016-01-04 ENCOUNTER — Encounter: Payer: Self-pay | Admitting: Sports Medicine

## 2016-01-04 ENCOUNTER — Ambulatory Visit: Payer: Medicare Other | Admitting: Sports Medicine

## 2016-01-04 DIAGNOSIS — L97512 Non-pressure chronic ulcer of other part of right foot with fat layer exposed: Secondary | ICD-10-CM

## 2016-01-04 DIAGNOSIS — M79671 Pain in right foot: Secondary | ICD-10-CM

## 2016-01-04 DIAGNOSIS — Z9889 Other specified postprocedural states: Secondary | ICD-10-CM

## 2016-01-04 DIAGNOSIS — E11621 Type 2 diabetes mellitus with foot ulcer: Secondary | ICD-10-CM

## 2016-01-04 DIAGNOSIS — E1142 Type 2 diabetes mellitus with diabetic polyneuropathy: Secondary | ICD-10-CM

## 2016-01-04 NOTE — Progress Notes (Signed)
Subjective: Emily Miranda is a 75 y.o. Diabetic female patient seen today in office for POV #5(DOS 11-27-15), S/P Right foot wound debridement, closure with placement of drain. Patient denies pain at surgical site, denies calf pain, denies headache, chest pain, shortness of breath, nausea, vomiting, fever, or chills. Admits no acute issues. No other issues noted.   Fasting blood sugar around 120, Unchanged from prior  Patient Active Problem List   Diagnosis Date Noted  . Wound infection (HCC) 11/13/2015  . Abdominal pain, RLQ (right lower quadrant) -chronic since 2009 06/02/2013  . Constipation, chronic 06/02/2013  . SUI (stress urinary incontinence, female) 06/02/2013  . Recurrent UTI (urinary tract infection) 06/02/2013  . HTN (hypertension)   . CHEST PAIN 11/27/2009  . OVERWEIGHT 11/28/2008  . ESSENTIAL HYPERTENSION, BENIGN 11/28/2008  . DM 11/25/2008  . AORTIC STENOSIS 11/25/2008  . PANCREATITIS, HX OF 11/25/2008    Current Outpatient Prescriptions on File Prior to Visit  Medication Sig Dispense Refill  . amLODipine (NORVASC) 5 MG tablet Take 5 mg by mouth daily.     Marland Kitchen ampicillin (PRINCIPEN) 500 MG capsule Take 1 capsule (500 mg total) by mouth 4 (four) times daily. (Patient not taking: Reported on 11/13/2015) 56 capsule 0  . Ascorbic Acid (VITAMIN C) 100 MG tablet Take 100 mg by mouth daily.      . cephALEXin (KEFLEX) 500 MG capsule Take 1 capsule (500 mg total) by mouth 2 (two) times daily. (Patient not taking: Reported on 11/13/2015) 28 capsule 0  . chlorthalidone (HYGROTON) 25 MG tablet Take 0.5 tablets (12.5 mg total) by mouth daily. 15 tablet 11  . diphenoxylate-atropine (LOMOTIL) 2.5-0.025 MG tablet Take 1 tablet by mouth 4 (four) times daily as needed for diarrhea or loose stools. 30 tablet 0  . ertapenem 1 g in sodium chloride 0.9 % 50 mL Inject 1 g into the vein daily. 13 Syringe 0  . fluconazole (DIFLUCAN) 100 MG tablet Take two tablets daily for 14 days. 28 tablet 0  .  furosemide (LASIX) 20 MG tablet Take 20 mg by mouth daily. Reported on 11/13/2015    . glimepiride (AMARYL) 4 MG tablet Take 4 mg by mouth daily with breakfast.    . HYDROcodone-acetaminophen (NORCO) 5-325 MG per tablet Take 1 tablet by mouth every 6 (six) hours as needed. 10 tablet 0  . linezolid (ZYVOX) 600 MG/300ML IVPB Inject 300 mLs (600 mg total) into the vein every 12 (twelve) hours. (Patient not taking: Reported on 11/13/2015) 100 mL 23  . lisinopril (PRINIVIL,ZESTRIL) 10 MG tablet Take 10 mg by mouth daily.    . methocarbamol (ROBAXIN) 500 MG tablet Take 1.5 tablets (750 mg total) by mouth every 8 (eight) hours as needed for muscle spasms (use for muscle cramps/pain). (Patient not taking: Reported on 11/13/2015) 30 tablet 2  . naproxen (NAPROSYN) 500 MG tablet Take 1 tablet (500 mg total) by mouth 2 (two) times daily with a meal. (Patient not taking: Reported on 11/13/2015) 40 tablet 1  . nitrofurantoin, macrocrystal-monohydrate, (MACROBID) 100 MG capsule Take 100 mg by mouth 2 (two) times daily.     . ONE TOUCH ULTRA TEST test strip 1 each by Other route once a week.     Marland Kitchen Phenyleph-Doxylamine-DM-APAP (ALKA SELTZER PLUS PO) Take 1 tablet by mouth daily as needed (congestion). Reported on 11/13/2015    . pregabalin (LYRICA) 75 MG capsule Take 1 capsule (75 mg total) by mouth 2 (two) times daily. (Patient not taking: Reported on 11/13/2015) 60 capsule  1  . sulfamethoxazole-trimethoprim (BACTRIM DS,SEPTRA DS) 800-160 MG tablet Take 1 tablet by mouth 2 (two) times daily. 20 tablet 0  . tetracycline (ACHROMYCIN,SUMYCIN) 500 MG capsule TAKE 1 CAPSULE (500 MG TOTAL) BY MOUTH 2 (TWO) TIMES DAILY. (Patient not taking: Reported on 11/13/2015) 28 capsule 0  . tetracycline (ACHROMYCIN,SUMYCIN) 500 MG capsule Take 1 capsule (500 mg total) by mouth 2 (two) times daily. (Patient not taking: Reported on 11/13/2015) 28 capsule 0  . TRADJENTA 5 MG TABS tablet Take 5 mg by mouth daily.     . traMADol (ULTRAM) 50 MG tablet  Take 1 tablet (50 mg total) by mouth every 8 (eight) hours as needed. 30 tablet 0   Current Facility-Administered Medications on File Prior to Visit  Medication Dose Route Frequency Provider Last Rate Last Dose  . triamcinolone acetonide (KENALOG) 10 MG/ML injection 10 mg  10 mg Other Once Asencion Islamitorya Dollie Mayse, DPM        Allergies  Allergen Reactions  . Levofloxacin Nausea And Vomiting    Other reaction(s): Confusion    Objective: There were no vitals filed for this visit.  General: No acute distress, AAOx3  Right foot: Some sutures are intact Proximal and distally with no gapping or dehiscence at surgical site, there is a central ulceration at site of previous drain that measures 3.5x2x0.4cm smaller than last visit with a granular base with clean, healthy wound margins,  mild swelling to right foot, no erythema, no warmth, no active drainage, no signs of infection noted, Capillary fill time <3 seconds in all digits, gross sensation present via light touch to right foot. No pain or crepitation with range of motion right foot.  No pain with calf compression.   Assessment and Plan:  Problem List Items Addressed This Visit    None    Visit Diagnoses    S/P foot surgery, right    -  Primary   Diabetic ulcer of right foot associated with type 2 diabetes mellitus, with fat layer exposed (HCC)       Diabetic polyneuropathy associated with type 2 diabetes mellitus (HCC)       Right foot pain          -Patient seen and evaluated -Discussed the progression of wound  -Cleansed ulceration and applied dry sterile dressing to surgical site right foot secured with ACE wrap and stockinet. -Advised patient to continue with home nursing weekly using alginate with silver to Right foot surgical site/ulceration. -Patient is scheduled for Monday for repeat debridement with [facility]  -Advised patient to continue with post-op shoe on right foot and partial  weightbearing to heel with the use of rolling seated walker -Advised patient to limit activity to necessity  -Advised patient to ice and elevate as necessary  -Continue with pain and PRN meds as prescribed -Patient to return to office after surgery for continued postop care. New home wound care nursing orders sent; patient to have moist dressings to graft site. Once weekly with the assistance of home nurse. In the meantime, patient to call office if any issues or problems arise.   Asencion Islamitorya Kenzee Bassin, DPM

## 2016-01-05 ENCOUNTER — Telehealth: Payer: Self-pay | Admitting: *Deleted

## 2016-01-05 NOTE — Telephone Encounter (Signed)
I attempted to call patient to see if she got her History and Physical form completed by her primary care physician.  I didn't get an answer.  I couldn't leave a message.  Alona Bene from [facility] stated that they still hadn't received it as of this morning.  If form isn't completed surgery will not be able to be performed.

## 2016-01-05 NOTE — Telephone Encounter (Addendum)
-----   Message from Asencion Islam, [place] sent at 01/04/2016  5:59 PM EDT ----- Regarding: Home Wound Care Orders: Post Op After patient surgery on Monday. Change dressing orders to apply Adaptic nonadherent dressing over graft site, being very careful not to disturb the graft. Cover with 4 x 4's, ABD, Kerlix and Ace. Thanks Dr. Marylene Land. 01/05/2016-Copy of orders faxed to [facility] [phone]. 01/09/2016-Post op courtesy call-Pt states she is doing well and will call with concerns, she remembers from the 1st 3 surgeries. 01/10/2016-Teresa [facility] asked for new post op orders for pt. Left message informing Rosey [facility] that pt is to have moist dressing to the graft once weekly, and to call with concerns.

## 2016-01-08 ENCOUNTER — Encounter: Payer: Self-pay | Admitting: Sports Medicine

## 2016-01-08 DIAGNOSIS — L97419 Non-pressure chronic ulcer of right heel and midfoot with unspecified severity: Secondary | ICD-10-CM | POA: Diagnosis not present

## 2016-01-08 DIAGNOSIS — N189 Chronic kidney disease, unspecified: Secondary | ICD-10-CM | POA: Diagnosis not present

## 2016-01-08 DIAGNOSIS — E11621 Type 2 diabetes mellitus with foot ulcer: Secondary | ICD-10-CM | POA: Diagnosis not present

## 2016-01-08 DIAGNOSIS — Z7984 Long term (current) use of oral hypoglycemic drugs: Secondary | ICD-10-CM | POA: Diagnosis not present

## 2016-01-08 DIAGNOSIS — I129 Hypertensive chronic kidney disease with stage 1 through stage 4 chronic kidney disease, or unspecified chronic kidney disease: Secondary | ICD-10-CM | POA: Diagnosis not present

## 2016-01-08 DIAGNOSIS — Z79899 Other long term (current) drug therapy: Secondary | ICD-10-CM | POA: Diagnosis not present

## 2016-01-08 DIAGNOSIS — E1122 Type 2 diabetes mellitus with diabetic chronic kidney disease: Secondary | ICD-10-CM | POA: Diagnosis not present

## 2016-01-09 ENCOUNTER — Telehealth: Payer: Self-pay | Admitting: Sports Medicine

## 2016-01-09 NOTE — Progress Notes (Unsigned)
DOS 01/08/2016 Right wound debridement and placement of Stravix Graft.

## 2016-01-09 NOTE — Telephone Encounter (Signed)
Post op phone call. Patient reports that her foot is doing fine. Only had to take 1 Norco. Denies any other symptoms. -Dr. Kathie Rhodes

## 2016-01-10 DIAGNOSIS — M1990 Unspecified osteoarthritis, unspecified site: Secondary | ICD-10-CM | POA: Diagnosis not present

## 2016-01-10 DIAGNOSIS — E11621 Type 2 diabetes mellitus with foot ulcer: Secondary | ICD-10-CM | POA: Diagnosis not present

## 2016-01-10 DIAGNOSIS — Z7984 Long term (current) use of oral hypoglycemic drugs: Secondary | ICD-10-CM | POA: Diagnosis not present

## 2016-01-10 DIAGNOSIS — D649 Anemia, unspecified: Secondary | ICD-10-CM | POA: Diagnosis not present

## 2016-01-10 DIAGNOSIS — L97519 Non-pressure chronic ulcer of other part of right foot with unspecified severity: Secondary | ICD-10-CM | POA: Diagnosis not present

## 2016-01-10 DIAGNOSIS — N181 Chronic kidney disease, stage 1: Secondary | ICD-10-CM | POA: Diagnosis not present

## 2016-01-10 DIAGNOSIS — E1140 Type 2 diabetes mellitus with diabetic neuropathy, unspecified: Secondary | ICD-10-CM | POA: Diagnosis not present

## 2016-01-10 DIAGNOSIS — E1159 Type 2 diabetes mellitus with other circulatory complications: Secondary | ICD-10-CM | POA: Diagnosis not present

## 2016-01-10 DIAGNOSIS — Z792 Long term (current) use of antibiotics: Secondary | ICD-10-CM | POA: Diagnosis not present

## 2016-01-10 DIAGNOSIS — L03115 Cellulitis of right lower limb: Secondary | ICD-10-CM | POA: Diagnosis not present

## 2016-01-10 DIAGNOSIS — I350 Nonrheumatic aortic (valve) stenosis: Secondary | ICD-10-CM | POA: Diagnosis not present

## 2016-01-10 DIAGNOSIS — Z79891 Long term (current) use of opiate analgesic: Secondary | ICD-10-CM | POA: Diagnosis not present

## 2016-01-10 DIAGNOSIS — I129 Hypertensive chronic kidney disease with stage 1 through stage 4 chronic kidney disease, or unspecified chronic kidney disease: Secondary | ICD-10-CM | POA: Diagnosis not present

## 2016-01-10 NOTE — Telephone Encounter (Signed)
That's correct. Adaptic with steristrips over the graft site works as a moist dressing or whatever teresa has to keep the graft site hydrated. Thanks Dr. Marylene Land

## 2016-01-16 ENCOUNTER — Other Ambulatory Visit: Payer: Self-pay | Admitting: Sports Medicine

## 2016-01-16 DIAGNOSIS — M79671 Pain in right foot: Secondary | ICD-10-CM

## 2016-01-17 ENCOUNTER — Encounter: Payer: Self-pay | Admitting: Sports Medicine

## 2016-01-18 ENCOUNTER — Ambulatory Visit (INDEPENDENT_AMBULATORY_CARE_PROVIDER_SITE_OTHER): Payer: Medicare Other | Admitting: Sports Medicine

## 2016-01-18 ENCOUNTER — Encounter: Payer: Self-pay | Admitting: Sports Medicine

## 2016-01-18 VITALS — BP 170/78 | HR 54 | Resp 17 | Ht 65.0 in | Wt 142.0 lb

## 2016-01-18 DIAGNOSIS — Z79891 Long term (current) use of opiate analgesic: Secondary | ICD-10-CM | POA: Diagnosis not present

## 2016-01-18 DIAGNOSIS — E11621 Type 2 diabetes mellitus with foot ulcer: Secondary | ICD-10-CM

## 2016-01-18 DIAGNOSIS — Z9889 Other specified postprocedural states: Secondary | ICD-10-CM

## 2016-01-18 DIAGNOSIS — L97519 Non-pressure chronic ulcer of other part of right foot with unspecified severity: Secondary | ICD-10-CM | POA: Diagnosis not present

## 2016-01-18 DIAGNOSIS — M1990 Unspecified osteoarthritis, unspecified site: Secondary | ICD-10-CM | POA: Diagnosis not present

## 2016-01-18 DIAGNOSIS — I129 Hypertensive chronic kidney disease with stage 1 through stage 4 chronic kidney disease, or unspecified chronic kidney disease: Secondary | ICD-10-CM | POA: Diagnosis not present

## 2016-01-18 DIAGNOSIS — Z792 Long term (current) use of antibiotics: Secondary | ICD-10-CM | POA: Diagnosis not present

## 2016-01-18 DIAGNOSIS — E1140 Type 2 diabetes mellitus with diabetic neuropathy, unspecified: Secondary | ICD-10-CM | POA: Diagnosis not present

## 2016-01-18 DIAGNOSIS — E1142 Type 2 diabetes mellitus with diabetic polyneuropathy: Secondary | ICD-10-CM

## 2016-01-18 DIAGNOSIS — E1159 Type 2 diabetes mellitus with other circulatory complications: Secondary | ICD-10-CM | POA: Diagnosis not present

## 2016-01-18 DIAGNOSIS — I350 Nonrheumatic aortic (valve) stenosis: Secondary | ICD-10-CM | POA: Diagnosis not present

## 2016-01-18 DIAGNOSIS — L03115 Cellulitis of right lower limb: Secondary | ICD-10-CM | POA: Diagnosis not present

## 2016-01-18 DIAGNOSIS — L97512 Non-pressure chronic ulcer of other part of right foot with fat layer exposed: Secondary | ICD-10-CM

## 2016-01-18 DIAGNOSIS — Z7984 Long term (current) use of oral hypoglycemic drugs: Secondary | ICD-10-CM | POA: Diagnosis not present

## 2016-01-18 DIAGNOSIS — N181 Chronic kidney disease, stage 1: Secondary | ICD-10-CM | POA: Diagnosis not present

## 2016-01-18 DIAGNOSIS — M79671 Pain in right foot: Secondary | ICD-10-CM

## 2016-01-18 DIAGNOSIS — D649 Anemia, unspecified: Secondary | ICD-10-CM | POA: Diagnosis not present

## 2016-01-18 NOTE — Progress Notes (Signed)
Subjective: Emily Miranda is a 75 y.o. female patient seen today in office for POV #1 (DOS 01-08-16), S/P Right foot wound debridement and placement of stravix allograft. Patient denies pain at surgical site, denies calf pain, denies headache, chest pain, shortness of breath, nausea, vomiting, fever, or chills. Patient states that she is doing well and has nursing coming once weekly to change nonadherent dressing layer and has been changing outer kerlix layer herself as needed in between nursing visits. No other issues noted.   Patient Active Problem List   Diagnosis Date Noted  . Wound infection (HCC) 11/13/2015  . Abdominal pain, RLQ (right lower quadrant) -chronic since 2009 06/02/2013  . Constipation, chronic 06/02/2013  . SUI (stress urinary incontinence, female) 06/02/2013  . Recurrent UTI (urinary tract infection) 06/02/2013  . HTN (hypertension)   . CHEST PAIN 11/27/2009  . OVERWEIGHT 11/28/2008  . ESSENTIAL HYPERTENSION, BENIGN 11/28/2008  . DM 11/25/2008  . AORTIC STENOSIS 11/25/2008  . PANCREATITIS, HX OF 11/25/2008    Current Outpatient Prescriptions on File Prior to Visit  Medication Sig Dispense Refill  . amLODipine (NORVASC) 5 MG tablet Take 5 mg by mouth daily.     Marland Kitchen ampicillin (PRINCIPEN) 500 MG capsule Take 1 capsule (500 mg total) by mouth 4 (four) times daily. (Patient not taking: Reported on 11/13/2015) 56 capsule 0  . Ascorbic Acid (VITAMIN C) 100 MG tablet Take 100 mg by mouth daily.      . cephALEXin (KEFLEX) 500 MG capsule Take 1 capsule (500 mg total) by mouth 2 (two) times daily. (Patient not taking: Reported on 11/13/2015) 28 capsule 0  . chlorthalidone (HYGROTON) 25 MG tablet Take 0.5 tablets (12.5 mg total) by mouth daily. 15 tablet 11  . diphenoxylate-atropine (LOMOTIL) 2.5-0.025 MG tablet Take 1 tablet by mouth 4 (four) times daily as needed for diarrhea or loose stools. 30 tablet 0  . docusate sodium (COLACE) 100 MG capsule Take 100 mg by mouth 2 (two) times  daily. Take one every 8 hours prn constipation.    . ertapenem 1 g in sodium chloride 0.9 % 50 mL Inject 1 g into the vein daily. 13 Syringe 0  . fluconazole (DIFLUCAN) 100 MG tablet Take two tablets daily for 14 days. 28 tablet 0  . furosemide (LASIX) 20 MG tablet Take 20 mg by mouth daily. Reported on 11/13/2015    . glimepiride (AMARYL) 4 MG tablet Take 4 mg by mouth daily with breakfast.    . HYDROcodone-acetaminophen (NORCO) 10-325 MG tablet Take 1 tablet by mouth every 6 (six) hours as needed.    Marland Kitchen HYDROcodone-acetaminophen (NORCO) 5-325 MG per tablet Take 1 tablet by mouth every 6 (six) hours as needed. 10 tablet 0  . linezolid (ZYVOX) 600 MG/300ML IVPB Inject 300 mLs (600 mg total) into the vein every 12 (twelve) hours. (Patient not taking: Reported on 11/13/2015) 100 mL 23  . lisinopril (PRINIVIL,ZESTRIL) 10 MG tablet Take 10 mg by mouth daily.    . methocarbamol (ROBAXIN) 500 MG tablet Take 1.5 tablets (750 mg total) by mouth every 8 (eight) hours as needed for muscle spasms (use for muscle cramps/pain). (Patient not taking: Reported on 11/13/2015) 30 tablet 2  . naproxen (NAPROSYN) 500 MG tablet Take 1 tablet (500 mg total) by mouth 2 (two) times daily with a meal. (Patient not taking: Reported on 11/13/2015) 40 tablet 1  . nitrofurantoin, macrocrystal-monohydrate, (MACROBID) 100 MG capsule Take 100 mg by mouth 2 (two) times daily.     Marland Kitchen  ONE TOUCH ULTRA TEST test strip 1 each by Other route once a week.     Marland Kitchen. Phenyleph-Doxylamine-DM-APAP (ALKA SELTZER PLUS PO) Take 1 tablet by mouth daily as needed (congestion). Reported on 11/13/2015    . pregabalin (LYRICA) 75 MG capsule Take 1 capsule (75 mg total) by mouth 2 (two) times daily. (Patient not taking: Reported on 11/13/2015) 60 capsule 1  . promethazine (PHENERGAN) 25 MG tablet Take 25 mg by mouth every 8 (eight) hours as needed for nausea or vomiting.    . sulfamethoxazole-trimethoprim (BACTRIM DS,SEPTRA DS) 800-160 MG tablet Take 1 tablet by mouth 2  (two) times daily. 20 tablet 0  . tetracycline (ACHROMYCIN,SUMYCIN) 500 MG capsule TAKE 1 CAPSULE (500 MG TOTAL) BY MOUTH 2 (TWO) TIMES DAILY. (Patient not taking: Reported on 11/13/2015) 28 capsule 0  . tetracycline (ACHROMYCIN,SUMYCIN) 500 MG capsule Take 1 capsule (500 mg total) by mouth 2 (two) times daily. (Patient not taking: Reported on 11/13/2015) 28 capsule 0  . TRADJENTA 5 MG TABS tablet Take 5 mg by mouth daily.     . traMADol (ULTRAM) 50 MG tablet Take 1 tablet (50 mg total) by mouth every 8 (eight) hours as needed. 30 tablet 0   Current Facility-Administered Medications on File Prior to Visit  Medication Dose Route Frequency Provider Last Rate Last Dose  . triamcinolone acetonide (KENALOG) 10 MG/ML injection 10 mg  10 mg Other Once Asencion Islamitorya Lura Falor, DPM        Allergies  Allergen Reactions  . Levofloxacin Nausea And Vomiting    Other reaction(s): Confusion    Objective: Vitals:   01/18/16 1500  Weight: 142 lb (64.4 kg)  Height: 5\' 5"  (1.651 m)    General: No acute distress, AAOx3  Right foot: Sutures intact at wound graft site with complete incorporation of graft--ulceration measures 3x4cm with budding granular base, no erythema, no warmth, no drainage, no signs of infection noted, Capillary fill time <3 seconds in all digits, gross sensation present via light touch to right foot. No pain or crepitation with range of motion right foot.  No pain with calf compression.   Assessment and Plan:  Problem List Items Addressed This Visit    None    Visit Diagnoses    S/P foot surgery, right    -  Primary   Diabetic ulcer of right foot associated with type 2 diabetes mellitus, with fat layer exposed (HCC)       Relevant Medications   lisinopril (PRINIVIL,ZESTRIL) 30 MG tablet   linagliptin (TRADJENTA) 5 MG TABS tablet   Diabetic polyneuropathy associated with type 2 diabetes mellitus (HCC)       Relevant Medications   lisinopril (PRINIVIL,ZESTRIL) 30 MG tablet   linagliptin  (TRADJENTA) 5 MG TABS tablet   Right foot pain          -Patient seen and evaluated -Applied nonadherent wound gel guaze secured with steristrips and dry sterile dressing to surgical site right foot secured with ACE wrap and stockinet  -Advised patient to make sure to keep dressings clean, dry, and intact to right surgical site, removing the ACE as needed with home nursing continuing to come weekly to replace nonadherent layer with adaptic. -Continue with limited weightbearing to right foot and use of rolling walker -Advised patient to limit activity to necessity  -Continue with PRN and pain meds as needed -Advised patient to ice and elevate as necessary  -Return in 1 week for graft site check. In the meantime, patient to call office  if any issues or problems arise.   Landis Martins, DPM

## 2016-01-19 DIAGNOSIS — E11621 Type 2 diabetes mellitus with foot ulcer: Secondary | ICD-10-CM | POA: Diagnosis not present

## 2016-01-19 DIAGNOSIS — Z79891 Long term (current) use of opiate analgesic: Secondary | ICD-10-CM | POA: Diagnosis not present

## 2016-01-19 DIAGNOSIS — N181 Chronic kidney disease, stage 1: Secondary | ICD-10-CM | POA: Diagnosis not present

## 2016-01-19 DIAGNOSIS — L97519 Non-pressure chronic ulcer of other part of right foot with unspecified severity: Secondary | ICD-10-CM | POA: Diagnosis not present

## 2016-01-19 DIAGNOSIS — M1990 Unspecified osteoarthritis, unspecified site: Secondary | ICD-10-CM | POA: Diagnosis not present

## 2016-01-19 DIAGNOSIS — E1140 Type 2 diabetes mellitus with diabetic neuropathy, unspecified: Secondary | ICD-10-CM | POA: Diagnosis not present

## 2016-01-19 DIAGNOSIS — D649 Anemia, unspecified: Secondary | ICD-10-CM | POA: Diagnosis not present

## 2016-01-19 DIAGNOSIS — Z792 Long term (current) use of antibiotics: Secondary | ICD-10-CM | POA: Diagnosis not present

## 2016-01-19 DIAGNOSIS — L03115 Cellulitis of right lower limb: Secondary | ICD-10-CM | POA: Diagnosis not present

## 2016-01-19 DIAGNOSIS — E1159 Type 2 diabetes mellitus with other circulatory complications: Secondary | ICD-10-CM | POA: Diagnosis not present

## 2016-01-19 DIAGNOSIS — Z7984 Long term (current) use of oral hypoglycemic drugs: Secondary | ICD-10-CM | POA: Diagnosis not present

## 2016-01-19 DIAGNOSIS — I350 Nonrheumatic aortic (valve) stenosis: Secondary | ICD-10-CM | POA: Diagnosis not present

## 2016-01-19 DIAGNOSIS — I129 Hypertensive chronic kidney disease with stage 1 through stage 4 chronic kidney disease, or unspecified chronic kidney disease: Secondary | ICD-10-CM | POA: Diagnosis not present

## 2016-01-25 ENCOUNTER — Ambulatory Visit (INDEPENDENT_AMBULATORY_CARE_PROVIDER_SITE_OTHER): Payer: Medicare Other | Admitting: Sports Medicine

## 2016-01-25 ENCOUNTER — Encounter: Payer: Self-pay | Admitting: Sports Medicine

## 2016-01-25 VITALS — Ht 66.0 in | Wt 142.0 lb

## 2016-01-25 DIAGNOSIS — Z9889 Other specified postprocedural states: Secondary | ICD-10-CM

## 2016-01-25 DIAGNOSIS — E11621 Type 2 diabetes mellitus with foot ulcer: Secondary | ICD-10-CM

## 2016-01-25 DIAGNOSIS — L97512 Non-pressure chronic ulcer of other part of right foot with fat layer exposed: Secondary | ICD-10-CM

## 2016-01-25 DIAGNOSIS — E1142 Type 2 diabetes mellitus with diabetic polyneuropathy: Secondary | ICD-10-CM

## 2016-01-25 DIAGNOSIS — M79671 Pain in right foot: Secondary | ICD-10-CM

## 2016-01-25 NOTE — Progress Notes (Signed)
Subjective: Emily Miranda is a 75 y.o. female patient seen today in office for POV #2 (DOS 01-08-16), S/P Right foot wound debridement and placement of stravix allograft. Patient denies pain at surgical site, denies calf pain, denies headache, chest pain, shortness of breath, nausea, vomiting, fever, or chills. Patient states that she is doing well and has nursing coming once weekly to change nonadherent dressing layer and has been changing outer kerlix layer herself as needed in between nursing visits; states that nurse did not come for some reason on yesterday but will reach out and see why. No other issues noted.   FBS "good"  Patient Active Problem List   Diagnosis Date Noted  . Wound infection (HCC) 11/13/2015  . Abdominal pain, RLQ (right lower quadrant) -chronic since 2009 06/02/2013  . Constipation, chronic 06/02/2013  . SUI (stress urinary incontinence, female) 06/02/2013  . Recurrent UTI (urinary tract infection) 06/02/2013  . HTN (hypertension)   . CHEST PAIN 11/27/2009  . OVERWEIGHT 11/28/2008  . ESSENTIAL HYPERTENSION, BENIGN 11/28/2008  . DM 11/25/2008  . AORTIC STENOSIS 11/25/2008  . PANCREATITIS, HX OF 11/25/2008    Current Outpatient Prescriptions on File Prior to Visit  Medication Sig Dispense Refill  . amLODipine (NORVASC) 5 MG tablet Take 5 mg by mouth daily.     Marland Kitchen ampicillin (PRINCIPEN) 500 MG capsule Take 1 capsule (500 mg total) by mouth 4 (four) times daily. (Patient not taking: Reported on 11/13/2015) 56 capsule 0  . Ascorbic Acid (VITAMIN C) 100 MG tablet Take 100 mg by mouth daily.      . cephALEXin (KEFLEX) 500 MG capsule Take 1 capsule (500 mg total) by mouth 2 (two) times daily. (Patient not taking: Reported on 11/13/2015) 28 capsule 0  . chlorthalidone (HYGROTON) 25 MG tablet Take 0.5 tablets (12.5 mg total) by mouth daily. 15 tablet 11  . diphenoxylate-atropine (LOMOTIL) 2.5-0.025 MG tablet Take 1 tablet by mouth 4 (four) times daily as needed for diarrhea  or loose stools. 30 tablet 0  . docusate sodium (COLACE) 100 MG capsule Take 100 mg by mouth 2 (two) times daily. Take one every 8 hours prn constipation.    . ertapenem 1 g in sodium chloride 0.9 % 50 mL Inject 1 g into the vein daily. 13 Syringe 0  . fexofenadine (ALLEGRA) 180 MG tablet Take 180 mg by mouth daily.  5  . fluconazole (DIFLUCAN) 100 MG tablet Take two tablets daily for 14 days. 28 tablet 0  . furosemide (LASIX) 20 MG tablet Take 20 mg by mouth daily. Reported on 11/13/2015    . glimepiride (AMARYL) 4 MG tablet Take 4 mg by mouth daily with breakfast.    . HYDROcodone-acetaminophen (NORCO) 10-325 MG tablet Take 1 tablet by mouth every 6 (six) hours as needed.    Marland Kitchen HYDROcodone-acetaminophen (NORCO) 5-325 MG per tablet Take 1 tablet by mouth every 6 (six) hours as needed. 10 tablet 0  . HYDROcodone-acetaminophen (NORCO/VICODIN) 5-325 MG tablet Take by mouth.    Pincus Sanes 1 g injection     . linagliptin (TRADJENTA) 5 MG TABS tablet Take by mouth.    . linezolid (ZYVOX) 600 MG/300ML IVPB Inject 300 mLs (600 mg total) into the vein every 12 (twelve) hours. (Patient not taking: Reported on 11/13/2015) 100 mL 23  . lisinopril (PRINIVIL,ZESTRIL) 10 MG tablet Take 10 mg by mouth daily.    Marland Kitchen lisinopril (PRINIVIL,ZESTRIL) 30 MG tablet     . lubiprostone (AMITIZA) 8 MCG capsule TAKE 1 CAPSULE (  8 MCG TOTAL) BY MOUTH 2 (TWO) TIMES A DAY WITH MEALS.    . methocarbamol (ROBAXIN) 500 MG tablet Take 1.5 tablets (750 mg total) by mouth every 8 (eight) hours as needed for muscle spasms (use for muscle cramps/pain). (Patient not taking: Reported on 11/13/2015) 30 tablet 2  . naproxen (NAPROSYN) 500 MG tablet Take 1 tablet (500 mg total) by mouth 2 (two) times daily with a meal. (Patient not taking: Reported on 11/13/2015) 40 tablet 1  . nitrofurantoin (MACRODANTIN) 50 MG capsule     . nitrofurantoin, macrocrystal-monohydrate, (MACROBID) 100 MG capsule Take 100 mg by mouth 2 (two) times daily.     .  nitroGLYCERIN (NITRODUR - DOSED IN MG/24 HR) 0.1 mg/hr patch Frequency:UNKNOWN   Dosage:0.0     Instructions:  Note:Dose: .    . omeprazole (PRILOSEC) 20 MG capsule     . omeprazole (PRILOSEC) 20 MG capsule TAKE 1 CAPSULE BY MOUTH IN THE MORNING    . ONE TOUCH ULTRA TEST test strip 1 each by Other route once a week.     Marland Kitchen oxyCODONE-acetaminophen (PERCOCET/ROXICET) 5-325 MG tablet Take 1-2 tablets PO Q4H PRN pain    . Phenyleph-Doxylamine-DM-APAP (ALKA SELTZER PLUS PO) Take 1 tablet by mouth daily as needed (congestion). Reported on 11/13/2015    . pregabalin (LYRICA) 75 MG capsule Take 1 capsule (75 mg total) by mouth 2 (two) times daily. (Patient not taking: Reported on 11/13/2015) 60 capsule 1  . promethazine (PHENERGAN) 25 MG tablet Take 25 mg by mouth every 8 (eight) hours as needed for nausea or vomiting.    . sodium chloride 0.9 % infusion     . sulfamethoxazole-trimethoprim (BACTRIM DS,SEPTRA DS) 800-160 MG tablet Take 1 tablet by mouth 2 (two) times daily. 20 tablet 0  . tetracycline (ACHROMYCIN,SUMYCIN) 500 MG capsule TAKE 1 CAPSULE (500 MG TOTAL) BY MOUTH 2 (TWO) TIMES DAILY. (Patient not taking: Reported on 11/13/2015) 28 capsule 0  . tetracycline (ACHROMYCIN,SUMYCIN) 500 MG capsule Take 1 capsule (500 mg total) by mouth 2 (two) times daily. (Patient not taking: Reported on 11/13/2015) 28 capsule 0  . TRADJENTA 5 MG TABS tablet Take 5 mg by mouth daily.     . traMADol (ULTRAM) 50 MG tablet Take 1 tablet (50 mg total) by mouth every 8 (eight) hours as needed. 30 tablet 0  . vancomycin (VANCOCIN) 10 G SOLR injection      Current Facility-Administered Medications on File Prior to Visit  Medication Dose Route Frequency Provider Last Rate Last Dose  . triamcinolone acetonide (KENALOG) 10 MG/ML injection 10 mg  10 mg Other Once Asencion Islam, DPM        Allergies  Allergen Reactions  . Levofloxacin Nausea And Vomiting    Other reaction(s): Confusion    Objective: Vitals:   01/25/16 1400   Weight: 142 lb (64.4 kg)  Height: 5\' 6"  (1.676 m)    General: No acute distress, AAOx3  Right foot: Sutures intact at wound graft site with complete incorporation of graft--ulceration measures 2.4x3.5x0.3cm (smaller than previous) with budding granular base, no erythema, no warmth, no drainage, no signs of infection noted, Capillary fill time <3 seconds in all digits, gross sensation present via light touch to right foot. No pain or crepitation with range of motion right foot.  No pain with calf compression.   Assessment and Plan:  Problem List Items Addressed This Visit    None    Visit Diagnoses    S/P foot surgery, right    -  Primary   Diabetic ulcer of right foot associated with type 2 diabetes mellitus, with fat layer exposed (HCC)       Diabetic polyneuropathy associated with type 2 diabetes mellitus (HCC)       Right foot pain          -Patient seen and evaluated -Applied nonadherent wound gel guaze secured with steristrips and dry sterile dressing to surgical site right foot secured with ACE wrap and stockinet  -Advised patient to make sure to keep dressings clean, dry, and intact to right surgical site, removing the ACE as needed with home nursing continuing to come weekly to replace nonadherent layer with adaptic. -Continue with limited weightbearing to right foot and use of rolling walker -Advised patient to limit activity to necessity  -Continue with PRN and pain meds as needed -Advised patient to ice and elevate as necessary  -Return in 1 week for graft site check/ulcer care. In the meantime, patient to call office if any issues or problems arise.   Asencion Islamitorya Kieran Arreguin, DPM

## 2016-01-26 DIAGNOSIS — E11621 Type 2 diabetes mellitus with foot ulcer: Secondary | ICD-10-CM | POA: Diagnosis not present

## 2016-01-26 DIAGNOSIS — L97519 Non-pressure chronic ulcer of other part of right foot with unspecified severity: Secondary | ICD-10-CM | POA: Diagnosis not present

## 2016-01-26 DIAGNOSIS — M1990 Unspecified osteoarthritis, unspecified site: Secondary | ICD-10-CM | POA: Diagnosis not present

## 2016-01-26 DIAGNOSIS — Z792 Long term (current) use of antibiotics: Secondary | ICD-10-CM | POA: Diagnosis not present

## 2016-01-26 DIAGNOSIS — D649 Anemia, unspecified: Secondary | ICD-10-CM | POA: Diagnosis not present

## 2016-01-26 DIAGNOSIS — I350 Nonrheumatic aortic (valve) stenosis: Secondary | ICD-10-CM | POA: Diagnosis not present

## 2016-01-26 DIAGNOSIS — N181 Chronic kidney disease, stage 1: Secondary | ICD-10-CM | POA: Diagnosis not present

## 2016-01-26 DIAGNOSIS — E1159 Type 2 diabetes mellitus with other circulatory complications: Secondary | ICD-10-CM | POA: Diagnosis not present

## 2016-01-26 DIAGNOSIS — L03115 Cellulitis of right lower limb: Secondary | ICD-10-CM | POA: Diagnosis not present

## 2016-01-26 DIAGNOSIS — I129 Hypertensive chronic kidney disease with stage 1 through stage 4 chronic kidney disease, or unspecified chronic kidney disease: Secondary | ICD-10-CM | POA: Diagnosis not present

## 2016-01-26 DIAGNOSIS — Z7984 Long term (current) use of oral hypoglycemic drugs: Secondary | ICD-10-CM | POA: Diagnosis not present

## 2016-01-26 DIAGNOSIS — Z79891 Long term (current) use of opiate analgesic: Secondary | ICD-10-CM | POA: Diagnosis not present

## 2016-01-26 DIAGNOSIS — E1140 Type 2 diabetes mellitus with diabetic neuropathy, unspecified: Secondary | ICD-10-CM | POA: Diagnosis not present

## 2016-02-01 ENCOUNTER — Encounter: Payer: Medicare Other | Admitting: Sports Medicine

## 2016-02-01 ENCOUNTER — Encounter: Payer: Self-pay | Admitting: Sports Medicine

## 2016-02-01 ENCOUNTER — Ambulatory Visit (INDEPENDENT_AMBULATORY_CARE_PROVIDER_SITE_OTHER): Payer: Medicare Other | Admitting: Sports Medicine

## 2016-02-01 DIAGNOSIS — M79671 Pain in right foot: Secondary | ICD-10-CM

## 2016-02-01 DIAGNOSIS — L97512 Non-pressure chronic ulcer of other part of right foot with fat layer exposed: Secondary | ICD-10-CM

## 2016-02-01 DIAGNOSIS — E11621 Type 2 diabetes mellitus with foot ulcer: Secondary | ICD-10-CM

## 2016-02-01 DIAGNOSIS — E1142 Type 2 diabetes mellitus with diabetic polyneuropathy: Secondary | ICD-10-CM

## 2016-02-01 DIAGNOSIS — Z9889 Other specified postprocedural states: Secondary | ICD-10-CM

## 2016-02-01 NOTE — Progress Notes (Signed)
Subjective: Emily Miranda is a 75 y.o. female patient seen today in office for POV #3 (DOS 01-08-16), S/P Right foot wound debridement and placement of stravix allograft. Patient denies pain at surgical site, denies calf pain, denies headache, chest pain, shortness of breath, nausea, vomiting, fever, or chills. Patient states that she is doing well and reports that nursing has signed off and is no longer coming to her house for dressing changes, tus the patient has been changing outer layers herself when the dressing are soiled. No other issues noted.   FBS "good"  Patient Active Problem List   Diagnosis Date Noted  . Wound infection (HCC) 11/13/2015  . Abdominal pain, RLQ (right lower quadrant) -chronic since 2009 06/02/2013  . Constipation, chronic 06/02/2013  . SUI (stress urinary incontinence, female) 06/02/2013  . Recurrent UTI (urinary tract infection) 06/02/2013  . HTN (hypertension)   . CHEST PAIN 11/27/2009  . OVERWEIGHT 11/28/2008  . ESSENTIAL HYPERTENSION, BENIGN 11/28/2008  . DM 11/25/2008  . AORTIC STENOSIS 11/25/2008  . PANCREATITIS, HX OF 11/25/2008    Current Outpatient Prescriptions on File Prior to Visit  Medication Sig Dispense Refill  . amLODipine (NORVASC) 5 MG tablet Take 5 mg by mouth daily.     Marland Kitchen ampicillin (PRINCIPEN) 500 MG capsule Take 1 capsule (500 mg total) by mouth 4 (four) times daily. (Patient not taking: Reported on 11/13/2015) 56 capsule 0  . Ascorbic Acid (VITAMIN C) 100 MG tablet Take 100 mg by mouth daily.      . cephALEXin (KEFLEX) 500 MG capsule Take 1 capsule (500 mg total) by mouth 2 (two) times daily. (Patient not taking: Reported on 11/13/2015) 28 capsule 0  . chlorthalidone (HYGROTON) 25 MG tablet Take 0.5 tablets (12.5 mg total) by mouth daily. 15 tablet 11  . diphenoxylate-atropine (LOMOTIL) 2.5-0.025 MG tablet Take 1 tablet by mouth 4 (four) times daily as needed for diarrhea or loose stools. 30 tablet 0  . docusate sodium (COLACE) 100 MG  capsule Take 100 mg by mouth 2 (two) times daily. Take one every 8 hours prn constipation.    . ertapenem 1 g in sodium chloride 0.9 % 50 mL Inject 1 g into the vein daily. 13 Syringe 0  . fexofenadine (ALLEGRA) 180 MG tablet Take 180 mg by mouth daily.  5  . fluconazole (DIFLUCAN) 100 MG tablet Take two tablets daily for 14 days. 28 tablet 0  . furosemide (LASIX) 20 MG tablet Take 20 mg by mouth daily. Reported on 11/13/2015    . glimepiride (AMARYL) 4 MG tablet Take 4 mg by mouth daily with breakfast.    . HYDROcodone-acetaminophen (NORCO) 10-325 MG tablet Take 1 tablet by mouth every 6 (six) hours as needed.    Marland Kitchen HYDROcodone-acetaminophen (NORCO) 5-325 MG per tablet Take 1 tablet by mouth every 6 (six) hours as needed. 10 tablet 0  . HYDROcodone-acetaminophen (NORCO/VICODIN) 5-325 MG tablet Take by mouth.    Pincus Sanes 1 g injection     . linagliptin (TRADJENTA) 5 MG TABS tablet Take by mouth.    . linezolid (ZYVOX) 600 MG/300ML IVPB Inject 300 mLs (600 mg total) into the vein every 12 (twelve) hours. (Patient not taking: Reported on 11/13/2015) 100 mL 23  . lisinopril (PRINIVIL,ZESTRIL) 10 MG tablet Take 10 mg by mouth daily.    Marland Kitchen lisinopril (PRINIVIL,ZESTRIL) 30 MG tablet     . lubiprostone (AMITIZA) 8 MCG capsule TAKE 1 CAPSULE (8 MCG TOTAL) BY MOUTH 2 (TWO) TIMES A DAY WITH  MEALS.    . methocarbamol (ROBAXIN) 500 MG tablet Take 1.5 tablets (750 mg total) by mouth every 8 (eight) hours as needed for muscle spasms (use for muscle cramps/pain). (Patient not taking: Reported on 11/13/2015) 30 tablet 2  . naproxen (NAPROSYN) 500 MG tablet Take 1 tablet (500 mg total) by mouth 2 (two) times daily with a meal. (Patient not taking: Reported on 11/13/2015) 40 tablet 1  . nitrofurantoin (MACRODANTIN) 50 MG capsule     . nitrofurantoin, macrocrystal-monohydrate, (MACROBID) 100 MG capsule Take 100 mg by mouth 2 (two) times daily.     . nitroGLYCERIN (NITRODUR - DOSED IN MG/24 HR) 0.1 mg/hr patch  Frequency:UNKNOWN   Dosage:0.0     Instructions:  Note:Dose: .    . omeprazole (PRILOSEC) 20 MG capsule     . omeprazole (PRILOSEC) 20 MG capsule TAKE 1 CAPSULE BY MOUTH IN THE MORNING    . ONE TOUCH ULTRA TEST test strip 1 each by Other route once a week.     Marland Kitchen. oxyCODONE-acetaminophen (PERCOCET/ROXICET) 5-325 MG tablet Take 1-2 tablets PO Q4H PRN pain    . Phenyleph-Doxylamine-DM-APAP (ALKA SELTZER PLUS PO) Take 1 tablet by mouth daily as needed (congestion). Reported on 11/13/2015    . pregabalin (LYRICA) 75 MG capsule Take 1 capsule (75 mg total) by mouth 2 (two) times daily. (Patient not taking: Reported on 11/13/2015) 60 capsule 1  . promethazine (PHENERGAN) 25 MG tablet Take 25 mg by mouth every 8 (eight) hours as needed for nausea or vomiting.    . sodium chloride 0.9 % infusion     . sulfamethoxazole-trimethoprim (BACTRIM DS,SEPTRA DS) 800-160 MG tablet Take 1 tablet by mouth 2 (two) times daily. 20 tablet 0  . tetracycline (ACHROMYCIN,SUMYCIN) 500 MG capsule TAKE 1 CAPSULE (500 MG TOTAL) BY MOUTH 2 (TWO) TIMES DAILY. (Patient not taking: Reported on 11/13/2015) 28 capsule 0  . tetracycline (ACHROMYCIN,SUMYCIN) 500 MG capsule Take 1 capsule (500 mg total) by mouth 2 (two) times daily. (Patient not taking: Reported on 11/13/2015) 28 capsule 0  . TRADJENTA 5 MG TABS tablet Take 5 mg by mouth daily.     . traMADol (ULTRAM) 50 MG tablet TAKE 1 TABLET BY MOUTH EVERY 8 HOURS AS NEEDED 30 tablet 0  . vancomycin (VANCOCIN) 10 G SOLR injection      Current Facility-Administered Medications on File Prior to Visit  Medication Dose Route Frequency Provider Last Rate Last Dose  . triamcinolone acetonide (KENALOG) 10 MG/ML injection 10 mg  10 mg Other Once Asencion Islamitorya Asser Lucena, DPM        Allergies  Allergen Reactions  . Levofloxacin Nausea And Vomiting    Other reaction(s): Confusion    Objective: There were no vitals filed for this visit.  General: No acute distress, AAOx3  Right foot: Sutures Almost  completely absorbed at wound graft site with complete incorporation of graft--ulceration measures 2x3x0.3cm (smaller than previous) with budding granular base, no erythema, no warmth, no drainage, no signs of infection noted, Capillary fill time <3 seconds in all digits, gross sensation present via light touch to right foot. No pain or crepitation with range of motion right foot.  No pain with calf compression.   Assessment and Plan:  Problem List Items Addressed This Visit    None    Visit Diagnoses    Diabetic ulcer of right foot associated with type 2 diabetes mellitus, with fat layer exposed (HCC)    -  Primary   S/P foot surgery, right  Diabetic polyneuropathy associated with type 2 diabetes mellitus (HCC)       Right foot pain          -Patient seen and evaluated -Cleansed ulceration with saline moistened gauze and then Applied Prisma AG collagen dressing secured with steristrips and dry sterile dressing to surgical site/wound right foot secured with ACE wrap and stockinet  -Advised patient to make sure to keep dressings clean, dry, and intact to right surgical site today, removing the ACE and changing layers of Prisma dressing 2 times a week until seen visit -Continue with limited weightbearing to right foot and use of rolling walker -Advised patient to limit activity to necessity  -Continue with PRN and pain meds as needed -Advised patient to ice and elevate as necessary  -Return in 1 week for graft site check/ulcer care. In the meantime, patient to call office if any issues or problems arise.   Asencion Islam, DPM

## 2016-02-02 DIAGNOSIS — L97511 Non-pressure chronic ulcer of other part of right foot limited to breakdown of skin: Secondary | ICD-10-CM | POA: Diagnosis not present

## 2016-02-02 DIAGNOSIS — L97512 Non-pressure chronic ulcer of other part of right foot with fat layer exposed: Secondary | ICD-10-CM | POA: Diagnosis not present

## 2016-02-08 ENCOUNTER — Telehealth: Payer: Self-pay | Admitting: *Deleted

## 2016-02-08 ENCOUNTER — Encounter: Payer: Self-pay | Admitting: Sports Medicine

## 2016-02-08 ENCOUNTER — Ambulatory Visit (INDEPENDENT_AMBULATORY_CARE_PROVIDER_SITE_OTHER): Payer: Medicare Other | Admitting: Sports Medicine

## 2016-02-08 DIAGNOSIS — Z9889 Other specified postprocedural states: Secondary | ICD-10-CM

## 2016-02-08 DIAGNOSIS — M79671 Pain in right foot: Secondary | ICD-10-CM

## 2016-02-08 DIAGNOSIS — L97512 Non-pressure chronic ulcer of other part of right foot with fat layer exposed: Secondary | ICD-10-CM

## 2016-02-08 DIAGNOSIS — L97519 Non-pressure chronic ulcer of other part of right foot with unspecified severity: Secondary | ICD-10-CM

## 2016-02-08 DIAGNOSIS — E1142 Type 2 diabetes mellitus with diabetic polyneuropathy: Secondary | ICD-10-CM

## 2016-02-08 DIAGNOSIS — E11621 Type 2 diabetes mellitus with foot ulcer: Secondary | ICD-10-CM

## 2016-02-08 NOTE — Progress Notes (Signed)
Subjective: Emily Miranda is a 75 y.o. female patient seen today in office for POV #4 (DOS 01-08-16), S/P Right foot wound debridement and placement of stravix allograft. Patient denies pain at surgical site, denies calf pain, denies headache, chest pain, shortness of breath, nausea, vomiting, fever, or chills. No other issues noted.   FBS "good" per patient  Patient Active Problem List   Diagnosis Date Noted  . Wound infection (HCC) 11/13/2015  . Abdominal pain, RLQ (right lower quadrant) -chronic since 2009 06/02/2013  . Constipation, chronic 06/02/2013  . SUI (stress urinary incontinence, female) 06/02/2013  . Recurrent UTI (urinary tract infection) 06/02/2013  . HTN (hypertension)   . CHEST PAIN 11/27/2009  . OVERWEIGHT 11/28/2008  . ESSENTIAL HYPERTENSION, BENIGN 11/28/2008  . DM 11/25/2008  . AORTIC STENOSIS 11/25/2008  . PANCREATITIS, HX OF 11/25/2008    Current Outpatient Prescriptions on File Prior to Visit  Medication Sig Dispense Refill  . amLODipine (NORVASC) 5 MG tablet Take 5 mg by mouth daily.     Marland Kitchen ampicillin (PRINCIPEN) 500 MG capsule Take 1 capsule (500 mg total) by mouth 4 (four) times daily. (Patient not taking: Reported on 11/13/2015) 56 capsule 0  . Ascorbic Acid (VITAMIN C) 100 MG tablet Take 100 mg by mouth daily.      . cephALEXin (KEFLEX) 500 MG capsule Take 1 capsule (500 mg total) by mouth 2 (two) times daily. (Patient not taking: Reported on 11/13/2015) 28 capsule 0  . chlorthalidone (HYGROTON) 25 MG tablet Take 0.5 tablets (12.5 mg total) by mouth daily. 15 tablet 11  . diphenoxylate-atropine (LOMOTIL) 2.5-0.025 MG tablet Take 1 tablet by mouth 4 (four) times daily as needed for diarrhea or loose stools. 30 tablet 0  . docusate sodium (COLACE) 100 MG capsule Take 100 mg by mouth 2 (two) times daily. Take one every 8 hours prn constipation.    . ertapenem 1 g in sodium chloride 0.9 % 50 mL Inject 1 g into the vein daily. 13 Syringe 0  . fexofenadine  (ALLEGRA) 180 MG tablet Take 180 mg by mouth daily.  5  . fluconazole (DIFLUCAN) 100 MG tablet Take two tablets daily for 14 days. 28 tablet 0  . furosemide (LASIX) 20 MG tablet Take 20 mg by mouth daily. Reported on 11/13/2015    . glimepiride (AMARYL) 4 MG tablet Take 4 mg by mouth daily with breakfast.    . HYDROcodone-acetaminophen (NORCO) 10-325 MG tablet Take 1 tablet by mouth every 6 (six) hours as needed.    Marland Kitchen HYDROcodone-acetaminophen (NORCO) 5-325 MG per tablet Take 1 tablet by mouth every 6 (six) hours as needed. 10 tablet 0  . HYDROcodone-acetaminophen (NORCO/VICODIN) 5-325 MG tablet Take by mouth.    Pincus Sanes 1 g injection     . linagliptin (TRADJENTA) 5 MG TABS tablet Take by mouth.    . linezolid (ZYVOX) 600 MG/300ML IVPB Inject 300 mLs (600 mg total) into the vein every 12 (twelve) hours. (Patient not taking: Reported on 11/13/2015) 100 mL 23  . lisinopril (PRINIVIL,ZESTRIL) 10 MG tablet Take 10 mg by mouth daily.    Marland Kitchen lisinopril (PRINIVIL,ZESTRIL) 30 MG tablet     . lubiprostone (AMITIZA) 8 MCG capsule TAKE 1 CAPSULE (8 MCG TOTAL) BY MOUTH 2 (TWO) TIMES A DAY WITH MEALS.    . methocarbamol (ROBAXIN) 500 MG tablet Take 1.5 tablets (750 mg total) by mouth every 8 (eight) hours as needed for muscle spasms (use for muscle cramps/pain). (Patient not taking: Reported on 11/13/2015)  30 tablet 2  . naproxen (NAPROSYN) 500 MG tablet Take 1 tablet (500 mg total) by mouth 2 (two) times daily with a meal. (Patient not taking: Reported on 11/13/2015) 40 tablet 1  . nitrofurantoin (MACRODANTIN) 50 MG capsule     . nitrofurantoin, macrocrystal-monohydrate, (MACROBID) 100 MG capsule Take 100 mg by mouth 2 (two) times daily.     . nitroGLYCERIN (NITRODUR - DOSED IN MG/24 HR) 0.1 mg/hr patch Frequency:UNKNOWN   Dosage:0.0     Instructions:  Note:Dose: .    . omeprazole (PRILOSEC) 20 MG capsule     . omeprazole (PRILOSEC) 20 MG capsule TAKE 1 CAPSULE BY MOUTH IN THE MORNING    . ONE TOUCH ULTRA TEST test  strip 1 each by Other route once a week.     Marland Kitchen oxyCODONE-acetaminophen (PERCOCET/ROXICET) 5-325 MG tablet Take 1-2 tablets PO Q4H PRN pain    . Phenyleph-Doxylamine-DM-APAP (ALKA SELTZER PLUS PO) Take 1 tablet by mouth daily as needed (congestion). Reported on 11/13/2015    . pregabalin (LYRICA) 75 MG capsule Take 1 capsule (75 mg total) by mouth 2 (two) times daily. (Patient not taking: Reported on 11/13/2015) 60 capsule 1  . promethazine (PHENERGAN) 25 MG tablet Take 25 mg by mouth every 8 (eight) hours as needed for nausea or vomiting.    . sodium chloride 0.9 % infusion     . sulfamethoxazole-trimethoprim (BACTRIM DS,SEPTRA DS) 800-160 MG tablet Take 1 tablet by mouth 2 (two) times daily. 20 tablet 0  . tetracycline (ACHROMYCIN,SUMYCIN) 500 MG capsule TAKE 1 CAPSULE (500 MG TOTAL) BY MOUTH 2 (TWO) TIMES DAILY. (Patient not taking: Reported on 11/13/2015) 28 capsule 0  . tetracycline (ACHROMYCIN,SUMYCIN) 500 MG capsule Take 1 capsule (500 mg total) by mouth 2 (two) times daily. (Patient not taking: Reported on 11/13/2015) 28 capsule 0  . TRADJENTA 5 MG TABS tablet Take 5 mg by mouth daily.     . traMADol (ULTRAM) 50 MG tablet TAKE 1 TABLET BY MOUTH EVERY 8 HOURS AS NEEDED 30 tablet 0  . vancomycin (VANCOCIN) 10 G SOLR injection      Current Facility-Administered Medications on File Prior to Visit  Medication Dose Route Frequency Provider Last Rate Last Dose  . triamcinolone acetonide (KENALOG) 10 MG/ML injection 10 mg  10 mg Other Once Asencion Islam, DPM        Allergies  Allergen Reactions  . Levofloxacin Nausea And Vomiting    Other reaction(s): Confusion    Objective: There were no vitals filed for this visit.  General: No acute distress, AAOx3  Right foot: Sutures absorbed at wound graft site with complete incorporation of graft--ulceration measures 2.5x3.5x0.3cm (0.5cm larger than previous) with budding granular base, no erythema, no warmth, no drainage, no signs of infection noted,  Capillary fill time <3 seconds in all digits, gross sensation present via light touch to right foot. No pain or crepitation with range of motion right foot.  No pain with calf compression.   Assessment and Plan:  Problem List Items Addressed This Visit    None    Visit Diagnoses    S/P foot surgery, right    -  Primary   Diabetic ulcer of right foot associated with type 2 diabetes mellitus, with fat layer exposed (HCC)       Diabetic polyneuropathy associated with type 2 diabetes mellitus (HCC)       Right foot pain          -Patient seen and evaluated -Cleansed ulceration with  saline moistened gauze and then Applied Prisma AG collagen dressing secured with steristrips and dry sterile dressing to surgical site/wound right foot secured with ACE wrap and stockinet  -Advised patient to make sure to keep dressings clean, dry, and intact to right surgical site today, removing the ACE and changing layers of Prisma dressing 2 times a week until seen visit -Continue with limited weightbearing to right foot and use of rolling walker -Advised patient to limit activity to necessity  -Continue with PRN and pain meds as needed -Advised patient to ice and elevate as necessary  -Return in 1 week for graft site check/ulcer care. In the meantime, patient to call office if any issues or problems arise. Requested plastic surgery evaluation for consideration of flap or skin grafting procedure to assist with healing chronic ulceration.   Asencion Islamitorya Frankee Gritz, DPM

## 2016-02-08 NOTE — Telephone Encounter (Addendum)
-----   Message from Asencion Islam, [place] sent at 02/08/2016  2:54 PM EDT ----- Regarding: Plastic Surgery Consult Chronic Right diabetic foot ulceration S/p recent debridement with stravix allograft. Diabetic with History of previous incision and drainage, PICC line antibiotics, repeat debridements, primary closure attempt, wound and tissue biopsy that reveals chronic inflamed tissue.  Please evaluate patient for derotational flap or other skin grafting procedure Thanks Dr Marylene Land. Referral to Dr. Foster Simpson, fax NP information to [facility] [phone]. 02/09/2016-Unable to faxe pt's records to [facility]. I informed Debbie - Dr. Kittie Plater office, I would be mailing. Mailed cover sheet with Dr. [name] request, referral, pt's clinicals and demographics to:  [facility], [address], [place], [place] [address]. 02/16/2016-Dr. Dillingham office - Crystal states pt's chart notes are on Dr. Kittie Plater office for review and they will call me when pt is scheduled. 02/27/2016-Tramadol called to [phone].

## 2016-02-12 DIAGNOSIS — L97512 Non-pressure chronic ulcer of other part of right foot with fat layer exposed: Secondary | ICD-10-CM | POA: Diagnosis not present

## 2016-02-12 DIAGNOSIS — L97511 Non-pressure chronic ulcer of other part of right foot limited to breakdown of skin: Secondary | ICD-10-CM | POA: Diagnosis not present

## 2016-02-15 ENCOUNTER — Ambulatory Visit (INDEPENDENT_AMBULATORY_CARE_PROVIDER_SITE_OTHER): Payer: Medicare Other | Admitting: Sports Medicine

## 2016-02-15 ENCOUNTER — Encounter: Payer: Self-pay | Admitting: Sports Medicine

## 2016-02-15 DIAGNOSIS — M79671 Pain in right foot: Secondary | ICD-10-CM

## 2016-02-15 DIAGNOSIS — E1142 Type 2 diabetes mellitus with diabetic polyneuropathy: Secondary | ICD-10-CM

## 2016-02-15 DIAGNOSIS — Z9889 Other specified postprocedural states: Secondary | ICD-10-CM

## 2016-02-15 DIAGNOSIS — E11621 Type 2 diabetes mellitus with foot ulcer: Secondary | ICD-10-CM

## 2016-02-15 DIAGNOSIS — L97412 Non-pressure chronic ulcer of right heel and midfoot with fat layer exposed: Secondary | ICD-10-CM

## 2016-02-15 NOTE — Progress Notes (Signed)
Subjective: Emily Miranda is a 75 y.o. female patient seen today in office for POV #5 (DOS 01-08-16), S/P Right foot wound debridement and placement of stravix allograft. Patient currently applying primsa Ag as instructed; denies pain at surgical site, denies calf pain, denies headache, chest pain, shortness of breath, nausea, vomiting, fever, or chills. No other issues noted.   FBS "good" per patient  Patient Active Problem List   Diagnosis Date Noted  . Wound infection 11/13/2015  . Abdominal pain, RLQ (right lower quadrant) -chronic since 2009 06/02/2013  . Constipation, chronic 06/02/2013  . SUI (stress urinary incontinence, female) 06/02/2013  . Recurrent UTI (urinary tract infection) 06/02/2013  . HTN (hypertension)   . CHEST PAIN 11/27/2009  . OVERWEIGHT 11/28/2008  . ESSENTIAL HYPERTENSION, BENIGN 11/28/2008  . DM 11/25/2008  . AORTIC STENOSIS 11/25/2008  . PANCREATITIS, HX OF 11/25/2008    Current Outpatient Prescriptions on File Prior to Visit  Medication Sig Dispense Refill  . amLODipine (NORVASC) 5 MG tablet Take 5 mg by mouth daily.     Marland Kitchen ampicillin (PRINCIPEN) 500 MG capsule Take 1 capsule (500 mg total) by mouth 4 (four) times daily. (Patient not taking: Reported on 11/13/2015) 56 capsule 0  . Ascorbic Acid (VITAMIN C) 100 MG tablet Take 100 mg by mouth daily.      . cephALEXin (KEFLEX) 500 MG capsule Take 1 capsule (500 mg total) by mouth 2 (two) times daily. (Patient not taking: Reported on 11/13/2015) 28 capsule 0  . chlorthalidone (HYGROTON) 25 MG tablet Take 0.5 tablets (12.5 mg total) by mouth daily. 15 tablet 11  . diphenoxylate-atropine (LOMOTIL) 2.5-0.025 MG tablet Take 1 tablet by mouth 4 (four) times daily as needed for diarrhea or loose stools. 30 tablet 0  . docusate sodium (COLACE) 100 MG capsule Take 100 mg by mouth 2 (two) times daily. Take one every 8 hours prn constipation.    . ertapenem 1 g in sodium chloride 0.9 % 50 mL Inject 1 g into the vein daily.  13 Syringe 0  . fexofenadine (ALLEGRA) 180 MG tablet Take 180 mg by mouth daily.  5  . fluconazole (DIFLUCAN) 100 MG tablet Take two tablets daily for 14 days. 28 tablet 0  . furosemide (LASIX) 20 MG tablet Take 20 mg by mouth daily. Reported on 11/13/2015    . glimepiride (AMARYL) 4 MG tablet Take 4 mg by mouth daily with breakfast.    . HYDROcodone-acetaminophen (NORCO) 10-325 MG tablet Take 1 tablet by mouth every 6 (six) hours as needed.    Marland Kitchen HYDROcodone-acetaminophen (NORCO) 5-325 MG per tablet Take 1 tablet by mouth every 6 (six) hours as needed. 10 tablet 0  . HYDROcodone-acetaminophen (NORCO/VICODIN) 5-325 MG tablet Take by mouth.    Pincus Sanes 1 g injection     . linagliptin (TRADJENTA) 5 MG TABS tablet Take by mouth.    . linezolid (ZYVOX) 600 MG/300ML IVPB Inject 300 mLs (600 mg total) into the vein every 12 (twelve) hours. (Patient not taking: Reported on 11/13/2015) 100 mL 23  . lisinopril (PRINIVIL,ZESTRIL) 10 MG tablet Take 10 mg by mouth daily.    Marland Kitchen lisinopril (PRINIVIL,ZESTRIL) 30 MG tablet     . lubiprostone (AMITIZA) 8 MCG capsule TAKE 1 CAPSULE (8 MCG TOTAL) BY MOUTH 2 (TWO) TIMES A DAY WITH MEALS.    . methocarbamol (ROBAXIN) 500 MG tablet Take 1.5 tablets (750 mg total) by mouth every 8 (eight) hours as needed for muscle spasms (use for muscle cramps/pain). (Patient  not taking: Reported on 11/13/2015) 30 tablet 2  . naproxen (NAPROSYN) 500 MG tablet Take 1 tablet (500 mg total) by mouth 2 (two) times daily with a meal. (Patient not taking: Reported on 11/13/2015) 40 tablet 1  . nitrofurantoin (MACRODANTIN) 50 MG capsule     . nitrofurantoin, macrocrystal-monohydrate, (MACROBID) 100 MG capsule Take 100 mg by mouth 2 (two) times daily.     . nitroGLYCERIN (NITRODUR - DOSED IN MG/24 HR) 0.1 mg/hr patch Frequency:UNKNOWN   Dosage:0.0     Instructions:  Note:Dose: .    . omeprazole (PRILOSEC) 20 MG capsule     . omeprazole (PRILOSEC) 20 MG capsule TAKE 1 CAPSULE BY MOUTH IN THE MORNING     . ONE TOUCH ULTRA TEST test strip 1 each by Other route once a week.     Marland Kitchen. oxyCODONE-acetaminophen (PERCOCET/ROXICET) 5-325 MG tablet Take 1-2 tablets PO Q4H PRN pain    . Phenyleph-Doxylamine-DM-APAP (ALKA SELTZER PLUS PO) Take 1 tablet by mouth daily as needed (congestion). Reported on 11/13/2015    . pregabalin (LYRICA) 75 MG capsule Take 1 capsule (75 mg total) by mouth 2 (two) times daily. (Patient not taking: Reported on 11/13/2015) 60 capsule 1  . promethazine (PHENERGAN) 25 MG tablet Take 25 mg by mouth every 8 (eight) hours as needed for nausea or vomiting.    . sodium chloride 0.9 % infusion     . sulfamethoxazole-trimethoprim (BACTRIM DS,SEPTRA DS) 800-160 MG tablet Take 1 tablet by mouth 2 (two) times daily. 20 tablet 0  . tetracycline (ACHROMYCIN,SUMYCIN) 500 MG capsule TAKE 1 CAPSULE (500 MG TOTAL) BY MOUTH 2 (TWO) TIMES DAILY. (Patient not taking: Reported on 11/13/2015) 28 capsule 0  . tetracycline (ACHROMYCIN,SUMYCIN) 500 MG capsule Take 1 capsule (500 mg total) by mouth 2 (two) times daily. (Patient not taking: Reported on 11/13/2015) 28 capsule 0  . TRADJENTA 5 MG TABS tablet Take 5 mg by mouth daily.     . traMADol (ULTRAM) 50 MG tablet TAKE 1 TABLET BY MOUTH EVERY 8 HOURS AS NEEDED 30 tablet 0  . vancomycin (VANCOCIN) 10 G SOLR injection      Current Facility-Administered Medications on File Prior to Visit  Medication Dose Route Frequency Provider Last Rate Last Dose  . triamcinolone acetonide (KENALOG) 10 MG/ML injection 10 mg  10 mg Other Once Asencion Islamitorya Kenechukwu Eckstein, DPM        Allergies  Allergen Reactions  . Levofloxacin Nausea And Vomiting    Other reaction(s): Confusion    Objective: There were no vitals filed for this visit.  General: No acute distress, AAOx3  Right foot: Sutures absorbed at wound graft site with complete incorporation of graft--ulceration measures 2.5x3.5x0.3cm (Last measurement same) with budding granular base, no erythema, no warmth, no drainage, no signs  of infection noted, Capillary fill time <3 seconds in all digits, gross sensation present via light touch to right foot. No pain or crepitation with range of motion right foot.  No pain with calf compression.   Assessment and Plan:  Problem List Items Addressed This Visit    None    Visit Diagnoses    Post-operative state    -  Primary   Diabetic ulcer of right midfoot associated with type 2 diabetes mellitus, with fat layer exposed (HCC)       Diabetic polyneuropathy associated with type 2 diabetes mellitus (HCC)       Right foot pain          -Patient seen and evaluated -Cleansed  ulceration with saline moistened gauze and then Applied Prisma AG collagen dressing secured with steristrips and dry sterile dressing to surgical site/wound right foot secured with ACE wrap and stockinet  -Advised patient to make sure to keep dressings clean, dry, and intact to right surgical site today, removing the ACE and changing layers of Prisma dressing 2 times a week until seen visit; anticipate continue with prisma until plastics eval -Continue with limited weightbearing to right foot and use of rolling walker -Advised patient to limit activity to necessity  -Continue with PRN and pain meds as needed -Advised patient to ice and elevate as necessary  -Return in 1 week for ulcer care. In the meantime, patient to call office if any issues or problems arise.Awaiting plastic surgery evaluation for consideration of flap or skin grafting procedure to assist with healing chronic ulceration.   Asencion Islam, DPM

## 2016-02-16 NOTE — Telephone Encounter (Signed)
Ok great. Thanks!

## 2016-02-21 DIAGNOSIS — L97512 Non-pressure chronic ulcer of other part of right foot with fat layer exposed: Secondary | ICD-10-CM | POA: Diagnosis not present

## 2016-02-21 DIAGNOSIS — L97511 Non-pressure chronic ulcer of other part of right foot limited to breakdown of skin: Secondary | ICD-10-CM | POA: Diagnosis not present

## 2016-02-22 ENCOUNTER — Encounter: Payer: Self-pay | Admitting: Sports Medicine

## 2016-02-22 ENCOUNTER — Ambulatory Visit (INDEPENDENT_AMBULATORY_CARE_PROVIDER_SITE_OTHER): Payer: Medicare Other | Admitting: Sports Medicine

## 2016-02-22 DIAGNOSIS — E1142 Type 2 diabetes mellitus with diabetic polyneuropathy: Secondary | ICD-10-CM

## 2016-02-22 DIAGNOSIS — L97412 Non-pressure chronic ulcer of right heel and midfoot with fat layer exposed: Secondary | ICD-10-CM | POA: Diagnosis not present

## 2016-02-22 DIAGNOSIS — E11621 Type 2 diabetes mellitus with foot ulcer: Secondary | ICD-10-CM

## 2016-02-22 DIAGNOSIS — L97513 Non-pressure chronic ulcer of other part of right foot with necrosis of muscle: Secondary | ICD-10-CM

## 2016-02-22 DIAGNOSIS — Z9889 Other specified postprocedural states: Secondary | ICD-10-CM

## 2016-02-22 DIAGNOSIS — M79671 Pain in right foot: Secondary | ICD-10-CM

## 2016-02-22 MED ORDER — TETRACYCLINE HCL 500 MG PO CAPS: 500.0000 mg | ORAL_CAPSULE | Freq: Two times a day (BID) | ORAL | 0 refills | Status: AC

## 2016-02-22 MED ORDER — AMPICILLIN 500 MG PO CAPS: 500.0000 mg | ORAL_CAPSULE | Freq: Four times a day (QID) | ORAL | 0 refills | Status: AC

## 2016-02-22 NOTE — Progress Notes (Signed)
Subjective: Emily Miranda is a 75 y.o. female patient seen today in office for POV #6 (DOS 01-08-16), S/P Right foot wound debridement and placement of stravix allograft. Patient currently applying primsa Ag as instructed; admits to increased drainage and odor last night; denies pain at surgical site, denies calf pain, denies headache, chest pain, shortness of breath, nausea, vomiting, fever, or chills. No other issues noted.   FBS "good" per patient  Patient Active Problem List   Diagnosis Date Noted  . Wound infection 11/13/2015  . Abdominal pain, RLQ (right lower quadrant) -chronic since 2009 06/02/2013  . Constipation, chronic 06/02/2013  . SUI (stress urinary incontinence, female) 06/02/2013  . Recurrent UTI (urinary tract infection) 06/02/2013  . HTN (hypertension)   . CHEST PAIN 11/27/2009  . OVERWEIGHT 11/28/2008  . ESSENTIAL HYPERTENSION, BENIGN 11/28/2008  . DM 11/25/2008  . AORTIC STENOSIS 11/25/2008  . PANCREATITIS, HX OF 11/25/2008    Current Outpatient Prescriptions on File Prior to Visit  Medication Sig Dispense Refill  . amLODipine (NORVASC) 5 MG tablet Take 5 mg by mouth daily.     Marland Kitchen amoxicillin (AMOXIL) 875 MG tablet     . ampicillin (PRINCIPEN) 500 MG capsule Take 1 capsule (500 mg total) by mouth 4 (four) times daily. (Patient not taking: Reported on 11/13/2015) 56 capsule 0  . Ascorbic Acid (VITAMIN C) 100 MG tablet Take 100 mg by mouth daily.      . cephALEXin (KEFLEX) 500 MG capsule Take 1 capsule (500 mg total) by mouth 2 (two) times daily. (Patient not taking: Reported on 11/13/2015) 28 capsule 0  . chlorthalidone (HYGROTON) 25 MG tablet Take 0.5 tablets (12.5 mg total) by mouth daily. 15 tablet 11  . diphenoxylate-atropine (LOMOTIL) 2.5-0.025 MG tablet Take 1 tablet by mouth 4 (four) times daily as needed for diarrhea or loose stools. 30 tablet 0  . docusate sodium (COLACE) 100 MG capsule Take 100 mg by mouth 2 (two) times daily. Take one every 8 hours prn  constipation.    . ertapenem 1 g in sodium chloride 0.9 % 50 mL Inject 1 g into the vein daily. 13 Syringe 0  . fexofenadine (ALLEGRA) 180 MG tablet Take 180 mg by mouth daily.  5  . fluconazole (DIFLUCAN) 100 MG tablet Take two tablets daily for 14 days. 28 tablet 0  . fluticasone (FLONASE) 50 MCG/ACT nasal spray     . furosemide (LASIX) 20 MG tablet Take 20 mg by mouth daily. Reported on 11/13/2015    . glimepiride (AMARYL) 4 MG tablet Take 4 mg by mouth daily with breakfast.    . HYDROcodone-acetaminophen (NORCO) 10-325 MG tablet Take 1 tablet by mouth every 6 (six) hours as needed.    Marland Kitchen HYDROcodone-acetaminophen (NORCO) 5-325 MG per tablet Take 1 tablet by mouth every 6 (six) hours as needed. 10 tablet 0  . HYDROcodone-acetaminophen (NORCO/VICODIN) 5-325 MG tablet Take by mouth.    Pincus Sanes 1 g injection     . linagliptin (TRADJENTA) 5 MG TABS tablet Take by mouth.    . linezolid (ZYVOX) 600 MG/300ML IVPB Inject 300 mLs (600 mg total) into the vein every 12 (twelve) hours. (Patient not taking: Reported on 11/13/2015) 100 mL 23  . lisinopril (PRINIVIL,ZESTRIL) 10 MG tablet Take 10 mg by mouth daily.    Marland Kitchen lisinopril (PRINIVIL,ZESTRIL) 30 MG tablet     . lubiprostone (AMITIZA) 8 MCG capsule TAKE 1 CAPSULE (8 MCG TOTAL) BY MOUTH 2 (TWO) TIMES A DAY WITH MEALS.    Marland Kitchen  methocarbamol (ROBAXIN) 500 MG tablet Take 1.5 tablets (750 mg total) by mouth every 8 (eight) hours as needed for muscle spasms (use for muscle cramps/pain). (Patient not taking: Reported on 11/13/2015) 30 tablet 2  . naproxen (NAPROSYN) 500 MG tablet Take 1 tablet (500 mg total) by mouth 2 (two) times daily with a meal. (Patient not taking: Reported on 11/13/2015) 40 tablet 1  . nitrofurantoin (MACRODANTIN) 50 MG capsule     . nitrofurantoin, macrocrystal-monohydrate, (MACROBID) 100 MG capsule Take 100 mg by mouth 2 (two) times daily.     . nitroGLYCERIN (NITRODUR - DOSED IN MG/24 HR) 0.1 mg/hr patch Frequency:UNKNOWN   Dosage:0.0      Instructions:  Note:Dose: .    . omeprazole (PRILOSEC) 20 MG capsule     . omeprazole (PRILOSEC) 20 MG capsule TAKE 1 CAPSULE BY MOUTH IN THE MORNING    . ONE TOUCH ULTRA TEST test strip 1 each by Other route once a week.     Marland Kitchen oxyCODONE-acetaminophen (PERCOCET/ROXICET) 5-325 MG tablet Take 1-2 tablets PO Q4H PRN pain    . Phenyleph-Doxylamine-DM-APAP (ALKA SELTZER PLUS PO) Take 1 tablet by mouth daily as needed (congestion). Reported on 11/13/2015    . pregabalin (LYRICA) 75 MG capsule Take 1 capsule (75 mg total) by mouth 2 (two) times daily. (Patient not taking: Reported on 11/13/2015) 60 capsule 1  . promethazine (PHENERGAN) 25 MG tablet Take 25 mg by mouth every 8 (eight) hours as needed for nausea or vomiting.    . sodium chloride 0.9 % infusion     . sulfamethoxazole-trimethoprim (BACTRIM DS,SEPTRA DS) 800-160 MG tablet Take 1 tablet by mouth 2 (two) times daily. 20 tablet 0  . tetracycline (ACHROMYCIN,SUMYCIN) 500 MG capsule TAKE 1 CAPSULE (500 MG TOTAL) BY MOUTH 2 (TWO) TIMES DAILY. (Patient not taking: Reported on 11/13/2015) 28 capsule 0  . tetracycline (ACHROMYCIN,SUMYCIN) 500 MG capsule Take 1 capsule (500 mg total) by mouth 2 (two) times daily. (Patient not taking: Reported on 11/13/2015) 28 capsule 0  . TRADJENTA 5 MG TABS tablet Take 5 mg by mouth daily.     . traMADol (ULTRAM) 50 MG tablet TAKE 1 TABLET BY MOUTH EVERY 8 HOURS AS NEEDED 30 tablet 0  . vancomycin (VANCOCIN) 10 G SOLR injection      Current Facility-Administered Medications on File Prior to Visit  Medication Dose Route Frequency Provider Last Rate Last Dose  . triamcinolone acetonide (KENALOG) 10 MG/ML injection 10 mg  10 mg Other Once Asencion Islam, DPM        Allergies  Allergen Reactions  . Levofloxacin Nausea And Vomiting    Other reaction(s): Confusion    Objective: There were no vitals filed for this visit.  General: No acute distress, AAOx3  Right foot: Wound graft site with complete  incorporation--ulceration measures 3x4x0.3cm (Last measurement smaller) with budding hypergranular granular base, no erythema, no warmth, no active drainage, no signs of infection noted, Capillary fill time <3 seconds in all digits, gross sensation present via light touch to right foot. No pain or crepitation with range of motion right foot.  No pain with calf compression.   Assessment and Plan:  Problem List Items Addressed This Visit    None    Visit Diagnoses    Diabetic ulcer of right midfoot associated with type 2 diabetes mellitus, with fat layer exposed (HCC)    -  Primary   Diabetic polyneuropathy associated with type 2 diabetes mellitus (HCC)       Post-operative  state       Right foot pain       Right foot ulcer, with necrosis of muscle (HCC)       Relevant Medications   tetracycline (ACHROMYCIN,SUMYCIN) 500 MG capsule   ampicillin (PRINCIPEN) 500 MG capsule     -Patient seen and evaluated -Cleansed ulceration with saline moistened gauze and then debrided ulceration peri-wound using tissue nipper, Applied silver nitrate and Prisma AG collagen dressing secured with steristrips and dry sterile dressing to surgical site/wound right foot secured with ACE wrap and stockinet  -Refilled ampicillin and tetracycline for preventive measures  -Advised patient to make sure to keep dressings clean, dry, and intact to right surgical site today, removing the ACE and changing layers of Prisma dressing 2 times a week until seen visit; anticipate continue with prisma until plastics eval -Continue with limited weightbearing to right foot and use of rolling walker -Advised patient to limit activity to necessity  -Continue with PRN and pain meds as needed -Advised patient to ice and elevate as necessary  -Return in 1 week for ulcer care. In the meantime, patient to call office if any issues or problems arise.Awaiting plastic surgery evaluation for consideration of flap or skin grafting procedure to  assist with healing chronic ulceration.   Asencion Islam, DPM

## 2016-02-25 ENCOUNTER — Other Ambulatory Visit: Payer: Self-pay | Admitting: Sports Medicine

## 2016-02-25 DIAGNOSIS — M79671 Pain in right foot: Secondary | ICD-10-CM

## 2016-02-29 ENCOUNTER — Ambulatory Visit (INDEPENDENT_AMBULATORY_CARE_PROVIDER_SITE_OTHER): Payer: Medicare Other | Admitting: Sports Medicine

## 2016-02-29 DIAGNOSIS — L97412 Non-pressure chronic ulcer of right heel and midfoot with fat layer exposed: Secondary | ICD-10-CM

## 2016-02-29 DIAGNOSIS — M79671 Pain in right foot: Secondary | ICD-10-CM

## 2016-02-29 DIAGNOSIS — L011 Impetiginization of other dermatoses: Secondary | ICD-10-CM | POA: Diagnosis not present

## 2016-02-29 DIAGNOSIS — E11621 Type 2 diabetes mellitus with foot ulcer: Secondary | ICD-10-CM | POA: Diagnosis not present

## 2016-02-29 DIAGNOSIS — Z9889 Other specified postprocedural states: Secondary | ICD-10-CM

## 2016-02-29 DIAGNOSIS — E1142 Type 2 diabetes mellitus with diabetic polyneuropathy: Secondary | ICD-10-CM

## 2016-02-29 NOTE — Progress Notes (Signed)
Subjective: Emily Miranda is a 75 y.o. female patient seen today in office for POV #7 (DOS 01-08-16), S/P Right foot wound debridement and placement of stravix allograft. Patient currently applying primsa Ag as instructed; admits to decreased drainage; Taking tetracycline and ampicillin with no problems; denies pain at surgical site, denies calf pain, denies headache, chest pain, shortness of breath, nausea, vomiting, fever, or chills. No other issues noted.   FBS "good" per patient  Patient Active Problem List   Diagnosis Date Noted  . Wound infection 11/13/2015  . Abdominal pain, RLQ (right lower quadrant) -chronic since 2009 06/02/2013  . Constipation, chronic 06/02/2013  . SUI (stress urinary incontinence, female) 06/02/2013  . Recurrent UTI (urinary tract infection) 06/02/2013  . HTN (hypertension)   . CHEST PAIN 11/27/2009  . OVERWEIGHT 11/28/2008  . ESSENTIAL HYPERTENSION, BENIGN 11/28/2008  . DM 11/25/2008  . AORTIC STENOSIS 11/25/2008  . PANCREATITIS, HX OF 11/25/2008    Current Outpatient Prescriptions on File Prior to Visit  Medication Sig Dispense Refill  . amLODipine (NORVASC) 5 MG tablet Take 5 mg by mouth daily.     Marland Kitchen. amoxicillin (AMOXIL) 875 MG tablet     . ampicillin (PRINCIPEN) 500 MG capsule Take 1 capsule (500 mg total) by mouth 4 (four) times daily. 56 capsule 0  . Ascorbic Acid (VITAMIN C) 100 MG tablet Take 100 mg by mouth daily.      . cephALEXin (KEFLEX) 500 MG capsule Take 1 capsule (500 mg total) by mouth 2 (two) times daily. (Patient not taking: Reported on 11/13/2015) 28 capsule 0  . chlorthalidone (HYGROTON) 25 MG tablet Take 0.5 tablets (12.5 mg total) by mouth daily. 15 tablet 11  . diphenoxylate-atropine (LOMOTIL) 2.5-0.025 MG tablet Take 1 tablet by mouth 4 (four) times daily as needed for diarrhea or loose stools. 30 tablet 0  . docusate sodium (COLACE) 100 MG capsule Take 100 mg by mouth 2 (two) times daily. Take one every 8 hours prn constipation.     . ertapenem 1 g in sodium chloride 0.9 % 50 mL Inject 1 g into the vein daily. 13 Syringe 0  . fexofenadine (ALLEGRA) 180 MG tablet Take 180 mg by mouth daily.  5  . fluconazole (DIFLUCAN) 100 MG tablet Take two tablets daily for 14 days. 28 tablet 0  . fluticasone (FLONASE) 50 MCG/ACT nasal spray     . furosemide (LASIX) 20 MG tablet Take 20 mg by mouth daily. Reported on 11/13/2015    . glimepiride (AMARYL) 4 MG tablet Take 4 mg by mouth daily with breakfast.    . HYDROcodone-acetaminophen (NORCO) 10-325 MG tablet Take 1 tablet by mouth every 6 (six) hours as needed.    Marland Kitchen. HYDROcodone-acetaminophen (NORCO) 5-325 MG per tablet Take 1 tablet by mouth every 6 (six) hours as needed. 10 tablet 0  . HYDROcodone-acetaminophen (NORCO/VICODIN) 5-325 MG tablet Take by mouth.    Pincus Sanes. INVANZ 1 g injection     . linagliptin (TRADJENTA) 5 MG TABS tablet Take by mouth.    . linezolid (ZYVOX) 600 MG/300ML IVPB Inject 300 mLs (600 mg total) into the vein every 12 (twelve) hours. (Patient not taking: Reported on 11/13/2015) 100 mL 23  . lisinopril (PRINIVIL,ZESTRIL) 10 MG tablet Take 10 mg by mouth daily.    Marland Kitchen. lisinopril (PRINIVIL,ZESTRIL) 30 MG tablet     . lubiprostone (AMITIZA) 8 MCG capsule TAKE 1 CAPSULE (8 MCG TOTAL) BY MOUTH 2 (TWO) TIMES A DAY WITH MEALS.    . methocarbamol (  ROBAXIN) 500 MG tablet Take 1.5 tablets (750 mg total) by mouth every 8 (eight) hours as needed for muscle spasms (use for muscle cramps/pain). (Patient not taking: Reported on 11/13/2015) 30 tablet 2  . naproxen (NAPROSYN) 500 MG tablet Take 1 tablet (500 mg total) by mouth 2 (two) times daily with a meal. (Patient not taking: Reported on 11/13/2015) 40 tablet 1  . nitrofurantoin (MACRODANTIN) 50 MG capsule     . nitrofurantoin, macrocrystal-monohydrate, (MACROBID) 100 MG capsule Take 100 mg by mouth 2 (two) times daily.     . nitroGLYCERIN (NITRODUR - DOSED IN MG/24 HR) 0.1 mg/hr patch Frequency:UNKNOWN   Dosage:0.0     Instructions:   Note:Dose: .    . omeprazole (PRILOSEC) 20 MG capsule     . omeprazole (PRILOSEC) 20 MG capsule TAKE 1 CAPSULE BY MOUTH IN THE MORNING    . ONE TOUCH ULTRA TEST test strip 1 each by Other route once a week.     Marland Kitchen oxyCODONE-acetaminophen (PERCOCET/ROXICET) 5-325 MG tablet Take 1-2 tablets PO Q4H PRN pain    . Phenyleph-Doxylamine-DM-APAP (ALKA SELTZER PLUS PO) Take 1 tablet by mouth daily as needed (congestion). Reported on 11/13/2015    . pregabalin (LYRICA) 75 MG capsule Take 1 capsule (75 mg total) by mouth 2 (two) times daily. (Patient not taking: Reported on 11/13/2015) 60 capsule 1  . promethazine (PHENERGAN) 25 MG tablet Take 25 mg by mouth every 8 (eight) hours as needed for nausea or vomiting.    . sodium chloride 0.9 % infusion     . sulfamethoxazole-trimethoprim (BACTRIM DS,SEPTRA DS) 800-160 MG tablet Take 1 tablet by mouth 2 (two) times daily. 20 tablet 0  . tetracycline (ACHROMYCIN,SUMYCIN) 500 MG capsule TAKE 1 CAPSULE (500 MG TOTAL) BY MOUTH 2 (TWO) TIMES DAILY. (Patient not taking: Reported on 11/13/2015) 28 capsule 0  . tetracycline (ACHROMYCIN,SUMYCIN) 500 MG capsule Take 1 capsule (500 mg total) by mouth 2 (two) times daily. 28 capsule 0  . TRADJENTA 5 MG TABS tablet Take 5 mg by mouth daily.     . traMADol (ULTRAM) 50 MG tablet TAKE 1 TABLET BY MOUTH EVERY 8 HOURS AS NEEDED 30 tablet 0  . vancomycin (VANCOCIN) 10 G SOLR injection      Current Facility-Administered Medications on File Prior to Visit  Medication Dose Route Frequency Provider Last Rate Last Dose  . triamcinolone acetonide (KENALOG) 10 MG/ML injection 10 mg  10 mg Other Once Asencion Islam, DPM        Allergies  Allergen Reactions  . Levofloxacin Nausea And Vomiting    Other reaction(s): Confusion    Objective: There were no vitals filed for this visit.  General: No acute distress, AAOx3  Right foot: Wound graft site with complete incorporation--ulceration measures 3x4x0.3cm (last measurement same) with  budding hypergranular granular base over the wound margins; wound measures post excision 3x4x0.8cm to capsule, no erythema, no warmth, no active drainage, no signs of infection noted, Capillary fill time <3 seconds in all digits, gross sensation present via light touch to right foot. No pain or crepitation with range of motion right foot.  No pain with calf compression.   Assessment and Plan:  Problem List Items Addressed This Visit    None    Visit Diagnoses    Diabetic ulcer of right midfoot associated with type 2 diabetes mellitus, with fat layer exposed (HCC)    -  Primary   Diabetic polyneuropathy associated with type 2 diabetes mellitus (HCC)  Post-operative state       Right foot pain         -Patient seen and evaluated -Discussed treatment options for chronic nonhealing wound that continues to progress with hyper granular buds exhibiting continued nonhealing and disorganization in the wound bed -After verbal consent consent injected right foot periwound with local anesthetic and betadine prep, sharply excised hypergranular tissue and sent as specimen to [facility] with wound culture of base. There was significant bleeding that was cauterized with lumincane and silver nitrate. Following applied prisma, surgicell and max-sorb pad to wound secured with 4x4, kerlix, coban, ACE wrap and a Jones compression fashion and stockinet, right foot  -Continue with  ampicillin and tetracycline for preventive measures  -Advised patient to make sure to keep dressings clean, dry, and intact to right surgical site today, removing the ACE and changing layers of Prisma dressing 2 times a week until seen visit; anticipate continue with prisma until plastics eval; Awaiting plastic surgery evaluation for consideration of flap or skin grafting procedure to assist with healing chronic ulceration. -Continue with limited weightbearing to right foot and use of rolling walker -Advised patient to limit activity to  necessity  -Continue with PRN and pain meds as needed -Advised patient to ice and elevate as necessary  -Return in 1 week for ulcer care. In the meantime, patient to call office if any issues or problems arise.   Asencion Islam, DPM

## 2016-03-01 ENCOUNTER — Telehealth: Payer: Self-pay | Admitting: Sports Medicine

## 2016-03-01 NOTE — Telephone Encounter (Signed)
Called patient to check on her after in office debridement on yesterday. Patient had a lot of bleeding that was controlled with hemostatic agents and elevation before dressing was placed. I called just to make sure patient was doing ok. There was no answer so left voicemail requesting patient to call if there are any problems.  -Dr Marylene Land

## 2016-03-05 DIAGNOSIS — L97511 Non-pressure chronic ulcer of other part of right foot limited to breakdown of skin: Secondary | ICD-10-CM | POA: Diagnosis not present

## 2016-03-05 DIAGNOSIS — L97512 Non-pressure chronic ulcer of other part of right foot with fat layer exposed: Secondary | ICD-10-CM | POA: Diagnosis not present

## 2016-03-08 ENCOUNTER — Encounter: Payer: Self-pay | Admitting: Sports Medicine

## 2016-03-08 ENCOUNTER — Ambulatory Visit (INDEPENDENT_AMBULATORY_CARE_PROVIDER_SITE_OTHER): Payer: Medicare Other | Admitting: Sports Medicine

## 2016-03-08 DIAGNOSIS — M79671 Pain in right foot: Secondary | ICD-10-CM

## 2016-03-08 DIAGNOSIS — Z9889 Other specified postprocedural states: Secondary | ICD-10-CM

## 2016-03-08 DIAGNOSIS — E1142 Type 2 diabetes mellitus with diabetic polyneuropathy: Secondary | ICD-10-CM

## 2016-03-08 DIAGNOSIS — E11621 Type 2 diabetes mellitus with foot ulcer: Secondary | ICD-10-CM

## 2016-03-08 DIAGNOSIS — L97412 Non-pressure chronic ulcer of right heel and midfoot with fat layer exposed: Secondary | ICD-10-CM

## 2016-03-08 NOTE — Progress Notes (Signed)
Subjective: Emily Miranda is a 75 y.o. female patient seen today in office for POV #8 (DOS 01-08-16), S/P Right foot wound debridement and placement of stravix allograft and status post office excision of ulceration 02/29/2016. Patient currently applying primsa Ag as instructed; admits to decreased drainage; Taking tetracycline and ampicillin with no problems; denies pain at surgical site, denies calf pain, denies headache, chest pain, shortness of breath, nausea, vomiting, fever, or chills. No other issues noted.   FBS "good" per patient  Patient Active Problem List   Diagnosis Date Noted  . Wound infection 11/13/2015  . Abdominal pain, RLQ (right lower quadrant) -chronic since 2009 06/02/2013  . Constipation, chronic 06/02/2013  . SUI (stress urinary incontinence, female) 06/02/2013  . Recurrent UTI (urinary tract infection) 06/02/2013  . HTN (hypertension)   . CHEST PAIN 11/27/2009  . OVERWEIGHT 11/28/2008  . ESSENTIAL HYPERTENSION, BENIGN 11/28/2008  . DM 11/25/2008  . AORTIC STENOSIS 11/25/2008  . PANCREATITIS, HX OF 11/25/2008    Current Outpatient Prescriptions on File Prior to Visit  Medication Sig Dispense Refill  . amLODipine (NORVASC) 5 MG tablet Take 5 mg by mouth daily.     Marland Kitchen amoxicillin (AMOXIL) 875 MG tablet     . Ascorbic Acid (VITAMIN C) 100 MG tablet Take 100 mg by mouth daily.      . cephALEXin (KEFLEX) 500 MG capsule Take 1 capsule (500 mg total) by mouth 2 (two) times daily. (Patient not taking: Reported on 11/13/2015) 28 capsule 0  . chlorthalidone (HYGROTON) 25 MG tablet Take 0.5 tablets (12.5 mg total) by mouth daily. 15 tablet 11  . diphenoxylate-atropine (LOMOTIL) 2.5-0.025 MG tablet Take 1 tablet by mouth 4 (four) times daily as needed for diarrhea or loose stools. 30 tablet 0  . docusate sodium (COLACE) 100 MG capsule Take 100 mg by mouth 2 (two) times daily. Take one every 8 hours prn constipation.    . ertapenem 1 g in sodium chloride 0.9 % 50 mL Inject 1  g into the vein daily. 13 Syringe 0  . fexofenadine (ALLEGRA) 180 MG tablet Take 180 mg by mouth daily.  5  . fluconazole (DIFLUCAN) 100 MG tablet Take two tablets daily for 14 days. 28 tablet 0  . fluticasone (FLONASE) 50 MCG/ACT nasal spray     . furosemide (LASIX) 20 MG tablet Take 20 mg by mouth daily. Reported on 11/13/2015    . glimepiride (AMARYL) 4 MG tablet Take 4 mg by mouth daily with breakfast.    . HYDROcodone-acetaminophen (NORCO) 10-325 MG tablet Take 1 tablet by mouth every 6 (six) hours as needed.    Marland Kitchen HYDROcodone-acetaminophen (NORCO) 5-325 MG per tablet Take 1 tablet by mouth every 6 (six) hours as needed. 10 tablet 0  . HYDROcodone-acetaminophen (NORCO/VICODIN) 5-325 MG tablet Take by mouth.    Pincus Sanes 1 g injection     . linagliptin (TRADJENTA) 5 MG TABS tablet Take by mouth.    . linezolid (ZYVOX) 600 MG/300ML IVPB Inject 300 mLs (600 mg total) into the vein every 12 (twelve) hours. (Patient not taking: Reported on 11/13/2015) 100 mL 23  . lisinopril (PRINIVIL,ZESTRIL) 10 MG tablet Take 10 mg by mouth daily.    Marland Kitchen lisinopril (PRINIVIL,ZESTRIL) 30 MG tablet     . lubiprostone (AMITIZA) 8 MCG capsule TAKE 1 CAPSULE (8 MCG TOTAL) BY MOUTH 2 (TWO) TIMES A DAY WITH MEALS.    . methocarbamol (ROBAXIN) 500 MG tablet Take 1.5 tablets (750 mg total) by mouth every 8 (  eight) hours as needed for muscle spasms (use for muscle cramps/pain). (Patient not taking: Reported on 11/13/2015) 30 tablet 2  . naproxen (NAPROSYN) 500 MG tablet Take 1 tablet (500 mg total) by mouth 2 (two) times daily with a meal. (Patient not taking: Reported on 11/13/2015) 40 tablet 1  . nitrofurantoin (MACRODANTIN) 50 MG capsule     . nitrofurantoin, macrocrystal-monohydrate, (MACROBID) 100 MG capsule Take 100 mg by mouth 2 (two) times daily.     . nitroGLYCERIN (NITRODUR - DOSED IN MG/24 HR) 0.1 mg/hr patch Frequency:UNKNOWN   Dosage:0.0     Instructions:  Note:Dose: .    . omeprazole (PRILOSEC) 20 MG capsule     .  omeprazole (PRILOSEC) 20 MG capsule TAKE 1 CAPSULE BY MOUTH IN THE MORNING    . ONE TOUCH ULTRA TEST test strip 1 each by Other route once a week.     Marland Kitchen oxyCODONE-acetaminophen (PERCOCET/ROXICET) 5-325 MG tablet Take 1-2 tablets PO Q4H PRN pain    . Phenyleph-Doxylamine-DM-APAP (ALKA SELTZER PLUS PO) Take 1 tablet by mouth daily as needed (congestion). Reported on 11/13/2015    . pregabalin (LYRICA) 75 MG capsule Take 1 capsule (75 mg total) by mouth 2 (two) times daily. (Patient not taking: Reported on 11/13/2015) 60 capsule 1  . promethazine (PHENERGAN) 25 MG tablet Take 25 mg by mouth every 8 (eight) hours as needed for nausea or vomiting.    . sodium chloride 0.9 % infusion     . sulfamethoxazole-trimethoprim (BACTRIM DS,SEPTRA DS) 800-160 MG tablet Take 1 tablet by mouth 2 (two) times daily. 20 tablet 0  . tetracycline (ACHROMYCIN,SUMYCIN) 500 MG capsule TAKE 1 CAPSULE (500 MG TOTAL) BY MOUTH 2 (TWO) TIMES DAILY. (Patient not taking: Reported on 11/13/2015) 28 capsule 0  . TRADJENTA 5 MG TABS tablet Take 5 mg by mouth daily.     . traMADol (ULTRAM) 50 MG tablet TAKE 1 TABLET BY MOUTH EVERY 8 HOURS AS NEEDED 30 tablet 0  . vancomycin (VANCOCIN) 10 G SOLR injection      Current Facility-Administered Medications on File Prior to Visit  Medication Dose Route Frequency Provider Last Rate Last Dose  . triamcinolone acetonide (KENALOG) 10 MG/ML injection 10 mg  10 mg Other Once Asencion Islam, DPM        Allergies  Allergen Reactions  . Levofloxacin Nausea And Vomiting    Other reaction(s): Confusion    Objective: There were no vitals filed for this visit.  General: No acute distress, AAOx3  Right foot: Plantar medial ulceration with budding granular base measures post excision 3x3.5x1cm to capsule, no erythema, no warmth, no active drainage, no signs of infection noted, Capillary fill time <3 seconds in all digits, gross sensation present via light touch to right foot. No pain or crepitation  with range of motion right foot.  No pain with calf compression.   Assessment and Plan:  Problem List Items Addressed This Visit    None    Visit Diagnoses    Diabetic ulcer of right midfoot associated with type 2 diabetes mellitus, with fat layer exposed (HCC)    -  Primary   Diabetic polyneuropathy associated with type 2 diabetes mellitus (HCC)       Post-operative state       Right foot pain         -Patient seen and evaluated -Re-Discussed treatment options for chronic nonhealing wound that continues to progress with hyper granular buds exhibiting continued nonhealing and disorganization in the wound bed -  Cleansed and applied silver nitrate to wound base covered with prisma to wound secured with 4x4, kerlix, coban, ACE wrap and stockinet, right foot . Patient to continue with same dressings at home as instructed -Continue with ampicillin and tetracycline for preventive measures until completed - Awaiting plastic surgery evaluation for consideration of flap or skin grafting procedure to assist with healing chronic ulceration. -Continue with limited weightbearing to right foot and use of rolling walker -Advised patient to limit activity to necessity  -Continue with PRN and pain meds as needed -Advised patient to ice and elevate as necessary  -Return in 1 week for ulcer care. In the meantime, patient to call office if any issues or problems arise.   Asencion Islamitorya Gracin [place], DPM

## 2016-03-11 ENCOUNTER — Telehealth: Payer: Self-pay | Admitting: *Deleted

## 2016-03-11 NOTE — Telephone Encounter (Addendum)
-----   Message from Asencion Islam, [place] sent at 03/08/2016  4:36 PM EDT ----- Regarding: [facility] result and Status of Consult to Plastics Hi [name] Can you let the patient know that her tissue that was sent to [facility] is negative for cancer and came back as traumatized tissue/scar and no growth was seen on the swab culture Also please update patient on the status of her consult to plastics; we are still awaiting plastics eval for skin graft or flap. Thanks Dr Marylene Land. 03/11/2016-Emily Miranda - [facility] states pt was a "No Show" on 02/19/2016. Left message for pt to call for results. Home phone is not in service. 03/12/2016-Informed pt of the contact number for Dr. Kittie Plater [facility] - Emily Miranda and the missed appt. [name] states she did not get a call from their office.

## 2016-03-11 NOTE — Telephone Encounter (Signed)
The patient is pretty reliable and usually does not miss appointments; she must have not been aware of the 10/9 appointment. Can you please contact her to let her know. Thanks Dr. Marylene Land

## 2016-03-12 ENCOUNTER — Other Ambulatory Visit: Payer: Self-pay | Admitting: Sports Medicine

## 2016-03-12 DIAGNOSIS — H353131 Nonexudative age-related macular degeneration, bilateral, early dry stage: Secondary | ICD-10-CM | POA: Diagnosis not present

## 2016-03-12 DIAGNOSIS — E119 Type 2 diabetes mellitus without complications: Secondary | ICD-10-CM | POA: Diagnosis not present

## 2016-03-12 DIAGNOSIS — H5203 Hypermetropia, bilateral: Secondary | ICD-10-CM | POA: Diagnosis not present

## 2016-03-12 NOTE — Telephone Encounter (Signed)
Thank you :)

## 2016-03-14 ENCOUNTER — Ambulatory Visit (INDEPENDENT_AMBULATORY_CARE_PROVIDER_SITE_OTHER): Payer: Medicare Other | Admitting: Sports Medicine

## 2016-03-14 ENCOUNTER — Encounter: Payer: Self-pay | Admitting: Sports Medicine

## 2016-03-14 DIAGNOSIS — E11621 Type 2 diabetes mellitus with foot ulcer: Secondary | ICD-10-CM

## 2016-03-14 DIAGNOSIS — M79671 Pain in right foot: Secondary | ICD-10-CM

## 2016-03-14 DIAGNOSIS — L97412 Non-pressure chronic ulcer of right heel and midfoot with fat layer exposed: Secondary | ICD-10-CM

## 2016-03-14 DIAGNOSIS — E1142 Type 2 diabetes mellitus with diabetic polyneuropathy: Secondary | ICD-10-CM

## 2016-03-14 DIAGNOSIS — Z9889 Other specified postprocedural states: Secondary | ICD-10-CM

## 2016-03-14 NOTE — Progress Notes (Signed)
Subjective: Emily Miranda is a 75 y.o. female patient seen today in office for POV #9 (DOS 01-08-16), S/P Right foot wound debridement and placement of stravix allograft and status post, office excision of ulceration 02/29/2016 with pathology revealing traumatized tissue with no bacterial pathogens. Patient currently applying primsa Ag as instructed; admits to decreased drainage; Finished tetracycline and ampicillin with no problems; denies pain at surgical/excision site, denies calf pain, denies headache, chest pain, shortness of breath, nausea, vomiting, fever, or chills. No other issues noted.   FBS "good" per patient  Patient Active Problem List   Diagnosis Date Noted  . Wound infection 11/13/2015  . Abdominal pain, RLQ (right lower quadrant) -chronic since 2009 06/02/2013  . Constipation, chronic 06/02/2013  . SUI (stress urinary incontinence, female) 06/02/2013  . Recurrent UTI (urinary tract infection) 06/02/2013  . HTN (hypertension)   . CHEST PAIN 11/27/2009  . OVERWEIGHT 11/28/2008  . ESSENTIAL HYPERTENSION, BENIGN 11/28/2008  . DM 11/25/2008  . AORTIC STENOSIS 11/25/2008  . PANCREATITIS, HX OF 11/25/2008    Current Outpatient Prescriptions on File Prior to Visit  Medication Sig Dispense Refill  . amLODipine (NORVASC) 5 MG tablet Take 5 mg by mouth daily.     Marland Kitchen. amoxicillin (AMOXIL) 875 MG tablet     . Ascorbic Acid (VITAMIN C) 100 MG tablet Take 100 mg by mouth daily.      . cephALEXin (KEFLEX) 500 MG capsule Take 1 capsule (500 mg total) by mouth 2 (two) times daily. (Patient not taking: Reported on 11/13/2015) 28 capsule 0  . chlorthalidone (HYGROTON) 25 MG tablet Take 0.5 tablets (12.5 mg total) by mouth daily. 15 tablet 11  . diphenoxylate-atropine (LOMOTIL) 2.5-0.025 MG tablet Take 1 tablet by mouth 4 (four) times daily as needed for diarrhea or loose stools. 30 tablet 0  . docusate sodium (COLACE) 100 MG capsule TAKE 1 TABLET BY MOUTH EVERY 8 HOURS, AS NEEDED FOR  CONSTIPATION 30 capsule 0  . ertapenem 1 g in sodium chloride 0.9 % 50 mL Inject 1 g into the vein daily. 13 Syringe 0  . fexofenadine (ALLEGRA) 180 MG tablet Take 180 mg by mouth daily.  5  . fluconazole (DIFLUCAN) 100 MG tablet Take two tablets daily for 14 days. 28 tablet 0  . fluticasone (FLONASE) 50 MCG/ACT nasal spray     . furosemide (LASIX) 20 MG tablet Take 20 mg by mouth daily. Reported on 11/13/2015    . glimepiride (AMARYL) 4 MG tablet Take 4 mg by mouth daily with breakfast.    . HYDROcodone-acetaminophen (NORCO) 10-325 MG tablet Take 1 tablet by mouth every 6 (six) hours as needed.    Marland Kitchen. HYDROcodone-acetaminophen (NORCO) 5-325 MG per tablet Take 1 tablet by mouth every 6 (six) hours as needed. 10 tablet 0  . HYDROcodone-acetaminophen (NORCO/VICODIN) 5-325 MG tablet Take by mouth.    Pincus Sanes. INVANZ 1 g injection     . linagliptin (TRADJENTA) 5 MG TABS tablet Take by mouth.    . linezolid (ZYVOX) 600 MG/300ML IVPB Inject 300 mLs (600 mg total) into the vein every 12 (twelve) hours. (Patient not taking: Reported on 11/13/2015) 100 mL 23  . lisinopril (PRINIVIL,ZESTRIL) 10 MG tablet Take 10 mg by mouth daily.    Marland Kitchen. lisinopril (PRINIVIL,ZESTRIL) 30 MG tablet     . lubiprostone (AMITIZA) 8 MCG capsule TAKE 1 CAPSULE (8 MCG TOTAL) BY MOUTH 2 (TWO) TIMES A DAY WITH MEALS.    . methocarbamol (ROBAXIN) 500 MG tablet Take 1.5 tablets (750  mg total) by mouth every 8 (eight) hours as needed for muscle spasms (use for muscle cramps/pain). (Patient not taking: Reported on 11/13/2015) 30 tablet 2  . naproxen (NAPROSYN) 500 MG tablet Take 1 tablet (500 mg total) by mouth 2 (two) times daily with a meal. (Patient not taking: Reported on 11/13/2015) 40 tablet 1  . nitrofurantoin (MACRODANTIN) 50 MG capsule     . nitrofurantoin, macrocrystal-monohydrate, (MACROBID) 100 MG capsule Take 100 mg by mouth 2 (two) times daily.     . nitroGLYCERIN (NITRODUR - DOSED IN MG/24 HR) 0.1 mg/hr patch Frequency:UNKNOWN   Dosage:0.0      Instructions:  Note:Dose: .    . omeprazole (PRILOSEC) 20 MG capsule     . omeprazole (PRILOSEC) 20 MG capsule TAKE 1 CAPSULE BY MOUTH IN THE MORNING    . ONE TOUCH ULTRA TEST test strip 1 each by Other route once a week.     Marland Kitchen oxyCODONE-acetaminophen (PERCOCET/ROXICET) 5-325 MG tablet Take 1-2 tablets PO Q4H PRN pain    . Phenyleph-Doxylamine-DM-APAP (ALKA SELTZER PLUS PO) Take 1 tablet by mouth daily as needed (congestion). Reported on 11/13/2015    . pregabalin (LYRICA) 75 MG capsule Take 1 capsule (75 mg total) by mouth 2 (two) times daily. (Patient not taking: Reported on 11/13/2015) 60 capsule 1  . promethazine (PHENERGAN) 25 MG tablet Take 25 mg by mouth every 8 (eight) hours as needed for nausea or vomiting.    . sodium chloride 0.9 % infusion     . sulfamethoxazole-trimethoprim (BACTRIM DS,SEPTRA DS) 800-160 MG tablet Take 1 tablet by mouth 2 (two) times daily. 20 tablet 0  . tetracycline (ACHROMYCIN,SUMYCIN) 500 MG capsule TAKE 1 CAPSULE (500 MG TOTAL) BY MOUTH 2 (TWO) TIMES DAILY. (Patient not taking: Reported on 11/13/2015) 28 capsule 0  . TRADJENTA 5 MG TABS tablet Take 5 mg by mouth daily.     . traMADol (ULTRAM) 50 MG tablet TAKE 1 TABLET BY MOUTH EVERY 8 HOURS AS NEEDED 30 tablet 0  . vancomycin (VANCOCIN) 10 G SOLR injection      Current Facility-Administered Medications on File Prior to Visit  Medication Dose Route Frequency Provider Last Rate Last Dose  . triamcinolone acetonide (KENALOG) 10 MG/ML injection 10 mg  10 mg Other Once Asencion Islam, DPM        Allergies  Allergen Reactions  . Levofloxacin Nausea And Vomiting    Other reaction(s): Confusion    Objective: There were no vitals filed for this visit.  General: No acute distress, AAOx3  Right foot: Plantar medial ulceration with budding granular base measures post excision 2.5x3.5x 0.9cm to close to capsule, no erythema, no warmth, no active drainage, no signs of infection noted, Capillary fill time <3 seconds in  all digits, gross sensation present via light touch to right foot. No pain or crepitation with range of motion right foot.  No pain with calf compression.   Assessment and Plan:  Problem List Items Addressed This Visit    None    Visit Diagnoses    Diabetic ulcer of right midfoot associated with type 2 diabetes mellitus, with fat layer exposed (HCC)    -  Primary   Diabetic polyneuropathy associated with type 2 diabetes mellitus (HCC)       Post-operative state       Right foot pain         -Patient seen and evaluated -Pathology reviewed with patient -Re-Discussed treatment options for chronic nonhealing wound that continues to progress  with hyper granular buds exhibiting continued nonhealing and disorganization in the wound bed -Cleansed and applied silver nitrate to wound base covered with prisma to wound secured with 4x4, kerlix, coban, ACE wrap and stockinet, right foot . Patient to continue with same dressings at home as instructed - Awaiting plastic surgery evaluation for consideration of flap or skin grafting procedure to assist with healing chronic ulceration. -Continue with limited weightbearing to right foot heel only and use of rolling walker -Advised patient to limit activity to necessity  -Continue with PRN and pain meds as needed -Advised patient to ice and elevate as necessary  -Return in 1 week for ulcer care. In the meantime, patient to call office if any issues or problems arise.   Asencion Islam, DPM

## 2016-03-15 DIAGNOSIS — N3020 Other chronic cystitis without hematuria: Secondary | ICD-10-CM | POA: Diagnosis not present

## 2016-03-18 DIAGNOSIS — L97511 Non-pressure chronic ulcer of other part of right foot limited to breakdown of skin: Secondary | ICD-10-CM | POA: Diagnosis not present

## 2016-03-18 DIAGNOSIS — L97512 Non-pressure chronic ulcer of other part of right foot with fat layer exposed: Secondary | ICD-10-CM | POA: Diagnosis not present

## 2016-03-21 ENCOUNTER — Ambulatory Visit: Payer: Medicare Other | Admitting: Sports Medicine

## 2016-03-22 ENCOUNTER — Encounter: Payer: Self-pay | Admitting: Sports Medicine

## 2016-03-22 ENCOUNTER — Ambulatory Visit (INDEPENDENT_AMBULATORY_CARE_PROVIDER_SITE_OTHER): Payer: Medicare Other | Admitting: Sports Medicine

## 2016-03-22 DIAGNOSIS — E11621 Type 2 diabetes mellitus with foot ulcer: Secondary | ICD-10-CM

## 2016-03-22 DIAGNOSIS — M79671 Pain in right foot: Secondary | ICD-10-CM

## 2016-03-22 DIAGNOSIS — E1142 Type 2 diabetes mellitus with diabetic polyneuropathy: Secondary | ICD-10-CM

## 2016-03-22 DIAGNOSIS — L97412 Non-pressure chronic ulcer of right heel and midfoot with fat layer exposed: Secondary | ICD-10-CM

## 2016-03-22 DIAGNOSIS — Z9889 Other specified postprocedural states: Secondary | ICD-10-CM

## 2016-03-22 NOTE — Progress Notes (Signed)
Subjective: Emily Miranda is a 75 y.o. female patient seen today in office for POV #10 (DOS 01-08-16), S/P Right foot wound debridement and placement of stravix allograft and status post, office excision of ulceration 02/29/2016 with pathology revealing traumatized tissue with no bacterial pathogens. Patient currently applying primsa Ag as instructed; admits to decreased drainage; denies pain at surgical/excision site, denies calf pain, denies headache, chest pain, shortness of breath, nausea, vomiting, fever, or chills. No other issues noted.   FBS "good" per patient  Patient Active Problem List   Diagnosis Date Noted  . Wound infection 11/13/2015  . Abdominal pain, RLQ (right lower quadrant) -chronic since 2009 06/02/2013  . Constipation, chronic 06/02/2013  . SUI (stress urinary incontinence, female) 06/02/2013  . Recurrent UTI (urinary tract infection) 06/02/2013  . HTN (hypertension)   . CHEST PAIN 11/27/2009  . OVERWEIGHT 11/28/2008  . ESSENTIAL HYPERTENSION, BENIGN 11/28/2008  . DM 11/25/2008  . AORTIC STENOSIS 11/25/2008  . PANCREATITIS, HX OF 11/25/2008    Current Outpatient Prescriptions on File Prior to Visit  Medication Sig Dispense Refill  . amLODipine (NORVASC) 5 MG tablet Take 5 mg by mouth daily.     Marland Kitchen amoxicillin (AMOXIL) 875 MG tablet     . Ascorbic Acid (VITAMIN C) 100 MG tablet Take 100 mg by mouth daily.      . cephALEXin (KEFLEX) 500 MG capsule Take 1 capsule (500 mg total) by mouth 2 (two) times daily. (Patient not taking: Reported on 11/13/2015) 28 capsule 0  . chlorthalidone (HYGROTON) 25 MG tablet Take 0.5 tablets (12.5 mg total) by mouth daily. 15 tablet 11  . diphenoxylate-atropine (LOMOTIL) 2.5-0.025 MG tablet Take 1 tablet by mouth 4 (four) times daily as needed for diarrhea or loose stools. 30 tablet 0  . docusate sodium (COLACE) 100 MG capsule TAKE 1 TABLET BY MOUTH EVERY 8 HOURS, AS NEEDED FOR CONSTIPATION 30 capsule 0  . ertapenem 1 g in sodium  chloride 0.9 % 50 mL Inject 1 g into the vein daily. 13 Syringe 0  . fexofenadine (ALLEGRA) 180 MG tablet Take 180 mg by mouth daily.  5  . fluconazole (DIFLUCAN) 100 MG tablet Take two tablets daily for 14 days. 28 tablet 0  . fluticasone (FLONASE) 50 MCG/ACT nasal spray     . furosemide (LASIX) 20 MG tablet Take 20 mg by mouth daily. Reported on 11/13/2015    . glimepiride (AMARYL) 4 MG tablet Take 4 mg by mouth daily with breakfast.    . HYDROcodone-acetaminophen (NORCO) 10-325 MG tablet Take 1 tablet by mouth every 6 (six) hours as needed.    Marland Kitchen HYDROcodone-acetaminophen (NORCO) 5-325 MG per tablet Take 1 tablet by mouth every 6 (six) hours as needed. 10 tablet 0  . HYDROcodone-acetaminophen (NORCO/VICODIN) 5-325 MG tablet Take by mouth.    Pincus Sanes 1 g injection     . linagliptin (TRADJENTA) 5 MG TABS tablet Take by mouth.    . linezolid (ZYVOX) 600 MG/300ML IVPB Inject 300 mLs (600 mg total) into the vein every 12 (twelve) hours. (Patient not taking: Reported on 11/13/2015) 100 mL 23  . lisinopril (PRINIVIL,ZESTRIL) 10 MG tablet Take 10 mg by mouth daily.    Marland Kitchen lisinopril (PRINIVIL,ZESTRIL) 30 MG tablet     . lubiprostone (AMITIZA) 8 MCG capsule TAKE 1 CAPSULE (8 MCG TOTAL) BY MOUTH 2 (TWO) TIMES A DAY WITH MEALS.    . methocarbamol (ROBAXIN) 500 MG tablet Take 1.5 tablets (750 mg total) by mouth every 8 (eight)  hours as needed for muscle spasms (use for muscle cramps/pain). (Patient not taking: Reported on 11/13/2015) 30 tablet 2  . naproxen (NAPROSYN) 500 MG tablet Take 1 tablet (500 mg total) by mouth 2 (two) times daily with a meal. (Patient not taking: Reported on 11/13/2015) 40 tablet 1  . nitrofurantoin (MACRODANTIN) 50 MG capsule     . nitrofurantoin, macrocrystal-monohydrate, (MACROBID) 100 MG capsule Take 100 mg by mouth 2 (two) times daily.     . nitroGLYCERIN (NITRODUR - DOSED IN MG/24 HR) 0.1 mg/hr patch Frequency:UNKNOWN   Dosage:0.0     Instructions:  Note:Dose: .    . omeprazole  (PRILOSEC) 20 MG capsule     . omeprazole (PRILOSEC) 20 MG capsule TAKE 1 CAPSULE BY MOUTH IN THE MORNING    . ONE TOUCH ULTRA TEST test strip 1 each by Other route once a week.     Marland Kitchen. oxyCODONE-acetaminophen (PERCOCET/ROXICET) 5-325 MG tablet Take 1-2 tablets PO Q4H PRN pain    . Phenyleph-Doxylamine-DM-APAP (ALKA SELTZER PLUS PO) Take 1 tablet by mouth daily as needed (congestion). Reported on 11/13/2015    . pregabalin (LYRICA) 75 MG capsule Take 1 capsule (75 mg total) by mouth 2 (two) times daily. (Patient not taking: Reported on 11/13/2015) 60 capsule 1  . promethazine (PHENERGAN) 25 MG tablet Take 25 mg by mouth every 8 (eight) hours as needed for nausea or vomiting.    . sodium chloride 0.9 % infusion     . sulfamethoxazole-trimethoprim (BACTRIM DS,SEPTRA DS) 800-160 MG tablet Take 1 tablet by mouth 2 (two) times daily. 20 tablet 0  . tetracycline (ACHROMYCIN,SUMYCIN) 500 MG capsule TAKE 1 CAPSULE (500 MG TOTAL) BY MOUTH 2 (TWO) TIMES DAILY. (Patient not taking: Reported on 11/13/2015) 28 capsule 0  . TRADJENTA 5 MG TABS tablet Take 5 mg by mouth daily.     . traMADol (ULTRAM) 50 MG tablet TAKE 1 TABLET BY MOUTH EVERY 8 HOURS AS NEEDED 30 tablet 0  . vancomycin (VANCOCIN) 10 G SOLR injection      Current Facility-Administered Medications on File Prior to Visit  Medication Dose Route Frequency Provider Last Rate Last Dose  . triamcinolone acetonide (KENALOG) 10 MG/ML injection 10 mg  10 mg Other Once Asencion Islamitorya Krishav Mamone, DPM        Allergies  Allergen Reactions  . Levofloxacin Nausea And Vomiting    Other reaction(s): Confusion    Objective: There were no vitals filed for this visit.  General: No acute distress, AAOx3  Right foot: Plantar medial ulceration with budding granular base measures post excision 2.5x3.0x 0.9cm to close to capsule, no erythema, no warmth, no active drainage, no signs of infection noted, Capillary fill time <3 seconds in all digits, gross sensation present via light  touch to right foot. No pain or crepitation with range of motion right foot.  No pain with calf compression.   Assessment and Plan:  Problem List Items Addressed This Visit    None    Visit Diagnoses    Diabetic ulcer of right midfoot associated with type 2 diabetes mellitus, with fat layer exposed (HCC)    -  Primary   Diabetic polyneuropathy associated with type 2 diabetes mellitus (HCC)       Post-operative state       Right foot pain         -Patient seen and evaluated -Re-Discussed treatment options and continued care for chronic nonhealing wound -Cleansed and applied silver nitrate to wound base covered with prisma to  wound secured with 4x4, kerlix, coban, ACE wrap and stockinet, right foot . Patient to continue with same dressings at home as instructed - Awaiting plastic surgery evaluation for consideration of flap or skin grafting procedure to assist with healing chronic ulceration, appt 03-25-16 with Dr. Ulice Bold. -Continue with limited weightbearing to right foot heel only and use of rolling walker -Advised patient to limit activity to necessity  -Continue with PRN and pain meds as needed -Advised patient to ice and elevate as necessary  -Return in 1 week for ulcer care. In the meantime, patient to call office if any issues or problems arise.   Asencion Islam, DPM

## 2016-03-25 DIAGNOSIS — E1165 Type 2 diabetes mellitus with hyperglycemia: Secondary | ICD-10-CM | POA: Diagnosis not present

## 2016-03-25 DIAGNOSIS — S91301A Unspecified open wound, right foot, initial encounter: Secondary | ICD-10-CM | POA: Diagnosis not present

## 2016-03-27 ENCOUNTER — Other Ambulatory Visit: Payer: Self-pay | Admitting: Sports Medicine

## 2016-03-27 DIAGNOSIS — M79671 Pain in right foot: Secondary | ICD-10-CM

## 2016-03-28 ENCOUNTER — Encounter: Payer: Self-pay | Admitting: Sports Medicine

## 2016-03-28 ENCOUNTER — Ambulatory Visit (INDEPENDENT_AMBULATORY_CARE_PROVIDER_SITE_OTHER): Payer: Medicare Other | Admitting: Sports Medicine

## 2016-03-28 DIAGNOSIS — E11621 Type 2 diabetes mellitus with foot ulcer: Secondary | ICD-10-CM

## 2016-03-28 DIAGNOSIS — M79671 Pain in right foot: Secondary | ICD-10-CM

## 2016-03-28 DIAGNOSIS — E1142 Type 2 diabetes mellitus with diabetic polyneuropathy: Secondary | ICD-10-CM

## 2016-03-28 DIAGNOSIS — Z9889 Other specified postprocedural states: Secondary | ICD-10-CM

## 2016-03-28 DIAGNOSIS — L97412 Non-pressure chronic ulcer of right heel and midfoot with fat layer exposed: Secondary | ICD-10-CM

## 2016-03-28 NOTE — Telephone Encounter (Addendum)
Pt is requesting refill of Tramadol. 03/28/2016-Tramadol called to [phone].

## 2016-03-28 NOTE — Progress Notes (Signed)
Subjective: Charmayne SheerBernice C Julia is a 75 y.o. female patient seen today in office for POV #11 (DOS 01-08-16), S/P Right foot wound debridement and placement of stravix allograft and status post, office excision of ulceration 02/29/2016 with pathology revealing traumatized tissue with no bacterial pathogens. Patient currently applying primsa Ag as instructed; admits to decreased drainage; denies pain at surgical/excision site, denies calf pain, denies headache, chest pain, shortness of breath, nausea, vomiting, fever, or chills. No other issues noted.   Patient also saw plastics earlier this week.  FBS "good" per patient  Patient Active Problem List   Diagnosis Date Noted  . Wound infection 11/13/2015  . Abdominal pain, RLQ (right lower quadrant) -chronic since 2009 06/02/2013  . Constipation, chronic 06/02/2013  . SUI (stress urinary incontinence, female) 06/02/2013  . Recurrent UTI (urinary tract infection) 06/02/2013  . HTN (hypertension)   . CHEST PAIN 11/27/2009  . OVERWEIGHT 11/28/2008  . ESSENTIAL HYPERTENSION, BENIGN 11/28/2008  . DM 11/25/2008  . AORTIC STENOSIS 11/25/2008  . PANCREATITIS, HX OF 11/25/2008    Current Outpatient Prescriptions on File Prior to Visit  Medication Sig Dispense Refill  . amLODipine (NORVASC) 5 MG tablet Take 5 mg by mouth daily.     Marland Kitchen. amoxicillin (AMOXIL) 875 MG tablet     . Ascorbic Acid (VITAMIN C) 100 MG tablet Take 100 mg by mouth daily.      . cephALEXin (KEFLEX) 500 MG capsule Take 1 capsule (500 mg total) by mouth 2 (two) times daily. (Patient not taking: Reported on 11/13/2015) 28 capsule 0  . chlorthalidone (HYGROTON) 25 MG tablet Take 0.5 tablets (12.5 mg total) by mouth daily. 15 tablet 11  . diphenoxylate-atropine (LOMOTIL) 2.5-0.025 MG tablet Take 1 tablet by mouth 4 (four) times daily as needed for diarrhea or loose stools. 30 tablet 0  . docusate sodium (COLACE) 100 MG capsule TAKE 1 TABLET BY MOUTH EVERY 8 HOURS, AS NEEDED FOR  CONSTIPATION 30 capsule 0  . ertapenem 1 g in sodium chloride 0.9 % 50 mL Inject 1 g into the vein daily. 13 Syringe 0  . fexofenadine (ALLEGRA) 180 MG tablet Take 180 mg by mouth daily.  5  . fluconazole (DIFLUCAN) 100 MG tablet Take two tablets daily for 14 days. 28 tablet 0  . fluticasone (FLONASE) 50 MCG/ACT nasal spray     . furosemide (LASIX) 20 MG tablet Take 20 mg by mouth daily. Reported on 11/13/2015    . glimepiride (AMARYL) 4 MG tablet Take 4 mg by mouth daily with breakfast.    . HYDROcodone-acetaminophen (NORCO) 10-325 MG tablet Take 1 tablet by mouth every 6 (six) hours as needed.    Marland Kitchen. HYDROcodone-acetaminophen (NORCO) 5-325 MG per tablet Take 1 tablet by mouth every 6 (six) hours as needed. 10 tablet 0  . HYDROcodone-acetaminophen (NORCO/VICODIN) 5-325 MG tablet Take by mouth.    Pincus Sanes. INVANZ 1 g injection     . linagliptin (TRADJENTA) 5 MG TABS tablet Take by mouth.    . linezolid (ZYVOX) 600 MG/300ML IVPB Inject 300 mLs (600 mg total) into the vein every 12 (twelve) hours. (Patient not taking: Reported on 11/13/2015) 100 mL 23  . lisinopril (PRINIVIL,ZESTRIL) 10 MG tablet Take 10 mg by mouth daily.    Marland Kitchen. lisinopril (PRINIVIL,ZESTRIL) 30 MG tablet     . lubiprostone (AMITIZA) 8 MCG capsule TAKE 1 CAPSULE (8 MCG TOTAL) BY MOUTH 2 (TWO) TIMES A DAY WITH MEALS.    . methocarbamol (ROBAXIN) 500 MG tablet Take 1.5 tablets (  750 mg total) by mouth every 8 (eight) hours as needed for muscle spasms (use for muscle cramps/pain). (Patient not taking: Reported on 11/13/2015) 30 tablet 2  . naproxen (NAPROSYN) 500 MG tablet Take 1 tablet (500 mg total) by mouth 2 (two) times daily with a meal. (Patient not taking: Reported on 11/13/2015) 40 tablet 1  . nitrofurantoin (MACRODANTIN) 50 MG capsule     . nitrofurantoin, macrocrystal-monohydrate, (MACROBID) 100 MG capsule Take 100 mg by mouth 2 (two) times daily.     . nitroGLYCERIN (NITRODUR - DOSED IN MG/24 HR) 0.1 mg/hr patch Frequency:UNKNOWN   Dosage:0.0      Instructions:  Note:Dose: .    . omeprazole (PRILOSEC) 20 MG capsule     . omeprazole (PRILOSEC) 20 MG capsule TAKE 1 CAPSULE BY MOUTH IN THE MORNING    . ONE TOUCH ULTRA TEST test strip 1 each by Other route once a week.     Marland Kitchen oxyCODONE-acetaminophen (PERCOCET/ROXICET) 5-325 MG tablet Take 1-2 tablets PO Q4H PRN pain    . Phenyleph-Doxylamine-DM-APAP (ALKA SELTZER PLUS PO) Take 1 tablet by mouth daily as needed (congestion). Reported on 11/13/2015    . pregabalin (LYRICA) 75 MG capsule Take 1 capsule (75 mg total) by mouth 2 (two) times daily. (Patient not taking: Reported on 11/13/2015) 60 capsule 1  . promethazine (PHENERGAN) 25 MG tablet Take 25 mg by mouth every 8 (eight) hours as needed for nausea or vomiting.    . sodium chloride 0.9 % infusion     . sulfamethoxazole-trimethoprim (BACTRIM DS,SEPTRA DS) 800-160 MG tablet Take 1 tablet by mouth 2 (two) times daily. 20 tablet 0  . tetracycline (ACHROMYCIN,SUMYCIN) 500 MG capsule TAKE 1 CAPSULE (500 MG TOTAL) BY MOUTH 2 (TWO) TIMES DAILY. (Patient not taking: Reported on 11/13/2015) 28 capsule 0  . TRADJENTA 5 MG TABS tablet Take 5 mg by mouth daily.     . traMADol (ULTRAM) 50 MG tablet TAKE 1 TABLET BY MOUTH EVERY 8 HOURS AS NEEDED FOR PAIN 30 tablet 0  . vancomycin (VANCOCIN) 10 G SOLR injection      Current Facility-Administered Medications on File Prior to Visit  Medication Dose Route Frequency Provider Last Rate Last Dose  . triamcinolone acetonide (KENALOG) 10 MG/ML injection 10 mg  10 mg Other Once Asencion Islam, DPM        Allergies  Allergen Reactions  . Levofloxacin Nausea And Vomiting    Other reaction(s): Confusion    Objective: There were no vitals filed for this visit.  General: No acute distress, AAOx3  Right foot: Circular Plantar medial ulceration with budding granular base measures post excision 2.8x3.0x 0.9cm to close to capsule, no erythema, no warmth, no active drainage, no signs of infection noted, Capillary fill  time <3 seconds in all digits, gross sensation present via light touch to right foot. No pain or crepitation with range of motion right foot.  No pain with calf compression.   Assessment and Plan:  Problem List Items Addressed This Visit    None    Visit Diagnoses    Diabetic ulcer of right midfoot associated with type 2 diabetes mellitus, with fat layer exposed (HCC)    -  Primary   Diabetic polyneuropathy associated with type 2 diabetes mellitus (HCC)       Post-operative state       Right foot pain         -Patient seen and evaluated -Re-Discussed treatment options and continued care for chronic nonhealing wound -Cleansed  and applied dry guaze to wound secured with 4x4, kerlix, coban, ACE wrap and stockinet, right foot . Patient to continue with same dressings at home as instructed 2x per week - Plastics/ Dr. Ulice Bold recommendations reviewed  -Continue with limited weightbearing to right foot heel only and use of rolling walker -Advised patient to limit activity to necessity  -Continue with PRN and pain meds as needed -Advised patient to ice and elevate as necessary  -Return in 1 week for ulcer care. In the meantime, patient to call office if any issues or problems arise.   Asencion Islam, DPM

## 2016-03-29 DIAGNOSIS — L97512 Non-pressure chronic ulcer of other part of right foot with fat layer exposed: Secondary | ICD-10-CM | POA: Diagnosis not present

## 2016-03-29 DIAGNOSIS — L97511 Non-pressure chronic ulcer of other part of right foot limited to breakdown of skin: Secondary | ICD-10-CM | POA: Diagnosis not present

## 2016-04-03 ENCOUNTER — Encounter: Payer: Self-pay | Admitting: Sports Medicine

## 2016-04-03 ENCOUNTER — Ambulatory Visit (INDEPENDENT_AMBULATORY_CARE_PROVIDER_SITE_OTHER): Payer: Medicare Other | Admitting: Sports Medicine

## 2016-04-03 DIAGNOSIS — M79671 Pain in right foot: Secondary | ICD-10-CM

## 2016-04-03 DIAGNOSIS — L97412 Non-pressure chronic ulcer of right heel and midfoot with fat layer exposed: Secondary | ICD-10-CM

## 2016-04-03 DIAGNOSIS — M779 Enthesopathy, unspecified: Secondary | ICD-10-CM

## 2016-04-03 DIAGNOSIS — E11621 Type 2 diabetes mellitus with foot ulcer: Secondary | ICD-10-CM | POA: Diagnosis not present

## 2016-04-03 DIAGNOSIS — Z9889 Other specified postprocedural states: Secondary | ICD-10-CM

## 2016-04-03 DIAGNOSIS — M14671 Charcot's joint, right ankle and foot: Secondary | ICD-10-CM

## 2016-04-03 DIAGNOSIS — E1142 Type 2 diabetes mellitus with diabetic polyneuropathy: Secondary | ICD-10-CM

## 2016-04-03 MED ORDER — TRIAMCINOLONE ACETONIDE 40 MG/ML IJ SUSP: 20.0000 mg | Freq: Once | INTRAMUSCULAR | Status: DC

## 2016-04-03 MED ORDER — CELECOXIB 400 MG PO CAPS: 400.0000 mg | ORAL_CAPSULE | Freq: Every day | ORAL | 0 refills | Status: DC

## 2016-04-03 NOTE — Progress Notes (Signed)
Subjective: Emily Miranda is a 75 y.o. female patient seen today in office for POV #12 (DOS 01-08-16), S/P Right foot wound debridement and placement of stravix allograft and status post, office excision of ulceration 02/29/2016 with pathology revealing traumatized tissue with no bacterial pathogens. Patient currently applying primsa Ag as instructed; admits to decreased drainage; denies pain at surgical/excision site, denies calf pain, denies headache, chest pain, shortness of breath, nausea, vomiting, fever, or chills. Admits to new pain across the top of her Right ankle with pain in both legs states feels like its getting worse. No other issues noted.   Patient also states that she got a phone call about surgery with Dr. Ulice Boldillingham and she does not want it done if its going to be the same especially around the holiday time.  FBS "good" per patient  Patient Active Problem List   Diagnosis Date Noted  . Wound infection 11/13/2015  . Abdominal pain, RLQ (right lower quadrant) -chronic since 2009 06/02/2013  . Constipation, chronic 06/02/2013  . SUI (stress urinary incontinence, female) 06/02/2013  . Recurrent UTI (urinary tract infection) 06/02/2013  . HTN (hypertension)   . CHEST PAIN 11/27/2009  . OVERWEIGHT 11/28/2008  . ESSENTIAL HYPERTENSION, BENIGN 11/28/2008  . DM 11/25/2008  . AORTIC STENOSIS 11/25/2008  . PANCREATITIS, HX OF 11/25/2008    Current Outpatient Prescriptions on File Prior to Visit  Medication Sig Dispense Refill  . amLODipine (NORVASC) 5 MG tablet Take 5 mg by mouth daily.     Marland Kitchen. amoxicillin (AMOXIL) 875 MG tablet     . Ascorbic Acid (VITAMIN C) 100 MG tablet Take 100 mg by mouth daily.      . cephALEXin (KEFLEX) 500 MG capsule Take 1 capsule (500 mg total) by mouth 2 (two) times daily. (Patient not taking: Reported on 11/13/2015) 28 capsule 0  . chlorthalidone (HYGROTON) 25 MG tablet Take 0.5 tablets (12.5 mg total) by mouth daily. 15 tablet 11  .  diphenoxylate-atropine (LOMOTIL) 2.5-0.025 MG tablet Take 1 tablet by mouth 4 (four) times daily as needed for diarrhea or loose stools. 30 tablet 0  . docusate sodium (COLACE) 100 MG capsule TAKE 1 TABLET BY MOUTH EVERY 8 HOURS, AS NEEDED FOR CONSTIPATION 30 capsule 0  . ertapenem 1 g in sodium chloride 0.9 % 50 mL Inject 1 g into the vein daily. 13 Syringe 0  . fexofenadine (ALLEGRA) 180 MG tablet Take 180 mg by mouth daily.  5  . fluconazole (DIFLUCAN) 100 MG tablet Take two tablets daily for 14 days. 28 tablet 0  . fluticasone (FLONASE) 50 MCG/ACT nasal spray     . furosemide (LASIX) 20 MG tablet Take 20 mg by mouth daily. Reported on 11/13/2015    . glimepiride (AMARYL) 4 MG tablet Take 4 mg by mouth daily with breakfast.    . HYDROcodone-acetaminophen (NORCO) 10-325 MG tablet Take 1 tablet by mouth every 6 (six) hours as needed.    Marland Kitchen. HYDROcodone-acetaminophen (NORCO) 5-325 MG per tablet Take 1 tablet by mouth every 6 (six) hours as needed. 10 tablet 0  . HYDROcodone-acetaminophen (NORCO/VICODIN) 5-325 MG tablet Take by mouth.    Pincus Sanes. INVANZ 1 g injection     . linagliptin (TRADJENTA) 5 MG TABS tablet Take by mouth.    . linezolid (ZYVOX) 600 MG/300ML IVPB Inject 300 mLs (600 mg total) into the vein every 12 (twelve) hours. (Patient not taking: Reported on 11/13/2015) 100 mL 23  . lisinopril (PRINIVIL,ZESTRIL) 10 MG tablet Take 10 mg by  mouth daily.    Marland Kitchen lisinopril (PRINIVIL,ZESTRIL) 30 MG tablet     . lubiprostone (AMITIZA) 8 MCG capsule TAKE 1 CAPSULE (8 MCG TOTAL) BY MOUTH 2 (TWO) TIMES A DAY WITH MEALS.    . methocarbamol (ROBAXIN) 500 MG tablet Take 1.5 tablets (750 mg total) by mouth every 8 (eight) hours as needed for muscle spasms (use for muscle cramps/pain). (Patient not taking: Reported on 11/13/2015) 30 tablet 2  . naproxen (NAPROSYN) 500 MG tablet Take 1 tablet (500 mg total) by mouth 2 (two) times daily with a meal. (Patient not taking: Reported on 11/13/2015) 40 tablet 1  . nitrofurantoin  (MACRODANTIN) 50 MG capsule     . nitrofurantoin, macrocrystal-monohydrate, (MACROBID) 100 MG capsule Take 100 mg by mouth 2 (two) times daily.     . nitroGLYCERIN (NITRODUR - DOSED IN MG/24 HR) 0.1 mg/hr patch Frequency:UNKNOWN   Dosage:0.0     Instructions:  Note:Dose: .    . omeprazole (PRILOSEC) 20 MG capsule     . omeprazole (PRILOSEC) 20 MG capsule TAKE 1 CAPSULE BY MOUTH IN THE MORNING    . ONE TOUCH ULTRA TEST test strip 1 each by Other route once a week.     Marland Kitchen oxyCODONE-acetaminophen (PERCOCET/ROXICET) 5-325 MG tablet Take 1-2 tablets PO Q4H PRN pain    . Phenyleph-Doxylamine-DM-APAP (ALKA SELTZER PLUS PO) Take 1 tablet by mouth daily as needed (congestion). Reported on 11/13/2015    . pregabalin (LYRICA) 75 MG capsule Take 1 capsule (75 mg total) by mouth 2 (two) times daily. (Patient not taking: Reported on 11/13/2015) 60 capsule 1  . promethazine (PHENERGAN) 25 MG tablet Take 25 mg by mouth every 8 (eight) hours as needed for nausea or vomiting.    . sodium chloride 0.9 % infusion     . sulfamethoxazole-trimethoprim (BACTRIM DS,SEPTRA DS) 800-160 MG tablet Take 1 tablet by mouth 2 (two) times daily. 20 tablet 0  . tetracycline (ACHROMYCIN,SUMYCIN) 500 MG capsule TAKE 1 CAPSULE (500 MG TOTAL) BY MOUTH 2 (TWO) TIMES DAILY. (Patient not taking: Reported on 11/13/2015) 28 capsule 0  . TRADJENTA 5 MG TABS tablet Take 5 mg by mouth daily.     . traMADol (ULTRAM) 50 MG tablet TAKE 1 TABLET BY MOUTH EVERY 8 HOURS AS NEEDED FOR PAIN 30 tablet 0  . vancomycin (VANCOCIN) 10 G SOLR injection      Current Facility-Administered Medications on File Prior to Visit  Medication Dose Route Frequency Provider Last Rate Last Dose  . triamcinolone acetonide (KENALOG) 10 MG/ML injection 10 mg  10 mg Other Once Asencion Islam, DPM        Allergies  Allergen Reactions  . Levofloxacin Nausea And Vomiting    Other reaction(s): Confusion    Objective: There were no vitals filed for this visit.  General: No  acute distress, AAOx3  Right foot: Circular Plantar medial ulceration with budding granular base measures post excision 3x3.0x 0.9cm to close to capsule, no erythema, no warmth, no active drainage, no signs of infection noted, Capillary fill time <3 seconds in all digits, gross sensation present via light touch to right foot. No pain or crepitation with range of motion right foot however at right ankle. There is some pain and tenderness at the anterior medial aspect, likely suggestive of capsulitis and inflammation.  No pain with calf compression.   Assessment and Plan:  Problem List Items Addressed This Visit    None    Visit Diagnoses    Diabetic ulcer of right  midfoot associated with type 2 diabetes mellitus, with fat layer exposed (HCC)    -  Primary   Diabetic polyneuropathy associated with type 2 diabetes mellitus (HCC)       Post-operative state       Right foot pain       Charcot ankle, right       Capsulitis         -Patient seen and evaluated -For new right ankle pain, injected for symptomatic relief 1 mL of Marcaine and lidocaine plain and 0.5 mL of dexamethasone phosphate and Kenalog 40 without complication. Advised patient to make sure she ices tonight and also prescribed Celebrex for chronic inflammation -Also ordered ABIs, PVRs the setting of chronic ulceration -Re-Discussed treatment options and continued care for chronic nonhealing wound -Cleansed and applied Prisma to wound secured with 4x4, kerlix, coban, ACE wrap and stockinet, right foot . Patient to continue with same dressings at home as instructed. - Informed Dr. Hester Mates Patient decision to cancel 04/25/2016 surgery -I also discussed with patient a different grafting technique of Integra flowable matrix. Patient states that she is willing to have this procedure done by me since she is more comfortable with my care and that she would like to wait after the holiday season to allow me to debrided the wound to place this  graft matrix. I provided patient with information on this and reminded her of the postoperative care that will be required -Continue with limited weightbearing to right heel only and use of rolling walker -Advised patient to limit activity to necessity  -Continue with PRN and pain meds as needed -Return in 1 week for ulcer care. In the meantime, patient to call office if any issues or problems arise.   Asencion Islam, DPM

## 2016-04-08 ENCOUNTER — Telehealth: Payer: Self-pay | Admitting: *Deleted

## 2016-04-08 DIAGNOSIS — R531 Weakness: Secondary | ICD-10-CM

## 2016-04-08 DIAGNOSIS — L97412 Non-pressure chronic ulcer of right heel and midfoot with fat layer exposed: Principal | ICD-10-CM

## 2016-04-08 DIAGNOSIS — E11621 Type 2 diabetes mellitus with foot ulcer: Secondary | ICD-10-CM

## 2016-04-08 NOTE — Telephone Encounter (Addendum)
-----   Message from Asencion Islam, [place] sent at 04/03/2016  5:15 PM EST ----- Regarding: ABI/PVRs Chronic non healing diabetic ulcer right foot ABI/PVRs bilateral  Thanks Dr. Marylene Land. 04/08/2016-Orders faxed to [facility].

## 2016-04-11 ENCOUNTER — Ambulatory Visit: Payer: Medicare Other | Admitting: Sports Medicine

## 2016-04-12 DIAGNOSIS — L97512 Non-pressure chronic ulcer of other part of right foot with fat layer exposed: Secondary | ICD-10-CM | POA: Diagnosis not present

## 2016-04-12 DIAGNOSIS — L97511 Non-pressure chronic ulcer of other part of right foot limited to breakdown of skin: Secondary | ICD-10-CM | POA: Diagnosis not present

## 2016-04-13 ENCOUNTER — Other Ambulatory Visit: Payer: Self-pay | Admitting: Sports Medicine

## 2016-04-18 ENCOUNTER — Ambulatory Visit (INDEPENDENT_AMBULATORY_CARE_PROVIDER_SITE_OTHER): Payer: Medicare Other | Admitting: Sports Medicine

## 2016-04-18 ENCOUNTER — Encounter: Payer: Self-pay | Admitting: Sports Medicine

## 2016-04-18 DIAGNOSIS — L97412 Non-pressure chronic ulcer of right heel and midfoot with fat layer exposed: Secondary | ICD-10-CM | POA: Diagnosis not present

## 2016-04-18 DIAGNOSIS — M79671 Pain in right foot: Secondary | ICD-10-CM

## 2016-04-18 DIAGNOSIS — E11621 Type 2 diabetes mellitus with foot ulcer: Secondary | ICD-10-CM | POA: Diagnosis not present

## 2016-04-18 DIAGNOSIS — E1142 Type 2 diabetes mellitus with diabetic polyneuropathy: Secondary | ICD-10-CM

## 2016-04-18 DIAGNOSIS — Z9889 Other specified postprocedural states: Secondary | ICD-10-CM

## 2016-04-18 DIAGNOSIS — M14671 Charcot's joint, right ankle and foot: Secondary | ICD-10-CM

## 2016-04-18 NOTE — Progress Notes (Signed)
Subjective: Emily Miranda is a 75 y.o. female patient seen today in office for POV #13 (DOS 01-08-16), S/P Right foot wound debridement and placement of stravix allograft and status post, office excision of ulceration 02/29/2016 with pathology revealing traumatized tissue with no bacterial pathogens. Patient currently applying primsa Ag as instructed 2x per week; admits to increased clear drainage; denies pain at surgical/excision site, denies calf pain, denies headache, chest pain, shortness of breath, nausea, vomiting, fever, or chills. Admits to weakness and both legs states feels like its getting worse. No other issues noted.   FBS "good" per patient  Patient Active Problem List   Diagnosis Date Noted  . Wound infection 11/13/2015  . Abdominal pain, RLQ (right lower quadrant) -chronic since 2009 06/02/2013  . Constipation, chronic 06/02/2013  . SUI (stress urinary incontinence, female) 06/02/2013  . Recurrent UTI (urinary tract infection) 06/02/2013  . HTN (hypertension)   . CHEST PAIN 11/27/2009  . OVERWEIGHT 11/28/2008  . ESSENTIAL HYPERTENSION, BENIGN 11/28/2008  . DM 11/25/2008  . AORTIC STENOSIS 11/25/2008  . PANCREATITIS, HX OF 11/25/2008    Current Outpatient Prescriptions on File Prior to Visit  Medication Sig Dispense Refill  . amLODipine (NORVASC) 5 MG tablet Take 5 mg by mouth daily.     Marland Kitchen. amoxicillin (AMOXIL) 875 MG tablet     . Ascorbic Acid (VITAMIN C) 100 MG tablet Take 100 mg by mouth daily.      . celecoxib (CELEBREX) 400 MG capsule Take 1 capsule (400 mg total) by mouth daily after breakfast. 30 capsule 0  . cephALEXin (KEFLEX) 500 MG capsule Take 1 capsule (500 mg total) by mouth 2 (two) times daily. (Patient not taking: Reported on 11/13/2015) 28 capsule 0  . chlorthalidone (HYGROTON) 25 MG tablet Take 0.5 tablets (12.5 mg total) by mouth daily. 15 tablet 11  . diphenoxylate-atropine (LOMOTIL) 2.5-0.025 MG tablet Take 1 tablet by mouth 4 (four) times daily as  needed for diarrhea or loose stools. 30 tablet 0  . docusate sodium (COLACE) 100 MG capsule TAKE 1 TABLET BY MOUTH EVERY 8 HOURS, AS NEEDED FOR CONSTIPATION 30 capsule 0  . ertapenem 1 g in sodium chloride 0.9 % 50 mL Inject 1 g into the vein daily. 13 Syringe 0  . fexofenadine (ALLEGRA) 180 MG tablet Take 180 mg by mouth daily.  5  . fluconazole (DIFLUCAN) 100 MG tablet Take two tablets daily for 14 days. 28 tablet 0  . fluticasone (FLONASE) 50 MCG/ACT nasal spray     . furosemide (LASIX) 20 MG tablet Take 20 mg by mouth daily. Reported on 11/13/2015    . glimepiride (AMARYL) 4 MG tablet Take 4 mg by mouth daily with breakfast.    . HYDROcodone-acetaminophen (NORCO) 10-325 MG tablet Take 1 tablet by mouth every 6 (six) hours as needed.    Marland Kitchen. HYDROcodone-acetaminophen (NORCO) 5-325 MG per tablet Take 1 tablet by mouth every 6 (six) hours as needed. 10 tablet 0  . HYDROcodone-acetaminophen (NORCO/VICODIN) 5-325 MG tablet Take by mouth.    Pincus Sanes. INVANZ 1 g injection     . linagliptin (TRADJENTA) 5 MG TABS tablet Take by mouth.    . linezolid (ZYVOX) 600 MG/300ML IVPB Inject 300 mLs (600 mg total) into the vein every 12 (twelve) hours. (Patient not taking: Reported on 11/13/2015) 100 mL 23  . lisinopril (PRINIVIL,ZESTRIL) 10 MG tablet Take 10 mg by mouth daily.    Marland Kitchen. lisinopril (PRINIVIL,ZESTRIL) 30 MG tablet     . lubiprostone (AMITIZA) 8  MCG capsule TAKE 1 CAPSULE (8 MCG TOTAL) BY MOUTH 2 (TWO) TIMES A DAY WITH MEALS.    . methocarbamol (ROBAXIN) 500 MG tablet Take 1.5 tablets (750 mg total) by mouth every 8 (eight) hours as needed for muscle spasms (use for muscle cramps/pain). (Patient not taking: Reported on 11/13/2015) 30 tablet 2  . naproxen (NAPROSYN) 500 MG tablet Take 1 tablet (500 mg total) by mouth 2 (two) times daily with a meal. (Patient not taking: Reported on 11/13/2015) 40 tablet 1  . nitrofurantoin (MACRODANTIN) 50 MG capsule     . nitrofurantoin, macrocrystal-monohydrate, (MACROBID) 100 MG  capsule Take 100 mg by mouth 2 (two) times daily.     . nitroGLYCERIN (NITRODUR - DOSED IN MG/24 HR) 0.1 mg/hr patch Frequency:UNKNOWN   Dosage:0.0     Instructions:  Note:Dose: .    . omeprazole (PRILOSEC) 20 MG capsule     . omeprazole (PRILOSEC) 20 MG capsule TAKE 1 CAPSULE BY MOUTH IN THE MORNING    . ONE TOUCH ULTRA TEST test strip 1 each by Other route once a week.     Marland Kitchen oxyCODONE-acetaminophen (PERCOCET/ROXICET) 5-325 MG tablet Take 1-2 tablets PO Q4H PRN pain    . Phenyleph-Doxylamine-DM-APAP (ALKA SELTZER PLUS PO) Take 1 tablet by mouth daily as needed (congestion). Reported on 11/13/2015    . pregabalin (LYRICA) 75 MG capsule Take 1 capsule (75 mg total) by mouth 2 (two) times daily. (Patient not taking: Reported on 11/13/2015) 60 capsule 1  . promethazine (PHENERGAN) 25 MG tablet Take 25 mg by mouth every 8 (eight) hours as needed for nausea or vomiting.    . sodium chloride 0.9 % infusion     . sulfamethoxazole-trimethoprim (BACTRIM DS,SEPTRA DS) 800-160 MG tablet Take 1 tablet by mouth 2 (two) times daily. 20 tablet 0  . tetracycline (ACHROMYCIN,SUMYCIN) 500 MG capsule TAKE 1 CAPSULE (500 MG TOTAL) BY MOUTH 2 (TWO) TIMES DAILY. (Patient not taking: Reported on 11/13/2015) 28 capsule 0  . TRADJENTA 5 MG TABS tablet Take 5 mg by mouth daily.     . traMADol (ULTRAM) 50 MG tablet TAKE 1 TABLET BY MOUTH EVERY 8 HOURS AS NEEDED FOR PAIN 30 tablet 0  . vancomycin (VANCOCIN) 10 G SOLR injection      Current Facility-Administered Medications on File Prior to Visit  Medication Dose Route Frequency Provider Last Rate Last Dose  . triamcinolone acetonide (KENALOG) 10 MG/ML injection 10 mg  10 mg Other Once [employer], DPM      . triamcinolone acetonide (KENALOG-40) injection 20 mg  20 mg Other Once Asencion Islam, DPM        Allergies  Allergen Reactions  . Levofloxacin Nausea And Vomiting    Other reaction(s): Confusion    Objective: There were no vitals filed for this visit.  General:  No acute distress, AAOx3  Right foot: Circular Plantar medial ulceration with budding granular base measures post excision 4x2.5x 0.3cm no longer capsule exposure completely filling in with clear serous drainage, no erythema, no warmth, no active drainage, no signs of infection noted, Capillary fill time <3 seconds in all digits, gross sensation present via light touch to right foot. No pain or crepitation with range of motion right foot. Subjective weakness in legs and recent history of pancreatitis flare with rectal bleeding.  No pain with calf compression.   Assessment and Plan:  Problem List Items Addressed This Visit    None    Visit Diagnoses    Diabetic ulcer of right  midfoot associated with type 2 diabetes mellitus, with fat layer exposed (HCC)    -  Primary   Diabetic polyneuropathy associated with type 2 diabetes mellitus (HCC)       Post-operative state       Right foot pain       Charcot ankle, right         -Patient seen and evaluated -Continue with Celebrex for chronic inflammation -Awaiting ABIs, PVRs the setting of chronic ulceration -Re-Discussed treatment options and continued care for chronic nonhealing wound -Cleansed and debrided Wound margins to healthy bleeding and applied Oasis tri-layer matrix. 3 x 7 cm, lot number [id number] in total to Ulceration secured with dressings and covered with Prisma and sterile dressings with Ace wrap and stockinet, right foot . Patient to continue with same outer dressing changes of Prisma 3 times per week -Patient to also undergo repeat the wound debridement surgery with use of Integra flowable wound matrix and Integra graft, after the holiday season. Patient to sign consent and to get history and physical. I also advised patient to follow up with her primary doctor in reference to GI bleeding and possible evaluation for infusion if hemoglobin and hematocrit is severely low/ critical. I advised patient that this could be contributing to the  fatigue and weakness that she overall feels especially in her legs, If her red blood cell count is low; patient expressed understanding and states that she will follow up -Continue with limited weightbearing to right heel only for transfers and use of rolling walker -Advised patient to limit activity to necessity  -Continue with PRN and pain meds as needed -Return in 1 week for ulcer care. In the meantime, patient to call office if any issues or problems arise.   Asencion Islam, DPM

## 2016-04-25 ENCOUNTER — Encounter (HOSPITAL_BASED_OUTPATIENT_CLINIC_OR_DEPARTMENT_OTHER): Payer: Self-pay

## 2016-04-25 ENCOUNTER — Ambulatory Visit: Payer: Medicare Other | Admitting: Sports Medicine

## 2016-04-25 ENCOUNTER — Ambulatory Visit (HOSPITAL_BASED_OUTPATIENT_CLINIC_OR_DEPARTMENT_OTHER): Admit: 2016-04-25 | Payer: Medicare Other | Admitting: Plastic Surgery

## 2016-04-25 SURGERY — IRRIGATION AND DEBRIDEMENT EXTREMITY
Anesthesia: General | Laterality: Right

## 2016-05-02 ENCOUNTER — Ambulatory Visit (INDEPENDENT_AMBULATORY_CARE_PROVIDER_SITE_OTHER): Payer: Medicare Other

## 2016-05-02 ENCOUNTER — Encounter: Payer: Self-pay | Admitting: Sports Medicine

## 2016-05-02 ENCOUNTER — Ambulatory Visit (INDEPENDENT_AMBULATORY_CARE_PROVIDER_SITE_OTHER): Payer: Medicare Other | Admitting: Sports Medicine

## 2016-05-02 DIAGNOSIS — M79671 Pain in right foot: Secondary | ICD-10-CM

## 2016-05-02 DIAGNOSIS — M14671 Charcot's joint, right ankle and foot: Secondary | ICD-10-CM

## 2016-05-02 DIAGNOSIS — Z9889 Other specified postprocedural states: Secondary | ICD-10-CM | POA: Diagnosis not present

## 2016-05-02 DIAGNOSIS — E11621 Type 2 diabetes mellitus with foot ulcer: Secondary | ICD-10-CM

## 2016-05-02 DIAGNOSIS — R29898 Other symptoms and signs involving the musculoskeletal system: Secondary | ICD-10-CM

## 2016-05-02 DIAGNOSIS — E1142 Type 2 diabetes mellitus with diabetic polyneuropathy: Secondary | ICD-10-CM

## 2016-05-02 DIAGNOSIS — L97511 Non-pressure chronic ulcer of other part of right foot limited to breakdown of skin: Secondary | ICD-10-CM | POA: Diagnosis not present

## 2016-05-02 DIAGNOSIS — L97412 Non-pressure chronic ulcer of right heel and midfoot with fat layer exposed: Secondary | ICD-10-CM

## 2016-05-02 DIAGNOSIS — L97512 Non-pressure chronic ulcer of other part of right foot with fat layer exposed: Secondary | ICD-10-CM | POA: Diagnosis not present

## 2016-05-02 NOTE — Telephone Encounter (Addendum)
-----   Message from Asencion Islam, [place] sent at 05/02/2016  2:09 PM EST ----- Regarding: Home PT/OT Progressive lower extremity weakness with pain in legs with instability Please Rx home PT/OT Thanks Dr. Marylene Land. 05/02/2016-Faxed orders for PT/OT for weakness with pain in legs, and instability.

## 2016-05-02 NOTE — Progress Notes (Signed)
Subjective: Emily Miranda is a 75 y.o. female patient seen today in office for POV #14 (DOS 01-08-16), S/P Right foot wound debridement and placement of stravix allograft and status post, office excision of ulceration 02/29/2016 with pathology revealing traumatized tissue with no bacterial pathogens. Patient is status post 1 application of Oasis and is currently applying primsa Ag as instructed 3x per week; admits to clear drainage same as previous; denies pain at surgical/excision site, denies calf pain, denies headache, chest pain, shortness of breath, nausea, vomiting, fever, or chills. Admits to weakness and both legs states feels like its getting worse and new pain to the lateral side of the right foot. No other issues noted.   FBS "good" per patient  Patient Active Problem List   Diagnosis Date Noted  . Wound infection 11/13/2015  . Abdominal pain, RLQ (right lower quadrant) -chronic since 2009 06/02/2013  . Constipation, chronic 06/02/2013  . SUI (stress urinary incontinence, female) 06/02/2013  . Recurrent UTI (urinary tract infection) 06/02/2013  . HTN (hypertension)   . CHEST PAIN 11/27/2009  . OVERWEIGHT 11/28/2008  . ESSENTIAL HYPERTENSION, BENIGN 11/28/2008  . DM 11/25/2008  . AORTIC STENOSIS 11/25/2008  . PANCREATITIS, HX OF 11/25/2008    Current Outpatient Prescriptions on File Prior to Visit  Medication Sig Dispense Refill  . amLODipine (NORVASC) 5 MG tablet Take 5 mg by mouth daily.     Marland Kitchen amoxicillin (AMOXIL) 875 MG tablet     . Ascorbic Acid (VITAMIN C) 100 MG tablet Take 100 mg by mouth daily.      . celecoxib (CELEBREX) 400 MG capsule Take 1 capsule (400 mg total) by mouth daily after breakfast. 30 capsule 0  . cephALEXin (KEFLEX) 500 MG capsule Take 1 capsule (500 mg total) by mouth 2 (two) times daily. (Patient not taking: Reported on 11/13/2015) 28 capsule 0  . chlorthalidone (HYGROTON) 25 MG tablet Take 0.5 tablets (12.5 mg total) by mouth daily. 15 tablet 11   . diphenoxylate-atropine (LOMOTIL) 2.5-0.025 MG tablet Take 1 tablet by mouth 4 (four) times daily as needed for diarrhea or loose stools. 30 tablet 0  . docusate sodium (COLACE) 100 MG capsule TAKE 1 TABLET BY MOUTH EVERY 8 HOURS, AS NEEDED FOR CONSTIPATION 30 capsule 0  . ertapenem 1 g in sodium chloride 0.9 % 50 mL Inject 1 g into the vein daily. 13 Syringe 0  . fexofenadine (ALLEGRA) 180 MG tablet Take 180 mg by mouth daily.  5  . fluconazole (DIFLUCAN) 100 MG tablet Take two tablets daily for 14 days. 28 tablet 0  . fluticasone (FLONASE) 50 MCG/ACT nasal spray     . furosemide (LASIX) 20 MG tablet Take 20 mg by mouth daily. Reported on 11/13/2015    . glimepiride (AMARYL) 4 MG tablet Take 4 mg by mouth daily with breakfast.    . HYDROcodone-acetaminophen (NORCO) 10-325 MG tablet Take 1 tablet by mouth every 6 (six) hours as needed.    Marland Kitchen HYDROcodone-acetaminophen (NORCO) 5-325 MG per tablet Take 1 tablet by mouth every 6 (six) hours as needed. 10 tablet 0  . HYDROcodone-acetaminophen (NORCO/VICODIN) 5-325 MG tablet Take by mouth.    Pincus Sanes 1 g injection     . linagliptin (TRADJENTA) 5 MG TABS tablet Take by mouth.    . linezolid (ZYVOX) 600 MG/300ML IVPB Inject 300 mLs (600 mg total) into the vein every 12 (twelve) hours. (Patient not taking: Reported on 11/13/2015) 100 mL 23  . lisinopril (PRINIVIL,ZESTRIL) 10 MG tablet Take  10 mg by mouth daily.    Marland Kitchen. lisinopril (PRINIVIL,ZESTRIL) 30 MG tablet     . lubiprostone (AMITIZA) 8 MCG capsule TAKE 1 CAPSULE (8 MCG TOTAL) BY MOUTH 2 (TWO) TIMES A DAY WITH MEALS.    . methocarbamol (ROBAXIN) 500 MG tablet Take 1.5 tablets (750 mg total) by mouth every 8 (eight) hours as needed for muscle spasms (use for muscle cramps/pain). (Patient not taking: Reported on 11/13/2015) 30 tablet 2  . naproxen (NAPROSYN) 500 MG tablet Take 1 tablet (500 mg total) by mouth 2 (two) times daily with a meal. (Patient not taking: Reported on 11/13/2015) 40 tablet 1  .  nitrofurantoin (MACRODANTIN) 50 MG capsule     . nitrofurantoin, macrocrystal-monohydrate, (MACROBID) 100 MG capsule Take 100 mg by mouth 2 (two) times daily.     . nitroGLYCERIN (NITRODUR - DOSED IN MG/24 HR) 0.1 mg/hr patch Frequency:UNKNOWN   Dosage:0.0     Instructions:  Note:Dose: .    . omeprazole (PRILOSEC) 20 MG capsule     . omeprazole (PRILOSEC) 20 MG capsule TAKE 1 CAPSULE BY MOUTH IN THE MORNING    . ONE TOUCH ULTRA TEST test strip 1 each by Other route once a week.     Marland Kitchen. oxyCODONE-acetaminophen (PERCOCET/ROXICET) 5-325 MG tablet Take 1-2 tablets PO Q4H PRN pain    . Phenyleph-Doxylamine-DM-APAP (ALKA SELTZER PLUS PO) Take 1 tablet by mouth daily as needed (congestion). Reported on 11/13/2015    . pregabalin (LYRICA) 75 MG capsule Take 1 capsule (75 mg total) by mouth 2 (two) times daily. (Patient not taking: Reported on 11/13/2015) 60 capsule 1  . promethazine (PHENERGAN) 25 MG tablet Take 25 mg by mouth every 8 (eight) hours as needed for nausea or vomiting.    . sodium chloride 0.9 % infusion     . sulfamethoxazole-trimethoprim (BACTRIM DS,SEPTRA DS) 800-160 MG tablet Take 1 tablet by mouth 2 (two) times daily. 20 tablet 0  . tetracycline (ACHROMYCIN,SUMYCIN) 500 MG capsule TAKE 1 CAPSULE (500 MG TOTAL) BY MOUTH 2 (TWO) TIMES DAILY. (Patient not taking: Reported on 11/13/2015) 28 capsule 0  . TRADJENTA 5 MG TABS tablet Take 5 mg by mouth daily.     . traMADol (ULTRAM) 50 MG tablet TAKE 1 TABLET BY MOUTH EVERY 8 HOURS AS NEEDED FOR PAIN 30 tablet 0  . vancomycin (VANCOCIN) 10 G SOLR injection      Current Facility-Administered Medications on File Prior to Visit  Medication Dose Route Frequency Provider Last Rate Last Dose  . triamcinolone acetonide (KENALOG) 10 MG/ML injection 10 mg  10 mg Other Once [employer] Anan Dapolito, DPM      . triamcinolone acetonide (KENALOG-40) injection 20 mg  20 mg Other Once Asencion Islamitorya Aarin Bluett, DPM        Allergies  Allergen Reactions  . Levofloxacin Nausea And  Vomiting    Other reaction(s): Confusion    Objective: There were no vitals filed for this visit.  General: No acute distress, AAOx3  Right foot: Mild ecchymosis to the dorsal lateral aspect of the right foot Circular Plantar medial ulceration with budding hyper-granular base measures post excision 4x2.4x 0.3cm filling in with clear serous drainage, no erythema, no warmth, no active drainage, no signs of infection noted, Capillary fill time <3 seconds in all digits, gross sensation present via light touch to right foot. No pain or crepitation with range of motion right foot however to the lateral aspect. There is slight mild tenderness along the fifth ray. Subjective weakness in legs and  recent history of pancreatitis flare with rectal bleeding, 2 weeks ago.  No pain with calf compression.   Assessment and Plan:  Problem List Items Addressed This Visit    None    Visit Diagnoses    Diabetic ulcer of right midfoot associated with type 2 diabetes mellitus, with fat layer exposed (HCC)    -  Primary   Relevant Orders   DG Foot 2 Views Right   Diabetic polyneuropathy associated with type 2 diabetes mellitus (HCC)       Relevant Orders   DG Foot 2 Views Right   Post-operative state       Relevant Orders   DG Foot 2 Views Right   Charcot ankle, right       Relevant Orders   DG Foot 2 Views Right   Right foot pain       Relevant Orders   DG Foot 2 Views Right   Weakness of both lower extremities         -Patient seen and evaluated -X-rays ordered for new onset pain at the lateral aspect of the right foot, revealing no acute findings. Changes consistent of chronic Charcot -Continue with Celebrex for chronic inflammation -Recommend topical pain rub, which she has at home and tramadol as needed -Awaiting ABIs, PVRs the setting of chronic ulceration -Re-Discussed treatment options and continued care for chronic nonhealing wound -Cleansed and debrided Wound margins to healthy bleeding and  applied Silver nitrate to hyper- granular areas at right foot ulcer secured with sterile dressings with Ace wrap and stockinet, right foot . Patient to continue with same outer dressing changes of Prisma 3 times per week -Patient to also undergo repeat the wound debridement surgery with use of Integra flowable wound matrix and Integra graft, after the holiday season. Patient to sign consent and to get history and physical Visit. -Continue with limited weightbearing to right heel only for transfers and use of rolling walker -Rx PT/OT at home for weakness -Return in 2 weeks for ulcer care. In the meantime, patient to call office if any issues or problems arise.   Asencion Islam, DPM

## 2016-05-10 DIAGNOSIS — L97512 Non-pressure chronic ulcer of other part of right foot with fat layer exposed: Secondary | ICD-10-CM | POA: Diagnosis not present

## 2016-05-10 DIAGNOSIS — L97511 Non-pressure chronic ulcer of other part of right foot limited to breakdown of skin: Secondary | ICD-10-CM | POA: Diagnosis not present

## 2016-05-15 DIAGNOSIS — R531 Weakness: Secondary | ICD-10-CM | POA: Diagnosis not present

## 2016-05-16 ENCOUNTER — Encounter: Payer: Self-pay | Admitting: Sports Medicine

## 2016-05-16 ENCOUNTER — Ambulatory Visit (INDEPENDENT_AMBULATORY_CARE_PROVIDER_SITE_OTHER): Payer: Medicare Other | Admitting: Sports Medicine

## 2016-05-16 DIAGNOSIS — M79671 Pain in right foot: Secondary | ICD-10-CM | POA: Diagnosis not present

## 2016-05-16 DIAGNOSIS — E1142 Type 2 diabetes mellitus with diabetic polyneuropathy: Secondary | ICD-10-CM

## 2016-05-16 DIAGNOSIS — R531 Weakness: Secondary | ICD-10-CM

## 2016-05-16 DIAGNOSIS — E11621 Type 2 diabetes mellitus with foot ulcer: Secondary | ICD-10-CM

## 2016-05-16 DIAGNOSIS — M14671 Charcot's joint, right ankle and foot: Secondary | ICD-10-CM

## 2016-05-16 DIAGNOSIS — L97412 Non-pressure chronic ulcer of right heel and midfoot with fat layer exposed: Secondary | ICD-10-CM | POA: Diagnosis not present

## 2016-05-16 DIAGNOSIS — R29898 Other symptoms and signs involving the musculoskeletal system: Secondary | ICD-10-CM

## 2016-05-16 MED ORDER — LIDOCAINE 5 % EX OINT: 1.0000 "application " | TOPICAL_OINTMENT | CUTANEOUS | 2 refills | Status: DC | PRN

## 2016-05-16 NOTE — Progress Notes (Signed)
Subjective: Emily Miranda is a 76 y.o. female patient seen today in office for POV #15 (DOS 01-08-16), S/P Right foot wound debridement and placement of stravix allograft and status post, office excision of ulceration 02/29/2016 with pathology revealing traumatized tissue with no bacterial pathogens. Patient is currently applying primsa Ag as instructed 2-3x per week and every day changing outer dressing since wound drains a clear drainage. Patient also reports that her right foot seems like it is swelling more and that she still has the pain on the lateral side of her right foot. Patient has also started physical therapy where they are helping to strengthen her legs, especially her left leg while keeping her nonweightbearing on right. Patient is also having issues with getting her wound care dressing supplies from [place]; denies pain at surgical/excision site, denies calf pain, denies headache, chest pain, shortness of breath, nausea, vomiting, fever, or chills. No other issues noted.   FBS "good" per patient  Patient Active Problem List   Diagnosis Date Noted  . Wound infection 11/13/2015  . Abdominal pain, RLQ (right lower quadrant) -chronic since 2009 06/02/2013  . Constipation, chronic 06/02/2013  . SUI (stress urinary incontinence, female) 06/02/2013  . Recurrent UTI (urinary tract infection) 06/02/2013  . HTN (hypertension)   . CHEST PAIN 11/27/2009  . OVERWEIGHT 11/28/2008  . ESSENTIAL HYPERTENSION, BENIGN 11/28/2008  . DM 11/25/2008  . AORTIC STENOSIS 11/25/2008  . PANCREATITIS, HX OF 11/25/2008    Current Outpatient Prescriptions on File Prior to Visit  Medication Sig Dispense Refill  . amLODipine (NORVASC) 5 MG tablet Take 5 mg by mouth daily.     Marland Kitchen amoxicillin (AMOXIL) 875 MG tablet     . Ascorbic Acid (VITAMIN C) 100 MG tablet Take 100 mg by mouth daily.      . celecoxib (CELEBREX) 400 MG capsule Take 1 capsule (400 mg total) by mouth daily after breakfast. 30 capsule 0   . cephALEXin (KEFLEX) 500 MG capsule Take 1 capsule (500 mg total) by mouth 2 (two) times daily. (Patient not taking: Reported on 11/13/2015) 28 capsule 0  . chlorthalidone (HYGROTON) 25 MG tablet Take 0.5 tablets (12.5 mg total) by mouth daily. 15 tablet 11  . diphenoxylate-atropine (LOMOTIL) 2.5-0.025 MG tablet Take 1 tablet by mouth 4 (four) times daily as needed for diarrhea or loose stools. 30 tablet 0  . docusate sodium (COLACE) 100 MG capsule TAKE 1 TABLET BY MOUTH EVERY 8 HOURS, AS NEEDED FOR CONSTIPATION 30 capsule 0  . ertapenem 1 g in sodium chloride 0.9 % 50 mL Inject 1 g into the vein daily. 13 Syringe 0  . fexofenadine (ALLEGRA) 180 MG tablet Take 180 mg by mouth daily.  5  . fluconazole (DIFLUCAN) 100 MG tablet Take two tablets daily for 14 days. 28 tablet 0  . fluticasone (FLONASE) 50 MCG/ACT nasal spray     . furosemide (LASIX) 20 MG tablet Take 20 mg by mouth daily. Reported on 11/13/2015    . glimepiride (AMARYL) 4 MG tablet Take 4 mg by mouth daily with breakfast.    . HYDROcodone-acetaminophen (NORCO) 10-325 MG tablet Take 1 tablet by mouth every 6 (six) hours as needed.    Marland Kitchen HYDROcodone-acetaminophen (NORCO) 5-325 MG per tablet Take 1 tablet by mouth every 6 (six) hours as needed. 10 tablet 0  . HYDROcodone-acetaminophen (NORCO/VICODIN) 5-325 MG tablet Take by mouth.    Pincus Sanes 1 g injection     . linagliptin (TRADJENTA) 5 MG TABS tablet Take by  mouth.    . linezolid (ZYVOX) 600 MG/300ML IVPB Inject 300 mLs (600 mg total) into the vein every 12 (twelve) hours. (Patient not taking: Reported on 11/13/2015) 100 mL 23  . lisinopril (PRINIVIL,ZESTRIL) 10 MG tablet Take 10 mg by mouth daily.    Marland Kitchen lisinopril (PRINIVIL,ZESTRIL) 30 MG tablet     . lubiprostone (AMITIZA) 8 MCG capsule TAKE 1 CAPSULE (8 MCG TOTAL) BY MOUTH 2 (TWO) TIMES A DAY WITH MEALS.    . methocarbamol (ROBAXIN) 500 MG tablet Take 1.5 tablets (750 mg total) by mouth every 8 (eight) hours as needed for muscle spasms  (use for muscle cramps/pain). (Patient not taking: Reported on 11/13/2015) 30 tablet 2  . naproxen (NAPROSYN) 500 MG tablet Take 1 tablet (500 mg total) by mouth 2 (two) times daily with a meal. (Patient not taking: Reported on 11/13/2015) 40 tablet 1  . nitrofurantoin (MACRODANTIN) 50 MG capsule     . nitrofurantoin, macrocrystal-monohydrate, (MACROBID) 100 MG capsule Take 100 mg by mouth 2 (two) times daily.     . nitroGLYCERIN (NITRODUR - DOSED IN MG/24 HR) 0.1 mg/hr patch Frequency:UNKNOWN   Dosage:0.0     Instructions:  Note:Dose: .    . omeprazole (PRILOSEC) 20 MG capsule     . omeprazole (PRILOSEC) 20 MG capsule TAKE 1 CAPSULE BY MOUTH IN THE MORNING    . ONE TOUCH ULTRA TEST test strip 1 each by Other route once a week.     Marland Kitchen oxyCODONE-acetaminophen (PERCOCET/ROXICET) 5-325 MG tablet Take 1-2 tablets PO Q4H PRN pain    . Phenyleph-Doxylamine-DM-APAP (ALKA SELTZER PLUS PO) Take 1 tablet by mouth daily as needed (congestion). Reported on 11/13/2015    . pregabalin (LYRICA) 75 MG capsule Take 1 capsule (75 mg total) by mouth 2 (two) times daily. (Patient not taking: Reported on 11/13/2015) 60 capsule 1  . promethazine (PHENERGAN) 25 MG tablet Take 25 mg by mouth every 8 (eight) hours as needed for nausea or vomiting.    . sodium chloride 0.9 % infusion     . sulfamethoxazole-trimethoprim (BACTRIM DS,SEPTRA DS) 800-160 MG tablet Take 1 tablet by mouth 2 (two) times daily. 20 tablet 0  . tetracycline (ACHROMYCIN,SUMYCIN) 500 MG capsule TAKE 1 CAPSULE (500 MG TOTAL) BY MOUTH 2 (TWO) TIMES DAILY. (Patient not taking: Reported on 11/13/2015) 28 capsule 0  . TRADJENTA 5 MG TABS tablet Take 5 mg by mouth daily.     . traMADol (ULTRAM) 50 MG tablet TAKE 1 TABLET BY MOUTH EVERY 8 HOURS AS NEEDED FOR PAIN 30 tablet 0  . vancomycin (VANCOCIN) 10 G SOLR injection      Current Facility-Administered Medications on File Prior to Visit  Medication Dose Route Frequency Provider Last Rate Last Dose  . triamcinolone  acetonide (KENALOG) 10 MG/ML injection 10 mg  10 mg Other Once [employer], DPM      . triamcinolone acetonide (KENALOG-40) injection 20 mg  20 mg Other Once Asencion Islam, DPM        Allergies  Allergen Reactions  . Levofloxacin Nausea And Vomiting    Other reaction(s): Confusion    Objective: There were no vitals filed for this visit.  General: No acute distress, AAOx3  Right foot: Mild ecchymosis to the dorsal lateral aspect of the right foot Circular Plantar medial ulceration with budding hyper-granular base measures post excision 4x 3 x 0.3cm filling in with clear and mucous serous drainage, no erythema, no warmth, no active drainage, no acute signs of infection noted, Capillary  fill time <3 seconds in all digits, gross sensation present via light touch to right foot. There is diffuse swelling to the right foot with no cellulitis or lymphangitis. No pain or crepitation with range of motion right foot however to the lateral aspect. There is slight mild tenderness along the fifth ray. Subjective weakness in legs and recent history of pancreatitis flare with rectal bleeding, 4 weeks ago.  No pain with calf compression.   Assessment and Plan:  Problem List Items Addressed This Visit    None    Visit Diagnoses    Diabetic ulcer of right midfoot associated with type 2 diabetes mellitus, with fat layer exposed (HCC)    -  Primary   Relevant Orders   WOUND CULTURE   Diabetic polyneuropathy associated with type 2 diabetes mellitus (HCC)       Relevant Orders   WOUND CULTURE   Charcot ankle, right       Relevant Orders   WOUND CULTURE   Right foot pain       Relevant Medications   lidocaine (XYLOCAINE) 5 % ointment   Other Relevant Orders   WOUND CULTURE   Weakness of both lower extremities       Relevant Orders   WOUND CULTURE   Weakness       Relevant Orders   WOUND CULTURE     -Patient seen and evaluated -Discussed swelling and changes to foot, likely secondary to  Charcot deformity. However, in the setting of ulceration. A wound culture was obtained and sent to [facility]; will call patient if antibiotics are needed -Continue with Celebrex for chronic inflammation -Prescribed topical pain rub of lidocaine to use at right lateral foot, and continue with tramadol which she has at home as needed for pain -Awaiting ABIs, PVRs the setting of chronic ulceration -Re-Discussed treatment options and continued care for chronic nonhealing wound; Patient states that she wants to wait to the weather starts getting better before having another foot surgery. States that the likely best time we'll probably the end of February -Cleansed right foot ulceration and applied Silver nitrate to hyper- granular areas at right foot ulcer secured with sterile dressings with Ace wrap and stockinet, right foot . Patient should continue with daily dressing changes consisting of 4 x 4, ABDs, Kerlix and for this week only since there was mucoid drainage to discontinue use of Prisma until wound is reevaluated on next week -Continue with PT/OT at home for weakness and nonweightbearing on right lower extremity. May use rolling walker or transport wheelchair to assist with ambulation -Return in 1 week for ulcer care. In the meantime, patient to call office if any issues or problems arise. May consider using Surgicel for an additional silver dressing, next visit if the wound continues to be hyper-granular.   Asencion Islam, DPM

## 2016-05-17 ENCOUNTER — Other Ambulatory Visit: Payer: Self-pay | Admitting: Sports Medicine

## 2016-05-17 ENCOUNTER — Telehealth: Payer: Self-pay | Admitting: *Deleted

## 2016-05-17 DIAGNOSIS — L97511 Non-pressure chronic ulcer of other part of right foot limited to breakdown of skin: Secondary | ICD-10-CM | POA: Diagnosis not present

## 2016-05-17 DIAGNOSIS — R609 Edema, unspecified: Secondary | ICD-10-CM

## 2016-05-17 DIAGNOSIS — L97412 Non-pressure chronic ulcer of right heel and midfoot with fat layer exposed: Principal | ICD-10-CM

## 2016-05-17 DIAGNOSIS — E11621 Type 2 diabetes mellitus with foot ulcer: Secondary | ICD-10-CM

## 2016-05-17 DIAGNOSIS — L97512 Non-pressure chronic ulcer of other part of right foot with fat layer exposed: Secondary | ICD-10-CM | POA: Diagnosis not present

## 2016-05-17 DIAGNOSIS — M79661 Pain in right lower leg: Secondary | ICD-10-CM

## 2016-05-17 NOTE — Telephone Encounter (Addendum)
----- Message from Asencion Islam, DPM sent at 05/16/2016  4:53 PM EST ----- Regarding: [employer] wound care supplies [place], I called [employer] and informed them again of the patient changing her sterile gauze dressing every day and only using the Prisma every other day. The representative that helped me updated Her order with a verbal order from me, however, for future orders they request that we fax over the quantity and frequency that she is changing the dressings so that way they have it on file to send the patient, the correct quantity.   So please fax over to [facility] these wound care and dressing orders. The patient has a right diabetic foot ulcer and will be changing dressing consisting of 4 x 4, ABDs and roll gauze daily and will be using Prisma every other day. The wound care for 30 days supply should be as follows: Roll gauze quantity of 30, ABDs pad quantity of 30, gauze sponge 12-ply quantity of 15, and Prisma quantity of 10.  This order can be faxed to [phone]. Dr. Marylene Land ordered Transport Wheelchair for pt, orders to be sent to [facility], dx - right lower extremity diabetic ulcer, pt is to be non-weight bearing, please dispense transport wheelchair.05/17/2016-Faxed copy of Dr. Marylene Land Wound Care and Dressing orders to [facility]. Faxed orders for transport wheelchair to [facility] states wheelchair order needs to be on rx pad. Faxed [facility]'s request. Thanks  Dr. Marylene Land. 05/20/2016-Leslie Kitt - [facility] states she was scheduled for OT evaluation and pt declined. 05/21/2016-Steven Ida [facility] states pt allowed one visit then refused PT. 05/23/2016-Dr. Marylene Land ordered Augmentin. Orders called to pt's husband, Jonny Ruiz and to [id number]. 05/27/2016-Faxed Tramadol orders to [id number]. 06/17/2016-Pt's pharmacy request refill to Tramadol. Dr .Marylene Land okayed refill as previously one last time and she will talk to pt concerning alternative pain management. [facility] called [id number].  06/21/2016-Dr. Marylene Land request status of pt's transport wheelchair. I spoke with Vinnie Langton [facility] and informed her that all orders and script had been sent to [facility] for wheelchair. Vinnie Langton states the orders and script had then been sent to [facility] and she would call them to check status of the wheelchair for the pt and call me. Vinnie Langton [facility] states [facility] states needs FACE TO FACE stating why need transport wheelchair and rx for wheelchair on the plain paper. Faxed required rx on plain paper, office note of 05/16/2016 and 06/20/2016 to [facility]. 06/25/2016-Cheryl - [facility] states clinicals need to say "Pt needs wheelchair for regular ADL, can not use cane or walker." Faxed clinicals up dated with required statement. 06/26/2016-Left message for Crystal - [facility] to call concerning the notes sent for wheelchair and the up date statement saying pt is unable to use cane or walker to complete regular ADL. 06/27/2016-Left message for Crystal - [facility] to call concerning what is needed to get pt's wheelchair. 06/28/2016-Crystal - [facility] states they are waiting of the insurance's prior approval, but hope to deliver the transport wheel chair today. Informed pt of Crystal - [facility] statement. 07/04/2016-C. [place] - [facility] states Dr. Marylene Land wants pt seen as soon as possible for DVT right leg. [facility] states has appt for 07/05/2016 at 11:00am. Dr. Marylene Land states pt can take the 07/05/2016 11:00am appt for venous US, pt was to be given the appt time by the [facility] office staff.  I informed [facility] that pt would accept the 07/05/2016 11:00am appt which was okayed by Dr. Marylene Land. Faxed required form, and insurance card to [facility].08/20/2016-Received refill request for Tramadol. Dr. Theodoro Doing refill of Tramadol. [id number]/21/2016-Lee - [facility] called for  refill of medication, but phone connection was difficult to understand.08/22/2016-I spoke with Nedra Hai - [facility] and she states pt request refill of Colace 100mg . Dr. Theodoro Doing refill +1additional and if continues to have constipation needs to see PCP. Orders to [facility]

## 2016-05-20 ENCOUNTER — Inpatient Hospital Stay (HOSPITAL_COMMUNITY): Admission: RE | Admit: 2016-05-20 | Payer: Medicare Other | Source: Ambulatory Visit

## 2016-05-20 NOTE — Telephone Encounter (Signed)
Thanks for letting me know -Dr. Ella Golomb  

## 2016-05-23 ENCOUNTER — Encounter: Payer: Self-pay | Admitting: Sports Medicine

## 2016-05-23 ENCOUNTER — Ambulatory Visit (INDEPENDENT_AMBULATORY_CARE_PROVIDER_SITE_OTHER): Payer: Self-pay | Admitting: Sports Medicine

## 2016-05-23 DIAGNOSIS — L97412 Non-pressure chronic ulcer of right heel and midfoot with fat layer exposed: Secondary | ICD-10-CM

## 2016-05-23 DIAGNOSIS — R29898 Other symptoms and signs involving the musculoskeletal system: Secondary | ICD-10-CM

## 2016-05-23 DIAGNOSIS — M79671 Pain in right foot: Secondary | ICD-10-CM

## 2016-05-23 DIAGNOSIS — M14671 Charcot's joint, right ankle and foot: Secondary | ICD-10-CM

## 2016-05-23 DIAGNOSIS — E1142 Type 2 diabetes mellitus with diabetic polyneuropathy: Secondary | ICD-10-CM

## 2016-05-23 DIAGNOSIS — E11621 Type 2 diabetes mellitus with foot ulcer: Secondary | ICD-10-CM

## 2016-05-23 MED ORDER — AMOXICILLIN-POT CLAVULANATE 875-125 MG PO TABS: 1.0000 | ORAL_TABLET | Freq: Two times a day (BID) | ORAL | 0 refills | Status: DC

## 2016-05-23 MED ORDER — GABAPENTIN 300 MG PO CAPS: 300.0000 mg | ORAL_CAPSULE | Freq: Every day | ORAL | 3 refills | Status: DC

## 2016-05-23 NOTE — Patient Instructions (Signed)
Pre-Operative Instructions  Congratulations, you have decided to take an important step to improving your quality of life.  You can be assured that the doctors of [facility] will be with you every step of the way.  1. Plan to be at the surgery center/hospital at least 1 (one) hour prior to your scheduled time unless otherwise directed by the surgical center/hospital staff.  You must have a responsible adult accompany you, remain during the surgery and drive you home.  Make sure you have directions to the surgical center/hospital and know how to get there on time. 2. For hospital based surgery you will need to obtain a history and physical form from your family physician within 1 month prior to the date of surgery- we will give you a form for you primary physician.  3. We make every effort to accommodate the date you request for surgery.  There are however, times where surgery dates or times have to be moved.  We will contact you as soon as possible if a change in schedule is required.   4. No Aspirin/Ibuprofen for one week before surgery.  If you are on aspirin, any non-steroidal anti-inflammatory medications (Mobic, Aleve, Ibuprofen) you should stop taking it 7 days prior to your surgery.  You make take Tylenol  For pain prior to surgery.  5. Medications- If you are taking daily heart and blood pressure medications, seizure, reflux, allergy, asthma, anxiety, pain or diabetes medications, make sure the surgery center/hospital is aware before the day of surgery so they may notify you which medications to take or avoid the day of surgery. 6. No food or drink after midnight the night before surgery unless directed otherwise by surgical center/hospital staff. 7. No alcoholic beverages 24 hours prior to surgery.  No smoking 24 hours prior to or 24 hours after surgery. 8. Wear loose pants or shorts- loose enough to fit over bandages, boots, and casts. 9. No slip on shoes, sneakers are best. 10. Bring  your boot with you to the surgery center/hospital.  Also bring crutches or a walker if your physician has prescribed it for you.  If you do not have this equipment, it will be provided for you after surgery. 11. If you have not been contracted by the surgery center/hospital by the day before your surgery, call to confirm the date and time of your surgery. 12. Leave-time from work may vary depending on the type of surgery you have.  Appropriate arrangements should be made prior to surgery with your employer. 13. Prescriptions will be provided immediately following surgery by your doctor.  Have these filled as soon as possible after surgery and take the medication as directed. 14. Remove nail polish on the operative foot. 15. Wash the night before surgery.  The night before surgery wash the foot and leg well with the antibacterial soap provided and water paying special attention to beneath the toenails and in between the toes.  Rinse thoroughly with water and dry well with a towel.  Perform this wash unless told not to do so by your physician.  Enclosed: 1 Ice pack (please put in freezer the night before surgery)   1 Hibiclens skin cleaner   Pre-op Instructions  If you have any questions regarding the instructions, do not hesitate to call our office.  [facility]: [address] [place], [place] [address] [phone]  [facility]: [address] [place], [place] [address] [phone]  [facility]: [address]  [place], [place] [address] [phone]   Dr.   Norman Regal DPM, Dr. Matthew Wagoner DPM, Dr. M. Todd Hyatt DPM, Dr. Yarieliz Wasser DPM 

## 2016-05-23 NOTE — Telephone Encounter (Signed)
-----   Message from [place], [place] sent at 05/22/2016  4:06 PM EST ----- Regarding: Rx Augmentin Cultures from last week came back as staph not MRSA please Rx Augmentin 875mg  bid x 14 days Thanks Dr. Marylene Land

## 2016-05-23 NOTE — Progress Notes (Signed)
Subjective: Emily Miranda is a 76 y.o. female patient seen today in office for POV #16 (DOS 01-08-16), S/P Right foot wound debridement and placement of stravix allograft and status post, office excision of ulceration 02/29/2016 with pathology revealing traumatized tissue with no bacterial pathogens. Patient is currently applying primsa Ag as instructed 2-3x per week and every day changing outer dressing since wound drains a clear drainage. Patient also reports that her right foot seems like it is still swollen and weak and hurts. Patient does not want more PT/OT feels like she can do it herself now. Patient denies pain at surgical/excision site, denies calf pain, denies headache, chest pain, shortness of breath, nausea, vomiting, fever, or chills. No other issues noted.   FBS "good" per patient  Patient Active Problem List   Diagnosis Date Noted  . Wound infection 11/13/2015  . Abdominal pain, RLQ (right lower quadrant) -chronic since 2009 06/02/2013  . Constipation, chronic 06/02/2013  . SUI (stress urinary incontinence, female) 06/02/2013  . Recurrent UTI (urinary tract infection) 06/02/2013  . HTN (hypertension)   . CHEST PAIN 11/27/2009  . OVERWEIGHT 11/28/2008  . ESSENTIAL HYPERTENSION, BENIGN 11/28/2008  . DM 11/25/2008  . AORTIC STENOSIS 11/25/2008  . PANCREATITIS, HX OF 11/25/2008    Current Outpatient Prescriptions on File Prior to Visit  Medication Sig Dispense Refill  . amLODipine (NORVASC) 5 MG tablet Take 5 mg by mouth daily.     Marland Kitchen amoxicillin (AMOXIL) 875 MG tablet     . amoxicillin-clavulanate (AUGMENTIN) 875-125 MG tablet Take 1 tablet by mouth 2 (two) times daily. 28 tablet 0  . Ascorbic Acid (VITAMIN C) 100 MG tablet Take 100 mg by mouth daily.      . celecoxib (CELEBREX) 400 MG capsule Take 1 capsule (400 mg total) by mouth daily after breakfast. 30 capsule 0  . cephALEXin (KEFLEX) 500 MG capsule Take 1 capsule (500 mg total) by mouth 2 (two) times daily.  (Patient not taking: Reported on 11/13/2015) 28 capsule 0  . chlorthalidone (HYGROTON) 25 MG tablet Take 0.5 tablets (12.5 mg total) by mouth daily. 15 tablet 11  . diphenoxylate-atropine (LOMOTIL) 2.5-0.025 MG tablet Take 1 tablet by mouth 4 (four) times daily as needed for diarrhea or loose stools. 30 tablet 0  . docusate sodium (COLACE) 100 MG capsule TAKE 1 TABLET BY MOUTH EVERY 8 HOURS, AS NEEDED FOR CONSTIPATION 30 capsule 0  . ertapenem 1 g in sodium chloride 0.9 % 50 mL Inject 1 g into the vein daily. 13 Syringe 0  . fexofenadine (ALLEGRA) 180 MG tablet Take 180 mg by mouth daily.  5  . fluconazole (DIFLUCAN) 100 MG tablet Take two tablets daily for 14 days. 28 tablet 0  . fluticasone (FLONASE) 50 MCG/ACT nasal spray     . furosemide (LASIX) 20 MG tablet Take 20 mg by mouth daily. Reported on 11/13/2015    . glimepiride (AMARYL) 4 MG tablet Take 4 mg by mouth daily with breakfast.    . HYDROcodone-acetaminophen (NORCO) 10-325 MG tablet Take 1 tablet by mouth every 6 (six) hours as needed.    Marland Kitchen HYDROcodone-acetaminophen (NORCO) 5-325 MG per tablet Take 1 tablet by mouth every 6 (six) hours as needed. 10 tablet 0  . HYDROcodone-acetaminophen (NORCO/VICODIN) 5-325 MG tablet Take by mouth.    Pincus Sanes 1 g injection     . lidocaine (XYLOCAINE) 5 % ointment Apply 1 application topically as needed. 50 g 2  . linagliptin (TRADJENTA) 5 MG TABS tablet Take  by mouth.    . linezolid (ZYVOX) 600 MG/300ML IVPB Inject 300 mLs (600 mg total) into the vein every 12 (twelve) hours. (Patient not taking: Reported on 11/13/2015) 100 mL 23  . lisinopril (PRINIVIL,ZESTRIL) 10 MG tablet Take 10 mg by mouth daily.    Marland Kitchen lisinopril (PRINIVIL,ZESTRIL) 30 MG tablet     . lubiprostone (AMITIZA) 8 MCG capsule TAKE 1 CAPSULE (8 MCG TOTAL) BY MOUTH 2 (TWO) TIMES A DAY WITH MEALS.    . methocarbamol (ROBAXIN) 500 MG tablet Take 1.5 tablets (750 mg total) by mouth every 8 (eight) hours as needed for muscle spasms (use for muscle  cramps/pain). (Patient not taking: Reported on 11/13/2015) 30 tablet 2  . naproxen (NAPROSYN) 500 MG tablet Take 1 tablet (500 mg total) by mouth 2 (two) times daily with a meal. (Patient not taking: Reported on 11/13/2015) 40 tablet 1  . nitrofurantoin (MACRODANTIN) 50 MG capsule     . nitrofurantoin, macrocrystal-monohydrate, (MACROBID) 100 MG capsule Take 100 mg by mouth 2 (two) times daily.     . nitroGLYCERIN (NITRODUR - DOSED IN MG/24 HR) 0.1 mg/hr patch Frequency:UNKNOWN   Dosage:0.0     Instructions:  Note:Dose: .    . omeprazole (PRILOSEC) 20 MG capsule     . omeprazole (PRILOSEC) 20 MG capsule TAKE 1 CAPSULE BY MOUTH IN THE MORNING    . ONE TOUCH ULTRA TEST test strip 1 each by Other route once a week.     Marland Kitchen oxyCODONE-acetaminophen (PERCOCET/ROXICET) 5-325 MG tablet Take 1-2 tablets PO Q4H PRN pain    . Phenyleph-Doxylamine-DM-APAP (ALKA SELTZER PLUS PO) Take 1 tablet by mouth daily as needed (congestion). Reported on 11/13/2015    . pregabalin (LYRICA) 75 MG capsule Take 1 capsule (75 mg total) by mouth 2 (two) times daily. (Patient not taking: Reported on 11/13/2015) 60 capsule 1  . promethazine (PHENERGAN) 25 MG tablet Take 25 mg by mouth every 8 (eight) hours as needed for nausea or vomiting.    . sodium chloride 0.9 % infusion     . sulfamethoxazole-trimethoprim (BACTRIM DS,SEPTRA DS) 800-160 MG tablet Take 1 tablet by mouth 2 (two) times daily. 20 tablet 0  . tetracycline (ACHROMYCIN,SUMYCIN) 500 MG capsule TAKE 1 CAPSULE (500 MG TOTAL) BY MOUTH 2 (TWO) TIMES DAILY. (Patient not taking: Reported on 11/13/2015) 28 capsule 0  . TRADJENTA 5 MG TABS tablet Take 5 mg by mouth daily.     . traMADol (ULTRAM) 50 MG tablet TAKE 1 TABLET BY MOUTH EVERY 8 HOURS AS NEEDED FOR PAIN 30 tablet 0  . vancomycin (VANCOCIN) 10 G SOLR injection      Current Facility-Administered Medications on File Prior to Visit  Medication Dose Route Frequency Provider Last Rate Last Dose  . triamcinolone acetonide  (KENALOG) 10 MG/ML injection 10 mg  10 mg Other Once [employer], DPM      . triamcinolone acetonide (KENALOG-40) injection 20 mg  20 mg Other Once Asencion Islam, DPM        Allergies  Allergen Reactions  . Levofloxacin Nausea And Vomiting    Other reaction(s): Confusion    Objective: There were no vitals filed for this visit.  General: No acute distress, AAOx3  Right foot: Mild ecchymosis to the dorsal lateral aspect of the right foot Circular Plantar medial ulceration with budding hyper-granular base measures post excision 3.5x 3 x 0.3cm filling in with minimal clear drainage, no erythema, no warmth, no active drainage, no acute signs of infection noted, Capillary fill  time <3 seconds in all digits, gross sensation present via light touch to right foot. There is diffuse swelling to the right foot with no cellulitis or lymphangitis. No pain or crepitation with range of motion right foot however to the lateral aspect. There is slight mild tenderness along the fifth ray. Subjective weakness in legs and recent history of pancreatitis flare with rectal bleeding, 5 weeks ago.  No pain with calf compression.   Assessment and Plan:  Problem List Items Addressed This Visit    None    Visit Diagnoses    Diabetic ulcer of right midfoot associated with type 2 diabetes mellitus, with fat layer exposed (HCC)    -  Primary   Diabetic polyneuropathy associated with type 2 diabetes mellitus (HCC)       Relevant Medications   gabapentin (NEURONTIN) 300 MG capsule   Charcot ankle, right       Relevant Medications   gabapentin (NEURONTIN) 300 MG capsule   Right foot pain       Weakness of both lower extremities         -Patient seen and evaluated -Discussed swelling and changes to foot, likely secondary to Charcot deformity.  -Rx Gabapentin 300 Qhs for pain -Patient to pick up Augmentin for + staph culture -Continue with Celebrex for chronic inflammation -Continue with topical pain rub of  lidocaine to use at right lateral foot -Awaiting ABIs, PVRs the setting of chronic ulceration -Re-Discussed treatment options and continued care for chronic nonhealing wound. Patient opt for surgical management. Consent obtained for wound debridement with graft by integra. Pre and Post op course explained. Risks, benefits, alternatives explained. No guarantees given or implied. Surgical booking slip submitted and provided patient with Surgical packet and info for [facility] sx center to book case 12 noon or at 1 pm -Patient has post op shoe and is to be NWB with use of rolling walker -Cleansed right foot ulceration and applied Silver nitrate to hyper- granular areas at right foot ulcer secured with sterile dressings with Ace wrap and stockinet, right foot . Patient should continue with daily dressing changes consisting of 4 x 4, ABDs, Kerlix and for this week only since there was mucoid drainage to discontinue use of Prisma until next week -Continue self PT at home for weakness and nonweightbearing on right lower extremity. May use rolling walker or transport wheelchair to assist with ambulation -Return in 1 week for ulcer care until time for surgery. In the meantime, patient to call office if any issues or problems arise. May consider using Surgicel for an additional silver dressing, next visit if the wound continues to be hyper-granular.   Asencion Islam, DPM

## 2016-05-24 ENCOUNTER — Telehealth: Payer: Self-pay | Admitting: *Deleted

## 2016-05-24 ENCOUNTER — Encounter (HOSPITAL_COMMUNITY): Payer: Self-pay | Admitting: Sports Medicine

## 2016-05-24 NOTE — Telephone Encounter (Signed)
"  I need to schedule my surgery with Dr. Marylene Land."  Do you have a date in mind?  "I can do it whenever.  I have the forms to take to my doctor."  She may be able to do it on January 29 or February 5.  I have to see what day is available at the surgical center.  "Okay I will put it on my calendar for both days with a question mark."  I will let you know when I get it scheduled.

## 2016-05-24 NOTE — Telephone Encounter (Signed)
Make sure the surgery when scheduled is around [facility]. The patient does not want an early AM surgery since its hard for her to get out and up early in the morning. -Dr. Marylene Land

## 2016-05-25 ENCOUNTER — Other Ambulatory Visit: Payer: Self-pay | Admitting: Sports Medicine

## 2016-05-25 DIAGNOSIS — M79671 Pain in right foot: Secondary | ICD-10-CM

## 2016-05-27 DIAGNOSIS — R0602 Shortness of breath: Secondary | ICD-10-CM | POA: Diagnosis not present

## 2016-05-27 DIAGNOSIS — E1165 Type 2 diabetes mellitus with hyperglycemia: Secondary | ICD-10-CM | POA: Diagnosis not present

## 2016-05-27 DIAGNOSIS — R0789 Other chest pain: Secondary | ICD-10-CM | POA: Diagnosis not present

## 2016-05-27 DIAGNOSIS — R079 Chest pain, unspecified: Secondary | ICD-10-CM | POA: Diagnosis not present

## 2016-05-30 ENCOUNTER — Ambulatory Visit: Payer: Medicare Other | Admitting: Sports Medicine

## 2016-05-31 ENCOUNTER — Other Ambulatory Visit: Payer: Self-pay | Admitting: Cardiology

## 2016-05-31 ENCOUNTER — Other Ambulatory Visit: Payer: Self-pay | Admitting: Sports Medicine

## 2016-06-02 NOTE — Progress Notes (Deleted)
HPI The patient presents for follow up of aortic stenosis.  I last saw her one year ago.  Echo in Jan of 2016 demonstrated only moderate AS.  ***  Both feet for soft boots.   Ulcer on right foot and fracture heal on left.  With her level of activity she is doing OK.  The patient denies any new symptoms such as chest discomfort, neck or arm discomfort. There has been no new shortness of breath, PND or orthopnea. There have been no reported palpitations, presyncope or syncope.   Allergies  Allergen Reactions  . Levofloxacin Nausea And Vomiting    Other reaction(s): Confusion    Current Outpatient Prescriptions  Medication Sig Dispense Refill  . amLODipine (NORVASC) 5 MG tablet Take 5 mg by mouth daily.     Marland Kitchen amoxicillin (AMOXIL) 875 MG tablet     . amoxicillin-clavulanate (AUGMENTIN) 875-125 MG tablet Take 1 tablet by mouth 2 (two) times daily. 28 tablet 0  . Ascorbic Acid (VITAMIN C) 100 MG tablet Take 100 mg by mouth daily.      . celecoxib (CELEBREX) 400 MG capsule Take 1 capsule (400 mg total) by mouth daily after breakfast. 30 capsule 0  . cephALEXin (KEFLEX) 500 MG capsule Take 1 capsule (500 mg total) by mouth 2 (two) times daily. (Patient not taking: Reported on 11/13/2015) 28 capsule 0  . chlorthalidone (HYGROTON) 25 MG tablet TAKE 0.5 TABLETS (12.5 MG TOTAL) BY MOUTH DAILY. 15 tablet 1  . diphenoxylate-atropine (LOMOTIL) 2.5-0.025 MG tablet Take 1 tablet by mouth 4 (four) times daily as needed for diarrhea or loose stools. 30 tablet 0  . docusate sodium (COLACE) 100 MG capsule TAKE 1 TABLET BY MOUTH EVERY 8 HOURS, AS NEEDED FOR CONSTIPATION 30 capsule 0  . ertapenem 1 g in sodium chloride 0.9 % 50 mL Inject 1 g into the vein daily. 13 Syringe 0  . fexofenadine (ALLEGRA) 180 MG tablet Take 180 mg by mouth daily.  5  . fluconazole (DIFLUCAN) 100 MG tablet Take two tablets daily for 14 days. 28 tablet 0  . fluticasone (FLONASE) 50 MCG/ACT nasal spray     . furosemide (LASIX) 20 MG  tablet Take 20 mg by mouth daily. Reported on 11/13/2015    . gabapentin (NEURONTIN) 300 MG capsule Take 1 capsule (300 mg total) by mouth at bedtime. 90 capsule 3  . glimepiride (AMARYL) 4 MG tablet Take 4 mg by mouth daily with breakfast.    . HYDROcodone-acetaminophen (NORCO) 10-325 MG tablet Take 1 tablet by mouth every 6 (six) hours as needed.    Marland Kitchen HYDROcodone-acetaminophen (NORCO) 5-325 MG per tablet Take 1 tablet by mouth every 6 (six) hours as needed. 10 tablet 0  . HYDROcodone-acetaminophen (NORCO/VICODIN) 5-325 MG tablet Take by mouth.    Pincus Sanes 1 g injection     . lidocaine (XYLOCAINE) 5 % ointment Apply 1 application topically as needed. 50 g 2  . linagliptin (TRADJENTA) 5 MG TABS tablet Take by mouth.    . linezolid (ZYVOX) 600 MG/300ML IVPB Inject 300 mLs (600 mg total) into the vein every 12 (twelve) hours. (Patient not taking: Reported on 11/13/2015) 100 mL 23  . lisinopril (PRINIVIL,ZESTRIL) 10 MG tablet Take 10 mg by mouth daily.    Marland Kitchen lisinopril (PRINIVIL,ZESTRIL) 30 MG tablet     . lubiprostone (AMITIZA) 8 MCG capsule TAKE 1 CAPSULE (8 MCG TOTAL) BY MOUTH 2 (TWO) TIMES A DAY WITH MEALS.    . methocarbamol (ROBAXIN) 500 MG  tablet Take 1.5 tablets (750 mg total) by mouth every 8 (eight) hours as needed for muscle spasms (use for muscle cramps/pain). (Patient not taking: Reported on 11/13/2015) 30 tablet 2  . naproxen (NAPROSYN) 500 MG tablet Take 1 tablet (500 mg total) by mouth 2 (two) times daily with a meal. (Patient not taking: Reported on 11/13/2015) 40 tablet 1  . nitrofurantoin (MACRODANTIN) 50 MG capsule     . nitrofurantoin, macrocrystal-monohydrate, (MACROBID) 100 MG capsule Take 100 mg by mouth 2 (two) times daily.     . nitroGLYCERIN (NITRODUR - DOSED IN MG/24 HR) 0.1 mg/hr patch Frequency:UNKNOWN   Dosage:0.0     Instructions:  Note:Dose: .    . omeprazole (PRILOSEC) 20 MG capsule     . omeprazole (PRILOSEC) 20 MG capsule TAKE 1 CAPSULE BY MOUTH IN THE MORNING    . ONE  TOUCH ULTRA TEST test strip 1 each by Other route once a week.     Marland Kitchen oxyCODONE-acetaminophen (PERCOCET/ROXICET) 5-325 MG tablet Take 1-2 tablets PO Q4H PRN pain    . Phenyleph-Doxylamine-DM-APAP (ALKA SELTZER PLUS PO) Take 1 tablet by mouth daily as needed (congestion). Reported on 11/13/2015    . pregabalin (LYRICA) 75 MG capsule Take 1 capsule (75 mg total) by mouth 2 (two) times daily. (Patient not taking: Reported on 11/13/2015) 60 capsule 1  . promethazine (PHENERGAN) 25 MG tablet Take 25 mg by mouth every 8 (eight) hours as needed for nausea or vomiting.    . sodium chloride 0.9 % infusion     . sulfamethoxazole-trimethoprim (BACTRIM DS,SEPTRA DS) 800-160 MG tablet Take 1 tablet by mouth 2 (two) times daily. 20 tablet 0  . tetracycline (ACHROMYCIN,SUMYCIN) 500 MG capsule TAKE 1 CAPSULE (500 MG TOTAL) BY MOUTH 2 (TWO) TIMES DAILY. (Patient not taking: Reported on 11/13/2015) 28 capsule 0  . TRADJENTA 5 MG TABS tablet Take 5 mg by mouth daily.     . traMADol (ULTRAM) 50 MG tablet TAKE 1 TABLET BY MOUTH EVER 8 HOURS AS NEEDED FOR PAIN CAN FILL 11/25 30 tablet 0  . vancomycin (VANCOCIN) 10 G SOLR injection      Current Facility-Administered Medications  Medication Dose Route Frequency Provider Last Rate Last Dose  . triamcinolone acetonide (KENALOG) 10 MG/ML injection 10 mg  10 mg Other Once [employer], DPM      . triamcinolone acetonide (KENALOG-40) injection 20 mg  20 mg Other Once Asencion Islam, DPM        Past Medical History:  Diagnosis Date  . Aortic stenosis, mild   . Charcot foot due to diabetes mellitus (HCC)   . Diabetes mellitus   . HTN (hypertension)   . Hyperlipidemia   . Neuropathy (HCC)   . Pancreatitis     Past Surgical History:  Procedure Laterality Date  . ABDOMINAL HYSTERECTOMY    . APPENDECTOMY    . FOOT SURGERY    . LAPAROSCOPIC INCISIONAL / UMBILICAL / VENTRAL HERNIA REPAIR  01/04/2008   Dr Bertram Savin  . LAPAROSCOPIC LYSIS OF ADHESIONS  2007   Dr Donovan Kail   . OVARIAN CYST REMOVAL    . ROBOT ASSISTED PYELOPLASTY  02/2007   Dr Laverle Patter  . SUTURE REMOVAL  08/17/2009   Dr Bertram Savin .  Right paramedian [employer] stitch  . TONSILLECTOMY      ROS:  ***  As stated in the HPI and negative for all other systems.  PHYSICAL EXAM There were no vitals taken for this visit. GENERAL:  Well  appearing NECK:  No jugular venous distention, waveform within normal limits, carotid upstroke brisk and symmetric, no bruits, no thyromegaly LYMPHATICS:  No cervical, inguinal adenopathy LUNGS:  Clear to auscultation bilaterally BACK:  No CVA tenderness CHEST:  Unremarkable HEART:  PMI not displaced or sustained,S1 and S2 within normal limits, no S3, no S4, no clicks, no rubs.  Apical early peaking systolic murmur heard out the aortic outflow tract. ABD:  Flat, positive bowel sounds normal in frequency in pitch, no bruits, no rebound, no guarding, no midline pulsatile mass, no hepatomegaly, no splenomegaly EXT:  2 plus pulses throughout, no edema, no cyanosis no clubbing    EKG:  Sinus rhythm, rate ***, axis within normal limits, intervals within normal limits, no acute ST-T wave changes, poor anterior R-wave progression  06/02/2016   ASSESSMENT AND PLAN   HTN (hypertension) -  I am going to change her from hydrochlorothiazide to chlorthalidone as this has a better result with blood pressure control.    AORTIC STENOSIS -  This hasn't changed on physical exam she is having no new symptoms. I will follow this clinically. Repeat echocardiography is not indicated at this time.

## 2016-06-04 ENCOUNTER — Ambulatory Visit: Payer: Medicare Other | Admitting: Cardiology

## 2016-06-04 NOTE — Telephone Encounter (Signed)
I asked patient's husband to ask her if she can do surgery on 06/10/16 at [facility].  He said he would give her message to give me a call back.  She had gone to her primary care doctor to get clearance for surgery.

## 2016-06-04 NOTE — Telephone Encounter (Signed)
I am calling to see when you would like to schedule your surgery.  "I have an appointment with my doctor tomorrow and I see Dr. Marylene Land on Friday.  I want to wait and see if my doctor will clear me to have surgery first.  I will schedule a date when I see Dr. Marylene Land on Friday."

## 2016-06-04 NOTE — Telephone Encounter (Signed)
I attempted to call patient back, there wasn't an answer.  Surgery will not be able to be done on Monday, January 29 after all due to no Anesthesiologist available on January 29.

## 2016-06-05 ENCOUNTER — Encounter: Payer: Self-pay | Admitting: *Deleted

## 2016-06-05 DIAGNOSIS — H6191 Disorder of right external ear, unspecified: Secondary | ICD-10-CM | POA: Diagnosis not present

## 2016-06-05 DIAGNOSIS — Z79899 Other long term (current) drug therapy: Secondary | ICD-10-CM | POA: Diagnosis not present

## 2016-06-05 DIAGNOSIS — E1165 Type 2 diabetes mellitus with hyperglycemia: Secondary | ICD-10-CM | POA: Diagnosis not present

## 2016-06-05 DIAGNOSIS — I10 Essential (primary) hypertension: Secondary | ICD-10-CM | POA: Diagnosis not present

## 2016-06-05 DIAGNOSIS — E785 Hyperlipidemia, unspecified: Secondary | ICD-10-CM | POA: Diagnosis not present

## 2016-06-05 DIAGNOSIS — D649 Anemia, unspecified: Secondary | ICD-10-CM | POA: Diagnosis not present

## 2016-06-06 DIAGNOSIS — L97511 Non-pressure chronic ulcer of other part of right foot limited to breakdown of skin: Secondary | ICD-10-CM | POA: Diagnosis not present

## 2016-06-06 DIAGNOSIS — L97512 Non-pressure chronic ulcer of other part of right foot with fat layer exposed: Secondary | ICD-10-CM | POA: Diagnosis not present

## 2016-06-07 ENCOUNTER — Encounter: Payer: Self-pay | Admitting: Sports Medicine

## 2016-06-07 ENCOUNTER — Ambulatory Visit (INDEPENDENT_AMBULATORY_CARE_PROVIDER_SITE_OTHER): Payer: Medicare Other | Admitting: Sports Medicine

## 2016-06-07 DIAGNOSIS — R29898 Other symptoms and signs involving the musculoskeletal system: Secondary | ICD-10-CM

## 2016-06-07 DIAGNOSIS — E1142 Type 2 diabetes mellitus with diabetic polyneuropathy: Secondary | ICD-10-CM

## 2016-06-07 DIAGNOSIS — E11621 Type 2 diabetes mellitus with foot ulcer: Secondary | ICD-10-CM

## 2016-06-07 DIAGNOSIS — M14671 Charcot's joint, right ankle and foot: Secondary | ICD-10-CM | POA: Diagnosis not present

## 2016-06-07 DIAGNOSIS — L97412 Non-pressure chronic ulcer of right heel and midfoot with fat layer exposed: Secondary | ICD-10-CM

## 2016-06-07 DIAGNOSIS — M79671 Pain in right foot: Secondary | ICD-10-CM

## 2016-06-07 NOTE — Progress Notes (Signed)
Subjective: Emily Miranda is a 76 y.o. female patient seen today in office for POV #18 (DOS 01-08-16), S/P Right foot wound debridement and placement of stravix allograft and status post, office excision of ulceration 02/29/2016 with pathology revealing traumatized tissue with no bacterial pathogens. Patient states that she went to hospital on Monday for chest pain of which revealed a strained chest muscle, patient also reports that she could not take gabapentin make her shake more; states that she is doing well with pain using her tramadol; denies calf pain, denies headache, current chest pain (recommended topical lidocaine), shortness of breath, nausea, vomiting, fever, or chills. No other issues noted.   FBS "good" per patient  Patient Active Problem List   Diagnosis Date Noted  . Wound infection 11/13/2015  . Abdominal pain, RLQ (right lower quadrant) -chronic since 2009 06/02/2013  . Constipation, chronic 06/02/2013  . SUI (stress urinary incontinence, female) 06/02/2013  . Recurrent UTI (urinary tract infection) 06/02/2013  . HTN (hypertension)   . CHEST PAIN 11/27/2009  . OVERWEIGHT 11/28/2008  . ESSENTIAL HYPERTENSION, BENIGN 11/28/2008  . DM 11/25/2008  . AORTIC STENOSIS 11/25/2008  . PANCREATITIS, HX OF 11/25/2008    Current Outpatient Prescriptions on File Prior to Visit  Medication Sig Dispense Refill  . amLODipine (NORVASC) 5 MG tablet Take 5 mg by mouth daily.     Marland Kitchen amoxicillin (AMOXIL) 875 MG tablet     . amoxicillin-clavulanate (AUGMENTIN) 875-125 MG tablet Take 1 tablet by mouth 2 (two) times daily. 28 tablet 0  . Ascorbic Acid (VITAMIN C) 100 MG tablet Take 100 mg by mouth daily.      . celecoxib (CELEBREX) 400 MG capsule Take 1 capsule (400 mg total) by mouth daily after breakfast. 30 capsule 0  . cephALEXin (KEFLEX) 500 MG capsule Take 1 capsule (500 mg total) by mouth 2 (two) times daily. (Patient not taking: Reported on 11/13/2015) 28 capsule 0  . chlorthalidone  (HYGROTON) 25 MG tablet TAKE 0.5 TABLETS (12.5 MG TOTAL) BY MOUTH DAILY. 15 tablet 1  . diphenoxylate-atropine (LOMOTIL) 2.5-0.025 MG tablet Take 1 tablet by mouth 4 (four) times daily as needed for diarrhea or loose stools. 30 tablet 0  . docusate sodium (COLACE) 100 MG capsule TAKE 1 TABLET BY MOUTH EVERY 8 HOURS, AS NEEDED FOR CONSTIPATION 30 capsule 0  . ertapenem 1 g in sodium chloride 0.9 % 50 mL Inject 1 g into the vein daily. 13 Syringe 0  . fexofenadine (ALLEGRA) 180 MG tablet Take 180 mg by mouth daily.  5  . fluconazole (DIFLUCAN) 100 MG tablet Take two tablets daily for 14 days. 28 tablet 0  . fluticasone (FLONASE) 50 MCG/ACT nasal spray     . furosemide (LASIX) 20 MG tablet Take 20 mg by mouth daily. Reported on 11/13/2015    . gabapentin (NEURONTIN) 300 MG capsule Take 1 capsule (300 mg total) by mouth at bedtime. 90 capsule 3  . glimepiride (AMARYL) 4 MG tablet Take 4 mg by mouth daily with breakfast.    . HYDROcodone-acetaminophen (NORCO) 10-325 MG tablet Take 1 tablet by mouth every 6 (six) hours as needed.    Marland Kitchen HYDROcodone-acetaminophen (NORCO) 5-325 MG per tablet Take 1 tablet by mouth every 6 (six) hours as needed. 10 tablet 0  . HYDROcodone-acetaminophen (NORCO/VICODIN) 5-325 MG tablet Take by mouth.    Pincus Sanes 1 g injection     . lidocaine (XYLOCAINE) 5 % ointment Apply 1 application topically as needed. 50 g 2  .  linagliptin (TRADJENTA) 5 MG TABS tablet Take by mouth.    . linezolid (ZYVOX) 600 MG/300ML IVPB Inject 300 mLs (600 mg total) into the vein every 12 (twelve) hours. (Patient not taking: Reported on 11/13/2015) 100 mL 23  . lisinopril (PRINIVIL,ZESTRIL) 10 MG tablet Take 10 mg by mouth daily.    Marland Kitchen lisinopril (PRINIVIL,ZESTRIL) 30 MG tablet     . lubiprostone (AMITIZA) 8 MCG capsule TAKE 1 CAPSULE (8 MCG TOTAL) BY MOUTH 2 (TWO) TIMES A DAY WITH MEALS.    . methocarbamol (ROBAXIN) 500 MG tablet Take 1.5 tablets (750 mg total) by mouth every 8 (eight) hours as needed for  muscle spasms (use for muscle cramps/pain). (Patient not taking: Reported on 11/13/2015) 30 tablet 2  . naproxen (NAPROSYN) 500 MG tablet Take 1 tablet (500 mg total) by mouth 2 (two) times daily with a meal. (Patient not taking: Reported on 11/13/2015) 40 tablet 1  . nitrofurantoin (MACRODANTIN) 50 MG capsule     . nitrofurantoin, macrocrystal-monohydrate, (MACROBID) 100 MG capsule Take 100 mg by mouth 2 (two) times daily.     . nitroGLYCERIN (NITRODUR - DOSED IN MG/24 HR) 0.1 mg/hr patch Frequency:UNKNOWN   Dosage:0.0     Instructions:  Note:Dose: .    . omeprazole (PRILOSEC) 20 MG capsule     . omeprazole (PRILOSEC) 20 MG capsule TAKE 1 CAPSULE BY MOUTH IN THE MORNING    . ONE TOUCH ULTRA TEST test strip 1 each by Other route once a week.     Marland Kitchen oxyCODONE-acetaminophen (PERCOCET/ROXICET) 5-325 MG tablet Take 1-2 tablets PO Q4H PRN pain    . Phenyleph-Doxylamine-DM-APAP (ALKA SELTZER PLUS PO) Take 1 tablet by mouth daily as needed (congestion). Reported on 11/13/2015    . pregabalin (LYRICA) 75 MG capsule Take 1 capsule (75 mg total) by mouth 2 (two) times daily. (Patient not taking: Reported on 11/13/2015) 60 capsule 1  . promethazine (PHENERGAN) 25 MG tablet Take 25 mg by mouth every 8 (eight) hours as needed for nausea or vomiting.    . sodium chloride 0.9 % infusion     . sulfamethoxazole-trimethoprim (BACTRIM DS,SEPTRA DS) 800-160 MG tablet Take 1 tablet by mouth 2 (two) times daily. 20 tablet 0  . tetracycline (ACHROMYCIN,SUMYCIN) 500 MG capsule TAKE 1 CAPSULE (500 MG TOTAL) BY MOUTH 2 (TWO) TIMES DAILY. (Patient not taking: Reported on 11/13/2015) 28 capsule 0  . TRADJENTA 5 MG TABS tablet Take 5 mg by mouth daily.     . traMADol (ULTRAM) 50 MG tablet TAKE 1 TABLET BY MOUTH EVER 8 HOURS AS NEEDED FOR PAIN CAN FILL 11/25 30 tablet 0  . vancomycin (VANCOCIN) 10 G SOLR injection      Current Facility-Administered Medications on File Prior to Visit  Medication Dose Route Frequency Provider Last Rate  Last Dose  . triamcinolone acetonide (KENALOG) 10 MG/ML injection 10 mg  10 mg Other Once [employer], DPM      . triamcinolone acetonide (KENALOG-40) injection 20 mg  20 mg Other Once Asencion Islam, DPM        Allergies  Allergen Reactions  . Levofloxacin Nausea And Vomiting    Other reaction(s): Confusion    Objective: There were no vitals filed for this visit.  General: No acute distress, AAOx3  Right foot: Mild ecchymosis to the dorsal lateral aspect of the right foot Circular Plantar medial ulceration with budding hyper-granular base measures post excision 2.8x 2.8x 0.3cm filling in with no clear drainage, no erythema, no warmth, no active  drainage, no acute signs of infection noted, Capillary fill time <3 seconds in all digits, gross sensation present via light touch to right foot. There is diffuse swelling to the right foot with no cellulitis or lymphangitis. No pain or crepitation with range of motion right foot however to the lateral aspect. There is slight mild tenderness along the fifth ray. Subjective weakness in legs and recent history of pancreatitis flare with rectal bleeding, 7 weeks ago.  No pain with calf compression.   Assessment and Plan:  Problem List Items Addressed This Visit    None    Visit Diagnoses    Diabetic ulcer of right midfoot associated with type 2 diabetes mellitus, with fat layer exposed (HCC)    -  Primary   Diabetic polyneuropathy associated with type 2 diabetes mellitus (HCC)       Charcot ankle, right       Right foot pain       Weakness of both lower extremities         -Patient seen and evaluated -Discussed swelling and changes to foot, likely secondary to Charcot deformity.  -Continue with Tamadol as needed for pain  -Continue with Augmentin for + staph culture until completed -Continue with Celebrex for chronic inflammation -Continue with topical pain rub of lidocaine to use at right lateral foot -Awaiting ABIs, PVRs the setting of  chronic ulceration -Re-Discussed treatment options and continued care for chronic nonhealing wound. Patient opt to hold off on surgery at this time -Cleansed right foot ulceration and applied Silver nitrate fibrocall pad to hyper- granular areas at right foot ulcer secured with sterile dressings with Ace wrap and stockinet, right foot . Patient should continue with daily dressing changes consisting of 4 x 4, ABDs, Kerlix only. Continue to hold off on PRISMA at this time.  -Continue self PT at home for weakness and nonweightbearing on right lower extremity. May use rolling walker or transport wheelchair to assist with ambulation -Return in 1-2 weeks for ulcer care.   Asencion Islam, DPM

## 2016-06-07 NOTE — Telephone Encounter (Signed)
We will hold off on surgery at this time.  Thank you Dr. Kathie Rhodes

## 2016-06-07 NOTE — Telephone Encounter (Signed)
"  I'm calling about mutual patient Emily Miranda.  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  Clinically she didn't look back but her Diabetes is out of control, mostly because she has been non-compliant.  She rarely comes in for her appointments and she never records her sugars.  The other issue is that her Anemia has gotten worse.  It looks iron deficient.  I suspect she is over due for a Colonoscopy and may have a slow GI bleed.  I have a call into Dr. Debby Bud. She's going to check her records and get back to me on Monday.  If it got to the point where the surgery is top priority we could do a quick pre-op but I think she needs a tune up.  You can call me back as needed.

## 2016-06-14 DIAGNOSIS — I10 Essential (primary) hypertension: Secondary | ICD-10-CM | POA: Diagnosis not present

## 2016-06-16 ENCOUNTER — Other Ambulatory Visit: Payer: Self-pay | Admitting: Sports Medicine

## 2016-06-16 DIAGNOSIS — M79671 Pain in right foot: Secondary | ICD-10-CM

## 2016-06-17 MED ORDER — TRAMADOL HCL 50 MG PO TABS: 50.0000 mg | ORAL_TABLET | Freq: Three times a day (TID) | ORAL | 0 refills | Status: DC | PRN

## 2016-06-17 NOTE — Telephone Encounter (Signed)
We can refill one last time. I will talk to patient when she comes in this week to see if we can attempt to use other alternatives Thanks Dr. Kathie Rhodes

## 2016-06-19 ENCOUNTER — Encounter: Payer: Medicare Other | Admitting: Sports Medicine

## 2016-06-20 ENCOUNTER — Ambulatory Visit (INDEPENDENT_AMBULATORY_CARE_PROVIDER_SITE_OTHER): Payer: Medicare Other | Admitting: Sports Medicine

## 2016-06-20 ENCOUNTER — Encounter: Payer: Self-pay | Admitting: Sports Medicine

## 2016-06-20 DIAGNOSIS — E1142 Type 2 diabetes mellitus with diabetic polyneuropathy: Secondary | ICD-10-CM | POA: Diagnosis not present

## 2016-06-20 DIAGNOSIS — L97411 Non-pressure chronic ulcer of right heel and midfoot limited to breakdown of skin: Secondary | ICD-10-CM

## 2016-06-20 DIAGNOSIS — M79671 Pain in right foot: Secondary | ICD-10-CM

## 2016-06-20 DIAGNOSIS — R29898 Other symptoms and signs involving the musculoskeletal system: Secondary | ICD-10-CM

## 2016-06-20 DIAGNOSIS — M14671 Charcot's joint, right ankle and foot: Secondary | ICD-10-CM | POA: Diagnosis not present

## 2016-06-20 DIAGNOSIS — E08621 Diabetes mellitus due to underlying condition with foot ulcer: Secondary | ICD-10-CM | POA: Diagnosis not present

## 2016-06-20 NOTE — Progress Notes (Signed)
Subjective: Emily Miranda is a 76 y.o. female patient seen today in office for POV #19 (DOS 01-08-16), S/P Right foot wound debridement and placement of stravix allograft and status post, office excision of ulceration 02/29/2016 with pathology revealing traumatized tissue with no bacterial pathogens. Patient states that she is doing good, denies shortness of breath, nausea, vomiting, fever, or chills. No other issues noted.   FBS getting better down from 500 to 200 per patient secondary to pancreatitis and reports that she will have to go to hematologist for her Anemia for possible transfusions.   Patient Active Problem List   Diagnosis Date Noted  . Wound infection 11/13/2015  . Abdominal pain, RLQ (right lower quadrant) -chronic since 2009 06/02/2013  . Constipation, chronic 06/02/2013  . SUI (stress urinary incontinence, female) 06/02/2013  . Recurrent UTI (urinary tract infection) 06/02/2013  . HTN (hypertension)   . CHEST PAIN 11/27/2009  . OVERWEIGHT 11/28/2008  . ESSENTIAL HYPERTENSION, BENIGN 11/28/2008  . DM 11/25/2008  . AORTIC STENOSIS 11/25/2008  . PANCREATITIS, HX OF 11/25/2008    Current Outpatient Prescriptions on File Prior to Visit  Medication Sig Dispense Refill  . amLODipine (NORVASC) 5 MG tablet Take 5 mg by mouth daily.     Marland Kitchen amoxicillin (AMOXIL) 875 MG tablet     . amoxicillin-clavulanate (AUGMENTIN) 875-125 MG tablet Take 1 tablet by mouth 2 (two) times daily. 28 tablet 0  . Ascorbic Acid (VITAMIN C) 100 MG tablet Take 100 mg by mouth daily.      . celecoxib (CELEBREX) 400 MG capsule Take 1 capsule (400 mg total) by mouth daily after breakfast. 30 capsule 0  . cephALEXin (KEFLEX) 500 MG capsule Take 1 capsule (500 mg total) by mouth 2 (two) times daily. (Patient not taking: Reported on 11/13/2015) 28 capsule 0  . chlorthalidone (HYGROTON) 25 MG tablet TAKE 0.5 TABLETS (12.5 MG TOTAL) BY MOUTH DAILY. 15 tablet 1  . diphenoxylate-atropine (LOMOTIL) 2.5-0.025 MG  tablet Take 1 tablet by mouth 4 (four) times daily as needed for diarrhea or loose stools. 30 tablet 0  . docusate sodium (COLACE) 100 MG capsule TAKE 1 TABLET BY MOUTH EVERY 8 HOURS, AS NEEDED FOR CONSTIPATION 30 capsule 0  . ertapenem 1 g in sodium chloride 0.9 % 50 mL Inject 1 g into the vein daily. 13 Syringe 0  . fexofenadine (ALLEGRA) 180 MG tablet Take 180 mg by mouth daily.  5  . fluconazole (DIFLUCAN) 100 MG tablet Take two tablets daily for 14 days. 28 tablet 0  . fluticasone (FLONASE) 50 MCG/ACT nasal spray     . furosemide (LASIX) 20 MG tablet Take 20 mg by mouth daily. Reported on 11/13/2015    . gabapentin (NEURONTIN) 300 MG capsule Take 1 capsule (300 mg total) by mouth at bedtime. 90 capsule 3  . glimepiride (AMARYL) 4 MG tablet Take 4 mg by mouth daily with breakfast.    . HYDROcodone-acetaminophen (NORCO) 10-325 MG tablet Take 1 tablet by mouth every 6 (six) hours as needed.    Marland Kitchen HYDROcodone-acetaminophen (NORCO) 5-325 MG per tablet Take 1 tablet by mouth every 6 (six) hours as needed. 10 tablet 0  . HYDROcodone-acetaminophen (NORCO/VICODIN) 5-325 MG tablet Take by mouth.    Pincus Sanes 1 g injection     . lidocaine (XYLOCAINE) 5 % ointment Apply 1 application topically as needed. 50 g 2  . linagliptin (TRADJENTA) 5 MG TABS tablet Take by mouth.    . linezolid (ZYVOX) 600 MG/300ML IVPB Inject 300  mLs (600 mg total) into the vein every 12 (twelve) hours. (Patient not taking: Reported on 11/13/2015) 100 mL 23  . lisinopril (PRINIVIL,ZESTRIL) 10 MG tablet Take 10 mg by mouth daily.    Marland Kitchen lisinopril (PRINIVIL,ZESTRIL) 30 MG tablet     . lubiprostone (AMITIZA) 8 MCG capsule TAKE 1 CAPSULE (8 MCG TOTAL) BY MOUTH 2 (TWO) TIMES A DAY WITH MEALS.    . methocarbamol (ROBAXIN) 500 MG tablet Take 1.5 tablets (750 mg total) by mouth every 8 (eight) hours as needed for muscle spasms (use for muscle cramps/pain). (Patient not taking: Reported on 11/13/2015) 30 tablet 2  . naproxen (NAPROSYN) 500 MG  tablet Take 1 tablet (500 mg total) by mouth 2 (two) times daily with a meal. (Patient not taking: Reported on 11/13/2015) 40 tablet 1  . nitrofurantoin (MACRODANTIN) 50 MG capsule     . nitrofurantoin, macrocrystal-monohydrate, (MACROBID) 100 MG capsule Take 100 mg by mouth 2 (two) times daily.     . nitroGLYCERIN (NITRODUR - DOSED IN MG/24 HR) 0.1 mg/hr patch Frequency:UNKNOWN   Dosage:0.0     Instructions:  Note:Dose: .    . omeprazole (PRILOSEC) 20 MG capsule     . omeprazole (PRILOSEC) 20 MG capsule TAKE 1 CAPSULE BY MOUTH IN THE MORNING    . ONE TOUCH ULTRA TEST test strip 1 each by Other route once a week.     Marland Kitchen oxyCODONE-acetaminophen (PERCOCET/ROXICET) 5-325 MG tablet Take 1-2 tablets PO Q4H PRN pain    . Phenyleph-Doxylamine-DM-APAP (ALKA SELTZER PLUS PO) Take 1 tablet by mouth daily as needed (congestion). Reported on 11/13/2015    . pregabalin (LYRICA) 75 MG capsule Take 1 capsule (75 mg total) by mouth 2 (two) times daily. (Patient not taking: Reported on 11/13/2015) 60 capsule 1  . promethazine (PHENERGAN) 25 MG tablet Take 25 mg by mouth every 8 (eight) hours as needed for nausea or vomiting.    . sodium chloride 0.9 % infusion     . sulfamethoxazole-trimethoprim (BACTRIM DS,SEPTRA DS) 800-160 MG tablet Take 1 tablet by mouth 2 (two) times daily. 20 tablet 0  . tetracycline (ACHROMYCIN,SUMYCIN) 500 MG capsule TAKE 1 CAPSULE (500 MG TOTAL) BY MOUTH 2 (TWO) TIMES DAILY. (Patient not taking: Reported on 11/13/2015) 28 capsule 0  . TRADJENTA 5 MG TABS tablet Take 5 mg by mouth daily.     . traMADol (ULTRAM) 50 MG tablet TAKE 1 TABLET BY MOUTH EVERY 8 HOURS AS NEEDED FOR PAIN 30 tablet 0  . traMADol (ULTRAM) 50 MG tablet Take 1 tablet (50 mg total) by mouth every 8 (eight) hours as needed. 30 tablet 0  . vancomycin (VANCOCIN) 10 G SOLR injection      Current Facility-Administered Medications on File Prior to Visit  Medication Dose Route Frequency Provider Last Rate Last Dose  . triamcinolone  acetonide (KENALOG) 10 MG/ML injection 10 mg  10 mg Other Once [employer], DPM      . triamcinolone acetonide (KENALOG-40) injection 20 mg  20 mg Other Once Asencion Islam, DPM        Allergies  Allergen Reactions  . Levofloxacin Nausea And Vomiting    Other reaction(s): Confusion    Objective: There were no vitals filed for this visit.  General: No acute distress, AAOx3  Right foot: Mild ecchymosis to the dorsal lateral aspect of the right foot, there is a Circular Plantar medial ulceration with budding hyper-granular base measures post excision 2x 2x 0.2cm filling in with no clear drainage, no erythema,  no warmth, no active drainage, no acute signs of infection noted, Capillary fill time <3 seconds in all digits, gross sensation present via light touch to right foot. There is diffuse swelling to the right foot with no cellulitis or lymphangitis. No pain or crepitation with range of motion right foot however to the lateral aspect. There is decreased tenderness along the fifth ray. Subjective weakness in legs and recent history of pancreatitis flare with rectal bleeding, 9 weeks ago.  No pain with calf compression.   Assessment and Plan:  Problem List Items Addressed This Visit    None    Visit Diagnoses    Diabetic ulcer of right midfoot associated with diabetes mellitus due to underlying condition, limited to breakdown of skin (HCC)    -  Primary   Diabetic polyneuropathy associated with type 2 diabetes mellitus (HCC)       Charcot ankle, right       Right foot pain       Weakness of both lower extremities         -Patient seen and evaluated -Re-Discussed swelling and changes to foot, likely secondary to Charcot deformity with now resolving acute event.  -Continue with Tamadol as needed for pain  -Augmentin completed  -Continue with topical pain rub of lidocaine to use at right lateral foot -Awaiting ABIs, PVRs the setting of chronic ulceration -Re-Discussed treatment options  and continued care for chronic nonhealing wound. Patient opt to continue to hold off on surgery at this time and I agree since wound is improving -Cleansed right foot ulceration and applied Silver nitrate fibrocall pad to hyper- granular areas at right foot ulcer secured with 4x4 guaze and paper tape; patient to change dressing daily with dry guaze and tape  -Continue self PT at home for weakness and nonweightbearing on right lower extremity. May use rolling walker. Office to follow up on transport wheelchair order that was sent to PT prior to assist with ambulation -Return in 1-2 weeks for continued ulcer care.   Asencion Islam, DPM

## 2016-06-21 ENCOUNTER — Telehealth: Payer: Self-pay | Admitting: *Deleted

## 2016-06-21 NOTE — Telephone Encounter (Signed)
Entered in error

## 2016-06-25 DIAGNOSIS — I10 Essential (primary) hypertension: Secondary | ICD-10-CM | POA: Diagnosis not present

## 2016-06-25 DIAGNOSIS — N289 Disorder of kidney and ureter, unspecified: Secondary | ICD-10-CM | POA: Diagnosis not present

## 2016-06-25 DIAGNOSIS — D649 Anemia, unspecified: Secondary | ICD-10-CM | POA: Diagnosis not present

## 2016-06-25 DIAGNOSIS — E119 Type 2 diabetes mellitus without complications: Secondary | ICD-10-CM | POA: Diagnosis not present

## 2016-06-27 ENCOUNTER — Encounter: Payer: Medicare Other | Admitting: Sports Medicine

## 2016-06-28 DIAGNOSIS — L97412 Non-pressure chronic ulcer of right heel and midfoot with fat layer exposed: Secondary | ICD-10-CM | POA: Diagnosis not present

## 2016-06-28 DIAGNOSIS — M14671 Charcot's joint, right ankle and foot: Secondary | ICD-10-CM | POA: Diagnosis not present

## 2016-06-28 DIAGNOSIS — E11621 Type 2 diabetes mellitus with foot ulcer: Secondary | ICD-10-CM | POA: Diagnosis not present

## 2016-06-28 DIAGNOSIS — M79671 Pain in right foot: Secondary | ICD-10-CM | POA: Diagnosis not present

## 2016-06-28 NOTE — Telephone Encounter (Signed)
Thanks. Please let patient know as well -Dr. Marylene Land

## 2016-07-02 DIAGNOSIS — D631 Anemia in chronic kidney disease: Secondary | ICD-10-CM | POA: Diagnosis not present

## 2016-07-02 DIAGNOSIS — N189 Chronic kidney disease, unspecified: Secondary | ICD-10-CM | POA: Diagnosis not present

## 2016-07-02 DIAGNOSIS — I10 Essential (primary) hypertension: Secondary | ICD-10-CM | POA: Diagnosis not present

## 2016-07-02 DIAGNOSIS — E119 Type 2 diabetes mellitus without complications: Secondary | ICD-10-CM | POA: Diagnosis not present

## 2016-07-03 DIAGNOSIS — D649 Anemia, unspecified: Secondary | ICD-10-CM | POA: Diagnosis not present

## 2016-07-04 ENCOUNTER — Ambulatory Visit (INDEPENDENT_AMBULATORY_CARE_PROVIDER_SITE_OTHER): Payer: Medicare Other | Admitting: Sports Medicine

## 2016-07-04 ENCOUNTER — Encounter: Payer: Self-pay | Admitting: Sports Medicine

## 2016-07-04 DIAGNOSIS — M7989 Other specified soft tissue disorders: Secondary | ICD-10-CM | POA: Diagnosis not present

## 2016-07-04 DIAGNOSIS — L97411 Non-pressure chronic ulcer of right heel and midfoot limited to breakdown of skin: Secondary | ICD-10-CM

## 2016-07-04 DIAGNOSIS — E08621 Diabetes mellitus due to underlying condition with foot ulcer: Secondary | ICD-10-CM

## 2016-07-04 DIAGNOSIS — E1142 Type 2 diabetes mellitus with diabetic polyneuropathy: Secondary | ICD-10-CM

## 2016-07-04 DIAGNOSIS — M14671 Charcot's joint, right ankle and foot: Secondary | ICD-10-CM

## 2016-07-04 DIAGNOSIS — M79671 Pain in right foot: Secondary | ICD-10-CM

## 2016-07-04 MED ORDER — TRIAMCINOLONE ACETONIDE 10 MG/ML IJ SUSP: 10.0000 mg | Freq: Once | INTRAMUSCULAR | Status: DC

## 2016-07-04 NOTE — Progress Notes (Signed)
Subjective: Emily Miranda is a 76 y.o. female patient seen today in office for POV #20 (DOS 01-08-16), S/P Right foot wound debridement and placement of stravix allograft and status post, office excision of ulceration 02/29/2016 with pathology revealing traumatized tissue with no bacterial pathogens. Patient states that she is have pain at top of right foot and pain and swelling in calf, denies shortness of breath, nausea, vomiting, fever, or chills. No other issues noted.   Reports she went to hematologist who reports that her Anemia may be secondary to her Kidneys.   Patient Active Problem List   Diagnosis Date Noted  . Wound infection 11/13/2015  . Abdominal pain, RLQ (right lower quadrant) -chronic since 2009 06/02/2013  . Constipation, chronic 06/02/2013  . SUI (stress urinary incontinence, female) 06/02/2013  . Recurrent UTI (urinary tract infection) 06/02/2013  . HTN (hypertension)   . CHEST PAIN 11/27/2009  . OVERWEIGHT 11/28/2008  . ESSENTIAL HYPERTENSION, BENIGN 11/28/2008  . DM 11/25/2008  . AORTIC STENOSIS 11/25/2008  . PANCREATITIS, HX OF 11/25/2008    Current Outpatient Prescriptions on File Prior to Visit  Medication Sig Dispense Refill  . amLODipine (NORVASC) 5 MG tablet Take 5 mg by mouth daily.     Marland Kitchen amoxicillin (AMOXIL) 875 MG tablet     . amoxicillin-clavulanate (AUGMENTIN) 875-125 MG tablet Take 1 tablet by mouth 2 (two) times daily. 28 tablet 0  . Ascorbic Acid (VITAMIN C) 100 MG tablet Take 100 mg by mouth daily.      . celecoxib (CELEBREX) 400 MG capsule Take 1 capsule (400 mg total) by mouth daily after breakfast. 30 capsule 0  . cephALEXin (KEFLEX) 500 MG capsule Take 1 capsule (500 mg total) by mouth 2 (two) times daily. (Patient not taking: Reported on 11/13/2015) 28 capsule 0  . chlorthalidone (HYGROTON) 25 MG tablet TAKE 0.5 TABLETS (12.5 MG TOTAL) BY MOUTH DAILY. 15 tablet 1  . diphenoxylate-atropine (LOMOTIL) 2.5-0.025 MG tablet Take 1 tablet by mouth  4 (four) times daily as needed for diarrhea or loose stools. 30 tablet 0  . docusate sodium (COLACE) 100 MG capsule TAKE 1 TABLET BY MOUTH EVERY 8 HOURS, AS NEEDED FOR CONSTIPATION 30 capsule 0  . ertapenem 1 g in sodium chloride 0.9 % 50 mL Inject 1 g into the vein daily. 13 Syringe 0  . fexofenadine (ALLEGRA) 180 MG tablet Take 180 mg by mouth daily.  5  . fluconazole (DIFLUCAN) 100 MG tablet Take two tablets daily for 14 days. 28 tablet 0  . fluticasone (FLONASE) 50 MCG/ACT nasal spray     . furosemide (LASIX) 20 MG tablet Take 20 mg by mouth daily. Reported on 11/13/2015    . gabapentin (NEURONTIN) 300 MG capsule Take 1 capsule (300 mg total) by mouth at bedtime. 90 capsule 3  . glimepiride (AMARYL) 4 MG tablet Take 4 mg by mouth daily with breakfast.    . HYDROcodone-acetaminophen (NORCO) 10-325 MG tablet Take 1 tablet by mouth every 6 (six) hours as needed.    Marland Kitchen HYDROcodone-acetaminophen (NORCO) 5-325 MG per tablet Take 1 tablet by mouth every 6 (six) hours as needed. 10 tablet 0  . HYDROcodone-acetaminophen (NORCO/VICODIN) 5-325 MG tablet Take by mouth.    Pincus Sanes 1 g injection     . lidocaine (XYLOCAINE) 5 % ointment Apply 1 application topically as needed. 50 g 2  . linagliptin (TRADJENTA) 5 MG TABS tablet Take by mouth.    . linezolid (ZYVOX) 600 MG/300ML IVPB Inject 300 mLs (600  mg total) into the vein every 12 (twelve) hours. (Patient not taking: Reported on 11/13/2015) 100 mL 23  . lisinopril (PRINIVIL,ZESTRIL) 10 MG tablet Take 10 mg by mouth daily.    Marland Kitchen lisinopril (PRINIVIL,ZESTRIL) 30 MG tablet     . lubiprostone (AMITIZA) 8 MCG capsule TAKE 1 CAPSULE (8 MCG TOTAL) BY MOUTH 2 (TWO) TIMES A DAY WITH MEALS.    . methocarbamol (ROBAXIN) 500 MG tablet Take 1.5 tablets (750 mg total) by mouth every 8 (eight) hours as needed for muscle spasms (use for muscle cramps/pain). (Patient not taking: Reported on 11/13/2015) 30 tablet 2  . naproxen (NAPROSYN) 500 MG tablet Take 1 tablet (500 mg  total) by mouth 2 (two) times daily with a meal. (Patient not taking: Reported on 11/13/2015) 40 tablet 1  . nitrofurantoin (MACRODANTIN) 50 MG capsule     . nitrofurantoin, macrocrystal-monohydrate, (MACROBID) 100 MG capsule Take 100 mg by mouth 2 (two) times daily.     . nitroGLYCERIN (NITRODUR - DOSED IN MG/24 HR) 0.1 mg/hr patch Frequency:UNKNOWN   Dosage:0.0     Instructions:  Note:Dose: .    . omeprazole (PRILOSEC) 20 MG capsule     . omeprazole (PRILOSEC) 20 MG capsule TAKE 1 CAPSULE BY MOUTH IN THE MORNING    . ONE TOUCH ULTRA TEST test strip 1 each by Other route once a week.     Marland Kitchen oxyCODONE-acetaminophen (PERCOCET/ROXICET) 5-325 MG tablet Take 1-2 tablets PO Q4H PRN pain    . Phenyleph-Doxylamine-DM-APAP (ALKA SELTZER PLUS PO) Take 1 tablet by mouth daily as needed (congestion). Reported on 11/13/2015    . pregabalin (LYRICA) 75 MG capsule Take 1 capsule (75 mg total) by mouth 2 (two) times daily. (Patient not taking: Reported on 11/13/2015) 60 capsule 1  . promethazine (PHENERGAN) 25 MG tablet Take 25 mg by mouth every 8 (eight) hours as needed for nausea or vomiting.    . sodium chloride 0.9 % infusion     . sulfamethoxazole-trimethoprim (BACTRIM DS,SEPTRA DS) 800-160 MG tablet Take 1 tablet by mouth 2 (two) times daily. 20 tablet 0  . tetracycline (ACHROMYCIN,SUMYCIN) 500 MG capsule TAKE 1 CAPSULE (500 MG TOTAL) BY MOUTH 2 (TWO) TIMES DAILY. (Patient not taking: Reported on 11/13/2015) 28 capsule 0  . TRADJENTA 5 MG TABS tablet Take 5 mg by mouth daily.     . traMADol (ULTRAM) 50 MG tablet TAKE 1 TABLET BY MOUTH EVERY 8 HOURS AS NEEDED FOR PAIN 30 tablet 0  . traMADol (ULTRAM) 50 MG tablet Take 1 tablet (50 mg total) by mouth every 8 (eight) hours as needed. 30 tablet 0  . vancomycin (VANCOCIN) 10 G SOLR injection      Current Facility-Administered Medications on File Prior to Visit  Medication Dose Route Frequency Provider Last Rate Last Dose  . triamcinolone acetonide (KENALOG) 10 MG/ML  injection 10 mg  10 mg Other Once [employer], DPM      . triamcinolone acetonide (KENALOG-40) injection 20 mg  20 mg Other Once Asencion Islam, DPM        Allergies  Allergen Reactions  . Levofloxacin Nausea And Vomiting    Other reaction(s): Confusion    Objective: There were no vitals filed for this visit.  General: No acute distress, AAOx3  Right foot: Mild ecchymosis to the dorsal lateral aspect of the right foot, there is a Circular Plantar medial ulceration with budding hyper-granular base measures post excision 1.5x1.5x 0.2cm filling in with no clear drainage, no erythema, no warmth, no  active drainage, no acute signs of infection noted, There is focal pain to right calf with swelling and warmth and pain to dorsal forefoot on right, Capillary fill time <3 seconds in all digits, gross sensation present via light touch to right foot. There is diffuse swelling to the right foot with no cellulitis or lymphangitis. No pain or crepitation with range of motion right foot however to the lateral aspect. There is decreased tenderness along the fifth ray. Subjective weakness in legs and recent history of pancreatitis flare with rectal bleeding, 9 weeks ago.  No pain with calf compression.   Assessment and Plan:  Problem List Items Addressed This Visit    None    Visit Diagnoses    Diabetic ulcer of right midfoot associated with diabetes mellitus due to underlying condition, limited to breakdown of skin (HCC)    -  Primary   Diabetic polyneuropathy associated with type 2 diabetes mellitus (HCC)       Charcot ankle, right       Right foot pain       Relevant Medications   triamcinolone acetonide (KENALOG) 10 MG/ML injection 10 mg (Start on 07/04/2016  9:00 PM)   Swelling of calf         -Patient seen and evaluated -Re-Discussed swelling and changes to foot, likely secondary to Charcot deformity with now resolving acute event and possible new onset concern for DVT -Ordered SAT venous  duplux r/o DVT on right and told patient to prophylatic start full dose aspirin  -After oral consent and aseptic prep, injected a mixture containing 1 ml of 2%  plain lidocaine, 1 ml 0.5% plain marcaine, 0.5 ml of kenalog 10 and 0.5 ml of dexamethasone phosphate into right forefoot without complication. Post-injection care discussed with patient.  -Dispensed surgi-tube stockinet -Continue with Tamadol as needed for pain -Continue with topical pain rub of lidocaine to right as needed for pain  -Awaiting ABIs, PVRs the setting of chronic ulceration -Re-Discussed treatment options and continued care for chronic nonhealing wound. Patient opt to continue to hold off on surgery at this time and I agree since wound is improving -Cleansed right foot ulceration and applied Silver nitrate fibrocall pad to hyper- granular areas at right foot ulcer secured with 4x4 guaze and paper tape; patient to change dressing daily with dry guaze and tape  -Continue self PT at home for weakness and nonweightbearing on right lower extremity. May use rolling walker and transport wheelchair -Return in 1-2 weeks for continued ulcer care.   Asencion Islam, DPM

## 2016-07-05 ENCOUNTER — Encounter: Payer: Self-pay | Admitting: Sports Medicine

## 2016-07-05 DIAGNOSIS — R252 Cramp and spasm: Secondary | ICD-10-CM | POA: Diagnosis not present

## 2016-07-05 DIAGNOSIS — M79661 Pain in right lower leg: Secondary | ICD-10-CM | POA: Diagnosis not present

## 2016-07-05 DIAGNOSIS — R609 Edema, unspecified: Secondary | ICD-10-CM | POA: Diagnosis not present

## 2016-07-06 ENCOUNTER — Other Ambulatory Visit: Payer: Self-pay | Admitting: Sports Medicine

## 2016-07-06 DIAGNOSIS — M79671 Pain in right foot: Secondary | ICD-10-CM

## 2016-07-08 ENCOUNTER — Other Ambulatory Visit: Payer: Self-pay | Admitting: Cardiology

## 2016-07-09 ENCOUNTER — Telehealth: Payer: Self-pay | Admitting: *Deleted

## 2016-07-09 DIAGNOSIS — L97512 Non-pressure chronic ulcer of other part of right foot with fat layer exposed: Secondary | ICD-10-CM | POA: Diagnosis not present

## 2016-07-09 DIAGNOSIS — L97511 Non-pressure chronic ulcer of other part of right foot limited to breakdown of skin: Secondary | ICD-10-CM | POA: Diagnosis not present

## 2016-07-09 NOTE — Telephone Encounter (Addendum)
-----   Message from Asencion Islam, [place] sent at 07/08/2016  6:01 PM EST ----- Yes 1 last refilled. I talked to her in office last week about only refilling it once more Thanks Dr. Marylene Land ----- Message ----- From: Marissa Nestle, RN Sent: 07/08/2016  10:39 AM To: Asencion Islam, DPM  Dr. Marylene Land, do you want to refill the Tramadol? Valery. Refill called to [phone].

## 2016-07-18 ENCOUNTER — Ambulatory Visit (INDEPENDENT_AMBULATORY_CARE_PROVIDER_SITE_OTHER): Payer: Medicare Other | Admitting: Sports Medicine

## 2016-07-18 ENCOUNTER — Encounter: Payer: Self-pay | Admitting: Sports Medicine

## 2016-07-18 DIAGNOSIS — E08621 Diabetes mellitus due to underlying condition with foot ulcer: Secondary | ICD-10-CM | POA: Diagnosis not present

## 2016-07-18 DIAGNOSIS — M14671 Charcot's joint, right ankle and foot: Secondary | ICD-10-CM | POA: Diagnosis not present

## 2016-07-18 DIAGNOSIS — L97411 Non-pressure chronic ulcer of right heel and midfoot limited to breakdown of skin: Secondary | ICD-10-CM

## 2016-07-18 DIAGNOSIS — M79671 Pain in right foot: Secondary | ICD-10-CM

## 2016-07-18 DIAGNOSIS — E1142 Type 2 diabetes mellitus with diabetic polyneuropathy: Secondary | ICD-10-CM | POA: Diagnosis not present

## 2016-07-18 NOTE — Progress Notes (Signed)
Subjective: Emily Miranda is a 76 y.o. female patient seen today in office for POV #20 (DOS 01-08-16), S/P Right foot wound debridement and placement of stravix allograft and status post, office excision of ulceration 02/29/2016 with pathology revealing traumatized tissue with no bacterial pathogens. Patient states that the pain is better however her leg is still swollen R>L, denies shortness of breath, nausea, vomiting, fever, or chills. No other issues noted.   Reports her blood sugars are getting better and that she has an appointment with her Cardiologist on Tuesday.  Patient Active Problem List   Diagnosis Date Noted  . Wound infection 11/13/2015  . Abdominal pain, RLQ (right lower quadrant) -chronic since 2009 06/02/2013  . Constipation, chronic 06/02/2013  . SUI (stress urinary incontinence, female) 06/02/2013  . Recurrent UTI (urinary tract infection) 06/02/2013  . HTN (hypertension)   . CHEST PAIN 11/27/2009  . OVERWEIGHT 11/28/2008  . ESSENTIAL HYPERTENSION, BENIGN 11/28/2008  . DM 11/25/2008  . AORTIC STENOSIS 11/25/2008  . PANCREATITIS, HX OF 11/25/2008    Current Outpatient Prescriptions on File Prior to Visit  Medication Sig Dispense Refill  . amLODipine (NORVASC) 5 MG tablet Take 5 mg by mouth daily.     Marland Kitchen amoxicillin (AMOXIL) 875 MG tablet     . amoxicillin-clavulanate (AUGMENTIN) 875-125 MG tablet Take 1 tablet by mouth 2 (two) times daily. 28 tablet 0  . Ascorbic Acid (VITAMIN C) 100 MG tablet Take 100 mg by mouth daily.      . celecoxib (CELEBREX) 400 MG capsule Take 1 capsule (400 mg total) by mouth daily after breakfast. 30 capsule 0  . cephALEXin (KEFLEX) 500 MG capsule Take 1 capsule (500 mg total) by mouth 2 (two) times daily. (Patient not taking: Reported on 11/13/2015) 28 capsule 0  . chlorthalidone (HYGROTON) 25 MG tablet TAKE 1/2 TABLETS BY MOUTH DAILY. 45 tablet 0  . diphenoxylate-atropine (LOMOTIL) 2.5-0.025 MG tablet Take 1 tablet by mouth 4 (four)  times daily as needed for diarrhea or loose stools. 30 tablet 0  . docusate sodium (COLACE) 100 MG capsule TAKE 1 TABLET BY MOUTH EVERY 8 HOURS, AS NEEDED FOR CONSTIPATION 30 capsule 0  . ertapenem 1 g in sodium chloride 0.9 % 50 mL Inject 1 g into the vein daily. 13 Syringe 0  . fexofenadine (ALLEGRA) 180 MG tablet Take 180 mg by mouth daily.  5  . fluconazole (DIFLUCAN) 100 MG tablet Take two tablets daily for 14 days. 28 tablet 0  . fluticasone (FLONASE) 50 MCG/ACT nasal spray     . furosemide (LASIX) 20 MG tablet Take 20 mg by mouth daily. Reported on 11/13/2015    . gabapentin (NEURONTIN) 300 MG capsule Take 1 capsule (300 mg total) by mouth at bedtime. 90 capsule 3  . glimepiride (AMARYL) 4 MG tablet Take 4 mg by mouth daily with breakfast.    . HYDROcodone-acetaminophen (NORCO) 10-325 MG tablet Take 1 tablet by mouth every 6 (six) hours as needed.    Marland Kitchen HYDROcodone-acetaminophen (NORCO) 5-325 MG per tablet Take 1 tablet by mouth every 6 (six) hours as needed. 10 tablet 0  . HYDROcodone-acetaminophen (NORCO/VICODIN) 5-325 MG tablet Take by mouth.    Pincus Sanes 1 g injection     . lidocaine (XYLOCAINE) 5 % ointment Apply 1 application topically as needed. 50 g 2  . linagliptin (TRADJENTA) 5 MG TABS tablet Take by mouth.    . linezolid (ZYVOX) 600 MG/300ML IVPB Inject 300 mLs (600 mg total) into the vein every  12 (twelve) hours. (Patient not taking: Reported on 11/13/2015) 100 mL 23  . lisinopril (PRINIVIL,ZESTRIL) 10 MG tablet Take 10 mg by mouth daily.    Marland Kitchen lisinopril (PRINIVIL,ZESTRIL) 30 MG tablet     . lubiprostone (AMITIZA) 8 MCG capsule TAKE 1 CAPSULE (8 MCG TOTAL) BY MOUTH 2 (TWO) TIMES A DAY WITH MEALS.    . methocarbamol (ROBAXIN) 500 MG tablet Take 1.5 tablets (750 mg total) by mouth every 8 (eight) hours as needed for muscle spasms (use for muscle cramps/pain). (Patient not taking: Reported on 11/13/2015) 30 tablet 2  . naproxen (NAPROSYN) 500 MG tablet Take 1 tablet (500 mg total) by  mouth 2 (two) times daily with a meal. (Patient not taking: Reported on 11/13/2015) 40 tablet 1  . nitrofurantoin (MACRODANTIN) 50 MG capsule     . nitrofurantoin, macrocrystal-monohydrate, (MACROBID) 100 MG capsule Take 100 mg by mouth 2 (two) times daily.     . nitroGLYCERIN (NITRODUR - DOSED IN MG/24 HR) 0.1 mg/hr patch Frequency:UNKNOWN   Dosage:0.0     Instructions:  Note:Dose: .    . omeprazole (PRILOSEC) 20 MG capsule     . omeprazole (PRILOSEC) 20 MG capsule TAKE 1 CAPSULE BY MOUTH IN THE MORNING    . ONE TOUCH ULTRA TEST test strip 1 each by Other route once a week.     Marland Kitchen oxyCODONE-acetaminophen (PERCOCET/ROXICET) 5-325 MG tablet Take 1-2 tablets PO Q4H PRN pain    . Phenyleph-Doxylamine-DM-APAP (ALKA SELTZER PLUS PO) Take 1 tablet by mouth daily as needed (congestion). Reported on 11/13/2015    . pregabalin (LYRICA) 75 MG capsule Take 1 capsule (75 mg total) by mouth 2 (two) times daily. (Patient not taking: Reported on 11/13/2015) 60 capsule 1  . promethazine (PHENERGAN) 25 MG tablet Take 25 mg by mouth every 8 (eight) hours as needed for nausea or vomiting.    . sodium chloride 0.9 % infusion     . sulfamethoxazole-trimethoprim (BACTRIM DS,SEPTRA DS) 800-160 MG tablet Take 1 tablet by mouth 2 (two) times daily. 20 tablet 0  . tetracycline (ACHROMYCIN,SUMYCIN) 500 MG capsule TAKE 1 CAPSULE (500 MG TOTAL) BY MOUTH 2 (TWO) TIMES DAILY. (Patient not taking: Reported on 11/13/2015) 28 capsule 0  . TRADJENTA 5 MG TABS tablet Take 5 mg by mouth daily.     . traMADol (ULTRAM) 50 MG tablet Take 1 tablet (50 mg total) by mouth every 8 (eight) hours as needed. 30 tablet 0  . traMADol (ULTRAM) 50 MG tablet TAKE 1 TABLET EVERY 8 HOURS AS NEEDED FOR FOOT PAIN 50 tablet 0  . vancomycin (VANCOCIN) 10 G SOLR injection      Current Facility-Administered Medications on File Prior to Visit  Medication Dose Route Frequency Provider Last Rate Last Dose  . triamcinolone acetonide (KENALOG) 10 MG/ML injection 10 mg   10 mg Other Once [employer], DPM      . triamcinolone acetonide (KENALOG) 10 MG/ML injection 10 mg  10 mg Other Once [employer], DPM      . triamcinolone acetonide (KENALOG-40) injection 20 mg  20 mg Other Once Asencion Islam, DPM        Allergies  Allergen Reactions  . Levofloxacin Nausea And Vomiting    Other reaction(s): Confusion    Objective: There were no vitals filed for this visit.  General: No acute distress, AAOx3  Right foot: Resolved ecchymosis to the dorsal lateral aspect of the right foot, there is a Circular Plantar medial ulceration with budding hyper-granular base measures  post excision 1x0.8x 0.2cm filling in with minimal clear drainage, no erythema, no warmth, no active drainage, no acute signs of infection noted, There is decreased focal pain to right calf with swelling and warmth and decreased pain to dorsal forefoot on right, Capillary fill time <3 seconds in all digits, gross sensation present via light touch to right foot. There is diffuse swelling to the right foot with no cellulitis or lymphangitis. No pain or crepitation with range of motion right foot however to the lateral aspect. There is decreased tenderness along the fifth ray. Subjective weakness in legs and recent history of pancreatitis flare with rectal bleeding, 12 weeks ago.  No pain with calf compression.   Assessment and Plan:  Problem List Items Addressed This Visit    None    Visit Diagnoses    Diabetic ulcer of right midfoot associated with diabetes mellitus due to underlying condition, limited to breakdown of skin (HCC)    -  Primary   Diabetic polyneuropathy associated with type 2 diabetes mellitus (HCC)       Charcot ankle, right       Right foot pain         -Patient seen and evaluated -Re-Discussed swelling and changes to foot, likely secondary to Charcot deformity with now resolving acute event and possibly secondary to CKD; Advised to discuss with PCP or Cardiologist diuretic  options  -Continue with Tamadol as needed for pain -Continue with topical pain rub of lidocaine to right as needed for pain  -Re-Discussed treatment options and continued care for chronic Slow healing wound. Patient opt to continue to hold off on surgery at this time and I agree since wound is improving -Cleansed right foot ulceration and applied Silver nitrate fibrocall pad to hyper- granular areas at right foot ulcer secured with 4x4 guaze and paper tape; patient to change dressing daily with dry guaze and tape  -Continue self PT at home for weakness and nonweightbearing on right lower extremity. May use rolling walker and transport wheelchair -Return in 1-2 weeks for continued ulcer care.   Asencion Islam, DPM

## 2016-07-22 ENCOUNTER — Telehealth: Payer: Self-pay | Admitting: Cardiology

## 2016-07-22 NOTE — Telephone Encounter (Signed)
Received records from [facility] for appointment on 07/23/16 with Dr Antoine Poche.  Records put with Dr Hochrein's schedule for 07/23/16. lp

## 2016-07-23 ENCOUNTER — Ambulatory Visit: Payer: Medicare Other | Admitting: Cardiology

## 2016-07-26 DIAGNOSIS — M79671 Pain in right foot: Secondary | ICD-10-CM | POA: Diagnosis not present

## 2016-07-26 DIAGNOSIS — M14671 Charcot's joint, right ankle and foot: Secondary | ICD-10-CM | POA: Diagnosis not present

## 2016-07-26 DIAGNOSIS — L97412 Non-pressure chronic ulcer of right heel and midfoot with fat layer exposed: Secondary | ICD-10-CM | POA: Diagnosis not present

## 2016-07-26 DIAGNOSIS — E11621 Type 2 diabetes mellitus with foot ulcer: Secondary | ICD-10-CM | POA: Diagnosis not present

## 2016-07-29 ENCOUNTER — Emergency Department (HOSPITAL_COMMUNITY): Payer: Medicare Other

## 2016-07-29 ENCOUNTER — Encounter (HOSPITAL_COMMUNITY): Payer: Self-pay | Admitting: Emergency Medicine

## 2016-07-29 ENCOUNTER — Telehealth: Payer: Self-pay | Admitting: Cardiology

## 2016-07-29 ENCOUNTER — Inpatient Hospital Stay (HOSPITAL_COMMUNITY)
Admission: EM | Admit: 2016-07-29 | Discharge: 2016-08-05 | DRG: 291 | Disposition: A | Discharge status: Home / Self Care | Payer: Medicare Other | Attending: Family Medicine | Admitting: Family Medicine

## 2016-07-29 DIAGNOSIS — R072 Precordial pain: Secondary | ICD-10-CM

## 2016-07-29 DIAGNOSIS — N393 Stress incontinence (female) (male): Secondary | ICD-10-CM | POA: Diagnosis present

## 2016-07-29 DIAGNOSIS — Z7984 Long term (current) use of oral hypoglycemic drugs: Secondary | ICD-10-CM

## 2016-07-29 DIAGNOSIS — I440 Atrioventricular block, first degree: Secondary | ICD-10-CM | POA: Diagnosis present

## 2016-07-29 DIAGNOSIS — E11621 Type 2 diabetes mellitus with foot ulcer: Secondary | ICD-10-CM | POA: Diagnosis present

## 2016-07-29 DIAGNOSIS — I080 Rheumatic disorders of both mitral and aortic valves: Secondary | ICD-10-CM | POA: Diagnosis present

## 2016-07-29 DIAGNOSIS — K5900 Constipation, unspecified: Secondary | ICD-10-CM | POA: Diagnosis present

## 2016-07-29 DIAGNOSIS — I130 Hypertensive heart and chronic kidney disease with heart failure and stage 1 through stage 4 chronic kidney disease, or unspecified chronic kidney disease: Principal | ICD-10-CM | POA: Diagnosis present

## 2016-07-29 DIAGNOSIS — L97519 Non-pressure chronic ulcer of other part of right foot with unspecified severity: Secondary | ICD-10-CM | POA: Diagnosis present

## 2016-07-29 DIAGNOSIS — I248 Other forms of acute ischemic heart disease: Secondary | ICD-10-CM | POA: Diagnosis present

## 2016-07-29 DIAGNOSIS — E11610 Type 2 diabetes mellitus with diabetic neuropathic arthropathy: Secondary | ICD-10-CM | POA: Diagnosis present

## 2016-07-29 DIAGNOSIS — Z841 Family history of disorders of kidney and ureter: Secondary | ICD-10-CM

## 2016-07-29 DIAGNOSIS — I5043 Acute on chronic combined systolic (congestive) and diastolic (congestive) heart failure: Secondary | ICD-10-CM | POA: Diagnosis present

## 2016-07-29 DIAGNOSIS — R634 Abnormal weight loss: Secondary | ICD-10-CM | POA: Diagnosis present

## 2016-07-29 DIAGNOSIS — L97509 Non-pressure chronic ulcer of other part of unspecified foot with unspecified severity: Secondary | ICD-10-CM | POA: Diagnosis present

## 2016-07-29 DIAGNOSIS — K859 Acute pancreatitis without necrosis or infection, unspecified: Secondary | ICD-10-CM | POA: Diagnosis present

## 2016-07-29 DIAGNOSIS — Z833 Family history of diabetes mellitus: Secondary | ICD-10-CM

## 2016-07-29 DIAGNOSIS — N390 Urinary tract infection, site not specified: Secondary | ICD-10-CM | POA: Diagnosis present

## 2016-07-29 DIAGNOSIS — I5041 Acute combined systolic (congestive) and diastolic (congestive) heart failure: Secondary | ICD-10-CM

## 2016-07-29 DIAGNOSIS — N183 Chronic kidney disease, stage 3 (moderate): Secondary | ICD-10-CM | POA: Diagnosis present

## 2016-07-29 DIAGNOSIS — E1140 Type 2 diabetes mellitus with diabetic neuropathy, unspecified: Secondary | ICD-10-CM | POA: Diagnosis present

## 2016-07-29 DIAGNOSIS — R748 Abnormal levels of other serum enzymes: Secondary | ICD-10-CM | POA: Diagnosis not present

## 2016-07-29 DIAGNOSIS — R000 Tachycardia, unspecified: Secondary | ICD-10-CM | POA: Diagnosis present

## 2016-07-29 DIAGNOSIS — M79609 Pain in unspecified limb: Secondary | ICD-10-CM | POA: Diagnosis not present

## 2016-07-29 DIAGNOSIS — E1122 Type 2 diabetes mellitus with diabetic chronic kidney disease: Secondary | ICD-10-CM | POA: Diagnosis present

## 2016-07-29 DIAGNOSIS — N179 Acute kidney failure, unspecified: Secondary | ICD-10-CM | POA: Diagnosis present

## 2016-07-29 DIAGNOSIS — Z888 Allergy status to other drugs, medicaments and biological substances status: Secondary | ICD-10-CM

## 2016-07-29 DIAGNOSIS — D638 Anemia in other chronic diseases classified elsewhere: Secondary | ICD-10-CM | POA: Diagnosis present

## 2016-07-29 DIAGNOSIS — I5031 Acute diastolic (congestive) heart failure: Secondary | ICD-10-CM | POA: Diagnosis not present

## 2016-07-29 DIAGNOSIS — M7989 Other specified soft tissue disorders: Secondary | ICD-10-CM | POA: Diagnosis not present

## 2016-07-29 DIAGNOSIS — M79673 Pain in unspecified foot: Secondary | ICD-10-CM

## 2016-07-29 DIAGNOSIS — R079 Chest pain, unspecified: Secondary | ICD-10-CM | POA: Diagnosis not present

## 2016-07-29 DIAGNOSIS — Z87440 Personal history of urinary (tract) infections: Secondary | ICD-10-CM

## 2016-07-29 DIAGNOSIS — G629 Polyneuropathy, unspecified: Secondary | ICD-10-CM | POA: Diagnosis present

## 2016-07-29 DIAGNOSIS — E785 Hyperlipidemia, unspecified: Secondary | ICD-10-CM | POA: Diagnosis present

## 2016-07-29 DIAGNOSIS — J189 Pneumonia, unspecified organism: Secondary | ICD-10-CM | POA: Diagnosis present

## 2016-07-29 DIAGNOSIS — Z79899 Other long term (current) drug therapy: Secondary | ICD-10-CM | POA: Diagnosis not present

## 2016-07-29 DIAGNOSIS — R109 Unspecified abdominal pain: Secondary | ICD-10-CM | POA: Diagnosis not present

## 2016-07-29 DIAGNOSIS — I509 Heart failure, unspecified: Secondary | ICD-10-CM | POA: Diagnosis not present

## 2016-07-29 DIAGNOSIS — R0602 Shortness of breath: Secondary | ICD-10-CM | POA: Diagnosis present

## 2016-07-29 LAB — URINALYSIS, ROUTINE W REFLEX MICROSCOPIC
Bilirubin Urine: NEGATIVE
Glucose, UA: >=500 mg/dL — AB
Hgb urine dipstick: NEGATIVE
Ketones, ur: NEGATIVE mg/dL
Leukocytes, UA: NEGATIVE
Nitrite: NEGATIVE
Protein, ur: >=300 mg/dL — AB
Specific Gravity, Urine: 1.016 (ref 1.005–1.030)
pH: 5 (ref 5.0–8.0)

## 2016-07-29 LAB — BASIC METABOLIC PANEL
Anion gap: 14 (ref 5–15)
BUN: 36 mg/dL — ABNORMAL HIGH (ref 6–20)
CO2: 21 mmol/L — ABNORMAL LOW (ref 22–32)
Calcium: 8.7 mg/dL — ABNORMAL LOW (ref 8.9–10.3)
Chloride: 99 mmol/L — ABNORMAL LOW (ref 101–111)
Creatinine, Ser: 1.83 mg/dL — ABNORMAL HIGH (ref 0.44–1.00)
GFR calc Af Amer: 30 mL/min — ABNORMAL LOW (ref >60)
GFR calc non Af Amer: 26 mL/min — ABNORMAL LOW (ref >60)
Glucose, Bld: 400 mg/dL — ABNORMAL HIGH (ref 65–99)
Potassium: 4.2 mmol/L (ref 3.5–5.1)
Sodium: 134 mmol/L — ABNORMAL LOW (ref 135–145)

## 2016-07-29 LAB — GLUCOSE, CAPILLARY: Glucose-Capillary: 404 mg/dL — ABNORMAL HIGH (ref 65–99)

## 2016-07-29 LAB — FERRITIN: Ferritin: 37 ng/mL (ref 11–307)

## 2016-07-29 LAB — HEPARIN LEVEL (UNFRACTIONATED): Heparin Unfractionated: 0.13 IU/mL — ABNORMAL LOW (ref 0.30–0.70)

## 2016-07-29 LAB — IRON AND TIBC
Iron: 24 ug/dL — ABNORMAL LOW (ref 28–170)
Saturation Ratios: 8 % — ABNORMAL LOW (ref 10.4–31.8)
TIBC: 287 ug/dL (ref 250–450)
UIBC: 263 ug/dL

## 2016-07-29 LAB — I-STAT TROPONIN, ED
Troponin i, poc: 0.53 ng/mL (ref 0.00–0.08)
Troponin i, poc: 0.67 ng/mL (ref 0.00–0.08)
Troponin i, poc: 0.74 ng/mL (ref 0.00–0.08)

## 2016-07-29 LAB — BRAIN NATRIURETIC PEPTIDE: B Natriuretic Peptide: 2605.1 pg/mL — ABNORMAL HIGH (ref 0.0–100.0)

## 2016-07-29 LAB — CBC
HCT: 28.9 % — ABNORMAL LOW (ref 36.0–46.0)
Hemoglobin: 8.5 g/dL — ABNORMAL LOW (ref 12.0–15.0)
MCH: 23.4 pg — ABNORMAL LOW (ref 26.0–34.0)
MCHC: 29.4 g/dL — ABNORMAL LOW (ref 30.0–36.0)
MCV: 79.6 fL (ref 78.0–100.0)
Platelets: 581 10*3/uL — ABNORMAL HIGH (ref 150–400)
RBC: 3.63 MIL/uL — ABNORMAL LOW (ref 3.87–5.11)
RDW: 17.7 % — ABNORMAL HIGH (ref 11.5–15.5)
WBC: 9.3 10*3/uL (ref 4.0–10.5)

## 2016-07-29 LAB — D-DIMER, QUANTITATIVE: D-Dimer, Quant: 8.35 ug/mL-FEU — ABNORMAL HIGH (ref 0.00–0.50)

## 2016-07-29 LAB — TROPONIN I
Troponin I: 0.38 ng/mL (ref <0.03)
Troponin I: 0.34 ng/mL (ref <0.03)

## 2016-07-29 LAB — POC OCCULT BLOOD, ED: Fecal Occult Bld: NEGATIVE

## 2016-07-29 MED ORDER — SODIUM CHLORIDE 0.9% FLUSH
3.0000 mL | Freq: Two times a day (BID) | INTRAVENOUS | Status: DC
  Administered 2016-07-29 – 2016-08-05 (×13): 3 mL via INTRAVENOUS

## 2016-07-29 MED ORDER — POLYETHYLENE GLYCOL 3350 17 G PO PACK
17.0000 g | PACK | Freq: Every day | ORAL | Status: DC | PRN
  Administered 2016-08-01 – 2016-08-03 (×2): 17 g via ORAL
  Filled 2016-07-29 (×2): qty 1

## 2016-07-29 MED ORDER — TRAMADOL HCL 50 MG PO TABS
50.0000 mg | ORAL_TABLET | Freq: Four times a day (QID) | ORAL | Status: DC | PRN
  Administered 2016-07-29 – 2016-08-04 (×8): 50 mg via ORAL
  Filled 2016-07-29 (×8): qty 1

## 2016-07-29 MED ORDER — MORPHINE SULFATE (PF) 4 MG/ML IV SOLN
4.0000 mg | Freq: Once | INTRAVENOUS | Status: AC
  Administered 2016-07-29: 4 mg via INTRAVENOUS
  Filled 2016-07-29: qty 1

## 2016-07-29 MED ORDER — SODIUM CHLORIDE 0.9 % IV SOLN
250.0000 mL | INTRAVENOUS | Status: DC | PRN
Start: 2016-07-29 — End: 2016-08-05

## 2016-07-29 MED ORDER — AMLODIPINE BESYLATE 5 MG PO TABS
5.0000 mg | ORAL_TABLET | Freq: Every day | ORAL | Status: DC
  Administered 2016-07-29 – 2016-08-05 (×8): 5 mg via ORAL
  Filled 2016-07-29 (×8): qty 1

## 2016-07-29 MED ORDER — SODIUM CHLORIDE 0.9% FLUSH: 3.0000 mL | INTRAVENOUS | Status: DC | PRN

## 2016-07-29 MED ORDER — HYDROCODONE-ACETAMINOPHEN 5-325 MG PO TABS
1.0000 | ORAL_TABLET | Freq: Four times a day (QID) | ORAL | Status: DC | PRN
  Administered 2016-07-29 – 2016-07-30 (×2): 1 via ORAL
  Filled 2016-07-29 (×2): qty 1

## 2016-07-29 MED ORDER — INSULIN ASPART 100 UNIT/ML ~~LOC~~ SOLN: 0.0000 [IU] | Freq: Three times a day (TID) | SUBCUTANEOUS | Status: DC

## 2016-07-29 MED ORDER — HEPARIN BOLUS VIA INFUSION
3500.0000 [IU] | Freq: Once | INTRAVENOUS | Status: AC
  Administered 2016-07-29: 3500 [IU] via INTRAVENOUS
  Filled 2016-07-29: qty 3500

## 2016-07-29 MED ORDER — HEPARIN (PORCINE) IN NACL 100-0.45 UNIT/ML-% IJ SOLN
1100.0000 [IU]/h | INTRAMUSCULAR | Status: DC
  Administered 2016-07-29: 800 [IU]/h via INTRAVENOUS
  Administered 2016-07-29 – 2016-07-30 (×2): 1000 [IU]/h via INTRAVENOUS
  Administered 2016-07-31: 1100 [IU]/h via INTRAVENOUS
  Filled 2016-07-29 (×3): qty 250

## 2016-07-29 MED ORDER — INSULIN ASPART 100 UNIT/ML ~~LOC~~ SOLN
0.0000 [IU] | Freq: Three times a day (TID) | SUBCUTANEOUS | Status: DC
  Administered 2016-07-29: 9 [IU] via SUBCUTANEOUS
  Administered 2016-07-30: 5 [IU] via SUBCUTANEOUS
  Administered 2016-07-30: 3 [IU] via SUBCUTANEOUS
  Administered 2016-07-30: 7 [IU] via SUBCUTANEOUS
  Administered 2016-07-31: 3 [IU] via SUBCUTANEOUS
  Administered 2016-07-31 – 2016-08-01 (×3): 5 [IU] via SUBCUTANEOUS
  Administered 2016-08-01 – 2016-08-02 (×3): 2 [IU] via SUBCUTANEOUS
  Administered 2016-08-03: 3 [IU] via SUBCUTANEOUS
  Administered 2016-08-03 – 2016-08-04 (×2): 5 [IU] via SUBCUTANEOUS
  Administered 2016-08-04 (×2): 3 [IU] via SUBCUTANEOUS
  Administered 2016-08-05: 5 [IU] via SUBCUTANEOUS
  Administered 2016-08-05: 2 [IU] via SUBCUTANEOUS

## 2016-07-29 MED ORDER — HEPARIN BOLUS VIA INFUSION
3000.0000 [IU] | Freq: Once | INTRAVENOUS | Status: AC
  Administered 2016-07-29: 3000 [IU] via INTRAVENOUS
  Filled 2016-07-29: qty 3000

## 2016-07-29 MED ORDER — FUROSEMIDE 10 MG/ML IJ SOLN
80.0000 mg | Freq: Two times a day (BID) | INTRAMUSCULAR | Status: AC
  Administered 2016-07-29 – 2016-07-30 (×2): 80 mg via INTRAVENOUS
  Filled 2016-07-29 (×2): qty 8

## 2016-07-29 NOTE — Progress Notes (Signed)
ANTICOAGULATION CONSULT NOTE  Pharmacy Consult for heparin Indication: chest pain/ACS  Allergies  Allergen Reactions  . Levofloxacin Nausea And Vomiting    Other reaction(s): Confusion  . Gabapentin Other (See Comments)    Disoriented, no strength in legs   Patient Measurements: Height: 5\' 6"  (167.6 cm) Weight: 136 lb (61.7 kg) IBW/kg (Calculated) : 59.3  Vital Signs: Temp: 97.8 F (36.6 C) (03/19 1857) Temp Source: Oral (03/19 1857) BP: 150/88 (03/19 1857) Pulse Rate: 106 (03/19 1857)  Labs:  Recent Labs  07/29/16 1037 07/29/16 1124 07/29/16 1958  HGB 8.5*  --   --   HCT 28.9*  --   --   PLT 581*  --   --   HEPARINUNFRC  --   --  0.13*  CREATININE 1.83*  --   --   TROPONINI  --  0.38* 0.34*    Estimated Creatinine Clearance: 25.2 mL/min (A) (by C-G formula based on SCr of 1.83 mg/dL (H)).   Medical History: Past Medical History:  Diagnosis Date  . Aortic stenosis, mild   . Charcot foot due to diabetes mellitus (HCC)   . Diabetes mellitus   . HTN (hypertension)   . Hyperlipidemia   . Neuropathy (HCC)   . Pancreatitis    Assessment: 76 yo female admitted with chest pain. No known a/c PTA. Hgb 8.5. Heparin gtt while ACS workup ongoing.   Initial heparin level low at 0.13, chest pain continues, will rebolus and increase rate. No bleeding issues noted.  Goal of Therapy:  Heparin level 0.3-0.7 units/ml Monitor platelets by anticoagulation protocol: Yes   Plan:  Give 3000 units bolus x 1 Increase heparin infusion to 1000 units/hr Check anti-Xa level in 8 hours and daily while on heparin Continue to monitor H&H and platelets  Sheppard Coil PharmD., BCPS Clinical Pharmacist Pager [phone] 07/29/2016 9:43 PM

## 2016-07-29 NOTE — ED Provider Notes (Signed)
[facility]-EMERGENCY DEPT Provider Note   CSN: [id number] Arrival date & time: 07/29/16  1024     History   Chief Complaint Chief Complaint  Patient presents with  . Chest Pain    HPI Emily Miranda is a 76 y.o. female.  HPI   76 year old female with history of HTN, HLD, DM2, L healing diabetic foot ulcer, recurrent UTIs on macrobid prophylaxis, and aortic stenosis, who presents for evaluation of chest pain and shortness of breath. Patient reports onset of a substernal crushing sensation radiating to her left jaw that began at about approximately 8 PM last night. Reports that the pain has been slowly increasing in intensity. It has been constant. No known aggravating or alleviating factors. Reports that it is not pleuritic but she has not been exerting herself. Additionally, reports onset of shortness of breath upon waking this morning. Dyspnea is worse when lying flat. Denies history of CHF. Reports swelling to her bilateral lower extremities that began a couple weeks ago. Denies prior history of the same. Denies calf tenderness and swelling is symmetrical. Denies history of DVT, PE, CVA, MI.  Additionally, pt reports cramping suprapubic abdominal pain. Denies dysuria or frequency. Denies flank pain, nausea, vomiting, or blood in her stool, but reports black tarry stools yesterday.   Past Medical History:  Diagnosis Date  . Aortic stenosis, mild   . Charcot foot due to diabetes mellitus (HCC)   . Diabetes mellitus   . HTN (hypertension)   . Hyperlipidemia   . Neuropathy (HCC)   . Pancreatitis     Patient Active Problem List   Diagnosis Date Noted  . Shortness of breath 07/29/2016  . Wound infection 11/13/2015  . Abdominal pain, RLQ (right lower quadrant) -chronic since 2009 06/02/2013  . Constipation, chronic 06/02/2013  . SUI (stress urinary incontinence, female) 06/02/2013  . Recurrent UTI (urinary tract infection) 06/02/2013  . HTN (hypertension)   . CHEST PAIN 11/27/2009    . OVERWEIGHT 11/28/2008  . ESSENTIAL HYPERTENSION, BENIGN 11/28/2008  . DM 11/25/2008  . AORTIC STENOSIS 11/25/2008  . PANCREATITIS, HX OF 11/25/2008    Past Surgical History:  Procedure Laterality Date  . ABDOMINAL HYSTERECTOMY    . APPENDECTOMY    . FOOT SURGERY    . LAPAROSCOPIC INCISIONAL / UMBILICAL / VENTRAL HERNIA REPAIR  01/04/2008   Dr Bertram Savin  . LAPAROSCOPIC LYSIS OF ADHESIONS  2007   Dr Donovan Kail  . OVARIAN CYST REMOVAL    . ROBOT ASSISTED PYELOPLASTY  02/2007   Dr Laverle Patter  . SUTURE REMOVAL  08/17/2009   Dr Bertram Savin .  Right paramedian [employer] stitch  . TONSILLECTOMY      OB History    No data available       Home Medications    Prior to Admission medications   Medication Sig Start Date End Date Taking? Authorizing Provider  amLODipine (NORVASC) 5 MG tablet Take 5 mg by mouth daily.  05/20/13  Yes Historical Provider, MD  Ascorbic Acid (VITAMIN C) 1000 MG tablet Take 1,000 mg by mouth daily.   Yes Historical Provider, MD  Aspirin-Acetaminophen-Caffeine (EXCEDRIN MIGRAINE PO) Take 2 tablets by mouth 3 (three) times daily as needed (pain/headache).   Yes Historical Provider, MD  chlorthalidone (HYGROTON) 25 MG tablet TAKE 1/2 TABLETS BY MOUTH DAILY. 07/08/16  Yes Rollene Rotunda, MD  diphenoxylate-atropine (LOMOTIL) 2.5-0.025 MG tablet Take 1 tablet by mouth 4 (four) times daily as needed for diarrhea or loose stools. 10/26/15  Yes Asencion Islam,  DPM  docusate sodium (COLACE) 100 MG capsule TAKE 1 TABLET BY MOUTH EVERY 8 HOURS, AS NEEDED FOR CONSTIPATION 07/09/16  Yes Titorya Stover, DPM  fexofenadine (ALLEGRA) 180 MG tablet Take 180 mg by mouth daily as needed for allergies or rhinitis (congestion).  01/12/16  Yes Historical Provider, MD  fluticasone (FLONASE) 50 MCG/ACT nasal spray Place 1 spray into both nostrils daily as needed for allergies or rhinitis (seasonal allergies).  02/05/16  Yes Historical Provider, MD  glimepiride (AMARYL) 4 MG tablet Take 4 mg by  mouth daily with breakfast.   Yes Historical Provider, MD  HYDROcodone-acetaminophen (NORCO) 10-325 MG tablet Take 1 tablet by mouth every 6 (six) hours as needed (pain).    Yes Asencion Islam, DPM  linagliptin (TRADJENTA) 5 MG TABS tablet Take 5 mg by mouth daily.    Yes Historical Provider, MD  lisinopril (PRINIVIL,ZESTRIL) 30 MG tablet Take 30 mg by mouth daily.  01/10/16  Yes Historical Provider, MD  lubiprostone (AMITIZA) 8 MCG capsule Take 8 mcg by mouth daily with breakfast.   Yes Historical Provider, MD  nitrofurantoin, macrocrystal-monohydrate, (MACROBID) 100 MG capsule Take 100 mg by mouth 2 (two) times daily.    Yes Historical Provider, MD  omeprazole (PRILOSEC) 20 MG capsule Take 20 mg by mouth daily.  12/09/15  Yes Historical Provider, MD  Phenyleph-Doxylamine-DM-APAP (ALKA SELTZER PLUS PO) Take 2 tablets by mouth daily as needed (congestion). Reported on 11/13/2015   Yes Historical Provider, MD  Polyethyl Glycol-Propyl Glycol (SYSTANE OP) Place 1 drop into both eyes daily as needed (dry eyes).   Yes Historical Provider, MD  polyethylene glycol (MIRALAX / GLYCOLAX) packet Take 17 g by mouth daily as needed (constipation). Mix in 8 oz liquid and drink   Yes Historical Provider, MD  promethazine (PHENERGAN) 25 MG tablet Take 25 mg by mouth every 8 (eight) hours as needed for nausea or vomiting.   Yes Asencion Islam, DPM  traMADol (ULTRAM) 50 MG tablet Take 1 tablet (50 mg total) by mouth every 8 (eight) hours as needed. Patient taking differently: Take 50 mg by mouth 2 (two) times daily as needed (pain).  06/17/16  Yes Asencion Islam, DPM  traMADol (ULTRAM) 50 MG tablet TAKE 1 TABLET EVERY 8 HOURS AS NEEDED FOR FOOT PAIN 07/09/16  Yes Asencion Islam, DPM  celecoxib (CELEBREX) 400 MG capsule Take 1 capsule (400 mg total) by mouth daily after breakfast. Patient not taking: Reported on 07/29/2016 04/03/16   Asencion Islam, DPM  gabapentin (NEURONTIN) 300 MG capsule Take 1 capsule (300 mg total) by  mouth at bedtime. Patient not taking: Reported on 07/29/2016 05/23/16   Asencion Islam, DPM  HYDROcodone-acetaminophen (NORCO) 5-325 MG per tablet Take 1 tablet by mouth every 6 (six) hours as needed. Patient not taking: Reported on 07/29/2016 08/08/14   Toy Cookey, MD  lidocaine (XYLOCAINE) 5 % ointment Apply 1 application topically as needed. Patient not taking: Reported on 07/29/2016 05/16/16   Asencion Islam, DPM  methocarbamol (ROBAXIN) 500 MG tablet Take 1.5 tablets (750 mg total) by mouth every 8 (eight) hours as needed for muscle spasms (use for muscle cramps/pain). Patient not taking: Reported on 11/13/2015 06/02/13   Karie Soda, MD  naproxen (NAPROSYN) 500 MG tablet Take 1 tablet (500 mg total) by mouth 2 (two) times daily with a meal. Patient not taking: Reported on 11/13/2015 06/02/13   Karie Soda, MD  ONE TOUCH ULTRA TEST test strip 1 each by Other route once a week.  05/26/13   Historical  Provider, MD  pregabalin (LYRICA) 75 MG capsule Take 1 capsule (75 mg total) by mouth 2 (two) times daily. Patient not taking: Reported on 11/13/2015 06/09/13   Karie Soda, MD    Family History Family History  Problem Relation Age of Onset  . Chronic Renal Failure Mother   . Diabetes Father   . Heart attack Father 27    Social History Social History  Substance Use Topics  . Smoking status: Never Smoker  . Smokeless tobacco: Never Used  . Alcohol use No     Allergies   Levofloxacin and Gabapentin   Review of Systems Review of Systems  Constitutional: Negative for chills and fever.  HENT: Negative for congestion, rhinorrhea and sore throat.   Eyes: Negative for visual disturbance.  Respiratory: Positive for shortness of breath. Negative for cough and wheezing.   Cardiovascular: Positive for chest pain and leg swelling. Negative for palpitations.  Gastrointestinal: Positive for abdominal pain. Negative for abdominal distention, blood in stool (black tarry stool yesterday),  constipation, diarrhea, nausea and vomiting.  Genitourinary: Negative for dysuria, flank pain, frequency and pelvic pain.  Musculoskeletal: Negative for arthralgias, back pain, gait problem, joint swelling, myalgias, neck pain and neck stiffness.  Skin: Negative for rash.  Neurological: Negative for dizziness, syncope, facial asymmetry, speech difficulty, weakness, numbness and headaches.  Psychiatric/Behavioral: Negative for agitation, behavioral problems and confusion.     Physical Exam Updated Vital Signs BP (!) 145/93 (BP Location: Right Arm)   Pulse 93   Temp 97.9 F (36.6 C) (Oral)   Resp 13   Ht 5\' 6"  (1.676 m)   Wt 61.7 kg   SpO2 100%   BMI 21.95 kg/m   Physical Exam  Constitutional: She is oriented to person, place, and time. She appears well-developed and well-nourished. No distress.  HENT:  Head: Normocephalic and atraumatic.  Eyes: Conjunctivae and EOM are normal. Pupils are equal, round, and reactive to light.  Neck: Normal range of motion. Neck supple.  Cardiovascular: Normal rate, regular rhythm, normal heart sounds and intact distal pulses.  Exam reveals no gallop and no friction rub.   No murmur heard. Pulmonary/Chest:  Increased work of breathing, with mild tachypnea. Crackles with scattered rales throughout. Diminished breath sounds at the bilateral bases.   Abdominal: Soft. Bowel sounds are normal. She exhibits no distension. There is no tenderness (no TTP). There is no guarding.  Genitourinary:  Genitourinary Comments: Hemoccult negative light brown stool in the rectal vault.   Musculoskeletal: She exhibits edema (2+ pitting edema to the bilateral LEs). She exhibits no tenderness or deformity.  Neurological: She is alert and oriented to person, place, and time. She exhibits normal muscle tone.  Skin: Skin is warm and dry. She is not diaphoretic.  Psychiatric: She has a normal mood and affect.  Nursing note and vitals reviewed.    ED Treatments / Results   Labs (all labs ordered are listed, but only abnormal results are displayed) Labs Reviewed  BASIC METABOLIC PANEL - Abnormal; Notable for the following:       Result Value   Sodium 134 (*)    Chloride 99 (*)    CO2 21 (*)    Glucose, Bld 400 (*)    BUN 36 (*)    Creatinine, Ser 1.83 (*)    Calcium 8.7 (*)    GFR calc non Af Amer 26 (*)    GFR calc Af Amer 30 (*)    All other components within normal limits  CBC -  Abnormal; Notable for the following:    RBC 3.63 (*)    Hemoglobin 8.5 (*)    HCT 28.9 (*)    MCH 23.4 (*)    MCHC 29.4 (*)    RDW 17.7 (*)    Platelets 581 (*)    All other components within normal limits  BRAIN NATRIURETIC PEPTIDE - Abnormal; Notable for the following:    B Natriuretic Peptide 2,605.1 (*)    All other components within normal limits  URINALYSIS, ROUTINE W REFLEX MICROSCOPIC - Abnormal; Notable for the following:    APPearance HAZY (*)    Glucose, UA >=500 (*)    Protein, ur >=300 (*)    Bacteria, UA RARE (*)    Squamous Epithelial / LPF 0-5 (*)    All other components within normal limits  TROPONIN I - Abnormal; Notable for the following:    Troponin I 0.38 (*)    All other components within normal limits  D-DIMER, QUANTITATIVE (NOT AT [facility]) - Abnormal; Notable for the following:    D-Dimer, Quant 8.35 (*)    All other components within normal limits  I-STAT TROPOININ, ED - Abnormal; Notable for the following:    Troponin i, poc 0.53 (*)    All other components within normal limits  I-STAT TROPOININ, ED - Abnormal; Notable for the following:    Troponin i, poc 0.67 (*)    All other components within normal limits  HEPARIN LEVEL (UNFRACTIONATED)  TROPONIN I  TROPONIN I  FERRITIN  IRON AND TIBC  HEPARIN LEVEL (UNFRACTIONATED)  BASIC METABOLIC PANEL  CBC  POC OCCULT BLOOD, ED  I-STAT TROPOININ, ED  Rosezena Sensor, ED    EKG  EKG Interpretation None       Radiology Dg Chest Portable 1 View  Result Date:  07/29/2016 CLINICAL DATA:  Shortness of breath, left chest pain. EXAM: PORTABLE CHEST 1 VIEW COMPARISON:  05/27/2016 FINDINGS: There are bilateral lower lobe airspace opacities with layering effusions. More confluent left perihilar airspace opacity. Findings concerning for pneumonia. Heart is normal size. No acute bony abnormality P IMPRESSION: Left perihilar and bilateral lower lobe airspace opacities with layering effusions. Findings concerning for pneumonia. Electronically Signed   By: Charlett Nose M.D.   On: 07/29/2016 11:48    Procedures Procedures (including critical care time)  Medications Ordered in ED Medications  heparin ADULT infusion 100 units/mL (25000 units/267mL sodium chloride 0.45%) (800 Units/hr Intravenous New Bag/Given 07/29/16 1313)  furosemide (LASIX) injection 80 mg (not administered)  morphine 4 MG/ML injection 4 mg (4 mg Intravenous Given 07/29/16 1312)  heparin bolus via infusion 3,500 Units (3,500 Units Intravenous Bolus from Bag 07/29/16 1316)     Initial Impression / Assessment and Plan / ED Course  I have reviewed the triage vital signs and the nursing notes.  Pertinent labs & imaging results that were available during my care of the patient were reviewed by me and considered in my medical decision making (see chart for details).     Patient appears to be in pain, clutching her chest. She has a new oxygen requirement of 4 L to maintain SPO2 in the low 90s. She has crackles and rales on lung exam, with breath sounds diminished at the bilateral bases. She also has new 2+ pitting edema to bilateral lower extremities. I'm concerned for new onset heart failure.  EKG is grossly unchanged from prior. First troponin elevated to 0.53. In the setting of chest pain radiating to the jaw, will start patient  on heparin for concern for NSTEMI, though this may be in the setting of demand from new onset heart failure.  Chest x-ray with layering effusions. In the absence of cough,  leukocytosis, or fever, I suspect that this is cardiogenic in origin rather than infectious from pneumonia. Will not start antibiotics at this time. BNP is elevated to 2,605, which further supports fluid overload state. Patient given 80 mg of IV Lasix. Remainder of labs reveal hyperglycemia to 400, with bicarbonate of 21 and 14. Doubt DKA. Patient has mildly worsening renal function, with creatinine of 1.83 and BUN of 36. Patient's hemoglobin has dropped to 8.5 from most recent baseline of 11 in our system, though the patient tells me that she was recently diagnosed with anemia by her PCP and given "some sort of injection to help me with it." She is unsure what this was. She denies blood in her stool but endorses having a dark stool yesterday. Hemoccult testing is negative, so I am comfortable starting the patient on a heparin drip.  Cardiology consultated and evaluated the patient in the emergency department. They agree with diuresis. Suspect her elevated troponin is likely in the setting of demand. Patient is admitted to the resident medicine service for further management.  Care of patient overseen by my attending, Dr. Jeraldine Loots.  Final Clinical Impressions(s) / ED Diagnoses   Final diagnoses:  Shortness of breath    New Prescriptions New Prescriptions   No medications on file     Jenifer Ernestina Penna, MD 07/29/16 1743    Gerhard Munch, MD 07/31/16 [phone]

## 2016-07-29 NOTE — H&P (Signed)
Family Medicine [facility] Admission History and Physical Service Pager: [phone]  Patient name: Emily Miranda Medical record number: [id number] Date of birth: January 10, 1941 Age: 76 y.o. Gender: female  Primary Care Provider: Noni Saupe., MD  Consultants: Cardiology  Code Status: FULL   Chief Complaint: Shortness of breath, chest pain   Assessment and Plan: Emily Miranda is a 76 y.o. female presenting with shortness of breath and chest pain. PMH is significant for T2DM, hx of pancreatitis, HTN, Charcot foot due to diabetes, stress urinary incontinence and hx of recurrent UTIs.    Dyspnea 2/2 CHF exacerbation vs PE. With history of worsening shortness of breath and chest pain with new leg swelling.  On arrival, VSS and patient afebrile with no white count. Concern for ACS, given dyspnea associated with chest pain.  I-stat troponin 0.53 with initial troponin 0.38.  EKG with no acute ischemia or changes from prior.  Was seen by cardiology and elevated troponin suspected to be likely secondary to demand ischemia in setting of fluid overload.  Patient with no oxygen requirement at home but here on 3L via [place] with O2 sats stable at 95%.  Differentials include PE vs. CHF exacerbation. CXR with left perihilar and bilateral lower lobe airspace opacities concerning for pneumonia.  However patient with no cough, fever or white count.  PE possible in setting of shortness of breath and RLE with increased swelling, erythema and warmth compared to LLE. She is also tachycardic to 112 in the ED. Patient with Anner Crete' score of 3 and no recent history of long travel or malignancy but does use a wheelchair as she cannot bear much weight on right foot due to painful diabetic ulcer and Charcot foot.  D-dimer ordered and was elevated to 0.83.  In setting of her new AKI,  V/Q scan ordered instead of CTA to evaluate for PE.  Started on heparin drip, per Cardiology.  CHF exacerbation also likely given  BNP elevated to 2605 and patient with obvious signs of fluid overload.  Given IV 80 mg Lasix x1 in ED with some improvement in symptoms.  Last echo in 2016 with EF 55-60%, G2DD, moderate aortic stenosis. Concern that her aortic stenosis has progressed to the point that it has led to a heart failure exacerbation. Other chronic underlying lung condition such as COPD unlikely, as Pt is a never smoker. -Admit to telemetry under inpatient status, attending Dr. Deirdre Priest  -Cardiology following, appreciate recs -Continuous cardiac monitoring -Trend troponins -AM EKG -Hold home NSAIDs in setting of active chest pain  -Continue Heparin gtt per pharmacy  -s/p IV Lasix 80 mg x2.  Will reassess fluid status in AM for additional diuresis.  -AM CBC/BMET -V/Q scan pending  -Echo pending  -RLE Doppler  -daily weights -I's and O's  -pulse ox with vital checks  -vitals per unit routine   Chest pain, r/o ACS.  Consider MSK component as pain is reproducible on palpation however concern for NSTEMI with L-sided chest pain radiating to jaw and left arm in the setting of elevated troponins. Patient also describes pain as "crushing".  Follows outpatient with [facility].  Also with aortic stenosis, which may be contributing to increased demand on the heart.  Last seen by Dr. Antoine Poche in Jan 2017 with no new changes and no repeat echo necessary at that time.  -AM EKG -Trend troponins -Echo pending  Right > Left Lower Extremity Edema: Pt noted to have R > L lower extremity edema. Right extremity  is mildly erythematous and warm to the touch. Charcot foot on that side may be contributing - RLE doppler US to rule out DVT  AKI: Cr 1.83 in the ED. Baseline ~0.8-0.9, but Pt has not had creatinine checked recently in our system. - Holding home Lisinopril and Chlorthalidone - Holding home Celebrex - Avoid nephrotoxic agents - Repeat AM BMET  HTN. BP elevated on admission to 151/88.  At home on Lisinopril 30 mg,  Chlorthalidone 12.5 mg and Norvasc 5 mg.  -Will hold Lisinopril and Chlorthalidone in setting of AKI.  -Continue home Norvasc 5 mg daily  -Monitor Cr with daily BMET   T2DM 2/2 pancreatitis.  Last A1c ?unknown.  Blood sugar in ED elevated to 400.  At home on Tradjenta 5 mg daily and glimepiride 4 mg daily.   Reports good compliance.   -Hold home medications  -sSSI for now  -CBGs 4x daily  -A1c pending   Hx of UTIs and stress urinary incontinence.  Reports increase in frequency but denies burning on urination.  Does not feel like she fully empties her bladder. UA with rare bacteria, >500 glucose, and >300 protein.  No antibiotics needed at this time.  Patient has been on multiple antibiotics for UTI treatment but is not currently taking any.    Microcytic anemia.  Hgb 8.5 from 11.9 in Feb 2011.  MCV low at 79. Outpatient, she was started on IV infusion .  FOBT negative in ED.   Will order further studies to evaluate. -Ferritin pending -Iron studies -Trend CBCs  Charcot foot due to diabetes mellitus with Diabetic ulcer.  Patient with history of ulcer on R foot for past 1.5 years.  She is followed outpatient by Podiatry.  S/p right foot wound debridement and placement of stravix allograft on 01/08/16.  Additionally, underwent office excision of ulceration on 02/29/2016 with pathology revealing traumatized tissue with no bacterial pathogens. Reports several weeks ago there was concern for DVT and dopplers were performed and negative.  Dressing over R foot ulcer is c/d/I. Of note, RLE is more swollen, warm and erythematous than LLE, concerning for DVT.   -stat RLE doppler  -wound care consult   Constipation Last BM was yesterday.  Reports that she had to strain.  Takes Miralax at home and it helps her.   -Continue Miralax daily prn    FEN/GI: Carb-modified diet, SLIV  Prophylaxis: Heparin gtt per cards   Disposition: Admit to telemetry, attending Dr. Deirdre Priest.   History of Present Illness:   Emily Miranda is a 76 y.o. female presenting with shortness of breath and chest pain.  She has been having on and off shortness of breath for years but no associated chest pain.  Does not have a home O2 requirement.  Had just had a bath yesterday evening around 7:30 PM and symptoms began around 8pm.  Occurred at rest when she was watching tv.  Felt worse when walking to the bathroom.  She felt flushed and as though she was going to pass out but she did not.   She describes her chest pain as L-sided, crushing and 10/10 in severity last night.  Radiated down left arm and side of jaw.  Also with stomach cramps this morning.  Feels like she has to get up and walk around to get some air but is unable to walk around much due to her foot pain.  She has a diabetic ulcer on R foot which is followed by Podiatry.  Outpatient she follows  with Cardiology at [facility].  Per patient her last echo was a long time ago .  Reports she has a stress test about 4 years ago.  Was supposed to have another done recently but given she is unable to bear weight on R foot she cannot walk on a treadmill.    Her R foot has had an ulcer for about a year and half which has been bothering her.  Podiatrist said it was the size of a golf ball. She has had 4 surgeries on it including skin grafts.  Over the course of a year it has shrunken in size.  She uses Tramadol for pain.   In the ED, she was hypertensive to 155/87. HRs elevated to 112. She required 2L O2 by [place] for desaturations to 89%. Labs significant for Cr 1.83 (BL ?0.8-0.9), Hgb 8.5, MCV 79.6, BNP 2605, d-dimer 8.35, trop 0.38 > 0.67. UA with >500 glucose, >300 protein, negative LE, negative nitrites. EKG with sinus tachycardia but no signs of ischemia/infarct. CXR with bilateral lower lobe opacities. Cardiology was consulted and Pt was started on a Heparin drip.  Review Of Systems: Per HPI with the following additions:   Review of Systems  Constitutional: Negative for chills,  diaphoresis and fever.  HENT: Negative for congestion and sore throat.   Respiratory: Positive for shortness of breath. Negative for cough and hemoptysis.   Cardiovascular: Positive for chest pain, orthopnea and leg swelling. Negative for palpitations.  Gastrointestinal: Positive for abdominal pain. Negative for nausea and vomiting.  Genitourinary: Positive for frequency and urgency. Negative for dysuria.  Neurological: Negative for weakness.    Patient Active Problem List   Diagnosis Date Noted  . Shortness of breath 07/29/2016  . Wound infection 11/13/2015  . Abdominal pain, RLQ (right lower quadrant) -chronic since 2009 06/02/2013  . Constipation, chronic 06/02/2013  . SUI (stress urinary incontinence, female) 06/02/2013  . Recurrent UTI (urinary tract infection) 06/02/2013  . HTN (hypertension)   . CHEST PAIN 11/27/2009  . OVERWEIGHT 11/28/2008  . ESSENTIAL HYPERTENSION, BENIGN 11/28/2008  . DM 11/25/2008  . AORTIC STENOSIS 11/25/2008  . PANCREATITIS, HX OF 11/25/2008   Past Medical History: Past Medical History:  Diagnosis Date  . Aortic stenosis, mild   . Charcot foot due to diabetes mellitus (HCC)   . Diabetes mellitus   . HTN (hypertension)   . Hyperlipidemia   . Neuropathy (HCC)   . Pancreatitis    Past Surgical History: Past Surgical History:  Procedure Laterality Date  . ABDOMINAL HYSTERECTOMY    . APPENDECTOMY    . FOOT SURGERY    . LAPAROSCOPIC INCISIONAL / UMBILICAL / VENTRAL HERNIA REPAIR  01/04/2008   Dr Bertram Savin  . LAPAROSCOPIC LYSIS OF ADHESIONS  2007   Dr Donovan Kail  . OVARIAN CYST REMOVAL    . ROBOT ASSISTED PYELOPLASTY  02/2007   Dr Laverle Patter  . SUTURE REMOVAL  08/17/2009   Dr Bertram Savin .  Right paramedian GoreTex stitch  . TONSILLECTOMY     Social History: Social History  Substance Use Topics  . Smoking status: Never Smoker  . Smokeless tobacco: Never Used  . Alcohol use No   Family History: Family History  Problem Relation Age of  Onset  . Chronic Renal Failure Mother   . Diabetes Father   . Heart attack Father 60   Allergies and Medications: Allergies  Allergen Reactions  . Levofloxacin Nausea And Vomiting    Other reaction(s): Confusion  . Gabapentin Other (See  Comments)    Disoriented, no strength in legs   No current facility-administered medications on file prior to encounter.    Current Outpatient Prescriptions on File Prior to Encounter  Medication Sig Dispense Refill  . amLODipine (NORVASC) 5 MG tablet Take 5 mg by mouth daily.     . chlorthalidone (HYGROTON) 25 MG tablet TAKE 1/2 TABLETS BY MOUTH DAILY. 45 tablet 0  . diphenoxylate-atropine (LOMOTIL) 2.5-0.025 MG tablet Take 1 tablet by mouth 4 (four) times daily as needed for diarrhea or loose stools. 30 tablet 0  . docusate sodium (COLACE) 100 MG capsule TAKE 1 TABLET BY MOUTH EVERY 8 HOURS, AS NEEDED FOR CONSTIPATION 30 capsule 0  . fexofenadine (ALLEGRA) 180 MG tablet Take 180 mg by mouth daily as needed for allergies or rhinitis (congestion).   5  . fluticasone (FLONASE) 50 MCG/ACT nasal spray Place 1 spray into both nostrils daily as needed for allergies or rhinitis (seasonal allergies).     Marland Kitchen glimepiride (AMARYL) 4 MG tablet Take 4 mg by mouth daily with breakfast.    . HYDROcodone-acetaminophen (NORCO) 10-325 MG tablet Take 1 tablet by mouth every 6 (six) hours as needed (pain).     Marland Kitchen linagliptin (TRADJENTA) 5 MG TABS tablet Take 5 mg by mouth daily.     Marland Kitchen lisinopril (PRINIVIL,ZESTRIL) 30 MG tablet Take 30 mg by mouth daily.     . nitrofurantoin, macrocrystal-monohydrate, (MACROBID) 100 MG capsule Take 100 mg by mouth 2 (two) times daily.     Marland Kitchen omeprazole (PRILOSEC) 20 MG capsule Take 20 mg by mouth daily.     Marland Kitchen Phenyleph-Doxylamine-DM-APAP (ALKA SELTZER PLUS PO) Take 2 tablets by mouth daily as needed (congestion). Reported on 11/13/2015    . promethazine (PHENERGAN) 25 MG tablet Take 25 mg by mouth every 8 (eight) hours as needed for nausea or  vomiting.    . traMADol (ULTRAM) 50 MG tablet Take 1 tablet (50 mg total) by mouth every 8 (eight) hours as needed. (Patient taking differently: Take 50 mg by mouth 2 (two) times daily as needed (pain). ) 30 tablet 0  . traMADol (ULTRAM) 50 MG tablet TAKE 1 TABLET EVERY 8 HOURS AS NEEDED FOR FOOT PAIN 50 tablet 0  . celecoxib (CELEBREX) 400 MG capsule Take 1 capsule (400 mg total) by mouth daily after breakfast. (Patient not taking: Reported on 07/29/2016) 30 capsule 0  . gabapentin (NEURONTIN) 300 MG capsule Take 1 capsule (300 mg total) by mouth at bedtime. (Patient not taking: Reported on 07/29/2016) 90 capsule 3  . HYDROcodone-acetaminophen (NORCO) 5-325 MG per tablet Take 1 tablet by mouth every 6 (six) hours as needed. (Patient not taking: Reported on 07/29/2016) 10 tablet 0  . lidocaine (XYLOCAINE) 5 % ointment Apply 1 application topically as needed. (Patient not taking: Reported on 07/29/2016) 50 g 2  . methocarbamol (ROBAXIN) 500 MG tablet Take 1.5 tablets (750 mg total) by mouth every 8 (eight) hours as needed for muscle spasms (use for muscle cramps/pain). (Patient not taking: Reported on 11/13/2015) 30 tablet 2  . naproxen (NAPROSYN) 500 MG tablet Take 1 tablet (500 mg total) by mouth 2 (two) times daily with a meal. (Patient not taking: Reported on 11/13/2015) 40 tablet 1  . ONE TOUCH ULTRA TEST test strip 1 each by Other route once a week.     . pregabalin (LYRICA) 75 MG capsule Take 1 capsule (75 mg total) by mouth 2 (two) times daily. (Patient not taking: Reported on 11/13/2015) 60 capsule 1  Objective: BP (!) 150/88 (BP Location: Left Arm)   Pulse (!) 106   Temp 97.8 F (36.6 C) (Oral)   Resp 18   Ht 5\' 6"  (1.676 m)   Wt 136 lb (61.7 kg)   SpO2 98%   BMI 21.95 kg/m  Exam: General: elderly 76 yo F in hospital bed, in NAD  Eyes: EOMI, PERRL  ENTM: MMM, oropharynx clear, [place] in place Neck: supple, no JVD  Cardiovascular:  RRR, II/VI systolic murmur heard loudest in the  RUSB Respiratory: crackles noted, with comfortable work of breathing on 3L per [place], diminished breath sounds in the lung bases bilaterally Gastrointestinal: soft, NT, ND, no guarding or rigidity noted, +bs  MSK: chest pain reproducible on exam, 2+ pitting edema in bilateral LE (R>L).  RLE with warmth, erythema compared to left.  No tenderness or deformity.  Negative Homan's sign.   R foot with dressing in place over diabetic ulcer.     Derm: skin is warm and dry, not diaphoretic Neuro: AAOx3, no focal deficits.   Psych: normal mood and effect.   Labs and Imaging: CBC BMET   Recent Labs Lab 07/29/16 1037  WBC 9.3  HGB 8.5*  HCT 28.9*  PLT 581*    Recent Labs Lab 07/29/16 1037  NA 134*  K 4.2  CL 99*  CO2 21*  BUN 36*  CREATININE 1.83*  GLUCOSE 400*  CALCIUM 8.7*     Dg Chest Portable 1 View  Result Date: 07/29/2016 CLINICAL DATA:  Shortness of breath, left chest pain. EXAM: PORTABLE CHEST 1 VIEW COMPARISON:  05/27/2016 FINDINGS: There are bilateral lower lobe airspace opacities with layering effusions. More confluent left perihilar airspace opacity. Findings concerning for pneumonia. Heart is normal size. No acute bony abnormality P IMPRESSION: Left perihilar and bilateral lower lobe airspace opacities with layering effusions. Findings concerning for pneumonia. Electronically Signed   By: Charlett Nose M.D.   On: 07/29/2016 11:48    Freddrick March, MD 07/29/2016, 8:27 PM PGY-1, [facility] FPTS Intern pager: [phone], text pages welcome  FPTS Upper-Level Resident Addendum  I have independently interviewed and examined the patient. I have discussed the above with the original author and agree with their documentation. My edits for correction/addition/clarification are in blue. Please see also any attending notes.   Willadean Carol, MD PGY-2, [facility] FPTS Service pager: [phone] (text pages welcome through [facility])

## 2016-07-29 NOTE — Telephone Encounter (Signed)
New message    Pt c/o of Chest Pain: STAT if CP now or developed within 24 hours  1. Are you having CP right now? Yes   2. Are you experiencing any other symptoms (ex. SOB, nausea, vomiting, sweating)? SOB, sweating  3. How long have you been experiencing CP? Since 8pm  4. Is your CP continuous or coming and going? continuous  5. Have you taken Nitroglycerin? No-does not have any. Took pain medicine ?

## 2016-07-29 NOTE — Telephone Encounter (Signed)
SPOKE WITH PT STATES THAT SHE IS HAVING CHEST PAN CURRENTLY, VERBALLY SOB AND SWEATING. INFORMED PT TO HAVE SOMEONE DRIVE HER TO THE NEAREST ER. PT STATES THAT SHE WOULD RATHER WAIT TO SEE DR HOCHREIN, INFORMED PT THAT WE CANNOT GET HER INTO SEE ANYONE HERE THIS WEEK AND THAT SHE NEEDS TO GO TO THE ER IMMEDIATELY FOR EVALUATION. PT STATES THAT SHE WILL HAVE SOMEONE TAKE HER TO THE ER.PT VERBALIZES UNDERSTANDING.

## 2016-07-29 NOTE — ED Triage Notes (Signed)
Pt sts left sided CP starting last night down to abd area

## 2016-07-29 NOTE — Consult Note (Signed)
Patient ID: Emily Miranda MRN: [id number], DOB/AGE: 1941/04/30   Admit date: 07/29/2016   Primary Physician: Noni Saupe., MD Primary Cardiologist: Dr Antoine Poche  HPI: 76 y/o from [place] followed by Dr Antoine Poche with a history of moderate AS, last echo Jan 2016. Her peak gradient was 45, mean 24 with an EF of 55% and grade 2DD. No history of CAD or MI. Other medical problems include HTN, chronic UTI, renal insufficiency y. HLD, and DM with neuropathy, nephropathy and Charcot Rt foot with chronic foot ulcer s/p multiple procedures. Her last procedure was Oct 2017.  She presents to the ED today with sharp Lt sided pleuritic chest pain and SOB which started last night. Her symptoms were worse when flat and worse with deep inspiration. She said she became diaphoretic when up to the BR. She has had LE edema for the past 1-2 weeks. No past history of CHF.    Problem List: Past Medical History:  Diagnosis Date  . Aortic stenosis, mild   . Charcot foot due to diabetes mellitus (HCC)   . Diabetes mellitus   . HTN (hypertension)   . Hyperlipidemia   . Neuropathy (HCC)   . Pancreatitis     Past Surgical History:  Procedure Laterality Date  . ABDOMINAL HYSTERECTOMY    . APPENDECTOMY    . FOOT SURGERY    . LAPAROSCOPIC INCISIONAL / UMBILICAL / VENTRAL HERNIA REPAIR  01/04/2008   Dr Bertram Savin  . LAPAROSCOPIC LYSIS OF ADHESIONS  2007   Dr Donovan Kail  . OVARIAN CYST REMOVAL    . ROBOT ASSISTED PYELOPLASTY  02/2007   Dr Laverle Patter  . SUTURE REMOVAL  08/17/2009   Dr Bertram Savin .  Right paramedian GoreTex stitch  . TONSILLECTOMY       Allergies:  Allergies  Allergen Reactions  . Levofloxacin Nausea And Vomiting    Other reaction(s): Confusion     Home Medications  Past Medical History:  Diagnosis Date  . Aortic stenosis, mild   . Charcot foot due to diabetes mellitus (HCC)   . Diabetes mellitus   . HTN (hypertension)   . Hyperlipidemia   . Neuropathy (HCC)   .  Pancreatitis     Prior to Admission medications   Medication Sig Start Date End Date Taking? Authorizing Provider  amLODipine (NORVASC) 5 MG tablet Take 5 mg by mouth daily.  05/20/13   Historical Provider, MD  amoxicillin (AMOXIL) 875 MG tablet  01/31/16   Historical Provider, MD  amoxicillin-clavulanate (AUGMENTIN) 875-125 MG tablet Take 1 tablet by mouth 2 (two) times daily. 05/23/16   Asencion Islam, DPM  Ascorbic Acid (VITAMIN C) 100 MG tablet Take 100 mg by mouth daily.      Historical Provider, MD  celecoxib (CELEBREX) 400 MG capsule Take 1 capsule (400 mg total) by mouth daily after breakfast. 04/03/16   Asencion Islam, DPM  cephALEXin (KEFLEX) 500 MG capsule Take 1 capsule (500 mg total) by mouth 2 (two) times daily. Patient not taking: Reported on 11/13/2015 04/26/15   Asencion Islam, DPM  chlorthalidone (HYGROTON) 25 MG tablet TAKE 1/2 TABLETS BY MOUTH DAILY. 07/08/16   Rollene Rotunda, MD  diphenoxylate-atropine (LOMOTIL) 2.5-0.025 MG tablet Take 1 tablet by mouth 4 (four) times daily as needed for diarrhea or loose stools. 10/26/15   Titorya Stover, DPM  docusate sodium (COLACE) 100 MG capsule TAKE 1 TABLET BY MOUTH EVERY 8 HOURS, AS NEEDED FOR CONSTIPATION 07/09/16   Asencion Islam, DPM  ertapenem 1 g in sodium chloride 0.9 % 50 mL Inject 1 g into the vein daily. 10/23/15   Titorya Stover, DPM  fexofenadine (ALLEGRA) 180 MG tablet Take 180 mg by mouth daily. 01/12/16   Historical Provider, MD  fluconazole (DIFLUCAN) 100 MG tablet Take two tablets daily for 14 days. 11/29/15   Asencion Islam, DPM  fluticasone (FLONASE) 50 MCG/ACT nasal spray  02/05/16   Historical Provider, MD  furosemide (LASIX) 20 MG tablet Take 20 mg by mouth daily. Reported on 11/13/2015 05/14/13   Historical Provider, MD  gabapentin (NEURONTIN) 300 MG capsule Take 1 capsule (300 mg total) by mouth at bedtime. 05/23/16   Titorya Stover, DPM  glimepiride (AMARYL) 4 MG tablet Take 4 mg by mouth daily with breakfast.    Historical  Provider, MD  HYDROcodone-acetaminophen (NORCO) 10-325 MG tablet Take 1 tablet by mouth every 6 (six) hours as needed.    Asencion Islam, DPM  HYDROcodone-acetaminophen (NORCO) 5-325 MG per tablet Take 1 tablet by mouth every 6 (six) hours as needed. 08/08/14   Toy Cookey, MD  HYDROcodone-acetaminophen (NORCO/VICODIN) 5-325 MG tablet Take by mouth. 08/08/14   Historical Provider, MD  Pincus Sanes 1 g injection  10/31/15   Historical Provider, MD  lidocaine (XYLOCAINE) 5 % ointment Apply 1 application topically as needed. 05/16/16   Titorya Stover, DPM  linagliptin (TRADJENTA) 5 MG TABS tablet Take by mouth.    Historical Provider, MD  linezolid (ZYVOX) 600 MG/300ML IVPB Inject 300 mLs (600 mg total) into the vein every 12 (twelve) hours. Patient not taking: Reported on 11/13/2015 10/23/15   Asencion Islam, DPM  lisinopril (PRINIVIL,ZESTRIL) 10 MG tablet Take 10 mg by mouth daily.    Historical Provider, MD  lisinopril (PRINIVIL,ZESTRIL) 30 MG tablet  01/10/16   Historical Provider, MD  lubiprostone (AMITIZA) 8 MCG capsule TAKE 1 CAPSULE (8 MCG TOTAL) BY MOUTH 2 (TWO) TIMES A DAY WITH MEALS. 12/11/15   Historical Provider, MD  methocarbamol (ROBAXIN) 500 MG tablet Take 1.5 tablets (750 mg total) by mouth every 8 (eight) hours as needed for muscle spasms (use for muscle cramps/pain). Patient not taking: Reported on 11/13/2015 06/02/13   Karie Soda, MD  naproxen (NAPROSYN) 500 MG tablet Take 1 tablet (500 mg total) by mouth 2 (two) times daily with a meal. Patient not taking: Reported on 11/13/2015 06/02/13   Karie Soda, MD  nitrofurantoin (MACRODANTIN) 50 MG capsule  12/18/15   Historical Provider, MD  nitrofurantoin, macrocrystal-monohydrate, (MACROBID) 100 MG capsule Take 100 mg by mouth 2 (two) times daily.     Historical Provider, MD  nitroGLYCERIN (NITRODUR - DOSED IN MG/24 HR) 0.1 mg/hr patch Frequency:UNKNOWN   Dosage:0.0     Instructions:  Note:Dose: . 12/03/11   Historical Provider, MD  omeprazole  (PRILOSEC) 20 MG capsule  12/09/15   Historical Provider, MD  omeprazole (PRILOSEC) 20 MG capsule TAKE 1 CAPSULE BY MOUTH IN THE MORNING 03/06/15   Historical Provider, MD  ONE TOUCH ULTRA TEST test strip 1 each by Other route once a week.  05/26/13   Historical Provider, MD  oxyCODONE-acetaminophen (PERCOCET/ROXICET) 5-325 MG tablet Take 1-2 tablets PO Q4H PRN pain 02/18/13   Historical Provider, MD  Phenyleph-Doxylamine-DM-APAP (ALKA SELTZER PLUS PO) Take 1 tablet by mouth daily as needed (congestion). Reported on 11/13/2015    Historical Provider, MD  pregabalin (LYRICA) 75 MG capsule Take 1 capsule (75 mg total) by mouth 2 (two) times daily. Patient not taking: Reported on 11/13/2015 06/09/13   Karie Soda,  MD  promethazine (PHENERGAN) 25 MG tablet Take 25 mg by mouth every 8 (eight) hours as needed for nausea or vomiting.    Asencion Islam, DPM  sodium chloride 0.9 % infusion  10/27/15   Historical Provider, MD  sulfamethoxazole-trimethoprim (BACTRIM DS,SEPTRA DS) 800-160 MG tablet Take 1 tablet by mouth 2 (two) times daily. 06/29/15   Titorya Stover, DPM  tetracycline (ACHROMYCIN,SUMYCIN) 500 MG capsule TAKE 1 CAPSULE (500 MG TOTAL) BY MOUTH 2 (TWO) TIMES DAILY. Patient not taking: Reported on 11/13/2015 10/10/15   Asencion Islam, DPM  TRADJENTA 5 MG TABS tablet Take 5 mg by mouth daily.  05/20/13   Historical Provider, MD  traMADol (ULTRAM) 50 MG tablet Take 1 tablet (50 mg total) by mouth every 8 (eight) hours as needed. 06/17/16   Asencion Islam, DPM  traMADol (ULTRAM) 50 MG tablet TAKE 1 TABLET EVERY 8 HOURS AS NEEDED FOR FOOT PAIN 07/09/16   Asencion Islam, DPM  vancomycin Robet Leu) 10 G SOLR injection  10/30/15   Historical Provider, MD    FM Hx- Mother had CRI, Father had DM-died of an MI at 80   Social History   Social History  . Marital status: Married    Spouse name: N/A  . Number of children: N/A  . Years of education: N/A   Occupational History  . Not on file.   Social History Main  Topics  . Smoking status: Never Smoker  . Smokeless tobacco: Never Used  . Alcohol use No  . Drug use: No  . Sexual activity: Not on file   Other Topics Concern  . Not on file   Social History Narrative  . No narrative on file     Review of Systems: General: negative for chills, fever, night sweats or weight changes.  Cardiovascular: negative for dyspnea on exertion, orthopnea, palpitations, paroxysmal nocturnal dyspnea  HEENT: negative for any visual disturbances, blindness, glaucoma Dermatological: negative for rash Respiratory: negative for cough, hemoptysis, or wheezing Urologic: negative for hematuria or dysuria-chronic UTIs Abdominal: negative for nausea, vomiting, diarrhea, bright red blood per rectum, melena, or hematemesis Neurologic: negative for visual changes, syncope, or dizziness Musculoskeletal: negative for back pain, joint pain, or swelling Psych: cooperative and appropriate All other systems reviewed and are otherwise negative except as noted above.  Physical Exam: Blood pressure (!) 153/85, pulse 98, temperature 97.9 F (36.6 C), temperature source Oral, resp. rate 18, height 5\' 6"  (1.676 m), weight 136 lb (61.7 kg), SpO2 94 %.  General appearance: alert, cooperative, no distress and pale Neck: no carotid bruit and no JVD Lungs: decreased Rt base, few rales Lt base Heart: regular rate and rhythm 2/6 systolic murmur and extra systole heard Abdomen: soft, non-tender; bowel sounds normal; no masses,  no organomegaly Extremities: 2+ pitting edema, boot, dressing Rt foot Pulses: diminnished Skin: pale, cool, dry Neurologic: Grossly normal    Labs:   Results for orders placed or performed during the hospital encounter of 07/29/16 (from the past 24 hour(s))  Basic metabolic panel     Status: Abnormal   Collection Time: 07/29/16 10:37 AM  Result Value Ref Range   Sodium 134 (L) 135 - 145 mmol/L   Potassium 4.2 3.5 - 5.1 mmol/L   Chloride 99 (L) 101 - 111  mmol/L   CO2 21 (L) 22 - 32 mmol/L   Glucose, Bld 400 (H) 65 - 99 mg/dL   BUN 36 (H) 6 - 20 mg/dL   Creatinine, Ser 9.52 (H) 0.44 - 1.00 mg/dL  Calcium 8.7 (L) 8.9 - 10.3 mg/dL   GFR calc non Af Amer 26 (L) >60 mL/min   GFR calc Af Amer 30 (L) >60 mL/min   Anion gap 14 5 - 15  CBC     Status: Abnormal   Collection Time: 07/29/16 10:37 AM  Result Value Ref Range   WBC 9.3 4.0 - 10.5 K/uL   RBC 3.63 (L) 3.87 - 5.11 MIL/uL   Hemoglobin 8.5 (L) 12.0 - 15.0 g/dL   HCT 16.1 (L) 09.6 - 04.5 %   MCV 79.6 78.0 - 100.0 fL   MCH 23.4 (L) 26.0 - 34.0 pg   MCHC 29.4 (L) 30.0 - 36.0 g/dL   RDW 40.9 (H) 81.1 - 91.4 %   Platelets 581 (H) 150 - 400 K/uL  I-stat troponin, ED     Status: Abnormal   Collection Time: 07/29/16 11:10 AM  Result Value Ref Range   Troponin i, poc 0.53 (HH) 0.00 - 0.08 ng/mL   Comment NOTIFIED PHYSICIAN    Comment 3          Brain natriuretic peptide     Status: Abnormal   Collection Time: 07/29/16 11:10 AM  Result Value Ref Range   B Natriuretic Peptide 2,605.1 (H) 0.0 - 100.0 pg/mL  POC occult blood, ED     Status: None   Collection Time: 07/29/16 11:41 AM  Result Value Ref Range   Fecal Occult Bld NEGATIVE NEGATIVE     Radiology/Studies: Dg Chest Portable 1 View  Result Date: 07/29/2016 CLINICAL DATA:  Shortness of breath, left chest pain. EXAM: PORTABLE CHEST 1 VIEW COMPARISON:  05/27/2016 FINDINGS: There are bilateral lower lobe airspace opacities with layering effusions. More confluent left perihilar airspace opacity. Findings concerning for pneumonia. Heart is normal size. No acute bony abnormality P IMPRESSION: Left perihilar and bilateral lower lobe airspace opacities with layering effusions. Findings concerning for pneumonia. Electronically Signed   By: Charlett Nose M.D.   On: 07/29/2016 11:48    EKG: NSR- Q V2-V3  ASSESSMENT AND PLAN:   Chest pain- atypical for angina-   Troponin elevation- 0.53  Abnormal CXR- ? CAP, ?PE  Moderate AS- last echo  Jan 2016, will repeat.  Acute on chronic renal insuf- Her last SCr in [facility] was 1.14 July 2014  Anemia- Hgb 8.5  H/O HTN- will hold ACE  DM- neuropathy, nephropathy, Charcot foot.  PLAN:   Plan- No NSAID, hold ACE, Check D-dimer, hold on anticoagulation for now. Check echo and try Lasix 80 mg IV BID and reevaluate tomorrow.   Jolene Provost, PA-C 07/29/2016, 12:26 PM [phone]  s above, patient seen and examined. Briefly she is a 76 year old female with past medical history of moderate aortic stenosis, hypertension, renal insufficiency, diabetes mellitus, chronic foot ulcer, hyperlipidemia for evaluation of chest pain and acute on chronic diastolic congestive heart failure. Patient states that for 6 months she has had orthopnea. Over the past 3 months she notes increasing pedal edema. She developed chest pain at 8:00 last evening. The pain was in the left chest, left arm and left abdominal area. The pain increases with lying flat and deep inspiration. It has been continuous. No nausea, vomiting or diaphoresis. She presented for further evaluation. She also complains of decreased appetite and weight loss. Because of the above we were asked to evaluate. Exam shows diminished breath sounds at the bases. 2/6 systolic murmur left sternal border. S2 is not diminished. 3+ pedal edema bilaterally. Chest x-ray shows bilateral pleural  effusions. Creatinine 1.83 with BUN 36. BNP 06/19/2003. Hemoglobin 8.5. Troponin 0.53. Electrocardiogram shows sinus rhythm with first-degree AV block and possible septal infarct.  1 chest pain-symptoms are atypical. They increase with lying flat and with inspiration. Troponin is mildly elevated at 0.53. Electrocardiogram shows no ST changes. Elevated troponin may be related to renal insufficiency. Continue to cycle to evaluate for trend. Schedule echocardiogram to assess LV function and wall motion. Check D dimer and if abnormal, VQ scan.  2 acute on chronic  diastolic congestive heart failure-patient is volume overloaded on examination. Begin Lasix 80 mg IV twice a day and follow renal function closely. Strict I's and O's and daily weights. Check echocardiogram for LV function and severity of aortic stenosis.   3 history of moderate aortic stenosis-aortic stenosis does not sound severe on exam. Recheck echocardiogram.  4 acute on chronic stage III kidney disease-follow renal function closely with diuresis.  5 question pneumonia-clinical presentation seems more consistent with congestive heart failure. Further evaluation per primary care.  6 weight loss-needs further evaluation and will leave to primary care.  7 Hypertension-continue amlodipine. Hold ACE inhibitor given renal insufficiency and need for diuresis.   Olga Millers, MD

## 2016-07-29 NOTE — ED Notes (Signed)
Admitting at bedside 

## 2016-07-29 NOTE — Progress Notes (Signed)
ANTICOAGULATION CONSULT NOTE - Initial Consult  Pharmacy Consult for heparin Indication: chest pain/ACS  Allergies  Allergen Reactions  . Levofloxacin Nausea And Vomiting    Other reaction(s): Confusion   Patient Measurements: Height: 5\' 6"  (167.6 cm) Weight: 136 lb (61.7 kg) IBW/kg (Calculated) : 59.3  Vital Signs: Temp: 97.9 F (36.6 C) (03/19 1036) Temp Source: Oral (03/19 1036) BP: 153/85 (03/19 1200) Pulse Rate: 98 (03/19 1200)  Labs:  Recent Labs  07/29/16 1037  HGB 8.5*  HCT 28.9*  PLT 581*  CREATININE 1.83*    Estimated Creatinine Clearance: 25.2 mL/min (A) (by C-G formula based on SCr of 1.83 mg/dL (H)).   Medical History: Past Medical History:  Diagnosis Date  . Aortic stenosis, mild   . Charcot foot due to diabetes mellitus (HCC)   . Diabetes mellitus   . HTN (hypertension)   . Hyperlipidemia   . Neuropathy (HCC)   . Pancreatitis    Assessment: 76 yo female admitted with chest pain. No known a/c PTA. Hgb 8.5. Heparin gtt while ACS workup ongoing.    Goal of Therapy:  Heparin level 0.3-0.7 units/ml Monitor platelets by anticoagulation protocol: Yes   Plan:  Give 3500 units bolus x 1 Start heparin infusion at 800. units/hr Check anti-Xa level in 8 hours and daily while on heparin Continue to monitor H&H and platelets  Pollyann Samples, PharmD, BCPS 07/29/2016, 12:25 PM

## 2016-07-29 NOTE — Progress Notes (Signed)
Patient complaining of pain in chest 8/10. Patient states pain is ongoing and is unable to lay flat. PRN tramadol given with little relief. MD paged and notified.

## 2016-07-29 NOTE — ED Notes (Signed)
Pt's O2 sats on room air-- 82% -- O2 placed on at 3l/m/[place]

## 2016-07-29 NOTE — ED Notes (Signed)
Abnormal I-stat trop of 0.67 reported to Dr. Moody Bruins

## 2016-07-30 ENCOUNTER — Inpatient Hospital Stay (HOSPITAL_COMMUNITY): Payer: Medicare Other

## 2016-07-30 ENCOUNTER — Telehealth: Payer: Self-pay | Admitting: Sports Medicine

## 2016-07-30 DIAGNOSIS — R634 Abnormal weight loss: Secondary | ICD-10-CM

## 2016-07-30 DIAGNOSIS — M7989 Other specified soft tissue disorders: Secondary | ICD-10-CM

## 2016-07-30 DIAGNOSIS — I5041 Acute combined systolic (congestive) and diastolic (congestive) heart failure: Secondary | ICD-10-CM

## 2016-07-30 DIAGNOSIS — R079 Chest pain, unspecified: Secondary | ICD-10-CM

## 2016-07-30 DIAGNOSIS — I5031 Acute diastolic (congestive) heart failure: Secondary | ICD-10-CM

## 2016-07-30 DIAGNOSIS — M79609 Pain in unspecified limb: Secondary | ICD-10-CM

## 2016-07-30 LAB — CBC
HCT: 28.8 % — ABNORMAL LOW (ref 36.0–46.0)
HCT: 31.4 % — ABNORMAL LOW (ref 36.0–46.0)
Hemoglobin: 8.3 g/dL — ABNORMAL LOW (ref 12.0–15.0)
Hemoglobin: 9.1 g/dL — ABNORMAL LOW (ref 12.0–15.0)
MCH: 22.7 pg — ABNORMAL LOW (ref 26.0–34.0)
MCH: 23.2 pg — ABNORMAL LOW (ref 26.0–34.0)
MCHC: 28.8 g/dL — ABNORMAL LOW (ref 30.0–36.0)
MCHC: 29 g/dL — ABNORMAL LOW (ref 30.0–36.0)
MCV: 78.9 fL (ref 78.0–100.0)
MCV: 79.9 fL (ref 78.0–100.0)
Platelets: 580 10*3/uL — ABNORMAL HIGH (ref 150–400)
Platelets: 412 10*3/uL — ABNORMAL HIGH (ref 150–400)
RBC: 3.65 MIL/uL — ABNORMAL LOW (ref 3.87–5.11)
RBC: 3.93 MIL/uL (ref 3.87–5.11)
RDW: 17.8 % — ABNORMAL HIGH (ref 11.5–15.5)
RDW: 17.8 % — ABNORMAL HIGH (ref 11.5–15.5)
WBC: 9.4 10*3/uL (ref 4.0–10.5)
WBC: 8.7 10*3/uL (ref 4.0–10.5)

## 2016-07-30 LAB — HEMOGLOBIN A1C
Hgb A1c MFr Bld: 12.5 % — ABNORMAL HIGH (ref 4.8–5.6)
Mean Plasma Glucose: 312 mg/dL

## 2016-07-30 LAB — HEPATIC FUNCTION PANEL
ALT: 7 U/L — ABNORMAL LOW (ref 14–54)
AST: 17 U/L (ref 15–41)
Albumin: 2.4 g/dL — ABNORMAL LOW (ref 3.5–5.0)
Alkaline Phosphatase: 82 U/L (ref 38–126)
Bilirubin, Direct: 0.1 mg/dL (ref 0.1–0.5)
Indirect Bilirubin: 0.1 mg/dL — ABNORMAL LOW (ref 0.3–0.9)
Total Bilirubin: 0.2 mg/dL — ABNORMAL LOW (ref 0.3–1.2)
Total Protein: 7.4 g/dL (ref 6.5–8.1)

## 2016-07-30 LAB — GLUCOSE, CAPILLARY
Glucose-Capillary: 326 mg/dL — ABNORMAL HIGH (ref 65–99)
Glucose-Capillary: 254 mg/dL — ABNORMAL HIGH (ref 65–99)
Glucose-Capillary: 224 mg/dL — ABNORMAL HIGH (ref 65–99)
Glucose-Capillary: 289 mg/dL — ABNORMAL HIGH (ref 65–99)

## 2016-07-30 LAB — BASIC METABOLIC PANEL
Anion gap: 13 (ref 5–15)
BUN: 38 mg/dL — ABNORMAL HIGH (ref 6–20)
CO2: 23 mmol/L (ref 22–32)
Calcium: 8.7 mg/dL — ABNORMAL LOW (ref 8.9–10.3)
Chloride: 99 mmol/L — ABNORMAL LOW (ref 101–111)
Creatinine, Ser: 1.85 mg/dL — ABNORMAL HIGH (ref 0.44–1.00)
GFR calc Af Amer: 30 mL/min — ABNORMAL LOW (ref >60)
GFR calc non Af Amer: 26 mL/min — ABNORMAL LOW (ref >60)
Glucose, Bld: 326 mg/dL — ABNORMAL HIGH (ref 65–99)
Potassium: 3.9 mmol/L (ref 3.5–5.1)
Sodium: 135 mmol/L (ref 135–145)

## 2016-07-30 LAB — SEDIMENTATION RATE: Sed Rate: 108 mm/hr — ABNORMAL HIGH (ref 0–22)

## 2016-07-30 LAB — TROPONIN I
Troponin I: 0.3 ng/mL (ref <0.03)
Troponin I: 0.26 ng/mL (ref <0.03)
Troponin I: 0.23 ng/mL (ref <0.03)
Troponin I: 0.19 ng/mL (ref <0.03)

## 2016-07-30 LAB — HEPARIN LEVEL (UNFRACTIONATED)
Heparin Unfractionated: 0.34 IU/mL (ref 0.30–0.70)
Heparin Unfractionated: 0.2 IU/mL — ABNORMAL LOW (ref 0.30–0.70)

## 2016-07-30 LAB — ECHOCARDIOGRAM COMPLETE
Height: 66 in
Weight: 2176 oz

## 2016-07-30 LAB — SAVE SMEAR

## 2016-07-30 MED ORDER — MORPHINE SULFATE (PF) 2 MG/ML IV SOLN
2.0000 mg | INTRAVENOUS | Status: DC | PRN
Start: 2016-07-30 — End: 2016-08-01
  Administered 2016-07-30 – 2016-08-01 (×12): 2 mg via INTRAVENOUS
  Filled 2016-07-30 (×12): qty 1

## 2016-07-30 MED ORDER — FUROSEMIDE 10 MG/ML IJ SOLN
120.0000 mg | Freq: Two times a day (BID) | INTRAMUSCULAR | Status: DC
  Administered 2016-07-30 – 2016-07-31 (×2): 120 mg via INTRAVENOUS
  Filled 2016-07-30 (×2): qty 12

## 2016-07-30 MED ORDER — TECHNETIUM TO 99M ALBUMIN AGGREGATED
4.1600 | Freq: Once | INTRAVENOUS | Status: AC | PRN
  Administered 2016-07-30: 4.16 via INTRAVENOUS

## 2016-07-30 MED ORDER — TECHNETIUM TC 99M DIETHYLENETRIAME-PENTAACETIC ACID
32.6000 | Freq: Once | INTRAVENOUS | Status: AC | PRN
  Administered 2016-07-30: 32.6 via RESPIRATORY_TRACT

## 2016-07-30 MED ORDER — ASPIRIN EC 81 MG PO TBEC
81.0000 mg | DELAYED_RELEASE_TABLET | Freq: Every day | ORAL | Status: DC
  Administered 2016-07-30 – 2016-08-05 (×7): 81 mg via ORAL
  Filled 2016-07-30 (×7): qty 1

## 2016-07-30 MED ORDER — INSULIN GLARGINE 100 UNIT/ML ~~LOC~~ SOLN
5.0000 [IU] | Freq: Every day | SUBCUTANEOUS | Status: DC
  Administered 2016-07-30: 5 [IU] via SUBCUTANEOUS
  Filled 2016-07-30: qty 0.05

## 2016-07-30 NOTE — Progress Notes (Signed)
ANTICOAGULATION CONSULT NOTE  Pharmacy Consult for heparin Indication: chest pain/ACS  Allergies  Allergen Reactions  . Levofloxacin Nausea And Vomiting    Other reaction(s): Confusion  . Gabapentin Other (See Comments)    Disoriented, no strength in legs   Patient Measurements: Height: 5\' 6"  (167.6 cm) Weight: 136 lb (61.7 kg) IBW/kg (Calculated) : 59.3  Vital Signs: Temp: 97.9 F (36.6 C) (03/20 1231) Temp Source: Oral (03/20 1231) BP: 121/59 (03/20 1231) Pulse Rate: 92 (03/20 1231)  Labs:  Recent Labs  07/29/16 1037  07/29/16 1958 07/30/16 0137 07/30/16 0642 07/30/16 1120 07/30/16 1526  HGB 8.5*  --   --  8.3*  --   --  9.1*  HCT 28.9*  --   --  28.8*  --   --  31.4*  PLT 581*  --   --  580*  --   --  412*  HEPARINUNFRC  --   --  0.13*  --  0.34  --  0.20*  CREATININE 1.83*  --   --  1.85*  --   --   --   TROPONINI  --   < > 0.34* 0.30*  --  0.26* 0.23*  < > = values in this interval not displayed. Estimated Creatinine Clearance: 25 mL/min (A) (by C-G formula based on SCr of 1.85 mg/dL (H)).  Medical History: Past Medical History:  Diagnosis Date  . Aortic stenosis, mild   . Charcot foot due to diabetes mellitus (HCC)   . Diabetes mellitus   . HTN (hypertension)   . Hyperlipidemia   . Neuropathy (HCC)   . Pancreatitis    Assessment: 76 yo female admitted with chest pain. No known a/c PTA. Heparin gtt while ACS vs. DVT/PE work up.   Heparin level subtherapeutic 0.2 on 1000 units/hr, no significant interruption with infusion. Troponin mildly elevated, but trending down now. D-dimmer elevated, but LE doppler negative, VQ scan low probability for PE. No bleeding noted.  Goal of Therapy:  Heparin level 0.3-0.7 units/ml Monitor platelets by anticoagulation protocol: Yes   Plan:  Increase heparin rate slightly to 1100 units/hr f/u labs and further plans in AM Continue to monitor H&H and platelets  Bayard Hugger, PharmD, BCPS  Clinical Pharmacist  Pager:  [phone]   07/30/2016 6:32 PM

## 2016-07-30 NOTE — Progress Notes (Signed)
*  PRELIMINARY RESULTS* Vascular Ultrasound Right lower extremity venous duplex has been completed.  Preliminary findings: No evidence of deep vein thrombosis involving the visualized veins of the right lower extremity. Moderate amount of pitting edema noted in the right calf. Small bakers cyst noted.  Chauncey Fischer 07/30/2016, 9:47 AM

## 2016-07-30 NOTE — Consult Note (Signed)
WOC Nurse wound consult note Reason for Consult: Chronic nonhealing neuropathic ulcer to right medial foot near malleols, close to plantar aspect.  Charcot deformity noted to right foot. Seen by podiatry.  Dry dressing only at this time.  Wound type:neuropathic Pressure Injury POA: Yes Measurement: 1 cm raised open lesion.  Pale pink nongranulating.  Drainage (amount, consistency, odor) scant serosanguinous Periwound:intact  Charcot deformity Dressing procedure/placement/frequency:Cleanse wound to right foot with NS.  Apply dry dressing daily.   Will not follow at this time.  Please re-consult if needed.  Maple Hudson RN BSN CWON Pager [phone]

## 2016-07-30 NOTE — Progress Notes (Signed)
Family Medicine Teaching Service Daily Progress Note Intern Pager: [phone]  Patient name: Emily Miranda Medical record number: [id number] Date of birth: 04-Nov-1940 Age: 76 y.o. Gender: female  Primary Care Provider: Angelina Sheriff., MD Consultants: cardiology  Code Status: FULL  Pt Overview and Major Events to Date:  3/19 admitted with crushing chest pain, heparin gtt started  Assessment and Plan: Emily Miranda is a 76 y.o. female presenting with shortness of breath and chest pain. PMH is significant for T2DM, hx of pancreatitis, HTN, Charcot foot due to diabetes, stress urinary incontinence and hx of recurrent UTIs.    Dyspnea 2/2 CHF exacerbation vs NSTEMI vs PE. I-stat troponin 0.53 with initial troponin 0.38. D-dimer ordered and was elevated to 0.83.  In setting of her new AKI,  V/Q scan ordered instead of CTA to evaluate for PE. Started on heparin drip, per Cardiology. CHF exacerbation also likely given BNP elevated to 2605 and patient with obvious signs of fluid overload. Given IV 80 mg Lasix x1 in ED with some improvement in symptoms. Last echo in 2016 with EF 55-60%, G2DD, moderate aortic stenosis. EKG 1st degree block, nonspecific T wave changes. -Cardiology following, appreciate recs: increase lasix -continue to trend troponins -Continue Heparin gtt per pharmacy  -s/p IV Lasix 80 mg x2, increase to lasix '120mg'$  IV BID  -V/Q scan pending  -Echo pending  -RLE Doppler  -daily weights, I's and O's   Chest pain, r/o ACS. Patient also describes pain as "crushing".  Follows outpatient with [facility].  Also with aortic stenosis, which may be contributing to increased demand on the heart.  Last seen by Dr. Percival Spanish in Jan 2017 with no new changes and no repeat echo necessary at that time.  -continue to trend troponins -Echo pending -added morphine and discontinued home norco  Right > Left Lower Extremity Edema: Pt noted to have R > L lower extremity edema. Right  extremity is mildly erythematous and warm to the touch. Charcot foot on that side may be contributing - RLE doppler US to rule out DVT  AKI: Cr 1.83 in the ED > 1.85. Baseline ~0.8-0.9, but Pt has not had creatinine checked recently in our system. - Holding home Lisinopril and Chlorthalidone - Holding home Celebrex - Avoid nephrotoxic agents - continue to follow BMP  Weight loss: patient notes unintentional 40 pound weight loss. Discussed with PCP who reports last planned colonoscopy was 2 years ago with Dr. Odie Sera and Esmond Plants is her GYN who would be managing mammograms.  -check LFTs -check ESR -repeat CXR in 2 days -peripheral smear  HTN. BP elevated on admission to 151/88.  At home on Lisinopril 30 mg, Chlorthalidone 12.5 mg and Norvasc 5 mg.  -Will hold Lisinopril and Chlorthalidone in setting of AKI.  -Continue home Norvasc 5 mg daily   T2DM 2/2 pancreatitis.  Last A1c ?unknown.  Blood sugar in ED elevated to 400.  At home on Tradjenta 5 mg daily and glimepiride 4 mg daily.   Reports good compliance.   -Hold home medications  -sSSI for now  -CBGs 4x daily  -A1c pending   Hx of UTIs and stress urinary incontinence.  Reports increase in frequency but denies burning on urination.  Does not feel like she fully empties her bladder. UA with rare bacteria, >500 glucose, and >300 protein.  No antibiotics needed at this time.  Patient has been on multiple antibiotics for UTI treatment but is not currently taking any.    Microcytic  anemia.  Hgb 8.5 from 11.9 in Feb 2011.  MCV low at 79. Outpatient, she was started on IV infusion . FOBT negative in ED.   Will order further studies to evaluate. Iron studies suggestive of iron deficiency anemia. Transfusion threshold 8 given concern for NSTEMI.  -Trend CBCs  Charcot foot due to diabetes mellitus with Diabetic ulcer.  Patient with history of ulcer on R foot for past 1.5 years.  She is followed outpatient by Podiatry. Reports  several weeks ago there was concern for DVT and dopplers were performed and negative.  Dressing over R foot ulcer is c/d/I. Of note, RLE was more swollen, warm and erythematous than LLE, concerning for DVT on presentation. -stat RLE doppler  -wound care consult   Constipation Last BM was yesterday.  Reports that she had to strain.  Takes Miralax at home and it helps her.   -Continue Miralax daily prn   FEN/GI: Carb-modified diet, SLIV  Prophylaxis: Heparin gtt per cards   Disposition: continued inpatient management of NSTEMI vs PE  Subjective:  Patient with persistent crushing chest pain, worse with moving around.   Objective: Temp:  [97.8 F (36.6 C)-98 F (36.7 C)] 98 F (36.7 C) (03/20 0756) Pulse Rate:  [89-114] 100 (03/20 0756) Resp:  [13-36] 18 (03/20 0756) BP: (132-159)/(68-98) 132/78 (03/20 0756) SpO2:  [89 %-100 %] 90 % (03/20 0756) FiO2 (%):  [28 %] 28 % (03/19 1116) Weight:  [136 lb (61.7 kg)] 136 lb (61.7 kg) (03/19 1200) Physical Exam: General: Elderly female sitting up in bed with [place] in place.  Cardiovascular: RRR, 2/6 systolic murmur at RUSB Respiratory: crackles in bilateral bases, easy WOB, good air movement Abdomen: SNTND, +BS Extremities: 3+ pitting edema bilaterally to knee, R midfoot bandaged, RLE not red or warm compared to L.  Laboratory:  Recent Labs Lab 07/29/16 1037 07/30/16 0137  WBC 9.3 9.4  HGB 8.5* 8.3*  HCT 28.9* 28.8*  PLT 581* 580*    Recent Labs Lab 07/29/16 1037 07/30/16 0137  NA 134* 135  K 4.2 3.9  CL 99* 99*  CO2 21* 23  BUN 36* 38*  CREATININE 1.83* 1.85*  CALCIUM 8.7* 8.7*  GLUCOSE 400* 326*   Trop 0.53>0.38>0.34>0.3  Imaging/Diagnostic Tests: Dg Chest Portable 1 View  Result Date: 07/29/2016 CLINICAL DATA:  Shortness of breath, left chest pain. EXAM: PORTABLE CHEST 1 VIEW COMPARISON:  05/27/2016 FINDINGS: There are bilateral lower lobe airspace opacities with layering effusions. More confluent left perihilar  airspace opacity. Findings concerning for pneumonia. Heart is normal size. No acute bony abnormality P IMPRESSION: Left perihilar and bilateral lower lobe airspace opacities with layering effusions. Findings concerning for pneumonia. Electronically Signed   By: Rolm Baptise M.D.   On: 07/29/2016 11:48    Sela Hilding, MD 07/30/2016, 8:03 AM PGY-1, [place] Intern pager: [phone], text pages welcome

## 2016-07-30 NOTE — Progress Notes (Signed)
ANTICOAGULATION CONSULT NOTE  Pharmacy Consult for heparin Indication: chest pain/ACS  Allergies  Allergen Reactions  . Levofloxacin Nausea And Vomiting    Other reaction(s): Confusion  . Gabapentin Other (See Comments)    Disoriented, no strength in legs   Patient Measurements: Height: 5\' 6"  (167.6 cm) Weight: 136 lb (61.7 kg) IBW/kg (Calculated) : 59.3  Vital Signs: Temp: 98 F (36.7 C) (03/20 0756) Temp Source: Oral (03/20 0756) BP: 132/78 (03/20 0756) Pulse Rate: 100 (03/20 0756)  Labs:  Recent Labs  07/29/16 1037 07/29/16 1124 07/29/16 1958 07/30/16 0137 07/30/16 0642  HGB 8.5*  --   --  8.3*  --   HCT 28.9*  --   --  28.8*  --   PLT 581*  --   --  580*  --   HEPARINUNFRC  --   --  0.13*  --  0.34  CREATININE 1.83*  --   --  1.85*  --   TROPONINI  --  0.38* 0.34* 0.30*  --    Estimated Creatinine Clearance: 25 mL/min (A) (by C-G formula based on SCr of 1.85 mg/dL (H)).  Medical History: Past Medical History:  Diagnosis Date  . Aortic stenosis, mild   . Charcot foot due to diabetes mellitus (HCC)   . Diabetes mellitus   . HTN (hypertension)   . Hyperlipidemia   . Neuropathy (HCC)   . Pancreatitis    Assessment: 76 yo female admitted with chest pain. No known a/c PTA. Heparin gtt while ACS workup ongoing.   Heparin level therapeutic this morning (0.34) after rate increase overnight. Hgb trended down slightly 8.5>8.3. No bleeding reported. Will get confirmatory heparin level and CBC this afternoon.   Goal of Therapy:  Heparin level 0.3-0.7 units/ml Monitor platelets by anticoagulation protocol: Yes   Plan:  Continue heparin gtt at 1000 units/hr Check anti-Xa level in 8 hours and daily while on heparin Continue to monitor H&H and platelets  Ruben Im, PharmD Clinical Pharmacist Pager: [phone] 07/30/2016 8:27 AM

## 2016-07-30 NOTE — Telephone Encounter (Signed)
Thanks will follow-up.

## 2016-07-30 NOTE — Progress Notes (Signed)
Pt refuses to ambulate at this time. Pt explained that she is not able to bear weight on her right foot d/t a golf ball sized hole on her foot.    07/30/16 0900  Mobility  Activity Other (Comment)

## 2016-07-30 NOTE — Progress Notes (Signed)
  Echocardiogram 2D Echocardiogram has been performed.  Emily Miranda 07/30/2016, 11:44 AM

## 2016-07-30 NOTE — Telephone Encounter (Signed)
Pt. Sister called to let you know the Emily Miranda has been admitted to [facility]. She has a hole in her foot and she would like for you to  Speak with the Physician there. Emily Hyacinth Meeker pt sister asked if you would give her a call.

## 2016-07-30 NOTE — Progress Notes (Signed)
Progress Note  Patient Name: Emily Miranda Date of Encounter: 07/30/2016  Primary Cardiologist: Dr Antoine Poche  Subjective   Chest pain has returned; increased with lying flat; still dyspneic but mildly improved  Inpatient Medications    Scheduled Meds: . amLODipine  5 mg Oral Daily  . insulin aspart  0-9 Units Subcutaneous TID WC  . sodium chloride flush  3 mL Intravenous Q12H   Continuous Infusions: . heparin 1,000 Units/hr (07/29/16 2145)   PRN Meds: sodium chloride, morphine injection, polyethylene glycol, sodium chloride flush, traMADol   Vital Signs    Vitals:   07/29/16 2111 07/30/16 0104 07/30/16 0756 07/30/16 0830  BP:  138/84 132/78 (!) 146/84  Pulse:  (!) 101 100 (!) 104  Resp:  20 18   Temp:  97.9 F (36.6 C) 98 F (36.7 C)   TempSrc:  Oral Oral   SpO2: 95% 95% 90%   Weight:      Height:        Intake/Output Summary (Last 24 hours) at 07/30/16 1002 Last data filed at 07/30/16 0949  Gross per 24 hour  Intake              660 ml  Output              150 ml  Net              510 ml   Filed Weights   07/29/16 1200  Weight: 136 lb (61.7 kg)    Telemetry    Sinus with pacs and pvcs- Personally Reviewed  Physical Exam   GEN: No acute distress.   Neck: Supple Cardiac: RRR, 2/6 systolic murmur Respiratory: Diminished BS bases GI: Soft, nontender, non-distended  MS: 2 + edema Neuro:  Nonfocal  Psych: Normal affect   Labs    Chemistry Recent Labs Lab 07/29/16 1037 07/30/16 0137  NA 134* 135  K 4.2 3.9  CL 99* 99*  CO2 21* 23  GLUCOSE 400* 326*  BUN 36* 38*  CREATININE 1.83* 1.85*  CALCIUM 8.7* 8.7*  GFRNONAA 26* 26*  GFRAA 30* 30*  ANIONGAP 14 13     Hematology Recent Labs Lab 07/29/16 1037 07/30/16 0137  WBC 9.3 9.4  RBC 3.63* 3.65*  HGB 8.5* 8.3*  HCT 28.9* 28.8*  MCV 79.6 78.9  MCH 23.4* 22.7*  MCHC 29.4* 28.8*  RDW 17.7* 17.8*  PLT 581* 580*    Cardiac Enzymes Recent Labs Lab 07/29/16 1124  07/29/16 1958 07/30/16 0137  TROPONINI 0.38* 0.34* 0.30*    Recent Labs Lab 07/29/16 1110 07/29/16 1544 07/29/16 1751  TROPIPOC 0.53* 0.67* 0.74*     BNP Recent Labs Lab 07/29/16 1110  BNP 2,605.1*     DDimer  Recent Labs Lab 07/29/16 1124  DDIMER 8.35*     Radiology    Dg Chest Portable 1 View  Result Date: 07/29/2016 CLINICAL DATA:  Shortness of breath, left chest pain. EXAM: PORTABLE CHEST 1 VIEW COMPARISON:  05/27/2016 FINDINGS: There are bilateral lower lobe airspace opacities with layering effusions. More confluent left perihilar airspace opacity. Findings concerning for pneumonia. Heart is normal size. No acute bony abnormality P IMPRESSION: Left perihilar and bilateral lower lobe airspace opacities with layering effusions. Findings concerning for pneumonia. Electronically Signed   By: Charlett Nose M.D.   On: 07/29/2016 11:48    Patient Profile     76 year old female with past medical history of moderate aortic stenosis, hypertension, renal insufficiency, diabetes mellitus, chronic foot ulcer, hyperlipidemia  for evaluation of chest pain and acute on chronic diastolic congestive heart failure.   Assessment & Plan    1 chest pain-symptoms atypical. Troponin is mildly elevated at 0.53 and fu with no clear trend; not c/w ACS. Elevated troponin may be related to renal insufficiency. Await echocardiogram to assess LV function and wall motion. Await VQ scan given elevated Ddimer.  2 acute on chronic diastolic congestive heart failure-patient is volume overloaded on examination. Increase Lasix to 120 mg IV twice a day and follow renal function closely. Strict I's and O's and daily weights. Await echocardiogram for LV function and severity of aortic stenosis.   3 history of moderate aortic stenosis-aortic stenosis does not sound severe on exam. Await echocardiogram.  4 acute on chronic stage III kidney disease-follow renal function closely with diuresis.  5 weight  loss-needs further evaluation and will leave to primary care.  6 Hypertension-continue amlodipine. ACE inhibitor on hold given renal insufficiency and need for diuresis.   7 anemia-eval per primary care  Signed, Olga Millers, MD  07/30/2016, 10:02 AM

## 2016-07-30 NOTE — Progress Notes (Signed)
Inpatient Diabetes Program Recommendations  AACE/ADA: New Consensus Statement on Inpatient Glycemic Control (2015)  Target Ranges:  Prepandial:   less than 140 mg/dL      Peak postprandial:   less than 180 mg/dL (1-2 hours)      Critically ill patients:  140 - 180 mg/dL   Lab Results  Component Value Date   GLUCAP 326 (H) 07/30/2016    Review of Glycemic Control Results for Emily Miranda, Emily Miranda (MRN [id number]) as of 07/30/2016 10:34  Ref. Range 07/29/2016 22:19 07/30/2016 07:42  Glucose-Capillary Latest Ref Range: 65 - 99 mg/dL 680 (H) 881 (H)   Diabetes history: DM2 Outpatient Diabetes medications: Amaryl 4 mg qd + Tradjenta 5 mg Current orders for Inpatient glycemic control: Novolog correction 0-9 units tid  Inpatient Diabetes Program Recommendations:   Reviewed CBGs. While oral medications held,  Consider adding Lantus (0.2 units/kg x 61.7 kg) = approx. 12 units  Thank you, [name]. Vadis Slabach, RN, MSN, CDE Inpatient Glycemic Control Team Team Pager [phone] (8am-5pm) 07/30/2016 10:50 AM

## 2016-07-31 ENCOUNTER — Inpatient Hospital Stay (HOSPITAL_COMMUNITY): Payer: Medicare Other

## 2016-07-31 DIAGNOSIS — I5041 Acute combined systolic (congestive) and diastolic (congestive) heart failure: Secondary | ICD-10-CM

## 2016-07-31 LAB — BASIC METABOLIC PANEL
Anion gap: 14 (ref 5–15)
BUN: 42 mg/dL — ABNORMAL HIGH (ref 6–20)
CO2: 21 mmol/L — ABNORMAL LOW (ref 22–32)
Calcium: 8.8 mg/dL — ABNORMAL LOW (ref 8.9–10.3)
Chloride: 97 mmol/L — ABNORMAL LOW (ref 101–111)
Creatinine, Ser: 1.63 mg/dL — ABNORMAL HIGH (ref 0.44–1.00)
GFR calc Af Amer: 35 mL/min — ABNORMAL LOW (ref >60)
GFR calc non Af Amer: 30 mL/min — ABNORMAL LOW (ref >60)
Glucose, Bld: 302 mg/dL — ABNORMAL HIGH (ref 65–99)
Potassium: 4.1 mmol/L (ref 3.5–5.1)
Sodium: 132 mmol/L — ABNORMAL LOW (ref 135–145)

## 2016-07-31 LAB — GLUCOSE, CAPILLARY
Glucose-Capillary: 270 mg/dL — ABNORMAL HIGH (ref 65–99)
Glucose-Capillary: 254 mg/dL — ABNORMAL HIGH (ref 65–99)
Glucose-Capillary: 222 mg/dL — ABNORMAL HIGH (ref 65–99)
Glucose-Capillary: 227 mg/dL — ABNORMAL HIGH (ref 65–99)

## 2016-07-31 LAB — CBC
HCT: 26.3 % — ABNORMAL LOW (ref 36.0–46.0)
Hemoglobin: 7.6 g/dL — ABNORMAL LOW (ref 12.0–15.0)
MCH: 23.2 pg — ABNORMAL LOW (ref 26.0–34.0)
MCHC: 28.9 g/dL — ABNORMAL LOW (ref 30.0–36.0)
MCV: 80.2 fL (ref 78.0–100.0)
Platelets: 519 10*3/uL — ABNORMAL HIGH (ref 150–400)
RBC: 3.28 MIL/uL — ABNORMAL LOW (ref 3.87–5.11)
RDW: 18 % — ABNORMAL HIGH (ref 11.5–15.5)
WBC: 10.2 10*3/uL (ref 4.0–10.5)

## 2016-07-31 LAB — C-REACTIVE PROTEIN: CRP: 3.8 mg/dL — ABNORMAL HIGH (ref <1.0)

## 2016-07-31 LAB — PATHOLOGIST SMEAR REVIEW

## 2016-07-31 LAB — TSH: TSH: 3.728 u[IU]/mL (ref 0.350–4.500)

## 2016-07-31 LAB — CREATININE, URINE, RANDOM: Creatinine, Urine: 86.65 mg/dL

## 2016-07-31 LAB — PREPARE RBC (CROSSMATCH)

## 2016-07-31 LAB — HEPARIN LEVEL (UNFRACTIONATED): Heparin Unfractionated: 0.36 IU/mL (ref 0.30–0.70)

## 2016-07-31 LAB — ABO/RH: ABO/RH(D): O POS

## 2016-07-31 MED ORDER — NITROGLYCERIN 0.4 MG SL SUBL
0.4000 mg | SUBLINGUAL_TABLET | SUBLINGUAL | Status: DC | PRN
  Administered 2016-07-31 (×2): 0.4 mg via SUBLINGUAL
  Filled 2016-07-31 (×2): qty 1

## 2016-07-31 MED ORDER — INSULIN GLARGINE 100 UNIT/ML ~~LOC~~ SOLN
10.0000 [IU] | Freq: Every day | SUBCUTANEOUS | Status: DC
  Administered 2016-07-31: 10 [IU] via SUBCUTANEOUS
  Filled 2016-07-31: qty 0.1

## 2016-07-31 MED ORDER — HEPARIN SODIUM (PORCINE) 5000 UNIT/ML IJ SOLN
5000.0000 [IU] | Freq: Three times a day (TID) | INTRAMUSCULAR | Status: DC
Start: 2016-07-31 — End: 2016-08-05
  Administered 2016-07-31 – 2016-08-05 (×16): 5000 [IU] via SUBCUTANEOUS
  Filled 2016-07-31 (×15): qty 1

## 2016-07-31 MED ORDER — SODIUM CHLORIDE 0.9 % IV SOLN
Freq: Once | INTRAVENOUS | Status: AC
  Administered 2016-07-31: 12:00:00 via INTRAVENOUS

## 2016-07-31 MED ORDER — TUBERCULIN PPD 5 UNIT/0.1ML ID SOLN
5.0000 [IU] | Freq: Once | INTRADERMAL | Status: AC
  Administered 2016-07-31: 5 [IU] via INTRADERMAL
  Filled 2016-07-31: qty 0.1

## 2016-07-31 MED ORDER — FUROSEMIDE 10 MG/ML IJ SOLN
80.0000 mg | Freq: Two times a day (BID) | INTRAMUSCULAR | Status: DC
  Administered 2016-07-31 – 2016-08-01 (×2): 80 mg via INTRAVENOUS
  Filled 2016-07-31 (×2): qty 8

## 2016-07-31 NOTE — Progress Notes (Signed)
Family Medicine Teaching Service Daily Progress Note Intern Pager: [phone]  Patient name: Emily Miranda Medical record number: [id number] Date of birth: January 23, 1941 Age: 76 y.o. Gender: female  Primary Care Provider: Angelina Sheriff., MD Consultants: cardiology  Code Status: FULL  Pt Overview and Major Events to Date:  3/19 admitted with crushing chest pain, heparin gtt started, cards consulted  Assessment and Plan: Emily Miranda is a 76 y.o. female presenting with shortness of breath and chest pain. PMH is significant for T2DM, hx of pancreatitis, HTN, Charcot foot due to diabetes, stress urinary incontinence and hx of recurrent UTIs.    Dyspnea 2/2 CHF exacerbation vs NSTEMI. EKG 1st degree block, nonspecific T wave changes. Echo with reduced ejection fraction 40-45% and akinesis of anteroseptal wall. Troponins downtrending to 0.19. V/Q scan without PE. RLE doppler negative for DVT. UOP 500.  -Cardiology following, appreciate recs -Continue Heparin gtt per pharmacy  -lasix '120mg'$  IV BID  -daily weights, I's and O's   Chest pain, r/o ACS. Patient also describes pain as "crushing".  Follows outpatient with [facility].  Also with aortic stenosis, which may be contributing to increased demand on the heart.  Last seen by Dr. Percival Spanish in Jan 2017 with no new changes and no repeat echo necessary at that time.  -added morphine and discontinued home norco  AKI: Cr 1.83 in the ED > 1.85. Baseline ~0.8-0.9, but Pt has not had creatinine checked recently in our system. - Holding home Lisinopril and Chlorthalidone - Holding home Celebrex - Avoid nephrotoxic agents - continue to follow BMP - FeUrea studies ordered  Weight loss: patient notes unintentional 40 pound weight loss. Discussed with PCP who reports last planned colonoscopy was 2 years ago with Dr. Odie Sera and Esmond Plants is her GYN who would be managing mammograms. LFTs WNL. ESR elevated at 108.  -repeat CXR  3/22 -peripheral smear  HTN. BP elevated on admission to 151/88.  At home on Lisinopril 30 mg, Chlorthalidone 12.5 mg and Norvasc 5 mg.  -Will hold Lisinopril and Chlorthalidone in setting of AKI.  -Continue home Norvasc 5 mg daily   T2DM 2/2 pancreatitis.  Last A1c ?unknown.  Blood sugar in ED elevated to 400.  At home on Tradjenta 5 mg daily and glimepiride 4 mg daily.   Reports good compliance. Hgba1c 12.5. -lantus 10U   -sSSI for now  -CBGs 4x daily   Hx of UTIs and stress urinary incontinence.  Reports increase in frequency but denies burning on urination.  Does not feel like she fully empties her bladder. UA with rare bacteria, >500 glucose, and >300 protein.  No antibiotics needed at this time.  Patient has been on multiple antibiotics for UTI treatment but is not currently taking any.    Microcytic anemia.  Hgb 8.5 from 11.9 in Feb 2011.  MCV low at 79. Outpatient, she was started on IV infusion . FOBT negative in ED. Iron studies suggestive of iron deficiency anemia. Patient reports she saw a hematologist as an outpatient who told her that her anemia was due to her kidneys and she takes a monthly expensive shot for this (?aranesp). Transfusion threshold 8 given concern for NSTEMI, discussed this with cards today and will await their recommendations. -consider blood tranfusion -Trend CBCs  Charcot foot due to diabetes mellitus with Diabetic ulcer.  Patient with history of ulcer on R foot for past 1.5 years.  She is followed outpatient by Podiatry. Reports several weeks ago there was concern for DVT  and dopplers were performed and negative.  Dressing over R foot ulcer is c/d/I. Of note, RLE was more swollen, warm and erythematous than LLE, concerning for DVT on presentation. -wound care consult   Constipation Last BM was yesterday.  Reports that she had to strain.  Takes Miralax at home and it helps her.   -Continue Miralax daily prn   FEN/GI: Carb-modified diet, SLIV   Prophylaxis: Heparin gtt per cards   Disposition: continued inpatient management of NSTEMI vs PE  Subjective:  Patient with some mild improvement in chest pain. Pleased that V/q scan was reassuring.   Objective: Temp:  [97.4 F (36.3 C)-98.3 F (36.8 C)] 97.8 F (36.6 C) (03/21 0500) Pulse Rate:  [92-104] 96 (03/21 0500) Resp:  [18] 18 (03/21 0500) BP: (121-146)/(59-84) 139/77 (03/21 0500) SpO2:  [90 %-92 %] 90 % (03/21 0500) Weight:  [154 lb 4.8 oz (70 kg)] 154 lb 4.8 oz (70 kg) (03/21 0500) Physical Exam: General: Elderly female sitting up in bed with [place] in place.  Cardiovascular: RRR, 2/6 systolic murmur at RUSB Respiratory: crackles in bilateral bases, easy WOB, good air movement Abdomen: SNTND, +BS Extremities: 3+ pitting edema bilaterally to knee, R midfoot bandaged, RLE not red or warm compared to L.  Laboratory:  Recent Labs Lab 07/30/16 0137 07/30/16 1526 07/31/16 0443  WBC 9.4 8.7 10.2  HGB 8.3* 9.1* 7.6*  HCT 28.8* 31.4* 26.3*  PLT 580* 412* 519*    Recent Labs Lab 07/29/16 1037 07/30/16 0137 07/30/16 1526 07/31/16 0443  NA 134* 135  --  132*  K 4.2 3.9  --  4.1  CL 99* 99*  --  97*  CO2 21* 23  --  21*  BUN 36* 38*  --  42*  CREATININE 1.83* 1.85*  --  1.63*  CALCIUM 8.7* 8.7*  --  8.8*  PROT  --   --  7.4  --   BILITOT  --   --  0.2*  --   ALKPHOS  --   --  82  --   ALT  --   --  7*  --   AST  --   --  17  --   GLUCOSE 400* 326*  --  302*   Trop 0.53>0.38>0.34>0.3  Imaging/Diagnostic Tests: Nm Pulmonary Perf And Vent  Result Date: 07/30/2016 CLINICAL DATA:  Shortness of Breath EXAM: NUCLEAR MEDICINE VENTILATION - PERFUSION LUNG SCAN VIEWS: Anterior, posterior, left lateral, right lateral, RPO, LPO, RAO, LAO -ventilation and perfusion RADIOPHARMACEUTICALS:  32.6 mCi Technetium-54mDTPA aerosol inhalation and 4.16 mCi Technetium-937mAA IV COMPARISON:  Chest radiograph July 30, 2014 FINDINGS: Ventilation: Radiotracer uptake appears  homogeneous and symmetric bilaterally. No focal ventilation defect is appreciable. Perfusion: Radiotracer uptake bilaterally is prompt and symmetric. No focal perfusion defects are evident. There is no appreciable ventilation/perfusion mismatch. IMPRESSION: There are no evident ventilation or perfusion defects. Study constitutes a very low probability of pulmonary embolus. Electronically Signed   By: WiLowella GripII M.D.   On: 07/30/2016 17:16   Dg Chest Portable 1 View  Result Date: 07/29/2016 CLINICAL DATA:  Shortness of breath, left chest pain. EXAM: PORTABLE CHEST 1 VIEW COMPARISON:  05/27/2016 FINDINGS: There are bilateral lower lobe airspace opacities with layering effusions. More confluent left perihilar airspace opacity. Findings concerning for pneumonia. Heart is normal size. No acute bony abnormality P IMPRESSION: Left perihilar and bilateral lower lobe airspace opacities with layering effusions. Findings concerning for pneumonia. Electronically Signed   By: KeLennette Bihari  [place] M.D.   On: 07/29/2016 11:48    Sela Hilding, MD 07/31/2016, 7:26 AM PGY-1, [place] Intern pager: [phone], text pages welcome

## 2016-07-31 NOTE — Plan of Care (Signed)
Problem: Pain Managment: Goal: General experience of comfort will improve Outcome: Progressing Pt. Receiving PRN pain med every 2-3 hours for pain management

## 2016-07-31 NOTE — Progress Notes (Signed)
ANTICOAGULATION CONSULT NOTE  Pharmacy Consult for heparin Indication: chest pain/ACS  Allergies  Allergen Reactions  . Levofloxacin Nausea And Vomiting    Other reaction(s): Confusion  . Gabapentin Other (See Comments)    Disoriented, no strength in legs   Patient Measurements: Height: 5\' 6"  (167.6 cm) Weight: 154 lb 4.8 oz (70 kg) IBW/kg (Calculated) : 59.3  Vital Signs: Temp: 97.8 F (36.6 C) (03/21 0500) Temp Source: Oral (03/21 0500) BP: 139/77 (03/21 0500) Pulse Rate: 96 (03/21 0500)  Labs:  Recent Labs  07/29/16 1037  07/30/16 0137 07/30/16 0642 07/30/16 1120 07/30/16 1526 07/30/16 2051 07/31/16 0443  HGB 8.5*  --  8.3*  --   --  9.1*  --  7.6*  HCT 28.9*  --  28.8*  --   --  31.4*  --  26.3*  PLT 581*  --  580*  --   --  412*  --  519*  HEPARINUNFRC  --   < >  --  0.34  --  0.20*  --  0.36  CREATININE 1.83*  --  1.85*  --   --   --   --  1.63*  TROPONINI  --   < > 0.30*  --  0.26* 0.23* 0.19*  --   < > = values in this interval not displayed. Estimated Creatinine Clearance: 28.3 mL/min (A) (by C-G formula based on SCr of 1.63 mg/dL (H)).  Assessment: 76 yo female admitted with chest pain. No known a/c PTA. Heparin gtt while ACS vs. DVT/PE work up.   Heparin level therapeutic 0.36 on 1100 units/hr, no significant interruption with infusion. Troponin mildly elevated, but trending down now. D-dimmer elevated, but LE doppler negative, VQ scan low probability for PE. No bleeding noted.  Goal of Therapy:  Heparin level 0.3-0.7 units/ml Monitor platelets by anticoagulation protocol: Yes   Plan:  Continue heparin rate at 1100 units/hr F/u daily heparin level  Christoper Fabian, PharmD, BCPS Clinical pharmacist, pager [phone] 07/31/2016 6:08 AM

## 2016-07-31 NOTE — Progress Notes (Signed)
Progress Note  Patient Name: Emily Miranda Date of Encounter: 07/31/2016  Primary Cardiologist: Dr Antoine Poche  Subjective   Chest pain has returned; "miserable". Holding lt upper chest. Worse with movement.  Inpatient Medications    Scheduled Meds: . amLODipine  5 mg Oral Daily  . aspirin EC  81 mg Oral Daily  . furosemide  120 mg Intravenous BID  . insulin aspart  0-9 Units Subcutaneous TID WC  . insulin glargine  10 Units Subcutaneous QHS  . sodium chloride flush  3 mL Intravenous Q12H   Continuous Infusions: . heparin 1,100 Units/hr (07/31/16 0931)   PRN Meds: sodium chloride, morphine injection, nitroGLYCERIN, polyethylene glycol, sodium chloride flush, traMADol   Vital Signs    Vitals:   07/30/16 2203 07/31/16 0047 07/31/16 0500 07/31/16 0928  BP: (!) 145/75 126/73 139/77 119/61  Pulse: (!) 102 (!) 102 96   Resp: 18 18 18    Temp: 98.3 F (36.8 C) 97.4 F (36.3 C) 97.8 F (36.6 C)   TempSrc: Oral Oral Oral   SpO2: 92% 92% 90%   Weight:   154 lb 4.8 oz (70 kg)   Height:        Intake/Output Summary (Last 24 hours) at 07/31/16 0958 Last data filed at 07/31/16 0901  Gross per 24 hour  Intake              782 ml  Output              500 ml  Net              282 ml   Filed Weights   07/29/16 1200 07/31/16 0500  Weight: 136 lb (61.7 kg) 154 lb 4.8 oz (70 kg)    Telemetry    Sinus with pacs and pvcs- Personally Reviewed  Physical Exam   GEN: Pale, mild distress  Neck: Supple Cardiac: RRR, 2/6 systolic murmur Respiratory: Diminished BS bases GI: Soft, nontender, non-distended  Neuro:  Nonfocal  Psych: Normal affect   Labs    Chemistry  Recent Labs Lab 07/29/16 1037 07/30/16 0137 07/30/16 1526 07/31/16 0443  NA 134* 135  --  132*  K 4.2 3.9  --  4.1  CL 99* 99*  --  97*  CO2 21* 23  --  21*  GLUCOSE 400* 326*  --  302*  BUN 36* 38*  --  42*  CREATININE 1.83* 1.85*  --  1.63*  CALCIUM 8.7* 8.7*  --  8.8*  PROT  --   --  7.4  --     ALBUMIN  --   --  2.4*  --   AST  --   --  17  --   ALT  --   --  7*  --   ALKPHOS  --   --  82  --   BILITOT  --   --  0.2*  --   GFRNONAA 26* 26*  --  30*  GFRAA 30* 30*  --  35*  ANIONGAP 14 13  --  14     Hematology  Recent Labs Lab 07/30/16 0137 07/30/16 1526 07/31/16 0443  WBC 9.4 8.7 10.2  RBC 3.65* 3.93 3.28*  HGB 8.3* 9.1* 7.6*  HCT 28.8* 31.4* 26.3*  MCV 78.9 79.9 80.2  MCH 22.7* 23.2* 23.2*  MCHC 28.8* 29.0* 28.9*  RDW 17.8* 17.8* 18.0*  PLT 580* 412* 519*    Cardiac Enzymes  Recent Labs Lab 07/30/16 0137 07/30/16 1120 07/30/16  1526 07/30/16 2051  TROPONINI 0.30* 0.26* 0.23* 0.19*     Recent Labs Lab 07/29/16 1110 07/29/16 1544 07/29/16 1751  TROPIPOC 0.53* 0.67* 0.74*     BNP  Recent Labs Lab 07/29/16 1110  BNP 2,605.1*     DDimer   Recent Labs Lab 07/29/16 1124  DDIMER 8.35*     Radiology    Nm Pulmonary Perf And Vent  Result Date: 07/30/2016 CLINICAL DATA:  Shortness of Breath EXAM: NUCLEAR MEDICINE VENTILATION - PERFUSION LUNG SCAN VIEWS: Anterior, posterior, left lateral, right lateral, RPO, LPO, RAO, LAO -ventilation and perfusion RADIOPHARMACEUTICALS:  32.6 mCi Technetium-21m DTPA aerosol inhalation and 4.16 mCi Technetium-39m MAA IV COMPARISON:  Chest radiograph July 30, 2014 FINDINGS: Ventilation: Radiotracer uptake appears homogeneous and symmetric bilaterally. No focal ventilation defect is appreciable. Perfusion: Radiotracer uptake bilaterally is prompt and symmetric. No focal perfusion defects are evident. There is no appreciable ventilation/perfusion mismatch. IMPRESSION: There are no evident ventilation or perfusion defects. Study constitutes a very low probability of pulmonary embolus. Electronically Signed   By: Bretta Bang III M.D.   On: 07/30/2016 17:16   Dg Chest Portable 1 View  Result Date: 07/29/2016 CLINICAL DATA:  Shortness of breath, left chest pain. EXAM: PORTABLE CHEST 1 VIEW COMPARISON:  05/27/2016  FINDINGS: There are bilateral lower lobe airspace opacities with layering effusions. More confluent left perihilar airspace opacity. Findings concerning for pneumonia. Heart is normal size. No acute bony abnormality P IMPRESSION: Left perihilar and bilateral lower lobe airspace opacities with layering effusions. Findings concerning for pneumonia. Electronically Signed   By: Charlett Nose M.D.   On: 07/29/2016 11:48    Patient Profile     76 year old female with past medical history of moderate aortic stenosis, hypertension, renal insufficiency, diabetes mellitus, chronic foot ulcer, hyperlipidemia for evaluation of chest pain and acute on chronic diastolic congestive heart failure.   Assessment & Plan    1 chest pain-Some of her symptoms atypical for [place]. Troponin was mildly elevated at 0.34 and has trended down. VQ negative for PE, echo did not show severe AS.   2 acute on chronic diastolic congestive heart failure-patient is volume overloaded on examination. Lasix to 120 mg IV twice a day did not apparently result in diuresis. I/O + 792 and wgt up ?? 18lbs.  3 history of moderate aortic stenosis-mild AS by echo. She did have a new WMA with an EF of 40-45%- ? Takotsubo   4 acute on chronic stage III kidney disease-SCr down to 1.63 today  5 weight loss-needs further evaluation and will leave to primary care.  6 Hypertension-continue amlodipine. ACE inhibitor on hold given renal insufficiency.   7 anemia-eval per primary care-stool negative, Hgb down to 7.6-she is still on IV Heparin  8. Charcot foot with diabetic ulcer. Patient with history of ulcer on R foot for past 1.5 years. She is followed outpatient by Podiatry. Reports several weeks ago there was concern for DVT and dopplers were performed and negative.  Plan: Will discuss with MD. Suspect Takotsubo event. She probably needs cath but hesitant to proceed with CRI and dropping Hbg. I have made her NPO. Will check an EKG, try SL NTG  x 1.- Dr Jens Som to see.   Signed, [name], PA-C  07/31/2016, 9:58 AM   As above, patient seen and examined. Patient complains of continuing chest pain today. It has been continuous since Sunday evening. It increases with moving her left upper extremity. She remains somewhat dyspneic but improving. Exam shows  diminished breath sounds at the bases and 1+ lower extremity edema. Laboratories show troponin 0.38, 0.34, 0.30, 0.26, 0.23, 0.19 Echocardiogram shows ejection fraction 40-45% with akinesis of the mid anteroseptal myocardium. There is grade 2 diastolic dysfunction, mild aortic stenosis, mild mitral regurgitation and moderate left atrial enlargement. Electrocardiogram today shows sinus rhythm with PVCs, normal axis, first-degree AV block, cannot rule out prior septal infarct and nonspecific ST changes. BUN 42 and creatinine 1.63. Hemoglobin 7.6.  1 chest pain-I remain unclear about etiology of her pain. It has been persistent and there is no clear trend with her cardiac markers and no diagnostic ST changes. Her pain increases with moving her upper extremity. Possible musculoskeletal pain. However there is a wall motion abnormality on her echocardiogram. I think she will ultimately require cardiac catheterization but she will be at risk for contrast nephropathy given renal insufficiency and diabetes mellitus. I will hold diuretics after this PM. She is also more anemic. I would recommend transfusing 1 unit packed red blood cells. We will recheck renal function tomorrow morning and if improving or stable proceed with cardiac catheterization. Limit dye. Note VQ scan shows no pulmonary embolus.   2 acute on chronic combined systolic/diastolic congestive heart failure-she remains volume overloaded on exam and is not diuresing much. Continue lasix today and hold this PM. I would favor right heart catheterization at time of left to check pulmonary pressures and pulmonary capillary wedge pressure. We  will resume diuretics following catheterization.  3 acute on chronic stage III kidney disease-follow renal function closely.  4 Aortic stenosis-mild on follow-up echo.   5 anemia-chronic normocytic. Further decrease today without evidence of blood loss. I think we can discontinue her heparin. Transfuse as outlined above. Further evaluation per primary care.   6 elevated sedimentation rate and weight loss-needs further workup per primary care.   Olga Millers, MD

## 2016-07-31 NOTE — Evaluation (Signed)
Physical Therapy Evaluation Patient Details Name: Emily Miranda MRN: [id number] DOB: July 21, 1940 Today's Date: 07/31/2016   History of Present Illness  76 y.o. female presenting with shortness of breath and chest pain. PMH is significant for T2DM, hx of pancreatitis, HTN, Charcot foot due to diabetes, stress urinary incontinence and hx of recurrent UTIs.    Clinical Impression  Patient demonstrates deficits in functional mobility as indicated below. Will need continued skilled PT to address deficits and maximize function. Will see as indicated and progress as tolerated.     Follow Up Recommendations Home health PT;Supervision/Assistance - 24 hour    Equipment Recommendations  None recommended by PT    Recommendations for Other Services       Precautions / Restrictions Precautions Precautions: Fall Restrictions Weight Bearing Restrictions: No      Mobility  Bed Mobility Overal bed mobility: Modified Independent             General bed mobility comments: patient able to position in bed without physical assist, use of bed rail and increased time required  Transfers Overall transfer level: Needs assistance Equipment used: None Transfers: Sit to/from Stand;Stand Pivot Transfers Sit to Stand: Min assist Stand pivot transfers: Min assist       General transfer comment: Min assist for safety and stability, patient unable to tolerate weight through RLE due to chronic foot wound  Ambulation/Gait             General Gait Details: deferred due to patient baseline of limited mobility and currently deferring ambulation  Stairs            Wheelchair Mobility    Modified Rankin (Stroke Patients Only)       Balance Overall balance assessment: Needs assistance Sitting-balance support: Feet supported Sitting balance-Leahy Scale: Good Sitting balance - Comments: able to sit unassisted and reach outside BOS     Standing balance-Leahy Scale: Fair Standing  balance comment: able to stand for brief periods and perform hygiene                             Pertinent Vitals/Pain Pain Assessment: Faces Faces Pain Scale: Hurts little more Pain Location: chest Pain Descriptors / Indicators: Grimacing;Guarding Pain Intervention(s): Monitored during session    Home Living Family/patient expects to be discharged to:: Private residence Living Arrangements: Spouse/significant other Available Help at Discharge: Family Type of Home: Apartment Home Access: Level entry     Home Layout: One level Home Equipment: Environmental consultant - 2 wheels;Walker - 4 wheels;Wheelchair - manual      Prior Function Level of Independence: Needs assistance   Gait / Transfers Assistance Needed: assist for trasfers to wheel chair, can minimally ambulate from chair to toilet with increased asssit           Hand Dominance   Dominant Hand: Right    Extremity/Trunk Assessment   Upper Extremity Assessment Upper Extremity Assessment: Generalized weakness    Lower Extremity Assessment Lower Extremity Assessment: Generalized weakness;RLE deficits/detail RLE Deficits / Details: patient with Right foot chronic wound and history of prior surgeries for foot RLE Coordination: decreased fine motor;decreased gross motor       Communication   Communication: HOH  Cognition Arousal/Alertness: Awake/alert Behavior During Therapy: WFL for tasks assessed/performed Overall Cognitive Status: Within Functional Limits for tasks assessed                      General Comments  Exercises     Assessment/Plan    PT Assessment Patient needs continued PT services  PT Problem List Decreased strength;Decreased activity tolerance;Decreased balance;Decreased mobility;Decreased coordination;Cardiopulmonary status limiting activity;Pain       PT Treatment Interventions DME instruction;Gait training;Functional mobility training;Therapeutic activities;Therapeutic  exercise;Balance training;Neuromuscular re-education;Patient/family education    PT Goals (Current goals can be found in the Care Plan section)  Acute Rehab PT Goals Patient Stated Goal: to get better PT Goal Formulation: With patient/family Time For Goal Achievement: 08/14/16 Potential to Achieve Goals: Good    Frequency Min 3X/week   Barriers to discharge        Co-evaluation               End of Session Equipment Utilized During Treatment: Gait belt Activity Tolerance: Patient tolerated treatment well Patient left: in bed;with call bell/phone within reach;with family/visitor present Nurse Communication: Mobility status PT Visit Diagnosis: Muscle weakness (generalized) (M62.81);Difficulty in walking, not elsewhere classified (R26.2)         Time: 4098-1191 PT Time Calculation (min) (ACUTE ONLY): 21 min   Charges:   PT Evaluation $PT Eval Moderate Complexity: 1 Procedure     PT G Codes:         Fabio Asa 08-14-2016, 5:03 PM Charlotte Crumb, PT DPT  [phone]

## 2016-07-31 NOTE — Progress Notes (Signed)
Patient had 5 beats of v-tach per telemetry monitor.  Patient states she is okay. MD at bedside and aware. Elnita Maxwell, RN

## 2016-07-31 NOTE — Progress Notes (Signed)
Verified blood with RN. Called blood bank to verify orange sticker on blood that had "Fy A" Blood bank stated that stood for possitive Duffy A antigen.  Xeng Kucher

## 2016-07-31 NOTE — Progress Notes (Signed)
Inpatient Diabetes Program Recommendations  AACE/ADA: New Consensus Statement on Inpatient Glycemic Control (2015)  Target Ranges:  Prepandial:   less than 140 mg/dL      Peak postprandial:   less than 180 mg/dL (1-2 hours)      Critically ill patients:  140 - 180 mg/dL   Lab Results  Component Value Date   GLUCAP 254 (H) 07/31/2016   HGBA1C 12.5 (H) 07/29/2016    Review of Glycemic Control  Inpatient Diabetes Program Recommendations:   Spoke with pt about A1C results 12.5 (average CBG 312 over the past 2-3 months) and explained what an A1C is, basic pathophysiology of DM Type 2, basic home care, basic diabetes diet nutrition principles, importance of checking CBGs and maintaining good CBG control to prevent long-term and short-term complications. Reviewed signs and symptoms of hyperglycemia and hypoglycemia and how to treat hypoglycemia at home. Also reviewed blood sugar goals at home.  Patient states her physician explained A1c was elevated and she has recently met with a dietician.  Nurses, please start teaching patient insulin administration in case patient is discharged on insulin.  Thank you, Nani Gasser. Adrian Specht, RN, MSN, CDE Inpatient Glycemic Control Team Team Pager [phone] (8am-5pm) 07/31/2016 4:13 PM

## 2016-08-01 ENCOUNTER — Inpatient Hospital Stay (HOSPITAL_COMMUNITY): Payer: Medicare Other

## 2016-08-01 ENCOUNTER — Ambulatory Visit: Payer: Medicare Other | Admitting: Sports Medicine

## 2016-08-01 LAB — TYPE AND SCREEN
ABO/RH(D): O POS
Antibody Screen: NEGATIVE
Unit division: 0

## 2016-08-01 LAB — BASIC METABOLIC PANEL
Anion gap: 15 (ref 5–15)
BUN: 44 mg/dL — ABNORMAL HIGH (ref 6–20)
CO2: 21 mmol/L — ABNORMAL LOW (ref 22–32)
Calcium: 9.2 mg/dL (ref 8.9–10.3)
Chloride: 99 mmol/L — ABNORMAL LOW (ref 101–111)
Creatinine, Ser: 1.57 mg/dL — ABNORMAL HIGH (ref 0.44–1.00)
GFR calc Af Amer: 36 mL/min — ABNORMAL LOW (ref >60)
GFR calc non Af Amer: 31 mL/min — ABNORMAL LOW (ref >60)
Glucose, Bld: 250 mg/dL — ABNORMAL HIGH (ref 65–99)
Potassium: 4.2 mmol/L (ref 3.5–5.1)
Sodium: 135 mmol/L (ref 135–145)

## 2016-08-01 LAB — GLUCOSE, CAPILLARY
Glucose-Capillary: 253 mg/dL — ABNORMAL HIGH (ref 65–99)
Glucose-Capillary: 179 mg/dL — ABNORMAL HIGH (ref 65–99)
Glucose-Capillary: 169 mg/dL — ABNORMAL HIGH (ref 65–99)

## 2016-08-01 LAB — CBC
HCT: 33.9 % — ABNORMAL LOW (ref 36.0–46.0)
Hemoglobin: 10.5 g/dL — ABNORMAL LOW (ref 12.0–15.0)
MCH: 24.9 pg — ABNORMAL LOW (ref 26.0–34.0)
MCHC: 31 g/dL (ref 30.0–36.0)
MCV: 80.3 fL (ref 78.0–100.0)
Platelets: 307 10*3/uL (ref 150–400)
RBC: 4.22 MIL/uL (ref 3.87–5.11)
RDW: 18.1 % — ABNORMAL HIGH (ref 11.5–15.5)
WBC: 9 10*3/uL (ref 4.0–10.5)

## 2016-08-01 LAB — HIV ANTIBODY (ROUTINE TESTING W REFLEX): HIV Screen 4th Generation wRfx: NONREACTIVE

## 2016-08-01 LAB — HEPATITIS PANEL, ACUTE
HCV Ab: <0.1 s/co ratio (ref 0.0–0.9)
Hep A IgM: NEGATIVE
Hep B C IgM: NEGATIVE
Hepatitis B Surface Ag: NEGATIVE

## 2016-08-01 LAB — BPAM RBC
Blood Product Expiration Date: 201804092359
ISSUE DATE / TIME: 201803211341
Unit Type and Rh: 5100

## 2016-08-01 LAB — ANA W/REFLEX IF POSITIVE: Anti Nuclear Antibody(ANA): NEGATIVE

## 2016-08-01 LAB — RHEUMATOID FACTOR: Rhuematoid fact SerPl-aCnc: <10 IU/mL (ref 0.0–13.9)

## 2016-08-01 LAB — UREA NITROGEN, URINE: Urea Nitrogen, Ur: 384 mg/dL

## 2016-08-01 MED ORDER — METOLAZONE 2.5 MG PO TABS
2.5000 mg | ORAL_TABLET | Freq: Once | ORAL | Status: AC
  Administered 2016-08-01: 2.5 mg via ORAL
  Filled 2016-08-01: qty 1

## 2016-08-01 MED ORDER — AZITHROMYCIN 500 MG PO TABS
500.0000 mg | ORAL_TABLET | Freq: Every day | ORAL | Status: DC
  Administered 2016-08-01 – 2016-08-05 (×5): 500 mg via ORAL
  Filled 2016-08-01 (×5): qty 1

## 2016-08-01 MED ORDER — HYDROCODONE-ACETAMINOPHEN 10-325 MG PO TABS
1.0000 | ORAL_TABLET | Freq: Four times a day (QID) | ORAL | Status: DC | PRN
  Administered 2016-08-01 – 2016-08-05 (×9): 1 via ORAL
  Filled 2016-08-01 (×10): qty 1

## 2016-08-01 MED ORDER — FUROSEMIDE 10 MG/ML IJ SOLN
120.0000 mg | Freq: Two times a day (BID) | INTRAVENOUS | Status: DC
  Administered 2016-08-01 – 2016-08-05 (×8): 120 mg via INTRAVENOUS
  Filled 2016-08-01 (×8): qty 12

## 2016-08-01 MED ORDER — INSULIN GLARGINE 100 UNIT/ML ~~LOC~~ SOLN
12.0000 [IU] | Freq: Every day | SUBCUTANEOUS | Status: DC
  Administered 2016-08-01 – 2016-08-04 (×4): 12 [IU] via SUBCUTANEOUS
  Filled 2016-08-01 (×5): qty 0.12

## 2016-08-01 MED ORDER — METOLAZONE 2.5 MG PO TABS
2.5000 mg | ORAL_TABLET | ORAL | Status: DC
  Administered 2016-08-02 – 2016-08-05 (×4): 2.5 mg via ORAL
  Filled 2016-08-01 (×4): qty 1

## 2016-08-01 MED ORDER — METOLAZONE 2.5 MG PO TABS: 2.5000 mg | ORAL_TABLET | ORAL | Status: DC

## 2016-08-01 MED ORDER — IOPAMIDOL (ISOVUE-300) INJECTION 61%
INTRAVENOUS | Status: AC
  Filled 2016-08-01: qty 30

## 2016-08-01 MED ORDER — ATORVASTATIN CALCIUM 40 MG PO TABS
40.0000 mg | ORAL_TABLET | Freq: Every day | ORAL | Status: DC
  Administered 2016-08-01 – 2016-08-04 (×4): 40 mg via ORAL
  Filled 2016-08-01 (×4): qty 1

## 2016-08-01 MED ORDER — AMOXICILLIN 500 MG PO CAPS
500.0000 mg | ORAL_CAPSULE | Freq: Three times a day (TID) | ORAL | Status: DC
  Administered 2016-08-01 – 2016-08-04 (×9): 500 mg via ORAL
  Filled 2016-08-01 (×9): qty 1

## 2016-08-01 NOTE — Progress Notes (Signed)
Pt is alert tearful at times. States that she is tired of getting up and down to the bathroom. Rated her pain 5 out of 10 at her hip gave Po hydrocodone and added humidifier to O2.

## 2016-08-01 NOTE — Progress Notes (Signed)
Family Medicine Teaching Service Daily Progress Note Intern Pager: [phone]  Patient name: Emily Miranda Medical record number: [id number] Date of birth: 09-30-1940 Age: 76 y.o. Gender: female  Primary Care Provider: Angelina Sheriff., MD Consultants: cardiology  Code Status: FULL  Pt Overview and Major Events to Date:  3/19 admitted with crushing chest pain, heparin gtt started, cards consulted  Assessment and Plan: Emily Miranda is a 76 y.o. female presenting with shortness of breath and chest pain. PMH is significant for T2DM, hx of pancreatitis, HTN, Charcot foot due to diabetes, stress urinary incontinence and hx of recurrent UTIs.    Dyspnea 2/2 CHF exacerbation vs NSTEMI. EKG 1st degree block, nonspecific T wave changes. Echo with reduced ejection fraction 40-45% and akinesis of anteroseptal wall. Troponins downtrending to 0.19. V/Q scan without PE. RLE doppler negative for DVT. UOP 775.  -Cardiology following, appreciate recs: possible cath pending Cr improvement -lasix 20m IV given this am, curious whether she need metolazone or increased lasix dose. Appears fluid overloaded on my exam today with worsening bibasilar dull breath sounds and worsening LE edema.  -daily weights, I's and O's   Chest pain, r/o ACS. Patient also describes pain as "crushing".  [facility]  Also with aortic stenosis, which may be contributing to increased demand on the heart.  Last seen by Dr. HPercival Spanishin Jan 2017 with no new changes and no repeat echo necessary at that time.  -home norco -appreciate cards recs  AKI: Cr 1.83 in the ED > 1.85>1.63>1.57. Baseline ~0.8-0.9, but Pt has not had creatinine checked recently in our system. - Holding home Lisinopril and Chlorthalidone - Holding home Celebrex - Avoid nephrotoxic agents - continue to follow BMP - FeUrea studies ordered, urine urea pending  Weight loss: patient notes unintentional 40 pound weight loss.  Discussed with PCP who reports last planned colonoscopy was 2 years ago with Dr. MOdie Seraand GEsmond Plantsis her GYN who would be managing mammograms. LFTs WNL. ESR elevated at 108. CRP elevated at 3.2. TSH wNL. HIV NR. RF <10.0. Hep panel negative. Peripheral smear thrombocytosis with anisopoikilocytosis. -repeat CXR 3/22 -ANA in process  HTN. BP elevated on admission to 151/88.  At home on Lisinopril 30 mg, Chlorthalidone 12.5 mg and Norvasc 5 mg.  -Will hold Lisinopril and Chlorthalidone in setting of AKI.  -Continue home Norvasc 5 mg daily   T2DM 2/2 pancreatitis.  Blood sugar in ED elevated to 400.  At home on Tradjenta 5 mg daily and glimepiride 4 mg daily.   Reports good compliance. Hgba1c 12.5. -lantus 12U   -sSSI for now  -CBGs 4x daily   Hx of UTIs and stress urinary incontinence.  Reports increase in frequency but denies burning on urination.  Does not feel like she fully empties her bladder. UA with rare bacteria, >500 glucose, and >300 protein.  No antibiotics needed at this time.  Patient has been on multiple antibiotics for UTI treatment but is not currently taking any.    Microcytic anemia.  Hgb 8.5 from 11.9 in Feb 2011.  MCV low at 79. Outpatient, she was started on IV infusion . FOBT negative in ED. Iron studies suggestive of iron deficiency anemia. Patient reports she saw a hematologist as an outpatient who told her that her anemia was due to her kidneys and she takes a monthly expensive shot for this (?aranesp). Transfusion threshold 8 given concern for NSTEMI, discussed this with cards today and will await their recommendations. -consider  blood tranfusion -Trend CBCs  Charcot foot due to diabetes mellitus with Diabetic ulcer.  Patient with history of ulcer on R foot for past 1.5 years.  She is followed outpatient by Podiatry. Reports several weeks ago there was concern for DVT and dopplers were performed and negative.  Dressing over R foot ulcer is c/d/I. Of note,  RLE was more swollen, warm and erythematous than LLE, concerning for DVT on presentation. -wound care consult   Constipation Last BM was yesterday.  Reports that she had to strain.  Takes Miralax at home and it helps her.   -Continue Miralax daily prn   FEN/GI: Carb-modified diet, SLIV  Prophylaxis: SCDs  Disposition: continued inpatient management of fluid overload  Subjective:  Patient with some mild improvement in chest pain. Still very curious about causes of chest pain.  Objective: Temp:  [97.4 F (36.3 C)-98.3 F (36.8 C)] 97.5 F (36.4 C) (03/22 0700) Pulse Rate:  [94-104] 95 (03/22 0700) Resp:  [17-20] 18 (03/22 0700) BP: (105-144)/(54-85) 130/82 (03/22 0700) SpO2:  [92 %-96 %] 92 % (03/22 0700) Weight:  [152 lb (68.9 kg)] 152 lb (68.9 kg) (03/22 0523) Physical Exam: General: Elderly female sitting up in bed with Sibley in place.  Cardiovascular: RRR, 2/6 systolic murmur at RUSB Respiratory: crackles in bilateral bases, easy WOB, good air movement Abdomen: SNTND, +BS Extremities: 3+ pitting edema bilaterally to knee, R midfoot bandaged, RLE not red or warm compared to L.  Laboratory:  Recent Labs Lab 07/30/16 1526 07/31/16 0443 08/01/16 0404  WBC 8.7 10.2 9.0  HGB 9.1* 7.6* 10.5*  HCT 31.4* 26.3* 33.9*  PLT 412* 519* 307    Recent Labs Lab 07/30/16 0137 07/30/16 1526 07/31/16 0443 08/01/16 0404  NA 135  --  132* 135  K 3.9  --  4.1 4.2  CL 99*  --  97* 99*  CO2 23  --  21* 21*  BUN 38*  --  42* 44*  CREATININE 1.85*  --  1.63* 1.57*  CALCIUM 8.7*  --  8.8* 9.2  PROT  --  7.4  --   --   BILITOT  --  0.2*  --   --   ALKPHOS  --  82  --   --   ALT  --  7*  --   --   AST  --  17  --   --   GLUCOSE 326*  --  302* 250*   Trop 0.53>0.38>0.34>0.3  Imaging/Diagnostic Tests: Dg Chest 2 View  Result Date: 08/01/2016 CLINICAL DATA:  Shortness of breath. EXAM: CHEST  2 VIEW COMPARISON:  07/29/2016 . FINDINGS: Mediastinum stable. Heart size stable.  Persistent left perihilar/upper lobe and bibasilar pulmonary infiltrates are again noted. Findings consistent bilateral pneumonia and/or pulmonary edema. Low lung volumes. Bilateral pleural effusions are noted. Similar findings noted on prior exam. No pneumothorax. IMPRESSION: Persistent left perihilar/left upper lobe and bibasilar pulmonary infiltrates and/or edema. Persistent bilateral pleural effusions. Similar findings noted on prior exam . Electronically Signed   By: Marcello Moores  Register   On: 08/01/2016 07:50   Nm Pulmonary Perf And Vent  Result Date: 07/30/2016 CLINICAL DATA:  Shortness of Breath EXAM: NUCLEAR MEDICINE VENTILATION - PERFUSION LUNG SCAN VIEWS: Anterior, posterior, left lateral, right lateral, RPO, LPO, RAO, LAO -ventilation and perfusion RADIOPHARMACEUTICALS:  32.6 mCi Technetium-64mDTPA aerosol inhalation and 4.16 mCi Technetium-913mAA IV COMPARISON:  Chest radiograph July 30, 2014 FINDINGS: Ventilation: Radiotracer uptake appears homogeneous and symmetric bilaterally. No focal ventilation defect  is appreciable. Perfusion: Radiotracer uptake bilaterally is prompt and symmetric. No focal perfusion defects are evident. There is no appreciable ventilation/perfusion mismatch. IMPRESSION: There are no evident ventilation or perfusion defects. Study constitutes a very low probability of pulmonary embolus. Electronically Signed   By: Lowella Grip III M.D.   On: 07/30/2016 17:16   Dg Foot 2 Views Right  Result Date: 07/31/2016 CLINICAL DATA:  Diabetic ulcer for 3 months, swelling in foot EXAM: RIGHT FOOT - 2 VIEW COMPARISON:  05/02/2016 FINDINGS: Osseous demineralization. Severe diffuse soft tissue swelling. Advanced Charcot changes involving the TMT joints and intertarsal joints with subluxations and large areas of erosion with associated fragmentation. Probable Charcot changes at the Lisfranc joints and the medial intertarsal joints, progressive since the previous study. New  inferomedial subluxation versus dislocation of the talonavicular joint. Old healed fracture of the second metatarsal shaft. Chronic periosteal thickening of the first metatarsal shaft unchanged. No acute fracture. Plantar calcaneal spurring. Acquired pes planus. Soft tissue irregularity at the posterior heel overlying the calcaneus without underlying bone destruction. IMPRESSION: Markedly progressive probable Charcot changes involving the TMT joints and medial intertarsal joints since previous exam though it is not possible to completely exclude infection with this pattern. Inferomedial talonavicular subluxation versus dislocation. Plantar calcaneal spurring without evidence of posterior calcaneal osteomyelitis. Electronically Signed   By: Lavonia Dana M.D.   On: 07/31/2016 13:13    Sela Hilding, MD 08/01/2016, 9:11 AM PGY-1, [place] Intern pager: [phone], text pages welcome

## 2016-08-01 NOTE — Progress Notes (Deleted)
Progress Note  Patient Name: Emily Miranda Date of Encounter: 08/01/2016  Primary Cardiologist: Dr. Antoine Poche  Subjective   Pt states her chest pain is the same an is minimally improved with pain medication.   Inpatient Medications    Scheduled Meds: . amLODipine  5 mg Oral Daily  . aspirin EC  81 mg Oral Daily  . furosemide  80 mg Intravenous BID  . heparin subcutaneous  5,000 Units Subcutaneous Q8H  . insulin aspart  0-9 Units Subcutaneous TID WC  . insulin glargine  10 Units Subcutaneous QHS  . sodium chloride flush  3 mL Intravenous Q12H  . tuberculin  5 Units Intradermal Once   Continuous Infusions:  PRN Meds: sodium chloride, morphine injection, nitroGLYCERIN, polyethylene glycol, sodium chloride flush, traMADol   Vital Signs    Vitals:   07/31/16 2159 08/01/16 0523 08/01/16 0700 08/01/16 0954  BP: (!) 144/84 (!) 143/85 130/82 116/62  Pulse: 98 96 95 96  Resp: 17 17 18 18   Temp: 98.3 F (36.8 C) 97.4 F (36.3 C) 97.5 F (36.4 C) 97.6 F (36.4 C)  TempSrc: Oral Oral Oral Oral  SpO2: 94% 95% 92% 90%  Weight:  152 lb (68.9 kg)    Height:        Intake/Output Summary (Last 24 hours) at 08/01/16 1035 Last data filed at 08/01/16 0925  Gross per 24 hour  Intake             1105 ml  Output              775 ml  Net              330 ml   Filed Weights   07/29/16 1200 07/31/16 0500 08/01/16 0523  Weight: 136 lb (61.7 kg) 154 lb 4.8 oz (70 kg) 152 lb (68.9 kg)     Physical Exam   General: Well developed, well nourished, female appearing in no acute distress. Head: Normocephalic, atraumatic.  Neck: Supple without bruits, mild JVD Lungs:  Resp regular and unlabored, CTA. Heart: RRR, S1, S2, no  murmur; no rub. Abdomen: Soft, non-tender, non-distended with normoactive bowel sounds. No hepatomegaly. No rebound/guarding. No obvious abdominal masses. Extremities: No clubbing, cyanosis, 1+ - 2+ LE edema. Distal pedal pulses are 1+ bilaterally. Neuro: Alert  and oriented X 3. Moves all extremities spontaneously. Psych: Normal affect.  Labs    Chemistry Recent Labs Lab 07/30/16 0137 07/30/16 1526 07/31/16 0443 08/01/16 0404  NA 135  --  132* 135  K 3.9  --  4.1 4.2  CL 99*  --  97* 99*  CO2 23  --  21* 21*  GLUCOSE 326*  --  302* 250*  BUN 38*  --  42* 44*  CREATININE 1.85*  --  1.63* 1.57*  CALCIUM 8.7*  --  8.8* 9.2  PROT  --  7.4  --   --   ALBUMIN  --  2.4*  --   --   AST  --  17  --   --   ALT  --  7*  --   --   ALKPHOS  --  82  --   --   BILITOT  --  0.2*  --   --   GFRNONAA 26*  --  30* 31*  GFRAA 30*  --  35* 36*  ANIONGAP 13  --  14 15     Hematology Recent Labs Lab 07/30/16 1526 07/31/16 0443 08/01/16 0404  WBC  8.7 10.2 9.0  RBC 3.93 3.28* 4.22  HGB 9.1* 7.6* 10.5*  HCT 31.4* 26.3* 33.9*  MCV 79.9 80.2 80.3  MCH 23.2* 23.2* 24.9*  MCHC 29.0* 28.9* 31.0  RDW 17.8* 18.0* 18.1*  PLT 412* 519* 307    Cardiac Enzymes Recent Labs Lab 07/30/16 0137 07/30/16 1120 07/30/16 1526 07/30/16 2051  TROPONINI 0.30* 0.26* 0.23* 0.19*    Recent Labs Lab 07/29/16 1110 07/29/16 1544 07/29/16 1751  TROPIPOC 0.53* 0.67* 0.74*     BNP Recent Labs Lab 07/29/16 1110  BNP 2,605.1*     DDimer  Recent Labs Lab 07/29/16 1124  DDIMER 8.35*     Radiology    Dg Chest 2 View  Result Date: 08/01/2016 CLINICAL DATA:  Shortness of breath. EXAM: CHEST  2 VIEW COMPARISON:  07/29/2016 . FINDINGS: Mediastinum stable. Heart size stable. Persistent left perihilar/upper lobe and bibasilar pulmonary infiltrates are again noted. Findings consistent bilateral pneumonia and/or pulmonary edema. Low lung volumes. Bilateral pleural effusions are noted. Similar findings noted on prior exam. No pneumothorax. IMPRESSION: Persistent left perihilar/left upper lobe and bibasilar pulmonary infiltrates and/or edema. Persistent bilateral pleural effusions. Similar findings noted on prior exam . Electronically Signed   By: Maisie Fus   Register   On: 08/01/2016 07:50   Nm Pulmonary Perf And Vent  Result Date: 07/30/2016 CLINICAL DATA:  Shortness of Breath EXAM: NUCLEAR MEDICINE VENTILATION - PERFUSION LUNG SCAN VIEWS: Anterior, posterior, left lateral, right lateral, RPO, LPO, RAO, LAO -ventilation and perfusion RADIOPHARMACEUTICALS:  32.6 mCi Technetium-33m DTPA aerosol inhalation and 4.16 mCi Technetium-87m MAA IV COMPARISON:  Chest radiograph July 30, 2014 FINDINGS: Ventilation: Radiotracer uptake appears homogeneous and symmetric bilaterally. No focal ventilation defect is appreciable. Perfusion: Radiotracer uptake bilaterally is prompt and symmetric. No focal perfusion defects are evident. There is no appreciable ventilation/perfusion mismatch. IMPRESSION: There are no evident ventilation or perfusion defects. Study constitutes a very low probability of pulmonary embolus. Electronically Signed   By: Bretta Bang III M.D.   On: 07/30/2016 17:16   Dg Foot 2 Views Right  Result Date: 07/31/2016 CLINICAL DATA:  Diabetic ulcer for 3 months, swelling in foot EXAM: RIGHT FOOT - 2 VIEW COMPARISON:  05/02/2016 FINDINGS: Osseous demineralization. Severe diffuse soft tissue swelling. Advanced Charcot changes involving the TMT joints and intertarsal joints with subluxations and large areas of erosion with associated fragmentation. Probable Charcot changes at the Lisfranc joints and the medial intertarsal joints, progressive since the previous study. New inferomedial subluxation versus dislocation of the talonavicular joint. Old healed fracture of the second metatarsal shaft. Chronic periosteal thickening of the first metatarsal shaft unchanged. No acute fracture. Plantar calcaneal spurring. Acquired pes planus. Soft tissue irregularity at the posterior heel overlying the calcaneus without underlying bone destruction. IMPRESSION: Markedly progressive probable Charcot changes involving the TMT joints and medial intertarsal joints since  previous exam though it is not possible to completely exclude infection with this pattern. Inferomedial talonavicular subluxation versus dislocation. Plantar calcaneal spurring without evidence of posterior calcaneal osteomyelitis. Electronically Signed   By: Ulyses Southward M.D.   On: 07/31/2016 13:13     Telemetry    NSR with pauses (1.3 sec) - Personally Reviewed  ECG    NSR with 1st degree block with PVCs - Personally Reviewed   Cardiac Studies   Echocardiogram 07/30/16: Study Conclusions - Left ventricle: The cavity size was normal. Wall thickness was   normal. Systolic function was mildly to moderately reduced. The   estimated ejection fraction was in the range  of 40% to 45%.   Akinesis of the midanteroseptal myocardium. Features are   consistent with a pseudonormal left ventricular filling pattern,   with concomitant abnormal relaxation and increased filling   pressure (grade 2 diastolic dysfunction). - Aortic valve: Mildly to moderately calcified annulus. Mildly   thickened, mildly calcified leaflets. There was mild stenosis.   There was trivial regurgitation. Valve area (VTI): 1.31 cm^2.   Valve area (Vmax): 1.28 cm^2. Valve area (Vmean): 1.26 cm^2. - Mitral valve: There was mild regurgitation. - Left atrium: The atrium was moderately dilated. - Pericardium, extracardiac: A trivial pericardial effusion was   identified. There was a left pleural effusion.  Patient Profile     76 y.o. female with past medical history of moderate aortic stenosis, hypertension, renal insufficiency, diabetes mellitus, chronic foot ulcer, hyperlipidemia for evaluation of chest pain and acute on chronic diastolic congestive heart failure.    Assessment & Plan    1 chest pain-Some of her symptoms atypical for [place]. Troponin was mildly elevated at 0.34 and has trended down (0.30 --> 0.26 --> 0.23 --> 0.19). VQ negative for PE, echo showed mild AS,  Grade 2 DD, and wall motion abnormality. Suspect  possible takotsubo event. Will likely need heart catheterization in the future when kidney function recovers  2 acute on chronic diastolic congestive heart failure-patient is volume overloaded on examination. Lasix to 120 mg IV twice a day did not apparently result in diuresis. I/O + 792 and wgt up ?? 18lbs.  3 history of moderate aortic stenosis-mild AS by echo. She did have a new WMA with an EF of 40-45%- ? Takotsubo   4 acute on chronic stage III kidney disease-SCr down to 1.57 (1.63) today; baseline 1.23-1.8?  5 weight loss-needs further evaluation and will leave to primary care.  6 Hypertension-continue amlodipine. ACE inhibitor on hold given renal insufficiency.   7 anemia-eval per primary care-stool negative, Hgb down to 7.6-she is still on IV Heparin. Transfused 1 U PRBC yesterday  8. Charcot foot with diabetic ulcer. Patient with history of ulcer on R foot for past 1.5 years. She is followed outpatient by Podiatry. Reports several weeks ago there was concern for DVT and dopplers were performed and negative.    Signed, Roe Rutherford Jilliam Bellmore , PA-C 10:35 AM 08/01/2016 Pager: [phone]

## 2016-08-01 NOTE — Plan of Care (Signed)
Problem: Activity: Goal: Ability to tolerate increased activity will improve Outcome: Progressing Patient is able to ambulate to [facility] without reporting increasing SOB and chest pain. Patient still experiences intermittent chest pain but occurrences have decreased with activity.

## 2016-08-01 NOTE — Progress Notes (Signed)
Progress Note  Patient Name: Emily Miranda Date of Encounter: 08/01/2016  Primary Cardiologist: Dr Antoine Poche  Subjective   Pain in left chest increased with certain arm movements; dyspnea persists  Inpatient Medications    Scheduled Meds: . amLODipine  5 mg Oral Daily  . aspirin EC  81 mg Oral Daily  . furosemide  80 mg Intravenous BID  . heparin subcutaneous  5,000 Units Subcutaneous Q8H  . insulin aspart  0-9 Units Subcutaneous TID WC  . insulin glargine  10 Units Subcutaneous QHS  . sodium chloride flush  3 mL Intravenous Q12H  . tuberculin  5 Units Intradermal Once   Continuous Infusions:  PRN Meds: sodium chloride, morphine injection, nitroGLYCERIN, polyethylene glycol, sodium chloride flush, traMADol   Vital Signs    Vitals:   07/31/16 1715 07/31/16 2159 08/01/16 0523 08/01/16 0700  BP: 132/74 (!) 144/84 (!) 143/85 130/82  Pulse: 100 98 96 95  Resp: 18 17 17 18   Temp: 97.8 F (36.6 C) 98.3 F (36.8 C) 97.4 F (36.3 C) 97.5 F (36.4 C)  TempSrc: Oral Oral Oral Oral  SpO2: 92% 94% 95% 92%  Weight:   152 lb (68.9 kg)   Height:        Intake/Output Summary (Last 24 hours) at 08/01/16 0736 Last data filed at 08/01/16 0540  Gross per 24 hour  Intake             1105 ml  Output              775 ml  Net              330 ml   Filed Weights   07/29/16 1200 07/31/16 0500 08/01/16 0523  Weight: 136 lb (61.7 kg) 154 lb 4.8 oz (70 kg) 152 lb (68.9 kg)    Telemetry    Sinus with pacs and pvcs; rare junctional escape beat - Personally Reviewed  Physical Exam   GEN: No acute distress.   Neck: Supple Cardiac: RRR, 2/6 systolic murmur Respiratory: Diminished BS throughout GI: Soft, nontender, non-distended  MS: 3+ edema; No deformity. Neuro:  Nonfocal  Psych: Normal affect   Labs    Chemistry Recent Labs Lab 07/30/16 0137 07/30/16 1526 07/31/16 0443 08/01/16 0404  NA 135  --  132* 135  K 3.9  --  4.1 4.2  CL 99*  --  97* 99*  CO2 23  --   21* 21*  GLUCOSE 326*  --  302* 250*  BUN 38*  --  42* 44*  CREATININE 1.85*  --  1.63* 1.57*  CALCIUM 8.7*  --  8.8* 9.2  PROT  --  7.4  --   --   ALBUMIN  --  2.4*  --   --   AST  --  17  --   --   ALT  --  7*  --   --   ALKPHOS  --  82  --   --   BILITOT  --  0.2*  --   --   GFRNONAA 26*  --  30* 31*  GFRAA 30*  --  35* 36*  ANIONGAP 13  --  14 15     Hematology Recent Labs Lab 07/30/16 1526 07/31/16 0443 08/01/16 0404  WBC 8.7 10.2 9.0  RBC 3.93 3.28* 4.22  HGB 9.1* 7.6* 10.5*  HCT 31.4* 26.3* 33.9*  MCV 79.9 80.2 80.3  MCH 23.2* 23.2* 24.9*  MCHC 29.0* 28.9* 31.0  RDW  17.8* 18.0* 18.1*  PLT 412* 519* 307    Cardiac Enzymes Recent Labs Lab 07/30/16 0137 07/30/16 1120 07/30/16 1526 07/30/16 2051  TROPONINI 0.30* 0.26* 0.23* 0.19*    Recent Labs Lab 07/29/16 1110 07/29/16 1544 07/29/16 1751  TROPIPOC 0.53* 0.67* 0.74*     BNP Recent Labs Lab 07/29/16 1110  BNP 2,605.1*     DDimer  Recent Labs Lab 07/29/16 1124  DDIMER 8.35*     Radiology    Nm Pulmonary Perf And Vent  Result Date: 07/30/2016 CLINICAL DATA:  Shortness of Breath EXAM: NUCLEAR MEDICINE VENTILATION - PERFUSION LUNG SCAN VIEWS: Anterior, posterior, left lateral, right lateral, RPO, LPO, RAO, LAO -ventilation and perfusion RADIOPHARMACEUTICALS:  32.6 mCi Technetium-2m DTPA aerosol inhalation and 4.16 mCi Technetium-41m MAA IV COMPARISON:  Chest radiograph July 30, 2014 FINDINGS: Ventilation: Radiotracer uptake appears homogeneous and symmetric bilaterally. No focal ventilation defect is appreciable. Perfusion: Radiotracer uptake bilaterally is prompt and symmetric. No focal perfusion defects are evident. There is no appreciable ventilation/perfusion mismatch. IMPRESSION: There are no evident ventilation or perfusion defects. Study constitutes a very low probability of pulmonary embolus. Electronically Signed   By: Bretta Bang III M.D.   On: 07/30/2016 17:16   Dg Foot 2 Views  Right  Result Date: 07/31/2016 CLINICAL DATA:  Diabetic ulcer for 3 months, swelling in foot EXAM: RIGHT FOOT - 2 VIEW COMPARISON:  05/02/2016 FINDINGS: Osseous demineralization. Severe diffuse soft tissue swelling. Advanced Charcot changes involving the TMT joints and intertarsal joints with subluxations and large areas of erosion with associated fragmentation. Probable Charcot changes at the Lisfranc joints and the medial intertarsal joints, progressive since the previous study. New inferomedial subluxation versus dislocation of the talonavicular joint. Old healed fracture of the second metatarsal shaft. Chronic periosteal thickening of the first metatarsal shaft unchanged. No acute fracture. Plantar calcaneal spurring. Acquired pes planus. Soft tissue irregularity at the posterior heel overlying the calcaneus without underlying bone destruction. IMPRESSION: Markedly progressive probable Charcot changes involving the TMT joints and medial intertarsal joints since previous exam though it is not possible to completely exclude infection with this pattern. Inferomedial talonavicular subluxation versus dislocation. Plantar calcaneal spurring without evidence of posterior calcaneal osteomyelitis. Electronically Signed   By: Ulyses Southward M.D.   On: 07/31/2016 13:13    Patient Profile     76 year old female with past medical history of moderate aortic stenosis, hypertension, renal insufficiency, diabetes mellitus, chronic foot ulcer, hyperlipidemia for evaluation of chest pain and acute on chronic diastolic congestive heart failure. Troponin minimally elevated. Echocardiogram shows ejection fraction 40-45% with akinesis of the mid anteroseptal myocardium, grade 2 diastolic dysfunction, mild aortic stenosis with mean gradient 13 mmHg, mild mitral regurgitation and moderate left atrial enlargement.   Assessment & Plan    1 chest pain-Likely Musculoskeletal (increases with arm movement). It has been persistent and  there is no clear trend with her cardiac markers and no diagnostic ST changes. There is a wall motion abnormality on her echocardiogram. I am hesitant to proceed with cardiac catheterization given renal insufficiency and risk of contrast nephropathy. Once her congestive heart failure improves we will plan nuclear study for risk stratification. If no ischemia it may be best to treat medically. Note VQ scan shows no pulmonary embolus.   2 acute on chronic combined systolic/diastolic congestive heart failure-she remains volume overloaded on exam and is not diuresing. Increase Lasix to 120 mg twice a day and add Zaroxolyn 2.5 mg daily. Follow renal function.  3 acute on  chronic stage III kidney disease-follow renal function closely.  4 Aortic stenosis-mild on follow-up echo.   5 anemia-chronic normocytic. Improved following transfusion.  6 elevated sedimentation rate and weight loss-needs further workup per primary care.   Signed, Olga Millers, MD  08/01/2016, 7:36 AM

## 2016-08-01 NOTE — Evaluation (Signed)
Occupational Therapy Evaluation and Discharge Patient Details Name: Emily Miranda MRN: [id number] DOB: [birth year] Today's Date: 08/01/2016    History of Present Illness 76 y.o. female presenting with shortness of breath and chest pain. PMH is significant for T2DM, hx of pancreatitis, HTN, Charcot foot due to diabetes, stress urinary incontinence and hx of recurrent UTIs.     Clinical Impression   This 76 yo female admitted with above presents to acute OT with deficits below (see OT problem list). However pt and husband (in room ) report pt is at baseline level of functioning except for the CP and DOE. No further OT needs, we will sign off.    Follow Up Recommendations  No OT follow up;Supervision/Assistance - 24 hour    Equipment Recommendations  None recommended by OT       Precautions / Restrictions Precautions Precautions: Fall Restrictions Weight Bearing Restrictions: No      Mobility Bed Mobility               General bed mobility comments: Pt sitting up no EOB upon my arrival  Transfers Overall transfer level: Needs assistance Equipment used: None Transfers: Sit to/from [employer] Transfers Sit to Stand: Min guard Stand pivot transfers: Min guard            Balance Overall balance assessment: Needs assistance Sitting-balance support: Feet supported Sitting balance-Leahy Scale: Good Sitting balance - Comments: able to sit unassisted and reach outside BOS     Standing balance-Leahy Scale: Fair Standing balance comment: able to stand for brief periods and perform hygiene                            ADL Overall ADL's : Needs assistance/impaired Eating/Feeding: Independent;Sitting   Grooming: Set up;Sitting   Upper Body Bathing: Set up;Sitting   Lower Body Bathing: Min guard;Sit to/from stand   Upper Body Dressing : Set up;Sitting   Lower Body Dressing: Min guard;Sit to/from stand   Toilet Transfer: Min  guard;Stand-pivot;BSC   Toileting- Architect and Hygiene: Min guard;Sit to/from stand          Pt educated on purse lipped breathing to work on increasing her O2 levels.     Vision Patient Visual Report: No change from baseline              Pertinent Vitals/Pain Pain Assessment: 0-10 Pain Score: 8  Pain Location: chest Pain Descriptors / Indicators: Crushing Pain Intervention(s): Monitored during session;Premedicated before session     Hand Dominance Right   Extremity/Trunk Assessment Upper Extremity Assessment Upper Extremity Assessment: Generalized weakness           Communication Communication Communication: HOH   Cognition Arousal/Alertness: Awake/alert Behavior During Therapy: WFL for tasks assessed/performed Overall Cognitive Status: Within Functional Limits for tasks assessed                                Home Living Family/patient expects to be discharged to:: Private residence Living Arrangements: Spouse/significant other Available Help at Discharge: Family Type of Home: Apartment Home Access: Ramped entrance     Home Layout: One level     Bathroom Shower/Tub:  (sponge bath due to RLE wound)   Bathroom Toilet: Standard     Home Equipment: Environmental consultant - 2 wheels;Walker - 4 wheels;Other (comment) (transport chair)          Prior Functioning/Environment Level of  Independence: Needs assistance  Gait / Transfers Assistance Needed: assist for trasfers to transport chair, can minimally ambulate from chair to toilet with increased assist              OT Problem List: Decreased strength;Decreased activity tolerance;Impaired balance (sitting and/or standing);Pain         OT Goals(Current goals can be found in the care plan section) Acute Rehab OT Goals Patient Stated Goal: to get my foot better so I can walk again  OT Frequency:                End of Session Nurse Communication:  (nurse student to let RN know (Pt  on 1 liter O2 upon my arrival O2 92%, after getting to [facility] and back noted by with increased work of breathing O2 check and pt 83%--turned up to 2liters with O2 sats coming up to 92%--left pt on 2 liters)  Activity Tolerance: Patient tolerated treatment well Patient left:  (sitting EOB)  OT Visit Diagnosis: Unsteadiness on feet (R26.81)                ADL either performed or assessed with clinical judgement  Time: 1610-9604 OT Time Calculation (min): 23 min Charges:  OT General Charges $OT Visit: 1 Procedure OT Evaluation $OT Eval Moderate Complexity: 1 Procedure OT Treatments $Self Care/Home Management : 8-22 mins Ignacia Palma, OTR/L [phone] 08/01/2016

## 2016-08-01 NOTE — Progress Notes (Signed)
Pt is alert and oriented wants to wait until tonight to take 1800 medis due ct scan at [facility]

## 2016-08-02 LAB — GLUCOSE, CAPILLARY
Glucose-Capillary: 122 mg/dL — ABNORMAL HIGH (ref 65–99)
Glucose-Capillary: 107 mg/dL — ABNORMAL HIGH (ref 65–99)
Glucose-Capillary: 158 mg/dL — ABNORMAL HIGH (ref 65–99)
Glucose-Capillary: 138 mg/dL — ABNORMAL HIGH (ref 65–99)

## 2016-08-02 LAB — CBC
HCT: 33.6 % — ABNORMAL LOW (ref 36.0–46.0)
Hemoglobin: 10.2 g/dL — ABNORMAL LOW (ref 12.0–15.0)
MCH: 24.4 pg — ABNORMAL LOW (ref 26.0–34.0)
MCHC: 30.4 g/dL (ref 30.0–36.0)
MCV: 80.4 fL (ref 78.0–100.0)
Platelets: 519 10*3/uL — ABNORMAL HIGH (ref 150–400)
RBC: 4.18 MIL/uL (ref 3.87–5.11)
RDW: 17.9 % — ABNORMAL HIGH (ref 11.5–15.5)
WBC: 7.9 10*3/uL (ref 4.0–10.5)

## 2016-08-02 LAB — BASIC METABOLIC PANEL
Anion gap: 13 (ref 5–15)
BUN: 43 mg/dL — ABNORMAL HIGH (ref 6–20)
CO2: 24 mmol/L (ref 22–32)
Calcium: 9 mg/dL (ref 8.9–10.3)
Chloride: 95 mmol/L — ABNORMAL LOW (ref 101–111)
Creatinine, Ser: 1.38 mg/dL — ABNORMAL HIGH (ref 0.44–1.00)
GFR calc Af Amer: 42 mL/min — ABNORMAL LOW (ref >60)
GFR calc non Af Amer: 37 mL/min — ABNORMAL LOW (ref >60)
Glucose, Bld: 179 mg/dL — ABNORMAL HIGH (ref 65–99)
Potassium: 4.6 mmol/L (ref 3.5–5.1)
Sodium: 132 mmol/L — ABNORMAL LOW (ref 135–145)

## 2016-08-02 NOTE — Progress Notes (Signed)
Pt PPD skin test read. Skin is flat with no induration or erythema. PPD negative. FMTS paged regarding negative results.

## 2016-08-02 NOTE — Discharge Summary (Signed)
[facility] Discharge Summary  Patient name: Emily Miranda Medical record number: [id number] Date of birth: 05-Aug-1940 Age: 76 y.o. Gender: female Date of Admission: 07/29/2016  Date of Discharge: 08/05/16 Admitting Physician: Leeanne Rio, MD  Primary Care Provider: Angelina Sheriff., MD Consultants: cardiology  Indication for Hospitalization: chest pain  Discharge Diagnoses/Problem List:  CHF exacerbation, improving Chest pain, resolved Multifocal PNA AKI, resolving Weight loss HTN T2DM Microcytic anemia, s/p 1U PRBC Charcot foot due to DM with diabetic ulcer Constipation  Disposition: home  Discharge Condition: stable  Discharge Exam: see progress note from day of discharge  Brief Hospital Course:  Patient presented with SOB and chest pain, off and on for years but noticed more day of presentation at rest and exacerbated by walking to restroom. Chest pain was described as L sided, crushing, 10/10. No home O2, but required 3L by [place] on presentation.  In ED, patient was hypertensive to 155/87. HRs elevated to 112. She required 2L O2 by [place] for desaturations to 89%. Labs significant for Cr 1.83 (BL ?0.8-0.9), Hgb 8.5, MCV 79.6, BNP 2605, d-dimer 8.35, trop 0.38 > 0.67. UA with >500 glucose, >300 protein, negative LE, negative nitrites. EKG with sinus tachycardia but no signs of ischemia/infarct. CXR with bilateral lower lobe opacities. Cardiology was consulted and Pt was started on a Heparin drip. Given IV lasix 16m x2 in ED. Patient with AKI (Cr 1.83 and baseline 0.9-1.1), so V/q scan was ordered. RLE doppler did not find DVT, and V/q scan eventually did not find PE. Troponins were trended (0.38>>0.19), and cardiology recommended stopping heparin drip on HD #3. Nuclear stress test was considered, but deferred by patient's prior outpatient cardiology as CP had resolved with treatment of pneumonia and diuresis.   CHF exacerbation: Initially  treated with IV lasix 83mx2, but patient required 12079mV lasix and metolazone to achieve adequate diuresis. Discharged with 25m85m BID and continued metolazone with close cardiology follow up.   Multifocal pneumonia: Discovered on CT chest, abdomen, and pelvis conducted for weight loss. Treated with amoxicillin and azithromycin. O2 requirement resolved after antibiotics and patient's chest pain also resolved. Seven day course prescribed.  Anemia: Hemoglobin trended down to 7.6, so patient was given 1U PRBC. Iron studies consistent with anemia of chronic disease. Patient's outpatient hematologist was consulted as patient reported aranesp as an outpatient, who recommended transfusion but deferred additional aranesp to outpatient.  Weight loss workup: patient noted unintentional weight loss of 40 pounds over the last 6 months. Workup included - ANA (neg), CRP (3.2), TSH (3.72), Hepatitis panel negative, RF negative, ESR 108 > 80, HIV nonreactive. FOBT negative.   AKI: patient told by hematology that she had anemia of chronic disease. Discussed case with this MD who recommended transfusion, but no inpatient aranesp. No outpatient nephrology per patient. Baseline Cr 1 month prior to admission 1.1. Patient with max Cr of 1.83 on admission, trended down to 1.52 with diuresis.   Issues for Follow Up:  1. Follow up incidental imaging finding of mild ectasia of the ascending thoracic aorta measuring 3.7 cm in AP diameter unchanged. Recommend annual imaging followup by CTA or MRA. 2. Recommend rechecking BMP for AoCKD. If Cr continuing to normalize would consider restarting lisinopril as BP tolerates.  3. Discharged on lasix PO 25mg86mand metolazone 2.5mg Q24mPatient to have close follow up with cardiology after discharge to manage this. If patient unable to follow up with them, would recommend decreasing lasix  as UOP, edema and Cr allows. 4. Needs continued outpatient hematology follow up. May require  additional aranesp doses.   5. Needs continued outpatient workup of weight loss - particularly colonoscopy. CT chest, abdomen, pelvis without acute pathology.  Significant Procedures: V/Q scan - no PE.   Significant Labs and Imaging:   Recent Labs Lab 08/03/16 0421 08/04/16 0450 08/05/16 0513  WBC 7.5 7.6 7.1  HGB 9.1* 8.5* 8.7*  HCT 30.3* 28.3* 29.3*  PLT 452* 399 387    Recent Labs Lab 08/02/16 0232 08/03/16 0421 08/04/16 0450 08/05/16 0513  NA 132* 137 133* 137  K 4.6 3.6 3.7 3.5  CL 95* 97* 93* 96*  CO2 _0 GLUCOSE 179* 80 332* 170*  BUN 43* 42* 46* 43*  CREATININE 1.38* 1.43* 1.63* 1.52*  CALCIUM 9.0 9.0 8.6* 8.9    Workup for unintentional weight loss:  ANA negative ESR 108 > 80  CRP 3.2  TSH 3.72 Hepatitis panel   No results found.  Results/Tests Pending at Time of Discharge: none  Discharge Medications:  Allergies as of 08/05/2016      Reactions   Levofloxacin Nausea And Vomiting   Other reaction(s): Confusion   Gabapentin Other (See Comments)   Disoriented, no strength in legs      Medication List    STOP taking these medications   ALKA SELTZER PLUS PO   celecoxib 400 MG capsule Commonly known as:  CELEBREX   chlorthalidone 25 MG tablet Commonly known as:  HYGROTON   diphenoxylate-atropine 2.5-0.025 MG tablet Commonly known as:  LOMOTIL   EXCEDRIN MIGRAINE PO   fexofenadine 180 MG tablet Commonly known as:  ALLEGRA   gabapentin 300 MG capsule Commonly known as:  NEURONTIN   glimepiride 4 MG tablet Commonly known as:  AMARYL   HYDROcodone-acetaminophen 10-325 MG tablet Commonly known as:  NORCO   HYDROcodone-acetaminophen 5-325 MG tablet Commonly known as:  NORCO   lidocaine 5 % ointment Commonly known as:  XYLOCAINE   linagliptin 5 MG Tabs tablet Commonly known as:  TRADJENTA   lisinopril 30 MG tablet Commonly known as:  PRINIVIL,ZESTRIL   lubiprostone 8 MCG capsule Commonly known as:  AMITIZA    methocarbamol 500 MG tablet Commonly known as:  ROBAXIN   naproxen 500 MG tablet Commonly known as:  NAPROSYN   nitrofurantoin (macrocrystal-monohydrate) 100 MG capsule Commonly known as:  MACROBID   pregabalin 75 MG capsule Commonly known as:  LYRICA   promethazine 25 MG tablet Commonly known as:  PHENERGAN     TAKE these medications   amLODipine 5 MG tablet Commonly known as:  NORVASC Take 5 mg by mouth daily.   aspirin 81 MG EC tablet Take 1 tablet (81 mg total) by mouth daily.   atorvastatin 40 MG tablet Commonly known as:  LIPITOR Take 1 tablet (40 mg total) by mouth daily at 6 PM.   azithromycin 500 MG tablet Commonly known as:  ZITHROMAX Take 1 tablet (500 mg total) by mouth daily.   docusate sodium 100 MG capsule Commonly known as:  COLACE TAKE 1 TABLET BY MOUTH EVERY 8 HOURS, AS NEEDED FOR CONSTIPATION   fluticasone 50 MCG/ACT nasal spray Commonly known as:  FLONASE Place 1 spray into both nostrils daily as needed for allergies or rhinitis (seasonal allergies).   furosemide 80 MG tablet Commonly known as:  LASIX Take 1 tablet (80 mg total) by mouth 2 (two) times daily.   insulin glargine 100 UNIT/ML injection Commonly  known as:  LANTUS Inject 0.12 mLs (12 Units total) into the skin at bedtime.   metolazone 2.5 MG tablet Commonly known as:  ZAROXOLYN Take 1 tablet (2.5 mg total) by mouth daily.   omeprazole 20 MG capsule Commonly known as:  PRILOSEC Take 20 mg by mouth daily.   ONE TOUCH ULTRA TEST test strip Generic drug:  glucose blood 1 each by Other route once a week.   polyethylene glycol packet Commonly known as:  MIRALAX / GLYCOLAX Take 17 g by mouth daily as needed (constipation). Mix in 8 oz liquid and drink   SYSTANE OP Place 1 drop into both eyes daily as needed (dry eyes).   traMADol 50 MG tablet Commonly known as:  ULTRAM Take 1 tablet (50 mg total) by mouth every 8 (eight) hours as needed. What changed:  when to take  this  reasons to take this  Another medication with the same name was removed. Continue taking this medication, and follow the directions you see here.   vitamin C 1000 MG tablet Take 1,000 mg by mouth daily.     ASK your doctor about these medications   amoxicillin 500 MG capsule Commonly known as:  AMOXIL Take 1 capsule (500 mg total) by mouth every 12 (twelve) hours. Ask about: Should I take this medication?   insulin starter kit- pen needles Misc 1 kit by Other route once. Ask about: Should I take this medication?       Discharge Instructions: Please refer to Patient Instructions section of EMR for full details.  Patient was counseled important signs and symptoms that should prompt return to medical care, changes in medications, dietary instructions, activity restrictions, and follow up appointments.   Follow-Up Appointments: Follow-up Information    [facility] Angelique Blonder., MD On 08/13/2016.   Specialty:  Family Medicine Why:  At 11:10am for a hospital follow up  Contact information: [place] [id number] [phone]           Sela Hilding, MD 08/08/2016, 6:56 AM PGY-1, [place]

## 2016-08-02 NOTE — Care Management Important Message (Signed)
Important Message  Patient Details  Name: Emily Miranda MRN: [id number] Date of Birth: 26-Oct-1940   Medicare Important Message Given:  Yes    Dorena Bodo 08/02/2016, 10:41 AM

## 2016-08-02 NOTE — Care Management Note (Signed)
Case Management Note  Patient Details  Name: Emily Miranda MRN: [id number] Date of Birth: December 29, 1940  Subjective/Objective:       Admitted with SOB            Action/Plan: Patient lives at home with her spouse/ family; Primary Care Provider: Noni Saupe., MD ; has private insurance with [facility] with prescription drug coverage; pharmacy of choice is [facility]; patient stated that she is using a walker at home due to an ulcer on her foot, usually she does not use any DME at home; Noted PT recommendations for HHPT, patient refused all HHC at this time. CM will continue to follow at this time.  Expected Discharge Date:  Possibly 08/05/2016              Expected Discharge Plan:  Home w Home Health Services  Discharge planning Services  CM Consult  Choice offered to:  Patient  HH Arranged:  Patient Refused HH   Status of Service:  In process, will continue to follow [name] [phone] 08/02/2016, 3:22 PM

## 2016-08-02 NOTE — Progress Notes (Signed)
Family Medicine Teaching Service Daily Progress Note Intern Pager: [phone]  Patient name: Emily Miranda Medical record number: [id number] Date of birth: Dec 20, 1940 Age: 76 y.o. Gender: female  Primary Care Provider: Angelina Sheriff., MD Consultants: cardiology  Code Status: FULL  Pt Overview and Major Events to Date:  3/19 admitted with crushing chest pain, heparin gtt started, cards consulted  Assessment and Plan: Emily Miranda is a 76 y.o. female presenting with shortness of breath and chest pain. PMH is significant for T2DM, hx of pancreatitis, HTN, Charcot foot due to diabetes, stress urinary incontinence and hx of recurrent UTIs.    Dyspnea 2/2 CHF exacerbation vs NSTEMI. EKG 1st degree block, nonspecific T wave changes. Echo with reduced ejection fraction 40-45% and akinesis of anteroseptal wall. UOP 2900. Weight down 1 lb. Patient and family would like to adjust lasix dose timing to avoid nocturnal diuresis. -Cardiology following, appreciate recs: possible cath pending Cr improvement -lasix '120mg'$  IV, added metolazone -daily weights, I's and O's   Chest pain, r/o ACS. Patient also describes pain as "crushing".  Follows outpatient with [facility].   -home norco -appreciate cards recs: possible nuclear stress test after additional diuresis  AKI: Cr 1.83 in the ED >>1.38. Baseline ~1.1 per outside records review. FeUrea 16% suggesting prerenal cause likely secondary to decreased perfusion 2/2 CHF exacerbation. - Holding home Lisinopril and Chlorthalidone - Holding home Celebrex - Avoid nephrotoxic agents - continue to follow BMP  Weight loss: patient notes unintentional 40 pound weight loss. Discussed with PCP who reports last planned colonoscopy was 2 years ago with Dr. Odie Sera and Esmond Plants is her GYN who would be managing mammograms. LFTs WNL. ESR elevated at 108. CRP elevated at 3.2. TSH wNL. HIV NR. RF <10.0. Hep panel negative. Peripheral smear  thrombocytosis with anisopoikilocytosis. ANA neg. CXR with multifocal pneumonia -PPD pending read this afternoon -continue workup as an outpatient  HTN. BP elevated on admission to 151/88.  At home on Lisinopril 30 mg, Chlorthalidone 12.5 mg and Norvasc 5 mg.  -Will hold Lisinopril and Chlorthalidone in setting of AKI.  -Continue home Norvasc 5 mg daily   T2DM 2/2 pancreatitis.  Blood sugar in ED elevated to 400.  At home on Tradjenta 5 mg daily and glimepiride 4 mg daily.   Reports good compliance. Hgba1c 12.5. -lantus 12U   -sSSI for now  -CBGs 4x daily   Microcytic anemia.  Hgb 8.5 from 11.9 in Feb 2011.  MCV low at 79. Outpatient, she was started on IV infusion . FOBT negative in ED. Iron studies suggestive of iron deficiency anemia. Patient reports she saw a hematologist as an outpatient who told her that her anemia was due to her kidneys and she takes a monthly expensive shot for this (?aranesp).  -stable on CBC today  Charcot foot due to diabetes mellitus with Diabetic ulcer.  Patient with history of ulcer on R foot for past 1.5 years.  She is followed outpatient by Podiatry. Reports several weeks ago there was concern for DVT and dopplers were performed and negative.  Dressing over R foot ulcer is c/d/I. Of note, RLE was more swollen, warm and erythematous than LLE, concerning for DVT on presentation. -wound care consult   Constipation Last BM was yesterday.  Reports that she had to strain.  Takes Miralax at home and it helps her.   -Continue Miralax daily prn   FEN/GI: Carb-modified diet, SLIV  Prophylaxis: SCDs  Disposition: continued inpatient management of fluid overload  Subjective:  Patient upset that she had to urinate all night. She feels mildly improved in terms of chest pain. She is happy that CT abd did not show anything concerning.  Objective: Temp:  [97.5 F (36.4 C)-97.7 F (36.5 C)] 97.7 F (36.5 C) (03/23 0434) Pulse Rate:  [80-106] 94 (03/23  0434) Resp:  [16-18] 16 (03/23 0434) BP: (116-146)/(62-79) 146/79 (03/23 0434) SpO2:  [90 %-97 %] 97 % (03/23 0434) Weight:  [151 lb 3.2 oz (68.6 kg)] 151 lb 3.2 oz (68.6 kg) (03/23 0434) Physical Exam: General: Elderly female sitting up in bed with Moffat in place.  Cardiovascular: RRR, 2/6 systolic murmur at RUSB Respiratory: crackles in bilateral bases, easy WOB, good air movement Abdomen: SNTND, +BS Extremities: 3+ pitting edema bilaterally to knee, R midfoot bandaged, RLE not red or warm compared to L.  Laboratory:  Recent Labs Lab 07/31/16 0443 08/01/16 0404 08/02/16 0232  WBC 10.2 9.0 7.9  HGB 7.6* 10.5* 10.2*  HCT 26.3* 33.9* 33.6*  PLT 519* 307 519*    Recent Labs Lab 07/30/16 1526 07/31/16 0443 08/01/16 0404 08/02/16 0232  NA  --  132* 135 132*  K  --  4.1 4.2 4.6  CL  --  97* 99* 95*  CO2  --  21* 21* 24  BUN  --  42* 44* 43*  CREATININE  --  1.63* 1.57* 1.38*  CALCIUM  --  8.8* 9.2 9.0  PROT 7.4  --   --   --   BILITOT 0.2*  --   --   --   ALKPHOS 82  --   --   --   ALT 7*  --   --   --   AST 17  --   --   --   GLUCOSE  --  302* 250* 179*   Trop 0.53>0.38>0.34>0.3  Imaging/Diagnostic Tests: Ct Abdomen Pelvis Wo Contrast  Result Date: 08/01/2016 CLINICAL DATA:  Abdominal pain for 4 days EXAM: CT ABDOMEN AND PELVIS WITHOUT CONTRAST TECHNIQUE: Multidetector CT imaging of the abdomen and pelvis was performed following the standard protocol without IV contrast. COMPARISON:  Chest CT 08/01/2016, CT abdomen 08/08/2014 FINDINGS: Lower chest: Moderate bilateral pleural effusions and partial consolidation in the lower lobes which may reflect pneumonia, grossly unchanged. Atelectasis or infiltrate in the lingula. Borderline heart size. Coronary artery calcification. Trace pericardial effusion. Hepatobiliary: No focal hepatic abnormality. Possible tiny stones in the gallbladder. No biliary dilatation. No wall thickening. Pancreas: Unremarkable. No pancreatic ductal  dilatation or surrounding inflammatory changes. Spleen: Normal in size without focal abnormality. Adrenals/Urinary Tract: Adrenal glands are within normal limits. No hydronephrosis. No calcified stones. Bladder normal. Stomach/Bowel: Stomach is nonenlarged. No dilated small bowel. No colon wall thickening. Moderate stool burden. Appendix is non identified. Vascular/Lymphatic: Aortic atherosclerosis. No enlarged abdominal or pelvic lymph nodes. Reproductive: Status post hysterectomy. No adnexal masses. Other: No free air. No significant free fluid. Evidence of prior lower anterior hernia repair. Musculoskeletal: Stable grade 1 anterolisthesis of L4 on L5. Multilevel degenerative changes. No acute or suspicious bone lesion. IMPRESSION: 1. Moderate bilateral pleural effusions and partial consolidation in the lower lobes which may reflect pneumonia, not significantly changed compared with recent chest CT 2. No definite CT evidence for acute intra-abdominal or pelvic pathology 3. Possible tiny stones in the gallbladder Electronically Signed   By: Donavan Foil M.D.   On: 08/01/2016 22:57   Dg Chest 2 View  Result Date: 08/01/2016 CLINICAL DATA:  Shortness of breath. EXAM: CHEST  2 VIEW COMPARISON:  07/29/2016 . FINDINGS: Mediastinum stable. Heart size stable. Persistent left perihilar/upper lobe and bibasilar pulmonary infiltrates are again noted. Findings consistent bilateral pneumonia and/or pulmonary edema. Low lung volumes. Bilateral pleural effusions are noted. Similar findings noted on prior exam. No pneumothorax. IMPRESSION: Persistent left perihilar/left upper lobe and bibasilar pulmonary infiltrates and/or edema. Persistent bilateral pleural effusions. Similar findings noted on prior exam . Electronically Signed   By: Marcello Moores  Register   On: 08/01/2016 07:50   Ct Chest Wo Contrast  Result Date: 08/01/2016 CLINICAL DATA:  Shortness of breath.  CHF. EXAM: CT CHEST WITHOUT CONTRAST TECHNIQUE: Multidetector  CT imaging of the chest was performed following the standard protocol without IV contrast. COMPARISON:  CT 05/27/2016 FINDINGS: Cardiovascular: Mild cardiomegaly with small amount pericardial fluid. Mild calcified plaque over the left anterior descending lateral circumflex coronary arteries. Calcified plaque over the thoracic aorta. Ascending thoracic aorta measures 3.7 cm in AP diameter without significant change. Mediastinum/Nodes: 1.3 cm precarinal lymph node and 1.1 cm AP window lymph node. No significant hilar adenopathy. Remaining mediastinal structures are within normal. Lungs/Pleura: Lungs are adequately inflated demonstrate moderate patchy airspace opacification over the left lung with minimal opacification over the posterior right base and lateral right upper lobe as findings are new and suggest multifocal pneumonia. Small to moderate size bilateral pleural effusions with associated compressive atelectasis in the bases. The airways are within normal. Upper Abdomen: Unchanged. Musculoskeletal: Mild compression deformity over the upper thoracic spine and moderate compression deformity over the mid thoracic spine unchanged. IMPRESSION: Multifocal airspace process worse over the left lung with small to moderate bilateral pleural effusions and bibasilar atelectasis. Findings are likely due to a multifocal pneumonia. Mild reactive mediastinal adenopathy. Recommend follow-up to resolution. Mild cardiomegaly with tiny amount of pericardial fluid. Aortic atherosclerosis. Mild atherosclerotic coronary artery disease. Mild ectasia of the ascending thoracic aorta measuring 3.7 cm in AP diameter unchanged. Recommend annual imaging followup by CTA or MRA. This recommendation follows 2010 ACCF/AHA/AATS/ACR/ASA/SCA/SCAI/SIR/STS/SVM Guidelines for the Diagnosis and Management of Patients with Thoracic Aortic Disease. Circulation.2010; 121: M226-J335. Stable thoracic spine compression fractures. Electronically Signed   By:  Marin Olp M.D.   On: 08/01/2016 14:29   Dg Foot 2 Views Right  Result Date: 07/31/2016 CLINICAL DATA:  Diabetic ulcer for 3 months, swelling in foot EXAM: RIGHT FOOT - 2 VIEW COMPARISON:  05/02/2016 FINDINGS: Osseous demineralization. Severe diffuse soft tissue swelling. Advanced Charcot changes involving the TMT joints and intertarsal joints with subluxations and large areas of erosion with associated fragmentation. Probable Charcot changes at the Lisfranc joints and the medial intertarsal joints, progressive since the previous study. New inferomedial subluxation versus dislocation of the talonavicular joint. Old healed fracture of the second metatarsal shaft. Chronic periosteal thickening of the first metatarsal shaft unchanged. No acute fracture. Plantar calcaneal spurring. Acquired pes planus. Soft tissue irregularity at the posterior heel overlying the calcaneus without underlying bone destruction. IMPRESSION: Markedly progressive probable Charcot changes involving the TMT joints and medial intertarsal joints since previous exam though it is not possible to completely exclude infection with this pattern. Inferomedial talonavicular subluxation versus dislocation. Plantar calcaneal spurring without evidence of posterior calcaneal osteomyelitis. Electronically Signed   By: Lavonia Dana M.D.   On: 07/31/2016 13:13    Sela Hilding, MD 08/02/2016, 7:54 AM PGY-1, [place] Intern pager: [phone], text pages welcome

## 2016-08-02 NOTE — Progress Notes (Signed)
PT Cancellation Note  Patient Details Name: Emily Miranda MRN: [id number] DOB: 11/24/1940   Cancelled Treatment:    Reason Eval/Treat Not Completed: Fatigue/lethargy limiting ability to participate (pt stated she was already up to the recliner today and that she wants to rest right now. Encouraged pt to request assist to recliner each day over the weekend. Will follow. )   Tamala Ser 08/02/2016, 2:06 PM [phone]

## 2016-08-02 NOTE — Progress Notes (Signed)
Progress Note  Patient Name: Emily Miranda Date of Encounter: 08/02/2016  Primary Cardiologist: Dr Antoine Poche  Subjective   Pain in left chest increased with certain arm movements; dyspnea improving  Inpatient Medications    Scheduled Meds: . amLODipine  5 mg Oral Daily  . amoxicillin  500 mg Oral Q8H  . aspirin EC  81 mg Oral Daily  . atorvastatin  40 mg Oral q1800  . azithromycin  500 mg Oral Daily  . furosemide  120 mg Intravenous BID  . heparin subcutaneous  5,000 Units Subcutaneous Q8H  . insulin aspart  0-9 Units Subcutaneous TID WC  . insulin glargine  12 Units Subcutaneous QHS  . metolazone  2.5 mg Oral Q24H  . sodium chloride flush  3 mL Intravenous Q12H  . tuberculin  5 Units Intradermal Once   Continuous Infusions:  PRN Meds: sodium chloride, HYDROcodone-acetaminophen, nitroGLYCERIN, polyethylene glycol, sodium chloride flush, traMADol   Vital Signs    Vitals:   08/01/16 0954 08/01/16 1100 08/01/16 2044 08/02/16 0434  BP: 116/62 133/77 133/78 (!) 146/79  Pulse: 96 80 (!) 106 94  Resp: 18  16 16   Temp: 97.6 F (36.4 C)  97.5 F (36.4 C) 97.7 F (36.5 C)  TempSrc: Oral  Oral Oral  SpO2: 90% 95% 93% 97%  Weight:    151 lb 3.2 oz (68.6 kg)  Height:        Intake/Output Summary (Last 24 hours) at 08/02/16 0908 Last data filed at 08/02/16 0610  Gross per 24 hour  Intake              600 ml  Output             2900 ml  Net            -2300 ml   Filed Weights   07/31/16 0500 08/01/16 0523 08/02/16 0434  Weight: 154 lb 4.8 oz (70 kg) 152 lb (68.9 kg) 151 lb 3.2 oz (68.6 kg)    Telemetry    Sinus with pacs and pvcs; rare junctional escape beat - Personally Reviewed  Physical Exam   GEN: No acute distress.   Neck: Supple Cardiac: RRR, 2/6 systolic murmur Respiratory: Diminished BS bases GI: Soft, nontender, non-distended  MS: 2+ edema Neuro:  Nonfocal  Psych: Normal affect   Labs    Chemistry  Recent Labs Lab 07/30/16 1526  07/31/16 0443 08/01/16 0404 08/02/16 0232  NA  --  132* 135 132*  K  --  4.1 4.2 4.6  CL  --  97* 99* 95*  CO2  --  21* 21* 24  GLUCOSE  --  302* 250* 179*  BUN  --  42* 44* 43*  CREATININE  --  1.63* 1.57* 1.38*  CALCIUM  --  8.8* 9.2 9.0  PROT 7.4  --   --   --   ALBUMIN 2.4*  --   --   --   AST 17  --   --   --   ALT 7*  --   --   --   ALKPHOS 82  --   --   --   BILITOT 0.2*  --   --   --   GFRNONAA  --  30* 31* 37*  GFRAA  --  35* 36* 42*  ANIONGAP  --  14 15 13      Hematology  Recent Labs Lab 07/31/16 0443 08/01/16 0404 08/02/16 0232  WBC 10.2 9.0 7.9  RBC  3.28* 4.22 4.18  HGB 7.6* 10.5* 10.2*  HCT 26.3* 33.9* 33.6*  MCV 80.2 80.3 80.4  MCH 23.2* 24.9* 24.4*  MCHC 28.9* 31.0 30.4  RDW 18.0* 18.1* 17.9*  PLT 519* 307 519*    Cardiac Enzymes  Recent Labs Lab 07/30/16 0137 07/30/16 1120 07/30/16 1526 07/30/16 2051  TROPONINI 0.30* 0.26* 0.23* 0.19*     Recent Labs Lab 07/29/16 1110 07/29/16 1544 07/29/16 1751  TROPIPOC 0.53* 0.67* 0.74*     BNP  Recent Labs Lab 07/29/16 1110  BNP 2,605.1*     DDimer   Recent Labs Lab 07/29/16 1124  DDIMER 8.35*     Radiology    Ct Abdomen Pelvis Wo Contrast  Result Date: 08/01/2016 CLINICAL DATA:  Abdominal pain for 4 days EXAM: CT ABDOMEN AND PELVIS WITHOUT CONTRAST TECHNIQUE: Multidetector CT imaging of the abdomen and pelvis was performed following the standard protocol without IV contrast. COMPARISON:  Chest CT 08/01/2016, CT abdomen 08/08/2014 FINDINGS: Lower chest: Moderate bilateral pleural effusions and partial consolidation in the lower lobes which may reflect pneumonia, grossly unchanged. Atelectasis or infiltrate in the lingula. Borderline heart size. Coronary artery calcification. Trace pericardial effusion. Hepatobiliary: No focal hepatic abnormality. Possible tiny stones in the gallbladder. No biliary dilatation. No wall thickening. Pancreas: Unremarkable. No pancreatic ductal dilatation  or surrounding inflammatory changes. Spleen: Normal in size without focal abnormality. Adrenals/Urinary Tract: Adrenal glands are within normal limits. No hydronephrosis. No calcified stones. Bladder normal. Stomach/Bowel: Stomach is nonenlarged. No dilated small bowel. No colon wall thickening. Moderate stool burden. Appendix is non identified. Vascular/Lymphatic: Aortic atherosclerosis. No enlarged abdominal or pelvic lymph nodes. Reproductive: Status post hysterectomy. No adnexal masses. Other: No free air. No significant free fluid. Evidence of prior lower anterior hernia repair. Musculoskeletal: Stable grade 1 anterolisthesis of L4 on L5. Multilevel degenerative changes. No acute or suspicious bone lesion. IMPRESSION: 1. Moderate bilateral pleural effusions and partial consolidation in the lower lobes which may reflect pneumonia, not significantly changed compared with recent chest CT 2. No definite CT evidence for acute intra-abdominal or pelvic pathology 3. Possible tiny stones in the gallbladder Electronically Signed   By: Jasmine Pang M.D.   On: 08/01/2016 22:57   Dg Chest 2 View  Result Date: 08/01/2016 CLINICAL DATA:  Shortness of breath. EXAM: CHEST  2 VIEW COMPARISON:  07/29/2016 . FINDINGS: Mediastinum stable. Heart size stable. Persistent left perihilar/upper lobe and bibasilar pulmonary infiltrates are again noted. Findings consistent bilateral pneumonia and/or pulmonary edema. Low lung volumes. Bilateral pleural effusions are noted. Similar findings noted on prior exam. No pneumothorax. IMPRESSION: Persistent left perihilar/left upper lobe and bibasilar pulmonary infiltrates and/or edema. Persistent bilateral pleural effusions. Similar findings noted on prior exam . Electronically Signed   By: Maisie Fus  Register   On: 08/01/2016 07:50   Ct Chest Wo Contrast  Result Date: 08/01/2016 CLINICAL DATA:  Shortness of breath.  CHF. EXAM: CT CHEST WITHOUT CONTRAST TECHNIQUE: Multidetector CT imaging  of the chest was performed following the standard protocol without IV contrast. COMPARISON:  CT 05/27/2016 FINDINGS: Cardiovascular: Mild cardiomegaly with small amount pericardial fluid. Mild calcified plaque over the left anterior descending lateral circumflex coronary arteries. Calcified plaque over the thoracic aorta. Ascending thoracic aorta measures 3.7 cm in AP diameter without significant change. Mediastinum/Nodes: 1.3 cm precarinal lymph node and 1.1 cm AP window lymph node. No significant hilar adenopathy. Remaining mediastinal structures are within normal. Lungs/Pleura: Lungs are adequately inflated demonstrate moderate patchy airspace opacification over the left lung with minimal  opacification over the posterior right base and lateral right upper lobe as findings are new and suggest multifocal pneumonia. Small to moderate size bilateral pleural effusions with associated compressive atelectasis in the bases. The airways are within normal. Upper Abdomen: Unchanged. Musculoskeletal: Mild compression deformity over the upper thoracic spine and moderate compression deformity over the mid thoracic spine unchanged. IMPRESSION: Multifocal airspace process worse over the left lung with small to moderate bilateral pleural effusions and bibasilar atelectasis. Findings are likely due to a multifocal pneumonia. Mild reactive mediastinal adenopathy. Recommend follow-up to resolution. Mild cardiomegaly with tiny amount of pericardial fluid. Aortic atherosclerosis. Mild atherosclerotic coronary artery disease. Mild ectasia of the ascending thoracic aorta measuring 3.7 cm in AP diameter unchanged. Recommend annual imaging followup by CTA or MRA. This recommendation follows 2010 ACCF/AHA/AATS/ACR/ASA/SCA/SCAI/SIR/STS/SVM Guidelines for the Diagnosis and Management of Patients with Thoracic Aortic Disease. Circulation.2010; 121: L935-T017. Stable thoracic spine compression fractures. Electronically Signed   By: Elberta Fortis M.D.   On: 08/01/2016 14:29   Dg Foot 2 Views Right  Result Date: 07/31/2016 CLINICAL DATA:  Diabetic ulcer for 3 months, swelling in foot EXAM: RIGHT FOOT - 2 VIEW COMPARISON:  05/02/2016 FINDINGS: Osseous demineralization. Severe diffuse soft tissue swelling. Advanced Charcot changes involving the TMT joints and intertarsal joints with subluxations and large areas of erosion with associated fragmentation. Probable Charcot changes at the Lisfranc joints and the medial intertarsal joints, progressive since the previous study. New inferomedial subluxation versus dislocation of the talonavicular joint. Old healed fracture of the second metatarsal shaft. Chronic periosteal thickening of the first metatarsal shaft unchanged. No acute fracture. Plantar calcaneal spurring. Acquired pes planus. Soft tissue irregularity at the posterior heel overlying the calcaneus without underlying bone destruction. IMPRESSION: Markedly progressive probable Charcot changes involving the TMT joints and medial intertarsal joints since previous exam though it is not possible to completely exclude infection with this pattern. Inferomedial talonavicular subluxation versus dislocation. Plantar calcaneal spurring without evidence of posterior calcaneal osteomyelitis. Electronically Signed   By: Ulyses Southward M.D.   On: 07/31/2016 13:13    Patient Profile     76 year old female with past medical history of moderate aortic stenosis, hypertension, renal insufficiency, diabetes mellitus, chronic foot ulcer, hyperlipidemia for evaluation of chest pain and acute on chronic diastolic congestive heart failure. Troponin minimally elevated. Echocardiogram shows ejection fraction 40-45% with akinesis of the mid anteroseptal myocardium (new), grade 2 diastolic dysfunction, mild aortic stenosis with mean gradient 13 mmHg, mild mitral regurgitation and moderate left atrial enlargement.   Assessment & Plan    1 chest pain-Likely  Musculoskeletal (increases with arm movement). It has been persistent and there is no clear trend with her cardiac markers and no diagnostic ST changes. There is a wall motion abnormality on her echocardiogram. I am hesitant to proceed with cardiac catheterization given renal insufficiency and risk of contrast nephropathy. Once her congestive heart failure improves we will plan nuclear study for risk stratification (possibly Monday). If no ischemia it may be best to treat medically. Note VQ scan shows no pulmonary embolus.   2 acute on chronic combined systolic/diastolic congestive heart failure-Pt is now diuresing (I and O -2300; wt 151). Continue Lasix 120 mg twice a day and Zaroxolyn 2.5 mg daily. Follow renal function.  3 acute on chronic stage III kidney disease-follow renal function closely.  4 Aortic stenosis-mild on follow-up echo.   5 anemia-chronic normocytic. Improved following transfusion.  6 elevated sedimentation rate and weight loss-needs further workup per primary care.  Signed, Olga Millers, MD  08/02/2016, 9:08 AM

## 2016-08-03 LAB — BASIC METABOLIC PANEL
Anion gap: 14 (ref 5–15)
BUN: 42 mg/dL — ABNORMAL HIGH (ref 6–20)
CO2: 26 mmol/L (ref 22–32)
Calcium: 9 mg/dL (ref 8.9–10.3)
Chloride: 97 mmol/L — ABNORMAL LOW (ref 101–111)
Creatinine, Ser: 1.43 mg/dL — ABNORMAL HIGH (ref 0.44–1.00)
GFR calc Af Amer: 41 mL/min — ABNORMAL LOW (ref >60)
GFR calc non Af Amer: 35 mL/min — ABNORMAL LOW (ref >60)
Glucose, Bld: 80 mg/dL (ref 65–99)
Potassium: 3.6 mmol/L (ref 3.5–5.1)
Sodium: 137 mmol/L (ref 135–145)

## 2016-08-03 LAB — GLUCOSE, CAPILLARY
Glucose-Capillary: 91 mg/dL (ref 65–99)
Glucose-Capillary: 296 mg/dL — ABNORMAL HIGH (ref 65–99)
Glucose-Capillary: 207 mg/dL — ABNORMAL HIGH (ref 65–99)
Glucose-Capillary: 183 mg/dL — ABNORMAL HIGH (ref 65–99)

## 2016-08-03 LAB — CBC
HCT: 30.3 % — ABNORMAL LOW (ref 36.0–46.0)
Hemoglobin: 9.1 g/dL — ABNORMAL LOW (ref 12.0–15.0)
MCH: 24.1 pg — ABNORMAL LOW (ref 26.0–34.0)
MCHC: 30 g/dL (ref 30.0–36.0)
MCV: 80.2 fL (ref 78.0–100.0)
Platelets: 452 10*3/uL — ABNORMAL HIGH (ref 150–400)
RBC: 3.78 MIL/uL — ABNORMAL LOW (ref 3.87–5.11)
RDW: 18.6 % — ABNORMAL HIGH (ref 11.5–15.5)
WBC: 7.5 10*3/uL (ref 4.0–10.5)

## 2016-08-03 NOTE — Plan of Care (Signed)
Problem: Pain Managment: Goal: General experience of comfort will improve Outcome: Progressing Has chronic pain however shortness of breath is improving   Problem: Tissue Perfusion: Goal: Risk factors for ineffective tissue perfusion will decrease Outcome: Progressing Able to wean patient from Oxygen, maintaining sats in the low 90's on room air   Problem: Fluid Volume: Goal: Ability to maintain a balanced intake and output will improve Outcome: Progressing Continues to diurese

## 2016-08-03 NOTE — Progress Notes (Signed)
Patient getting up from [facility] with assistance from RN, RN noted coccyx/sacrum area with some erythema.  Allevyn foam dressing applied.

## 2016-08-03 NOTE — Progress Notes (Signed)
Progress Note  Patient Name: Emily Miranda Date of Encounter: 08/03/2016  Primary Cardiologist: Dr. Antoine Poche  Subjective   Weak.  No chest pain.  Breathing is OK but not at baseline.   Inpatient Medications    Scheduled Meds: . amLODipine  5 mg Oral Daily  . amoxicillin  500 mg Oral Q8H  . aspirin EC  81 mg Oral Daily  . atorvastatin  40 mg Oral q1800  . azithromycin  500 mg Oral Daily  . furosemide  120 mg Intravenous BID  . heparin subcutaneous  5,000 Units Subcutaneous Q8H  . insulin aspart  0-9 Units Subcutaneous TID WC  . insulin glargine  12 Units Subcutaneous QHS  . metolazone  2.5 mg Oral Q24H  . sodium chloride flush  3 mL Intravenous Q12H   Continuous Infusions:  PRN Meds: sodium chloride, HYDROcodone-acetaminophen, nitroGLYCERIN, polyethylene glycol, sodium chloride flush, traMADol   Vital Signs    Vitals:   08/02/16 2056 08/03/16 0619 08/03/16 0850 08/03/16 0924  BP: 131/69 125/63 128/60   Pulse: 95 79 (!) 102   Resp: 18 18    Temp: 97.9 F (36.6 C) 97.5 F (36.4 C)    TempSrc: Oral Oral    SpO2: 98% 96% 98% 93%  Weight:  142 lb 14.4 oz (64.8 kg)    Height:        Intake/Output Summary (Last 24 hours) at 08/03/16 1014 Last data filed at 08/03/16 0941  Gross per 24 hour  Intake              484 ml  Output             3800 ml  Net            -3316 ml   Filed Weights   08/01/16 0523 08/02/16 0434 08/03/16 0619  Weight: 152 lb (68.9 kg) 151 lb 3.2 oz (68.6 kg) 142 lb 14.4 oz (64.8 kg)    Telemetry    NSR, sinus tach.  - Personally Reviewed  ECG    NA - Personally Reviewed  Physical Exam   GEN: No acute distress.   Neck: No  JVD Cardiac: RRR with ectopy, 2/6 apical systolic , no diastolic murmurs, rubs, or gallops.  Respiratory: Clear  to auscultation bilaterally. GI: Soft, nontender, non-distended  MS: Mild edema;  Neuro:  Nonfocal  Psych: Normal affect   Labs    Chemistry Recent Labs Lab 07/30/16 1526  08/01/16 0404  08/02/16 0232 08/03/16 0421  NA  --   < > 135 132* 137  K  --   < > 4.2 4.6 3.6  CL  --   < > 99* 95* 97*  CO2  --   < > 21* 24 26  GLUCOSE  --   < > 250* 179* 80  BUN  --   < > 44* 43* 42*  CREATININE  --   < > 1.57* 1.38* 1.43*  CALCIUM  --   < > 9.2 9.0 9.0  PROT 7.4  --   --   --   --   ALBUMIN 2.4*  --   --   --   --   AST 17  --   --   --   --   ALT 7*  --   --   --   --   ALKPHOS 82  --   --   --   --   BILITOT 0.2*  --   --   --   --  GFRNONAA  --   < > 31* 37* 35*  GFRAA  --   < > 36* 42* 41*  ANIONGAP  --   < > 15 13 14   < > = values in this interval not displayed.   Hematology Recent Labs Lab 08/01/16 0404 08/02/16 0232 08/03/16 0421  WBC 9.0 7.9 7.5  RBC 4.22 4.18 3.78*  HGB 10.5* 10.2* 9.1*  HCT 33.9* 33.6* 30.3*  MCV 80.3 80.4 80.2  MCH 24.9* 24.4* 24.1*  MCHC 31.0 30.4 30.0  RDW 18.1* 17.9* 18.6*  PLT 307 519* 452*    Cardiac Enzymes Recent Labs Lab 07/30/16 0137 07/30/16 1120 07/30/16 1526 07/30/16 2051  TROPONINI 0.30* 0.26* 0.23* 0.19*    Recent Labs Lab 07/29/16 1110 07/29/16 1544 07/29/16 1751  TROPIPOC 0.53* 0.67* 0.74*     BNP Recent Labs Lab 07/29/16 1110  BNP 2,605.1*     DDimer  Recent Labs Lab 07/29/16 1124  DDIMER 8.35*     Radiology    Ct Abdomen Pelvis Wo Contrast  Result Date: 08/01/2016 CLINICAL DATA:  Abdominal pain for 4 days EXAM: CT ABDOMEN AND PELVIS WITHOUT CONTRAST TECHNIQUE: Multidetector CT imaging of the abdomen and pelvis was performed following the standard protocol without IV contrast. COMPARISON:  Chest CT 08/01/2016, CT abdomen 08/08/2014 FINDINGS: Lower chest: Moderate bilateral pleural effusions and partial consolidation in the lower lobes which may reflect pneumonia, grossly unchanged. Atelectasis or infiltrate in the lingula. Borderline heart size. Coronary artery calcification. Trace pericardial effusion. Hepatobiliary: No focal hepatic abnormality. Possible tiny stones in the gallbladder. No  biliary dilatation. No wall thickening. Pancreas: Unremarkable. No pancreatic ductal dilatation or surrounding inflammatory changes. Spleen: Normal in size without focal abnormality. Adrenals/Urinary Tract: Adrenal glands are within normal limits. No hydronephrosis. No calcified stones. Bladder normal. Stomach/Bowel: Stomach is nonenlarged. No dilated small bowel. No colon wall thickening. Moderate stool burden. Appendix is non identified. Vascular/Lymphatic: Aortic atherosclerosis. No enlarged abdominal or pelvic lymph nodes. Reproductive: Status post hysterectomy. No adnexal masses. Other: No free air. No significant free fluid. Evidence of prior lower anterior hernia repair. Musculoskeletal: Stable grade 1 anterolisthesis of L4 on L5. Multilevel degenerative changes. No acute or suspicious bone lesion. IMPRESSION: 1. Moderate bilateral pleural effusions and partial consolidation in the lower lobes which may reflect pneumonia, not significantly changed compared with recent chest CT 2. No definite CT evidence for acute intra-abdominal or pelvic pathology 3. Possible tiny stones in the gallbladder Electronically Signed   By: Jasmine Pang M.D.   On: 08/01/2016 22:57   Ct Chest Wo Contrast  Result Date: 08/01/2016 CLINICAL DATA:  Shortness of breath.  CHF. EXAM: CT CHEST WITHOUT CONTRAST TECHNIQUE: Multidetector CT imaging of the chest was performed following the standard protocol without IV contrast. COMPARISON:  CT 05/27/2016 FINDINGS: Cardiovascular: Mild cardiomegaly with small amount pericardial fluid. Mild calcified plaque over the left anterior descending lateral circumflex coronary arteries. Calcified plaque over the thoracic aorta. Ascending thoracic aorta measures 3.7 cm in AP diameter without significant change. Mediastinum/Nodes: 1.3 cm precarinal lymph node and 1.1 cm AP window lymph node. No significant hilar adenopathy. Remaining mediastinal structures are within normal. Lungs/Pleura: Lungs are  adequately inflated demonstrate moderate patchy airspace opacification over the left lung with minimal opacification over the posterior right base and lateral right upper lobe as findings are new and suggest multifocal pneumonia. Small to moderate size bilateral pleural effusions with associated compressive atelectasis in the bases. The airways are within normal. Upper Abdomen: Unchanged. Musculoskeletal: Mild compression  deformity over the upper thoracic spine and moderate compression deformity over the mid thoracic spine unchanged. IMPRESSION: Multifocal airspace process worse over the left lung with small to moderate bilateral pleural effusions and bibasilar atelectasis. Findings are likely due to a multifocal pneumonia. Mild reactive mediastinal adenopathy. Recommend follow-up to resolution. Mild cardiomegaly with tiny amount of pericardial fluid. Aortic atherosclerosis. Mild atherosclerotic coronary artery disease. Mild ectasia of the ascending thoracic aorta measuring 3.7 cm in AP diameter unchanged. Recommend annual imaging followup by CTA or MRA. This recommendation follows 2010 ACCF/AHA/AATS/ACR/ASA/SCA/SCAI/SIR/STS/SVM Guidelines for the Diagnosis and Management of Patients with Thoracic Aortic Disease. Circulation.2010; 121: Z610-R604. Stable thoracic spine compression fractures. Electronically Signed   By: Elberta Fortis M.D.   On: 08/01/2016 14:29    Cardiac Studies   ECHO (08/01/16) Study Conclusions  - Left ventricle: The cavity size was normal. Wall thickness was   normal. Systolic function was mildly to moderately reduced. The   estimated ejection fraction was in the range of 40% to 45%.   Akinesis of the midanteroseptal myocardium. Features are   consistent with a pseudonormal left ventricular filling pattern,   with concomitant abnormal relaxation and increased filling   pressure (grade 2 diastolic dysfunction). - Aortic valve: Mildly to moderately calcified annulus. Mildly    thickened, mildly calcified leaflets. There was mild stenosis.   There was trivial regurgitation. Valve area (VTI): 1.31 cm^2.   Valve area (Vmax): 1.28 cm^2. Valve area (Vmean): 1.26 cm^2. - Mitral valve: There was mild regurgitation. - Left atrium: The atrium was moderately dilated. - Pericardium, extracardiac: A trivial pericardial effusion was   identified. There was a left pleural effusion.   Patient Profile     76 y.o. female with past medical history of moderate aortic stenosis, hypertension, renal insufficiency, diabetes mellitus, chronic foot ulcer, hyperlipidemia for evaluation of chest pain and acute on chronic diastolic congestive heart failure. Troponin minimally elevated. Echocardiogram shows ejection fraction 40-45% with akinesis of the mid anteroseptal myocardium (new), grade 2 diastolic dysfunction, mild aortic stenosis with mean gradient 13 mmHg, mild mitral regurgitation and moderate left atrial enlargement.   Assessment & Plan    CHEST PAIN:  Not thought to be anginal.  No plan for cardiac work up.  Not currently complaining of chest pain.   ACUTE COMBINED SYSTOLIC AND DIASTOLIC HF:  Down 4.3 liters since admit.  Weight is down about 12 lbs.  OK to continue current dose of diuretic.    ACUTE ON CHRONIC CKD STAGE III:  Creat is relatively stable today.  Follow creatinine.   AORTIC STENOSIS MILD:  This has been mild by echo and clinically.  No change in therapy planned.     Signed, Rollene Rotunda, MD  08/03/2016, 10:14 AM

## 2016-08-03 NOTE — Progress Notes (Signed)
Family Medicine Teaching Service Daily Progress Note Intern Pager: [phone]  Patient name: Emily Miranda Medical record number: [id number] Date of birth: 1941-04-09 Age: 76 y.o. Gender: female  Primary Care Provider: Angelina Sheriff., MD Consultants: cardiology  Code Status: FULL  Pt Overview and Major Events to Date:  3/19 admitted with crushing chest pain, heparin gtt started, cards consulted  Assessment and Plan: Emily Miranda is a 76 y.o. female presenting with shortness of breath and chest pain. PMH is significant for T2DM, hx of pancreatitis, HTN, Charcot foot due to diabetes, stress urinary incontinence and hx of recurrent UTIs.    CHF exacerbation:   UOP 3400. Weight down 9 lbs. No difficulty with overnight urination last night. -Cardiology following, appreciate recs: continue diuresis, possible nuclear stress test 3/26 pending Cr improvement -lasix '120mg'$  IV, continue metolazone -daily weights, I's and O's  -consider foley if persistent bothersome nocturnal diuresis  Chest pain, r/o ACS. Patient also describes pain as "crushing".  Follows outpatient with [facility].   -home norco -appreciate cards recs: possible nuclear stress test after additional diuresis  Multifocal pneumonia: found on CT chest. No longer requiring O2. Treating as CAP.  -continue amox and azithro day 3 of 7 -monitor fevers  AKI: Cr 1.83 in the ED >>1.38>1.43. Baseline ~1.1 per outside records review. FeUrea 16% suggesting prerenal cause likely secondary to decreased perfusion 2/2 CHF exacerbation. - Holding home Lisinopril and Chlorthalidone - Holding home Celebrex - Avoid nephrotoxic agents - continue to follow BMP  Weight loss: patient notes unintentional 40 pound weight loss. Discussed with PCP who reports last planned colonoscopy was 2 years ago with Dr. Odie Sera and Emily Miranda is her GYN who would be managing mammograms. LFTs WNL. ESR elevated at 108. CRP elevated at 3.2.  TSH wNL. HIV NR. RF <10.0. Hep panel negative. Peripheral smear thrombocytosis with anisopoikilocytosis. ANA neg. CXR with multifocal pneumonia. PPD negative. -continue workup as an outpatient  HTN. BP elevated on admission to 151/88.  At home on Lisinopril 30 mg, Chlorthalidone 12.5 mg and Norvasc 5 mg.  -Will hold Lisinopril and Chlorthalidone in setting of AKI.  -Continue home Norvasc 5 mg daily   T2DM 2/2 pancreatitis.  Blood sugar in ED elevated to 400.  At home on Tradjenta 5 mg daily and glimepiride 4 mg daily.   Reports good compliance. Hgba1c 12.5. -lantus 12U   -sSSI -CBGs 4x daily   Microcytic anemia.  Hgb 8.5 from 11.9 in Feb 2011.  MCV low at 79. Outpatient, she was started on IV infusion . FOBT negative in ED. Iron studies suggestive of iron deficiency anemia. Patient reports she saw a hematologist as an outpatient who told her that her anemia was due to her kidneys and she takes a monthly expensive shot for this (?aranesp).  -stable on CBC today  Charcot foot due to diabetes mellitus with Diabetic ulcer.  Patient with history of ulcer on R foot for past 1.5 years.  She is followed outpatient by Podiatry. Reports several weeks ago there was concern for DVT and dopplers were performed and negative.  Dressing over R foot ulcer is c/d/I. Of note, RLE was more swollen, warm and erythematous than LLE, concerning for DVT on presentation. -wound care consult   Constipation: Takes Miralax at home and it helps her.   -Continue Miralax daily prn   FEN/GI: Carb-modified diet, SLIV  Prophylaxis: SCDs  Disposition: continued inpatient management of fluid overload  Subjective:  Patient off O2, just reports that she  is tired. Has continued to have good UOP, understands plan for nuclear stress on Monday if Cr ok.  Objective: Temp:  [97.5 F (36.4 C)-97.9 F (36.6 C)] 97.5 F (36.4 C) (03/24 0619) Pulse Rate:  [79-102] 102 (03/24 0850) Resp:  [18] 18 (03/24 0619) BP:  (117-131)/(60-84) 128/60 (03/24 0850) SpO2:  [93 %-98 %] 93 % (03/24 0924) Weight:  [142 lb 14.4 oz (64.8 kg)] 142 lb 14.4 oz (64.8 kg) (03/24 2725) Physical Exam: General: Elderly female sitting up in bed in NAD.  Cardiovascular: RRR, 2/6 systolic murmur at RUSB Respiratory: mild crackles in bilateral bases, easy WOB, good air movement Abdomen: SNTND, +BS Extremities: 2+ pitting edema bilaterally to knee, R midfoot bandaged.  Laboratory:  Recent Labs Lab 08/01/16 0404 08/02/16 0232 08/03/16 0421  WBC 9.0 7.9 7.5  HGB 10.5* 10.2* 9.1*  HCT 33.9* 33.6* 30.3*  PLT 307 519* 452*    Recent Labs Lab 07/30/16 1526  08/01/16 0404 08/02/16 0232 08/03/16 0421  NA  --   < > 135 132* 137  K  --   < > 4.2 4.6 3.6  CL  --   < > 99* 95* 97*  CO2  --   < > 21* 24 26  BUN  --   < > 44* 43* 42*  CREATININE  --   < > 1.57* 1.38* 1.43*  CALCIUM  --   < > 9.2 9.0 9.0  PROT 7.4  --   --   --   --   BILITOT 0.2*  --   --   --   --   ALKPHOS 82  --   --   --   --   ALT 7*  --   --   --   --   AST 17  --   --   --   --   GLUCOSE  --   < > 250* 179* 80  < > = values in this interval not displayed. Trop 0.53>0.38>0.34>0.3  Imaging/Diagnostic Tests: Ct Abdomen Pelvis Wo Contrast  Result Date: 08/01/2016 CLINICAL DATA:  Abdominal pain for 4 days EXAM: CT ABDOMEN AND PELVIS WITHOUT CONTRAST TECHNIQUE: Multidetector CT imaging of the abdomen and pelvis was performed following the standard protocol without IV contrast. COMPARISON:  Chest CT 08/01/2016, CT abdomen 08/08/2014 FINDINGS: Lower chest: Moderate bilateral pleural effusions and partial consolidation in the lower lobes which may reflect pneumonia, grossly unchanged. Atelectasis or infiltrate in the lingula. Borderline heart size. Coronary artery calcification. Trace pericardial effusion. Hepatobiliary: No focal hepatic abnormality. Possible tiny stones in the gallbladder. No biliary dilatation. No wall thickening. Pancreas: Unremarkable. No  pancreatic ductal dilatation or surrounding inflammatory changes. Spleen: Normal in size without focal abnormality. Adrenals/Urinary Tract: Adrenal glands are within normal limits. No hydronephrosis. No calcified stones. Bladder normal. Stomach/Bowel: Stomach is nonenlarged. No dilated small bowel. No colon wall thickening. Moderate stool burden. Appendix is non identified. Vascular/Lymphatic: Aortic atherosclerosis. No enlarged abdominal or pelvic lymph nodes. Reproductive: Status post hysterectomy. No adnexal masses. Other: No free air. No significant free fluid. Evidence of prior lower anterior hernia repair. Musculoskeletal: Stable grade 1 anterolisthesis of L4 on L5. Multilevel degenerative changes. No acute or suspicious bone lesion. IMPRESSION: 1. Moderate bilateral pleural effusions and partial consolidation in the lower lobes which may reflect pneumonia, not significantly changed compared with recent chest CT 2. No definite CT evidence for acute intra-abdominal or pelvic pathology 3. Possible tiny stones in the gallbladder Electronically Signed   By: Maudie Mercury  Francoise Ceo M.D.   On: 08/01/2016 22:57   Ct Chest Wo Contrast  Result Date: 08/01/2016 CLINICAL DATA:  Shortness of breath.  CHF. EXAM: CT CHEST WITHOUT CONTRAST TECHNIQUE: Multidetector CT imaging of the chest was performed following the standard protocol without IV contrast. COMPARISON:  CT 05/27/2016 FINDINGS: Cardiovascular: Mild cardiomegaly with small amount pericardial fluid. Mild calcified plaque over the left anterior descending lateral circumflex coronary arteries. Calcified plaque over the thoracic aorta. Ascending thoracic aorta measures 3.7 cm in AP diameter without significant change. Mediastinum/Nodes: 1.3 cm precarinal lymph node and 1.1 cm AP window lymph node. No significant hilar adenopathy. Remaining mediastinal structures are within normal. Lungs/Pleura: Lungs are adequately inflated demonstrate moderate patchy airspace opacification  over the left lung with minimal opacification over the posterior right base and lateral right upper lobe as findings are new and suggest multifocal pneumonia. Small to moderate size bilateral pleural effusions with associated compressive atelectasis in the bases. The airways are within normal. Upper Abdomen: Unchanged. Musculoskeletal: Mild compression deformity over the upper thoracic spine and moderate compression deformity over the mid thoracic spine unchanged. IMPRESSION: Multifocal airspace process worse over the left lung with small to moderate bilateral pleural effusions and bibasilar atelectasis. Findings are likely due to a multifocal pneumonia. Mild reactive mediastinal adenopathy. Recommend follow-up to resolution. Mild cardiomegaly with tiny amount of pericardial fluid. Aortic atherosclerosis. Mild atherosclerotic coronary artery disease. Mild ectasia of the ascending thoracic aorta measuring 3.7 cm in AP diameter unchanged. Recommend annual imaging followup by CTA or MRA. This recommendation follows 2010 ACCF/AHA/AATS/ACR/ASA/SCA/SCAI/SIR/STS/SVM Guidelines for the Diagnosis and Management of Patients with Thoracic Aortic Disease. Circulation.2010; 121: H209-O709. Stable thoracic spine compression fractures. Electronically Signed   By: Marin Olp M.D.   On: 08/01/2016 14:29    Sela Hilding, MD 08/03/2016, 9:53 AM PGY-1, [place] Intern pager: [phone], text pages welcome

## 2016-08-04 LAB — CBC
HCT: 28.3 % — ABNORMAL LOW (ref 36.0–46.0)
Hemoglobin: 8.5 g/dL — ABNORMAL LOW (ref 12.0–15.0)
MCH: 24.4 pg — ABNORMAL LOW (ref 26.0–34.0)
MCHC: 30 g/dL (ref 30.0–36.0)
MCV: 81.3 fL (ref 78.0–100.0)
Platelets: 399 10*3/uL (ref 150–400)
RBC: 3.48 MIL/uL — ABNORMAL LOW (ref 3.87–5.11)
RDW: 19 % — ABNORMAL HIGH (ref 11.5–15.5)
WBC: 7.6 10*3/uL (ref 4.0–10.5)

## 2016-08-04 LAB — GLUCOSE, CAPILLARY
Glucose-Capillary: 267 mg/dL — ABNORMAL HIGH (ref 65–99)
Glucose-Capillary: 229 mg/dL — ABNORMAL HIGH (ref 65–99)
Glucose-Capillary: 221 mg/dL — ABNORMAL HIGH (ref 65–99)
Glucose-Capillary: 249 mg/dL — ABNORMAL HIGH (ref 65–99)

## 2016-08-04 LAB — BASIC METABOLIC PANEL
Anion gap: 12 (ref 5–15)
BUN: 46 mg/dL — ABNORMAL HIGH (ref 6–20)
CO2: 28 mmol/L (ref 22–32)
Calcium: 8.6 mg/dL — ABNORMAL LOW (ref 8.9–10.3)
Chloride: 93 mmol/L — ABNORMAL LOW (ref 101–111)
Creatinine, Ser: 1.63 mg/dL — ABNORMAL HIGH (ref 0.44–1.00)
GFR calc Af Amer: 35 mL/min — ABNORMAL LOW (ref >60)
GFR calc non Af Amer: 30 mL/min — ABNORMAL LOW (ref >60)
Glucose, Bld: 332 mg/dL — ABNORMAL HIGH (ref 65–99)
Potassium: 3.7 mmol/L (ref 3.5–5.1)
Sodium: 133 mmol/L — ABNORMAL LOW (ref 135–145)

## 2016-08-04 LAB — SEDIMENTATION RATE: Sed Rate: 80 mm/hr — ABNORMAL HIGH (ref 0–22)

## 2016-08-04 MED ORDER — MUSCLE RUB 10-15 % EX CREA
TOPICAL_CREAM | CUTANEOUS | Status: DC | PRN
  Filled 2016-08-04: qty 85

## 2016-08-04 MED ORDER — AMOXICILLIN 500 MG PO CAPS
500.0000 mg | ORAL_CAPSULE | Freq: Two times a day (BID) | ORAL | Status: DC
  Administered 2016-08-04 – 2016-08-05 (×2): 500 mg via ORAL
  Filled 2016-08-04 (×2): qty 1

## 2016-08-04 NOTE — Progress Notes (Signed)
Progress Note  Patient Name: Emily Miranda Date of Encounter: 08/04/2016  Primary Cardiologist: Dr. Antoine Poche  Subjective   No chest pain.  Breathing is OK.  She hopes to go home tomorrow.   Inpatient Medications    Scheduled Meds: . amLODipine  5 mg Oral Daily  . amoxicillin  500 mg Oral Q8H  . aspirin EC  81 mg Oral Daily  . atorvastatin  40 mg Oral q1800  . azithromycin  500 mg Oral Daily  . furosemide  120 mg Intravenous BID  . heparin subcutaneous  5,000 Units Subcutaneous Q8H  . insulin aspart  0-9 Units Subcutaneous TID WC  . insulin glargine  12 Units Subcutaneous QHS  . metolazone  2.5 mg Oral Q24H  . sodium chloride flush  3 mL Intravenous Q12H   Continuous Infusions:  PRN Meds: sodium chloride, HYDROcodone-acetaminophen, nitroGLYCERIN, polyethylene glycol, sodium chloride flush, traMADol   Vital Signs    Vitals:   08/03/16 1754 08/03/16 2028 08/04/16 0635 08/04/16 0904  BP:  126/75 116/64 (!) 144/71  Pulse:  96 88 95  Resp:  18 18   Temp:  97.8 F (36.6 C) 97.6 F (36.4 C)   TempSrc:  Oral Oral   SpO2: 96% 94% 90% 99%  Weight:   141 lb 8 oz (64.2 kg)   Height:        Intake/Output Summary (Last 24 hours) at 08/04/16 0909 Last data filed at 08/04/16 0900  Gross per 24 hour  Intake              864 ml  Output             2300 ml  Net            -1436 ml   Filed Weights   08/02/16 0434 08/03/16 0619 08/04/16 0635  Weight: 151 lb 3.2 oz (68.6 kg) 142 lb 14.4 oz (64.8 kg) 141 lb 8 oz (64.2 kg)    Telemetry    NSR.  - Personally Reviewed  ECG    NA - Personally Reviewed  Physical Exam   GEN: No acute distress.   Neck: No  JVD Cardiac: RRR with ectopy, 2/6 apical systolic , no diastolic murmurs, rubs. Respiratory: Clear to auscultation bilaterally. GI: Soft, nontender, non-distended  MS: Mild left greater than right edema;  Neuro:  Nonfocal  Psych: Normal affect   Labs    Chemistry Recent Labs Lab 07/30/16 1526   08/02/16 0232 08/03/16 0421 08/04/16 0450  NA  --   < > 132* 137 133*  K  --   < > 4.6 3.6 3.7  CL  --   < > 95* 97* 93*  CO2  --   < > 24 26 28   GLUCOSE  --   < > 179* 80 332*  BUN  --   < > 43* 42* 46*  CREATININE  --   < > 1.38* 1.43* 1.63*  CALCIUM  --   < > 9.0 9.0 8.6*  PROT 7.4  --   --   --   --   ALBUMIN 2.4*  --   --   --   --   AST 17  --   --   --   --   ALT 7*  --   --   --   --   ALKPHOS 82  --   --   --   --   BILITOT 0.2*  --   --   --   --  GFRNONAA  --   < > 37* 35* 30*  GFRAA  --   < > 42* 41* 35*  ANIONGAP  --   < > 13 14 12   < > = values in this interval not displayed.   Hematology  Recent Labs Lab 08/02/16 0232 08/03/16 0421 08/04/16 0450  WBC 7.9 7.5 7.6  RBC 4.18 3.78* 3.48*  HGB 10.2* 9.1* 8.5*  HCT 33.6* 30.3* 28.3*  MCV 80.4 80.2 81.3  MCH 24.4* 24.1* 24.4*  MCHC 30.4 30.0 30.0  RDW 17.9* 18.6* 19.0*  PLT 519* 452* 399    Cardiac Enzymes  Recent Labs Lab 07/30/16 0137 07/30/16 1120 07/30/16 1526 07/30/16 2051  TROPONINI 0.30* 0.26* 0.23* 0.19*     Recent Labs Lab 07/29/16 1110 07/29/16 1544 07/29/16 1751  TROPIPOC 0.53* 0.67* 0.74*     BNP  Recent Labs Lab 07/29/16 1110  BNP 2,605.1*     DDimer   Recent Labs Lab 07/29/16 1124  DDIMER 8.35*     Radiology    No results found.  Cardiac Studies   ECHO (08/01/16) Study Conclusions  - Left ventricle: The cavity size was normal. Wall thickness was   normal. Systolic function was mildly to moderately reduced. The   estimated ejection fraction was in the range of 40% to 45%.   Akinesis of the midanteroseptal myocardium. Features are   consistent with a pseudonormal left ventricular filling pattern,   with concomitant abnormal relaxation and increased filling   pressure (grade 2 diastolic dysfunction). - Aortic valve: Mildly to moderately calcified annulus. Mildly   thickened, mildly calcified leaflets. There was mild stenosis.   There was trivial  regurgitation. Valve area (VTI): 1.31 cm^2.   Valve area (Vmax): 1.28 cm^2. Valve area (Vmean): 1.26 cm^2. - Mitral valve: There was mild regurgitation. - Left atrium: The atrium was moderately dilated. - Pericardium, extracardiac: A trivial pericardial effusion was   identified. There was a left pleural effusion.   Patient Profile     76 y.o. female with past medical history of moderate aortic stenosis, hypertension, renal insufficiency, diabetes mellitus, chronic foot ulcer, hyperlipidemia for evaluation of chest pain and acute on chronic diastolic congestive heart failure. Troponin minimally elevated. Echocardiogram shows ejection fraction 40-45% with akinesis of the mid anteroseptal myocardium (new), grade 2 diastolic dysfunction, mild aortic stenosis with mean gradient 13 mmHg, mild mitral regurgitation and moderate left atrial enlargement.   Assessment & Plan    CHEST PAIN:  Not thought to be anginal.  No plan for cardiac work up.  No recurrent chest pain.   Continue current meds  ACUTE COMBINED SYSTOLIC AND DIASTOLIC HF:  Down 5.9 liters since admit.  Weight is down about 13 lbs.  OK to continue current dose of diuretic.  Switch to PO in the AM.    ACUTE ON CHRONIC CKD STAGE III:  Creat is  Up slightly today.  Follow creatinine.   AORTIC STENOSIS MILD:  This has been mild by echo and clinically.  No change in therapy planned.  No further imaging.    Signed, Rollene Rotunda, MD  08/04/2016, 9:09 AM

## 2016-08-04 NOTE — Progress Notes (Signed)
Family Medicine Teaching Service Daily Progress Note Intern Pager: [phone]  Patient name: Emily Miranda Medical record number: [id number] Date of birth: 1940/06/06 Age: 76 y.o. Gender: female  Primary Care Provider: Angelina Sheriff., MD Consultants: cardiology  Code Status: FULL  Pt Overview and Major Events to Date:  3/19 admitted with crushing chest pain, heparin gtt started, cards consulted   Assessment and Plan: Emily Miranda is a 76 y.o. female presenting with shortness of breath and chest pain. PMH is significant for T2DM, hx of pancreatitis, HTN, Charcot foot due to diabetes, stress urinary incontinence and hx of recurrent UTIs.    CHF exacerbation, improving :   UOP 2400. Weight down 13 lbs.  No difficulty with overnight urination last night.  -Cardiology following, appreciate recs: continue diuresis, unlikely anginal, no nuclear stress test per Hochrein.   -IV Lasix 120 mg, continue metolazone  -daily weights, I's and O's   -consider foley if persistent bothersome nocturnal diuresis  Chest pain, ACS ruled out . Patient also describes pain as "crushing".  Follows outpatient with [facility].   -home norco -appreciate cards recs: no nuclear stress test needed at this time.   Multifocal pneumonia: found on CT chest. No longer requiring O2. Treating as CAP.  Has remained afebrile overnight.  -continue amox and azithro day 4 of 7 -monitor fevers  AKI, resolving: Cr 1.83 in the ED >>1.38>1.43>1.63.  Baseline ~1.1 per outside records review. FeUrea 16% suggesting prerenal cause likely secondary to decreased perfusion 2/2 CHF exacerbation. - Holding home Lisinopril and Chlorthalidone - Holding home Celebrex - Avoid nephrotoxic agents - continue to follow BMP  Weight loss: patient notes unintentional 40 pound weight loss. Discussed with PCP who reports last planned colonoscopy was 2 years ago with Dr. Odie Sera and Esmond Plants is her GYN who would be managing  mammograms. LFTs WNL. ESR elevated at 108. CRP elevated at 3.2. TSH wNL. HIV NR. RF <10.0. Hep panel negative. Peripheral smear thrombocytosis with anisopoikilocytosis. ANA neg. CXR with multifocal pneumonia. PPD negative. -continue workup as an outpatient  HTN. BPs normotensive overnight with one elevated this AM to 144/71.    At home on Lisinopril 30 mg, Chlorthalidone 12.5 mg and Norvasc 5 mg.  -Will hold Lisinopril and Chlorthalidone in setting of AKI.  -Continue home Norvasc 5 mg daily   T2DM 2/2 pancreatitis.  Blood sugar in ED elevated to 400.  At home on Tradjenta 5 mg daily and glimepiride 4 mg daily.   Reports good compliance. Hgba1c 12.5%.  CBGs 207, 183, 267.   -lantus 12U   -sSSI -CBGs 4x daily   Microcytic anemia.  Hgb 8.5 from 11.9 in Feb 2011.  MCV low at 79. Outpatient, she was started on IV infusion . FOBT negative in ED. Iron studies suggestive of iron deficiency anemia. Patient reports she saw a hematologist as an outpatient who told her that her anemia was due to her kidneys and she takes a monthly expensive shot for this (?aranesp).  -stable on CBC today - 8.5.   Charcot foot due to diabetes mellitus with Diabetic ulcer.  Patient with history of ulcer on R foot for past 1.5 years.  She is followed outpatient by Podiatry. Reports several weeks ago there was concern for DVT and dopplers were performed and negative.  Dressing over R foot ulcer is c/d/I. Of note, RLE was more swollen, warm and erythematous than LLE, concerning for DVT on presentation. -Requested RN to apply ace to RLE and compression stocking  to LLE to help with leg swelling  -wound care consult   Constipation: Takes Miralax at home and it helps her.   -Continue Miralax daily prn   FEN/GI: Carb-modified diet, SLIV  Prophylaxis: SCDs  Disposition: continued inpatient management of fluid overload and antibiotics for CAP  Subjective:  Patient has not required O2.  States she has been peeing a lot and  diuresis has helped her shortness of breath.  Still with some LE swelling.  She is agreeable to ace wrap and compression stocking to help with this.  Otherwise no complaints this AM.   Objective: Temp:  [97.6 F (36.4 C)-98.1 F (36.7 C)] 97.6 F (36.4 C) (03/25 0635) Pulse Rate:  [88-96] 95 (03/25 0904) Resp:  [18-20] 18 (03/25 0635) BP: (116-144)/(63-75) 144/71 (03/25 0904) SpO2:  [90 %-99 %] 99 % (03/25 0904) Weight:  [141 lb 8 oz (64.2 kg)] 141 lb 8 oz (64.2 kg) (03/25 1751) Physical Exam: General: very pleasant elderly female sitting in bed, in NAD Cardiovascular: RRR, 2/6 systolic murmur at RUSB Respiratory: mild crackles in bilateral bases, comfortable work of breathing on RA Abdomen: Soft, NT, ND, +BS Extremities: 1+ pitting edema bilaterally to knee, R midfoot bandaged.  Psych: normal affect   Laboratory:  Recent Labs Lab 08/02/16 0232 08/03/16 0421 08/04/16 0450  WBC 7.9 7.5 7.6  HGB 10.2* 9.1* 8.5*  HCT 33.6* 30.3* 28.3*  PLT 519* 452* 399    Recent Labs Lab 07/30/16 1526  08/02/16 0232 08/03/16 0421 08/04/16 0450  NA  --   < > 132* 137 133*  K  --   < > 4.6 3.6 3.7  CL  --   < > 95* 97* 93*  CO2  --   < > _0 BUN  --   < > 43* 42* 46*  CREATININE  --   < > 1.38* 1.43* 1.63*  CALCIUM  --   < > 9.0 9.0 8.6*  PROT 7.4  --   --   --   --   BILITOT 0.2*  --   --   --   --   ALKPHOS 82  --   --   --   --   ALT 7*  --   --   --   --   AST 17  --   --   --   --   GLUCOSE  --   < > 179* 80 332*  < > = values in this interval not displayed. Trop 0.53>0.38>0.34>0.3  Imaging/Diagnostic Tests: No results found.  Lovenia Kim, MD 08/04/2016, 10:29 AM PGY-1, [place] Intern pager: [phone], text pages welcome

## 2016-08-04 NOTE — Progress Notes (Signed)
Patient stable during 7 a to 7 p shift, VSS, remains off oxygen with saturations in the 90's on room air.  Continues to diurese.  Family at bedside, patient hoping to discharge 08/05/16.

## 2016-08-04 NOTE — Progress Notes (Signed)
PHARMACY NOTE:  ANTIMICROBIAL RENAL DOSAGE ADJUSTMENT  Current antimicrobial regimen includes a mismatch between antimicrobial dosage and estimated renal function.  As per policy approved by the Pharmacy & Therapeutics and Medical Executive Committees, the antimicrobial dosage will be adjusted accordingly.  Current antimicrobial dosage:  Amoxicillin 500mg  Q8H  Indication: PNA   Estimated Creatinine Clearance: 28.3 mL/min (A) (by C-G formula based on SCr of 1.63 mg/dL (H)).    Antimicrobial dosage has been changed to:  Amoxicillin 500mg  Q12H  Thank you for allowing pharmacy to be a part of this patient's care.  Gwyndolyn Kaufman Bernette Redbird), PharmD  PGY1 Pharmacy Resident Pager: [phone] 08/04/2016 11:02 AM

## 2016-08-05 LAB — BASIC METABOLIC PANEL
Anion gap: 11 (ref 5–15)
BUN: 43 mg/dL — ABNORMAL HIGH (ref 6–20)
CO2: 30 mmol/L (ref 22–32)
Calcium: 8.9 mg/dL (ref 8.9–10.3)
Chloride: 96 mmol/L — ABNORMAL LOW (ref 101–111)
Creatinine, Ser: 1.52 mg/dL — ABNORMAL HIGH (ref 0.44–1.00)
GFR calc Af Amer: 38 mL/min — ABNORMAL LOW (ref >60)
GFR calc non Af Amer: 33 mL/min — ABNORMAL LOW (ref >60)
Glucose, Bld: 170 mg/dL — ABNORMAL HIGH (ref 65–99)
Potassium: 3.5 mmol/L (ref 3.5–5.1)
Sodium: 137 mmol/L (ref 135–145)

## 2016-08-05 LAB — CBC
HCT: 29.3 % — ABNORMAL LOW (ref 36.0–46.0)
Hemoglobin: 8.7 g/dL — ABNORMAL LOW (ref 12.0–15.0)
MCH: 24.2 pg — ABNORMAL LOW (ref 26.0–34.0)
MCHC: 29.7 g/dL — ABNORMAL LOW (ref 30.0–36.0)
MCV: 81.6 fL (ref 78.0–100.0)
Platelets: 387 10*3/uL (ref 150–400)
RBC: 3.59 MIL/uL — ABNORMAL LOW (ref 3.87–5.11)
RDW: 19 % — ABNORMAL HIGH (ref 11.5–15.5)
WBC: 7.1 10*3/uL (ref 4.0–10.5)

## 2016-08-05 LAB — GLUCOSE, CAPILLARY
Glucose-Capillary: 156 mg/dL — ABNORMAL HIGH (ref 65–99)
Glucose-Capillary: 256 mg/dL — ABNORMAL HIGH (ref 65–99)

## 2016-08-05 MED ORDER — FUROSEMIDE 80 MG PO TABS: 80.0000 mg | ORAL_TABLET | Freq: Two times a day (BID) | ORAL | 0 refills | Status: DC

## 2016-08-05 MED ORDER — ASPIRIN 81 MG PO TBEC: 81.0000 mg | DELAYED_RELEASE_TABLET | Freq: Every day | ORAL | 0 refills | Status: DC

## 2016-08-05 MED ORDER — INSULIN STARTER KIT- PEN NEEDLES (ENGLISH)
1.0000 | Freq: Once | Status: AC
  Administered 2016-08-05: 1
  Filled 2016-08-05: qty 1

## 2016-08-05 MED ORDER — INSULIN STARTER KIT- PEN NEEDLES (ENGLISH): 1.0000 | Freq: Once | 0 refills | Status: AC

## 2016-08-05 MED ORDER — FUROSEMIDE 80 MG PO TABS: 80.0000 mg | ORAL_TABLET | Freq: Two times a day (BID) | ORAL | Status: DC

## 2016-08-05 MED ORDER — ACCU-CHEK AVIVA PLUS W/DEVICE KIT: PACK | 0 refills | Status: DC

## 2016-08-05 MED ORDER — AZITHROMYCIN 500 MG PO TABS: 500.0000 mg | ORAL_TABLET | Freq: Every day | ORAL | 0 refills | Status: AC

## 2016-08-05 MED ORDER — INSULIN GLARGINE 100 UNIT/ML ~~LOC~~ SOLN: 12.0000 [IU] | Freq: Every day | SUBCUTANEOUS | 11 refills | Status: DC

## 2016-08-05 MED ORDER — ATORVASTATIN CALCIUM 40 MG PO TABS: 40.0000 mg | ORAL_TABLET | Freq: Every day | ORAL | 3 refills | Status: DC

## 2016-08-05 MED ORDER — METOLAZONE 2.5 MG PO TABS: 2.5000 mg | ORAL_TABLET | ORAL | 0 refills | Status: DC

## 2016-08-05 MED ORDER — AMOXICILLIN 500 MG PO CAPS: 500.0000 mg | ORAL_CAPSULE | Freq: Two times a day (BID) | ORAL | 0 refills | Status: AC

## 2016-08-05 NOTE — Progress Notes (Signed)
Inpatient Diabetes Program Recommendations  AACE/ADA: New Consensus Statement on Inpatient Glycemic Control (2015)  Target Ranges:  Prepandial:   less than 140 mg/dL      Peak postprandial:   less than 180 mg/dL (1-2 hours)      Critically ill patients:  140 - 180 mg/dL   Lab Results  Component Value Date   GLUCAP 256 (H) 08/05/2016   HGBA1C 12.5 (H) 07/29/2016    RN: Please continue insulin teaching with this patient in case decision made by MD to send pt home on insulin. A1c elevated. Please allow pt to practice drawing up and giving injections as much as possible and document.  Reviewed insulin pen including 2 unit flush per each time with new pen needle, selection of site, with return demonstration. Patient was able to demonstrate proper technique of insulin pen administration. Requested pt. To take meter or log of CBGs to MD appts for review. Will need prescription for pen needles [phone].  Thank you, [name]. Jakia Kennebrew, RN, MSN, CDE Inpatient Glycemic Control Team Team Pager [phone] (8am-5pm) 08/05/2016 3:40 PM

## 2016-08-05 NOTE — Progress Notes (Signed)
Family Medicine Teaching Service Daily Progress Note Intern Pager: [phone]  Patient name: Emily Miranda Medical record number: [id number] Date of birth: 1941-04-24 Age: 76 y.o. Gender: female  Primary Care Provider: Noni Saupe., MD Consultants: cardiology  Code Status: FULL  Pt Overview and Major Events to Date:  3/19 admitted with crushing chest pain, heparin gtt started, cards consulted   Assessment and Plan: Emily Miranda is a 76 y.o. female presenting with shortness of breath and chest pain. PMH is significant for T2DM, hx of pancreatitis, HTN, Charcot foot due to diabetes, stress urinary incontinence and hx of recurrent UTIs.    CHF exacerbation, improving :   UOP 2100. Weight down 16 lbs.  No difficulty with overnight urination last night.  -Cardiology following, appreciate recs: continue diuresis, unlikely anginal, no nuclear stress test per Hochrein.   -transition to PO lasix today -daily weights, I's and O's   -consider foley if persistent bothersome nocturnal diuresis  Chest pain, ACS ruled out . Patient also describes pain as "crushing".  Follows outpatient with [facility].   -home norco -appreciate cards recs: no nuclear stress test needed at this time.   Multifocal pneumonia: found on CT chest. No longer requiring O2. Treating as CAP.  Has remained afebrile overnight.  -continue amox and azithro day 5 of 7 -monitor fevers  AKI, resolving: Cr 1.83 in the ED >>1.38>1.52.  Baseline ~1.1 per outside records review. FeUrea 16% suggesting prerenal cause likely secondary to decreased perfusion 2/2 CHF exacerbation. - Holding home Lisinopril and Chlorthalidone - Holding home Celebrex - Avoid nephrotoxic agents - continue to follow BMP  Weight loss: patient notes unintentional 40 pound weight loss. Discussed with PCP who reports last planned colonoscopy was 2 years ago with Dr. Braulio Conte and Nestor Ramp is her GYN who would be managing mammograms.  LFTs WNL. ESR elevated at 108. CRP elevated at 3.2. TSH wNL. HIV NR. RF <10.0. Hep panel negative. Peripheral smear thrombocytosis with anisopoikilocytosis. ANA neg. CXR with multifocal pneumonia. PPD negative. -continue workup as an outpatient  HTN. BPs normotensive overnight with one elevated this AM to 144/71.    At home on Lisinopril 30 mg, Chlorthalidone 12.5 mg and Norvasc 5 mg.  -Will hold Lisinopril and Chlorthalidone in setting of AKI.  -Continue home Norvasc 5 mg daily   T2DM 2/2 pancreatitis.  Blood sugar in ED elevated to 400.  At home on Tradjenta 5 mg daily and glimepiride 4 mg daily.   Reports good compliance. Hgba1c 12.5%.  CBGs 207, 183, 267.   -lantus 12U   -sSSI -CBGs 4x daily   Microcytic anemia.  Hgb 8.7 from 11.9 in Feb 2011.  MCV low at 79. Outpatient, she was started on IV infusion . FOBT negative in ED. Iron studies suggestive of iron deficiency anemia. Patient reports she saw a hematologist as an outpatient who told her that her anemia was due to her kidneys and she takes a monthly expensive shot for this (?aranesp).  -continue to monitor CBCs  Charcot foot due to diabetes mellitus with Diabetic ulcer.  Patient with history of ulcer on R foot for past 1.5 years.  She is followed outpatient by Podiatry. Reports several weeks ago there was concern for DVT and dopplers were performed and negative.  Dressing over R foot ulcer is c/d/I. Of note, RLE was more swollen, warm and erythematous than LLE, concerning for DVT on presentation. -Requested RN to apply ace to RLE and compression stocking to LLE to help with  leg swelling  -wound care consult   Constipation: Takes Miralax at home and it helps her.   -Continue Miralax daily prn   FEN/GI: Carb-modified diet, SLIV  Prophylaxis: SCDs  Disposition: continued inpatient management of fluid overload and antibiotics for CAP  Subjective:  Patient told by cards she could go home today, she has made plans for this. Feels  no CP or respiratory concerns, will discuss dispo plans with team.   Objective: Temp:  [97.9 F (36.6 C)-98 F (36.7 C)] 97.9 F (36.6 C) (03/26 0505) Pulse Rate:  [85-95] 87 (03/26 0505) Resp:  [17-18] 17 (03/26 0505) BP: (111-144)/(54-84) 136/84 (03/26 0505) SpO2:  [92 %-99 %] 96 % (03/26 0505) Weight:  [138 lb 8 oz (62.8 kg)] 138 lb 8 oz (62.8 kg) (03/26 0505) Physical Exam: General: very pleasant elderly female sitting in bed, in NAD Cardiovascular: RRR, 2/6 systolic murmur at RUSB Respiratory: mild crackles in bilateral bases, comfortable work of breathing on RA Abdomen: Soft, NT, ND, +BS Extremities: 1+ pitting edema bilaterally to knee, R midfoot bandaged, compression stocking on RLE. Psych: normal affect   Laboratory:  Recent Labs Lab 08/03/16 0421 08/04/16 0450 08/05/16 0513  WBC 7.5 7.6 7.1  HGB 9.1* 8.5* 8.7*  HCT 30.3* 28.3* 29.3*  PLT 452* 399 387    Recent Labs Lab 07/30/16 1526  08/03/16 0421 08/04/16 0450 08/05/16 0513  NA  --   < > 137 133* 137  K  --   < > 3.6 3.7 3.5  CL  --   < > 97* 93* 96*  CO2  --   < > '26 28 30  '$ BUN  --   < > 42* 46* 43*  CREATININE  --   < > 1.43* 1.63* 1.52*  CALCIUM  --   < > 9.0 8.6* 8.9  PROT 7.4  --   --   --   --   BILITOT 0.2*  --   --   --   --   ALKPHOS 82  --   --   --   --   ALT 7*  --   --   --   --   AST 17  --   --   --   --   GLUCOSE  --   < > 80 332* 170*  < > = values in this interval not displayed. Trop 0.53>0.38>0.34>0.3  Imaging/Diagnostic Tests: No results found.  Sela Hilding, MD 08/05/2016, 7:22 AM PGY-1, [place] Intern pager: [phone], text pages welcome

## 2016-08-05 NOTE — Progress Notes (Signed)
Orders received for pt discharge.  Discharge summary printed and reviewed with pt.  Explained medication regimen, and pt had no further questions at this time.  IV removed and site remains clean, dry, intact.  Telemetry removed.   Gave pt the insulin starter kit prior to discharge.  Attempted to re-educate pt on giving insulin, yet she refused.  She stated that "she would do it when she got home." Pt in stable condition and awaiting transport.

## 2016-08-05 NOTE — Progress Notes (Signed)
Progress Note  Patient Name: Emily Miranda Date of Encounter: 08/05/2016  Primary Cardiologist: Dr. Antoine Poche  Subjective   Feeling back to normal and eager to go home.  Inpatient Medications    Scheduled Meds: . amLODipine  5 mg Oral Daily  . amoxicillin  500 mg Oral Q12H  . aspirin EC  81 mg Oral Daily  . atorvastatin  40 mg Oral q1800  . azithromycin  500 mg Oral Daily  . heparin subcutaneous  5,000 Units Subcutaneous Q8H  . insulin aspart  0-9 Units Subcutaneous TID WC  . insulin glargine  12 Units Subcutaneous QHS  . metolazone  2.5 mg Oral Q24H  . sodium chloride flush  3 mL Intravenous Q12H   Continuous Infusions:  PRN Meds: sodium chloride, HYDROcodone-acetaminophen, MUSCLE RUB, nitroGLYCERIN, polyethylene glycol, sodium chloride flush, traMADol   Vital Signs    Vitals:   08/04/16 0904 08/04/16 1230 08/04/16 2038 08/05/16 0505  BP: (!) 144/71 (!) 111/54 139/68 136/84  Pulse: 95 85 91 87  Resp:  18 18 17   Temp:  98 F (36.7 C) 97.9 F (36.6 C) 97.9 F (36.6 C)  TempSrc:  Oral Oral Oral  SpO2: 99% 92% 98% 96%  Weight:    138 lb 8 oz (62.8 kg)  Height:        Intake/Output Summary (Last 24 hours) at 08/05/16 1048 Last data filed at 08/05/16 0852  Gross per 24 hour  Intake              775 ml  Output             2101 ml  Net            -1326 ml   Filed Weights   08/03/16 0619 08/04/16 0635 08/05/16 0505  Weight: 142 lb 14.4 oz (64.8 kg) 141 lb 8 oz (64.2 kg) 138 lb 8 oz (62.8 kg)     Physical Exam  Comfortable General: Well developed, well nourished, female appearing in no acute distress. Head: Normocephalic, atraumatic.  Neck: Supple without bruits, JVP normal. Lungs:  Resp regular and unlabored, CTA. Heart: RRR, S1, S2, no S3, S4, or murmur; no rub. Abdomen: Soft, non-tender, non-distended with normoactive bowel sounds. No hepatomegaly. No rebound/guarding. No obvious abdominal masses. Extremities: No clubbing, cyanosis, some edema R  foot, bandaged. Neuro: Alert and oriented X 3. Moves all extremities spontaneously. Psych: Normal affect.  Labs    Chemistry Recent Labs Lab 07/30/16 1526  08/03/16 0421 08/04/16 0450 08/05/16 0513  NA  --   < > 137 133* 137  K  --   < > 3.6 3.7 3.5  CL  --   < > 97* 93* 96*  CO2  --   < > 26 28 30   GLUCOSE  --   < > 80 332* 170*  BUN  --   < > 42* 46* 43*  CREATININE  --   < > 1.43* 1.63* 1.52*  CALCIUM  --   < > 9.0 8.6* 8.9  PROT 7.4  --   --   --   --   ALBUMIN 2.4*  --   --   --   --   AST 17  --   --   --   --   ALT 7*  --   --   --   --   ALKPHOS 82  --   --   --   --   BILITOT 0.2*  --   --   --   --  GFRNONAA  --   < > 35* 30* 33*  GFRAA  --   < > 41* 35* 38*  ANIONGAP  --   < > 14 12 11   < > = values in this interval not displayed.   Hematology Recent Labs Lab 08/03/16 0421 08/04/16 0450 08/05/16 0513  WBC 7.5 7.6 7.1  RBC 3.78* 3.48* 3.59*  HGB 9.1* 8.5* 8.7*  HCT 30.3* 28.3* 29.3*  MCV 80.2 81.3 81.6  MCH 24.1* 24.4* 24.2*  MCHC 30.0 30.0 29.7*  RDW 18.6* 19.0* 19.0*  PLT 452* 399 387    Cardiac Enzymes Recent Labs Lab 07/30/16 0137 07/30/16 1120 07/30/16 1526 07/30/16 2051  TROPONINI 0.30* 0.26* 0.23* 0.19*    Recent Labs Lab 07/29/16 1110 07/29/16 1544 07/29/16 1751  TROPIPOC 0.53* 0.67* 0.74*     BNP Recent Labs Lab 07/29/16 1110  BNP 2,605.1*     DDimer  Recent Labs Lab 07/29/16 1124  DDIMER 8.35*     Radiology    No results found.   Telemetry    NSR - Personally Reviewed  ECG    SR with PVCs, 1st deg AVB, PRWP/ol anterior MI - Personally Reviewed   Cardiac Studies   ECHO (08/01/16) Study Conclusions  - Left ventricle: The cavity size was normal. Wall thickness was normal. Systolic function was mildly to moderately reduced. The estimated ejection fraction was in the range of 40% to 45%. Akinesis of the midanteroseptal myocardium. Features are consistent with a pseudonormal left ventricular  filling pattern, with concomitant abnormal relaxation and increased filling pressure (grade 2 diastolic dysfunction). - Aortic valve: Mildly to moderately calcified annulus. Mildly thickened, mildly calcified leaflets. There was mild stenosis. There was trivial regurgitation. Valve area (VTI): 1.31 cm^2. Valve area (Vmax): 1.28 cm^2. Valve area (Vmean): 1.26 cm^2. - Mitral valve: There was mild regurgitation. - Left atrium: The atrium was moderately dilated. - Pericardium, extracardiac: A trivial pericardial effusion was identified. There was a left pleural effusion.  Patient Profile     76 y.o. female with past medical history of moderate aortic stenosis, hypertension, renal insufficiency, diabetes mellitus, chronic foot ulcer, hyperlipidemia for evaluation of chest pain and acute on chronic diastolic congestive heart failure. Troponin minimally elevated. Echocardiogram shows ejection fraction 40-45% with akinesis of the mid anteroseptal myocardium (new), grade 2 diastolic dysfunction, mild aortic stenosis with mean gradient 13 mmHg, mild mitral regurgitation and moderate left atrial enlargement.   Assessment & Plan    1. Chest pain - most likely MSK pain (increases with movement), persistent, mildly flattened cardiac markers and no ST changes on EKG. New wall motion abnormality on echo, but no invasive evaluation planned 2/ acute on CKD and risk of contrast nephropathy. No plan for cardiac workup at this time. May consider OP myoview after CHF has stabilized.    2. Acute combined systolic and diastolic heart failure - continues to diurese: net negative 7 L with 2.1L urine output yesterday - wt 138 lbs (154 lbs on admission), dry weight difficult to determine (was 160 lbs at last clinic visit on 06/04/15)   3. Acute on Chronic Kidney Injury stage III - sCr trending down 1.52 (1.63); baseline appears to be 1.1   4. Aortic stenosis - continue to follow, no intervention at  this time   Signed, Marcelino Duster , PA-C 10:48 AM 08/05/2016 Pager: [phone]  I have seen and examined the patient along with Marcelino Duster , PA.  I have reviewed the chart, notes and new  data.  I agree with PA's note.  Key new complaints: able to lie flat and walk the hallway without O2, no dyspnea Key examination changes: pedal edema resolved except some residual swelling around R foot ulcer (dressing on). Murmur of AS is early peaking. Key new findings / data: renal function actually a little better, although not at baseline after diuresis  PLAN: Discussed sodium restriction, daily weight monitoring, signs/symptoms of HF exacerbation, need to call for advice if weight up +3 lb/24h or +5 lb from today's weight. Will arrange f/u with Dr. Antoine Poche.  Thurmon Fair, MD, [facility] [phone] 08/05/2016, 1:06 PM

## 2016-08-06 NOTE — Progress Notes (Signed)
08/06/16 @ 1636:  Pt discharged yesterday, 08/05/16, and called back to [facility] stating that she does not have her insulin syringe.  She states that the insulin pen needle was with her in the insulin starter kit, but now she cannot find it. I contacted and relayed the same info to Dr. Rosalyn Gess with Family Medicine, and he states that he will call pt's pharmacy, [facility] in [place], [place], at [phone], to reorder. I have called back pt at [phone], to let her know.

## 2016-08-13 DIAGNOSIS — I509 Heart failure, unspecified: Secondary | ICD-10-CM | POA: Diagnosis not present

## 2016-08-13 DIAGNOSIS — N181 Chronic kidney disease, stage 1: Secondary | ICD-10-CM | POA: Diagnosis not present

## 2016-08-13 DIAGNOSIS — E1165 Type 2 diabetes mellitus with hyperglycemia: Secondary | ICD-10-CM | POA: Diagnosis not present

## 2016-08-13 DIAGNOSIS — Z6820 Body mass index (BMI) 20.0-20.9, adult: Secondary | ICD-10-CM | POA: Diagnosis not present

## 2016-08-13 DIAGNOSIS — I10 Essential (primary) hypertension: Secondary | ICD-10-CM | POA: Diagnosis not present

## 2016-08-13 DIAGNOSIS — D638 Anemia in other chronic diseases classified elsewhere: Secondary | ICD-10-CM | POA: Diagnosis not present

## 2016-08-15 ENCOUNTER — Other Ambulatory Visit: Payer: Self-pay

## 2016-08-15 ENCOUNTER — Telehealth: Payer: Self-pay | Admitting: Cardiology

## 2016-08-15 ENCOUNTER — Telehealth: Payer: Self-pay | Admitting: Sports Medicine

## 2016-08-15 NOTE — Telephone Encounter (Signed)
Spoke with dr redding, he is sending a copy of patient's lab work and office note. He wants to make sure the labs and medications are addressed prior to the weekend. Her sodium in down and her creatine is elevated. Dr redding feels the patients dieretics need to be adjusted. He request if we do not receive by the end of the day to call his office @ [phone] to get them faxed.

## 2016-08-15 NOTE — Telephone Encounter (Signed)
Called and ask dr reddings office to refax information.

## 2016-08-15 NOTE — Patient Outreach (Signed)
[facility]) Care Management  08/15/2016  LIZZIEANN GIOVANNI [birth year] [id number]     EMMI-HF RED ON EMMI ALERT Day # 8 Date: 08/14/16 Red Alert Reason: "New/worsening problems? Yes"  "Went to follow up appt? No"    Outreach attempt #1 to patient. Spoke with spouse who reported patient was still resting. ROI on file for spouse and call completed with spouse. Addressed and reviewed red alerts. Spouse reports that patient was not feeling well that day and she was more tired than normal and that's why she responded that way. Spouse has been encouraging patient to sleep and rest more to recover from recent hospital stay. He states that she has f/u appt with wound center on tomorrow. Patient had PCP f/u appt on 08/13/16. He denies any issues with meds and or transportation. Spouse states he is very supportive in assisting with patient's care. Spouse aware of s/s of worsening condition and when to seek medical attention. He denies patient having RN CM needs or concerns at this time. Advised that patient would continue to get EMMI-HF post discharge automated calls over the course of several weeks and will receive a call from a nurse if any of her responses are abnormal. He voiced understanding and was appreciative of call.    Plan: RN CM will notify [facility] administrative assistant of case status.     Antionette Fairy, RN,BSN,CCM [facility] Care Management Telephonic Care Management Coordinator Direct Phone: [phone] Toll Free: [phone] Fax: [phone]

## 2016-08-15 NOTE — Telephone Encounter (Signed)
Called to check on patient to make sure she is doing better after being in hospital. Patient reports she had PNA and fluid overload now on lasix and is doing better. Patient reports that her foot ulcer is almost healed and that she has an appointment to see me on tomorrow.  Patient denies any other issues. -Dr. Marylene Land

## 2016-08-16 ENCOUNTER — Ambulatory Visit (INDEPENDENT_AMBULATORY_CARE_PROVIDER_SITE_OTHER): Payer: Medicare Other | Admitting: Sports Medicine

## 2016-08-16 ENCOUNTER — Encounter: Payer: Self-pay | Admitting: Sports Medicine

## 2016-08-16 DIAGNOSIS — M14671 Charcot's joint, right ankle and foot: Secondary | ICD-10-CM

## 2016-08-16 DIAGNOSIS — E08621 Diabetes mellitus due to underlying condition with foot ulcer: Secondary | ICD-10-CM | POA: Diagnosis not present

## 2016-08-16 DIAGNOSIS — M79671 Pain in right foot: Secondary | ICD-10-CM

## 2016-08-16 DIAGNOSIS — L97411 Non-pressure chronic ulcer of right heel and midfoot limited to breakdown of skin: Secondary | ICD-10-CM | POA: Diagnosis not present

## 2016-08-16 DIAGNOSIS — E1142 Type 2 diabetes mellitus with diabetic polyneuropathy: Secondary | ICD-10-CM | POA: Diagnosis not present

## 2016-08-16 NOTE — Telephone Encounter (Signed)
Called PCP Dr. Marnee Guarneri office and requested labs + office note to be faxed.

## 2016-08-16 NOTE — Telephone Encounter (Signed)
Received office note and labs from PCP Patient seen 4/3 for hospital follow up  Sodium 131 Potassium 4.2 BUN 46 Creatinine 1.53  Reviewed with DOD, Dr. Herbie Baltimore, who did not recommend med changes. In comparison to last BMET done in hospital, labs are stable. He recommends follow up with either Dr. Antoine Poche or APP.   Attempted to contact patient. Patient unavailable. LM with female answering the phone to have patient call back.  Note/labs placed in MD mail box

## 2016-08-17 NOTE — Progress Notes (Signed)
Subjective: Emily Miranda is a 76 y.o. female patient seen today in office for POV #21 (DOS 01-08-16), S/P Right foot wound debridement and placement of stravix allograft and status post, office excision of ulceration 02/29/2016 with pathology revealing traumatized tissue with no bacterial pathogens. Patient states that the pain is better and that she is on lasix after being discharged from hospital with improvement in swelling, denies shortness of breath, nausea, vomiting, fever, or chills. No other issues noted.   Patient Active Problem List   Diagnosis Date Noted  . Loss of weight   . Acute combined systolic and diastolic congestive heart failure (HCC)   . Shortness of breath 07/29/2016  . Wound infection 11/13/2015  . Abdominal pain, RLQ (right lower quadrant) -chronic since 2009 06/02/2013  . Constipation, chronic 06/02/2013  . SUI (stress urinary incontinence, female) 06/02/2013  . Recurrent UTI (urinary tract infection) 06/02/2013  . HTN (hypertension)   . CHEST PAIN 11/27/2009  . OVERWEIGHT 11/28/2008  . ESSENTIAL HYPERTENSION, BENIGN 11/28/2008  . DM 11/25/2008  . AORTIC STENOSIS 11/25/2008  . PANCREATITIS, HX OF 11/25/2008    Current Outpatient Prescriptions on File Prior to Visit  Medication Sig Dispense Refill  . amLODipine (NORVASC) 5 MG tablet Take 5 mg by mouth daily.     . Ascorbic Acid (VITAMIN C) 1000 MG tablet Take 1,000 mg by mouth daily.    Marland Kitchen aspirin EC 81 MG EC tablet Take 1 tablet (81 mg total) by mouth daily. 30 tablet 0  . atorvastatin (LIPITOR) 40 MG tablet Take 1 tablet (40 mg total) by mouth daily at 6 PM. 30 tablet 3  . docusate sodium (COLACE) 100 MG capsule TAKE 1 TABLET BY MOUTH EVERY 8 HOURS, AS NEEDED FOR CONSTIPATION 30 capsule 0  . fluticasone (FLONASE) 50 MCG/ACT nasal spray Place 1 spray into both nostrils daily as needed for allergies or rhinitis (seasonal allergies).     . furosemide (LASIX) 80 MG tablet Take 1 tablet (80 mg total) by mouth  2 (two) times daily. 60 tablet 0  . insulin glargine (LANTUS) 100 UNIT/ML injection Inject 0.12 mLs (12 Units total) into the skin at bedtime. 10 mL 11  . metolazone (ZAROXOLYN) 2.5 MG tablet Take 1 tablet (2.5 mg total) by mouth daily. 30 tablet 0  . omeprazole (PRILOSEC) 20 MG capsule Take 20 mg by mouth daily.     . ONE TOUCH ULTRA TEST test strip 1 each by Other route once a week.     Bertram Gala Glycol-Propyl Glycol (SYSTANE OP) Place 1 drop into both eyes daily as needed (dry eyes).    . polyethylene glycol (MIRALAX / GLYCOLAX) packet Take 17 g by mouth daily as needed (constipation). Mix in 8 oz liquid and drink    . traMADol (ULTRAM) 50 MG tablet Take 1 tablet (50 mg total) by mouth every 8 (eight) hours as needed. (Patient taking differently: Take 50 mg by mouth 2 (two) times daily as needed (pain). ) 30 tablet 0   No current facility-administered medications on file prior to visit.     Allergies  Allergen Reactions  . Levofloxacin Nausea And Vomiting    Other reaction(s): Confusion  . Gabapentin Other (See Comments)    Disoriented, no strength in legs    Objective: There were no vitals filed for this visit.  General: No acute distress, AAOx3  Right foot: Circular Plantar medial ulceration with minimal budding hyper-granular base measures post excision 1x0.5x 0.2cm filling in with minimal clear drainage,  no erythema, no warmth, no active drainage, no acute signs of infection noted, There is decreased focal pain to right calf with decreased swelling and decreased pain to dorsal forefoot on right, Capillary fill time <3 seconds in all digits, gross sensation present via light touch to right foot. No pain or crepitation with range of motion right foot.  No pain with calf compression.   Assessment and Plan:  Problem List Items Addressed This Visit    None    Visit Diagnoses    Diabetic ulcer of right midfoot associated with diabetes mellitus due to underlying condition, limited to  breakdown of skin (HCC)    -  Primary   Diabetic polyneuropathy associated with type 2 diabetes mellitus (HCC)       Charcot ankle, right       Right foot pain         -Patient seen and evaluated -Re-Discussed swelling and changes to foot, likely secondary to Charcot deformity -Continue with Tamadol as needed for pain -Continue with topical pain rub of lidocaine to right as needed for pain  -Continue with lasix as Rx by PCP -Re-Discussed treatment options and continued care for chronic Slow healing wound. Patient opt to continue to hold off on surgery at this time and I agree since wound is improving -Cleansed right foot ulceration and applied Silver nitrate fibrocall pad to hyper- granular areas at right foot ulcer secured with 4x4 guaze and paper tape; patient to change dressing daily with dry guaze and tape  -Continue self PT at home for weakness and nonweightbearing on right lower extremity. May use rolling walker and transport wheelchair -Return in 3 weeks for continued ulcer care.   Asencion Islam, DPM

## 2016-08-20 MED ORDER — TRAMADOL HCL 50 MG PO TABS: 50.0000 mg | ORAL_TABLET | Freq: Three times a day (TID) | ORAL | 0 refills | Status: DC | PRN

## 2016-08-20 NOTE — Telephone Encounter (Signed)
Called patient w/MD recommendations  She voiced understanding She agrees to appt with MD or APP Scheduled for 4/17 with [place], [place] @ 2pm

## 2016-08-22 MED ORDER — DOCUSATE SODIUM 100 MG PO CAPS: 100.0000 mg | ORAL_CAPSULE | Freq: Two times a day (BID) | ORAL | 1 refills | Status: DC

## 2016-08-26 DIAGNOSIS — L97412 Non-pressure chronic ulcer of right heel and midfoot with fat layer exposed: Secondary | ICD-10-CM | POA: Diagnosis not present

## 2016-08-26 DIAGNOSIS — E11621 Type 2 diabetes mellitus with foot ulcer: Secondary | ICD-10-CM | POA: Diagnosis not present

## 2016-08-26 DIAGNOSIS — M14671 Charcot's joint, right ankle and foot: Secondary | ICD-10-CM | POA: Diagnosis not present

## 2016-08-26 DIAGNOSIS — M79671 Pain in right foot: Secondary | ICD-10-CM | POA: Diagnosis not present

## 2016-08-27 ENCOUNTER — Other Ambulatory Visit: Payer: Self-pay | Admitting: Cardiology

## 2016-08-27 ENCOUNTER — Ambulatory Visit (INDEPENDENT_AMBULATORY_CARE_PROVIDER_SITE_OTHER): Payer: Medicare Other | Admitting: Cardiology

## 2016-08-27 ENCOUNTER — Encounter: Payer: Self-pay | Admitting: Cardiology

## 2016-08-27 VITALS — BP 128/70 | HR 68 | Ht 66.0 in | Wt 126.2 lb

## 2016-08-27 DIAGNOSIS — E11621 Type 2 diabetes mellitus with foot ulcer: Secondary | ICD-10-CM | POA: Insufficient documentation

## 2016-08-27 DIAGNOSIS — E08621 Diabetes mellitus due to underlying condition with foot ulcer: Secondary | ICD-10-CM

## 2016-08-27 DIAGNOSIS — L97419 Non-pressure chronic ulcer of right heel and midfoot with unspecified severity: Secondary | ICD-10-CM

## 2016-08-27 DIAGNOSIS — I10 Essential (primary) hypertension: Secondary | ICD-10-CM | POA: Diagnosis not present

## 2016-08-27 DIAGNOSIS — L97519 Non-pressure chronic ulcer of other part of right foot with unspecified severity: Secondary | ICD-10-CM | POA: Diagnosis not present

## 2016-08-27 DIAGNOSIS — N183 Chronic kidney disease, stage 3 unspecified: Secondary | ICD-10-CM | POA: Insufficient documentation

## 2016-08-27 DIAGNOSIS — I5041 Acute combined systolic (congestive) and diastolic (congestive) heart failure: Secondary | ICD-10-CM

## 2016-08-27 LAB — BASIC METABOLIC PANEL
BUN: 77 mg/dL — ABNORMAL HIGH (ref 7–25)
CO2: 29 mmol/L (ref 20–31)
Calcium: 10.1 mg/dL (ref 8.6–10.4)
Chloride: 93 mmol/L — ABNORMAL LOW (ref 98–110)
Creat: 2.03 mg/dL — ABNORMAL HIGH (ref 0.60–0.93)
Glucose, Bld: 292 mg/dL — ABNORMAL HIGH (ref 65–99)
Potassium: 4.5 mmol/L (ref 3.5–5.3)
Sodium: 134 mmol/L — ABNORMAL LOW (ref 135–146)

## 2016-08-27 MED ORDER — FUROSEMIDE 40 MG PO TABS: 40.0000 mg | ORAL_TABLET | Freq: Every day | ORAL | 5 refills | Status: DC

## 2016-08-27 NOTE — Assessment & Plan Note (Signed)
SCr 1.52 at discharge

## 2016-08-27 NOTE — Assessment & Plan Note (Signed)
Mild AS, EF 40-45% by echo March 2018

## 2016-08-27 NOTE — Progress Notes (Signed)
08/27/2016 Emily Miranda   07/19/1940  [id number]  Primary Physician REDDING Valrie Hart., MD Primary Cardiologist: Dr Antoine Poche  HPI:  76 y/o from [place] followed by Dr Antoine Poche with a history of moderate AS, last echo Jan 2016. Her peak gradient was 45, mean 24 with an EF of 55% and grade 2DD. No history of CAD or MI. Other medical problems include HTN, chronic UTI, renal insufficiency, HLD, and DM with neuropathy, nephropathy and Charcot Rt foot with chronic foot ulcer s/p multiple procedures. Her podiatrist is Dr Marylene Land. Her last procedure was Oct 2017.  She presented to the ED 07/29/16 with chest pain and SOB. He chest pain was felt to be atypical. Her Troponin was elevated and she was felt to have acute on chronic combined CHF. Her admission wgt was 136 lbs. Her Troponin was elevated-0.5- but no ischemic work up was done secondary to acute on chronic renal insufficiency. She was diuresed and her renal function improved. An echo showed mild AS, grade 2 DD, and an EF of 40-45%. Her discharge wgt was 138 lbs. She was discharged on Lasix 80 mg BID and Zaroxolyn 2.5 mg daily.   She presents to the office today for follow up. She is in a wheelchair secondary to her Rt foot. She tells me this is starting to close. She was scheduled to [facility] dopplers but she ended up in the hospital. Her breathing is improved but she feels "wiped out".  She still has occasional "sharp chest pain".    Current Outpatient Prescriptions  Medication Sig Dispense Refill  . amLODipine (NORVASC) 5 MG tablet Take 5 mg by mouth daily.     . Ascorbic Acid (VITAMIN C) 1000 MG tablet Take 1,000 mg by mouth daily.    Marland Kitchen aspirin EC 81 MG EC tablet Take 1 tablet (81 mg total) by mouth daily. 30 tablet 0  . atorvastatin (LIPITOR) 40 MG tablet Take 1 tablet (40 mg total) by mouth daily at 6 PM. 30 tablet 3  . docusate sodium (COLACE) 100 MG capsule Take 1 capsule (100 mg total) by mouth 2 (two) times daily. 10 capsule 1  .  fluticasone (FLONASE) 50 MCG/ACT nasal spray Place 1 spray into both nostrils daily as needed for allergies or rhinitis (seasonal allergies).     . furosemide (LASIX) 40 MG tablet Take 1 tablet (40 mg total) by mouth daily. 30 tablet 5  . gabapentin (NEURONTIN) 300 MG capsule     . glimepiride (AMARYL) 2 MG tablet     . insulin glargine (LANTUS) 100 UNIT/ML injection Inject 0.12 mLs (12 Units total) into the skin at bedtime. 10 mL 11  . omeprazole (PRILOSEC) 20 MG capsule Take 20 mg by mouth daily.     . ONE TOUCH ULTRA TEST test strip 1 each by Other route once a week.     Bertram Gala Glycol-Propyl Glycol (SYSTANE OP) Place 1 drop into both eyes daily as needed (dry eyes).    . polyethylene glycol (MIRALAX / GLYCOLAX) packet Take 17 g by mouth daily as needed (constipation). Mix in 8 oz liquid and drink    . traMADol (ULTRAM) 50 MG tablet Take 1 tablet (50 mg total) by mouth every 8 (eight) hours as needed. 30 tablet 0   No current facility-administered medications for this visit.     Allergies  Allergen Reactions  . Levofloxacin Nausea And Vomiting    Other reaction(s): Confusion  . Gabapentin Other (See Comments)  Disoriented, no strength in legs    Past Medical History:  Diagnosis Date  . Aortic stenosis, mild   . Charcot foot due to diabetes mellitus (HCC)   . Diabetes mellitus   . HTN (hypertension)   . Hyperlipidemia   . Neuropathy   . Pancreatitis     Social History   Social History  . Marital status: Married    Spouse name: N/A  . Number of children: N/A  . Years of education: N/A   Occupational History  . Not on file.   Social History Main Topics  . Smoking status: Never Smoker  . Smokeless tobacco: Never Used  . Alcohol use No  . Drug use: No  . Sexual activity: Not on file   Other Topics Concern  . Not on file   Social History Narrative  . No narrative on file     Family History  Problem Relation Age of Onset  . Chronic Renal Failure Mother     . Diabetes Father   . Heart attack Father 55     Review of Systems: General: negative for chills, fever, night sweats or weight changes.  Cardiovascular: negative for chest pain, dyspnea on exertion, edema, orthopnea, palpitations, paroxysmal nocturnal dyspnea or shortness of breath Dermatological: negative for rash Respiratory: negative for cough or wheezing Urologic: negative for hematuria Abdominal: negative for nausea, vomiting, diarrhea, bright red blood per rectum, melena, or hematemesis Neurologic: negative for visual changes, syncope, or dizziness All other systems reviewed and are otherwise negative except as noted above.    Blood pressure 128/70, pulse 68, height 5\' 6"  (1.676 m), weight 126 lb 3.2 oz (57.2 kg).  General appearance: alert, cooperative, appears older than stated age, no distress and in wheelchair Neck: no carotid bruit and no JVD Lungs: clear to auscultation bilaterally Heart: regular rate and rhythm and 2/6 systolic murmur Extremities: dressing Rt foot, no edema Neurologic: Grossly normal    ASSESSMENT AND PLAN:   Acute combined systolic and diastolic congestive heart failure (HCC) Pt hospitalized 07/29/16-08/05/16- diuresed 16 lbs in the hospital and 10 lbs since discharge (wgt today 126 lbs)  Mild aortic stenosis Mild AS, EF 40-45% by echo March 2018  Chronic renal insufficiency, stage 3 (moderate) SCr 1.52 at discharge  Chronic diabetic ulcer of right foot  Followed by Dr Marylene Land- suspected PVD- dopplers pending   PLAN  I suggest she cut her Lasix back to 40 mg daily and stop Zaroxolyn (not previously on a diuretic). Check a BNP and BMP today. I will make sure her dopplers get scheduled. F/U with Dr Antoine Poche 4-6 weeks. If she continues to have intermittent chest pain we may need to pursue an ischemic work up but with her renal insufficiency cath would be reserved for high risk ischemia.   [name] PA-C 08/27/2016 2:43 PM

## 2016-08-27 NOTE — Assessment & Plan Note (Signed)
Pt hospitalized 07/29/16-08/05/16- diuresed 16 lbs in the hospital and 10 lbs since discharge (wgt today 126 lbs)

## 2016-08-27 NOTE — Assessment & Plan Note (Signed)
Followed by Dr Marylene Land- suspected PVD- dopplers pending

## 2016-08-27 NOTE — Patient Instructions (Addendum)
Medication Instructions: DECREASE your furosemide (Lasix) dose to 40mg  DAILY STOP metolazone (zaroxolyn) - please keep this bottle on-hand in case needed for future use   Labwork: BMET, BNP today   Testing/Procedures: Your physician has requested that you have a lower or upper extremity arterial duplex. This test is an ultrasound of the arteries in the legs or arms. It looks at arterial blood flow in the legs and arms. Allow one hour for Lower and Upper Arterial scans. There are no restrictions or special instructions  Your physician has requested that you have an ankle brachial index (ABI). During this test an ultrasound and blood pressure cuff are used to evaluate the arteries that supply the arms and legs with blood. Allow thirty minutes for this exam. There are no restrictions or special instructions.     Follow-Up: with Dr. Antoine Poche in 4-6 weeks.   Any Other Special Instructions Will Be Listed Below (If Applicable).     If you need a refill on your cardiac medications before your next appointment, please call your pharmacy.

## 2016-08-28 ENCOUNTER — Other Ambulatory Visit: Payer: Self-pay | Admitting: Family Medicine

## 2016-08-28 LAB — BRAIN NATRIURETIC PEPTIDE: Brain Natriuretic Peptide: 409.2 pg/mL — ABNORMAL HIGH (ref <100)

## 2016-09-06 ENCOUNTER — Ambulatory Visit (INDEPENDENT_AMBULATORY_CARE_PROVIDER_SITE_OTHER): Payer: Medicare Other | Admitting: Sports Medicine

## 2016-09-06 ENCOUNTER — Encounter: Payer: Self-pay | Admitting: Sports Medicine

## 2016-09-06 DIAGNOSIS — M14671 Charcot's joint, right ankle and foot: Secondary | ICD-10-CM | POA: Diagnosis not present

## 2016-09-06 DIAGNOSIS — E08621 Diabetes mellitus due to underlying condition with foot ulcer: Secondary | ICD-10-CM

## 2016-09-06 DIAGNOSIS — L97411 Non-pressure chronic ulcer of right heel and midfoot limited to breakdown of skin: Secondary | ICD-10-CM | POA: Diagnosis not present

## 2016-09-06 DIAGNOSIS — M79672 Pain in left foot: Secondary | ICD-10-CM

## 2016-09-06 DIAGNOSIS — M79671 Pain in right foot: Secondary | ICD-10-CM | POA: Diagnosis not present

## 2016-09-06 DIAGNOSIS — S90819A Abrasion, unspecified foot, initial encounter: Secondary | ICD-10-CM | POA: Diagnosis not present

## 2016-09-06 DIAGNOSIS — E1142 Type 2 diabetes mellitus with diabetic polyneuropathy: Secondary | ICD-10-CM

## 2016-09-06 NOTE — Progress Notes (Signed)
Subjective: Emily Miranda is a 76 y.o. female patient seen today in office for POV #22 (DOS 01-08-16), S/P Right foot wound debridement and placement of stravix allograft and status post, office excision of ulceration 02/29/2016 with pathology revealing traumatized tissue with no bacterial pathogens. Patient states that the pain is better and that she is on lasix after being discharged from hospital with improvement in swelling and they took off 14 liters of fluid, denies shortness of breath, nausea, vomiting, fever, or chills. No other issues noted.   Patient Active Problem List   Diagnosis Date Noted  . Chronic renal insufficiency, stage 3 (moderate) 08/27/2016  . Chronic diabetic ulcer of right foot  08/27/2016  . Loss of weight   . Acute combined systolic and diastolic congestive heart failure (HCC)   . Shortness of breath 07/29/2016  . Wound infection 11/13/2015  . Abdominal pain, RLQ (right lower quadrant) -chronic since 2009 06/02/2013  . Constipation, chronic 06/02/2013  . SUI (stress urinary incontinence, female) 06/02/2013  . Recurrent UTI (urinary tract infection) 06/02/2013  . HTN (hypertension)   . CHEST PAIN 11/27/2009  . OVERWEIGHT 11/28/2008  . DM 11/25/2008  . Mild aortic stenosis 11/25/2008  . PANCREATITIS, HX OF 11/25/2008    Current Outpatient Prescriptions on File Prior to Visit  Medication Sig Dispense Refill  . amLODipine (NORVASC) 5 MG tablet Take 5 mg by mouth daily.     . Ascorbic Acid (VITAMIN C) 1000 MG tablet Take 1,000 mg by mouth daily.    Marland Kitchen aspirin EC 81 MG EC tablet Take 1 tablet (81 mg total) by mouth daily. 30 tablet 0  . atorvastatin (LIPITOR) 40 MG tablet Take 1 tablet (40 mg total) by mouth daily at 6 PM. 30 tablet 3  . docusate sodium (COLACE) 100 MG capsule Take 1 capsule (100 mg total) by mouth 2 (two) times daily. 10 capsule 1  . fluticasone (FLONASE) 50 MCG/ACT nasal spray Place 1 spray into both nostrils daily as needed for allergies or  rhinitis (seasonal allergies).     . furosemide (LASIX) 40 MG tablet Take 1 tablet (40 mg total) by mouth daily. 30 tablet 5  . gabapentin (NEURONTIN) 300 MG capsule     . glimepiride (AMARYL) 2 MG tablet     . insulin glargine (LANTUS) 100 UNIT/ML injection Inject 0.12 mLs (12 Units total) into the skin at bedtime. 10 mL 11  . omeprazole (PRILOSEC) 20 MG capsule Take 20 mg by mouth daily.     . ONE TOUCH ULTRA TEST test strip 1 each by Other route once a week.     Bertram Gala Glycol-Propyl Glycol (SYSTANE OP) Place 1 drop into both eyes daily as needed (dry eyes).    . polyethylene glycol (MIRALAX / GLYCOLAX) packet Take 17 g by mouth daily as needed (constipation). Mix in 8 oz liquid and drink    . traMADol (ULTRAM) 50 MG tablet Take 1 tablet (50 mg total) by mouth every 8 (eight) hours as needed. 30 tablet 0   No current facility-administered medications on file prior to visit.     Allergies  Allergen Reactions  . Levofloxacin Nausea And Vomiting    Other reaction(s): Confusion  . Gabapentin Other (See Comments)    Disoriented, no strength in legs    Objective: There were no vitals filed for this visit.  General: No acute distress, AAOx3  Right foot: Healed plantar midfoot ulcer now with reactive keratosis, no erythema, no warmth, no active drainage, no  acute signs of infection noted, There is a new abrasion plantar lateral right foot with no signs of infection, decreased focal pain to right calf with decreased swelling and decreased pain to dorsal forefoot on right, Capillary fill time <3 seconds in all digits, gross sensation present via light touch to right foot. No pain or crepitation with range of motion right foot.  No pain with calf compression. Charcot foot.   Assessment and Plan:  Problem List Items Addressed This Visit    None    Visit Diagnoses    Diabetic ulcer of right midfoot associated with diabetes mellitus due to underlying condition, limited to breakdown of skin  (HCC)    -  Primary   healed   Abrasion, foot w/o infection       Diabetic polyneuropathy associated with type 2 diabetes mellitus (HCC)       Charcot ankle, right       Foot pain, bilateral         -Patient seen and evaluated -Applied antibiotic cream and guaze and paper tape bandage to abrasion on right foot -Chronic right plantar medial midfoot ulcer now healed;patient my shower as normal -[employer] diabetic shoe order form was completed; office to contact primary care for approval / certification;  Office to arrange shoe fitting and dispensing. -Continue with Tamadol as needed for pain -Continue with topical pain rub of lidocaine to right as needed for pain  -Continue with lasix as Rx by PCP -Continue self PT at home for weakness and may weightbear on right lower extremity with assistance of rolling walker and transport wheelchair -Return for diabetic shoes   Asencion Islam, DPM

## 2016-09-06 NOTE — Progress Notes (Signed)
   Subjective:    Patient ID: Emily Miranda, female    DOB: 21-Oct-1940, 76 y.o.   MRN: [id number]  HPI   I was in hospital for fluid on my lungs and heart and took off 14 liters in 3 days     Review of Systems     Objective:   Physical Exam        Assessment & Plan:

## 2016-09-07 ENCOUNTER — Other Ambulatory Visit: Payer: Self-pay | Admitting: Family Medicine

## 2016-09-13 ENCOUNTER — Other Ambulatory Visit: Payer: Self-pay

## 2016-09-13 MED ORDER — TRAMADOL HCL 50 MG PO TABS: 50.0000 mg | ORAL_TABLET | Freq: Three times a day (TID) | ORAL | 0 refills | Status: DC | PRN

## 2016-09-16 ENCOUNTER — Ambulatory Visit (HOSPITAL_COMMUNITY)
Admission: RE | Admit: 2016-09-16 | Discharge: 2016-09-16 | Disposition: A | Discharge status: Home / Self Care | Payer: Medicare Other | Source: Ambulatory Visit | Attending: Cardiovascular Disease | Admitting: Cardiovascular Disease

## 2016-09-16 ENCOUNTER — Encounter (HOSPITAL_COMMUNITY): Payer: Medicare Other

## 2016-09-16 DIAGNOSIS — R938 Abnormal findings on diagnostic imaging of other specified body structures: Secondary | ICD-10-CM | POA: Diagnosis not present

## 2016-09-16 DIAGNOSIS — L97419 Non-pressure chronic ulcer of right heel and midfoot with unspecified severity: Secondary | ICD-10-CM | POA: Insufficient documentation

## 2016-09-16 DIAGNOSIS — I70203 Unspecified atherosclerosis of native arteries of extremities, bilateral legs: Secondary | ICD-10-CM | POA: Diagnosis not present

## 2016-09-16 DIAGNOSIS — I10 Essential (primary) hypertension: Secondary | ICD-10-CM | POA: Diagnosis not present

## 2016-09-16 DIAGNOSIS — E08621 Diabetes mellitus due to underlying condition with foot ulcer: Secondary | ICD-10-CM | POA: Insufficient documentation

## 2016-09-16 DIAGNOSIS — E785 Hyperlipidemia, unspecified: Secondary | ICD-10-CM | POA: Diagnosis not present

## 2016-09-18 ENCOUNTER — Telehealth: Payer: Self-pay | Admitting: *Deleted

## 2016-09-18 MED ORDER — TRAMADOL HCL 50 MG PO TABS: 50.0000 mg | ORAL_TABLET | Freq: Three times a day (TID) | ORAL | 0 refills | Status: DC | PRN

## 2016-09-18 NOTE — Telephone Encounter (Addendum)
-----   Message from Asencion Islam, [place] sent at 09/18/2016 11:43 AM EDT ----- Regarding: Refill Tramadol  Hi David Towson Can you refill Tramadol for me for mrs Smick Thanks Dr. Marylene Land. I spoke with pt and Dr. Marylene Land had okayed the refill, because she has blockages in both legs and her doctor said to continue with the pain medication until they could perform a procedure. Called Tramadol to [phone].

## 2016-09-25 DIAGNOSIS — M79671 Pain in right foot: Secondary | ICD-10-CM | POA: Diagnosis not present

## 2016-09-25 DIAGNOSIS — M14671 Charcot's joint, right ankle and foot: Secondary | ICD-10-CM | POA: Diagnosis not present

## 2016-09-25 DIAGNOSIS — E11621 Type 2 diabetes mellitus with foot ulcer: Secondary | ICD-10-CM | POA: Diagnosis not present

## 2016-09-25 DIAGNOSIS — L97412 Non-pressure chronic ulcer of right heel and midfoot with fat layer exposed: Secondary | ICD-10-CM | POA: Diagnosis not present

## 2016-09-28 ENCOUNTER — Other Ambulatory Visit: Payer: Self-pay | Admitting: Family Medicine

## 2016-10-06 ENCOUNTER — Inpatient Hospital Stay (HOSPITAL_COMMUNITY)
Admission: AD | Admit: 2016-10-06 | Discharge: 2016-10-16 | DRG: 280 | Disposition: A | Discharge status: Home Health | Payer: Medicare Other | Source: Other Acute Inpatient Hospital | Attending: Family Medicine | Admitting: Family Medicine

## 2016-10-06 ENCOUNTER — Encounter (HOSPITAL_COMMUNITY): Payer: Self-pay | Admitting: Cardiology

## 2016-10-06 ENCOUNTER — Inpatient Hospital Stay (HOSPITAL_COMMUNITY): Payer: Medicare Other

## 2016-10-06 DIAGNOSIS — I214 Non-ST elevation (NSTEMI) myocardial infarction: Secondary | ICD-10-CM

## 2016-10-06 DIAGNOSIS — I25118 Atherosclerotic heart disease of native coronary artery with other forms of angina pectoris: Secondary | ICD-10-CM | POA: Diagnosis not present

## 2016-10-06 DIAGNOSIS — I110 Hypertensive heart disease with heart failure: Secondary | ICD-10-CM | POA: Diagnosis present

## 2016-10-06 DIAGNOSIS — R079 Chest pain, unspecified: Secondary | ICD-10-CM | POA: Diagnosis present

## 2016-10-06 DIAGNOSIS — R0602 Shortness of breath: Secondary | ICD-10-CM | POA: Diagnosis not present

## 2016-10-06 DIAGNOSIS — Z9861 Coronary angioplasty status: Secondary | ICD-10-CM

## 2016-10-06 DIAGNOSIS — I5043 Acute on chronic combined systolic (congestive) and diastolic (congestive) heart failure: Secondary | ICD-10-CM | POA: Diagnosis present

## 2016-10-06 DIAGNOSIS — I130 Hypertensive heart and chronic kidney disease with heart failure and stage 1 through stage 4 chronic kidney disease, or unspecified chronic kidney disease: Secondary | ICD-10-CM | POA: Diagnosis present

## 2016-10-06 DIAGNOSIS — K5900 Constipation, unspecified: Secondary | ICD-10-CM | POA: Diagnosis present

## 2016-10-06 DIAGNOSIS — I5041 Acute combined systolic (congestive) and diastolic (congestive) heart failure: Secondary | ICD-10-CM | POA: Diagnosis not present

## 2016-10-06 DIAGNOSIS — Z90710 Acquired absence of both cervix and uterus: Secondary | ICD-10-CM | POA: Diagnosis not present

## 2016-10-06 DIAGNOSIS — R0902 Hypoxemia: Secondary | ICD-10-CM

## 2016-10-06 DIAGNOSIS — Z794 Long term (current) use of insulin: Secondary | ICD-10-CM | POA: Diagnosis not present

## 2016-10-06 DIAGNOSIS — I43 Cardiomyopathy in diseases classified elsewhere: Secondary | ICD-10-CM

## 2016-10-06 DIAGNOSIS — E1122 Type 2 diabetes mellitus with diabetic chronic kidney disease: Secondary | ICD-10-CM | POA: Diagnosis present

## 2016-10-06 DIAGNOSIS — M7989 Other specified soft tissue disorders: Secondary | ICD-10-CM | POA: Diagnosis present

## 2016-10-06 DIAGNOSIS — E11621 Type 2 diabetes mellitus with foot ulcer: Secondary | ICD-10-CM | POA: Diagnosis not present

## 2016-10-06 DIAGNOSIS — D631 Anemia in chronic kidney disease: Secondary | ICD-10-CM | POA: Diagnosis present

## 2016-10-06 DIAGNOSIS — I350 Nonrheumatic aortic (valve) stenosis: Secondary | ICD-10-CM | POA: Diagnosis present

## 2016-10-06 DIAGNOSIS — E1140 Type 2 diabetes mellitus with diabetic neuropathy, unspecified: Secondary | ICD-10-CM | POA: Diagnosis present

## 2016-10-06 DIAGNOSIS — R252 Cramp and spasm: Secondary | ICD-10-CM | POA: Diagnosis present

## 2016-10-06 DIAGNOSIS — N183 Chronic kidney disease, stage 3 unspecified: Secondary | ICD-10-CM | POA: Diagnosis present

## 2016-10-06 DIAGNOSIS — I739 Peripheral vascular disease, unspecified: Secondary | ICD-10-CM

## 2016-10-06 DIAGNOSIS — Z8249 Family history of ischemic heart disease and other diseases of the circulatory system: Secondary | ICD-10-CM | POA: Diagnosis not present

## 2016-10-06 DIAGNOSIS — I2510 Atherosclerotic heart disease of native coronary artery without angina pectoris: Secondary | ICD-10-CM | POA: Diagnosis present

## 2016-10-06 DIAGNOSIS — N179 Acute kidney failure, unspecified: Secondary | ICD-10-CM | POA: Diagnosis present

## 2016-10-06 DIAGNOSIS — E782 Mixed hyperlipidemia: Secondary | ICD-10-CM | POA: Diagnosis not present

## 2016-10-06 DIAGNOSIS — R946 Abnormal results of thyroid function studies: Secondary | ICD-10-CM | POA: Diagnosis present

## 2016-10-06 DIAGNOSIS — I10 Essential (primary) hypertension: Secondary | ICD-10-CM | POA: Diagnosis not present

## 2016-10-06 DIAGNOSIS — E1142 Type 2 diabetes mellitus with diabetic polyneuropathy: Secondary | ICD-10-CM | POA: Diagnosis present

## 2016-10-06 DIAGNOSIS — Z833 Family history of diabetes mellitus: Secondary | ICD-10-CM | POA: Diagnosis not present

## 2016-10-06 DIAGNOSIS — Z7951 Long term (current) use of inhaled steroids: Secondary | ICD-10-CM

## 2016-10-06 DIAGNOSIS — I509 Heart failure, unspecified: Secondary | ICD-10-CM | POA: Diagnosis not present

## 2016-10-06 DIAGNOSIS — I200 Unstable angina: Secondary | ICD-10-CM | POA: Diagnosis not present

## 2016-10-06 DIAGNOSIS — L97519 Non-pressure chronic ulcer of other part of right foot with unspecified severity: Secondary | ICD-10-CM | POA: Diagnosis present

## 2016-10-06 DIAGNOSIS — Z7982 Long term (current) use of aspirin: Secondary | ICD-10-CM

## 2016-10-06 DIAGNOSIS — E1151 Type 2 diabetes mellitus with diabetic peripheral angiopathy without gangrene: Secondary | ICD-10-CM | POA: Diagnosis present

## 2016-10-06 DIAGNOSIS — I25110 Atherosclerotic heart disease of native coronary artery with unstable angina pectoris: Secondary | ICD-10-CM | POA: Diagnosis not present

## 2016-10-06 DIAGNOSIS — Z79899 Other long term (current) drug therapy: Secondary | ICD-10-CM | POA: Diagnosis not present

## 2016-10-06 HISTORY — DX: Non-ST elevation (NSTEMI) myocardial infarction: I21.4

## 2016-10-06 LAB — RETICULOCYTES
RBC.: 2.96 MIL/uL — ABNORMAL LOW (ref 3.87–5.11)
Retic Count, Absolute: 79.9 10*3/uL (ref 19.0–186.0)
Retic Ct Pct: 2.7 % (ref 0.4–3.1)

## 2016-10-06 LAB — IRON AND TIBC
Iron: 23 ug/dL — ABNORMAL LOW (ref 28–170)
Saturation Ratios: 11 % (ref 10.4–31.8)
TIBC: 213 ug/dL — ABNORMAL LOW (ref 250–450)
UIBC: 190 ug/dL

## 2016-10-06 LAB — MAGNESIUM: Magnesium: 2.1 mg/dL (ref 1.7–2.4)

## 2016-10-06 LAB — TROPONIN I
Troponin I: 0.83 ng/mL (ref <0.03)
Troponin I: 0.79 ng/mL (ref <0.03)

## 2016-10-06 LAB — FERRITIN: Ferritin: 127 ng/mL (ref 11–307)

## 2016-10-06 LAB — GLUCOSE, CAPILLARY
Glucose-Capillary: 187 mg/dL — ABNORMAL HIGH (ref 65–99)
Glucose-Capillary: 207 mg/dL — ABNORMAL HIGH (ref 65–99)

## 2016-10-06 LAB — TSH: TSH: 5.876 u[IU]/mL — ABNORMAL HIGH (ref 0.350–4.500)

## 2016-10-06 LAB — VITAMIN B12: Vitamin B-12: 920 pg/mL — ABNORMAL HIGH (ref 180–914)

## 2016-10-06 LAB — T4, FREE: Free T4: 1.26 ng/dL — ABNORMAL HIGH (ref 0.61–1.12)

## 2016-10-06 LAB — FOLATE: Folate: 25.9 ng/mL (ref >5.9)

## 2016-10-06 MED ORDER — TRAMADOL HCL 50 MG PO TABS
50.0000 mg | ORAL_TABLET | Freq: Four times a day (QID) | ORAL | Status: DC | PRN
  Administered 2016-10-06 – 2016-10-15 (×15): 50 mg via ORAL
  Filled 2016-10-06 (×15): qty 1

## 2016-10-06 MED ORDER — FLUTICASONE PROPIONATE 50 MCG/ACT NA SUSP
1.0000 | Freq: Every day | NASAL | Status: DC | PRN
  Filled 2016-10-06: qty 16

## 2016-10-06 MED ORDER — INSULIN ASPART 100 UNIT/ML ~~LOC~~ SOLN
0.0000 [IU] | Freq: Three times a day (TID) | SUBCUTANEOUS | Status: DC
Start: 2016-10-06 — End: 2016-10-13
  Administered 2016-10-06: 2 [IU] via SUBCUTANEOUS
  Administered 2016-10-07 (×3): 1 [IU] via SUBCUTANEOUS
  Administered 2016-10-08: 3 [IU] via SUBCUTANEOUS
  Administered 2016-10-08 – 2016-10-09 (×3): 1 [IU] via SUBCUTANEOUS
  Administered 2016-10-09: 2 [IU] via SUBCUTANEOUS
  Administered 2016-10-09: 1 [IU] via SUBCUTANEOUS
  Administered 2016-10-10: 2 [IU] via SUBCUTANEOUS
  Administered 2016-10-10: 3 [IU] via SUBCUTANEOUS
  Administered 2016-10-10: 2 [IU] via SUBCUTANEOUS
  Administered 2016-10-11: 5 [IU] via SUBCUTANEOUS
  Administered 2016-10-11: 1 [IU] via SUBCUTANEOUS
  Administered 2016-10-12: 2 [IU] via SUBCUTANEOUS
  Administered 2016-10-12: 3 [IU] via SUBCUTANEOUS
  Administered 2016-10-12: 2 [IU] via SUBCUTANEOUS
  Administered 2016-10-13: 3 [IU] via SUBCUTANEOUS

## 2016-10-06 MED ORDER — ACETAMINOPHEN 325 MG PO TABS
650.0000 mg | ORAL_TABLET | ORAL | Status: DC | PRN
  Administered 2016-10-06 – 2016-10-14 (×7): 650 mg via ORAL
  Filled 2016-10-06 (×7): qty 2

## 2016-10-06 MED ORDER — SODIUM CHLORIDE 0.9 % IV SOLN
INTRAVENOUS | Status: DC
  Administered 2016-10-06: 16:00:00 via INTRAVENOUS

## 2016-10-06 MED ORDER — ALPRAZOLAM 0.25 MG PO TABS
0.2500 mg | ORAL_TABLET | Freq: Two times a day (BID) | ORAL | Status: DC | PRN
  Administered 2016-10-08 – 2016-10-15 (×3): 0.25 mg via ORAL
  Filled 2016-10-06 (×3): qty 1

## 2016-10-06 MED ORDER — METOPROLOL TARTRATE 12.5 MG HALF TABLET
12.5000 mg | ORAL_TABLET | Freq: Two times a day (BID) | ORAL | Status: DC
  Administered 2016-10-06 – 2016-10-15 (×19): 12.5 mg via ORAL
  Filled 2016-10-06 (×19): qty 1

## 2016-10-06 MED ORDER — DOCUSATE SODIUM 100 MG PO CAPS
100.0000 mg | ORAL_CAPSULE | Freq: Two times a day (BID) | ORAL | Status: DC
  Administered 2016-10-06 – 2016-10-15 (×19): 100 mg via ORAL
  Filled 2016-10-06 (×21): qty 1

## 2016-10-06 MED ORDER — INSULIN GLARGINE 100 UNIT/ML ~~LOC~~ SOLN
6.0000 [IU] | Freq: Every day | SUBCUTANEOUS | Status: DC
  Administered 2016-10-06 – 2016-10-08 (×3): 6 [IU] via SUBCUTANEOUS
  Filled 2016-10-06 (×3): qty 0.06

## 2016-10-06 MED ORDER — ATORVASTATIN CALCIUM 40 MG PO TABS: 40.0000 mg | ORAL_TABLET | Freq: Every day | ORAL | Status: DC

## 2016-10-06 MED ORDER — INSULIN ASPART 100 UNIT/ML ~~LOC~~ SOLN
0.0000 [IU] | Freq: Every day | SUBCUTANEOUS | Status: DC
  Administered 2016-10-06 – 2016-10-07 (×2): 2 [IU] via SUBCUTANEOUS
  Administered 2016-10-09: 3 [IU] via SUBCUTANEOUS
  Administered 2016-10-10 – 2016-10-14 (×3): 2 [IU] via SUBCUTANEOUS

## 2016-10-06 MED ORDER — POLYETHYL GLYCOL-PROPYL GLYCOL 0.4-0.3 % OP GEL: Freq: Every day | OPHTHALMIC | Status: DC | PRN

## 2016-10-06 MED ORDER — ORAL CARE MOUTH RINSE
15.0000 mL | Freq: Two times a day (BID) | OROMUCOSAL | Status: DC
  Administered 2016-10-06 – 2016-10-15 (×18): 15 mL via OROMUCOSAL

## 2016-10-06 MED ORDER — ARTIFICIAL TEARS OP OINT
TOPICAL_OINTMENT | Freq: Every day | OPHTHALMIC | Status: DC | PRN
  Filled 2016-10-06: qty 3.5

## 2016-10-06 MED ORDER — PANTOPRAZOLE SODIUM 40 MG PO TBEC
40.0000 mg | DELAYED_RELEASE_TABLET | Freq: Every day | ORAL | Status: DC
  Administered 2016-10-06 – 2016-10-16 (×11): 40 mg via ORAL
  Filled 2016-10-06 (×11): qty 1

## 2016-10-06 MED ORDER — HEPARIN BOLUS VIA INFUSION
3500.0000 [IU] | Freq: Once | INTRAVENOUS | Status: AC
  Administered 2016-10-06: 3500 [IU] via INTRAVENOUS
  Filled 2016-10-06: qty 3500

## 2016-10-06 MED ORDER — AMLODIPINE BESYLATE 5 MG PO TABS
5.0000 mg | ORAL_TABLET | Freq: Every day | ORAL | Status: DC
  Administered 2016-10-06 – 2016-10-16 (×11): 5 mg via ORAL
  Filled 2016-10-06 (×11): qty 1

## 2016-10-06 MED ORDER — ASPIRIN EC 81 MG PO TBEC: 81.0000 mg | DELAYED_RELEASE_TABLET | Freq: Every day | ORAL | Status: DC

## 2016-10-06 MED ORDER — POLYETHYLENE GLYCOL 3350 17 G PO PACK
17.0000 g | PACK | Freq: Every day | ORAL | Status: DC | PRN
  Administered 2016-10-08 – 2016-10-09 (×2): 17 g via ORAL
  Filled 2016-10-06 (×2): qty 1

## 2016-10-06 MED ORDER — HEPARIN (PORCINE) IN NACL 100-0.45 UNIT/ML-% IJ SOLN
1450.0000 [IU]/h | INTRAMUSCULAR | Status: DC
  Administered 2016-10-06: 800 [IU]/h via INTRAVENOUS
  Administered 2016-10-09: 1300 [IU]/h via INTRAVENOUS
  Administered 2016-10-10: 1450 [IU]/h via INTRAVENOUS
  Filled 2016-10-06 (×5): qty 250

## 2016-10-06 MED ORDER — ASPIRIN 81 MG PO CHEW
81.0000 mg | CHEWABLE_TABLET | Freq: Every day | ORAL | Status: DC
  Administered 2016-10-06 – 2016-10-10 (×6): 81 mg via ORAL
  Filled 2016-10-06 (×6): qty 1

## 2016-10-06 MED ORDER — NITROGLYCERIN 0.4 MG SL SUBL
0.4000 mg | SUBLINGUAL_TABLET | SUBLINGUAL | Status: DC | PRN
  Administered 2016-10-08 (×3): 0.4 mg via SUBLINGUAL
  Filled 2016-10-06: qty 1

## 2016-10-06 MED ORDER — FUROSEMIDE 10 MG/ML IJ SOLN
40.0000 mg | Freq: Two times a day (BID) | INTRAMUSCULAR | Status: AC
  Administered 2016-10-06 – 2016-10-08 (×5): 40 mg via INTRAVENOUS
  Filled 2016-10-06 (×4): qty 4

## 2016-10-06 MED ORDER — NITROGLYCERIN 2 % TD OINT
0.5000 [in_us] | TOPICAL_OINTMENT | Freq: Four times a day (QID) | TRANSDERMAL | Status: DC
  Administered 2016-10-06 – 2016-10-11 (×16): 0.5 [in_us] via TOPICAL
  Filled 2016-10-06 (×2): qty 30

## 2016-10-06 MED ORDER — ONDANSETRON HCL 4 MG/2ML IJ SOLN: 4.0000 mg | Freq: Four times a day (QID) | INTRAMUSCULAR | Status: DC | PRN

## 2016-10-06 MED ORDER — ATORVASTATIN CALCIUM 40 MG PO TABS
40.0000 mg | ORAL_TABLET | Freq: Every day | ORAL | Status: DC
  Administered 2016-10-06 – 2016-10-15 (×9): 40 mg via ORAL
  Filled 2016-10-06 (×8): qty 1

## 2016-10-06 NOTE — Progress Notes (Deleted)
HPI The patient presents for follow up of aortic stenosis.  Since I last saw her she had joint surgery.  She was in the hospital in March of this year with acute on chronic diastolic and systolic HF.  She did have an elevated troponin but was managed conservatively secondary to CKD.   An echo showed mild AS, grade 2 DD, and an EF of 40-45%. Her discharge wgt was 138 lbs. She was discharged on Lasix 80 mg BID and Zaroxolyn 2.5 mg daily.  When she was seen in the office last month her Lasix was reduced and Zaroxolyn was reduced.   She also had ABIs which are reported below.   ***      I last saw her in 2015.  Echo in Jan of 2016 demonstrated only moderate AS.  Both feet for soft boots.   Ulcer on right foot and fracture heal on left.  With her level of activity she is doing OK.  The patient denies any new symptoms such as chest discomfort, neck or arm discomfort. There has been no new shortness of breath, PND or orthopnea. There have been no reported palpitations, presyncope or syncope.    Allergies  Allergen Reactions  . Levofloxacin Nausea And Vomiting    Other reaction(s): Confusion  . Gabapentin Other (See Comments)    Disoriented, no strength in legs    No current facility-administered medications for this visit.    No current outpatient prescriptions on file.   Facility-Administered Medications Ordered in Other Visits  Medication Dose Route Frequency Provider Last Rate Last Dose  . 0.9 %  sodium chloride infusion   Intravenous Continuous Leone Brand, NP      . acetaminophen (TYLENOL) tablet 650 mg  650 mg Oral Q4H PRN Leone Brand, NP      . ALPRAZolam Prudy Feeler) tablet 0.25 mg  0.25 mg Oral BID PRN Leone Brand, NP      . amLODipine (NORVASC) tablet 5 mg  5 mg Oral Daily Leone Brand, NP      . artificial tears (LACRILUBE) ophthalmic ointment   Both Eyes Daily PRN Allena Katz, RPH      . aspirin chewable tablet 81 mg  81 mg Oral Daily Leone Brand, NP      .  atorvastatin (LIPITOR) tablet 40 mg  40 mg Oral q1800 Leone Brand, NP      . docusate sodium (COLACE) capsule 100 mg  100 mg Oral BID Leone Brand, NP      . fluticasone (FLONASE) 50 MCG/ACT nasal spray 1 spray  1 spray Each Nare Daily PRN Leone Brand, NP      . furosemide (LASIX) injection 40 mg  40 mg Intravenous Q12H Nada Boozer R, NP      . heparin ADULT infusion 100 units/mL (25000 units/281mL sodium chloride 0.45%)  800 Units/hr Intravenous Continuous Mosetta Anis, RPH      . heparin bolus via infusion 3,500 Units  3,500 Units Intravenous Once Mosetta Anis, [facility]      . insulin aspart (novoLOG) injection 0-5 Units  0-5 Units Subcutaneous QHS Nada Boozer R, NP      . insulin aspart (novoLOG) injection 0-9 Units  0-9 Units Subcutaneous TID WC Leone Brand, NP      . insulin glargine (LANTUS) injection 6 Units  6 Units Subcutaneous QHS Nada Boozer R, NP      . metoprolol tartrate (LOPRESSOR) tablet 12.5 mg  12.5 mg Oral BID Leone Brand, NP      . nitroGLYCERIN (NITROGLYN) 2 % ointment 0.5 inch  0.5 inch Topical Q6H Leone Brand, NP      . nitroGLYCERIN (NITROSTAT) SL tablet 0.4 mg  0.4 mg Sublingual Q5 Min x 3 PRN Leone Brand, NP      . ondansetron [facility]) injection 4 mg  4 mg Intravenous Q6H PRN Leone Brand, NP      . pantoprazole (PROTONIX) EC tablet 40 mg  40 mg Oral Daily Nada Boozer R, NP      . polyethylene glycol (MIRALAX / GLYCOLAX) packet 17 g  17 g Oral Daily PRN Leone Brand, NP      . traMADol Janean Sark) tablet 50 mg  50 mg Oral Q6H PRN Leone Brand, NP        Past Medical History:  Diagnosis Date  . Aortic stenosis, mild   . Charcot foot due to diabetes mellitus (HCC)   . CHF (congestive heart failure) (HCC)   . Diabetes mellitus   . HTN (hypertension)   . Hyperlipidemia   . Neuropathy   . Pancreatitis     Past Surgical History:  Procedure Laterality Date  . ABDOMINAL HYSTERECTOMY    . APPENDECTOMY    . FOOT SURGERY     . LAPAROSCOPIC INCISIONAL / UMBILICAL / VENTRAL HERNIA REPAIR  01/04/2008   Dr Bertram Savin  . LAPAROSCOPIC LYSIS OF ADHESIONS  2007   Dr Donovan Kail  . OVARIAN CYST REMOVAL    . ROBOT ASSISTED PYELOPLASTY  02/2007   Dr Laverle Patter  . SUTURE REMOVAL  08/17/2009   Dr Bertram Savin .  Right paramedian [employer] stitch  . TONSILLECTOMY      ROS:    ***  PHYSICAL EXAM There were no vitals taken for this visit.  GENERAL:  Well appearing HEENT:  Pupils equal round and reactive, fundi not visualized, oral mucosa unremarkable NECK:  No jugular venous distention, waveform within normal limits, carotid upstroke brisk and symmetric, no bruits, no thyromegaly LYMPHATICS:  No cervical, inguinal adenopathy LUNGS:  Clear to auscultation bilaterally BACK:  No CVA tenderness CHEST:  Unremarkable HEART:  PMI not displaced or sustained,S1 and S2 within normal limits, no S3, no S4, no clicks, no rubs, *** murmurs ABD:  Flat, positive bowel sounds normal in frequency in pitch, no bruits, no rebound, no guarding, no midline pulsatile mass, no hepatomegaly, no splenomegaly EXT:  2 plus pulses throughout, no edema, no cyanosis no clubbing SKIN:  No rashes no nodules NEURO:  Cranial nerves II through XII grossly intact, motor grossly intact throughout PSYCH:  Cognitively intact, oriented to person place and time  GENERAL:  Well appearing NECK:  No jugular venous distention, waveform within normal limits, carotid upstroke brisk and symmetric, no bruits, no thyromegaly LYMPHATICS:  No cervical, inguinal adenopathy LUNGS:  Clear to auscultation bilaterally BACK:  No CVA tenderness CHEST:  Unremarkable HEART:  PMI not displaced or sustained,S1 and S2 within normal limits, no S3, no S4, no clicks, no rubs.  Apical early peaking systolic murmur heard out the aortic outflow tract. ABD:  Flat, positive bowel sounds normal in frequency in pitch, no bruits, no rebound, no guarding, no midline pulsatile mass, no  hepatomegaly, no splenomegaly EXT:  2 plus pulses throughout, no edema, no cyanosis no clubbing, ***  ABI:  30-49% stenosis in the right CFA and SFA at ostium. 50-74% stenosis in the right mid and distal SFA  and popliteal artery.  75-99% stenosis in the right proximal ATA. 30-49% stenosis in the left CFA. 75-99% stenosis in the distal SFA. Two vessel run-off on the right via the peroneal artery and ATA; short segment distal occlusion in the right PTA. Three vessel run off on the left.    EKG:  Sinus rhythm, rate *** , axis within normal limits, intervals within normal limits, no acute ST-T wave changes, poor anterior R-wave progression  10/06/2016   ASSESSMENT AND PLAN  ACUTE ON CHRONIC SYSTOLIC AND DIASTOLIC HF:  ***  PVD:  ***   HTN (hypertension) -   ***  I am going to change her from hydrochlorothiazide to chlorthalidone as this has a better result with blood pressure control.    AORTIC STENOSIS -  This was mild on recent echo.  ***   hasn't changed on physical exam she is having no new symptoms. I will follow this clinically. Repeat echocardiography is not indicated at this time.    CKD STAGE III:  *** ***

## 2016-10-06 NOTE — Progress Notes (Signed)
Pt arrived on the unit. Alert and oriented, denies pain. Morphine was given in route, which dropped  Her O2 saturation. 6L at this time.

## 2016-10-06 NOTE — H&P (Signed)
Emily Miranda is an 76 y.o. female.    Primary Cardiologist:Dr. Vita Barley  PCP:  Angelina Sheriff, MD  Chief Complaint: pt transferred from [place] after presenting this AM with crushing chest pain.    HPI:  8 YOF with hx of moderate AS on Echo 05/2014, EF 55% and G2DD.  No hx of CAD or MI.  + HTN< chronic UTI, renal insuff, HLD, DM with neuropathy, nephropathy and Charcot Rt foot with chronic foot ulcer s/p multiple procedures.  Recent admit at [facility] 07/2016 with chest pain and SOB.  Troponin + with pk of 0.53, Cr was elevated at 1.83 so no ischemic work up. BNP was >2000.   She was diuresed and improved. Echo at that time EF 40-45%, mild AS G2DD.  D/c wt was 138 lbs.  On follow up in office in April she did feel better except for fatigue.  Her diuretics decreased and cr was 2.03. BNP was down to 409.   She was to see Dr. Vita Barley on 5/29.    Over last several days she has had chest pressure with radiation into her jaws bil.  lst night she was SOB with this pain and had to sit up to sleep though she did not sleep much. Today pain became crushing pressure and Vicodan did not help.  Went to ER.  There Morphine gave most help.  + lower ext edema that has increased as well and  Her Charcot foot with ulcer, ulcer has healed.    LABS Hgb 7.9 Hct 25 ( last admit with microcytic anemia and rec'd 1 UPRBCs)  She has rec'd aranesp as outpt.  WBC 10.2 plts 600  K+ 4.6, CO2 18, anion gap 21, BUN 39 and Cr 1.60.  Glucose 235, lfts wnl.    Troponin I 0.93.  Pro BNP 23,200  EKG SR 1st degree AV block  Poor R wave progression, but similar to 07/2016 EKG. I personally reviewed.   CXR mild central pulmonary vascular congestion with mild bibasalr edema or atelectasis, mild bilateral pl effusions noted.  In ER she rec'd 1 325 mg ASA, 25 mg metoprolol, morphine 4 mg IV once, NTG 1 inch, and zofran 4 mg IV.  Currently [facility] at [facility].  Had more morphine by EMS on way to [facility] now pain free.  +  SOB continues.  No recent colds or fevers.  No rapid HR.       Past Medical History:  Diagnosis Date  . Aortic stenosis, mild   . Charcot foot due to diabetes mellitus ([place])   . CHF (congestive heart failure) ([place])   . Diabetes mellitus   . HTN (hypertension)   . Hyperlipidemia   . Neuropathy   . Pancreatitis     Past Surgical History:  Procedure Laterality Date  . ABDOMINAL HYSTERECTOMY    . APPENDECTOMY    . FOOT SURGERY    . LAPAROSCOPIC INCISIONAL / UMBILICAL / VENTRAL HERNIA REPAIR  01/04/2008   Dr Ronnald Collum  . LAPAROSCOPIC LYSIS OF ADHESIONS  2007   Dr Harle Battiest  . OVARIAN CYST REMOVAL    . ROBOT ASSISTED PYELOPLASTY  02/2007   Dr Alinda Money  . SUTURE REMOVAL  08/17/2009   Dr Ronnald Collum .  Right paramedian GoreTex stitch  . TONSILLECTOMY      Family History  Problem Relation Age of Onset  . Chronic Renal Failure Mother   . Diabetes Father   .  Heart attack Father 29   Social History:  reports that she has never smoked. She has never used smokeless tobacco. She reports that she does not drink alcohol or use drugs.  Allergies:  Allergies  Allergen Reactions  . Levofloxacin Nausea And Vomiting    Other reaction(s): Confusion  . Gabapentin Other (See Comments)    Disoriented, no strength in legs    Medications Prior to Admission  Medication Sig Dispense Refill  . amLODipine (NORVASC) 5 MG tablet Take 5 mg by mouth daily.     . Ascorbic Acid (VITAMIN C) 1000 MG tablet Take 1,000 mg by mouth daily.    Marland Kitchen aspirin EC 81 MG EC tablet Take 1 tablet (81 mg total) by mouth daily. 30 tablet 0  . atorvastatin (LIPITOR) 40 MG tablet Take 1 tablet (40 mg total) by mouth daily at 6 PM. 30 tablet 3  . docusate sodium (COLACE) 100 MG capsule Take 1 capsule (100 mg total) by mouth 2 (two) times daily. 10 capsule 1  . fluticasone (FLONASE) 50 MCG/ACT nasal spray Place 1 spray into both nostrils daily as needed for allergies or rhinitis (seasonal allergies).     . furosemide  (LASIX) 40 MG tablet Take 1 tablet (40 mg total) by mouth daily. 30 tablet 5  . gabapentin (NEURONTIN) 300 MG capsule     . glimepiride (AMARYL) 2 MG tablet     . insulin glargine (LANTUS) 100 UNIT/ML injection Inject 0.12 mLs (12 Units total) into the skin at bedtime. 10 mL 11  . omeprazole (PRILOSEC) 20 MG capsule Take 20 mg by mouth daily.     . ONE TOUCH ULTRA TEST test strip 1 each by Other route once a week.     Vladimir Faster Glycol-Propyl Glycol (SYSTANE OP) Place 1 drop into both eyes daily as needed (dry eyes).    . polyethylene glycol (MIRALAX / GLYCOLAX) packet Take 17 g by mouth daily as needed (constipation). Mix in 8 oz liquid and drink    . traMADol (ULTRAM) 50 MG tablet Take 1 tablet (50 mg total) by mouth every 8 (eight) hours as needed. 30 tablet 0  . traMADol (ULTRAM) 50 MG tablet Take 1 tablet (50 mg total) by mouth every 8 (eight) hours as needed. 30 tablet 0    No results found for this or any previous visit (from the past 48 hour(s)). No results found.  ROS: General:no colds or fevers, no weight changes Skin:no rashes or ulcers previous ulcer healed HEENT:no blurred vision, no congestion CV:see HPI PUL:see HPI GI:no diarrhea constipation or melena, no indigestion GU:no hematuria, no dysuria MS:no joint pain, no claudication Neuro:no syncope, no lightheadedness Endo:+ diabetes insulin recently adjusted, no thyroid disease  Blood pressure 125/69, pulse 76, temperature 98.1 F (36.7 C), temperature source Oral, resp. rate (!) 21, height '5\' 6"'$  (1.676 m), weight 147 lb 4.3 oz (66.8 kg), SpO2 95 %. PE: General:Pleasant affect, NAD Skin:Warm and dry, brisk capillary refill HEENT:normocephalic, sclera clear, mucus membranes moist Neck:supple, + JVD, no bruits  Heart:S1S2 RRR with 2/6 systolic murmur, no gallup, rub or click Lungs:diminished breath sounds without rales, rhonchi, or wheezes BSW:HQPR, non tender, + BS, do not palpate liver spleen or masses Ext:2-3+ lower  ext edema to knees, 2+ pedal pulses, 2+ radial pulses Neuro:alert and oriented X 3, MAE, follows commands, + facial symmetry    Assessment/Plan Principal Problem:   Non-ST elevation (NSTEMI) myocardial infarction [facility]) Active Problems:   DM type 2 causing CKD stage 3 (  [place])   Mild aortic stenosis   CHEST PAIN   HTN (hypertension)   Shortness of breath   Acute combined systolic and diastolic congestive heart failure (HCC)   Chronic renal insufficiency, stage 3 (moderate)   Chronic diabetic ulcer of right foot   NSTEMI - with 2 episodes of chest pain this being the worst.  She is without pain now with morphine, will continue serial troponin. Recheck EKG  If kidney function allows hope to cath.   Once more stable.  - anticoagulation will add IV heparin    - NTG paste for now though if pain returns will add IV NTG -add low dose BB  Dr. Rayann Heman to see  Acute systolic and diastolic HF - IV diureses monitor Cr.  Anemia- hx of microcytic anemia was on aranesp as outpt but none recently with hospitalizations.  Will ask [facility] to follow  DM-2 recent increase of insulin will ask [facility] to follow   HTN controlled  CKD 3 monitor with recent AKI.  AS mild   Cecilie Kicks Nurse Practitioner Certified [place] Pager [phone] or after 5pm or weekends call [phone] 10/06/2016, 12:38 PM  I have seen, examined the patient, and reviewed the above assessment and plan.  on exam she is chronically ill.  + edema.  Changes to above are made where necessary.  She has multiple medical issues and delivery of care is thereby complicated.  She has worsening anemia, CRI, acute on chronic systolic dysfunction, and symptoms of angina.  Will plan medical optimization, diuresis, medicine consultation for her anemia, and likely cardiac cath on Tuesday or Wednesday after her other medical issues have been stabilized. She is ill.  Her prognosis is guarded.  A high level of decision making was  required for this encounter.   Co Sign: Thompson Grayer, MD 10/06/2016 2:02 PM

## 2016-10-06 NOTE — Progress Notes (Signed)
ANTICOAGULATION CONSULT NOTE - Initial Consult  Pharmacy Consult for heparin Indication: chest pain/ACS  Allergies  Allergen Reactions  . Levofloxacin Nausea And Vomiting    Other reaction(s): Confusion  . Gabapentin Other (See Comments)    Disoriented, no strength in legs    Patient Measurements: Height: 5\' 6"  (167.6 cm) Weight: 147 lb 4.3 oz (66.8 kg) IBW/kg (Calculated) : 59.3 Heparin Dosing Weight: 66.8 kg  Vital Signs: Temp: 98.1 F (36.7 C) (05/27 1221) Temp Source: Oral (05/27 1221) BP: 95/79 (05/27 1346) Pulse Rate: 86 (05/27 1346)  Labs: No results for input(s): HGB, HCT, PLT, APTT, LABPROT, INR, HEPARINUNFRC, HEPRLOWMOCWT, CREATININE, CKTOTAL, CKMB, TROPONINI in the last 72 hours.  CrCl cannot be calculated (Patient's most recent lab result is older than the maximum 21 days allowed.).   Medical History: Past Medical History:  Diagnosis Date  . Aortic stenosis, mild   . Charcot foot due to diabetes mellitus (HCC)   . CHF (congestive heart failure) (HCC)   . Diabetes mellitus   . HTN (hypertension)   . Hyperlipidemia   . Neuropathy   . Pancreatitis     Assessment: 66 yoF presents from OSH with crushing CP to start on heparin per pharmacy for ACS r/o. Pt has no OAC noted on PTA list and does not appear to have received heparin at OSH. Baseline CBC not available yet. Plan per cards is to treat medically and consider cardiac cath Tuesday.  Goal of Therapy:  Heparin level 0.3-0.7 units/ml Monitor platelets by anticoagulation protocol: Yes   Plan:  -Heparin 3500 units x1 -Heparin 800 units/hr -Check 8-hr heparin level -Monitor heparin level, CBC, S/Sx bleeding daily  [place] Highland, PharmD PGY-1 Pharmacy Resident Pager: [phone] 10/06/2016

## 2016-10-06 NOTE — Progress Notes (Signed)
Troponin of 0.83 was reported to NP immediately(1623/10/07/2018). Aware.

## 2016-10-06 NOTE — Progress Notes (Signed)
Pt is comfortable. Delay on MD explained.  Bed in low position, alarms are on, call bell in reach, continue to monitor.

## 2016-10-06 NOTE — Consult Note (Signed)
Medical Consultation   Emily Miranda  ZOX:[id number]  DOB: 1940-06-10  DOA: 10/06/2016  PCP: Noni Saupe, MD   Requesting physician: CARDs  Reason for consultation: DM and Anemia   History of Present Illness: Emily Miranda is an 76 y.o. female with past history of diastolic CHF, moderate aortic stenosis, hypertension, diabetes recently started on insulin, chronic anemia, neuropathy, chronic kidney disease with baseline creatinine of 1.5-1.8 admitted today from [facility] emergency room for  NSTEMI and CHF. TRH consulted for evaluation of anemia and diabetes on insulin. Patient reports having some kidney disease for a few years now is not followed by a nephrologist. She has also been noted to have anemia for several months, few months ago she saw a hematologist in [place] Dr. Jola Babinski and was subsequently started on Aranesp injections, of which she has had 2 or 3 in the recent past. She denies any blood in her stools or black stools, she is up-to-date on colonoscopies and her last one was about 3 years ago, which was reportedly normal. TOdays labs with Hb of 7.9, creatinine 1.6  Review of Systems:  ROS All systems reviewed and negative except per history of present illness   Past Medical History: Past Medical History:  Diagnosis Date  . Aortic stenosis, mild   . Charcot foot due to diabetes mellitus (HCC)   . CHF (congestive heart failure) (HCC)   . Diabetes mellitus   . HTN (hypertension)   . Hyperlipidemia   . Neuropathy   . Pancreatitis     Past Surgical History: Past Surgical History:  Procedure Laterality Date  . ABDOMINAL HYSTERECTOMY    . APPENDECTOMY    . FOOT SURGERY    . LAPAROSCOPIC INCISIONAL / UMBILICAL / VENTRAL HERNIA REPAIR  01/04/2008   Dr Bertram Savin  . LAPAROSCOPIC LYSIS OF ADHESIONS  2007   Dr Donovan Kail  . OVARIAN CYST REMOVAL    . ROBOT ASSISTED PYELOPLASTY  02/2007   Dr Laverle Patter  . SUTURE REMOVAL  08/17/2009   Dr  Bertram Savin .  Right paramedian GoreTex stitch  . TONSILLECTOMY       Allergies:   Allergies  Allergen Reactions  . Levofloxacin Nausea And Vomiting    Other reaction(s): Confusion  . Gabapentin Other (See Comments)    Disoriented, no strength in legs     Social History:  reports that she has never smoked. She has never used smokeless tobacco. She reports that she does not drink alcohol or use drugs.   Family History: Family History  Problem Relation Age of Onset  . Chronic Renal Failure Mother   . Diabetes Father   . Heart attack Father 37    Physical Exam: Vitals:   10/06/16 1221 10/06/16 1346 10/06/16 1601  BP: 125/69 95/79 123/73  Pulse: 76 86 68  Resp: (!) 21 19   Temp: 98.1 F (36.7 C)    TempSrc: Oral    SpO2: 95% 94%   Weight: 66.8 kg (147 lb 4.3 oz)    Height: 5\' 6"  (1.676 m)      Constitutional:  Alert and awake, oriented x3, not in any acute distress. Eyes: PERLA, EOMI, irises appear normal, anicteric sclera,  Neck: neck appears normal, no masses, normal ROM, no thyromegaly, no JVD  CVS: S1-S2 clear, no murmur rubs or gallops, no LE edema, normal pedal pulses  Respiratory:  clear to auscultation bilaterally,  no wheezing, rales or rhonchi Abdomen: soft nontender, nondistended, normal bowel sounds, no hepatosplenomegaly, no hernias  Musculoskeletal: : no cyanosis, clubbing or edema noted bilaterally Neuro: Cranial nerves II-XII intact, strength, sensation, reflexes Psych: judgement and insight appear normal, stable mood and affect, mental status Skin: no rashes or lesions or ulcers, no induration or nodules    Data reviewed:  I have personally reviewed following labs and imaging studies Labs:  CBC: No results for input(s): WBC, NEUTROABS, HGB, HCT, MCV, PLT in the last 168 hours.  Basic Metabolic Panel:  Recent Labs Lab 10/06/16 1517  MG 2.1   GFR CrCl cannot be calculated (Patient's most recent lab result is older than the maximum 21 days  allowed.). Liver Function Tests: No results for input(s): AST, ALT, ALKPHOS, BILITOT, PROT, ALBUMIN in the last 168 hours. No results for input(s): LIPASE, AMYLASE in the last 168 hours. No results for input(s): AMMONIA in the last 168 hours. Coagulation profile No results for input(s): INR, PROTIME in the last 168 hours.  Cardiac Enzymes:  Recent Labs Lab 10/06/16 1517  TROPONINI 0.83*   BNP: Invalid input(s): POCBNP CBG: No results for input(s): GLUCAP in the last 168 hours. D-Dimer No results for input(s): DDIMER in the last 72 hours. Hgb A1c No results for input(s): HGBA1C in the last 72 hours. Lipid Profile No results for input(s): CHOL, HDL, LDLCALC, TRIG, CHOLHDL, LDLDIRECT in the last 72 hours. Thyroid function studies  Recent Labs  10/06/16 1517  TSH 5.876*   Anemia work up No results for input(s): VITAMINB12, FOLATE, FERRITIN, TIBC, IRON, RETICCTPCT in the last 72 hours. Urinalysis    Component Value Date/Time   COLORURINE YELLOW 07/29/2016 1247   APPEARANCEUR HAZY (A) 07/29/2016 1247   LABSPEC 1.016 07/29/2016 1247   PHURINE 5.0 07/29/2016 1247   GLUCOSEU >=500 (A) 07/29/2016 1247   HGBUR NEGATIVE 07/29/2016 1247   BILIRUBINUR NEGATIVE 07/29/2016 1247   KETONESUR NEGATIVE 07/29/2016 1247   PROTEINUR >=300 (A) 07/29/2016 1247   UROBILINOGEN 0.2 03/03/2007 1337   NITRITE NEGATIVE 07/29/2016 1247   LEUKOCYTESUR NEGATIVE 07/29/2016 1247     Microbiology No results found for this or any previous visit (from the past 240 hour(s)).     Inpatient Medications:   Scheduled Meds: . amLODipine  5 mg Oral Daily  . aspirin  81 mg Oral Daily  . atorvastatin  40 mg Oral q1800  . docusate sodium  100 mg Oral BID  . furosemide  40 mg Intravenous Q12H  . insulin aspart  0-5 Units Subcutaneous QHS  . insulin aspart  0-9 Units Subcutaneous TID WC  . insulin glargine  6 Units Subcutaneous QHS  . mouth rinse  15 mL Mouth Rinse BID  . metoprolol tartrate  12.5  mg Oral BID  . nitroGLYCERIN  0.5 inch Topical Q6H  . pantoprazole  40 mg Oral Daily   Continuous Infusions: . sodium chloride 10 mL/hr at 10/06/16 1603  . heparin 800 Units/hr (10/06/16 1551)     Radiological Exams on Admission: No results found.  Impression/Recommendations Principal Problem:  DM on Insulin -continue lantus 12 units daily and SSI -check HBa1c -titrate insulin as needed  Anemia- -suspect multifactorial -from CKD primarily, clinically volume overloaded on exam too, so some dilutional component possible too -check anemia panel -hemoccult stool -depending on anemia panel will be able to order Epo or IV Iron as needed -was getting Aranesp shots from Dr.Quincy Lewis's office -uptodate on colonoscopies -do not suspect GI loss but agree  with hemoccult for now  CKD 3 -stable, baseline 1.5-1.8 -montior with diuresis, recently was up to 2  NSTEMI/CHF -per Cards -lasix/heparin/ plan for LHC depending on clinical progress and Creatinine  Thank you for this consultation.  Our [facility] hospitalist team will follow the patient with you.   Time Spent:  Kenzli Barritt M.D. [facility] 10/06/2016, 4:50 PM

## 2016-10-07 DIAGNOSIS — R0602 Shortness of breath: Secondary | ICD-10-CM

## 2016-10-07 DIAGNOSIS — L97519 Non-pressure chronic ulcer of other part of right foot with unspecified severity: Secondary | ICD-10-CM

## 2016-10-07 DIAGNOSIS — I350 Nonrheumatic aortic (valve) stenosis: Secondary | ICD-10-CM

## 2016-10-07 DIAGNOSIS — I200 Unstable angina: Secondary | ICD-10-CM

## 2016-10-07 DIAGNOSIS — E11621 Type 2 diabetes mellitus with foot ulcer: Secondary | ICD-10-CM

## 2016-10-07 LAB — BASIC METABOLIC PANEL
Anion gap: 10 (ref 5–15)
Anion gap: 12 (ref 5–15)
BUN: 39 mg/dL — ABNORMAL HIGH (ref 6–20)
BUN: 42 mg/dL — ABNORMAL HIGH (ref 6–20)
CO2: 17 mmol/L — ABNORMAL LOW (ref 22–32)
CO2: 16 mmol/L — ABNORMAL LOW (ref 22–32)
Calcium: 8.6 mg/dL — ABNORMAL LOW (ref 8.9–10.3)
Calcium: 8.5 mg/dL — ABNORMAL LOW (ref 8.9–10.3)
Chloride: 109 mmol/L (ref 101–111)
Chloride: 105 mmol/L (ref 101–111)
Creatinine, Ser: 1.85 mg/dL — ABNORMAL HIGH (ref 0.44–1.00)
Creatinine, Ser: 2.01 mg/dL — ABNORMAL HIGH (ref 0.44–1.00)
GFR calc Af Amer: 30 mL/min — ABNORMAL LOW (ref >60)
GFR calc Af Amer: 27 mL/min — ABNORMAL LOW (ref >60)
GFR calc non Af Amer: 26 mL/min — ABNORMAL LOW (ref >60)
GFR calc non Af Amer: 23 mL/min — ABNORMAL LOW (ref >60)
Glucose, Bld: 154 mg/dL — ABNORMAL HIGH (ref 65–99)
Glucose, Bld: 243 mg/dL — ABNORMAL HIGH (ref 65–99)
Potassium: 4.4 mmol/L (ref 3.5–5.1)
Potassium: 4.5 mmol/L (ref 3.5–5.1)
Sodium: 136 mmol/L (ref 135–145)
Sodium: 133 mmol/L — ABNORMAL LOW (ref 135–145)

## 2016-10-07 LAB — CBC
HCT: 24.1 % — ABNORMAL LOW (ref 36.0–46.0)
HCT: 26.4 % — ABNORMAL LOW (ref 36.0–46.0)
Hemoglobin: 7.1 g/dL — ABNORMAL LOW (ref 12.0–15.0)
Hemoglobin: 8.1 g/dL — ABNORMAL LOW (ref 12.0–15.0)
MCH: 23.4 pg — ABNORMAL LOW (ref 26.0–34.0)
MCH: 24.7 pg — ABNORMAL LOW (ref 26.0–34.0)
MCHC: 29.5 g/dL — ABNORMAL LOW (ref 30.0–36.0)
MCHC: 30.7 g/dL (ref 30.0–36.0)
MCV: 79.3 fL (ref 78.0–100.0)
MCV: 80.5 fL (ref 78.0–100.0)
Platelets: 573 10*3/uL — ABNORMAL HIGH (ref 150–400)
Platelets: 541 10*3/uL — ABNORMAL HIGH (ref 150–400)
RBC: 3.04 MIL/uL — ABNORMAL LOW (ref 3.87–5.11)
RBC: 3.28 MIL/uL — ABNORMAL LOW (ref 3.87–5.11)
RDW: 19.4 % — ABNORMAL HIGH (ref 11.5–15.5)
RDW: 18.9 % — ABNORMAL HIGH (ref 11.5–15.5)
WBC: 8.9 10*3/uL (ref 4.0–10.5)
WBC: 8.5 10*3/uL (ref 4.0–10.5)

## 2016-10-07 LAB — LIPID PANEL
Cholesterol: 125 mg/dL (ref 0–200)
HDL: 34 mg/dL — ABNORMAL LOW (ref >40)
LDL Cholesterol: 72 mg/dL (ref 0–99)
Total CHOL/HDL Ratio: 3.7 RATIO
Triglycerides: 97 mg/dL (ref <150)
VLDL: 19 mg/dL (ref 0–40)

## 2016-10-07 LAB — GLUCOSE, CAPILLARY
Glucose-Capillary: 121 mg/dL — ABNORMAL HIGH (ref 65–99)
Glucose-Capillary: 130 mg/dL — ABNORMAL HIGH (ref 65–99)
Glucose-Capillary: 135 mg/dL — ABNORMAL HIGH (ref 65–99)
Glucose-Capillary: 244 mg/dL — ABNORMAL HIGH (ref 65–99)

## 2016-10-07 LAB — HEPARIN LEVEL (UNFRACTIONATED)
Heparin Unfractionated: 0.1 IU/mL — ABNORMAL LOW (ref 0.30–0.70)
Heparin Unfractionated: 0.12 IU/mL — ABNORMAL LOW (ref 0.30–0.70)
Heparin Unfractionated: 0.35 IU/mL (ref 0.30–0.70)

## 2016-10-07 LAB — PROTIME-INR
INR: 1.19
Prothrombin Time: 15.2 seconds (ref 11.4–15.2)

## 2016-10-07 LAB — PREPARE RBC (CROSSMATCH)

## 2016-10-07 LAB — TROPONIN I: Troponin I: 0.79 ng/mL (ref <0.03)

## 2016-10-07 MED ORDER — SODIUM CHLORIDE 0.9 % IV SOLN
Freq: Once | INTRAVENOUS | Status: AC
  Administered 2016-10-07: 10 mL/h via INTRAVENOUS

## 2016-10-07 MED ORDER — FUROSEMIDE 10 MG/ML IJ SOLN
20.0000 mg | Freq: Once | INTRAMUSCULAR | Status: AC
  Administered 2016-10-07: 20 mg via INTRAVENOUS
  Filled 2016-10-07: qty 2

## 2016-10-07 MED ORDER — SODIUM CHLORIDE 0.9% FLUSH
3.0000 mL | Freq: Two times a day (BID) | INTRAVENOUS | Status: DC
  Administered 2016-10-08 – 2016-10-09 (×2): 3 mL via INTRAVENOUS

## 2016-10-07 MED ORDER — GABAPENTIN 300 MG PO CAPS
300.0000 mg | ORAL_CAPSULE | Freq: Every day | ORAL | Status: DC
  Filled 2016-10-07: qty 1

## 2016-10-07 MED ORDER — SODIUM CHLORIDE 0.9 % IV SOLN: 250.0000 mL | INTRAVENOUS | Status: DC | PRN

## 2016-10-07 MED ORDER — GABAPENTIN 600 MG PO TABS: 300.0000 mg | ORAL_TABLET | Freq: Every day | ORAL | Status: DC

## 2016-10-07 MED ORDER — SODIUM CHLORIDE 0.9% FLUSH: 3.0000 mL | INTRAVENOUS | Status: DC | PRN

## 2016-10-07 MED ORDER — SODIUM CHLORIDE 0.9 % IV SOLN
INTRAVENOUS | Status: DC
  Administered 2016-10-08: 06:00:00 via INTRAVENOUS

## 2016-10-07 MED ORDER — ISOSORBIDE MONONITRATE ER 30 MG PO TB24
15.0000 mg | ORAL_TABLET | Freq: Every day | ORAL | Status: DC
  Administered 2016-10-07 – 2016-10-10 (×4): 15 mg via ORAL
  Filled 2016-10-07 (×4): qty 1

## 2016-10-07 MED ORDER — HEPARIN BOLUS VIA INFUSION
1500.0000 [IU] | Freq: Once | INTRAVENOUS | Status: AC
  Administered 2016-10-07: 1500 [IU] via INTRAVENOUS
  Filled 2016-10-07: qty 1500

## 2016-10-07 MED ORDER — HEPARIN BOLUS VIA INFUSION
1000.0000 [IU] | Freq: Once | INTRAVENOUS | Status: AC
  Administered 2016-10-07: 1000 [IU] via INTRAVENOUS
  Filled 2016-10-07: qty 1000

## 2016-10-07 MED ORDER — ASPIRIN 81 MG PO CHEW: 81.0000 mg | CHEWABLE_TABLET | ORAL | Status: AC

## 2016-10-07 MED ORDER — CYCLOBENZAPRINE HCL 10 MG PO TABS
5.0000 mg | ORAL_TABLET | Freq: Three times a day (TID) | ORAL | Status: DC
  Administered 2016-10-07 – 2016-10-16 (×26): 5 mg via ORAL
  Filled 2016-10-07 (×27): qty 1

## 2016-10-07 NOTE — Progress Notes (Signed)
Progress Note  Patient Name: Emily Miranda Date of Encounter: 10/07/2016  Primary Cardiologist: Hochrein  Subjective   76 year old frail Caucasian female admitted in transfer from [facility] hospital with unstable angina. She currently is on IV heparin drip. She has complained of chest pain overnight with radiation to her jaw. She is on a calcium channel blocker and a beta blocker.  Inpatient Medications    Scheduled Meds: . amLODipine  5 mg Oral Daily  . aspirin  81 mg Oral Daily  . atorvastatin  40 mg Oral q1800  . docusate sodium  100 mg Oral BID  . furosemide  40 mg Intravenous Q12H  . insulin aspart  0-5 Units Subcutaneous QHS  . insulin aspart  0-9 Units Subcutaneous TID WC  . insulin glargine  6 Units Subcutaneous QHS  . mouth rinse  15 mL Mouth Rinse BID  . metoprolol tartrate  12.5 mg Oral BID  . nitroGLYCERIN  0.5 inch Topical Q6H  . pantoprazole  40 mg Oral Daily   Continuous Infusions: . sodium chloride 10 mL/hr at 10/06/16 1603  . heparin 1,100 Units/hr (10/07/16 0400)   PRN Meds: acetaminophen, ALPRAZolam, artificial tears, fluticasone, nitroGLYCERIN, ondansetron (ZOFRAN) IV, polyethylene glycol, traMADol   Vital Signs    Vitals:   10/06/16 1601 10/06/16 2130 10/06/16 2309 10/07/16 0348  BP: 123/73 131/69 125/68 132/68  Pulse: 68 86 89 83  Resp:  20  (!) 21  Temp:  97.8 F (36.6 C)  97.6 F (36.4 C)  TempSrc:  Oral  Oral  SpO2:  91%  94%  Weight:    146 lb 11.2 oz (66.5 kg)  Height:        Intake/Output Summary (Last 24 hours) at 10/07/16 0804 Last data filed at 10/07/16 0400  Gross per 24 hour  Intake           354.35 ml  Output                0 ml  Net           354.35 ml   Filed Weights   10/06/16 1221 10/07/16 0348  Weight: 147 lb 4.3 oz (66.8 kg) 146 lb 11.2 oz (66.5 kg)    Telemetry    Sinus rhythm - Personally Reviewed  ECG    Normal sinus rhythm at 80 without ST or T-wave changes - Personally Reviewed  Physical Exam    GEN: No acute distress.   Neck: No JVD Cardiac: RRR, Soft outflow tract murmur consistent with aortic stenosis, rubs, or gallops.  Respiratory: Clear to auscultation bilaterally. GI: Soft, nontender, non-distended  MS: No edema; No deformity. Neuro:  Nonfocal  Psych: Normal affect  Extremities-2+ pitting edema  Labs    Chemistry Recent Labs Lab 10/07/16 0233  NA 136  K 4.4  CL 109  CO2 17*  GLUCOSE 154*  BUN 39*  CREATININE 1.85*  CALCIUM 8.6*  GFRNONAA 26*  GFRAA 30*  ANIONGAP 10     Hematology Recent Labs Lab 10/06/16 1639 10/07/16 0233  WBC  --  8.9  RBC 2.96* 3.04*  HGB  --  7.1*  HCT  --  24.1*  MCV  --  79.3  MCH  --  23.4*  MCHC  --  29.5*  RDW  --  19.4*  PLT  --  573*    Cardiac Enzymes Recent Labs Lab 10/06/16 1517 10/06/16 2031 10/07/16 0233  TROPONINI 0.83* 0.79* 0.79*   No results for input(s): TROPIPOC  in the last 168 hours.   BNPNo results for input(s): BNP, PROBNP in the last 168 hours.   DDimer No results for input(s): DDIMER in the last 168 hours.   Radiology    Dg Chest Port 1 View  Result Date: 10/06/2016 CLINICAL DATA:  Shortness of breath with left-sided chest pain. History of hypertension, diabetes and congestive heart failure. EXAM: PORTABLE CHEST 1 VIEW COMPARISON:  Radiographs 10/06/2016.  CT 08/01/2016. FINDINGS: 1655 hour. The heart size and mediastinal contours are stable. There are small bilateral pleural effusions with mild bibasilar atelectasis, vascular congestion and probable mild edema. No confluent airspace opacity or pneumothorax. No acute osseous findings are seen. Telemetry leads overlie the chest. IMPRESSION: Probable mild congestive heart failure with mild edema and small bilateral pleural effusions. Electronically Signed   By: Carey Bullocks M.D.   On: 10/06/2016 17:03    Cardiac Studies   2D ECHO (07/30/16)  Study Conclusions  - Left ventricle: The cavity size was normal. Wall thickness was    normal. Systolic function was mildly to moderately reduced. The   estimated ejection fraction was in the range of 40% to 45%.   Akinesis of the midanteroseptal myocardium. Features are   consistent with a pseudonormal left ventricular filling pattern,   with concomitant abnormal relaxation and increased filling   pressure (grade 2 diastolic dysfunction). - Aortic valve: Mildly to moderately calcified annulus. Mildly   thickened, mildly calcified leaflets. There was mild stenosis.   There was trivial regurgitation. Valve area (VTI): 1.31 cm^2.   Valve area (Vmax): 1.28 cm^2. Valve area (Vmean): 1.26 cm^2. - Mitral valve: There was mild regurgitation. - Left atrium: The atrium was moderately dilated. - Pericardium, extracardiac: A trivial pericardial effusion was   identified. There was a left pleural effusion.  Patient Profile     76 y.o. female with history of mild to moderate aortic stenosis, hypertension, diabetes and mixed systolic and diastolic heart failure. She also has moderate renal insufficiency. She was admitted with unstable angina. Her enzymes are low and flat. She is on IV heparin. Her hemoglobin is 7.1. She'll need coronary angiography.  Assessment & Plan    1: Ischemic heart disease-patient has lower flat enzymes but accelerated angina. The complicating issues are her hemoglobin and serum creatinine. I'm going to transfuse her one unit of packed red blood cells. She is getting Lasix. We will continue to monitor her CBC and renal function.  2: Systolic heart failure-her last 2-D echo performed in March revealed an EF of 40-45% with grade 2 diastolic dysfunction. Her BNP on admission was 409. She is on oral diuretics with a serum creatinine of 1.8. Her I/oh remains positive at 300 mL. We'll continue to gently diurese her and cognizant of her renal failure and need for coronary angiography.  Alphonsus Sias, MD  10/07/2016, 8:04 AM

## 2016-10-07 NOTE — Progress Notes (Signed)
ANTICOAGULATION CONSULT NOTE   Pharmacy Consult for heparin Indication: chest pain/ACS  Allergies  Allergen Reactions  . Levofloxacin Nausea And Vomiting    Other reaction(s): Confusion  . Gabapentin Other (See Comments)    Disoriented, no strength in legs    Patient Measurements: Height: 5\' 6"  (167.6 cm) Weight: 146 lb 11.2 oz (66.5 kg) IBW/kg (Calculated) : 59.3 Heparin Dosing Weight: 66.8 kg  Vital Signs: Temp: 97.6 F (36.4 C) (05/28 0348) Temp Source: Oral (05/28 0348) BP: 128/79 (05/28 0837) Pulse Rate: 86 (05/28 0837)  Labs:  Recent Labs  10/06/16 1517 10/06/16 2031 10/06/16 2338 10/07/16 0233 10/07/16 1006  HGB  --   --   --  7.1*  --   HCT  --   --   --  24.1*  --   PLT  --   --   --  573*  --   HEPARINUNFRC  --   --  0.10*  --  0.12*  CREATININE  --   --   --  1.85*  --   TROPONINI 0.83* 0.79*  --  0.79*  --     Estimated Creatinine Clearance: 25 mL/min (A) (by C-G formula based on SCr of 1.85 mg/dL (H)).   Medical History: Past Medical History:  Diagnosis Date  . Aortic stenosis, mild   . Charcot foot due to diabetes mellitus (HCC)   . CHF (congestive heart failure) (HCC)   . Diabetes mellitus   . HTN (hypertension)   . Hyperlipidemia   . Neuropathy   . Pancreatitis     Assessment: 13 yoF presents from OSH with crushing CP to start on heparin per pharmacy for ACS r/o. Initial heparin level subtherapeutic and repeat remains subtherapeutic after bolus and infusion increase at 0.12. Per RN IV line was changed early morning but has been running without issues since. Hgb down to 7.1 but no overt S/Sx bleeding noted per RN and pt to receive pRBCs and obtain FOBT. Cardiology aware and desires to continue heparin with plans for cardiac cath once renal function improves.  Goal of Therapy:  Heparin level 0.3-0.7 units/ml Monitor platelets by anticoagulation protocol: Yes   Plan:  -Heparin 1000 unit bolus x1 -Increase Heparin infusion to 1300  units/hr  -Check 8-hr heparin level -Monitor heparin level, CBC, S/Sx bleeding daily  Fredonia Highland, PharmD PGY-1 Pharmacy Resident Pager: [phone] 10/07/2016

## 2016-10-07 NOTE — Progress Notes (Signed)
ANTICOAGULATION CONSULT NOTE   Pharmacy Consult for heparin Indication: chest pain/ACS  Allergies  Allergen Reactions  . Levofloxacin Nausea And Vomiting    Other reaction(s): Confusion  . Gabapentin Other (See Comments)    Disoriented, no strength in legs    Patient Measurements: Height: 5\' 6"  (167.6 cm) Weight: 147 lb 4.3 oz (66.8 kg) IBW/kg (Calculated) : 59.3 Heparin Dosing Weight: 66.8 kg  Vital Signs: Temp: 97.8 F (36.6 C) (05/27 2130) Temp Source: Oral (05/27 2130) BP: 125/68 (05/27 2309) Pulse Rate: 89 (05/27 2309)  Labs:  Recent Labs  10/06/16 1517 10/06/16 2031 10/06/16 2338  HEPARINUNFRC  --   --  0.10*  TROPONINI 0.83* 0.79*  --     CrCl cannot be calculated (Patient's most recent lab result is older than the maximum 21 days allowed.).   Medical History: Past Medical History:  Diagnosis Date  . Aortic stenosis, mild   . Charcot foot due to diabetes mellitus (HCC)   . CHF (congestive heart failure) (HCC)   . Diabetes mellitus   . HTN (hypertension)   . Hyperlipidemia   . Neuropathy   . Pancreatitis     Assessment: 58 yoF presents from OSH with crushing CP to start on heparin per pharmacy for ACS r/o. Pt has no OAC noted on PTA list and does not appear to have received heparin at OSH. Baseline CBC not available yet. Plan per cards is to treat medically and consider cardiac cath Tuesday.  Initial heparin level is subtherapeutic at 0.10. No issues with infusion per RN.   Goal of Therapy:  Heparin level 0.3-0.7 units/ml Monitor platelets by anticoagulation protocol: Yes   Plan:  -Heparin 1500 unit bolus -Increase Heparin infusion to 1100 units/hr  -Check 8-hr heparin level -Monitor heparin level, CBC, S/Sx bleeding daily  Pollyann Samples, PharmD, BCPS 10/07/2016, 12:54 AM

## 2016-10-07 NOTE — Progress Notes (Signed)
ANTICOAGULATION CONSULT NOTE - Follow Up Consult  Pharmacy Consult for Heparin Indication: chest pain/ACS  Allergies  Allergen Reactions  . Levofloxacin Nausea And Vomiting    Other reaction(s): Confusion  . Gabapentin Other (See Comments)    Disoriented, no strength in legs    Patient Measurements: Height: 5\' 6"  (167.6 cm) Weight: 146 lb 11.2 oz (66.5 kg) IBW/kg (Calculated) : 59.3   Vital Signs: Temp: 98.2 F (36.8 C) (05/28 1835) Temp Source: Oral (05/28 1835) BP: 123/76 (05/28 1835) Pulse Rate: 97 (05/28 1528)  Labs:  Recent Labs  10/06/16 1517 10/06/16 2031 10/06/16 2338 10/07/16 0233 10/07/16 1006 10/07/16 2010 10/07/16 2026  HGB  --   --   --  7.1*  --  8.1*  --   HCT  --   --   --  24.1*  --  26.4*  --   PLT  --   --   --  573*  --  541*  --   HEPARINUNFRC  --   --  0.10*  --  0.12*  --  0.35  CREATININE  --   --   --  1.85*  --   --   --   TROPONINI 0.83* 0.79*  --  0.79*  --   --   --     Estimated Creatinine Clearance: 25 mL/min (A) (by C-G formula based on SCr of 1.85 mg/dL (H)).   Medications:  Heparin @ 1300 units/hr  Assessment: 75yof continues on heparin for chest pain with plan for cath once renal function improves. Heparin level is now therapeutic at 0.35. No bleeding.  Goal of Therapy:  Heparin level 0.3-0.7 units/ml Monitor platelets by anticoagulation protocol: Yes   Plan:  1) Continue heparin at 1300 units/hr 2) Follow up daily heparin level and CBC  Fredrik Rigger 10/07/2016,8:59 PM

## 2016-10-07 NOTE — Progress Notes (Signed)
[facility] PROGRESS NOTE  Emily Miranda WJX:[id number] DOB: [birth year] DOA: 10/06/2016   PCP: Noni Saupe, MD     Assessment/Plan: Principal Problem:   Non-ST elevation (NSTEMI) myocardial infarction [facility]) Active Problems:   DM type 2 causing CKD stage 3 (HCC)   Mild aortic stenosis   CHEST PAIN   HTN (hypertension)   Shortness of breath   Acute combined systolic and diastolic congestive heart failure (HCC)   Chronic renal insufficiency, stage 3 (moderate)   Chronic diabetic ulcer of right foot    NSTEMI (non-ST elevated myocardial infarction) (HCC)   76 y.o. female with past history of diastolic CHF, moderate aortic stenosis, hypertension, diabetes recently started on insulin, chronic anemia, neuropathy, chronic kidney disease with baseline creatinine of 1.5-1.8 admitted today from [facility] emergency room for  NSTEMI and CHF. [facility] consulted for evaluation of anemia and diabetes on insulin. Patient reports having some kidney disease for a few years now is not followed by a nephrologist. She has also been noted to have anemia for several months, few months ago she saw a hematologist in [place] Dr. Jola Babinski and was subsequently started on Aranesp injections, of which she has had 2 or 3 in the recent past. She denies any blood in her stools or black stools, she is up-to-date on colonoscopies and her last one was about 3 years ago, which was reportedly normal.    Assessment and plan DM on Insulin, Accu-Chek stable -continue lantus 12 units daily and SSI Hemoglobin A1c pending  Anemia-baseline around 9 -suspect multifactorial -from CKD primarily, clinically volume overloaded on exam too, so some dilutional component possible too Anemia panel consistent with anemia of chronic disease -hemoccult stool pending -depending on anemia panel will be able to order Epo or IV Iron as needed -was getting Aranesp shots from Dr.Quincy Lewis's office -uptodate on  colonoscopies Continue PPI for GI prophylaxis Patient receiving 1 unit of packed red blood cells given that hemoglobin has dropped to 7.1 without any reported bleeding  CKD 3 -stable, baseline 1.5-1.8 -montior with diuresis, recently was up to 2  NSTEMI/CHF-EF 40-45% -per Cards -lasix/heparin/ plan for LHC depending on clinical progress and Creatinine   Leg cramps  Has a hx of neuropathy Started flexeril for leg cramps  Resume gabapentin   DVT prophylaxsis heparin  Code Status:  Full code    Family Communication: Discussed in detail with the patient, all imaging results, lab results explained to the patient   Disposition Plan:   As per cardiology      Consultants:  Cardiology, [facility]  Procedures:  None  Antibiotics: Anti-infectives    None         HPI/Subjective: Leg cramps,.sob  Objective: Vitals:   10/06/16 2130 10/06/16 2309 10/07/16 0348 10/07/16 0837  BP: 131/69 125/68 132/68 128/79  Pulse: 86 89 83 86  Resp: 20  (!) 21   Temp: 97.8 F (36.6 C)  97.6 F (36.4 C)   TempSrc: Oral  Oral   SpO2: 91%  94%   Weight:   66.5 kg (146 lb 11.2 oz)   Height:        Intake/Output Summary (Last 24 hours) at 10/07/16 0904 Last data filed at 10/07/16 0400  Gross per 24 hour  Intake           354.35 ml  Output                0 ml  Net  354.35 ml    Exam:  Examination:  General exam: Appears calm and comfortable  Respiratory system: Clear to auscultation. Respiratory effort normal. Cardiovascular system: S1 & S2 heard, RRR. No JVD, murmurs, rubs, gallops or clicks. No pedal edema. Gastrointestinal system: Abdomen is nondistended, soft and nontender. No organomegaly or masses felt. Normal bowel sounds heard. Central nervous system: Alert and oriented. No focal neurological deficits. Extremities: Symmetric 5 x 5 power. Skin: No rashes, lesions or ulcers Psychiatry: Judgement and insight appear normal. Mood & affect appropriate.      Data Reviewed: I have personally reviewed following labs and imaging studies  Micro Results No results found for this or any previous visit (from the past 240 hour(s)).  Radiology Reports Dg Chest Port 1 View  Result Date: 10/06/2016 CLINICAL DATA:  Shortness of breath with left-sided chest pain. History of hypertension, diabetes and congestive heart failure. EXAM: PORTABLE CHEST 1 VIEW COMPARISON:  Radiographs 10/06/2016.  CT 08/01/2016. FINDINGS: 1655 hour. The heart size and mediastinal contours are stable. There are small bilateral pleural effusions with mild bibasilar atelectasis, vascular congestion and probable mild edema. No confluent airspace opacity or pneumothorax. No acute osseous findings are seen. Telemetry leads overlie the chest. IMPRESSION: Probable mild congestive heart failure with mild edema and small bilateral pleural effusions. Electronically Signed   By: Carey Bullocks M.D.   On: 10/06/2016 17:03     CBC  Recent Labs Lab 10/07/16 0233  WBC 8.9  HGB 7.1*  HCT 24.1*  PLT 573*  MCV 79.3  MCH 23.4*  MCHC 29.5*  RDW 19.4*    Chemistries   Recent Labs Lab 10/06/16 1517 10/07/16 0233  NA  --  136  K  --  4.4  CL  --  109  CO2  --  17*  GLUCOSE  --  154*  BUN  --  39*  CREATININE  --  1.85*  CALCIUM  --  8.6*  MG 2.1  --    ------------------------------------------------------------------------------------------------------------------ estimated creatinine clearance is 25 mL/min (A) (by C-G formula based on SCr of 1.85 mg/dL (H)). ------------------------------------------------------------------------------------------------------------------ No results for input(s): HGBA1C in the last 72 hours. ------------------------------------------------------------------------------------------------------------------  Recent Labs  10/07/16 0233  CHOL 125  HDL 34*  LDLCALC 72  TRIG 97  CHOLHDL 3.7    ------------------------------------------------------------------------------------------------------------------  Recent Labs  10/06/16 1517  TSH 5.876*   ------------------------------------------------------------------------------------------------------------------  Recent Labs  10/06/16 1639 10/06/16 1640  VITAMINB12 920*  --   FOLATE  --  25.9  FERRITIN 127  --   TIBC 213*  --   IRON 23*  --   RETICCTPCT 2.7  --     Coagulation profile No results for input(s): INR, PROTIME in the last 168 hours.  No results for input(s): DDIMER in the last 72 hours.  Cardiac Enzymes  Recent Labs Lab 10/06/16 1517 10/06/16 2031 10/07/16 0233  TROPONINI 0.83* 0.79* 0.79*   ------------------------------------------------------------------------------------------------------------------ Invalid input(s): POCBNP   CBG:  Recent Labs Lab 10/06/16 1704 10/06/16 2111 10/07/16 0748  GLUCAP 187* 207* 121*       Studies: Dg Chest Port 1 View  Result Date: 10/06/2016 CLINICAL DATA:  Shortness of breath with left-sided chest pain. History of hypertension, diabetes and congestive heart failure. EXAM: PORTABLE CHEST 1 VIEW COMPARISON:  Radiographs 10/06/2016.  CT 08/01/2016. FINDINGS: 1655 hour. The heart size and mediastinal contours are stable. There are small bilateral pleural effusions with mild bibasilar atelectasis, vascular congestion and probable mild edema. No confluent airspace opacity or pneumothorax.  No acute osseous findings are seen. Telemetry leads overlie the chest. IMPRESSION: Probable mild congestive heart failure with mild edema and small bilateral pleural effusions. Electronically Signed   By: Carey Bullocks M.D.   On: 10/06/2016 17:03      Lab Results  Component Value Date   HGBA1C 12.5 (H) 07/29/2016   Lab Results  Component Value Date   LDLCALC 72 10/07/2016   CREATININE 1.85 (H) 10/07/2016       Scheduled Meds: . amLODipine  5 mg Oral  Daily  . aspirin  81 mg Oral Daily  . atorvastatin  40 mg Oral q1800  . docusate sodium  100 mg Oral BID  . furosemide  20 mg Intravenous Once  . furosemide  40 mg Intravenous Q12H  . insulin aspart  0-5 Units Subcutaneous QHS  . insulin aspart  0-9 Units Subcutaneous TID WC  . insulin glargine  6 Units Subcutaneous QHS  . isosorbide mononitrate  15 mg Oral Daily  . mouth rinse  15 mL Mouth Rinse BID  . metoprolol tartrate  12.5 mg Oral BID  . nitroGLYCERIN  0.5 inch Topical Q6H  . pantoprazole  40 mg Oral Daily   Continuous Infusions: . sodium chloride 10 mL/hr at 10/06/16 1603  . sodium chloride    . heparin 1,100 Units/hr (10/07/16 0400)     LOS: 1 day    Time spent: >30 MINS    Richarda Overlie  [facility] Pager [phone]. If 7PM-7AM, please contact night-coverage at [email or web address], password [facility] 10/07/2016, 9:04 AM  LOS: 1 day

## 2016-10-07 NOTE — Plan of Care (Signed)
Problem: Safety: Goal: Ability to remain free from injury will improve Outcome: Progressing Patient uses the call bell to call for assistance, bed alarm in the evening and when family not present.

## 2016-10-08 ENCOUNTER — Telehealth: Payer: Self-pay | Admitting: Sports Medicine

## 2016-10-08 ENCOUNTER — Ambulatory Visit: Payer: Medicare Other | Admitting: Cardiology

## 2016-10-08 LAB — GLUCOSE, CAPILLARY
Glucose-Capillary: 133 mg/dL — ABNORMAL HIGH (ref 65–99)
Glucose-Capillary: 143 mg/dL — ABNORMAL HIGH (ref 65–99)
Glucose-Capillary: 204 mg/dL — ABNORMAL HIGH (ref 65–99)
Glucose-Capillary: 233 mg/dL — ABNORMAL HIGH (ref 65–99)

## 2016-10-08 LAB — CBC
HCT: 27 % — ABNORMAL LOW (ref 36.0–46.0)
Hemoglobin: 8.2 g/dL — ABNORMAL LOW (ref 12.0–15.0)
MCH: 24.7 pg — ABNORMAL LOW (ref 26.0–34.0)
MCHC: 30.4 g/dL (ref 30.0–36.0)
MCV: 81.3 fL (ref 78.0–100.0)
Platelets: 513 10*3/uL — ABNORMAL HIGH (ref 150–400)
RBC: 3.32 MIL/uL — ABNORMAL LOW (ref 3.87–5.11)
RDW: 19.2 % — ABNORMAL HIGH (ref 11.5–15.5)
WBC: 7.7 10*3/uL (ref 4.0–10.5)

## 2016-10-08 LAB — COMPREHENSIVE METABOLIC PANEL
ALT: 11 U/L — ABNORMAL LOW (ref 14–54)
AST: 14 U/L — ABNORMAL LOW (ref 15–41)
Albumin: 2 g/dL — ABNORMAL LOW (ref 3.5–5.0)
Alkaline Phosphatase: 90 U/L (ref 38–126)
Anion gap: 12 (ref 5–15)
BUN: 41 mg/dL — ABNORMAL HIGH (ref 6–20)
CO2: 15 mmol/L — ABNORMAL LOW (ref 22–32)
Calcium: 8.8 mg/dL — ABNORMAL LOW (ref 8.9–10.3)
Chloride: 109 mmol/L (ref 101–111)
Creatinine, Ser: 1.86 mg/dL — ABNORMAL HIGH (ref 0.44–1.00)
GFR calc Af Amer: 30 mL/min — ABNORMAL LOW (ref >60)
GFR calc non Af Amer: 26 mL/min — ABNORMAL LOW (ref >60)
Glucose, Bld: 149 mg/dL — ABNORMAL HIGH (ref 65–99)
Potassium: 4.3 mmol/L (ref 3.5–5.1)
Sodium: 136 mmol/L (ref 135–145)
Total Bilirubin: 0.4 mg/dL (ref 0.3–1.2)
Total Protein: 6.5 g/dL (ref 6.5–8.1)

## 2016-10-08 LAB — HEMOGLOBIN A1C
Hgb A1c MFr Bld: 11.3 % — ABNORMAL HIGH (ref 4.8–5.6)
Mean Plasma Glucose: 278 mg/dL

## 2016-10-08 LAB — HEPARIN LEVEL (UNFRACTIONATED): Heparin Unfractionated: 0.33 IU/mL (ref 0.30–0.70)

## 2016-10-08 NOTE — Plan of Care (Signed)
Problem: Activity: Goal: Risk for activity intolerance will decrease Outcome: Progressing Patient able to ambulate to the [facility], work of breathing has improved, stand by assist.

## 2016-10-08 NOTE — Progress Notes (Signed)
Progress Note  Patient Name: Emily Miranda Date of Encounter: 10/08/2016  Primary Cardiologist: Hochrein   Subjective   Breathing has improved. Weight is trending down.   Inpatient Medications    Scheduled Meds: . amLODipine  5 mg Oral Daily  . aspirin  81 mg Oral Daily  . atorvastatin  40 mg Oral q1800  . cyclobenzaprine  5 mg Oral TID  . docusate sodium  100 mg Oral BID  . furosemide  40 mg Intravenous Q12H  . gabapentin  300 mg Oral QHS  . insulin aspart  0-5 Units Subcutaneous QHS  . insulin aspart  0-9 Units Subcutaneous TID WC  . insulin glargine  6 Units Subcutaneous QHS  . isosorbide mononitrate  15 mg Oral Daily  . mouth rinse  15 mL Mouth Rinse BID  . metoprolol tartrate  12.5 mg Oral BID  . nitroGLYCERIN  0.5 inch Topical Q6H  . pantoprazole  40 mg Oral Daily  . sodium chloride flush  3 mL Intravenous Q12H   Continuous Infusions: . sodium chloride 10 mL/hr at 10/06/16 1603  . sodium chloride    . sodium chloride 10 mL/hr at 10/08/16 0533  . heparin 1,300 Units/hr (10/07/16 1800)   PRN Meds: sodium chloride, acetaminophen, ALPRAZolam, artificial tears, fluticasone, nitroGLYCERIN, ondansetron (ZOFRAN) IV, polyethylene glycol, sodium chloride flush, traMADol   Vital Signs    Vitals:   10/07/16 2118 10/08/16 0100 10/08/16 0413 10/08/16 0801  BP: 138/78  (!) 144/80 (!) 147/83  Pulse: (!) 104 95 (!) 104 91  Resp: (!) 21 (!) 23 (!) 22   Temp: 98.2 F (36.8 C)  98 F (36.7 C)   TempSrc: Oral  Oral   SpO2: 99% 94% 93%   Weight:  146 lb 9.7 oz (66.5 kg) 144 lb 13.5 oz (65.7 kg)   Height:        Intake/Output Summary (Last 24 hours) at 10/08/16 0815 Last data filed at 10/08/16 0751  Gross per 24 hour  Intake           1265.5 ml  Output             2025 ml  Net           -759.5 ml   Filed Weights   10/07/16 2000 10/08/16 0100 10/08/16 0413  Weight: 146 lb 9.7 oz (66.5 kg) 146 lb 9.7 oz (66.5 kg) 144 lb 13.5 oz (65.7 kg)    Telemetry    SR  with episodes of ST - Personally Reviewed  ECG    N/a - Personally Reviewed  Physical Exam   General: Well developed, well nourished, female appearing in no acute distress. Wearing [place]: Normocephalic, atraumatic.  Neck: Supple without bruits, mild JVD. Lungs:  Resp regular and unlabored, CTA. Heart: RRR, S1, S2, no S3, S4, soft 2/6 systolic murmur; no rub. Abdomen: Soft, non-tender, non-distended with normoactive bowel sounds. No hepatomegaly. No rebound/guarding. No obvious abdominal masses. Extremities: No clubbing, cyanosis, 2+ pitting edema. Distal pedal pulses are 2+ bilaterally. Neuro: Alert and oriented X 3. Moves all extremities spontaneously. Psych: Normal affect.  Labs    Chemistry Recent Labs Lab 10/07/16 0233 10/07/16 2010 10/08/16 0634  NA 136 133* 136  K 4.4 4.5 4.3  CL 109 105 109  CO2 17* 16* 15*  GLUCOSE 154* 243* 149*  BUN 39* 42* 41*  CREATININE 1.85* 2.01* 1.86*  CALCIUM 8.6* 8.5* 8.8*  PROT  --   --  6.5  ALBUMIN  --   --  2.0*  AST  --   --  14*  ALT  --   --  11*  ALKPHOS  --   --  90  BILITOT  --   --  0.4  GFRNONAA 26* 23* 26*  GFRAA 30* 27* 30*  ANIONGAP 10 12 12      Hematology Recent Labs Lab 10/07/16 0233 10/07/16 2010 10/08/16 0634  WBC 8.9 8.5 7.7  RBC 3.04* 3.28* 3.32*  HGB 7.1* 8.1* 8.2*  HCT 24.1* 26.4* 27.0*  MCV 79.3 80.5 81.3  MCH 23.4* 24.7* 24.7*  MCHC 29.5* 30.7 30.4  RDW 19.4* 18.9* 19.2*  PLT 573* 541* 513*    Cardiac Enzymes Recent Labs Lab 10/06/16 1517 10/06/16 2031 10/07/16 0233  TROPONINI 0.83* 0.79* 0.79*   No results for input(s): TROPIPOC in the last 168 hours.   BNPNo results for input(s): BNP, PROBNP in the last 168 hours.   DDimer No results for input(s): DDIMER in the last 168 hours.    Radiology    Dg Chest Port 1 View  Result Date: 10/06/2016 CLINICAL DATA:  Shortness of breath with left-sided chest pain. History of hypertension, diabetes and congestive heart failure. EXAM:  PORTABLE CHEST 1 VIEW COMPARISON:  Radiographs 10/06/2016.  CT 08/01/2016. FINDINGS: 1655 hour. The heart size and mediastinal contours are stable. There are small bilateral pleural effusions with mild bibasilar atelectasis, vascular congestion and probable mild edema. No confluent airspace opacity or pneumothorax. No acute osseous findings are seen. Telemetry leads overlie the chest. IMPRESSION: Probable mild congestive heart failure with mild edema and small bilateral pleural effusions. Electronically Signed   By: Carey Bullocks M.D.   On: 10/06/2016 17:03    Cardiac Studies   N/A  Patient Profile     76 y.o. female with history of mild to moderate aortic stenosis, hypertension, diabetes and mixed systolic and diastolic heart failure. She also has moderate renal insufficiency. She was admitted with unstable angina. Her enzymes are low and flat. She is on IV heparin.   Assessment & Plan    1. NSTEMI: presented with chest pain. Currently on IV heparin. No further episodes of chest pain since admission.  -- continue nitro paste, BB, ASA -- plan to for cath tomorrow pending renal function  2. Acute combined diastolic and systolic CHF: Good UOP thus far, with weight trending down. Will continue IV lasix this afternoon, and hold in the am for possible cath.  -- follow BMET.   3. Anemia: Received 1 unit PRBCs yesterday with improvement in Hgb from 7.1>>8.2 today.  -- follow CBC -- [facility] following, appreciate assistance  4. CKD with AKI: Cr better this morning at 1.8 -- follow BMET  5. DM: on SSI   Signed, Laverda Page, NP  10/08/2016, 8:15 AM    I have personally seen and examined this patient with Laverda Page, NP. I agree with the assessment and plan as outlined above. She is admitted with unstable angina. Mildly elevated troponin with flat trend c/w NSTEMI. Cardiac cath is indicated but will delay until tomorrow given continued volume overload, renal insufficiency and anemia.  Echo march 2018 with mild LV systolic dysfunction. She is having no chest pain currently. Continue IV Lasix today.   Verne Carrow 10/08/2016 9:00 AM

## 2016-10-08 NOTE — Consult Note (Signed)
   [facility] CM Inpatient Consult   10/08/2016  Emily Miranda 1941/05/06 [id number]    Screened for [facility] Care Management program. Micah Flesher to bedside to speak with Emily Miranda to discuss and offer [facility] services.  Emily Miranda is agreeable and [facility] Care Management written consent was obtained. [facility] Care Management packet provided as well.   Patient reports she lives with her husband. Denies having concerns with medications or with transportation.   Agreeable to chronic disease management and education for CHF, DM, HTN. Confirms Primary Care MD is Dr. Jeanie Sewer. Confirms best contact number is [phone].  Explained that [facility] Care Management will not interfere or replace services provided by home health. Explained that she will receive post discharge calls and will be evaluated for monthly home visits. She is agreeable to this.  Emily Miranda is scheduled to have cardiac procedure tomorrow. Will continue to follow and request to be assigned to [facility]. Will make inpatient RNCM aware [facility] Care Management will follow post discharge.   Raiford Noble, MSN-Ed, RN,BSN [facility] Liaison [phone]

## 2016-10-08 NOTE — Telephone Encounter (Signed)
Thanks

## 2016-10-08 NOTE — Progress Notes (Signed)
ANTICOAGULATION CONSULT NOTE - Follow Up Consult  Pharmacy Consult for Heparin Indication: chest pain/ACS  Allergies  Allergen Reactions  . Levofloxacin Nausea And Vomiting    Other reaction(s): Confusion  . Gabapentin Other (See Comments)    Disoriented, no strength in legs   Patient Measurements: Height: 5\' 6"  (167.6 cm) Weight: 144 lb 13.5 oz (65.7 kg) IBW/kg (Calculated) : 59.3   Vital Signs: Temp: 98 F (36.7 C) (05/29 0413) Temp Source: Oral (05/29 0413) BP: 147/83 (05/29 0801) Pulse Rate: 91 (05/29 0801)  Labs:  Recent Labs  10/06/16 1517 10/06/16 2031  10/07/16 0233 10/07/16 1006 10/07/16 2010 10/07/16 2026 10/08/16 0634  HGB  --   --   < > 7.1*  --  8.1*  --  8.2*  HCT  --   --   --  24.1*  --  26.4*  --  27.0*  PLT  --   --   --  573*  --  541*  --  513*  LABPROT  --   --   --   --   --  15.2  --   --   INR  --   --   --   --   --  1.19  --   --   HEPARINUNFRC  --   --   < >  --  0.12*  --  0.35 0.33  CREATININE  --   --   --  1.85*  --  2.01*  --  1.86*  TROPONINI 0.83* 0.79*  --  0.79*  --   --   --   --   < > = values in this interval not displayed.  Estimated Creatinine Clearance: 24.8 mL/min (A) (by C-G formula based on SCr of 1.86 mg/dL (H)).  Medications:  Heparin @ 1300 units/hr  Assessment: 75yof continues on heparin for chest pain with plan for cath 5/30 once renal function improves. Patient still volume overloaded, receiving IV Lasix and she is also anemic.  Received 1 uPRBC 5/28, HgB low but stable 8.2.  Heparin level is therapeutic at 0.33. No chest pain today. No infusion related issues or bleeding documented.  Goal of Therapy:  Heparin level 0.3-0.7 units/ml Monitor platelets by anticoagulation protocol: Yes   Plan:  1) Continue heparin at 1300 units/hr 2) Follow up daily heparin level and CBC 3) Monitor for s/sx of bleeding  Akira Perusse L Cobe Viney 10/08/2016,9:45 AM

## 2016-10-08 NOTE — Progress Notes (Signed)
[facility] PROGRESS NOTE  Emily PIZANA Miranda:[id number] DOB: 09-Oct-1940 DOA: 10/06/2016   PCP: Noni Saupe, MD     Assessment/Plan: Principal Problem:   Non-ST elevation (NSTEMI) myocardial infarction [facility]) Active Problems:   DM type 2 causing CKD stage 3 (HCC)   Mild aortic stenosis   CHEST PAIN   HTN (hypertension)   Shortness of breath   Acute combined systolic and diastolic congestive heart failure (HCC)   Chronic renal insufficiency, stage 3 (moderate)   Chronic diabetic ulcer of right foot    NSTEMI (non-ST elevated myocardial infarction) (HCC)   76 y.o. female with past history of diastolic CHF, moderate aortic stenosis, hypertension, diabetes recently started on insulin, chronic anemia, neuropathy, chronic kidney disease with baseline creatinine of 1.5-1.8 admitted today from [facility] emergency room for  NSTEMI and CHF. TR[facility] consulted for evaluation of anemia and diabetes on insulin. Patient reports having some kidney disease for a few years now is not followed by a nephrologist. She has also been noted to have anemia for several months, few months ago she saw a hematologist in [place] Dr. Jola Babinski and was subsequently started on Aranesp injections, of which she has had 2 or 3 in the recent past. She denies any blood in her stools or black stools, she is up-to-date on colonoscopies and her last one was about 3 years ago, which was reportedly normal.    Assessment and plan DM on Insulin, Accu-Chek stable -continue lantus  , SSI Hemoglobin A1c 11.3  Anemia-baseline around 9  suspect multifactorial  from CKD primarily, clinically volume overloaded on exam too, so some dilutional component possible too Anemia panel consistent with anemia of chronic disease -hemoccult stool pending -depending on anemia panel will be able to order Epo or IV Iron as needed -was getting Aranesp shots from Dr.Quincy Lewis's office -uptodate on  colonoscopies Continue PPI for GI prophylaxis  Hemoglobin 8.2 after receiving 1 unit of packed red blood cells   CKD 3 -stable, baseline 1.5-1.8, creatinine is around 1.86 today    NSTEMI/CHF-EF 40-45% -per Cards -lasix/heparin/ plan for LHC depending on clinical progress and Creatinine, cardiac cath as indicated by delayed due to renal function   Leg cramps  Has a hx of neuropathy Started flexeril for leg cramps  Patient states that she is allergic to gabapentin   DVT prophylaxsis heparin  Code Status:  Full code    Family Communication: Discussed in detail with the patient, all imaging results, lab results explained to the patient   Disposition Plan:   As per cardiology      Consultants:  Cardiology, [facility]  Procedures:  None  Antibiotics: Anti-infectives    None         HPI/Subjective:  Patient denies any shortness of breath today  Objective: Vitals:   10/07/16 2118 10/08/16 0100 10/08/16 0413 10/08/16 0801  BP: 138/78  (!) 144/80 (!) 147/83  Pulse: (!) 104 95 (!) 104 91  Resp: (!) 21 (!) 23 (!) 22   Temp: 98.2 F (36.8 C)  98 F (36.7 C)   TempSrc: Oral  Oral   SpO2: 99% 94% 93%   Weight:  66.5 kg (146 lb 9.7 oz) 65.7 kg (144 lb 13.5 oz)   Height:        Intake/Output Summary (Last 24 hours) at 10/08/16 0952 Last data filed at 10/08/16 0946  Gross per 24 hour  Intake  1028.5 ml  Output             2125 ml  Net          -1096.5 ml    Exam:  Examination:  General exam: Appears calm and comfortable  Respiratory system: Clear to auscultation. Respiratory effort normal. Cardiovascular system: S1 & S2 heard, RRR. No JVD, murmurs, rubs, gallops or clicks. No pedal edema. Gastrointestinal system: Abdomen is nondistended, soft and nontender. No organomegaly or masses felt. Normal bowel sounds heard. Central nervous system: Alert and oriented. No focal neurological deficits. Extremities: Symmetric 5 x 5 power. Skin: No rashes,  lesions or ulcers Psychiatry: Judgement and insight appear normal. Mood & affect appropriate.     Data Reviewed: I have personally reviewed following labs and imaging studies  Micro Results No results found for this or any previous visit (from the past 240 hour(s)).  Radiology Reports Dg Chest Port 1 View  Result Date: 10/06/2016 CLINICAL DATA:  Shortness of breath with left-sided chest pain. History of hypertension, diabetes and congestive heart failure. EXAM: PORTABLE CHEST 1 VIEW COMPARISON:  Radiographs 10/06/2016.  CT 08/01/2016. FINDINGS: 1655 hour. The heart size and mediastinal contours are stable. There are small bilateral pleural effusions with mild bibasilar atelectasis, vascular congestion and probable mild edema. No confluent airspace opacity or pneumothorax. No acute osseous findings are seen. Telemetry leads overlie the chest. IMPRESSION: Probable mild congestive heart failure with mild edema and small bilateral pleural effusions. Electronically Signed   By: Carey Bullocks M.D.   On: 10/06/2016 17:03     CBC  Recent Labs Lab 10/07/16 0233 10/07/16 2010 10/08/16 0634  WBC 8.9 8.5 7.7  HGB 7.1* 8.1* 8.2*  HCT 24.1* 26.4* 27.0*  PLT 573* 541* 513*  MCV 79.3 80.5 81.3  MCH 23.4* 24.7* 24.7*  MCHC 29.5* 30.7 30.4  RDW 19.4* 18.9* 19.2*    Chemistries   Recent Labs Lab 10/06/16 1517 10/07/16 0233 10/07/16 2010 10/08/16 0634  NA  --  136 133* 136  K  --  4.4 4.5 4.3  CL  --  109 105 109  CO2  --  17* 16* 15*  GLUCOSE  --  154* 243* 149*  BUN  --  39* 42* 41*  CREATININE  --  1.85* 2.01* 1.86*  CALCIUM  --  8.6* 8.5* 8.8*  MG 2.1  --   --   --   AST  --   --   --  14*  ALT  --   --   --  11*  ALKPHOS  --   --   --  90  BILITOT  --   --   --  0.4   ------------------------------------------------------------------------------------------------------------------ estimated creatinine clearance is 24.8 mL/min (A) (by C-G formula based on SCr of 1.86 mg/dL  (H)). ------------------------------------------------------------------------------------------------------------------  Recent Labs  10/06/16 1517  HGBA1C 11.3*   ------------------------------------------------------------------------------------------------------------------  Recent Labs  10/07/16 0233  CHOL 125  HDL 34*  LDLCALC 72  TRIG 97  CHOLHDL 3.7   ------------------------------------------------------------------------------------------------------------------  Recent Labs  10/06/16 1517  TSH 5.876*   ------------------------------------------------------------------------------------------------------------------  Recent Labs  10/06/16 1639 10/06/16 1640  VITAMINB12 920*  --   FOLATE  --  25.9  FERRITIN 127  --   TIBC 213*  --   IRON 23*  --   RETICCTPCT 2.7  --     Coagulation profile  Recent Labs Lab 10/07/16 2010  INR 1.19    No results for input(s): DDIMER in the  last 72 hours.  Cardiac Enzymes  Recent Labs Lab 10/06/16 1517 10/06/16 2031 10/07/16 0233  TROPONINI 0.83* 0.79* 0.79*   ------------------------------------------------------------------------------------------------------------------ Invalid input(s): POCBNP   CBG:  Recent Labs Lab 10/07/16 0748 10/07/16 1121 10/07/16 1653 10/07/16 2116 10/08/16 0725  GLUCAP 121* 130* 135* 244* 133*       Studies: Dg Chest Port 1 View  Result Date: 10/06/2016 CLINICAL DATA:  Shortness of breath with left-sided chest pain. History of hypertension, diabetes and congestive heart failure. EXAM: PORTABLE CHEST 1 VIEW COMPARISON:  Radiographs 10/06/2016.  CT 08/01/2016. FINDINGS: 1655 hour. The heart size and mediastinal contours are stable. There are small bilateral pleural effusions with mild bibasilar atelectasis, vascular congestion and probable mild edema. No confluent airspace opacity or pneumothorax. No acute osseous findings are seen. Telemetry leads overlie the chest.  IMPRESSION: Probable mild congestive heart failure with mild edema and small bilateral pleural effusions. Electronically Signed   By: Carey Bullocks M.D.   On: 10/06/2016 17:03      Lab Results  Component Value Date   HGBA1C 11.3 (H) 10/06/2016   HGBA1C 12.5 (H) 07/29/2016   Lab Results  Component Value Date   LDLCALC 72 10/07/2016   CREATININE 1.86 (H) 10/08/2016       Scheduled Meds: . amLODipine  5 mg Oral Daily  . aspirin  81 mg Oral Daily  . atorvastatin  40 mg Oral q1800  . cyclobenzaprine  5 mg Oral TID  . docusate sodium  100 mg Oral BID  . furosemide  40 mg Intravenous Q12H  . gabapentin  300 mg Oral QHS  . insulin aspart  0-5 Units Subcutaneous QHS  . insulin aspart  0-9 Units Subcutaneous TID WC  . insulin glargine  6 Units Subcutaneous QHS  . isosorbide mononitrate  15 mg Oral Daily  . mouth rinse  15 mL Mouth Rinse BID  . metoprolol tartrate  12.5 mg Oral BID  . nitroGLYCERIN  0.5 inch Topical Q6H  . pantoprazole  40 mg Oral Daily  . sodium chloride flush  3 mL Intravenous Q12H   Continuous Infusions: . sodium chloride 10 mL/hr at 10/06/16 1603  . sodium chloride    . sodium chloride 10 mL/hr at 10/08/16 0533  . heparin 1,300 Units/hr (10/08/16 0900)     LOS: 2 days    Time spent: >30 MINS    Richarda Overlie  [facility] Pager [phone]. If 7PM-7AM, please contact night-coverage at [email or web address], password [facility] 10/08/2016, 9:52 AM  LOS: 2 days

## 2016-10-08 NOTE — Progress Notes (Signed)
Inpatient Diabetes Program Recommendations  AACE/ADA: New Consensus Statement on Inpatient Glycemic Control (2015)  Target Ranges:  Prepandial:   less than 140 mg/dL      Peak postprandial:   less than 180 mg/dL (1-2 hours)      Critically ill patients:  140 - 180 mg/dL   Results for Emily Miranda, Emily Miranda (MRN [id number]) as of 10/08/2016 15:13  Ref. Range 10/08/2016 07:25 10/08/2016 11:16  Glucose-Capillary Latest Ref Range: 65 - 99 mg/dL 727 (H) 618 (H)   Results for Emily Miranda, Emily Miranda (MRN [id number]) as of 10/08/2016 15:13  Ref. Range 07/29/2016 19:58 10/06/2016 15:17  Hemoglobin A1C Latest Ref Range: 4.8 - 5.6 % 12.5 (H) 11.3 (H)    Home DM Meds: Lantus 40 units QHS       Tradjenta 5 mg daily       Amaryl 8 mg daily  Current Insulin Orders: Lantus 6 units QHS      Novolog Sensitive Correction Scale/ SSI (0-9 units) TID AC + HS       Spoke with patient about her current A1c of 11.3% (slightly down from A1c of 12.5% back on 07/29/16).  Explained what an A1c is and what it measures.  Reminded patient that her goal A1c is 7% or less per ADA standards to prevent both acute and long-term complications.  Explained to patient the extreme importance of good glucose control at home.  Encouraged patient to check her CBGs at least bid at home (fasting and another check within the day) and to record all CBGs in a logbook for her PCP to review.  Patient stated she sees Dr. Jeanie Sewer with [facility] in [place] for her medical care.  Patient told me Dr. Jeanie Sewer increased her Amaryl to 8 mg daily about 2 weeks ago.  Did not have any questions for me regarding her diabetes care at home.  Stated she saw a nutritionist the last time she was hospitalized for diet education.     --Will follow patient during hospitalization--  Ambrose Finland RN, MSN, CDE Diabetes Coordinator Inpatient Glycemic Control Team Team Pager: [phone] (8a-5p)

## 2016-10-08 NOTE — Telephone Encounter (Signed)
Pt sister called to let you know that she is currently in the hospital room [facility].

## 2016-10-09 ENCOUNTER — Encounter (HOSPITAL_COMMUNITY)
Admission: AD | Disposition: A | Discharge status: Home Health | Payer: Self-pay | Source: Other Acute Inpatient Hospital | Attending: Internal Medicine

## 2016-10-09 LAB — CBC
HCT: 27.1 % — ABNORMAL LOW (ref 36.0–46.0)
Hemoglobin: 8.2 g/dL — ABNORMAL LOW (ref 12.0–15.0)
MCH: 24.8 pg — ABNORMAL LOW (ref 26.0–34.0)
MCHC: 30.3 g/dL (ref 30.0–36.0)
MCV: 82.1 fL (ref 78.0–100.0)
Platelets: 538 10*3/uL — ABNORMAL HIGH (ref 150–400)
RBC: 3.3 MIL/uL — ABNORMAL LOW (ref 3.87–5.11)
RDW: 19.9 % — ABNORMAL HIGH (ref 11.5–15.5)
WBC: 7.3 10*3/uL (ref 4.0–10.5)

## 2016-10-09 LAB — COMPREHENSIVE METABOLIC PANEL
ALT: 8 U/L — ABNORMAL LOW (ref 14–54)
AST: 13 U/L — ABNORMAL LOW (ref 15–41)
Albumin: 1.9 g/dL — ABNORMAL LOW (ref 3.5–5.0)
Alkaline Phosphatase: 91 U/L (ref 38–126)
Anion gap: 10 (ref 5–15)
BUN: 39 mg/dL — ABNORMAL HIGH (ref 6–20)
CO2: 18 mmol/L — ABNORMAL LOW (ref 22–32)
Calcium: 8.8 mg/dL — ABNORMAL LOW (ref 8.9–10.3)
Chloride: 107 mmol/L (ref 101–111)
Creatinine, Ser: 1.83 mg/dL — ABNORMAL HIGH (ref 0.44–1.00)
GFR calc Af Amer: 30 mL/min — ABNORMAL LOW (ref >60)
GFR calc non Af Amer: 26 mL/min — ABNORMAL LOW (ref >60)
Glucose, Bld: 220 mg/dL — ABNORMAL HIGH (ref 65–99)
Potassium: 4.7 mmol/L (ref 3.5–5.1)
Sodium: 135 mmol/L (ref 135–145)
Total Bilirubin: 0.2 mg/dL — ABNORMAL LOW (ref 0.3–1.2)
Total Protein: 6.7 g/dL (ref 6.5–8.1)

## 2016-10-09 LAB — GLUCOSE, CAPILLARY
Glucose-Capillary: 200 mg/dL — ABNORMAL HIGH (ref 65–99)
Glucose-Capillary: 121 mg/dL — ABNORMAL HIGH (ref 65–99)
Glucose-Capillary: 127 mg/dL — ABNORMAL HIGH (ref 65–99)
Glucose-Capillary: 288 mg/dL — ABNORMAL HIGH (ref 65–99)

## 2016-10-09 LAB — HEPARIN LEVEL (UNFRACTIONATED): Heparin Unfractionated: 0.36 IU/mL (ref 0.30–0.70)

## 2016-10-09 SURGERY — INVASIVE LAB ABORTED CASE

## 2016-10-09 MED ORDER — FUROSEMIDE 10 MG/ML IJ SOLN
40.0000 mg | Freq: Two times a day (BID) | INTRAMUSCULAR | Status: DC
  Administered 2016-10-09 – 2016-10-15 (×11): 40 mg via INTRAVENOUS
  Filled 2016-10-09 (×12): qty 4

## 2016-10-09 MED ORDER — INSULIN GLARGINE 100 UNIT/ML ~~LOC~~ SOLN
10.0000 [IU] | Freq: Every day | SUBCUTANEOUS | Status: DC
  Administered 2016-10-09 – 2016-10-12 (×4): 10 [IU] via SUBCUTANEOUS
  Filled 2016-10-09 (×4): qty 0.1

## 2016-10-09 SURGICAL SUPPLY — 4 items
KIT HEART LEFT (KITS) ×1 IMPLANT
PACK CARDIAC CATHETERIZATION (CUSTOM PROCEDURE TRAY) ×1 IMPLANT
TRANSDUCER W/STOPCOCK (MISCELLANEOUS) ×1 IMPLANT
TUBING CIL FLEX 10 FLL-RA (TUBING) ×1 IMPLANT

## 2016-10-09 NOTE — Progress Notes (Signed)
Pt had episode of 8/10 chest pressure after "washing up for bed". VS stable (see flow sheet). Given SL Ntg x3 per PRN orders with complete relief of discomfort. Will continue to monitor. me

## 2016-10-09 NOTE — Progress Notes (Addendum)
Progress Note  Patient Name: Emily Miranda Date of Encounter: 10/09/2016  Primary Cardiologist: Antoine Poche  Subjective   Had an episode of chest pain last evening after getting a bath.   Inpatient Medications    Scheduled Meds: . amLODipine  5 mg Oral Daily  . aspirin  81 mg Oral Daily  . atorvastatin  40 mg Oral q1800  . cyclobenzaprine  5 mg Oral TID  . docusate sodium  100 mg Oral BID  . insulin aspart  0-5 Units Subcutaneous QHS  . insulin aspart  0-9 Units Subcutaneous TID WC  . insulin glargine  6 Units Subcutaneous QHS  . isosorbide mononitrate  15 mg Oral Daily  . mouth rinse  15 mL Mouth Rinse BID  . metoprolol tartrate  12.5 mg Oral BID  . nitroGLYCERIN  0.5 inch Topical Q6H  . pantoprazole  40 mg Oral Daily  . sodium chloride flush  3 mL Intravenous Q12H   Continuous Infusions: . sodium chloride 10 mL/hr at 10/06/16 1603  . sodium chloride    . sodium chloride 10 mL/hr at 10/08/16 0533  . heparin 1,300 Units/hr (10/09/16 0425)   PRN Meds: sodium chloride, acetaminophen, ALPRAZolam, artificial tears, fluticasone, nitroGLYCERIN, ondansetron (ZOFRAN) IV, polyethylene glycol, sodium chloride flush, traMADol   Vital Signs    Vitals:   10/08/16 0801 10/08/16 1458 10/08/16 1930 10/09/16 0425  BP: (!) 147/83 (!) 132/91 (!) 132/96 (!) 145/75  Pulse: 91 92 (!) 107 92  Resp:  20 20 18   Temp:  98.1 F (36.7 C) 98.2 F (36.8 C) 98.3 F (36.8 C)  TempSrc:  Oral Oral Oral  SpO2:  99% 99% 99%  Weight:    147 lb 9.6 oz (67 kg)  Height:        Intake/Output Summary (Last 24 hours) at 10/09/16 0803 Last data filed at 10/09/16 0600  Gross per 24 hour  Intake              483 ml  Output             1070 ml  Net             -587 ml   Filed Weights   10/08/16 0100 10/08/16 0413 10/09/16 0425  Weight: 146 lb 9.7 oz (66.5 kg) 144 lb 13.5 oz (65.7 kg) 147 lb 9.6 oz (67 kg)    Telemetry    SR with PVCs - Personally Reviewed  ECG    N/A - Personally  Reviewed  Physical Exam   General: Well developed, well nourished, female appearing in no acute distress. Head: Normocephalic, atraumatic.  Neck: Supple without bruits, JVD. Lungs:  Resp regular and unlabored, Diminished bilaterally. Heart: RRR, S1, S2, no S3, S4, or murmur; no rub. Abdomen: Soft, non-tender, non-distended with normoactive bowel sounds. No hepatomegaly. No rebound/guarding. No obvious abdominal masses. Extremities: No clubbing, cyanosis, 1+ LE edema R>L (chronically). Distal pedal pulses are 2+ bilaterally. Neuro: Alert and oriented X 3. Moves all extremities spontaneously. Psych: Normal affect.  Labs    Chemistry Recent Labs Lab 10/07/16 2010 10/08/16 0634 10/09/16 0428  NA 133* 136 135  K 4.5 4.3 4.7  CL 105 109 107  CO2 16* 15* 18*  GLUCOSE 243* 149* 220*  BUN 42* 41* 39*  CREATININE 2.01* 1.86* 1.83*  CALCIUM 8.5* 8.8* 8.8*  PROT  --  6.5 6.7  ALBUMIN  --  2.0* 1.9*  AST  --  14* 13*  ALT  --  11*  8*  ALKPHOS  --  90 91  BILITOT  --  0.4 0.2*  GFRNONAA 23* 26* 26*  GFRAA 27* 30* 30*  ANIONGAP 12 12 10      Hematology Recent Labs Lab 10/07/16 2010 10/08/16 0634 10/09/16 0428  WBC 8.5 7.7 7.3  RBC 3.28* 3.32* 3.30*  HGB 8.1* 8.2* 8.2*  HCT 26.4* 27.0* 27.1*  MCV 80.5 81.3 82.1  MCH 24.7* 24.7* 24.8*  MCHC 30.7 30.4 30.3  RDW 18.9* 19.2* 19.9*  PLT 541* 513* 538*    Cardiac Enzymes Recent Labs Lab 10/06/16 1517 10/06/16 2031 10/07/16 0233  TROPONINI 0.83* 0.79* 0.79*   No results for input(s): TROPIPOC in the last 168 hours.   BNPNo results for input(s): BNP, PROBNP in the last 168 hours.   DDimer No results for input(s): DDIMER in the last 168 hours.    Radiology    No results found.  Cardiac Studies   N/A  Patient Profile     76 y.o. female with history of mild to moderate aortic stenosis, hypertension, diabetes and mixed systolic and diastolic heart failure. She also has moderate renal insufficiency. She was  admitted with unstable angina. Her enzymes are low and flat. She is on IV heparin.  Assessment & Plan    1. NSTEMI: presented with chest pain. Currently on IV heparin. Episode of chest pain last evening after her bath, comfortable at this time. -- continue nitro paste, BB, ASA -- plan to for cath today, renal function stable  2. Acute combined diastolic and systolic CHF: Good UOP thus far, weight back up this morning, but IV lasix held in anticipation for cardiac cath today.  -- follow BMET, plan to resume lasix post cath.   3. Anemia: Received 1 unit PRBCs 10/07/16 with improvement in Hgb from 7.1>>8.2. H/H stable. -- follow CBC -- [facility] following, appreciate assistance  4. CKD with AKI: Cr stable this morning. -- follow BMET  5. DM: on SSI   Signed, Laverda Page, NP  10/09/2016, 8:03 AM    I have personally seen and examined this patient. I agree with the assessment and plan as outlined above. She is admitted with unstable angina with subtle elevation troponin, flat trend. She is scheduled for a cardiac cath today with possible PCI. Renal function is stable. Chronic anemia is stable with no signs of bleeding. Heme negative in March. Stool heme check pending this admit. Lasix on hold for cath given renal insufficiency. Resume post cath. She is known to have mild to moderate AS by echo march 2018.   Verne Carrow 10/09/2016 11:05 AM   Addendum:  Pt arrived in cath lab and unable to lay flat for cath. She was felt to have continued volume overload. Cath cancelled. She will be restarted on IV Lasix today and reconsider cath in 1-2 days.   Verne Carrow 10/09/2016 4:55 PM

## 2016-10-09 NOTE — Progress Notes (Signed)
[facility] PROGRESS NOTE  Emily Miranda JQD:[id number] DOB: April 26, 1941 DOA: 10/06/2016   PCP: Noni Saupe, MD     Assessment/Plan: Principal Problem:   Non-ST elevation (NSTEMI) myocardial infarction [facility]) Active Problems:   DM type 2 causing CKD stage 3 (HCC)   Mild aortic stenosis   CHEST PAIN   HTN (hypertension)   Shortness of breath   Acute combined systolic and diastolic congestive heart failure (HCC)   Chronic renal insufficiency, stage 3 (moderate)   Chronic diabetic ulcer of right foot    NSTEMI (non-ST elevated myocardial infarction) (HCC)   76 y.o. female with past history of diastolic CHF, moderate aortic stenosis, hypertension, diabetes recently started on insulin, chronic anemia, neuropathy, chronic kidney disease with baseline creatinine of 1.5-1.8 admitted today from [facility] emergency room for  NSTEMI and CHF. [facility] consulted for evaluation of anemia and diabetes on insulin. Patient reports having some kidney disease for a few years now is not followed by a nephrologist. She has also been noted to have anemia for several months, few months ago she saw a hematologist in [place] Dr. Jola Babinski and was subsequently started on Aranesp injections, of which she has had 2 or 3 in the recent past. She denies any blood in her stools or black stools, she is up-to-date on colonoscopies and her last one was about 3 years ago, which was reportedly normal.    Assessment and plan:  Diabetes mellitus:  CBG (last 3)   Recent Labs  10/08/16 1612 10/08/16 2157 10/09/16 0755  GLUCAP 204* 233* 200*    cbg's not well controlled. Increased lantus to 10 units at bedtime and resume SSI.  Hemoglobin A1c 11.3  Anemia-baseline around 9  suspect multifactorial Anemia panel consistent with anemia of chronic disease/ anemia from chronic kidney disease.  Fecal occult blood was negative in march 2018. This admission hemoccult is pending.  Anemia panel  shows low iron of 23 with adequate vitamin b12 levels and folate levels. Ferritin is 127. She will probably need iron supplementation in addition to Epo injections.  -was getting Aranesp shots from Dr.Quincy Lewis's office -uptodate on colonoscopies Continue PPI for GI prophylaxis  Hemoglobin  Stable around 8.2 after receiving 1 unit of packed red blood cells   CKD 3 -stable, baseline 1.5-1.8, creatinine is around 1.83 today    NSTEMI/CHF-EF 40-45% -per Cards -lasix/heparin/ plan for LHC depending on clinical progress and Creatinine, cardiac cath as indicated by delayed due to renal function   Leg cramps  Has a hx of neuropathy Started flexeril for leg cramps  Patient states that she is allergic to gabapentin  Abnormal TSH:  Get free t3 and free t4.    DVT prophylaxsis heparin  Code Status:  Full code    Family Communication: Discussed in detail with the patient, all imaging results, lab results explained to the patient   Disposition Plan:   As per cardiology      Consultants:   [facility]  Procedures:  None  Antibiotics: Anti-infectives    None         HPI/Subjective: No chest pain, or sob at this time.   Objective: Vitals:   10/08/16 1458 10/08/16 1930 10/09/16 0425 10/09/16 0851  BP: (!) 132/91 (!) 132/96 (!) 145/75 (!) 141/78  Pulse: 92 (!) 107 92 90  Resp: 20 20 18    Temp: 98.1 F (36.7 C) 98.2 F (36.8 C) 98.3 F (36.8 C)   TempSrc: Oral Oral  Oral   SpO2: 99% 99% 99%   Weight:   67 kg (147 lb 9.6 oz)   Height:        Intake/Output Summary (Last 24 hours) at 10/09/16 0911 Last data filed at 10/09/16 0600  Gross per 24 hour  Intake              483 ml  Output             1070 ml  Net             -587 ml    Exam:  Examination:  General exam: Appears calm and comfortable  Respiratory system: Clear to auscultation. Respiratory effort normal. Cardiovascular system: S1 & S2 heard, RRR. No JVD, murmurs, rubs, gallops or clicks. No pedal  edema. Gastrointestinal system: Abdomen is nondistended, soft and nontender. No organomegaly or masses felt. Normal bowel sounds heard. Central nervous system: Alert and oriented. No focal neurological deficits. Extremities: Symmetric 5 x 5 power. Skin: No rashes, lesions or ulcers Psychiatry: Judgement and insight appear normal. Mood & affect appropriate.     Data Reviewed: I have personally reviewed following labs and imaging studies  Micro Results No results found for this or any previous visit (from the past 240 hour(s)).  Radiology Reports Dg Chest Port 1 View  Result Date: 10/06/2016 CLINICAL DATA:  Shortness of breath with left-sided chest pain. History of hypertension, diabetes and congestive heart failure. EXAM: PORTABLE CHEST 1 VIEW COMPARISON:  Radiographs 10/06/2016.  CT 08/01/2016. FINDINGS: 1655 hour. The heart size and mediastinal contours are stable. There are small bilateral pleural effusions with mild bibasilar atelectasis, vascular congestion and probable mild edema. No confluent airspace opacity or pneumothorax. No acute osseous findings are seen. Telemetry leads overlie the chest. IMPRESSION: Probable mild congestive heart failure with mild edema and small bilateral pleural effusions. Electronically Signed   By: Carey Bullocks M.D.   On: 10/06/2016 17:03     CBC  Recent Labs Lab 10/07/16 0233 10/07/16 2010 10/08/16 0634 10/09/16 0428  WBC 8.9 8.5 7.7 7.3  HGB 7.1* 8.1* 8.2* 8.2*  HCT 24.1* 26.4* 27.0* 27.1*  PLT 573* 541* 513* 538*  MCV 79.3 80.5 81.3 82.1  MCH 23.4* 24.7* 24.7* 24.8*  MCHC 29.5* 30.7 30.4 30.3  RDW 19.4* 18.9* 19.2* 19.9*    Chemistries   Recent Labs Lab 10/06/16 1517 10/07/16 0233 10/07/16 2010 10/08/16 0634 10/09/16 0428  NA  --  136 133* 136 135  K  --  4.4 4.5 4.3 4.7  CL  --  109 105 109 107  CO2  --  17* 16* 15* 18*  GLUCOSE  --  154* 243* 149* 220*  BUN  --  39* 42* 41* 39*  CREATININE  --  1.85* 2.01* 1.86* 1.83*   CALCIUM  --  8.6* 8.5* 8.8* 8.8*  MG 2.1  --   --   --   --   AST  --   --   --  14* 13*  ALT  --   --   --  11* 8*  ALKPHOS  --   --   --  90 91  BILITOT  --   --   --  0.4 0.2*   ------------------------------------------------------------------------------------------------------------------ estimated creatinine clearance is 25.2 mL/min (A) (by C-G formula based on SCr of 1.83 mg/dL (H)). ------------------------------------------------------------------------------------------------------------------  Recent Labs  10/06/16 1517  HGBA1C 11.3*   ------------------------------------------------------------------------------------------------------------------  Recent Labs  10/07/16 0233  CHOL 125  HDL 34*  LDLCALC 72  TRIG 97  CHOLHDL 3.7   ------------------------------------------------------------------------------------------------------------------  Recent Labs  10/06/16 1517  TSH 5.876*   ------------------------------------------------------------------------------------------------------------------  Recent Labs  10/06/16 1639 10/06/16 1640  VITAMINB12 920*  --   FOLATE  --  25.9  FERRITIN 127  --   TIBC 213*  --   IRON 23*  --   RETICCTPCT 2.7  --     Coagulation profile  Recent Labs Lab 10/07/16 2010  INR 1.19    No results for input(s): DDIMER in the last 72 hours.  Cardiac Enzymes  Recent Labs Lab 10/06/16 1517 10/06/16 2031 10/07/16 0233  TROPONINI 0.83* 0.79* 0.79*   ------------------------------------------------------------------------------------------------------------------ Invalid input(s): POCBNP   CBG:  Recent Labs Lab 10/08/16 0725 10/08/16 1116 10/08/16 1612 10/08/16 2157 10/09/16 0755  GLUCAP 133* 143* 204* 233* 200*       Studies: No results found.    Lab Results  Component Value Date   HGBA1C 11.3 (H) 10/06/2016   HGBA1C 12.5 (H) 07/29/2016   Lab Results  Component Value Date   LDLCALC 72  10/07/2016   CREATININE 1.83 (H) 10/09/2016       Scheduled Meds: . amLODipine  5 mg Oral Daily  . aspirin  81 mg Oral Daily  . atorvastatin  40 mg Oral q1800  . cyclobenzaprine  5 mg Oral TID  . docusate sodium  100 mg Oral BID  . insulin aspart  0-5 Units Subcutaneous QHS  . insulin aspart  0-9 Units Subcutaneous TID WC  . insulin glargine  6 Units Subcutaneous QHS  . isosorbide mononitrate  15 mg Oral Daily  . mouth rinse  15 mL Mouth Rinse BID  . metoprolol tartrate  12.5 mg Oral BID  . nitroGLYCERIN  0.5 inch Topical Q6H  . pantoprazole  40 mg Oral Daily  . sodium chloride flush  3 mL Intravenous Q12H   Continuous Infusions: . sodium chloride 10 mL/hr at 10/06/16 1603  . sodium chloride    . sodium chloride 10 mL/hr at 10/08/16 0533  . heparin 1,300 Units/hr (10/09/16 0425)     LOS: 3 days    Time spent: >30 MINS    [facility]  [facility] Pager [phone]  If 7PM-7AM, please contact night-coverage at [email or web address], password [facility] 10/09/2016, 9:11 AM  LOS: 3 days

## 2016-10-09 NOTE — Progress Notes (Signed)
ANTICOAGULATION CONSULT NOTE - Follow Up Consult  Pharmacy Consult for Heparin Indication: chest pain/ACS  Allergies  Allergen Reactions  . Levofloxacin Nausea And Vomiting    Other reaction(s): Confusion  . Gabapentin Other (See Comments)    Disoriented, no strength in legs    Patient Measurements: Height: 5\' 6"  (167.6 cm) Weight: 147 lb 9.6 oz (67 kg) IBW/kg (Calculated) : 59.3 Heparin Dosing Weight: 67 kg  Vital Signs: Temp: 98.3 F (36.8 C) (05/30 0425) Temp Source: Oral (05/30 0425) BP: 141/78 (05/30 0851) Pulse Rate: 90 (05/30 0851)  Labs:  Recent Labs  10/06/16 1517 10/06/16 2031  10/07/16 0233  10/07/16 2010 10/07/16 2026 10/08/16 0634 10/09/16 0428  HGB  --   --   < > 7.1*  --  8.1*  --  8.2* 8.2*  HCT  --   --   < > 24.1*  --  26.4*  --  27.0* 27.1*  PLT  --   --   < > 573*  --  541*  --  513* 538*  LABPROT  --   --   --   --   --  15.2  --   --   --   INR  --   --   --   --   --  1.19  --   --   --   HEPARINUNFRC  --   --   < >  --   < >  --  0.35 0.33 0.36  CREATININE  --   --   < > 1.85*  --  2.01*  --  1.86* 1.83*  TROPONINI 0.83* 0.79*  --  0.79*  --   --   --   --   --   < > = values in this interval not displayed.  Estimated Creatinine Clearance: 25.2 mL/min (A) (by C-G formula based on SCr of 1.83 mg/dL (H)).   Medications:  Scheduled:  . amLODipine  5 mg Oral Daily  . aspirin  81 mg Oral Daily  . atorvastatin  40 mg Oral q1800  . cyclobenzaprine  5 mg Oral TID  . docusate sodium  100 mg Oral BID  . insulin aspart  0-5 Units Subcutaneous QHS  . insulin aspart  0-9 Units Subcutaneous TID WC  . insulin glargine  10 Units Subcutaneous QHS  . isosorbide mononitrate  15 mg Oral Daily  . mouth rinse  15 mL Mouth Rinse BID  . metoprolol tartrate  12.5 mg Oral BID  . nitroGLYCERIN  0.5 inch Topical Q6H  . pantoprazole  40 mg Oral Daily  . sodium chloride flush  3 mL Intravenous Q12H   Infusions:  . sodium chloride 10 mL/hr at 10/06/16 1603   . sodium chloride    . sodium chloride 10 mL/hr at 10/08/16 0533  . heparin 1,300 Units/hr (10/09/16 0425)    Assessment: 76 yo F presented from OSH with CP.  Continues on therapeutic heparin at 1300 units/hr.  Cath planned for today.  Goal of Therapy:  Heparin level 0.3-0.7 units/ml Monitor platelets by anticoagulation protocol: Yes   Plan:  Continue heparin at 1300 units/hr Follow-up after cath  [facility], Pharm.D., BCPS Clinical Pharmacist Pager: [phone] Clinical phone for 10/09/2016 from 8:30-4:00 is [phone]. After 4pm, please call Main Rx [phone]) for assistance. 10/09/2016 12:08 PM

## 2016-10-09 NOTE — Progress Notes (Signed)
Pt has remained CP free throughout the remainder of the night. Pt has rested well and VS remain stable. For possible cath today if Cr stable. Will continue to monitor. Dierdre Highman, RN

## 2016-10-10 LAB — GLUCOSE, CAPILLARY
Glucose-Capillary: 180 mg/dL — ABNORMAL HIGH (ref 65–99)
Glucose-Capillary: 176 mg/dL — ABNORMAL HIGH (ref 65–99)
Glucose-Capillary: 235 mg/dL — ABNORMAL HIGH (ref 65–99)
Glucose-Capillary: 229 mg/dL — ABNORMAL HIGH (ref 65–99)

## 2016-10-10 LAB — CBC
HCT: 25.1 % — ABNORMAL LOW (ref 36.0–46.0)
HCT: 31.9 % — ABNORMAL LOW (ref 36.0–46.0)
Hemoglobin: 7.5 g/dL — ABNORMAL LOW (ref 12.0–15.0)
Hemoglobin: 9.9 g/dL — ABNORMAL LOW (ref 12.0–15.0)
MCH: 24.4 pg — ABNORMAL LOW (ref 26.0–34.0)
MCH: 25.8 pg — ABNORMAL LOW (ref 26.0–34.0)
MCHC: 29.9 g/dL — ABNORMAL LOW (ref 30.0–36.0)
MCHC: 31 g/dL (ref 30.0–36.0)
MCV: 81.5 fL (ref 78.0–100.0)
MCV: 83.1 fL (ref 78.0–100.0)
Platelets: 478 10*3/uL — ABNORMAL HIGH (ref 150–400)
Platelets: 499 10*3/uL — ABNORMAL HIGH (ref 150–400)
RBC: 3.08 MIL/uL — ABNORMAL LOW (ref 3.87–5.11)
RBC: 3.84 MIL/uL — ABNORMAL LOW (ref 3.87–5.11)
RDW: 19.8 % — ABNORMAL HIGH (ref 11.5–15.5)
RDW: 19.3 % — ABNORMAL HIGH (ref 11.5–15.5)
WBC: 7.3 10*3/uL (ref 4.0–10.5)
WBC: 9.5 10*3/uL (ref 4.0–10.5)

## 2016-10-10 LAB — BASIC METABOLIC PANEL
Anion gap: 10 (ref 5–15)
BUN: 36 mg/dL — ABNORMAL HIGH (ref 6–20)
CO2: 19 mmol/L — ABNORMAL LOW (ref 22–32)
Calcium: 9 mg/dL (ref 8.9–10.3)
Chloride: 107 mmol/L (ref 101–111)
Creatinine, Ser: 1.53 mg/dL — ABNORMAL HIGH (ref 0.44–1.00)
GFR calc Af Amer: 38 mL/min — ABNORMAL LOW (ref >60)
GFR calc non Af Amer: 32 mL/min — ABNORMAL LOW (ref >60)
Glucose, Bld: 192 mg/dL — ABNORMAL HIGH (ref 65–99)
Potassium: 4.4 mmol/L (ref 3.5–5.1)
Sodium: 136 mmol/L (ref 135–145)

## 2016-10-10 LAB — HEPARIN LEVEL (UNFRACTIONATED)
Heparin Unfractionated: 0.25 IU/mL — ABNORMAL LOW (ref 0.30–0.70)
Heparin Unfractionated: 0.45 IU/mL (ref 0.30–0.70)

## 2016-10-10 LAB — OCCULT BLOOD X 1 CARD TO LAB, STOOL: Fecal Occult Bld: NEGATIVE

## 2016-10-10 LAB — PREPARE RBC (CROSSMATCH)

## 2016-10-10 MED ORDER — SODIUM CHLORIDE 0.9 % IV SOLN
Freq: Once | INTRAVENOUS | Status: AC
  Administered 2016-10-10: 11:00:00 via INTRAVENOUS

## 2016-10-10 MED ORDER — FUROSEMIDE 10 MG/ML IJ SOLN
20.0000 mg | Freq: Once | INTRAMUSCULAR | Status: AC
  Administered 2016-10-10: 20 mg via INTRAVENOUS
  Filled 2016-10-10: qty 2

## 2016-10-10 NOTE — Consult Note (Signed)
   [facility] CM Inpatient Consult   10/10/2016  WRENLY GILLEAN 11/12/1940 [id number]    [facility] Care Management follow up. Chart reviewed. Noted cardiac cath was not done yesterday due to patient becoming dyspneic on cath table. Will continue to follow along. Will make appropriate [facility] referral closer to hospital discharge.    Raiford Noble, MSN-Ed, RN,BSN [facility] Liaison [phone]

## 2016-10-10 NOTE — Progress Notes (Signed)
Progress Note  Patient Name: Emily Miranda Date of Encounter: 10/10/2016  Primary Cardiologist: Hochrein  Subjective   Breathing is better this morning.   Inpatient Medications    Scheduled Meds: . amLODipine  5 mg Oral Daily  . aspirin  81 mg Oral Daily  . atorvastatin  40 mg Oral q1800  . cyclobenzaprine  5 mg Oral TID  . docusate sodium  100 mg Oral BID  . furosemide  20 mg Intravenous Once  . furosemide  40 mg Intravenous BID  . insulin aspart  0-5 Units Subcutaneous QHS  . insulin aspart  0-9 Units Subcutaneous TID WC  . insulin glargine  10 Units Subcutaneous QHS  . isosorbide mononitrate  15 mg Oral Daily  . mouth rinse  15 mL Mouth Rinse BID  . metoprolol tartrate  12.5 mg Oral BID  . nitroGLYCERIN  0.5 inch Topical Q6H  . pantoprazole  40 mg Oral Daily   Continuous Infusions: . sodium chloride 10 mL/hr at 10/06/16 1603  . sodium chloride    . heparin 1,450 Units/hr (10/10/16 0535)   PRN Meds: acetaminophen, ALPRAZolam, artificial tears, fluticasone, nitroGLYCERIN, ondansetron (ZOFRAN) IV, polyethylene glycol, traMADol   Vital Signs    Vitals:   10/09/16 0851 10/09/16 1443 10/09/16 2110 10/10/16 0516  BP: (!) 141/78  (!) 153/86 139/78  Pulse: 90  (!) 109 (!) 102  Resp:   (!) 21 (!) 25  Temp:   97.6 F (36.4 C) 98.3 F (36.8 C)  TempSrc:   Oral Oral  SpO2:  (!) 84%  96%  Weight:    146 lb 14.4 oz (66.6 kg)  Height:        Intake/Output Summary (Last 24 hours) at 10/10/16 0858 Last data filed at 10/10/16 0370  Gross per 24 hour  Intake           465.98 ml  Output             1450 ml  Net          -984.02 ml   Filed Weights   10/08/16 0413 10/09/16 0425 10/10/16 0516  Weight: 144 lb 13.5 oz (65.7 kg) 147 lb 9.6 oz (67 kg) 146 lb 14.4 oz (66.6 kg)    Telemetry    SR - Personally Reviewed  ECG    N/a - Personally Reviewed  Physical Exam   General: Well developed, well nourished, female appearing in no acute distress. Head:  Normocephalic, atraumatic.  Neck: Supple without bruits, JVD. Lungs:  Resp regular and unlabored, Diminished bilaterally in lower lobes. Heart: RRR, S1, S2, no S3, S4, soft murmur; no rub. Abdomen: Soft, non-tender, non-distended with normoactive bowel sounds. No hepatomegaly. No rebound/guarding. No obvious abdominal masses. Extremities: No clubbing, cyanosis, 1+ LE edema R>L (slightly worse this morning). Distal pedal pulses are 2+ bilaterally. Neuro: Alert and oriented X 3. Moves all extremities spontaneously. Psych: Normal affect.  Labs    Chemistry Recent Labs Lab 10/08/16 0634 10/09/16 0428 10/10/16 0533  NA 136 135 136  K 4.3 4.7 4.4  CL 109 107 107  CO2 15* 18* 19*  GLUCOSE 149* 220* 192*  BUN 41* 39* 36*  CREATININE 1.86* 1.83* 1.53*  CALCIUM 8.8* 8.8* 9.0  PROT 6.5 6.7  --   ALBUMIN 2.0* 1.9*  --   AST 14* 13*  --   ALT 11* 8*  --   ALKPHOS 90 91  --   BILITOT 0.4 0.2*  --   GFRNONAA 26*  26* 32*  GFRAA 30* 30* 38*  ANIONGAP 12 10 10      Hematology Recent Labs Lab 10/08/16 1610 10/09/16 0428 10/10/16 0319  WBC 7.7 7.3 7.3  RBC 3.32* 3.30* 3.08*  HGB 8.2* 8.2* 7.5*  HCT 27.0* 27.1* 25.1*  MCV 81.3 82.1 81.5  MCH 24.7* 24.8* 24.4*  MCHC 30.4 30.3 29.9*  RDW 19.2* 19.9* 19.8*  PLT 513* 538* 478*    Cardiac Enzymes Recent Labs Lab 10/06/16 1517 10/06/16 2031 10/07/16 0233  TROPONINI 0.83* 0.79* 0.79*   No results for input(s): TROPIPOC in the last 168 hours.   BNPNo results for input(s): BNP, PROBNP in the last 168 hours.   DDimer No results for input(s): DDIMER in the last 168 hours.    Radiology    No results found.  Cardiac Studies   N/A  Patient Profile     76 y.o. female with history of mild to moderate aortic stenosis, hypertension, diabetes and mixed systolic and diastolic heart failure. She also has moderate renal insufficiency. She was admitted with unstable angina. Her enzymes are low and flat. She is on IV  heparin.  Assessment & Plan    1. NSTEMI:presented with chest pain. Currently on IV heparin. Attempted to take down for cath yesterday, but became dyspneic on the cath table. Unable to do cath. Sent back to unit and restarted on IV lasix. No chest pain this morning. -- continue nitro paste, BB, ASA, statin and IV heparin -- plan for cath once respiratory status improves.   2. Acute combined diastolic and systolic CHF:1.4L UOP yesterday, weight down to 146 lb today.  -- continue IV lasix  -- Cr actually improved. Daily BMET -- daily weights, and I&Os  3. Anemia:Received 1 unit PRBCs 10/07/16 with improvement in Hgb from 7.1>>8.2. Fall in Hgb again today to 7.5. Planned for additional unit of PRBCs today. Continues to deny any bleeding. Followed as outpatient for Aranesp shots, but has recently missed 2 doses due to being hospitalized.  -- daily CBC -- [facility] following, appreciate assistance  4. CKD with AKI:Cr improve today. -- follow BMET  5. DM:on SSI   Signed, Laverda Page, NP  10/10/2016, 8:58 AM    I have personally seen and examined this patient with Laverda Page, NP. I agree with the assessment and plan as outlined above. She is still volume overloaded. Will continue diuresis with IV Lasix today. Hopefully can get her in position for cardiac cath over next several days. H/H still falling without active bleeding. Likely chronic anemia. Primary team planning for transfusion today.   Verne Carrow 10/10/2016 9:53 AM

## 2016-10-10 NOTE — Progress Notes (Signed)
ANTICOAGULATION CONSULT NOTE - Follow Up Consult  Pharmacy Consult for Heparin Indication: chest pain/ACS  Allergies  Allergen Reactions  . Levofloxacin Nausea And Vomiting    Other reaction(s): Confusion  . Gabapentin Other (See Comments)    Disoriented, no strength in legs    Patient Measurements: Height: 5\' 6"  (167.6 cm) Weight: 147 lb 9.6 oz (67 kg) IBW/kg (Calculated) : 59.3 Heparin Dosing Weight: 67 kg  Vital Signs: Temp: 97.6 F (36.4 C) (05/30 2110) Temp Source: Oral (05/30 2110) BP: 153/86 (05/30 2110) Pulse Rate: 109 (05/30 2110)  Labs:  Recent Labs  10/07/16 2010  10/08/16 0634 10/09/16 0428 10/10/16 0319  HGB 8.1*  --  8.2* 8.2* 7.5*  HCT 26.4*  --  27.0* 27.1* 25.1*  PLT 541*  --  513* 538* 478*  LABPROT 15.2  --   --   --   --   INR 1.19  --   --   --   --   HEPARINUNFRC  --   < > 0.33 0.36 0.25*  CREATININE 2.01*  --  1.86* 1.83*  --   < > = values in this interval not displayed.  Estimated Creatinine Clearance: 25.2 mL/min (A) (by C-G formula based on SCr of 1.83 mg/dL (H)).  Assessment: 76 yo F presented from OSH with CP.  Continues on heparin gtt. Heparin level down to subtherapeutic (0.25) on gtt at 1300 units/hr. Hgb down to 7.5 (low but relatively stable). No issues with line or bleeding reported per RN. Plan for cath in 1-2 days  Goal of Therapy:  Heparin level 0.3-0.7 units/ml Monitor platelets by anticoagulation protocol: Yes   Plan:  Increase heparin to 1450 units/hr Will f/u 8 hr heparin level  Christoper Fabian, PharmD, BCPS Clinical pharmacist, pager [phone] 10/10/2016 4:37 AM

## 2016-10-10 NOTE — Evaluation (Signed)
Physical Therapy Evaluation Patient Details Name: Emily Miranda MRN: [id number] DOB: May 13, 1941 Today's Date: 10/10/2016   History of Present Illness  76 yo admitted with CP and NSTEMI, unable to cath 5/30 due to SOB supine. PMhx: DM, HTN, AS, HF, anemia, charcot foot  Clinical Impression  Pt pleasant with spouse and son present. Pt and spouse state pt has been dealing with non-healing wound to her foot for 2 years and because of this she has not been walking other than to bathroom, is pushed in a transport chair everywhere, is assisted with bathing and has only begun recently to get in tub again. Pt is unaware of exact restrictions for distance on her foot and has not yet been fitted for orthopedic shoes. Pt currently at baseline functional status with supervision needed for safety and cues for position in RW. Given pt current limitations acute mobility with nursing is appropriate with potential HHPT at D/C if cleared by her podiatrist for mobility. No further needs at this time with spouse and pt aware and agreeable.   BP 134/76 with gait despite pt reported dizziness HR 87-91 sats 93% on 3L    Follow Up Recommendations Home health PT (if cleared for mobility by podiatrist)    Equipment Recommendations  Other (comment) (tub bench)    Recommendations for Other Services       Precautions / Restrictions Precautions Precautions: Fall Precaution Comments: pt reports wound to right foot has just healed and she is not allowed to walk more than short household distance. Hasn't walked more than 20' in 2 years Restrictions RLE Weight Bearing: Weight bearing as tolerated      Mobility  Bed Mobility               General bed mobility comments: EOB on arrival  Transfers Overall transfer level: Modified independent                  Ambulation/Gait Ambulation/Gait assistance: Supervision Ambulation Distance (Feet): 40 Feet Assistive device: Rolling walker (2  wheeled) Gait Pattern/deviations: Step-through pattern;Decreased stride length   Gait velocity interpretation: Below normal speed for age/gender General Gait Details: cues for position in RW. Pt reported lightheadeness with gait, BP stable and cues for eyes open  Stairs            Wheelchair Mobility    Modified Rankin (Stroke Patients Only)       Balance Overall balance assessment: Needs assistance   Sitting balance-Leahy Scale: Good       Standing balance-Leahy Scale: Fair                               Pertinent Vitals/Pain Pain Assessment: No/denies pain    Home Living Family/patient expects to be discharged to:: Private residence Living Arrangements: Spouse/significant other Available Help at Discharge: Family;Available 24 hours/day Type of Home: House Home Access: Ramped entrance     Home Layout: One level Home Equipment: Walker - 2 wheels;Transport chair      Prior Function Level of Independence: Needs assistance   Gait / Transfers Assistance Needed: supervision for transfers, walks to bathroom and back, transports chair for all other mobility  ADL's / Homemaking Assistance Needed: spouse does all the homemaking, assists with transfers into tub and assists with bathing. Was sponge bathing until recently due to wound        Hand Dominance        Extremity/Trunk Assessment  Upper Extremity Assessment Upper Extremity Assessment: Generalized weakness    Lower Extremity Assessment Lower Extremity Assessment: Generalized weakness    Cervical / Trunk Assessment Cervical / Trunk Assessment: Normal  Communication      Cognition Arousal/Alertness: Awake/alert Behavior During Therapy: WFL for tasks assessed/performed Overall Cognitive Status: Within Functional Limits for tasks assessed                                        General Comments      Exercises     Assessment/Plan    PT Assessment All further PT  needs can be met in the next venue of care  PT Problem List Decreased strength;Decreased mobility;Decreased activity tolerance;Decreased balance;Decreased knowledge of use of DME       PT Treatment Interventions      PT Goals (Current goals can be found in the Care Plan section)  Acute Rehab PT Goals PT Goal Formulation: All assessment and education complete, DC therapy    Frequency     Barriers to discharge        Co-evaluation               AM-PAC PT "6 Clicks" Daily Activity  Outcome Measure Difficulty turning over in bed (including adjusting bedclothes, sheets and blankets)?: None Difficulty moving from lying on back to sitting on the side of the bed? : None Difficulty sitting down on and standing up from a chair with arms (e.g., wheelchair, bedside commode, etc,.)?: None Help needed moving to and from a bed to chair (including a wheelchair)?: A Little Help needed walking in hospital room?: A Little Help needed climbing 3-5 steps with a railing? : A Lot 6 Click Score: 20    End of Session Equipment Utilized During Treatment: Gait belt;Oxygen Activity Tolerance: Patient tolerated treatment well Patient left: in bed;with call bell/phone within reach;with nursing/sitter in room;with family/visitor present (EoB per pt request who denied chair) Nurse Communication: Mobility status PT Visit Diagnosis: Difficulty in walking, not elsewhere classified (R26.2)    Time: 1310-1341 PT Time Calculation (min) (ACUTE ONLY): 31 min   Charges:   PT Evaluation $PT Eval Moderate Complexity: 1 Procedure     PT G Codes:        Elwyn Reach, PT [phone]   [place] 10/10/2016, 2:02 PM

## 2016-10-10 NOTE — Progress Notes (Signed)
[facility] PROGRESS NOTE  Emily Miranda ELT:[id number] DOB: 02-02-1941 DOA: 10/06/2016   PCP: Noni Saupe, MD     Assessment/Plan: Principal Problem:   Non-ST elevation (NSTEMI) myocardial infarction [facility]) Active Problems:   DM type 2 causing CKD stage 3 (HCC)   Mild aortic stenosis   CHEST PAIN   HTN (hypertension)   Shortness of breath   Acute combined systolic and diastolic congestive heart failure (HCC)   Chronic renal insufficiency, stage 3 (moderate)   Chronic diabetic ulcer of right foot    NSTEMI (non-ST elevated myocardial infarction) (HCC)   76 y.o. female with past history of diastolic CHF, moderate aortic stenosis, hypertension, diabetes recently started on insulin, chronic anemia, neuropathy, chronic kidney disease with baseline creatinine of 1.5-1.8 admitted today from [facility] emergency room for  NSTEMI and CHF. [facility] consulted for evaluation of anemia and diabetes on insulin. Patient reports having some kidney disease for a few years now is not followed by a nephrologist. She has also been noted to have anemia for several months, few months ago she saw a hematologist in [place] Dr. Jola Babinski and was subsequently started on Aranesp injections, of which she has had 2 or 3 in the recent past. She denies any blood in her stools or black stools, she is up-to-date on colonoscopies and her last one was about 3 years ago, which was reportedly normal.    Assessment and plan DM on Insulin, Accu-Chek stable -continue lantus  , SSI Hemoglobin A1c 11.3  Anemia-baseline around 9  suspect multifactorial  from CKD primarily, clinically volume overloaded on exam too, so some dilutional component possible too Anemia panel consistent with anemia of chronic disease -hemoccult stool pending,constipated  -was getting Aranesp shots from Dr.Quincy Lewis's office -uptodate on colonoscopies Continue PPI for GI prophylaxis Hemoglobin 8.2 after receiving 1  unit of packed red blood cells 5/28, hemoglobin dropped again to 7.5, will transfuse second unit on 5/31   CKD 3 -stable, baseline 1.5-1.8, creatinine is around 1.86 today    NSTEMI/CHF-EF 40-45% -per Cards -lasix/heparin/ plan for LHC depending on clinical progress and Creatinine, cardiac cath as indicated by delayed due to renal function, volume overload which is being managed by cardiology   Leg cramps  Has a hx of neuropathy Started flexeril for leg cramps  Patient states that she is allergic to gabapentin     Abnormal TSH:  Both TSH and free T4 are borderline high, check T3    Code Status:  Full code    Family Communication: Discussed in detail with the patient, all imaging results, lab results explained to the patient   Disposition Plan:   As per  cardiology,HHPT      Consultants:  Cardiology, TR[facility]  Procedures:  None  Antibiotics: Anti-infectives    None         HPI/Subjective:  Patient denies any shortness of breath today  Objective: Vitals:   10/09/16 0851 10/09/16 1443 10/09/16 2110 10/10/16 0516  BP: (!) 141/78  (!) 153/86 139/78  Pulse: 90  (!) 109 (!) 102  Resp:   (!) 21 (!) 25  Temp:   97.6 F (36.4 C) 98.3 F (36.8 C)  TempSrc:   Oral Oral  SpO2:  (!) 84%  96%  Weight:    66.6 kg (146 lb 14.4 oz)  Height:        Intake/Output Summary (Last 24 hours) at 10/10/16 0832 Last data filed at 10/10/16 [phone]  Gross per 24 hour  Intake           465.98 ml  Output             1450 ml  Net          -984.02 ml    Exam:  Examination:  General exam: Appears calm and comfortable  Respiratory system: Clear to auscultation. Respiratory effort normal. Cardiovascular system: S1 & S2 heard, RRR. No JVD, murmurs, rubs, gallops or clicks. No pedal edema. Gastrointestinal system: Abdomen is nondistended, soft and nontender. No organomegaly or masses felt. Normal bowel sounds heard. Central nervous system: Alert and oriented. No focal  neurological deficits. Extremities: Symmetric 5 x 5 power. Skin: No rashes, lesions or ulcers Psychiatry: Judgement and insight appear normal. Mood & affect appropriate.     Data Reviewed: I have personally reviewed following labs and imaging studies  Micro Results No results found for this or any previous visit (from the past 240 hour(s)).  Radiology Reports Dg Chest Port 1 View  Result Date: 10/06/2016 CLINICAL DATA:  Shortness of breath with left-sided chest pain. History of hypertension, diabetes and congestive heart failure. EXAM: PORTABLE CHEST 1 VIEW COMPARISON:  Radiographs 10/06/2016.  CT 08/01/2016. FINDINGS: 1655 hour. The heart size and mediastinal contours are stable. There are small bilateral pleural effusions with mild bibasilar atelectasis, vascular congestion and probable mild edema. No confluent airspace opacity or pneumothorax. No acute osseous findings are seen. Telemetry leads overlie the chest. IMPRESSION: Probable mild congestive heart failure with mild edema and small bilateral pleural effusions. Electronically Signed   By: Carey Bullocks M.D.   On: 10/06/2016 17:03     CBC  Recent Labs Lab 10/07/16 0233 10/07/16 2010 10/08/16 0634 10/09/16 0428 10/10/16 0319  WBC 8.9 8.5 7.7 7.3 7.3  HGB 7.1* 8.1* 8.2* 8.2* 7.5*  HCT 24.1* 26.4* 27.0* 27.1* 25.1*  PLT 573* 541* 513* 538* 478*  MCV 79.3 80.5 81.3 82.1 81.5  MCH 23.4* 24.7* 24.7* 24.8* 24.4*  MCHC 29.5* 30.7 30.4 30.3 29.9*  RDW 19.4* 18.9* 19.2* 19.9* 19.8*    Chemistries   Recent Labs Lab 10/06/16 1517 10/07/16 0233 10/07/16 2010 10/08/16 0634 10/09/16 0428 10/10/16 0533  NA  --  136 133* 136 135 136  K  --  4.4 4.5 4.3 4.7 4.4  CL  --  109 105 109 107 107  CO2  --  17* 16* 15* 18* 19*  GLUCOSE  --  154* 243* 149* 220* 192*  BUN  --  39* 42* 41* 39* 36*  CREATININE  --  1.85* 2.01* 1.86* 1.83* 1.53*  CALCIUM  --  8.6* 8.5* 8.8* 8.8* 9.0  MG 2.1  --   --   --   --   --   AST  --   --    --  14* 13*  --   ALT  --   --   --  11* 8*  --   ALKPHOS  --   --   --  90 91  --   BILITOT  --   --   --  0.4 0.2*  --    ------------------------------------------------------------------------------------------------------------------ estimated creatinine clearance is 30.2 mL/min (A) (by C-G formula based on SCr of 1.53 mg/dL (H)). ------------------------------------------------------------------------------------------------------------------ No results for input(s): HGBA1C in the last 72 hours. ------------------------------------------------------------------------------------------------------------------ No results for input(s): CHOL, HDL, LDLCALC, TRIG, CHOLHDL, LDLDIRECT in the last 72 hours. ------------------------------------------------------------------------------------------------------------------ No results for input(s): TSH, T4TOTAL, T3FREE, THYROIDAB in the last  72 hours.  Invalid input(s): FREET3 ------------------------------------------------------------------------------------------------------------------ No results for input(s): VITAMINB12, FOLATE, FERRITIN, TIBC, IRON, RETICCTPCT in the last 72 hours.  Coagulation profile  Recent Labs Lab 10/07/16 2010  INR 1.19    No results for input(s): DDIMER in the last 72 hours.  Cardiac Enzymes  Recent Labs Lab 10/06/16 1517 10/06/16 2031 10/07/16 0233  TROPONINI 0.83* 0.79* 0.79*   ------------------------------------------------------------------------------------------------------------------ Invalid input(s): POCBNP   CBG:  Recent Labs Lab 10/09/16 0755 10/09/16 1136 10/09/16 1645 10/09/16 2104 10/10/16 0732  GLUCAP 200* 121* 127* 288* 180*       Studies: No results found.    Lab Results  Component Value Date   HGBA1C 11.3 (H) 10/06/2016   HGBA1C 12.5 (H) 07/29/2016   Lab Results  Component Value Date   LDLCALC 72 10/07/2016   CREATININE 1.53 (H) 10/10/2016        Scheduled Meds: . amLODipine  5 mg Oral Daily  . aspirin  81 mg Oral Daily  . atorvastatin  40 mg Oral q1800  . cyclobenzaprine  5 mg Oral TID  . docusate sodium  100 mg Oral BID  . furosemide  20 mg Intravenous Once  . furosemide  40 mg Intravenous BID  . insulin aspart  0-5 Units Subcutaneous QHS  . insulin aspart  0-9 Units Subcutaneous TID WC  . insulin glargine  10 Units Subcutaneous QHS  . isosorbide mononitrate  15 mg Oral Daily  . mouth rinse  15 mL Mouth Rinse BID  . metoprolol tartrate  12.5 mg Oral BID  . nitroGLYCERIN  0.5 inch Topical Q6H  . pantoprazole  40 mg Oral Daily   Continuous Infusions: . sodium chloride 10 mL/hr at 10/06/16 1603  . sodium chloride    . heparin 1,450 Units/hr (10/10/16 0535)     LOS: 4 days    Time spent: >30 MINS    Richarda Overlie  [facility] Pager [phone]. If 7PM-7AM, please contact night-coverage at [email or web address], password [facility] 10/10/2016, 8:32 AM  LOS: 4 days

## 2016-10-10 NOTE — Care Management Important Message (Signed)
Important Message  Patient Details  Name: Emily Miranda MRN: [id number] Date of Birth: 1941-05-10   Medicare Important Message Given:  Yes    Kyla Balzarine 10/10/2016, 11:29 AM

## 2016-10-10 NOTE — Progress Notes (Signed)
ANTICOAGULATION CONSULT NOTE - Follow Up Consult  Pharmacy Consult for Heparin Indication: chest pain/ACS  Allergies  Allergen Reactions  . Levofloxacin Nausea And Vomiting    Other reaction(s): Confusion  . Gabapentin Other (See Comments)    Disoriented, no strength in legs    Patient Measurements: Height: 5\' 6"  (167.6 cm) Weight: 146 lb 14.4 oz (66.6 kg) IBW/kg (Calculated) : 59.3 Heparin Dosing Weight: 67 kg  Vital Signs: Temp: 97.5 F (36.4 C) (05/31 1330) Temp Source: Oral (05/31 1330) BP: 135/75 (05/31 1330) Pulse Rate: 91 (05/31 1357)  Labs:  Recent Labs  10/07/16 2010  10/08/16 1216 10/09/16 0428 10/10/16 0319 10/10/16 0533 10/10/16 1554  HGB 8.1*  --  8.2* 8.2* 7.5*  --  9.9*  HCT 26.4*  --  27.0* 27.1* 25.1*  --  31.9*  PLT 541*  --  513* 538* 478*  --  499*  LABPROT 15.2  --   --   --   --   --   --   INR 1.19  --   --   --   --   --   --   HEPARINUNFRC  --   < > 0.33 0.36 0.25*  --  0.45  CREATININE 2.01*  --  1.86* 1.83*  --  1.53*  --   < > = values in this interval not displayed.  Estimated Creatinine Clearance: 30.2 mL/min (A) (by C-G formula based on SCr of 1.53 mg/dL (H)).  Assessment: 76 yo F presented from OSH with CP.  Continues on heparin gtt. Heparin level down to subtherapeutic (0.25) on gtt at 1300 units/hr. Hgb down to 7.5 (low but relatively stable). No issues with line or bleeding reported per RN. Plan for cath in 1-2 days  Repeat HL is now therapeutic at 0.45 on heparin 1450 units/hr. No issues with infusion or bleeding noted.  Goal of Therapy:  Heparin level 0.3-0.7 units/ml Monitor platelets by anticoagulation protocol: Yes   Plan:  Continue heparin 1450 units/hr Daily HL/CBC Monitor s/sx of bleeding F/u on plan for cath  [facility]. Newman Pies, PharmD, BCPS Clinical Pharmacist [phone] 10/10/2016 5:28 PM

## 2016-10-11 ENCOUNTER — Encounter (HOSPITAL_COMMUNITY): Payer: Self-pay | Admitting: Internal Medicine

## 2016-10-11 ENCOUNTER — Encounter (HOSPITAL_COMMUNITY)
Admission: AD | Disposition: A | Discharge status: Home Health | Payer: Self-pay | Source: Other Acute Inpatient Hospital | Attending: Internal Medicine

## 2016-10-11 DIAGNOSIS — I2510 Atherosclerotic heart disease of native coronary artery without angina pectoris: Secondary | ICD-10-CM

## 2016-10-11 DIAGNOSIS — J189 Pneumonia, unspecified organism: Secondary | ICD-10-CM

## 2016-10-11 HISTORY — PX: LEFT HEART CATH AND CORONARY ANGIOGRAPHY: CATH118249

## 2016-10-11 HISTORY — DX: Pneumonia, unspecified organism: J18.9

## 2016-10-11 LAB — GLUCOSE, CAPILLARY
Glucose-Capillary: 140 mg/dL — ABNORMAL HIGH (ref 65–99)
Glucose-Capillary: 109 mg/dL — ABNORMAL HIGH (ref 65–99)
Glucose-Capillary: 255 mg/dL — ABNORMAL HIGH (ref 65–99)
Glucose-Capillary: 233 mg/dL — ABNORMAL HIGH (ref 65–99)

## 2016-10-11 LAB — COMPREHENSIVE METABOLIC PANEL
ALT: UNDETERMINED U/L (ref 14–54)
AST: 14 U/L — ABNORMAL LOW (ref 15–41)
Albumin: 1.9 g/dL — ABNORMAL LOW (ref 3.5–5.0)
Alkaline Phosphatase: 83 U/L (ref 38–126)
Anion gap: 10 (ref 5–15)
BUN: 33 mg/dL — ABNORMAL HIGH (ref 6–20)
CO2: 16 mmol/L — ABNORMAL LOW (ref 22–32)
Calcium: 8.8 mg/dL — ABNORMAL LOW (ref 8.9–10.3)
Chloride: 109 mmol/L (ref 101–111)
Creatinine, Ser: 1.53 mg/dL — ABNORMAL HIGH (ref 0.44–1.00)
GFR calc Af Amer: 38 mL/min — ABNORMAL LOW (ref >60)
GFR calc non Af Amer: 32 mL/min — ABNORMAL LOW (ref >60)
Glucose, Bld: 157 mg/dL — ABNORMAL HIGH (ref 65–99)
Potassium: 4.3 mmol/L (ref 3.5–5.1)
Sodium: 135 mmol/L (ref 135–145)
Total Bilirubin: UNDETERMINED mg/dL (ref 0.3–1.2)
Total Protein: 6.5 g/dL (ref 6.5–8.1)

## 2016-10-11 LAB — TYPE AND SCREEN
ABO/RH(D): O POS
Antibody Screen: NEGATIVE
Unit division: 0
Unit division: 0

## 2016-10-11 LAB — CBC
HCT: 31.1 % — ABNORMAL LOW (ref 36.0–46.0)
Hemoglobin: 9.4 g/dL — ABNORMAL LOW (ref 12.0–15.0)
MCH: 25.1 pg — ABNORMAL LOW (ref 26.0–34.0)
MCHC: 30.2 g/dL (ref 30.0–36.0)
MCV: 83.2 fL (ref 78.0–100.0)
Platelets: 425 10*3/uL — ABNORMAL HIGH (ref 150–400)
RBC: 3.74 MIL/uL — ABNORMAL LOW (ref 3.87–5.11)
RDW: 19.5 % — ABNORMAL HIGH (ref 11.5–15.5)
WBC: 7.9 10*3/uL (ref 4.0–10.5)

## 2016-10-11 LAB — BPAM RBC
Blood Product Expiration Date: 201806212359
Blood Product Expiration Date: 201806252359
ISSUE DATE / TIME: 201805281520
ISSUE DATE / TIME: 201805311030
Unit Type and Rh: 5100
Unit Type and Rh: 5100

## 2016-10-11 LAB — T3, FREE: T3, Free: 2.4 pg/mL (ref 2.0–4.4)

## 2016-10-11 LAB — HEPARIN LEVEL (UNFRACTIONATED): Heparin Unfractionated: 0.47 IU/mL (ref 0.30–0.70)

## 2016-10-11 LAB — ALT: ALT: 10 U/L — ABNORMAL LOW (ref 14–54)

## 2016-10-11 LAB — BILIRUBIN, TOTAL: Total Bilirubin: 0.5 mg/dL (ref 0.3–1.2)

## 2016-10-11 SURGERY — LEFT HEART CATH AND CORONARY ANGIOGRAPHY
Anesthesia: LOCAL

## 2016-10-11 MED ORDER — ISOSORBIDE MONONITRATE ER 30 MG PO TB24
30.0000 mg | ORAL_TABLET | Freq: Every day | ORAL | Status: DC
  Administered 2016-10-11 – 2016-10-12 (×2): 30 mg via ORAL
  Filled 2016-10-11 (×2): qty 1

## 2016-10-11 MED ORDER — SODIUM CHLORIDE 0.9% FLUSH
3.0000 mL | Freq: Two times a day (BID) | INTRAVENOUS | Status: DC
  Administered 2016-10-11: 3 mL via INTRAVENOUS

## 2016-10-11 MED ORDER — SODIUM CHLORIDE 0.9 % IV SOLN: 250.0000 mL | INTRAVENOUS | Status: DC | PRN

## 2016-10-11 MED ORDER — FENTANYL CITRATE (PF) 100 MCG/2ML IJ SOLN
INTRAMUSCULAR | Status: AC
  Filled 2016-10-11: qty 2

## 2016-10-11 MED ORDER — LIDOCAINE HCL 1 % IJ SOLN
INTRAMUSCULAR | Status: AC
  Filled 2016-10-11: qty 20

## 2016-10-11 MED ORDER — MIDAZOLAM HCL 2 MG/2ML IJ SOLN
INTRAMUSCULAR | Status: DC | PRN
  Administered 2016-10-11: 0.5 mg via INTRAVENOUS

## 2016-10-11 MED ORDER — MIDAZOLAM HCL 2 MG/2ML IJ SOLN
INTRAMUSCULAR | Status: AC
  Filled 2016-10-11: qty 2

## 2016-10-11 MED ORDER — SODIUM CHLORIDE 0.9 % IV SOLN
INTRAVENOUS | Status: AC
  Administered 2016-10-11: 13:00:00 via INTRAVENOUS

## 2016-10-11 MED ORDER — IOPAMIDOL (ISOVUE-370) INJECTION 76%
INTRAVENOUS | Status: AC
  Filled 2016-10-11: qty 100

## 2016-10-11 MED ORDER — SODIUM CHLORIDE 0.9% FLUSH: 3.0000 mL | INTRAVENOUS | Status: DC | PRN

## 2016-10-11 MED ORDER — HEPARIN (PORCINE) IN NACL 2-0.9 UNIT/ML-% IJ SOLN
INTRAMUSCULAR | Status: AC | PRN
  Administered 2016-10-11: 1000 mL

## 2016-10-11 MED ORDER — VERAPAMIL HCL 2.5 MG/ML IV SOLN
INTRAVENOUS | Status: DC | PRN
  Administered 2016-10-11: 10 mL via INTRA_ARTERIAL

## 2016-10-11 MED ORDER — LIDOCAINE HCL (PF) 1 % IJ SOLN
INTRAMUSCULAR | Status: DC | PRN
  Administered 2016-10-11: 2 mL via INTRADERMAL

## 2016-10-11 MED ORDER — SODIUM CHLORIDE 0.9 % WEIGHT BASED INFUSION: 1.0000 mL/kg/h | INTRAVENOUS | Status: DC

## 2016-10-11 MED ORDER — HEPARIN SODIUM (PORCINE) 1000 UNIT/ML IJ SOLN
INTRAMUSCULAR | Status: DC | PRN
  Administered 2016-10-11: 3500 [IU] via INTRAVENOUS

## 2016-10-11 MED ORDER — IOPAMIDOL (ISOVUE-370) INJECTION 76%
INTRAVENOUS | Status: DC | PRN
  Administered 2016-10-11: 40 mL via INTRA_ARTERIAL

## 2016-10-11 MED ORDER — SODIUM CHLORIDE 0.9% FLUSH
3.0000 mL | Freq: Two times a day (BID) | INTRAVENOUS | Status: DC
  Administered 2016-10-11 – 2016-10-15 (×8): 3 mL via INTRAVENOUS

## 2016-10-11 MED ORDER — ASPIRIN 81 MG PO CHEW
81.0000 mg | CHEWABLE_TABLET | ORAL | Status: AC
  Administered 2016-10-11: 81 mg via ORAL
  Filled 2016-10-11: qty 1

## 2016-10-11 MED ORDER — HEPARIN (PORCINE) IN NACL 2-0.9 UNIT/ML-% IJ SOLN
INTRAMUSCULAR | Status: AC
  Filled 2016-10-11: qty 1000

## 2016-10-11 MED ORDER — ASPIRIN 81 MG PO CHEW: 81.0000 mg | CHEWABLE_TABLET | ORAL | Status: DC

## 2016-10-11 MED ORDER — ASPIRIN 81 MG PO CHEW
81.0000 mg | CHEWABLE_TABLET | Freq: Every day | ORAL | Status: DC
  Administered 2016-10-12 – 2016-10-16 (×5): 81 mg via ORAL
  Filled 2016-10-11 (×5): qty 1

## 2016-10-11 MED ORDER — FENTANYL CITRATE (PF) 100 MCG/2ML IJ SOLN
INTRAMUSCULAR | Status: DC | PRN
Start: 2016-10-11 — End: 2016-10-11
  Administered 2016-10-11: 25 ug via INTRAVENOUS

## 2016-10-11 MED ORDER — VERAPAMIL HCL 2.5 MG/ML IV SOLN
INTRAVENOUS | Status: AC
  Filled 2016-10-11: qty 2

## 2016-10-11 MED ORDER — SODIUM CHLORIDE 0.9 % WEIGHT BASED INFUSION: 3.0000 mL/kg/h | INTRAVENOUS | Status: DC

## 2016-10-11 SURGICAL SUPPLY — 10 items
CATH IMPULSE 5F ANG/FL3.5 (CATHETERS) ×1 IMPLANT
DEVICE RAD COMP TR BAND LRG (VASCULAR PRODUCTS) ×1 IMPLANT
GLIDESHEATH SLEND SS 6F .021 (SHEATH) ×1 IMPLANT
GUIDEWIRE INQWIRE 1.5J.035X260 (WIRE) IMPLANT
INQWIRE 1.5J .035X260CM (WIRE) ×2
KIT HEART LEFT (KITS) ×2 IMPLANT
PACK CARDIAC CATHETERIZATION (CUSTOM PROCEDURE TRAY) ×2 IMPLANT
TRANSDUCER W/STOPCOCK (MISCELLANEOUS) ×2 IMPLANT
TUBING CIL FLEX 10 FLL-RA (TUBING) ×2 IMPLANT
WIRE HI TORQ VERSACORE-J 145CM (WIRE) ×1 IMPLANT

## 2016-10-11 NOTE — Interval H&P Note (Signed)
History and Physical Interval Note:  10/11/2016 10:08 AM  Emily Miranda  has presented today for surgery, with the diagnosis of chest pain.  The various methods of treatment have been discussed with the patient and family. After consideration of risks, benefits and other options for treatment, the patient has consented to  Procedure(s): Left Heart Cath and Coronary Angiography (N/A) as a surgical intervention .  The patient's history has been reviewed, patient examined, no change in status, stable for surgery.  I have reviewed the patient's chart and labs.  Questions were answered to the patient's satisfaction.    Cath Lab Visit (complete for each Cath Lab visit)  Clinical Evaluation Leading to the Procedure:   ACS: Yes.    Non-ACS:  N/A  Raymon Schlarb

## 2016-10-11 NOTE — Progress Notes (Signed)
PROGRESS NOTE                                                                                                                                                                                                             Patient Demographics:    Emily Miranda, is a 76 y.o. female, DOB - 25-Jul-1940, ZOX:[id number]  Admit date - 10/06/2016   Admitting Physician Hillis Range, MD  Outpatient Primary MD for the patient is Noni Saupe, MD  LOS - 5  Outpatient Specialists:  No chief complaint on file.      Brief Narrative   76 year old female with history of diastolic CHF, moderate aortic stenosis, hypertension, uncontrolled diabetes mellitus recently started on insulin, neuropathy, chronic kidney disease stage III was transferred to [facility] from [facility] ED for NSTEMI and acute CHF.   Subjective:   Patient seen and examined this morning. Still has shortness of breath and increased leg swellings.   Assessment  & Plan :    Principal Problem:   Non-ST elevation (NSTEMI) myocardial infarction (HCC) EF of 40-45% per echo from March this year. On IV Lasix and heparin. Cardiac cath done today showing three-vessel coronary artery disease including diffuse LAD disease. Cardiology recommends medical therapy given improvement in her chest pain and shortness of breath with diaphoresis and blood transfusion. Continue aspirin, statin, metoprolol and Imdur.    Active Problems: Acute on chronic systolic CHF Echo with EF of 40-45%. Continue IV Lasix. Monitor strict I/O and daily weights. Continue aspirin, beta blocker and Imdur.  Anemia of chronic kidney disease Receives Aranesp injections as outpatient. Noted for dropping hemoglobin to 7.5, improved with 1 unit PRBC.    DM type 2 causing CKD stage 3 (HCC) A1c of 11.3. Continue current Lantus dose sliding scale coverage. Renal function at baseline.   Diabetic  neuropathy with leg cramps Started on Flexeril.  Elevated TSH and free T4 Possibly due to acute illness. Normal free T3. Monitor as outpatient.      Code Status : Full code  Family Communication  : None at bedside  Disposition Plan  : Pending hospital course and symptomatic improvement. PT evaluation  Barriers For Discharge : Active symptoms   Consults  :  Cardiology  Procedures  :  Left and right heart catheterization  DVT Prophylaxis  : IV heparin  Lab Results  Component Value Date   PLT 425 (H) 10/11/2016    Antibiotics  :  None  Anti-infectives    None        Objective:   Vitals:   10/11/16 1033 10/11/16 1038 10/11/16 1043 10/11/16 1048  BP: 129/77 130/84    Pulse: 76 76 78 (!) 0  Resp: 14 (!) 55 12 (!) 52  Temp:      TempSrc:      SpO2: 93% 92% 91% (!) 0%  Weight:      Height:        Wt Readings from Last 3 Encounters:  10/11/16 66.8 kg (147 lb 3.2 oz)  08/27/16 57.2 kg (126 lb 3.2 oz)  08/05/16 62.8 kg (138 lb 8 oz)     Intake/Output Summary (Last 24 hours) at 10/11/16 1341 Last data filed at 10/11/16 1300  Gross per 24 hour  Intake           1468.6 ml  Output              900 ml  Net            568.6 ml     Physical Exam  Gen: not in distress, Appears fatigued HEENT: Moist mucosa, supple neck, JVD + Chest: Fine bibasilar crackles CVS: N S1&S2, no murmurs, rubs or gallop GI: soft, NT, ND, BS+ Musculoskeletal: warm, 2+ pitting edema bilaterally     Data Review:    CBC  Recent Labs Lab 10/08/16 0634 10/09/16 0428 10/10/16 0319 10/10/16 1554 10/11/16 0826  WBC 7.7 7.3 7.3 9.5 7.9  HGB 8.2* 8.2* 7.5* 9.9* 9.4*  HCT 27.0* 27.1* 25.1* 31.9* 31.1*  PLT 513* 538* 478* 499* 425*  MCV 81.3 82.1 81.5 83.1 83.2  MCH 24.7* 24.8* 24.4* 25.8* 25.1*  MCHC 30.4 30.3 29.9* 31.0 30.2  RDW 19.2* 19.9* 19.8* 19.3* 19.5*    Chemistries   Recent Labs Lab 10/06/16 1517  10/07/16 2010 10/08/16 0634 10/09/16 0428 10/10/16 0533  10/11/16 0607 10/11/16 0826  NA  --   < > 133* 136 135 136 135  --   K  --   < > 4.5 4.3 4.7 4.4 4.3  --   CL  --   < > 105 109 107 107 109  --   CO2  --   < > 16* 15* 18* 19* 16*  --   GLUCOSE  --   < > 243* 149* 220* 192* 157*  --   BUN  --   < > 42* 41* 39* 36* 33*  --   CREATININE  --   < > 2.01* 1.86* 1.83* 1.53* 1.53*  --   CALCIUM  --   < > 8.5* 8.8* 8.8* 9.0 8.8*  --   MG 2.1  --   --   --   --   --   --   --   AST  --   --   --  14* 13*  --  14*  --   ALT  --   --   --  11* 8*  --  QUANTITY NOT SUFFICIENT, UNABLE TO PERFORM TEST 10*  ALKPHOS  --   --   --  90 91  --  83  --   BILITOT  --   --   --  0.4 0.2*  --  QUANTITY NOT SUFFICIENT, UNABLE TO PERFORM TEST 0.5  < > = values in this interval not displayed. ------------------------------------------------------------------------------------------------------------------  No results for input(s): CHOL, HDL, LDLCALC, TRIG, CHOLHDL, LDLDIRECT in the last 72 hours.  Lab Results  Component Value Date   HGBA1C 11.3 (H) 10/06/2016   ------------------------------------------------------------------------------------------------------------------  Recent Labs  10/10/16 0950  T3FREE 2.4   ------------------------------------------------------------------------------------------------------------------ No results for input(s): VITAMINB12, FOLATE, FERRITIN, TIBC, IRON, RETICCTPCT in the last 72 hours.  Coagulation profile  Recent Labs Lab 10/07/16 2010  INR 1.19    No results for input(s): DDIMER in the last 72 hours.  Cardiac Enzymes  Recent Labs Lab 10/06/16 1517 10/06/16 2031 10/07/16 0233  TROPONINI 0.83* 0.79* 0.79*   ------------------------------------------------------------------------------------------------------------------    Component Value Date/Time   BNP 409.2 (H) 08/27/2016 1514    Inpatient Medications  Scheduled Meds: . amLODipine  5 mg Oral Daily  . [START ON 10/12/2016] aspirin  81 mg  Oral Daily  . atorvastatin  40 mg Oral q1800  . cyclobenzaprine  5 mg Oral TID  . docusate sodium  100 mg Oral BID  . furosemide  40 mg Intravenous BID  . insulin aspart  0-5 Units Subcutaneous QHS  . insulin aspart  0-9 Units Subcutaneous TID WC  . insulin glargine  10 Units Subcutaneous QHS  . isosorbide mononitrate  30 mg Oral Daily  . mouth rinse  15 mL Mouth Rinse BID  . metoprolol tartrate  12.5 mg Oral BID  . pantoprazole  40 mg Oral Daily  . sodium chloride flush  3 mL Intravenous Q12H   Continuous Infusions: . sodium chloride 50 mL/hr at 10/11/16 1318  . sodium chloride     PRN Meds:.sodium chloride, acetaminophen, ALPRAZolam, artificial tears, fluticasone, nitroGLYCERIN, ondansetron (ZOFRAN) IV, polyethylene glycol, sodium chloride flush, traMADol  Micro Results No results found for this or any previous visit (from the past 240 hour(s)).  Radiology Reports Dg Chest Port 1 View  Result Date: 10/06/2016 CLINICAL DATA:  Shortness of breath with left-sided chest pain. History of hypertension, diabetes and congestive heart failure. EXAM: PORTABLE CHEST 1 VIEW COMPARISON:  Radiographs 10/06/2016.  CT 08/01/2016. FINDINGS: 1655 hour. The heart size and mediastinal contours are stable. There are small bilateral pleural effusions with mild bibasilar atelectasis, vascular congestion and probable mild edema. No confluent airspace opacity or pneumothorax. No acute osseous findings are seen. Telemetry leads overlie the chest. IMPRESSION: Probable mild congestive heart failure with mild edema and small bilateral pleural effusions. Electronically Signed   By: Carey Bullocks M.D.   On: 10/06/2016 17:03    Time Spent in minutes 35   Eddie North M.D on 10/11/2016 at 1:41 PM  Between 7am to 7pm - Pager - [phone]  After 7pm go to [email or web address] - password [facility]  [facility] -  Office  [phone]

## 2016-10-11 NOTE — Progress Notes (Signed)
OT Cancellation Note  Patient Details Name: Emily Miranda MRN: [id number] DOB: 05-Sep-1940   Cancelled Treatment:    Reason Eval/Treat Not Completed: Patient at procedure or test/ unavailable (cath lab). Will follow.  [name] 10/11/2016, 10:01 AM  [phone]

## 2016-10-11 NOTE — Progress Notes (Signed)
ANTICOAGULATION CONSULT NOTE - Follow Up Consult  Pharmacy Consult for Heparin Indication: chest pain/ACS  Allergies  Allergen Reactions  . Levofloxacin Nausea And Vomiting    Other reaction(s): Confusion  . Gabapentin Other (See Comments)    Disoriented, no strength in legs    Patient Measurements: Height: 5\' 6"  (167.6 cm) Weight: 147 lb 3.2 oz (66.8 kg) IBW/kg (Calculated) : 59.3 Heparin Dosing Weight: 67 kg  Vital Signs: Temp: 97.9 F (36.6 C) (06/01 0500) Temp Source: Axillary (06/01 0500) BP: 134/73 (05/31 2200) Pulse Rate: 107 (06/01 0500)  Labs:  Recent Labs  10/09/16 0428 10/10/16 0319 10/10/16 0533 10/10/16 1554 10/11/16 0607  HGB 8.2* 7.5*  --  9.9*  --   HCT 27.1* 25.1*  --  31.9*  --   PLT 538* 478*  --  499*  --   HEPARINUNFRC 0.36 0.25*  --  0.45 0.47  CREATININE 1.83*  --  1.53*  --   --    Estimated Creatinine Clearance: 30.2 mL/min (A) (by C-G formula based on SCr of 1.53 mg/dL (H)).  Assessment: 76 yo F presented from OSH with CP.  Continues on heparin gtt. Heparin level has been therapeutic twice on current infusion rate. HgB 9.9 following 1 uPRBC on 5/31. No new labs this morning. No issues with line or bleeding reported per RN. Plan for cath in 1-2 days  Heparin level therapeutic: 0.47  Goal of Therapy:  Heparin level 0.3-0.7 units/ml Monitor platelets by anticoagulation protocol: Yes   Plan:  Continue heparin gtt at 1450 units/hr Daily heparin level/ CBC Monitor s/sx of bleeding F/u on plan for cath  Ruben Im, PharmD Clinical Pharmacist 10/11/2016 8:02 AM

## 2016-10-11 NOTE — H&P (View-Only) (Signed)
Progress Note  Patient Name: Emily Miranda Date of Encounter: 10/10/2016  Primary Cardiologist: Hochrein  Subjective   Breathing is better this morning.   Inpatient Medications    Scheduled Meds: . amLODipine  5 mg Oral Daily  . aspirin  81 mg Oral Daily  . atorvastatin  40 mg Oral q1800  . cyclobenzaprine  5 mg Oral TID  . docusate sodium  100 mg Oral BID  . furosemide  20 mg Intravenous Once  . furosemide  40 mg Intravenous BID  . insulin aspart  0-5 Units Subcutaneous QHS  . insulin aspart  0-9 Units Subcutaneous TID WC  . insulin glargine  10 Units Subcutaneous QHS  . isosorbide mononitrate  15 mg Oral Daily  . mouth rinse  15 mL Mouth Rinse BID  . metoprolol tartrate  12.5 mg Oral BID  . nitroGLYCERIN  0.5 inch Topical Q6H  . pantoprazole  40 mg Oral Daily   Continuous Infusions: . sodium chloride 10 mL/hr at 10/06/16 1603  . sodium chloride    . heparin 1,450 Units/hr (10/10/16 0535)   PRN Meds: acetaminophen, ALPRAZolam, artificial tears, fluticasone, nitroGLYCERIN, ondansetron (ZOFRAN) IV, polyethylene glycol, traMADol   Vital Signs    Vitals:   10/09/16 0851 10/09/16 1443 10/09/16 2110 10/10/16 0516  BP: (!) 141/78  (!) 153/86 139/78  Pulse: 90  (!) 109 (!) 102  Resp:   (!) 21 (!) 25  Temp:   97.6 F (36.4 C) 98.3 F (36.8 C)  TempSrc:   Oral Oral  SpO2:  (!) 84%  96%  Weight:    146 lb 14.4 oz (66.6 kg)  Height:        Intake/Output Summary (Last 24 hours) at 10/10/16 0858 Last data filed at 10/10/16 0370  Gross per 24 hour  Intake           465.98 ml  Output             1450 ml  Net          -984.02 ml   Filed Weights   10/08/16 0413 10/09/16 0425 10/10/16 0516  Weight: 144 lb 13.5 oz (65.7 kg) 147 lb 9.6 oz (67 kg) 146 lb 14.4 oz (66.6 kg)    Telemetry    SR - Personally Reviewed  ECG    N/a - Personally Reviewed  Physical Exam   General: Well developed, well nourished, female appearing in no acute distress. Head:  Normocephalic, atraumatic.  Neck: Supple without bruits, JVD. Lungs:  Resp regular and unlabored, Diminished bilaterally in lower lobes. Heart: RRR, S1, S2, no S3, S4, soft murmur; no rub. Abdomen: Soft, non-tender, non-distended with normoactive bowel sounds. No hepatomegaly. No rebound/guarding. No obvious abdominal masses. Extremities: No clubbing, cyanosis, 1+ LE edema R>L (slightly worse this morning). Distal pedal pulses are 2+ bilaterally. Neuro: Alert and oriented X 3. Moves all extremities spontaneously. Psych: Normal affect.  Labs    Chemistry Recent Labs Lab 10/08/16 0634 10/09/16 0428 10/10/16 0533  NA 136 135 136  K 4.3 4.7 4.4  CL 109 107 107  CO2 15* 18* 19*  GLUCOSE 149* 220* 192*  BUN 41* 39* 36*  CREATININE 1.86* 1.83* 1.53*  CALCIUM 8.8* 8.8* 9.0  PROT 6.5 6.7  --   ALBUMIN 2.0* 1.9*  --   AST 14* 13*  --   ALT 11* 8*  --   ALKPHOS 90 91  --   BILITOT 0.4 0.2*  --   GFRNONAA 26*  26* 32*  GFRAA 30* 30* 38*  ANIONGAP 12 10 10      Hematology Recent Labs Lab 10/08/16 1610 10/09/16 0428 10/10/16 0319  WBC 7.7 7.3 7.3  RBC 3.32* 3.30* 3.08*  HGB 8.2* 8.2* 7.5*  HCT 27.0* 27.1* 25.1*  MCV 81.3 82.1 81.5  MCH 24.7* 24.8* 24.4*  MCHC 30.4 30.3 29.9*  RDW 19.2* 19.9* 19.8*  PLT 513* 538* 478*    Cardiac Enzymes Recent Labs Lab 10/06/16 1517 10/06/16 2031 10/07/16 0233  TROPONINI 0.83* 0.79* 0.79*   No results for input(s): TROPIPOC in the last 168 hours.   BNPNo results for input(s): BNP, PROBNP in the last 168 hours.   DDimer No results for input(s): DDIMER in the last 168 hours.    Radiology    No results found.  Cardiac Studies   N/A  Patient Profile     76 y.o. female with history of mild to moderate aortic stenosis, hypertension, diabetes and mixed systolic and diastolic heart failure. She also has moderate renal insufficiency. She was admitted with unstable angina. Her enzymes are low and flat. She is on IV  heparin.  Assessment & Plan    1. NSTEMI:presented with chest pain. Currently on IV heparin. Attempted to take down for cath yesterday, but became dyspneic on the cath table. Unable to do cath. Sent back to unit and restarted on IV lasix. No chest pain this morning. -- continue nitro paste, BB, ASA, statin and IV heparin -- plan for cath once respiratory status improves.   2. Acute combined diastolic and systolic CHF:1.4L UOP yesterday, weight down to 146 lb today.  -- continue IV lasix  -- Cr actually improved. Daily BMET -- daily weights, and I&Os  3. Anemia:Received 1 unit PRBCs 10/07/16 with improvement in Hgb from 7.1>>8.2. Fall in Hgb again today to 7.5. Planned for additional unit of PRBCs today. Continues to deny any bleeding. Followed as outpatient for Aranesp shots, but has recently missed 2 doses due to being hospitalized.  -- daily CBC -- [facility] following, appreciate assistance  4. CKD with AKI:Cr improve today. -- follow BMET  5. DM:on SSI   Signed, Laverda Page, NP  10/10/2016, 8:58 AM    I have personally seen and examined this patient with Laverda Page, NP. I agree with the assessment and plan as outlined above. She is still volume overloaded. Will continue diuresis with IV Lasix today. Hopefully can get her in position for cardiac cath over next several days. H/H still falling without active bleeding. Likely chronic anemia. Primary team planning for transfusion today.   Verne Carrow 10/10/2016 9:53 AM

## 2016-10-12 DIAGNOSIS — I2510 Atherosclerotic heart disease of native coronary artery without angina pectoris: Secondary | ICD-10-CM

## 2016-10-12 DIAGNOSIS — Z9861 Coronary angioplasty status: Secondary | ICD-10-CM

## 2016-10-12 DIAGNOSIS — I739 Peripheral vascular disease, unspecified: Secondary | ICD-10-CM

## 2016-10-12 DIAGNOSIS — I25118 Atherosclerotic heart disease of native coronary artery with other forms of angina pectoris: Secondary | ICD-10-CM

## 2016-10-12 DIAGNOSIS — E782 Mixed hyperlipidemia: Secondary | ICD-10-CM

## 2016-10-12 DIAGNOSIS — I10 Essential (primary) hypertension: Secondary | ICD-10-CM

## 2016-10-12 LAB — BASIC METABOLIC PANEL
Anion gap: 11 (ref 5–15)
BUN: 28 mg/dL — ABNORMAL HIGH (ref 6–20)
CO2: 17 mmol/L — ABNORMAL LOW (ref 22–32)
Calcium: 8.9 mg/dL (ref 8.9–10.3)
Chloride: 105 mmol/L (ref 101–111)
Creatinine, Ser: 1.37 mg/dL — ABNORMAL HIGH (ref 0.44–1.00)
GFR calc Af Amer: 43 mL/min — ABNORMAL LOW (ref >60)
GFR calc non Af Amer: 37 mL/min — ABNORMAL LOW (ref >60)
Glucose, Bld: 147 mg/dL — ABNORMAL HIGH (ref 65–99)
Potassium: 4.3 mmol/L (ref 3.5–5.1)
Sodium: 133 mmol/L — ABNORMAL LOW (ref 135–145)

## 2016-10-12 LAB — GLUCOSE, CAPILLARY
Glucose-Capillary: 151 mg/dL — ABNORMAL HIGH (ref 65–99)
Glucose-Capillary: 243 mg/dL — ABNORMAL HIGH (ref 65–99)
Glucose-Capillary: 177 mg/dL — ABNORMAL HIGH (ref 65–99)
Glucose-Capillary: 196 mg/dL — ABNORMAL HIGH (ref 65–99)

## 2016-10-12 LAB — CBC
HCT: 30.6 % — ABNORMAL LOW (ref 36.0–46.0)
Hemoglobin: 9.5 g/dL — ABNORMAL LOW (ref 12.0–15.0)
MCH: 25.8 pg — ABNORMAL LOW (ref 26.0–34.0)
MCHC: 31 g/dL (ref 30.0–36.0)
MCV: 83.2 fL (ref 78.0–100.0)
Platelets: 479 10*3/uL — ABNORMAL HIGH (ref 150–400)
RBC: 3.68 MIL/uL — ABNORMAL LOW (ref 3.87–5.11)
RDW: 19.7 % — ABNORMAL HIGH (ref 11.5–15.5)
WBC: 8.6 10*3/uL (ref 4.0–10.5)

## 2016-10-12 MED ORDER — METOLAZONE 5 MG PO TABS
5.0000 mg | ORAL_TABLET | Freq: Once | ORAL | Status: AC
  Administered 2016-10-12: 5 mg via ORAL
  Filled 2016-10-12: qty 1

## 2016-10-12 MED ORDER — ISOSORBIDE MONONITRATE ER 60 MG PO TB24
60.0000 mg | ORAL_TABLET | Freq: Every day | ORAL | Status: DC
  Administered 2016-10-13 – 2016-10-16 (×4): 60 mg via ORAL
  Filled 2016-10-12 (×4): qty 1

## 2016-10-12 MED ORDER — ENOXAPARIN SODIUM 40 MG/0.4ML ~~LOC~~ SOLN
40.0000 mg | SUBCUTANEOUS | Status: DC
  Administered 2016-10-12 – 2016-10-15 (×4): 40 mg via SUBCUTANEOUS
  Filled 2016-10-12 (×6): qty 0.4

## 2016-10-12 MED ORDER — ISOSORBIDE MONONITRATE ER 30 MG PO TB24
30.0000 mg | ORAL_TABLET | Freq: Once | ORAL | Status: AC
  Administered 2016-10-12: 30 mg via ORAL
  Filled 2016-10-12: qty 1

## 2016-10-12 NOTE — Progress Notes (Signed)
Progress Note  Patient Name: Emily Miranda Date of Encounter: 10/12/2016  Primary Cardiologist: Hochrein  Subjective   Has chest pressure when lying down. Denies shortness of breath. Has bilateral leg swelling.  Inpatient Medications    Scheduled Meds: . amLODipine  5 mg Oral Daily  . aspirin  81 mg Oral Daily  . atorvastatin  40 mg Oral q1800  . cyclobenzaprine  5 mg Oral TID  . docusate sodium  100 mg Oral BID  . enoxaparin (LOVENOX) injection  40 mg Subcutaneous Q24H  . furosemide  40 mg Intravenous BID  . insulin aspart  0-5 Units Subcutaneous QHS  . insulin aspart  0-9 Units Subcutaneous TID WC  . insulin glargine  10 Units Subcutaneous QHS  . isosorbide mononitrate  30 mg Oral Once  . [START ON 10/13/2016] isosorbide mononitrate  60 mg Oral Daily  . mouth rinse  15 mL Mouth Rinse BID  . metolazone  5 mg Oral Once  . metoprolol tartrate  12.5 mg Oral BID  . pantoprazole  40 mg Oral Daily  . sodium chloride flush  3 mL Intravenous Q12H   Continuous Infusions: . sodium chloride     PRN Meds: sodium chloride, acetaminophen, ALPRAZolam, artificial tears, fluticasone, nitroGLYCERIN, ondansetron (ZOFRAN) IV, polyethylene glycol, sodium chloride flush, traMADol   Vital Signs    Vitals:   10/11/16 1959 10/11/16 2122 10/12/16 0206 10/12/16 0420  BP: 133/77 (!) 145/76  (!) 153/78  Pulse: 96 89  100  Resp: (!) 21   (!) 22  Temp: 98.7 F (37.1 C)   98.5 F (36.9 C)  TempSrc: Axillary     SpO2: 98%   97%  Weight:   146 lb 1.6 oz (66.3 kg)   Height:        Intake/Output Summary (Last 24 hours) at 10/12/16 1004 Last data filed at 10/12/16 1610  Gross per 24 hour  Intake           354.17 ml  Output             1850 ml  Net         -1495.83 ml   Filed Weights   10/10/16 0516 10/11/16 0500 10/12/16 0206  Weight: 146 lb 14.4 oz (66.6 kg) 147 lb 3.2 oz (66.8 kg) 146 lb 1.6 oz (66.3 kg)    Telemetry    Sinus rhythm with PVC's and insignificant pauses < 2 secs -  Personally Reviewed  ECG      Physical Exam   GEN: No acute distress.   Neck: No JVD Cardiac: RRR, 2/6 ejection systolic murmur loudest over RUSB, no rubs, or gallops.  Respiratory: Diminished at bases, +bibasilar crackles. GI: Soft, nontender, non-distended  MS: 3+ pitting b/l pretibial edema; No deformity. Neuro:  Nonfocal  Psych: Normal affect   Labs    Chemistry Recent Labs Lab 10/08/16 9604 10/09/16 0428 10/10/16 0533 10/11/16 5409 10/11/16 0826 10/12/16 0632  NA 136 135 136 135  --  133*  K 4.3 4.7 4.4 4.3  --  4.3  CL 109 107 107 109  --  105  CO2 15* 18* 19* 16*  --  17*  GLUCOSE 149* 220* 192* 157*  --  147*  BUN 41* 39* 36* 33*  --  28*  CREATININE 1.86* 1.83* 1.53* 1.53*  --  1.37*  CALCIUM 8.8* 8.8* 9.0 8.8*  --  8.9  PROT 6.5 6.7  --  6.5  --   --  ALBUMIN 2.0* 1.9*  --  1.9*  --   --   AST 14* 13*  --  14*  --   --   ALT 11* 8*  --  QUANTITY NOT SUFFICIENT, UNABLE TO PERFORM TEST 10*  --   ALKPHOS 90 91  --  83  --   --   BILITOT 0.4 0.2*  --  QUANTITY NOT SUFFICIENT, UNABLE TO PERFORM TEST 0.5  --   GFRNONAA 26* 26* 32* 32*  --  37*  GFRAA 30* 30* 38* 38*  --  43*  ANIONGAP 12 10 10 10   --  11     Hematology Recent Labs Lab 10/10/16 1554 10/11/16 0826 10/12/16 0632  WBC 9.5 7.9 8.6  RBC 3.84* 3.74* 3.68*  HGB 9.9* 9.4* 9.5*  HCT 31.9* 31.1* 30.6*  MCV 83.1 83.2 83.2  MCH 25.8* 25.1* 25.8*  MCHC 31.0 30.2 31.0  RDW 19.3* 19.5* 19.7*  PLT 499* 425* 479*    Cardiac Enzymes Recent Labs Lab 10/06/16 1517 10/06/16 2031 10/07/16 0233  TROPONINI 0.83* 0.79* 0.79*   No results for input(s): TROPIPOC in the last 168 hours.   BNPNo results for input(s): BNP, PROBNP in the last 168 hours.   DDimer No results for input(s): DDIMER in the last 168 hours.   Radiology    No results found.  Cardiac Studies   Cath (10/11/16):  Conclusions: 1. Three vessel coronary artery disease, including diffuse LAD disease with sequential 50%  lesions, sequential 70% and 60% mid LCx stenosis, 50% mid RCA lesion, and 90% stenosis in midportion of small rPDA. 2. Mildly elevated left ventricular filling pressure. 3. Moderately elevated aortic valve gradient (peak-to-peak gradient 25 mmHg).  Recommendations: 1. Medical therapy given improvement of chest pain and shortness with blood transfusion and diuresis. 2. If symptoms worsen despite stable hemoglobin and optimized volume status, FFR/PCI to the LCx could be performed. I would defer PCI to PDA given its small size.  Patient Profile     76 y.o. female with history of mild to moderate aortic stenosis, hypertension, diabetes and mixed systolic and diastolic heart failure. She also has moderate renal insufficiency. She was admitted with unstable angina. Her enzymes are low and flat. She is on IV heparin.  Assessment & Plan    1. NSTEMI: Cath results reviewed above. Medical management recommended. I will increase Imdur to 60 mg and continue to diurese. Continue ASA, Lipitor, and metoprolol.  2. Acute on chronic combined systolic and diastolic heart failure, LVEF 40-45%: Reasonable urinary output on IV Lasix 40 mg bid. However, still markedly edematous. I will increase nitrates and give one dose of metolazone 5 mg.  3. Hypertension: Mildly elevated. Increasing nitrates and diuresing. Will monitor.  4. HLD: Continue Lipitor.  5. Aortic stenosis: Mild by echo in March 2018.  6. Anemia: Received PRBC transfusion. Aranesp as outpatient.  7. PVD: Needs outpatient follow up. Continue ASA and statin.  8. CKD stage 3: Stable. Monitor given diuretic requirement.  Signed, Prentice Docker, MD  10/12/2016, 10:04 AM

## 2016-10-12 NOTE — Progress Notes (Signed)
PROGRESS NOTE                                                                                                                                                                                                             Patient Demographics:    Emily Miranda, is a 76 y.o. female, DOB - [birth year], PET:[id number]  Admit date - 10/06/2016   Admitting Physician Hillis Range, MD  Outpatient Primary MD for the patient is Noni Saupe, MD  LOS - 6  Outpatient Specialists:  No chief complaint on file.      Brief Narrative   76 year old female with history of diastolic CHF, moderate aortic stenosis, hypertension, uncontrolled diabetes mellitus recently started on insulin, neuropathy, chronic kidney disease stage III was transferred to [facility] from [facility] ED for NSTEMI and acute CHF.   Subjective:   Patient seen and examined this morning. Still has shortness of breath and increased leg swellings.   Assessment  & Plan :    Principal Problem:   Non-ST elevation (NSTEMI) myocardial infarction (HCC) EF of 40-45% per echo from March this year.   Cardiac cath showing three-vessel coronary artery disease including diffuse LAD disease. Cardiology recommends medical therapy given improvement in her chest pain and shortness of breath with Diuretics and blood transfusion. Continue aspirin, statin, metoprolol and Imdur.    Active Problems: Acute on chronic systolic CHF Echo with EF of 40-45%. Diuresing well on current dose of IV Lasix. Monitor strict I/O and daily weights. Continue aspirin, beta blocker and Imdur.  Anemia of chronic kidney disease Receives Aranesp injections as outpatient. Noted for dropping hemoglobin to 7.5, improved with 1 unit PRBC.    DM type 2 causing CKD stage 3 (HCC) A1c of 11.3. Renal function at baseline. Continue current dose of Lantus with sliding scale coverage.   Diabetic  neuropathy with leg cramps Started on Flexeril.  Elevated TSH and free T4 Possibly due to acute illness. Normal free T3. Monitor as outpatient.  Right leg swelling. Patient was evaluated as outpatient with negative venous Doppler for DVT. Right lower extremity arterial Doppler showed multiple peripheral vascular lesions including significant stenosis of SFA and right distal ATA. I will refer patient to vascular surgery as outpatient.    Code Status : Full code  Family Communication  : None at bedside  Disposition Plan  : Pending hospital course and symptomatic improvement. PT evaluation. Possibly discharge on 6/4.  Barriers For Discharge : Active symptoms   Consults  :  Cardiology  Procedures  :  Left and right heart catheterization  DVT Prophylaxis  : IV heparin  Lab Results  Component Value Date   PLT 479 (H) 10/12/2016    Antibiotics  :  None  Anti-infectives    None        Objective:   Vitals:   10/11/16 1959 10/11/16 2122 10/12/16 0206 10/12/16 0420  BP: 133/77 (!) 145/76  (!) 153/78  Pulse: 96 89  100  Resp: (!) 21   (!) 22  Temp: 98.7 F (37.1 C)   98.5 F (36.9 C)  TempSrc: Axillary     SpO2: 98%   97%  Weight:   66.3 kg (146 lb 1.6 oz)   Height:        Wt Readings from Last 3 Encounters:  10/12/16 66.3 kg (146 lb 1.6 oz)  08/27/16 57.2 kg (126 lb 3.2 oz)  08/05/16 62.8 kg (138 lb 8 oz)     Intake/Output Summary (Last 24 hours) at 10/12/16 0954 Last data filed at 10/12/16 1610  Gross per 24 hour  Intake           354.17 ml  Output             1850 ml  Net         -1495.83 ml     Physical Exam Gen.: Elderly female not in distress HEENT: Moist mucosa, supple neck Chest: Bibasilar crackles CVS: Normal S1 and S2, no murmurs GI: Soft, nondistended, nontender Musculoskeletal: Warm, 1+ pitting edema bilaterally (increased swelling of the right leg, charcot right foot      Data Review:    CBC  Recent Labs Lab 10/09/16 0428  10/10/16 0319 10/10/16 1554 10/11/16 0826 10/12/16 0632  WBC 7.3 7.3 9.5 7.9 8.6  HGB 8.2* 7.5* 9.9* 9.4* 9.5*  HCT 27.1* 25.1* 31.9* 31.1* 30.6*  PLT 538* 478* 499* 425* 479*  MCV 82.1 81.5 83.1 83.2 83.2  MCH 24.8* 24.4* 25.8* 25.1* 25.8*  MCHC 30.3 29.9* 31.0 30.2 31.0  RDW 19.9* 19.8* 19.3* 19.5* 19.7*    Chemistries   Recent Labs Lab 10/06/16 1517  10/08/16 9604 10/09/16 0428 10/10/16 0533 10/11/16 0607 10/11/16 0826 10/12/16 0632  NA  --   < > 136 135 136 135  --  133*  K  --   < > 4.3 4.7 4.4 4.3  --  4.3  CL  --   < > 109 107 107 109  --  105  CO2  --   < > 15* 18* 19* 16*  --  17*  GLUCOSE  --   < > 149* 220* 192* 157*  --  147*  BUN  --   < > 41* 39* 36* 33*  --  28*  CREATININE  --   < > 1.86* 1.83* 1.53* 1.53*  --  1.37*  CALCIUM  --   < > 8.8* 8.8* 9.0 8.8*  --  8.9  MG 2.1  --   --   --   --   --   --   --   AST  --   --  14* 13*  --  14*  --   --   ALT  --   --  11* 8*  --  QUANTITY NOT SUFFICIENT, UNABLE TO PERFORM TEST  10*  --   ALKPHOS  --   --  90 91  --  83  --   --   BILITOT  --   --  0.4 0.2*  --  QUANTITY NOT SUFFICIENT, UNABLE TO PERFORM TEST 0.5  --   < > = values in this interval not displayed. ------------------------------------------------------------------------------------------------------------------ No results for input(s): CHOL, HDL, LDLCALC, TRIG, CHOLHDL, LDLDIRECT in the last 72 hours.  Lab Results  Component Value Date   HGBA1C 11.3 (H) 10/06/2016   ------------------------------------------------------------------------------------------------------------------  Recent Labs  10/10/16 0950  T3FREE 2.4   ------------------------------------------------------------------------------------------------------------------ No results for input(s): VITAMINB12, FOLATE, FERRITIN, TIBC, IRON, RETICCTPCT in the last 72 hours.  Coagulation profile  Recent Labs Lab 10/07/16 2010  INR 1.19    No results for input(s): DDIMER in  the last 72 hours.  Cardiac Enzymes  Recent Labs Lab 10/06/16 1517 10/06/16 2031 10/07/16 0233  TROPONINI 0.83* 0.79* 0.79*   ------------------------------------------------------------------------------------------------------------------    Component Value Date/Time   BNP 409.2 (H) 08/27/2016 1514    Inpatient Medications  Scheduled Meds: . amLODipine  5 mg Oral Daily  . aspirin  81 mg Oral Daily  . atorvastatin  40 mg Oral q1800  . cyclobenzaprine  5 mg Oral TID  . docusate sodium  100 mg Oral BID  . enoxaparin (LOVENOX) injection  40 mg Subcutaneous Q24H  . furosemide  40 mg Intravenous BID  . insulin aspart  0-5 Units Subcutaneous QHS  . insulin aspart  0-9 Units Subcutaneous TID WC  . insulin glargine  10 Units Subcutaneous QHS  . isosorbide mononitrate  30 mg Oral Daily  . mouth rinse  15 mL Mouth Rinse BID  . metoprolol tartrate  12.5 mg Oral BID  . pantoprazole  40 mg Oral Daily  . sodium chloride flush  3 mL Intravenous Q12H   Continuous Infusions: . sodium chloride     PRN Meds:.sodium chloride, acetaminophen, ALPRAZolam, artificial tears, fluticasone, nitroGLYCERIN, ondansetron (ZOFRAN) IV, polyethylene glycol, sodium chloride flush, traMADol  Micro Results No results found for this or any previous visit (from the past 240 hour(s)).  Radiology Reports Dg Chest Port 1 View  Result Date: 10/06/2016 CLINICAL DATA:  Shortness of breath with left-sided chest pain. History of hypertension, diabetes and congestive heart failure. EXAM: PORTABLE CHEST 1 VIEW COMPARISON:  Radiographs 10/06/2016.  CT 08/01/2016. FINDINGS: 1655 hour. The heart size and mediastinal contours are stable. There are small bilateral pleural effusions with mild bibasilar atelectasis, vascular congestion and probable mild edema. No confluent airspace opacity or pneumothorax. No acute osseous findings are seen. Telemetry leads overlie the chest. IMPRESSION: Probable mild congestive heart  failure with mild edema and small bilateral pleural effusions. Electronically Signed   By: Carey Bullocks M.D.   On: 10/06/2016 17:03    Time Spent in minutes 25   Eddie North M.D on 10/12/2016 at 9:54 AM  Between 7am to 7pm - Pager - [phone]  After 7pm go to [email or web address] - password [facility]  [facility] -  Office  [phone]

## 2016-10-12 NOTE — Evaluation (Signed)
Occupational Therapy Evaluation Patient Details Name: Emily Miranda MRN: [id number] DOB: Apr 23, 1941 Today's Date: 10/12/2016    History of Present Illness 76 yo admitted with CP and NSTEMI, unable to cath 5/30 due to SOB supine. PMhx: DM, HTN, AS, HF, anemia, charcot foot   Clinical Impression   Pt reports he husband assisted minimally with BADL PTA. Currently pt overall min guard assist for functional mobility and min assist for ADL. Pt presenting with DOE 2/4, decreased activity tolerance, generalized weakness, and deconditioning impacting her independence and safety with ADL and functional mobility. Pt planning to d/chome with 24/7 supervision from family. Pt would benefit from continued skilled OT to address established goals.    Follow Up Recommendations  No OT follow up;Supervision/Assistance - 24 hour    Equipment Recommendations  3 in 1 bedside commode (for use as shower chair)    Recommendations for Other Services       Precautions / Restrictions Precautions Precautions: Fall Precaution Comments: pt reports wound to right foot has just healed and she is not allowed to walk more than short household distance. Hasn't walked more than 20' in 2 years Restrictions Weight Bearing Restrictions: Yes RLE Weight Bearing: Weight bearing as tolerated      Mobility Bed Mobility Overal bed mobility: Modified Independent                Transfers Overall transfer level: Needs assistance Equipment used: Rolling walker (2 wheeled) Transfers: Sit to/from Stand Sit to Stand: Supervision         General transfer comment: for safety, good hand plcament    Balance Overall balance assessment: Needs assistance Sitting-balance support: Feet supported;No upper extremity supported Sitting balance-Leahy Scale: Good     Standing balance support: Bilateral upper extremity supported Standing balance-Leahy Scale: Poor                             ADL either  performed or assessed with clinical judgement   ADL Overall ADL's : Needs assistance/impaired Eating/Feeding: Set up;Sitting   Grooming: Min guard;Standing   Upper Body Bathing: Set up;Supervision/ safety;Sitting   Lower Body Bathing: Sit to/from stand;Minimal assistance   Upper Body Dressing : Set up;Supervision/safety;Sitting   Lower Body Dressing: Minimal assistance;Sit to/from stand   Toilet Transfer: Min guard;Ambulation;RW           Functional mobility during ADLs: Min guard;Rolling walker General ADL Comments: Pt with DOE 2/3; educated on pursed lip breathing.     Vision         Perception     Praxis      Pertinent Vitals/Pain Pain Assessment: No/denies pain     Hand Dominance Right   Extremity/Trunk Assessment Upper Extremity Assessment Upper Extremity Assessment: Generalized weakness   Lower Extremity Assessment Lower Extremity Assessment: Defer to PT evaluation   Cervical / Trunk Assessment Cervical / Trunk Assessment: Normal   Communication Communication Communication: No difficulties   Cognition Arousal/Alertness: Awake/alert Behavior During Therapy: WFL for tasks assessed/performed Overall Cognitive Status: Within Functional Limits for tasks assessed                                     General Comments       Exercises     Shoulder Instructions      Home Living Family/patient expects to be discharged to:: Private residence Living Arrangements: Spouse/significant other  Available Help at Discharge: Family;Available 24 hours/day Type of Home: House Home Access: Ramped entrance     Home Layout: One level     Bathroom Shower/Tub: Chief Strategy Officer: Handicapped height     Home Equipment: Environmental consultant - 2 wheels;Transport chair          Prior Functioning/Environment Level of Independence: Needs assistance  Gait / Transfers Assistance Needed: supervision for transfers, walks to bathroom and back,  transports chair for all other mobility ADL's / Homemaking Assistance Needed: spouse does all the homemaking, assists with transfers into tub and assists with bathing. Was sponge bathing until recently due to wound            OT Problem List: Decreased strength;Decreased activity tolerance;Impaired balance (sitting and/or standing);Decreased knowledge of use of DME or AE;Cardiopulmonary status limiting activity      OT Treatment/Interventions: Self-care/ADL training;Energy conservation;DME and/or AE instruction;Therapeutic activities;Patient/family education;Balance training    OT Goals(Current goals can be found in the care plan section) Acute Rehab OT Goals Patient Stated Goal: get better and back home OT Goal Formulation: With patient Time For Goal Achievement: 10/26/16 Potential to Achieve Goals: Good ADL Goals Pt Will Perform Upper Body Bathing: with modified independence;sitting;standing Pt Will Perform Lower Body Bathing: with modified independence;sit to/from stand Pt Will Transfer to Toilet: with modified independence;ambulating;regular height toilet Pt Will Perform Toileting - Clothing Manipulation and hygiene: with modified independence;sit to/from stand Pt Will Perform Tub/Shower Transfer: with supervision;Tub transfer;ambulating;3 in 1;rolling walker Additional ADL Goal #1: Pt will independently verbally recall 3 energy conservation strategies and use during ADL.  OT Frequency: Min 2X/week   Barriers to D/C:            Co-evaluation              AM-PAC PT "6 Clicks" Daily Activity     Outcome Measure Help from another person eating meals?: None Help from another person taking care of personal grooming?: A Little Help from another person toileting, which includes using toliet, bedpan, or urinal?: A Little Help from another person bathing (including washing, rinsing, drying)?: A Little Help from another person to put on and taking off regular upper body  clothing?: A Little Help from another person to put on and taking off regular lower body clothing?: A Little 6 Click Score: 19   End of Session Equipment Utilized During Treatment: Rolling walker;Oxygen Nurse Communication: Mobility status  Activity Tolerance: Patient tolerated treatment well Patient left: in bed;with call bell/phone within reach;Other (comment) (sitting EOB)  OT Visit Diagnosis: Other abnormalities of gait and mobility (R26.89);Unsteadiness on feet (R26.81)                Time: 6283-6629 OT Time Calculation (min): 13 min Charges:  OT General Charges $OT Visit: 1 Procedure OT Evaluation $OT Eval Moderate Complexity: 1 Procedure G-Codes:     Josia Cueva A. Brett Albino, M.S., OTR/L Pager: [phone]  Gaye Alken 10/12/2016, 4:21 PM

## 2016-10-13 ENCOUNTER — Inpatient Hospital Stay (HOSPITAL_COMMUNITY): Payer: Medicare Other

## 2016-10-13 LAB — BASIC METABOLIC PANEL
Anion gap: 7 (ref 5–15)
BUN: 32 mg/dL — ABNORMAL HIGH (ref 6–20)
CO2: 22 mmol/L (ref 22–32)
Calcium: 8.7 mg/dL — ABNORMAL LOW (ref 8.9–10.3)
Chloride: 106 mmol/L (ref 101–111)
Creatinine, Ser: 1.55 mg/dL — ABNORMAL HIGH (ref 0.44–1.00)
GFR calc Af Amer: 37 mL/min — ABNORMAL LOW (ref >60)
GFR calc non Af Amer: 32 mL/min — ABNORMAL LOW (ref >60)
Glucose, Bld: 198 mg/dL — ABNORMAL HIGH (ref 65–99)
Potassium: 3.8 mmol/L (ref 3.5–5.1)
Sodium: 135 mmol/L (ref 135–145)

## 2016-10-13 LAB — GLUCOSE, CAPILLARY
Glucose-Capillary: 205 mg/dL — ABNORMAL HIGH (ref 65–99)
Glucose-Capillary: 246 mg/dL — ABNORMAL HIGH (ref 65–99)
Glucose-Capillary: 253 mg/dL — ABNORMAL HIGH (ref 65–99)
Glucose-Capillary: 126 mg/dL — ABNORMAL HIGH (ref 65–99)

## 2016-10-13 MED ORDER — INSULIN GLARGINE 100 UNIT/ML ~~LOC~~ SOLN
15.0000 [IU] | Freq: Every day | SUBCUTANEOUS | Status: DC
  Administered 2016-10-13 – 2016-10-15 (×3): 15 [IU] via SUBCUTANEOUS
  Filled 2016-10-13 (×3): qty 0.15

## 2016-10-13 MED ORDER — INSULIN ASPART 100 UNIT/ML ~~LOC~~ SOLN
0.0000 [IU] | Freq: Three times a day (TID) | SUBCUTANEOUS | Status: DC
  Administered 2016-10-13: 5 [IU] via SUBCUTANEOUS
  Administered 2016-10-13: 8 [IU] via SUBCUTANEOUS
  Administered 2016-10-14 – 2016-10-15 (×3): 3 [IU] via SUBCUTANEOUS
  Administered 2016-10-15: 8 [IU] via SUBCUTANEOUS

## 2016-10-13 NOTE — Progress Notes (Signed)
Occupational Therapy Treatment Patient Details Name: Emily Miranda MRN: [id number] DOB: 24-Jan-1941 Today's Date: 10/13/2016    History of present illness 76 y.o. admitted with CP and NSTEMI, unable to cath 5/30 due to SOB supine. PMhx: DM, HTN, AS, HF, anemia, charcot foot   OT comments  Pt progressing. Education provided in session. Feel pt will continue to benefit from acute OT to increase independence prior to d/c.   Follow Up Recommendations  No OT follow up;Supervision/Assistance - 24 hour    Equipment Recommendations  3 in 1 bedside commode    Recommendations for Other Services      Precautions / Restrictions Precautions Precautions: Fall Restrictions Weight Bearing Restrictions: Yes RLE Weight Bearing: Weight bearing as tolerated       Mobility Bed Mobility Overal bed mobility: Needs Assistance Bed Mobility: Supine to Sit;Sit to Supine     Supine to sit: Supervision Sit to supine: Supervision      Transfers Overall transfer level: Needs assistance Equipment used: Rolling walker (2 wheeled) Transfers: Sit to/from Stand Sit to Stand: Mod assist         General transfer comment: Assist for balance as pt went to sitting position to 3 in 1. Pt also went sit to stand and stand to sit at supervision/setup level.  Cues for hand position.    Balance Overall balance assessment: Needs assistance      Pt with LOB as she was trying to manage underwear-Mod A given.                                    ADL either performed or assessed with clinical judgement   ADL Overall ADL's : Needs assistance/impaired Eating/Feeding: Independent;Sitting   Grooming: Wash/dry face;Brushing hair;Sitting;Standing;Set up;Supervision/safety   Upper Body Bathing: Set up;Supervision/ safety;Sitting;Standing   Lower Body Bathing: Set up;Supervison/ safety;Sit to/from stand   Upper Body Dressing : Minimal assistance;Sitting;Standing Upper Body Dressing Details  (indicate cue type and reason): assisted with gown Lower Body Dressing: Moderate assistance;Sit to/from stand Lower Body Dressing Details (indicate cue type and reason): Mod A for balance and also assisted with donning one sock; pt also changed pad in panties Toilet Transfer: Moderate assistance;Ambulation;RW Toilet Transfer Details (indicate cue type and reason): Mod A for LOB as pt was managing LB clothing and then went to sitting position.   Toileting - Clothing Manipulation Details (indicate cue type and reason): see LB dressing comments     Functional mobility during ADLs: Rolling walker (MIn guard for ambulation; LOB requiring Mod A during ADL) General ADL Comments: Educated on energy conservation. Discussed using chair for showering.      Vision       Perception     Praxis      Cognition Arousal/Alertness: Awake/alert Behavior During Therapy: WFL for tasks assessed/performed; flat affect Overall Cognitive Status: Within Functional Limits for tasks assessed                                          Exercises     Shoulder Instructions       General Comments      Pertinent Vitals/ Pain       Pain Assessment: 0-10 Pain Score:  (6 in head and 8 in legs) Pain Location: bilateral legs and head Pain Descriptors / Indicators: Aching;Headache Pain  Intervention(s): Monitored during session  Home Living                                          Prior Functioning/Environment              Frequency  Min 2X/week        Progress Toward Goals  OT Goals(current goals can now be found in the care plan section)  Progress towards OT goals: Progressing toward goals  Acute Rehab OT Goals Patient Stated Goal: get stronger OT Goal Formulation: With patient Time For Goal Achievement: 10/26/16 Potential to Achieve Goals: Good ADL Goals Pt Will Perform Upper Body Bathing: with modified independence;sitting;standing Pt Will Perform  Lower Body Bathing: with modified independence;sit to/from stand Pt Will Transfer to Toilet: with modified independence;ambulating;regular height toilet Pt Will Perform Toileting - Clothing Manipulation and hygiene: with modified independence;sit to/from stand Pt Will Perform Tub/Shower Transfer: with supervision;Tub transfer;ambulating;3 in 1;rolling walker Additional ADL Goal #1: Pt will independently verbally recall 3 energy conservation strategies and use during ADL.  Plan Discharge plan remains appropriate    Co-evaluation                 AM-PAC PT "6 Clicks" Daily Activity     Outcome Measure   Help from another person eating meals?: None Help from another person taking care of personal grooming?: A Little Help from another person toileting, which includes using toliet, bedpan, or urinal?: A Lot Help from another person bathing (including washing, rinsing, drying)?: A Little Help from another person to put on and taking off regular upper body clothing?: A Little Help from another person to put on and taking off regular lower body clothing?: A Lot 6 Click Score: 17    End of Session Equipment Utilized During Treatment: Rolling walker;Oxygen  OT Visit Diagnosis: Unsteadiness on feet (R26.81)   Activity Tolerance Patient tolerated treatment well   Patient Left in bed;with call bell/phone within reach;with bed alarm set   Nurse Communication          Time: 4098-1191 OT Time Calculation (min): 36 min  Charges: OT General Charges $OT Visit: 1 Procedure OT Treatments $Self Care/Home Management : 23-37 mins   Adriann Ballweg L Latoy Labriola OTR/L 10/13/2016, 9:03 AM

## 2016-10-13 NOTE — Progress Notes (Addendum)
PROGRESS NOTE                                                                                                                                                                                                             Patient Demographics:    Emily Miranda, is a 76 y.o. female, DOB - Jul 25, 1940, ZOX:[id number]  Admit date - 10/06/2016   Admitting Physician Hillis Range, MD  Outpatient Primary MD for the patient is Noni Saupe, MD  LOS - 7  Outpatient Specialists:  No chief complaint on file.      Brief Narrative   76 year old female with history of diastolic CHF, moderate aortic stenosis, hypertension, uncontrolled diabetes mellitus recently started on insulin, neuropathy, chronic kidney disease stage III was transferred to [facility] ED for NSTEMI and acute CHF.   Subjective:   Patient seen and examined this morning. Dyspnea is better today   Assessment  & Plan :    Principal Problem:   Non-ST elevation (NSTEMI) myocardial infarction (HCC) EF of 40-45% per echo from March this year.   Cardiac cath showing three-vessel coronary artery disease including diffuse LAD disease. Cardiology recommends medical therapy given improvement in her chest pain and shortness of breath with Diuretics and blood transfusion. Continue aspirin, statin, metoprolol and Imdur.    Active Problems: Acute on chronic systolic CHF Echo with EF of 40-45%. Diuresing well on current dose of IV Lasix. Also received her dose of metolazone yesterday. Increased her Imdur dose. Monitor strict I/O and daily weights.( appears she still almost 10 pounds above her baseline weight ). Continue aspirin, beta blocker and Imdur.  Anemia of chronic kidney disease Receives Aranesp injections as outpatient. Noted for dropping hemoglobin to 7.5, improved with 1 unit PRBC.    DM type 2 causing CKD stage 3 (HCC) A1c of 11.3.  Creatinine mildly elevated today. CBG in 200s. Increase Lantus dose to 15 units at bedtime and switch to moderate sliding scale coverage.   Diabetic neuropathy with leg cramps Started on Flexeril.  Elevated TSH and free T4 Possibly due to acute illness. Normal free T3. Monitor as outpatient.  Right leg swelling. Patient was evaluated as outpatient with negative venous Doppler for DVT. Right lower extremity arterial Doppler showed multiple peripheral vascular lesions including significant stenosis of SFA and right distal ATA.  Needs outpatient referral to vascular surgery.    Code Status : Full code  Family Communication  : None at bedside  Disposition Plan  : Possibly discharge in the next 24-48 hours if continues to diabetes well.  Barriers For Discharge : Active symptoms   Consults  :  Cardiology  Procedures  :  Left and right heart catheterization  DVT Prophylaxis  : IV heparin  Lab Results  Component Value Date   PLT 479 (H) 10/12/2016    Antibiotics  :  None  Anti-infectives    None        Objective:   Vitals:   10/12/16 2114 10/13/16 0249 10/13/16 0901 10/13/16 0902  BP:  133/77 137/71 137/71  Pulse: (!) 104 84  96  Resp:  (!) 25    Temp:  98.5 F (36.9 C)    TempSrc:      SpO2:  97%    Weight:  65.6 kg (144 lb 9.6 oz)    Height:        Wt Readings from Last 3 Encounters:  10/13/16 65.6 kg (144 lb 9.6 oz)  08/27/16 57.2 kg (126 lb 3.2 oz)  08/05/16 62.8 kg (138 lb 8 oz)     Intake/Output Summary (Last 24 hours) at 10/13/16 1106 Last data filed at 10/13/16 1002  Gross per 24 hour  Intake              693 ml  Output             3300 ml  Net            -2607 ml     Physical Exam Gen.: Elderly female not in distress HEENT: Moist mucosa, supple neck Chest: Fine bibasilar crackles (improved from yesterday) CVS: Normal S1 and S2, no murmurs GI: Soft, nondistended, nontender Musculoskeletal: Warm, 1+ pitting edema on the left, increased  swelling and pitting on the right.       Data Review:    CBC  Recent Labs Lab 10/09/16 0428 10/10/16 0319 10/10/16 1554 10/11/16 0826 10/12/16 0632  WBC 7.3 7.3 9.5 7.9 8.6  HGB 8.2* 7.5* 9.9* 9.4* 9.5*  HCT 27.1* 25.1* 31.9* 31.1* 30.6*  PLT 538* 478* 499* 425* 479*  MCV 82.1 81.5 83.1 83.2 83.2  MCH 24.8* 24.4* 25.8* 25.1* 25.8*  MCHC 30.3 29.9* 31.0 30.2 31.0  RDW 19.9* 19.8* 19.3* 19.5* 19.7*    Chemistries   Recent Labs Lab 10/06/16 1517  10/08/16 6045 10/09/16 0428 10/10/16 0533 10/11/16 4098 10/11/16 0826 10/12/16 0632 10/13/16 0301  NA  --   < > 136 135 136 135  --  133* 135  K  --   < > 4.3 4.7 4.4 4.3  --  4.3 3.8  CL  --   < > 109 107 107 109  --  105 106  CO2  --   < > 15* 18* 19* 16*  --  17* 22  GLUCOSE  --   < > 149* 220* 192* 157*  --  147* 198*  BUN  --   < > 41* 39* 36* 33*  --  28* 32*  CREATININE  --   < > 1.86* 1.83* 1.53* 1.53*  --  1.37* 1.55*  CALCIUM  --   < > 8.8* 8.8* 9.0 8.8*  --  8.9 8.7*  MG 2.1  --   --   --   --   --   --   --   --  AST  --   --  14* 13*  --  14*  --   --   --   ALT  --   --  11* 8*  --  QUANTITY NOT SUFFICIENT, UNABLE TO PERFORM TEST 10*  --   --   ALKPHOS  --   --  90 91  --  83  --   --   --   BILITOT  --   --  0.4 0.2*  --  QUANTITY NOT SUFFICIENT, UNABLE TO PERFORM TEST 0.5  --   --   < > = values in this interval not displayed. ------------------------------------------------------------------------------------------------------------------ No results for input(s): CHOL, HDL, LDLCALC, TRIG, CHOLHDL, LDLDIRECT in the last 72 hours.  Lab Results  Component Value Date   HGBA1C 11.3 (H) 10/06/2016   ------------------------------------------------------------------------------------------------------------------ No results for input(s): TSH, T4TOTAL, T3FREE, THYROIDAB in the last 72 hours.  Invalid input(s):  FREET3 ------------------------------------------------------------------------------------------------------------------ No results for input(s): VITAMINB12, FOLATE, FERRITIN, TIBC, IRON, RETICCTPCT in the last 72 hours.  Coagulation profile  Recent Labs Lab 10/07/16 2010  INR 1.19    No results for input(s): DDIMER in the last 72 hours.  Cardiac Enzymes  Recent Labs Lab 10/06/16 1517 10/06/16 2031 10/07/16 0233  TROPONINI 0.83* 0.79* 0.79*   ------------------------------------------------------------------------------------------------------------------    Component Value Date/Time   BNP 409.2 (H) 08/27/2016 1514    Inpatient Medications  Scheduled Meds: . amLODipine  5 mg Oral Daily  . aspirin  81 mg Oral Daily  . atorvastatin  40 mg Oral q1800  . cyclobenzaprine  5 mg Oral TID  . docusate sodium  100 mg Oral BID  . enoxaparin (LOVENOX) injection  40 mg Subcutaneous Q24H  . furosemide  40 mg Intravenous BID  . insulin aspart  0-5 Units Subcutaneous QHS  . insulin aspart  0-9 Units Subcutaneous TID WC  . insulin glargine  10 Units Subcutaneous QHS  . isosorbide mononitrate  60 mg Oral Daily  . mouth rinse  15 mL Mouth Rinse BID  . metoprolol tartrate  12.5 mg Oral BID  . pantoprazole  40 mg Oral Daily  . sodium chloride flush  3 mL Intravenous Q12H   Continuous Infusions: . sodium chloride     PRN Meds:.sodium chloride, acetaminophen, ALPRAZolam, artificial tears, fluticasone, nitroGLYCERIN, ondansetron (ZOFRAN) IV, polyethylene glycol, sodium chloride flush, traMADol  Micro Results No results found for this or any previous visit (from the past 240 hour(s)).  Radiology Reports Dg Chest Port 1 View  Result Date: 10/06/2016 CLINICAL DATA:  Shortness of breath with left-sided chest pain. History of hypertension, diabetes and congestive heart failure. EXAM: PORTABLE CHEST 1 VIEW COMPARISON:  Radiographs 10/06/2016.  CT 08/01/2016. FINDINGS: 1655 hour. The  heart size and mediastinal contours are stable. There are small bilateral pleural effusions with mild bibasilar atelectasis, vascular congestion and probable mild edema. No confluent airspace opacity or pneumothorax. No acute osseous findings are seen. Telemetry leads overlie the chest. IMPRESSION: Probable mild congestive heart failure with mild edema and small bilateral pleural effusions. Electronically Signed   By: Carey Bullocks M.D.   On: 10/06/2016 17:03    Time Spent in minutes 25   Eddie North M.D on 10/13/2016 at 11:06 AM  Between 7am to 7pm - Pager - [phone]  After 7pm go to [email or web address] - password [facility]  [facility] -  Office  [phone]

## 2016-10-13 NOTE — Progress Notes (Signed)
Progress Note  Patient Name: Emily Miranda Date of Encounter: 10/13/2016  Primary Cardiologist: Hochrein  Subjective   Denies chest pain and shortness of breath. Still with bilateral leg swelling, right>left.  Inpatient Medications    Scheduled Meds: . amLODipine  5 mg Oral Daily  . aspirin  81 mg Oral Daily  . atorvastatin  40 mg Oral q1800  . cyclobenzaprine  5 mg Oral TID  . docusate sodium  100 mg Oral BID  . enoxaparin (LOVENOX) injection  40 mg Subcutaneous Q24H  . furosemide  40 mg Intravenous BID  . insulin aspart  0-15 Units Subcutaneous TID WC  . insulin aspart  0-5 Units Subcutaneous QHS  . insulin glargine  15 Units Subcutaneous QHS  . isosorbide mononitrate  60 mg Oral Daily  . mouth rinse  15 mL Mouth Rinse BID  . metoprolol tartrate  12.5 mg Oral BID  . pantoprazole  40 mg Oral Daily  . sodium chloride flush  3 mL Intravenous Q12H   Continuous Infusions: . sodium chloride     PRN Meds: sodium chloride, acetaminophen, ALPRAZolam, artificial tears, fluticasone, nitroGLYCERIN, ondansetron (ZOFRAN) IV, polyethylene glycol, sodium chloride flush, traMADol   Vital Signs    Vitals:   10/13/16 0249 10/13/16 0901 10/13/16 0902 10/13/16 1100  BP: 133/77 137/71 137/71   Pulse: 84  96 (!) 105  Resp: (!) 25   (!) 22  Temp: 98.5 F (36.9 C)   98.3 F (36.8 C)  TempSrc:    Axillary  SpO2: 97%     Weight: 144 lb 9.6 oz (65.6 kg)     Height:        Intake/Output Summary (Last 24 hours) at 10/13/16 1144 Last data filed at 10/13/16 1002  Gross per 24 hour  Intake              693 ml  Output             3300 ml  Net            -2607 ml   Filed Weights   10/11/16 0500 10/12/16 0206 10/13/16 0249  Weight: 147 lb 3.2 oz (66.8 kg) 146 lb 1.6 oz (66.3 kg) 144 lb 9.6 oz (65.6 kg)    Telemetry    Sinus rhythm and sinus tachycardia with PVC's and insignificant pauses < 2 secs - Personally Reviewed  ECG    Physical Exam   Neck:No JVD Cardiac:RRR,  2/6 ejection systolic murmur loudest over RUSB, no rubs, or gallops.  Respiratory:Diminished at bases and diminished on right, +bibasilar crackles (faint). TH:YHOO, nontender, non-distended  MS:3+ pitting b/l pretibial edema (right>left); No deformity. Neuro:Nonfocal  Psych: Normal affect    Labs    Chemistry Recent Labs Lab 10/08/16 [phone] 10/09/16 0428  10/11/16 9728 10/11/16 0826 10/12/16 0632 10/13/16 0301  NA 136 135  < > 135  --  133* 135  K 4.3 4.7  < > 4.3  --  4.3 3.8  CL 109 107  < > 109  --  105 106  CO2 15* 18*  < > 16*  --  17* 22  GLUCOSE 149* 220*  < > 157*  --  147* 198*  BUN 41* 39*  < > 33*  --  28* 32*  CREATININE 1.86* 1.83*  < > 1.53*  --  1.37* 1.55*  CALCIUM 8.8* 8.8*  < > 8.8*  --  8.9 8.7*  PROT 6.5 6.7  --  6.5  --   --   --  ALBUMIN 2.0* 1.9*  --  1.9*  --   --   --   AST 14* 13*  --  14*  --   --   --   ALT 11* 8*  --  QUANTITY NOT SUFFICIENT, UNABLE TO PERFORM TEST 10*  --   --   ALKPHOS 90 91  --  83  --   --   --   BILITOT 0.4 0.2*  --  QUANTITY NOT SUFFICIENT, UNABLE TO PERFORM TEST 0.5  --   --   GFRNONAA 26* 26*  < > 32*  --  37* 32*  GFRAA 30* 30*  < > 38*  --  43* 37*  ANIONGAP 12 10  < > 10  --  11 7  < > = values in this interval not displayed.   Hematology Recent Labs Lab 10/10/16 1554 10/11/16 0826 10/12/16 0632  WBC 9.5 7.9 8.6  RBC 3.84* 3.74* 3.68*  HGB 9.9* 9.4* 9.5*  HCT 31.9* 31.1* 30.6*  MCV 83.1 83.2 83.2  MCH 25.8* 25.1* 25.8*  MCHC 31.0 30.2 31.0  RDW 19.3* 19.5* 19.7*  PLT 499* 425* 479*    Cardiac Enzymes Recent Labs Lab 10/06/16 1517 10/06/16 2031 10/07/16 0233  TROPONINI 0.83* 0.79* 0.79*   No results for input(s): TROPIPOC in the last 168 hours.   BNPNo results for input(s): BNP, PROBNP in the last 168 hours.   DDimer No results for input(s): DDIMER in the last 168 hours.   Radiology    No results found.  Cardiac Studies   Cath (10/11/16):  Conclusions: 1. Three vessel coronary  artery disease, including diffuse LAD disease with sequential 50% lesions, sequential 70% and 60% mid LCx stenosis, 50% mid RCA lesion, and 90% stenosis in midportion of small rPDA. 2. Mildly elevated left ventricular filling pressure. 3. Moderately elevated aortic valve gradient (peak-to-peak gradient 25 mmHg).  Recommendations: 1. Medical therapy given improvement of chest pain and shortness with blood transfusion and diuresis. 2. If symptoms worsen despite stable hemoglobin and optimized volume status, FFR/PCI to the LCx could be performed. I would defer PCI to PDA given its small size.  Patient Profile     76 y.o. female with history of mild to moderate aortic stenosis, hypertension, diabetes and mixed systolic and diastolic heart failure. She also has moderate renal insufficiency. She was admitted with unstable angina. Her enzymes are low and flat.   Assessment & Plan    1. NSTEMI: Symptomatically stable. Cath results reviewed above. Medical management recommended. I increased Imdur to 60 mg on 6/2. Will continue to diurese. Continue ASA, Lipitor, and metoprolol.  2. Acute on chronic combined systolic and diastolic heart failure, LVEF 40-45%: Nearly 2.4 L output in past 24 hrs with IV Lasix and metolazone. Small rise in BUN/creatinine (32/1.55). Continue IV Lasix. No metolazone today.  3. Hypertension: Controlled today. Will monitor.  4. HLD: Continue Lipitor.  5. Aortic stenosis: Mild by echo in March 2018.  6. Anemia: Received PRBC transfusion. Aranesp as outpatient.  7. PVD: Needs outpatient follow up. Continue ASA and statin.  8. CKD stage 3: Mild elevation in BUN/creatinine (32/1.55). Monitor given diuretic requirement.  Signed, Prentice Docker, MD  10/13/2016, 11:44 AM

## 2016-10-14 DIAGNOSIS — I25110 Atherosclerotic heart disease of native coronary artery with unstable angina pectoris: Secondary | ICD-10-CM

## 2016-10-14 LAB — BASIC METABOLIC PANEL
Anion gap: 9 (ref 5–15)
BUN: 30 mg/dL — ABNORMAL HIGH (ref 6–20)
CO2: 22 mmol/L (ref 22–32)
Calcium: 8.7 mg/dL — ABNORMAL LOW (ref 8.9–10.3)
Chloride: 103 mmol/L (ref 101–111)
Creatinine, Ser: 1.35 mg/dL — ABNORMAL HIGH (ref 0.44–1.00)
GFR calc Af Amer: 44 mL/min — ABNORMAL LOW (ref >60)
GFR calc non Af Amer: 38 mL/min — ABNORMAL LOW (ref >60)
Glucose, Bld: 111 mg/dL — ABNORMAL HIGH (ref 65–99)
Potassium: 3.8 mmol/L (ref 3.5–5.1)
Sodium: 134 mmol/L — ABNORMAL LOW (ref 135–145)

## 2016-10-14 LAB — GLUCOSE, CAPILLARY
Glucose-Capillary: 105 mg/dL — ABNORMAL HIGH (ref 65–99)
Glucose-Capillary: 179 mg/dL — ABNORMAL HIGH (ref 65–99)
Glucose-Capillary: 198 mg/dL — ABNORMAL HIGH (ref 65–99)
Glucose-Capillary: 209 mg/dL — ABNORMAL HIGH (ref 65–99)

## 2016-10-14 NOTE — Progress Notes (Signed)
Text paged Dr. Gonzella Lex earlier about pt refusal for home O2. on discharge.

## 2016-10-14 NOTE — Care Management Note (Addendum)
Case Management Note  Patient Details  Name: Emily Miranda MRN: [id number] Date of Birth: 09/09/1940  Subjective/Objective: Pt presented for  Nstemi. Pt is from home with husband and son. Plan for d/c home 10-14-16.                    Action/Plan: Pt is Refusing HH Services and Home 02 at this time. Pt has qualified for home 02 and she feels that she will not need. CM did discuss if she was to get into respiratory distress to call 911 immediately. Pt felt like she would be fine. No services or DME set up. Staff RN aware and to make physician aware.   Expected Discharge Date:                  Expected Discharge Plan:  Home/Self Care  In-House Referral:  NA  Discharge planning Services  CM Consult  Post Acute Care Choice:  Durable Medical Equipment, Home Health Choice offered to:  Patient  DME Arranged:  N/A DME Agency: N/A  HH Arranged:  Registered Nurse [facility] Agency:[facility]  Status of Service:  Completed, signed off  If discussed at Long Length of Stay Meetings, dates discussed:    Additional Comments: 1030 10-16-16 Tomi Bamberger, RN, BSN [phone] Pt now willing to accept [facility] RN Services. Pt has used [facility] in the past and wants to use again. Agency list offered pt declined. CM did make referral with [facility]. SOC to begin within 24-48 hours post d/c. Pt did not qualify for home 02. Per RN patient saturations are WNL. No further needs from CM at this time.   1453 10-15-16 Tomi Bamberger, RN,BSN [phone] CM was told by Physician that pt is now agreeable to home 02. CM did speak with patient and she states she is agreeable to home 02 and wants to use [facility] for DME. CM did make referral to Lupita Leash with [facility]. Pt will need Oxygen Order and new Pulmonary Sat note for [facility] to process. No further needs from CM at this time.  Gala Lewandowsky, RN 10/14/2016, 2:47 PM

## 2016-10-14 NOTE — Progress Notes (Signed)
PROGRESS NOTE                                                                                                                                                                                                             Patient Demographics:    Emily Miranda, is a 76 y.o. female, DOB - [birth year], ZOX:[id number]  Admit date - 10/06/2016   Admitting Physician Hillis Range, MD  Outpatient Primary MD for the patient is Noni Saupe, MD  LOS - 8  Outpatient Specialists:  No chief complaint on file.      Brief Narrative   76 year old female with history of diastolic CHF, moderate aortic stenosis, hypertension, uncontrolled diabetes mellitus recently started on insulin, neuropathy, chronic kidney disease stage III was [facility] ED for NSTEMI and acute CHF.   Subjective:   Dyspnea improving. wants to go home   Assessment  & Plan :    Principal Problem:   Non-ST elevation (NSTEMI) myocardial infarction (HCC) EF of 40-45% per echo from March this year.   Cardiac cath showing three-vessel coronary artery disease including diffuse LAD disease. Cardiology recommends medical therapy given improvement in her chest pain and shortness of breath with Diuretics and blood transfusion. Continue aspirin, statin, metoprolol and Imdur.    Active Problems: Acute on chronic systolic CHF Echo with EF of 40-45%. Diuresing well on IV lasix. Wt still about 10lbs over from baseline. Marland Kitchen Possible transition to po lasix tomorrow. Continue aspirin, beta blocker and Imdur. Will need home o2 on ambulation.  Anemia of chronic kidney disease Receives Aranesp injections as outpatient. Noted for dropping hemoglobin to 7.5, improved with 1 unit PRBC.    DM type 2 causing CKD stage 3 (HCC) A1c of 11.3. CBG better after lantus dose increased.   Diabetic neuropathy with leg cramps Started on  Flexeril.  Elevated TSH and free T4 Possibly due to acute illness. Normal free T3. Monitor as outpatient.  Right leg swelling. Patient was evaluated as outpatient with negative venous Doppler for DVT. Right lower extremity arterial Doppler showed multiple peripheral vascular lesions including significant stenosis of SFA and right distal ATA. Needs outpatient referral to vascular surgery.    Code Status : Full code  Family Communication  : None at bedside  Disposition Plan  : Possibly discharge by 6/6  if diuresing well.  Barriers For Discharge : Active symptoms   Consults  :  Cardiology  Procedures  :  Left and right heart catheterization  DVT Prophylaxis  : sq lovenox  Lab Results  Component Value Date   PLT 479 (H) 10/12/2016    Antibiotics  :  None  Anti-infectives    None        Objective:   Vitals:   10/13/16 1954 10/13/16 2028 10/14/16 0622 10/14/16 0803  BP: (!) 129/59 (!) 131/94 (!) 158/83   Pulse: (!) 59 (!) 102 (!) 111 86  Resp: 19 (!) 28 19   Temp: 98.7 F (37.1 C) 99 F (37.2 C) 98.8 F (37.1 C)   TempSrc:      SpO2: 98% 100% 95%   Weight:   62.9 kg (138 lb 11.2 oz)   Height:        Wt Readings from Last 3 Encounters:  10/14/16 62.9 kg (138 lb 11.2 oz)  08/27/16 57.2 kg (126 lb 3.2 oz)  08/05/16 62.8 kg (138 lb 8 oz)     Intake/Output Summary (Last 24 hours) at 10/14/16 1510 Last data filed at 10/14/16 1100  Gross per 24 hour  Intake              690 ml  Output             2551 ml  Net            -1861 ml     Physical Exam gen : NAD HEENT: moist mucosa, supple neck Chest: fine bibasilar crackles CVS: NS1&S2, no murmurs GI: soft, NT, ND Musculoskeletal: Warm, 1+ pitting edema on the left,  swelling and pitting on the right.         Data Review:    CBC  Recent Labs Lab 10/09/16 0428 10/10/16 0319 10/10/16 1554 10/11/16 0826 10/12/16 0632  WBC 7.3 7.3 9.5 7.9 8.6  HGB 8.2* 7.5* 9.9* 9.4* 9.5*  HCT 27.1* 25.1*  31.9* 31.1* 30.6*  PLT 538* 478* 499* 425* 479*  MCV 82.1 81.5 83.1 83.2 83.2  MCH 24.8* 24.4* 25.8* 25.1* 25.8*  MCHC 30.3 29.9* 31.0 30.2 31.0  RDW 19.9* 19.8* 19.3* 19.5* 19.7*    Chemistries   Recent Labs Lab 10/08/16 0938 10/09/16 0428 10/10/16 0533 10/11/16 0607 10/11/16 0826 10/12/16 0632 10/13/16 0301 10/14/16 0415  NA 136 135 136 135  --  133* 135 134*  K 4.3 4.7 4.4 4.3  --  4.3 3.8 3.8  CL 109 107 107 109  --  105 106 103  CO2 15* 18* 19* 16*  --  17* 22 22  GLUCOSE 149* 220* 192* 157*  --  147* 198* 111*  BUN 41* 39* 36* 33*  --  28* 32* 30*  CREATININE 1.86* 1.83* 1.53* 1.53*  --  1.37* 1.55* 1.35*  CALCIUM 8.8* 8.8* 9.0 8.8*  --  8.9 8.7* 8.7*  AST 14* 13*  --  14*  --   --   --   --   ALT 11* 8*  --  QUANTITY NOT SUFFICIENT, UNABLE TO PERFORM TEST 10*  --   --   --   ALKPHOS 90 91  --  83  --   --   --   --   BILITOT 0.4 0.2*  --  QUANTITY NOT SUFFICIENT, UNABLE TO PERFORM TEST 0.5  --   --   --    ------------------------------------------------------------------------------------------------------------------ No results for input(s): CHOL, HDL, LDLCALC,  TRIG, CHOLHDL, LDLDIRECT in the last 72 hours.  Lab Results  Component Value Date   HGBA1C 11.3 (H) 10/06/2016   ------------------------------------------------------------------------------------------------------------------ No results for input(s): TSH, T4TOTAL, T3FREE, THYROIDAB in the last 72 hours.  Invalid input(s): FREET3 ------------------------------------------------------------------------------------------------------------------ No results for input(s): VITAMINB12, FOLATE, FERRITIN, TIBC, IRON, RETICCTPCT in the last 72 hours.  Coagulation profile  Recent Labs Lab 10/07/16 2010  INR 1.19    No results for input(s): DDIMER in the last 72 hours.  Cardiac Enzymes No results for input(s): CKMB, TROPONINI, MYOGLOBIN in the last 168 hours.  Invalid input(s):  CK ------------------------------------------------------------------------------------------------------------------    Component Value Date/Time   BNP 409.2 (H) 08/27/2016 1514    Inpatient Medications  Scheduled Meds: . amLODipine  5 mg Oral Daily  . aspirin  81 mg Oral Daily  . atorvastatin  40 mg Oral q1800  . cyclobenzaprine  5 mg Oral TID  . docusate sodium  100 mg Oral BID  . enoxaparin (LOVENOX) injection  40 mg Subcutaneous Q24H  . furosemide  40 mg Intravenous BID  . insulin aspart  0-15 Units Subcutaneous TID WC  . insulin aspart  0-5 Units Subcutaneous QHS  . insulin glargine  15 Units Subcutaneous QHS  . isosorbide mononitrate  60 mg Oral Daily  . mouth rinse  15 mL Mouth Rinse BID  . metoprolol tartrate  12.5 mg Oral BID  . pantoprazole  40 mg Oral Daily  . sodium chloride flush  3 mL Intravenous Q12H   Continuous Infusions: . sodium chloride     PRN Meds:.sodium chloride, acetaminophen, ALPRAZolam, artificial tears, fluticasone, nitroGLYCERIN, ondansetron (ZOFRAN) IV, polyethylene glycol, sodium chloride flush, traMADol  Micro Results No results found for this or any previous visit (from the past 240 hour(s)).  Radiology Reports Dg Chest 2 View  Result Date: 10/13/2016 CLINICAL DATA:  CHF, chest tightness EXAM: CHEST  2 VIEW COMPARISON:  10/06/2016 FINDINGS: Small bilateral pleural effusions. Bibasilar atelectasis or infiltrates. Mild cardiomegaly. No acute bony abnormality. IMPRESSION: Small bilateral pleural effusions with bibasilar atelectasis or infiltrate. Electronically Signed   By: Charlett Nose M.D.   On: 10/13/2016 14:18   Dg Chest Port 1 View  Result Date: 10/06/2016 CLINICAL DATA:  Shortness of breath with left-sided chest pain. History of hypertension, diabetes and congestive heart failure. EXAM: PORTABLE CHEST 1 VIEW COMPARISON:  Radiographs 10/06/2016.  CT 08/01/2016. FINDINGS: 1655 hour. The heart size and mediastinal contours are stable. There  are small bilateral pleural effusions with mild bibasilar atelectasis, vascular congestion and probable mild edema. No confluent airspace opacity or pneumothorax. No acute osseous findings are seen. Telemetry leads overlie the chest. IMPRESSION: Probable mild congestive heart failure with mild edema and small bilateral pleural effusions. Electronically Signed   By: Carey Bullocks M.D.   On: 10/06/2016 17:03    Time Spent in minutes 25   Eddie North M.D on 10/14/2016 at 3:10 PM  Between 7am to 7pm - Pager - [phone]  After 7pm go to [email or web address] - password [facility]  [facility] -  Office  [phone]

## 2016-10-14 NOTE — Care Management Important Message (Signed)
Important Message  Patient Details  Name: LASHUNA GUNNARSON MRN: [id number] Date of Birth: 10-Aug-1940   Medicare Important Message Given:  Yes    Kyla Balzarine 10/14/2016, 12:37 PM

## 2016-10-14 NOTE — Consult Note (Addendum)
   [facility] CM Inpatient Consult   10/14/2016  TINITA EDKINS 03/10/1941 [id number]     [facility] Care Management follow up.   Went to bedside to speak with Mrs. Gering to remind her that [facility] Care Management will follow up post discharge. However, Mrs. Gierke was resting.  Spoke with Mr. Hull (husband) at bedside who indicates the discharge plan is for Mrs.Santa to return home. Made him aware that [facility] Care Management will follow up post discharge.   Provided contact information and pointed out [facility] packet that was on patient's bedside table.    Will make referral for [facility] RNCM follow up for chronic disease management and education for CHF, DM, HTN.   Raiford Noble, MSN-Ed, RN,BSN [facility] Liaison [phone]

## 2016-10-14 NOTE — Progress Notes (Addendum)
The patient has been seen in conjunction with Geoffry Paradise, NP. All aspects of care have been considered and discussed. The patient has been personally interviewed, examined, and all clinical data has been reviewed.   Overall improving. Kidney function is relatively stable and therefore agree with continued IV diuresis.  Plan anti-ischemic regimen will be beta blocker and long-acting nitrates.  Possibly switch to oral diuretic therapy in a.m. She has desaturation with ambulation down to 87%. Refusing O2 therapy.   Progress Note  Patient Name: Emily Miranda Date of Encounter: 10/14/2016  Primary Cardiologist: Hochrein   Subjective   No chest pain or shortness of breath today.   Inpatient Medications    Scheduled Meds: . amLODipine  5 mg Oral Daily  . aspirin  81 mg Oral Daily  . atorvastatin  40 mg Oral q1800  . cyclobenzaprine  5 mg Oral TID  . docusate sodium  100 mg Oral BID  . enoxaparin (LOVENOX) injection  40 mg Subcutaneous Q24H  . furosemide  40 mg Intravenous BID  . insulin aspart  0-15 Units Subcutaneous TID WC  . insulin aspart  0-5 Units Subcutaneous QHS  . insulin glargine  15 Units Subcutaneous QHS  . isosorbide mononitrate  60 mg Oral Daily  . mouth rinse  15 mL Mouth Rinse BID  . metoprolol tartrate  12.5 mg Oral BID  . pantoprazole  40 mg Oral Daily  . sodium chloride flush  3 mL Intravenous Q12H   Continuous Infusions: . sodium chloride     PRN Meds: sodium chloride, acetaminophen, ALPRAZolam, artificial tears, fluticasone, nitroGLYCERIN, ondansetron (ZOFRAN) IV, polyethylene glycol, sodium chloride flush, traMADol   Vital Signs    Vitals:   10/13/16 1954 10/13/16 2028 10/14/16 0622 10/14/16 0803  BP: (!) 129/59 (!) 131/94 (!) 158/83   Pulse: (!) 59 (!) 102 (!) 111 86  Resp: 19 (!) 28 19   Temp: 98.7 F (37.1 C) 99 F (37.2 C) 98.8 F (37.1 C)   TempSrc:      SpO2: 98% 100% 95%   Weight:   138 lb 11.2 oz (62.9 kg)   Height:         Intake/Output Summary (Last 24 hours) at 10/14/16 0853 Last data filed at 10/14/16 0700  Gross per 24 hour  Intake              693 ml  Output             3501 ml  Net            -2808 ml   Filed Weights   10/12/16 0206 10/13/16 0249 10/14/16 0622  Weight: 146 lb 1.6 oz (66.3 kg) 144 lb 9.6 oz (65.6 kg) 138 lb 11.2 oz (62.9 kg)    Telemetry    SR - Personally Reviewed  ECG    N/a  - Personally Reviewed  Physical Exam   General: Well developed, well nourished, female appearing in no acute distress. Wearing [place] Head: Normocephalic, atraumatic.  Neck: Supple without bruits, JVD. Lungs:  Resp regular and unlabored, Diminished bilaterally. Heart: RRR, S1, S2, no S3, S4, 2/6 systolic murmur; no rub. Abdomen: Soft, non-tender, non-distended with normoactive bowel sounds. No hepatomegaly. No rebound/guarding. No obvious abdominal masses. Extremities: No clubbing, cyanosis, R>L LE edema but improving. Distal pedal pulses are 2+ bilaterally. Neuro: Alert and oriented X 3. Moves all extremities spontaneously. Psych: Normal affect.  Labs    Chemistry Recent Labs Lab 10/08/16 [phone] 10/09/16 9604  10/11/16 1610 10/11/16 0826 10/12/16 0632 10/13/16 0301 10/14/16 0415  NA 136 135  < > 135  --  133* 135 134*  K 4.3 4.7  < > 4.3  --  4.3 3.8 3.8  CL 109 107  < > 109  --  105 106 103  CO2 15* 18*  < > 16*  --  17* 22 22  GLUCOSE 149* 220*  < > 157*  --  147* 198* 111*  BUN 41* 39*  < > 33*  --  28* 32* 30*  CREATININE 1.86* 1.83*  < > 1.53*  --  1.37* 1.55* 1.35*  CALCIUM 8.8* 8.8*  < > 8.8*  --  8.9 8.7* 8.7*  PROT 6.5 6.7  --  6.5  --   --   --   --   ALBUMIN 2.0* 1.9*  --  1.9*  --   --   --   --   AST 14* 13*  --  14*  --   --   --   --   ALT 11* 8*  --  QUANTITY NOT SUFFICIENT, UNABLE TO PERFORM TEST 10*  --   --   --   ALKPHOS 90 91  --  83  --   --   --   --   BILITOT 0.4 0.2*  --  QUANTITY NOT SUFFICIENT, UNABLE TO PERFORM TEST 0.5  --   --   --   GFRNONAA 26* 26*   < > 32*  --  37* 32* 38*  GFRAA 30* 30*  < > 38*  --  43* 37* 44*  ANIONGAP 12 10  < > 10  --  11 7 9   < > = values in this interval not displayed.   Hematology Recent Labs Lab 10/10/16 1554 10/11/16 0826 10/12/16 0632  WBC 9.5 7.9 8.6  RBC 3.84* 3.74* 3.68*  HGB 9.9* 9.4* 9.5*  HCT 31.9* 31.1* 30.6*  MCV 83.1 83.2 83.2  MCH 25.8* 25.1* 25.8*  MCHC 31.0 30.2 31.0  RDW 19.3* 19.5* 19.7*  PLT 499* 425* 479*    Cardiac EnzymesNo results for input(s): TROPONINI in the last 168 hours. No results for input(s): TROPIPOC in the last 168 hours.   BNPNo results for input(s): BNP, PROBNP in the last 168 hours.   DDimer No results for input(s): DDIMER in the last 168 hours.    Radiology    Dg Chest 2 View  Result Date: 10/13/2016 CLINICAL DATA:  CHF, chest tightness EXAM: CHEST  2 VIEW COMPARISON:  10/06/2016 FINDINGS: Small bilateral pleural effusions. Bibasilar atelectasis or infiltrates. Mild cardiomegaly. No acute bony abnormality. IMPRESSION: Small bilateral pleural effusions with bibasilar atelectasis or infiltrate. Electronically Signed   By: Charlett Nose M.D.   On: 10/13/2016 14:18    Cardiac Studies   Cath (10/11/16):  Conclusions: 1. Three vessel coronary artery disease, including diffuse LAD disease with sequential 50% lesions, sequential 70% and 60% mid LCx stenosis, 50% mid RCA lesion, and 90% stenosis in midportion of small rPDA. 2. Mildly elevated left ventricular filling pressure. 3. Moderately elevated aortic valve gradient (peak-to-peak gradient 25 mmHg).  Recommendations: 1. Medical therapy given improvement of chest pain and shortness with blood transfusion and diuresis. 2. If symptoms worsen despite stable hemoglobin and optimized volume status, FFR/PCI to the LCx could be performed. I would defer PCI to PDA given its small size.   Coronary Diagrams   Diagnostic Diagram  Patient Profile     76 y.o. female  with history of mild to moderate  aortic stenosis, hypertension, diabetes and mixed systolic and diastolic heart failure. She also has moderate renal insufficiency. She was admitted with unstable angina. Her enzymes are low and flat.   Assessment & Plan    1. NSTEMI: Symptomatically stable. Cath results noted above. Medical management recommended. Imdur was increased to 60 mg on 6/2. No chest pain. Has diuresed well. -- Continue ASA, Lipitor, Imdur and metoprolol.  2. Acute on chronic combined systolic and diastolic heart failure, LVEF 40-45%:3.7 L output in past 24 hrs with IV Lasix. Breathing continues to improve. Weight trending down, appears dry weight was around 126lb at last office visit on 4/18.  -- Cr stable today. -- Continue IV Lasix, may be able to transition to oral in the am.   3. Hypertension: Controlled  4. HLD: Continue Lipitor.  5. Aortic stenosis: Mild by echo in March 2018.  6. Anemia: Received PRBC transfusion. Aranesp as outpatient.  7. PVD: Needs outpatient follow up. Continue ASA and statin.  8. CKD stage 3: Stable this morning. Follow BMET with diuresis.   Signed, Laverda Page, NP  10/14/2016, 8:53 AM

## 2016-10-14 NOTE — Progress Notes (Signed)
SATURATION QUALIFICATIONS: (This note is used to comply with regulatory documentation for home oxygen)  Patient Saturations on Room Air at Rest = 96%  Patient Saturations on Room Air while Ambulating = 87%  Patient Saturations on 2 Liters of oxygen while Ambulating = 94%  Please briefly explain why patient needs home oxygen: 

## 2016-10-14 NOTE — Progress Notes (Signed)
Occupational Therapy Treatment Patient Details Name: Emily Miranda MRN: [id number] DOB: [birth year] Today's Date: 10/14/2016    History of present illness 76 y.o. admitted with CP and NSTEMI, unable to cath 5/30 due to SOB supine. PMhx: DM, HTN, AS, HF, anemia, charcot foot   OT comments  Pt educated on energy conservation and family present for education. Pt very fatigued at present time and falling asleep in session so tub tranfer not at attempted at this time.    Follow Up Recommendations  No OT follow up;Supervision/Assistance - 24 hour    Equipment Recommendations  3 in 1 bedside commode    Recommendations for Other Services      Precautions / Restrictions Precautions Precautions: Fall Precaution Comments: spouse reports april 2018 R foot healing for the first time in months and decr ambulation due to wounds. 71 ' in 2 yrs is the max Restrictions Weight Bearing Restrictions: Yes RLE Weight Bearing: Weight bearing as tolerated       Mobility Bed Mobility                  Transfers                      Balance                                           ADL either performed or assessed with clinical judgement   ADL Overall ADL's : Needs assistance/impaired                                       General ADL Comments: Energy conservation education handout provided and reviewed in detail. OT having patient read handout to help with arousal. Son and spouse present during session., Pt plans to focus on setup in bathroom area and clothing to organize items. Pt normally rides cart at grocery store and family (A) with the household needs. Pt educated on avoiding the hottest point of the day for follow up appointments if possible. Spouse repeating information back to therapist and expressed understanding. Pt fatigued and falling asleep. Tub transfer not completed at this time.      Vision       Perception     Praxis       Cognition Arousal/Alertness: Lethargic Behavior During Therapy: WFL for tasks assessed/performed Overall Cognitive Status: Within Functional Limits for tasks assessed                                          Exercises     Shoulder Instructions       General Comments      Pertinent Vitals/ Pain       Pain Assessment: No/denies pain  Home Living                                          Prior Functioning/Environment              Frequency  Min 2X/week        Progress Toward Goals  OT Goals(current goals can now be found  in the care plan section)  Progress towards OT goals: Progressing toward goals  Acute Rehab OT Goals Patient Stated Goal: get stronger OT Goal Formulation: With patient Time For Goal Achievement: 10/26/16 Potential to Achieve Goals: Good ADL Goals Pt Will Perform Upper Body Bathing: with modified independence;sitting;standing Pt Will Perform Lower Body Bathing: with modified independence;sit to/from stand Pt Will Transfer to Toilet: with modified independence;ambulating;regular height toilet Pt Will Perform Toileting - Clothing Manipulation and hygiene: with modified independence;sit to/from stand Pt Will Perform Tub/Shower Transfer: with supervision;Tub transfer;ambulating;3 in 1;rolling walker Additional ADL Goal #1: Pt will independently verbally recall 3 energy conservation strategies and use during ADL. (met)  Plan Discharge plan remains appropriate    Co-evaluation                 AM-PAC PT "6 Clicks" Daily Activity     Outcome Measure   Help from another person eating meals?: None Help from another person taking care of personal grooming?: A Little Help from another person toileting, which includes using toliet, bedpan, or urinal?: A Lot Help from another person bathing (including washing, rinsing, drying)?: A Little Help from another person to put on and taking off regular upper body  clothing?: A Little Help from another person to put on and taking off regular lower body clothing?: A Lot 6 Click Score: 17    End of Session Equipment Utilized During Treatment: Rolling walker;Oxygen  OT Visit Diagnosis: Unsteadiness on feet (R26.81)   Activity Tolerance Patient tolerated treatment well   Patient Left in bed;with call bell/phone within reach;with bed alarm set   Nurse Communication Mobility status        Time: 8883-5844 OT Time Calculation (min): 10 min  Charges: OT General Charges $OT Visit: 1 Procedure OT Treatments $Self Care/Home Management : 8-22 mins   Jeri Modena   OTR/L Pager: [phone] Office: [phone] .    Parke Poisson B 10/14/2016, 11:51 AM

## 2016-10-15 LAB — BASIC METABOLIC PANEL
Anion gap: 8 (ref 5–15)
BUN: 34 mg/dL — ABNORMAL HIGH (ref 6–20)
CO2: 24 mmol/L (ref 22–32)
Calcium: 8.5 mg/dL — ABNORMAL LOW (ref 8.9–10.3)
Chloride: 104 mmol/L (ref 101–111)
Creatinine, Ser: 1.42 mg/dL — ABNORMAL HIGH (ref 0.44–1.00)
GFR calc Af Amer: 41 mL/min — ABNORMAL LOW (ref >60)
GFR calc non Af Amer: 35 mL/min — ABNORMAL LOW (ref >60)
Glucose, Bld: 133 mg/dL — ABNORMAL HIGH (ref 65–99)
Potassium: 3.9 mmol/L (ref 3.5–5.1)
Sodium: 136 mmol/L (ref 135–145)

## 2016-10-15 LAB — GLUCOSE, CAPILLARY
Glucose-Capillary: 111 mg/dL — ABNORMAL HIGH (ref 65–99)
Glucose-Capillary: 269 mg/dL — ABNORMAL HIGH (ref 65–99)
Glucose-Capillary: 172 mg/dL — ABNORMAL HIGH (ref 65–99)
Glucose-Capillary: 131 mg/dL — ABNORMAL HIGH (ref 65–99)

## 2016-10-15 MED ORDER — HYDROCODONE-ACETAMINOPHEN 5-325 MG PO TABS
1.0000 | ORAL_TABLET | Freq: Four times a day (QID) | ORAL | Status: DC | PRN
  Administered 2016-10-15: 1 via ORAL
  Filled 2016-10-15: qty 1

## 2016-10-15 MED ORDER — CARVEDILOL 6.25 MG PO TABS
6.2500 mg | ORAL_TABLET | Freq: Two times a day (BID) | ORAL | Status: DC
  Administered 2016-10-15 – 2016-10-16 (×2): 6.25 mg via ORAL
  Filled 2016-10-15 (×2): qty 1

## 2016-10-15 MED ORDER — FUROSEMIDE 40 MG PO TABS
40.0000 mg | ORAL_TABLET | Freq: Two times a day (BID) | ORAL | Status: DC
  Administered 2016-10-15 – 2016-10-16 (×2): 40 mg via ORAL
  Filled 2016-10-15 (×2): qty 1

## 2016-10-15 NOTE — Progress Notes (Signed)
Report received in patient's room via Radio broadcast assistant format, reviewed VS, meds, some tests, POC and patient's general condition, assumed care of patient.

## 2016-10-15 NOTE — Plan of Care (Signed)
Problem: Safety: Goal: Ability to remain free from injury will improve Outcome: Completed/Met Date Met: 10/15/16 Patient uses call light or her white phone for assistance as per instruction and has her personal items within reach on her bedside stand.

## 2016-10-15 NOTE — Progress Notes (Signed)
Inpatient Diabetes Program Recommendations  AACE/ADA: New Consensus Statement on Inpatient Glycemic Control (2015)  Target Ranges:  Prepandial:   less than 140 mg/dL      Peak postprandial:   less than 180 mg/dL (1-2 hours)      Critically ill patients:  140 - 180 mg/dL   Results for [name], [name] (MRN [id number]) as of 10/15/2016 11:15  Ref. Range 10/14/2016 07:20 10/14/2016 11:27 10/14/2016 15:58 10/14/2016 20:12 10/15/2016 07:58  Glucose-Capillary Latest Ref Range: 65 - 99 mg/dL 103 (H) 159 (H) 458 (H) 209 (H) 111 (H)  Results for [name], [name] (MRN [id number]) as of 10/15/2016 11:15  Ref. Range 10/13/2016 07:46 10/13/2016 11:10 10/13/2016 16:38 10/13/2016 20:32  Glucose-Capillary Latest Ref Range: 65 - 99 mg/dL 863 (H) 817 (H) 711 (H) 126 (H)   Review of Glycemic Control  Current orders for Inpatient glycemic control: Lantus 15 units daily, Novolog 0-15 units TID with meals, Novolog 0-5 units QHS  Inpatient Diabetes Program Recommendations: Insulin - Meal Coverage: Post prandial glucose is consistently elevated. Please consider ordering Novolog 3 units TID with meals for meal coverage if patient eats at least 50% of meals.  Thanks, Orlando Penner, RN, MSN, CDE Diabetes Coordinator Inpatient Diabetes Program [phone] (Team Pager from 8am to 5pm)

## 2016-10-15 NOTE — Progress Notes (Signed)
The patient has been seen in conjunction with Geoffry Paradise, NP. All aspects of care have been considered and discussed. The patient has been personally interviewed, examined, and all clinical data has been reviewed.   Has not converted to oral diuretic therapy. There is still some mild peripheral edema. Neck veins with the patient sitting in a chair are barely visible above the clavicle. Therefore, it appears overall volume status is reasonable.  If kidney function and other clinical parameters remain stable on the oral medication regimen, she is a likely candidate for discharge tomorrow.  Agree with recommendation to switch metoprolol to carvedilol.   Progress Note  Patient Name: Emily Miranda Date of Encounter: 10/15/2016  Primary Cardiologist: Hochrein  Subjective   No chest pain. Says she continues to feel, but anxious to go home.   Inpatient Medications    Scheduled Meds: . amLODipine  5 mg Oral Daily  . aspirin  81 mg Oral Daily  . atorvastatin  40 mg Oral q1800  . cyclobenzaprine  5 mg Oral TID  . docusate sodium  100 mg Oral BID  . enoxaparin (LOVENOX) injection  40 mg Subcutaneous Q24H  . furosemide  40 mg Oral BID  . insulin aspart  0-15 Units Subcutaneous TID WC  . insulin aspart  0-5 Units Subcutaneous QHS  . insulin glargine  15 Units Subcutaneous QHS  . isosorbide mononitrate  60 mg Oral Daily  . mouth rinse  15 mL Mouth Rinse BID  . metoprolol tartrate  12.5 mg Oral BID  . pantoprazole  40 mg Oral Daily  . sodium chloride flush  3 mL Intravenous Q12H   Continuous Infusions: . sodium chloride     PRN Meds: sodium chloride, acetaminophen, ALPRAZolam, artificial tears, fluticasone, nitroGLYCERIN, ondansetron (ZOFRAN) IV, polyethylene glycol, sodium chloride flush, traMADol   Vital Signs    Vitals:   10/15/16 0800 10/15/16 0900 10/15/16 1000 10/15/16 1100  BP:  130/65    Pulse: 93 89 87 81  Resp: 18 18 18 18   Temp:      TempSrc:      SpO2:  100% 100% 97% 98%  Weight:      Height:        Intake/Output Summary (Last 24 hours) at 10/15/16 1247 Last data filed at 10/15/16 0942  Gross per 24 hour  Intake              240 ml  Output             1350 ml  Net            -1110 ml   Filed Weights   10/13/16 0249 10/14/16 0622 10/15/16 0639  Weight: 144 lb 9.6 oz (65.6 kg) 138 lb 11.2 oz (62.9 kg) 137 lb 9.6 oz (62.4 kg)    Telemetry    SR - Personally Reviewed  ECG    N/A - Personally Reviewed  Physical Exam   General: Well developed, well nourished, female appearing in no acute distress. Wearing [place] Head: Normocephalic, atraumatic.  Neck: Supple without bruits, JVD. Lungs:  Resp regular and unlabored, Diminished in lower lobes. Heart: RRR, S1, S2, no S3, S4, 2/6 murmur; no rub. Abdomen: Soft, non-tender, non-distended with normoactive bowel sounds. No hepatomegaly. No rebound/guarding. No obvious abdominal masses. Extremities: No clubbing, cyanosis, R>L LE edema ( left LE much improved). Distal pedal pulses are 2+ bilaterally. Neuro: Alert and oriented X 3. Moves all extremities spontaneously. Psych: Normal affect.  Labs  Chemistry Recent Labs Lab 10/09/16 0428  10/11/16 6659 10/11/16 0826  10/13/16 0301 10/14/16 0415 10/15/16 0407  NA 135  < > 135  --   < > 135 134* 136  K 4.7  < > 4.3  --   < > 3.8 3.8 3.9  CL 107  < > 109  --   < > 106 103 104  CO2 18*  < > 16*  --   < > 22 22 24   GLUCOSE 220*  < > 157*  --   < > 198* 111* 133*  BUN 39*  < > 33*  --   < > 32* 30* 34*  CREATININE 1.83*  < > 1.53*  --   < > 1.55* 1.35* 1.42*  CALCIUM 8.8*  < > 8.8*  --   < > 8.7* 8.7* 8.5*  PROT 6.7  --  6.5  --   --   --   --   --   ALBUMIN 1.9*  --  1.9*  --   --   --   --   --   AST 13*  --  14*  --   --   --   --   --   ALT 8*  --  QUANTITY NOT SUFFICIENT, UNABLE TO PERFORM TEST 10*  --   --   --   --   ALKPHOS 91  --  83  --   --   --   --   --   BILITOT 0.2*  --  QUANTITY NOT SUFFICIENT, UNABLE TO PERFORM TEST  0.5  --   --   --   --   GFRNONAA 26*  < > 32*  --   < > 32* 38* 35*  GFRAA 30*  < > 38*  --   < > 37* 44* 41*  ANIONGAP 10  < > 10  --   < > 7 9 8   < > = values in this interval not displayed.   Hematology  Recent Labs Lab 10/10/16 1554 10/11/16 0826 10/12/16 0632  WBC 9.5 7.9 8.6  RBC 3.84* 3.74* 3.68*  HGB 9.9* 9.4* 9.5*  HCT 31.9* 31.1* 30.6*  MCV 83.1 83.2 83.2  MCH 25.8* 25.1* 25.8*  MCHC 31.0 30.2 31.0  RDW 19.3* 19.5* 19.7*  PLT 499* 425* 479*    Cardiac EnzymesNo results for input(s): TROPONINI in the last 168 hours. No results for input(s): TROPIPOC in the last 168 hours.   BNPNo results for input(s): BNP, PROBNP in the last 168 hours.   DDimer No results for input(s): DDIMER in the last 168 hours.    Radiology    Dg Chest 2 View  Result Date: 10/13/2016 CLINICAL DATA:  CHF, chest tightness EXAM: CHEST  2 VIEW COMPARISON:  10/06/2016 FINDINGS: Small bilateral pleural effusions. Bibasilar atelectasis or infiltrates. Mild cardiomegaly. No acute bony abnormality. IMPRESSION: Small bilateral pleural effusions with bibasilar atelectasis or infiltrate. Electronically Signed   By: Charlett Nose M.D.   On: 10/13/2016 14:18    Cardiac Studies   Cath (10/11/16):  Conclusions: 1. Three vessel coronary artery disease, including diffuse LAD disease with sequential 50% lesions, sequential 70% and 60% mid LCx stenosis, 50% mid RCA lesion, and 90% stenosis in midportion of small rPDA. 2. Mildly elevated left ventricular filling pressure. 3. Moderately elevated aortic valve gradient (peak-to-peak gradient 25 mmHg).  Recommendations: 4. Medical therapy given improvement of chest pain and shortness with blood transfusion and diuresis. 5.  If symptoms worsen despite stable hemoglobin and optimized volume status, FFR/PCI to the LCx could be performed. I would defer PCI to PDA given its small size.   Coronary Diagrams   Diagnostic Diagram          Patient Profile      76 y.o. female with history of mild to moderate aortic stenosis, hypertension, diabetes and mixed systolic and diastolic heart failure. She also has moderate renal insufficiency. She was admitted with unstable angina. Her enzymes are low and flat. Underwent cath with 3v disease, plan for medical therapy. Diuresing well.   Assessment & Plan    1. NSTEMI: Symptomatically stable. Cath results noted above. Medical management recommended.  No chest pain. Has diuresed well. -- Continue ASA, Lipitor, Imdur and BB. Consider switching BB to coreg given reduced EF for continued medical management.   2. Acute on chronic combined systolic and diastolic heart failure, LVEF 40-45%: Has diuresed a total of 8L this admission with IV Lasix. Breathing continues to improve. Weight trending down, appears dry weight was around 126lb at last office visit on 4/18.  -- Cr stable today. -- will transition to oral lasix 40mg  BID today. Follow weight and I&Os today.   3. Hypertension: Controlled  4. HLD: Continue Lipitor.  5. Aortic stenosis: Mild by echo in March 2018.  6. Anemia: Received PRBC transfusion. Aranesp as outpatient.  7. PVD: Right lower extremity doppler showed lesions in the SFA and distal ATA. Needs outpatient follow up, possibly with Dr. Allyson Sabal. Continue ASA and statin.  8. CKD stage 3: Stable this morning. Follow BMET with diuresis.   Signed, Lesleigh Noe, MD  10/15/2016, 12:47 PM

## 2016-10-15 NOTE — Progress Notes (Addendum)
PROGRESS NOTE                                                                                                                                                                                                             Patient Demographics:    Emily Miranda, is a 76 y.o. female, DOB - 08-05-1940, WJX:[id number]  Admit date - 10/06/2016   Admitting Physician Hillis Range, MD  Outpatient Primary MD for the patient is Noni Saupe, MD  LOS - 9  Outpatient Specialists:  No chief complaint on file.      Brief Narrative   76 year old female with history of diastolic CHF, moderate aortic stenosis, hypertension, uncontrolled diabetes mellitus recently started on insulin, neuropathy, chronic kidney disease stage III was transferred to [facility] from [facility] ED for NSTEMI and acute CHF.   Subjective:   Diffuse symptoms to be better. Very anxious to go home.   Assessment  & Plan :    Principal Problem: Acute on chronic systolic CHF Echo with EF of 40-45%. Diabetes well on IV Lasix but still weight over 10 pounds from baseline (120-125 LBS).  Transition to by mouth Lasix twice a day today. Monitor strict I/O and daily weight. Continue aspirin, beta blocker and Imdur. Will need home o2 ( 2L)  on ambulation. (Patient finally agrees on going home with o2)     Non-ST elevation (NSTEMI) myocardial infarction (HCC) EF of 40-45% per echo from March this year.   Cardiac cath showing three-vessel coronary artery disease including diffuse LAD disease. Cardiology recommends medical therapy given improvement in her chest pain and shortness of breath with Diuretics and blood transfusion. Continue aspirin, statin, metoprolol and Imdur.    Active Problems:   Anemia of chronic kidney disease Receives Aranesp injections as outpatient. Noted for dropping hemoglobin to 7.5, improved with 1 unit PRBC.    DM type 2 ,  uncontrolled, causing CKD stage 3 (HCC) A1c of 11.3. CBG better after lantus dose increased.   Diabetic neuropathy with leg cramps Started on Flexeril.  Elevated TSH and free T4 Possibly due to acute illness. Normal free T3. Monitor as outpatient.  Right leg swelling. Patient was evaluated as outpatient with negative venous Doppler for DVT. Right lower extremity arterial Doppler showed multiple peripheral vascular lesions including significant stenosis of SFA and right distal  ATA. Needs outpatient referral to vascular surgery. (Cardiology aware).    Code Status : Full code  Family Communication  : discussed at length with daughter on the phone  Disposition Plan  : Possibly discharge home tomorrow if diuresing well on by mouth Lasix.  Barriers For Discharge : Active symptoms   Consults  :  Cardiology  Procedures  :  Left and right heart catheterization  DVT Prophylaxis  : sq lovenox  Lab Results  Component Value Date   PLT 479 (H) 10/12/2016    Antibiotics  :  None  Anti-infectives    None        Objective:   Vitals:   10/14/16 2200 10/15/16 0639 10/15/16 0758 10/15/16 0900  BP: 131/72   130/65  Pulse: (!) 114  91 89  Resp:   14 18  Temp:      TempSrc:      SpO2:   100% 100%  Weight:  62.4 kg (137 lb 9.6 oz)    Height:        Wt Readings from Last 3 Encounters:  10/15/16 62.4 kg (137 lb 9.6 oz)  08/27/16 57.2 kg (126 lb 3.2 oz)  08/05/16 62.8 kg (138 lb 8 oz)     Intake/Output Summary (Last 24 hours) at 10/15/16 1028 Last data filed at 10/15/16 0942  Gross per 24 hour  Intake              360 ml  Output             1150 ml  Net             -790 ml     Physical Exam  Gen.: Elderly female not in distress HEENT: Moist mucosa, supple neck Chest: Improved breath sounds bilaterally (improved crackles) CVS: Normal S1 and S2, no murmurs GI: Soft, nondistended, nontender Musculoskeletal: Warm, 1+ pitting edema on the left leg, 2+ pitting with  swelling on the right.          Data Review:    CBC  Recent Labs Lab 10/09/16 0428 10/10/16 0319 10/10/16 1554 10/11/16 0826 10/12/16 0632  WBC 7.3 7.3 9.5 7.9 8.6  HGB 8.2* 7.5* 9.9* 9.4* 9.5*  HCT 27.1* 25.1* 31.9* 31.1* 30.6*  PLT 538* 478* 499* 425* 479*  MCV 82.1 81.5 83.1 83.2 83.2  MCH 24.8* 24.4* 25.8* 25.1* 25.8*  MCHC 30.3 29.9* 31.0 30.2 31.0  RDW 19.9* 19.8* 19.3* 19.5* 19.7*    Chemistries   Recent Labs Lab 10/09/16 0428  10/11/16 0607 10/11/16 0826 10/12/16 0632 10/13/16 0301 10/14/16 0415 10/15/16 0407  NA 135  < > 135  --  133* 135 134* 136  K 4.7  < > 4.3  --  4.3 3.8 3.8 3.9  CL 107  < > 109  --  105 106 103 104  CO2 18*  < > 16*  --  17* 22 22 24   GLUCOSE 220*  < > 157*  --  147* 198* 111* 133*  BUN 39*  < > 33*  --  28* 32* 30* 34*  CREATININE 1.83*  < > 1.53*  --  1.37* 1.55* 1.35* 1.42*  CALCIUM 8.8*  < > 8.8*  --  8.9 8.7* 8.7* 8.5*  AST 13*  --  14*  --   --   --   --   --   ALT 8*  --  QUANTITY NOT SUFFICIENT, UNABLE TO PERFORM TEST 10*  --   --   --   --  ALKPHOS 91  --  83  --   --   --   --   --   BILITOT 0.2*  --  QUANTITY NOT SUFFICIENT, UNABLE TO PERFORM TEST 0.5  --   --   --   --   < > = values in this interval not displayed. ------------------------------------------------------------------------------------------------------------------ No results for input(s): CHOL, HDL, LDLCALC, TRIG, CHOLHDL, LDLDIRECT in the last 72 hours.  Lab Results  Component Value Date   HGBA1C 11.3 (H) 10/06/2016   ------------------------------------------------------------------------------------------------------------------ No results for input(s): TSH, T4TOTAL, T3FREE, THYROIDAB in the last 72 hours.  Invalid input(s): FREET3 ------------------------------------------------------------------------------------------------------------------ No results for input(s): VITAMINB12, FOLATE, FERRITIN, TIBC, IRON, RETICCTPCT in the last 72  hours.  Coagulation profile No results for input(s): INR, PROTIME in the last 168 hours.  No results for input(s): DDIMER in the last 72 hours.  Cardiac Enzymes No results for input(s): CKMB, TROPONINI, MYOGLOBIN in the last 168 hours.  Invalid input(s): CK ------------------------------------------------------------------------------------------------------------------    Component Value Date/Time   BNP 409.2 (H) 08/27/2016 1514    Inpatient Medications  Scheduled Meds: . amLODipine  5 mg Oral Daily  . aspirin  81 mg Oral Daily  . atorvastatin  40 mg Oral q1800  . cyclobenzaprine  5 mg Oral TID  . docusate sodium  100 mg Oral BID  . enoxaparin (LOVENOX) injection  40 mg Subcutaneous Q24H  . furosemide  40 mg Oral BID  . insulin aspart  0-15 Units Subcutaneous TID WC  . insulin aspart  0-5 Units Subcutaneous QHS  . insulin glargine  15 Units Subcutaneous QHS  . isosorbide mononitrate  60 mg Oral Daily  . mouth rinse  15 mL Mouth Rinse BID  . metoprolol tartrate  12.5 mg Oral BID  . pantoprazole  40 mg Oral Daily  . sodium chloride flush  3 mL Intravenous Q12H   Continuous Infusions: . sodium chloride     PRN Meds:.sodium chloride, acetaminophen, ALPRAZolam, artificial tears, fluticasone, nitroGLYCERIN, ondansetron (ZOFRAN) IV, polyethylene glycol, sodium chloride flush, traMADol  Micro Results No results found for this or any previous visit (from the past 240 hour(s)).  Radiology Reports Dg Chest 2 View  Result Date: 10/13/2016 CLINICAL DATA:  CHF, chest tightness EXAM: CHEST  2 VIEW COMPARISON:  10/06/2016 FINDINGS: Small bilateral pleural effusions. Bibasilar atelectasis or infiltrates. Mild cardiomegaly. No acute bony abnormality. IMPRESSION: Small bilateral pleural effusions with bibasilar atelectasis or infiltrate. Electronically Signed   By: Charlett Nose M.D.   On: 10/13/2016 14:18   Dg Chest Port 1 View  Result Date: 10/06/2016 CLINICAL DATA:  Shortness of  breath with left-sided chest pain. History of hypertension, diabetes and congestive heart failure. EXAM: PORTABLE CHEST 1 VIEW COMPARISON:  Radiographs 10/06/2016.  CT 08/01/2016. FINDINGS: 1655 hour. The heart size and mediastinal contours are stable. There are small bilateral pleural effusions with mild bibasilar atelectasis, vascular congestion and probable mild edema. No confluent airspace opacity or pneumothorax. No acute osseous findings are seen. Telemetry leads overlie the chest. IMPRESSION: Probable mild congestive heart failure with mild edema and small bilateral pleural effusions. Electronically Signed   By: Carey Bullocks M.D.   On: 10/06/2016 17:03    Time Spent in minutes 25   Eddie North M.D on 10/15/2016 at 10:28 AM  Between 7am to 7pm - Pager - [phone]  After 7pm go to [email or web address] - password [facility]  [facility] -  Office  [phone]

## 2016-10-16 LAB — BASIC METABOLIC PANEL
Anion gap: 9 (ref 5–15)
BUN: 39 mg/dL — ABNORMAL HIGH (ref 6–20)
CO2: 25 mmol/L (ref 22–32)
Calcium: 8.4 mg/dL — ABNORMAL LOW (ref 8.9–10.3)
Chloride: 103 mmol/L (ref 101–111)
Creatinine, Ser: 1.46 mg/dL — ABNORMAL HIGH (ref 0.44–1.00)
GFR calc Af Amer: 40 mL/min — ABNORMAL LOW (ref >60)
GFR calc non Af Amer: 34 mL/min — ABNORMAL LOW (ref >60)
Glucose, Bld: 118 mg/dL — ABNORMAL HIGH (ref 65–99)
Potassium: 3.9 mmol/L (ref 3.5–5.1)
Sodium: 137 mmol/L (ref 135–145)

## 2016-10-16 LAB — GLUCOSE, CAPILLARY
Glucose-Capillary: 100 mg/dL — ABNORMAL HIGH (ref 65–99)
Glucose-Capillary: 97 mg/dL (ref 65–99)

## 2016-10-16 MED ORDER — FUROSEMIDE 40 MG PO TABS: 40.0000 mg | ORAL_TABLET | Freq: Two times a day (BID) | ORAL | 0 refills | Status: DC

## 2016-10-16 MED ORDER — ISOSORBIDE MONONITRATE ER 60 MG PO TB24: 60.0000 mg | ORAL_TABLET | Freq: Every day | ORAL | 0 refills | Status: DC

## 2016-10-16 MED ORDER — CYCLOBENZAPRINE HCL 5 MG PO TABS: 5.0000 mg | ORAL_TABLET | Freq: Three times a day (TID) | ORAL | 0 refills | Status: DC

## 2016-10-16 MED ORDER — CARVEDILOL 6.25 MG PO TABS: 6.2500 mg | ORAL_TABLET | Freq: Two times a day (BID) | ORAL | 0 refills | Status: DC

## 2016-10-16 NOTE — Progress Notes (Signed)
The patient has been seen in conjunction with Laverda Page, NP. All aspects of care have been considered and discussed. The patient has been personally interviewed, examined, and all clinical data has been reviewed.   Stable weight and kidney function on current therapy.  Needs labs and TOC in 7-10 days.  Agree with findings/documentation in this note.  Progress Note  Patient Name: Emily Miranda Date of Encounter: 10/16/2016  Primary Cardiologist: Hochrein  Subjective   No complaints, anxious to go home today.   Inpatient Medications    Scheduled Meds: . amLODipine  5 mg Oral Daily  . aspirin  81 mg Oral Daily  . atorvastatin  40 mg Oral q1800  . carvedilol  6.25 mg Oral BID WC  . cyclobenzaprine  5 mg Oral TID  . docusate sodium  100 mg Oral BID  . enoxaparin (LOVENOX) injection  40 mg Subcutaneous Q24H  . furosemide  40 mg Oral BID  . insulin aspart  0-15 Units Subcutaneous TID WC  . insulin aspart  0-5 Units Subcutaneous QHS  . insulin glargine  15 Units Subcutaneous QHS  . isosorbide mononitrate  60 mg Oral Daily  . mouth rinse  15 mL Mouth Rinse BID  . pantoprazole  40 mg Oral Daily  . sodium chloride flush  3 mL Intravenous Q12H   Continuous Infusions: . sodium chloride     PRN Meds: sodium chloride, acetaminophen, ALPRAZolam, artificial tears, fluticasone, HYDROcodone-acetaminophen, nitroGLYCERIN, ondansetron (ZOFRAN) IV, polyethylene glycol, sodium chloride flush, traMADol   Vital Signs    Vitals:   10/15/16 1600 10/15/16 1800 10/15/16 2026 10/16/16 0522  BP:   (!) 108/57 132/67  Pulse: 82 87 82 81  Resp: 15 16 18 18   Temp:   98.3 F (36.8 C) 97.7 F (36.5 C)  TempSrc:   Oral Oral  SpO2: 98% 98% 97% 98%  Weight:    135 lb 14.4 oz (61.6 kg)  Height:        Intake/Output Summary (Last 24 hours) at 10/16/16 0813 Last data filed at 10/16/16 0500  Gross per 24 hour  Intake              960 ml  Output              950 ml  Net                10 ml   Filed Weights   10/14/16 0622 10/15/16 0639 10/16/16 0522  Weight: 138 lb 11.2 oz (62.9 kg) 137 lb 9.6 oz (62.4 kg) 135 lb 14.4 oz (61.6 kg)    Telemetry    SR with PVCs - Personally Reviewed  ECG    N/A - Personally Reviewed  Physical Exam   General: Well developed, well nourished, female appearing in no acute distress. Head: Normocephalic, atraumatic.  Neck: Supple without bruits, JVD. Lungs:  Resp regular and unlabored, mildly diminished in lower lobes. Heart: RRR, S1, S2, no S3, S4, 2/6 systolic murmur; no rub. Abdomen: Soft, non-tender, non-distended with normoactive bowel sounds. No hepatomegaly. No rebound/guarding. No obvious abdominal masses. Extremities: No clubbing, cyanosis, R LE edema, no longer L LE edema noted. Distal pedal pulses are 2+ bilaterally. Neuro: Alert and oriented X 3. Moves all extremities spontaneously. Psych: Normal affect.  Labs    Chemistry Recent Labs Lab 10/11/16 [id number] 10/11/16 0826  10/14/16 0415 10/15/16 0407 10/16/16 0317  NA 135  --   < > 134* 136 137  K 4.3  --   < >  3.8 3.9 3.9  CL 109  --   < > 103 104 103  CO2 16*  --   < > 22 24 25   GLUCOSE 157*  --   < > 111* 133* 118*  BUN 33*  --   < > 30* 34* 39*  CREATININE 1.53*  --   < > 1.35* 1.42* 1.46*  CALCIUM 8.8*  --   < > 8.7* 8.5* 8.4*  PROT 6.5  --   --   --   --   --   ALBUMIN 1.9*  --   --   --   --   --   AST 14*  --   --   --   --   --   ALT QUANTITY NOT SUFFICIENT, UNABLE TO PERFORM TEST 10*  --   --   --   --   ALKPHOS 83  --   --   --   --   --   BILITOT QUANTITY NOT SUFFICIENT, UNABLE TO PERFORM TEST 0.5  --   --   --   --   GFRNONAA 32*  --   < > 38* 35* 34*  GFRAA 38*  --   < > 44* 41* 40*  ANIONGAP 10  --   < > 9 8 9   < > = values in this interval not displayed.   Hematology Recent Labs Lab 10/10/16 1554 10/11/16 0826 10/12/16 0632  WBC 9.5 7.9 8.6  RBC 3.84* 3.74* 3.68*  HGB 9.9* 9.4* 9.5*  HCT 31.9* 31.1* 30.6*  MCV 83.1 83.2 83.2  MCH  25.8* 25.1* 25.8*  MCHC 31.0 30.2 31.0  RDW 19.3* 19.5* 19.7*  PLT 499* 425* 479*    Cardiac EnzymesNo results for input(s): TROPONINI in the last 168 hours. No results for input(s): TROPIPOC in the last 168 hours.   BNPNo results for input(s): BNP, PROBNP in the last 168 hours.   DDimer No results for input(s): DDIMER in the last 168 hours.    Radiology    No results found.  Cardiac Studies   Cath (10/11/16):  Conclusions: 1. Three vessel coronary artery disease, including diffuse LAD disease with sequential 50% lesions, sequential 70% and 60% mid LCx stenosis, 50% mid RCA lesion, and 90% stenosis in midportion of small rPDA. 2. Mildly elevated left ventricular filling pressure. 3. Moderately elevated aortic valve gradient (peak-to-peak gradient 25 mmHg).  Recommendations: 1. Medical therapy given improvement of chest pain and shortness with blood transfusion and diuresis. 2. If symptoms worsen despite stable hemoglobin and optimized volume status, FFR/PCI to the LCx could be performed. I would defer PCI to PDA given its small size.   Coronary Diagrams   Diagnostic Diagram         Patient Profile     76 y.o. female with history of mild to moderate aortic stenosis, hypertension, diabetes and mixed systolic and diastolic heart failure. She also has moderate renal insufficiency. She was admitted with unstable angina. Her enzymes are low and flat. Underwent cath with 3v disease, plan for medical therapy. Diuresing well.   Assessment & Plan    1. NSTEMI: Symptomatically stable. Cath results noted above. Medical management recommended.  No recurrent chest pain with rest or exertion.  --Continue ASA, Lipitor, Imdurand coreg.  2. Acute on chronic combined systolic and diastolic heart failure, LVEF 40-45%: Has diuresed a total of 8L this admission with IV Lasix. Breathing continues to improve. Weight trending down, now 135lb. Overall volume status  seems stable.  Has  tolerated transition to oral lasix dosing.  -- continue lasix 40mg  PO BID -- talked with her about the need for following daily weights, and diet.   3. Hypertension: Controlled  4. ZHY:QMVHQION Lipitor.  5. Aortic stenosis:Mild by echo in March 2018.  6. Anemia:Received PRBC transfusion. Aranesp as outpatient.  7. GEX:BMWUX lower extremity doppler showed lesions in the SFA and distal ATA. Needs outpatient follow up, possibly with Dr. Allyson Sabal or VS. Continue ASA and statin.  8. CKD stage 3:Improved at 1.46.   -- will arrange f/u in the office.   Signed, Laverda Page, NP  10/16/2016, 8:13 AM

## 2016-10-16 NOTE — Discharge Summary (Signed)
Physician Discharge Summary  Emily Miranda ZOX:[id number] DOB: 04-26-1941 DOA: 10/06/2016  PCP: Noni Saupe, MD  Admit date: 10/06/2016 Discharge date: 10/16/2016  Time spent: 35 minutes  Recommendations for Outpatient Follow-up:  1. New meds this admit:-Lasix 40 od-->bid, Imdur 60 od, Coreg 6.25 2. Needs BMET 1 week at Dr. Antoine Poche office 3. Get TSH 1 MO 4. Get Anemia panel and Ferritin 3-4 weeks and ? IV iron as OP 5. Need Daily weight c HH RN and PT/OT 6. Needs OP re-eval for mobility recommendations per Podiatry Dr. Marylene Land --currently ambulates minimally and use a wc  Discharge Diagnoses:  Principal Problem:   Non-ST elevation (NSTEMI) myocardial infarction [facility]) Active Problems:   DM type 2 causing CKD stage 3 (HCC)   Mild aortic stenosis   CHEST PAIN   HTN (hypertension)   Shortness of breath   Acute combined systolic and diastolic congestive heart failure (HCC)   Chronic renal insufficiency, stage 3 (moderate)   Chronic diabetic ulcer of right foot    NSTEMI (non-ST elevated myocardial infarction) (HCC)   Mixed hyperlipidemia   PVD (peripheral vascular disease) (HCC)   Coronary artery disease   Discharge Condition: improved  Diet recommendation: heart healthy low salt  Filed Weights   10/14/16 0622 10/15/16 0639 10/16/16 0522  Weight: 62.9 kg (138 lb 11.2 oz) 62.4 kg (137 lb 9.6 oz) 61.6 kg (135 lb 14.4 oz)    History of present illness:    76 year old female with history of diastolic CHF, moderate aortic stenosis, hypertension, uncontrolled diabetes mellitus recently started on insulin, neuropathy, chronic kidney disease stage III was transferred to [facility] ED for NSTEMI and acute CHF.  Acute on chronic systolic CHF Echo with EF of 40-45%. Diabetes well on IV Lasix but still weight over 10 pounds from baseline (120-125 LBS). D/c weight 135\ Transition to by mouth Lasix twice a day today.  Need OP weight checks on  d/c Conisder Rpt Echo as OP for worseninsg AoS stenosis per Cardiology--last echo was 07/2016 Continue aspirin, beta blocker and Imdur [started this admit as above]     Non-ST elevation (NSTEMI) myocardial infarction (HCC) EF of 40-45% per echo from March this year.  Cardiac cath showing three-vessel coronary artery disease including diffuse LAD disease.  Cardiology recommends medical therapy given improvement in her chest pain and shortness of breath with Diuretics and blood transfusion. Continue aspirin, statin, metoprolol and Imdur.   Anemia of chronic kidney disease Receives Aranesp injections as outpatient. Noted for dropping hemoglobin to 7.5, improved with 1 unit PRBC. Needs OP Anemia panel 3 weeks and ferritin    DM type 2 , uncontrolled, causing CKD stage 3 (HCC) A1c of 11.3. CBG better after lantus dose increased.  Diabetic neuropathy with leg cramps Started on Flexeril Needs OP follow up Dr. Marylene Land for Imporved RLE foot wound She is still not ambulatory and may benefit from an offloading boot  Elevated TSH and free T4 Possibly due to acute illness. Normal free T3. Monitor as outpatient.  Right leg swelling. Patient was evaluated as outpatient with negative venous Doppler for DVT. Right lower extremity arterial Doppler showed multiple peripheral vascular lesions including significant stenosis of SFA and right distal ATA. Needs outpatient referral to vascular surgery. (Cardiology aware).  Discharge Exam: Vitals:   10/15/16 2026 10/16/16 0522  BP: (!) 108/57 132/67  Pulse: 82 81  Resp: 18 18  Temp: 98.3 F (36.8 C) 97.7 F (36.5 C)    General:  eomi ncat Cardiovascular: s1 s2 loud s1 with radiation Respiratory: clear no added sound LE not swollen RLE has healing wound medial aspect   Discharge Instructions  Conclusion   Conclusions: 1. Three vessel coronary artery disease, including diffuse LAD disease with sequential 50% lesions, sequential 70% and  60% mid LCx stenosis, 50% mid RCA lesion, and 90% stenosis in midportion of small rPDA. 2. Mildly elevated left ventricular filling pressure. 3. Moderately elevated aortic valve gradient (peak-to-peak gradient 25 mmHg).  Recommendations: 1. Medical therapy given improvement of chest pain and shortness with blood transfusion and diuresis. 2. If symptoms worsen despite stable hemoglobin and optimized volume status, FFR/PCI to the LCx could be performed. I would defer PCI to PDA given its small size.    Discharge Instructions    AMB Referral to [facility] Care Management    Complete by:  As directed    Please assign to [facility] RNCM for transition of care and symptom and disease management and education for CHF, DM, HTN, Currently at [facility]. Likely dc soon. Written consent was obtained. Thanks. Raiford Noble, MSN-Ed, [facility] [phone]   Reason for consult:  Please assign to [facility] RNCM   Diagnoses of:   Heart Failure Diabetes     Expected date of contact:  1-3 days (reserved for hospital discharges)     Current Discharge Medication List    CONTINUE these medications which have NOT CHANGED   Details  amLODipine (NORVASC) 5 MG tablet Take 5 mg by mouth daily.     Ascorbic Acid (VITAMIN C) 1000 MG tablet Take 1,000 mg by mouth daily.    aspirin EC 81 MG EC tablet Take 1 tablet (81 mg total) by mouth daily. Qty: 30 tablet, Refills: 0    atorvastatin (LIPITOR) 40 MG tablet Take 1 tablet (40 mg total) by mouth daily at 6 PM. Qty: 30 tablet, Refills: 3    docusate sodium (COLACE) 100 MG capsule Take 1 capsule (100 mg total) by mouth 2 (two) times daily. Qty: 10 capsule, Refills: 1    furosemide (LASIX) 40 MG tablet Take 1 tablet (40 mg total) by mouth daily. Qty: 30 tablet, Refills: 5    glimepiride (AMARYL) 4 MG tablet Take 8 mg by mouth daily with breakfast.     insulin glargine (LANTUS) 100 UNIT/ML injection Inject 0.12 mLs (12 Units  total) into the skin at bedtime. Qty: 10 mL, Refills: 11    omeprazole (PRILOSEC) 20 MG capsule Take 20 mg by mouth daily.     Polyethyl Glycol-Propyl Glycol (SYSTANE OP) Place 1 drop into both eyes daily as needed (dry eyes).    polyethylene glycol (MIRALAX / GLYCOLAX) packet Take 17 g by mouth daily as needed (constipation). Mix in 8 oz liquid and drink    TRADJENTA 5 MG TABS tablet Take 5 mg by mouth daily. Refills: 0    traMADol (ULTRAM) 50 MG tablet Take 1 tablet (50 mg total) by mouth every 8 (eight) hours as needed. Qty: 30 tablet, Refills: 0    fluticasone (FLONASE) 50 MCG/ACT nasal spray Place 1 spray into both nostrils daily as needed for allergies or rhinitis (seasonal allergies).        Allergies  Allergen Reactions  . Levofloxacin Nausea And Vomiting    Other reaction(s): Confusion  . Gabapentin Other (See Comments)    Disoriented, no strength in legs   Follow-up Information    Abelino Derrick, PA-C Follow up on 10/30/2016.   Specialties:  Cardiology, Radiology Why:  at 9:30am for your follow up appt.  Contact information: [address] [place] [address] [phone]            The results of significant diagnostics from this hospitalization (including imaging, microbiology, ancillary and laboratory) are listed below for reference.    Significant Diagnostic Studies: Dg Chest 2 View  Result Date: 10/13/2016 CLINICAL DATA:  CHF, chest tightness EXAM: CHEST  2 VIEW COMPARISON:  10/06/2016 FINDINGS: Small bilateral pleural effusions. Bibasilar atelectasis or infiltrates. Mild cardiomegaly. No acute bony abnormality. IMPRESSION: Small bilateral pleural effusions with bibasilar atelectasis or infiltrate. Electronically Signed   By: Charlett Nose M.D.   On: 10/13/2016 14:18   Dg Chest Port 1 View  Result Date: 10/06/2016 CLINICAL DATA:  Shortness of breath with left-sided chest pain. History of hypertension, diabetes and congestive heart failure.  EXAM: PORTABLE CHEST 1 VIEW COMPARISON:  Radiographs 10/06/2016.  CT 08/01/2016. FINDINGS: 1655 hour. The heart size and mediastinal contours are stable. There are small bilateral pleural effusions with mild bibasilar atelectasis, vascular congestion and probable mild edema. No confluent airspace opacity or pneumothorax. No acute osseous findings are seen. Telemetry leads overlie the chest. IMPRESSION: Probable mild congestive heart failure with mild edema and small bilateral pleural effusions. Electronically Signed   By: Carey Bullocks M.D.   On: 10/06/2016 17:03    Microbiology: No results found for this or any previous visit (from the past 240 hour(s)).   Labs: Basic Metabolic Panel:  Recent Labs Lab 10/12/16 0632 10/13/16 0301 10/14/16 0415 10/15/16 0407 10/16/16 0317  NA 133* 135 134* 136 137  K 4.3 3.8 3.8 3.9 3.9  CL 105 106 103 104 103  CO2 17* 22 22 24 25   GLUCOSE 147* 198* 111* 133* 118*  BUN 28* 32* 30* 34* 39*  CREATININE 1.37* 1.55* 1.35* 1.42* 1.46*  CALCIUM 8.9 8.7* 8.7* 8.5* 8.4*   Liver Function Tests:  Recent Labs Lab 10/11/16 0607 10/11/16 0826  AST 14*  --   ALT QUANTITY NOT SUFFICIENT, UNABLE TO PERFORM TEST 10*  ALKPHOS 83  --   BILITOT QUANTITY NOT SUFFICIENT, UNABLE TO PERFORM TEST 0.5  PROT 6.5  --   ALBUMIN 1.9*  --    No results for input(s): LIPASE, AMYLASE in the last 168 hours. No results for input(s): AMMONIA in the last 168 hours. CBC:  Recent Labs Lab 10/10/16 0319 10/10/16 1554 10/11/16 0826 10/12/16 0632  WBC 7.3 9.5 7.9 8.6  HGB 7.5* 9.9* 9.4* 9.5*  HCT 25.1* 31.9* 31.1* 30.6*  MCV 81.5 83.1 83.2 83.2  PLT 478* 499* 425* 479*   Cardiac Enzymes: No results for input(s): CKTOTAL, CKMB, CKMBINDEX, TROPONINI in the last 168 hours. BNP: BNP (last 3 results)  Recent Labs  07/29/16 1110 08/27/16 1514  BNP 2,605.1* 409.2*    ProBNP (last 3 results) No results for input(s): PROBNP in the last 8760 hours.  CBG:  Recent  Labs Lab 10/15/16 1113 10/15/16 1625 10/15/16 2113 10/16/16 0636 10/16/16 0738  GLUCAP 269* 172* 131* 100* 97       Signed:  Rhetta Mura MD   [facility] 10/16/2016, 9:10 AM

## 2016-10-16 NOTE — Progress Notes (Signed)
Updated report received in patient's room via Melody RN using SBAR format, reviewed VS, new orders and events of the day, assumed care of patient.

## 2016-10-18 ENCOUNTER — Other Ambulatory Visit: Payer: Self-pay

## 2016-10-18 ENCOUNTER — Telehealth: Payer: Self-pay | Admitting: Cardiology

## 2016-10-18 DIAGNOSIS — I130 Hypertensive heart and chronic kidney disease with heart failure and stage 1 through stage 4 chronic kidney disease, or unspecified chronic kidney disease: Secondary | ICD-10-CM | POA: Diagnosis not present

## 2016-10-18 DIAGNOSIS — I350 Nonrheumatic aortic (valve) stenosis: Secondary | ICD-10-CM | POA: Diagnosis not present

## 2016-10-18 DIAGNOSIS — E11610 Type 2 diabetes mellitus with diabetic neuropathic arthropathy: Secondary | ICD-10-CM | POA: Diagnosis not present

## 2016-10-18 DIAGNOSIS — Z8631 Personal history of diabetic foot ulcer: Secondary | ICD-10-CM | POA: Diagnosis not present

## 2016-10-18 DIAGNOSIS — E1142 Type 2 diabetes mellitus with diabetic polyneuropathy: Secondary | ICD-10-CM | POA: Diagnosis not present

## 2016-10-18 DIAGNOSIS — I5041 Acute combined systolic (congestive) and diastolic (congestive) heart failure: Secondary | ICD-10-CM | POA: Diagnosis not present

## 2016-10-18 DIAGNOSIS — Z7982 Long term (current) use of aspirin: Secondary | ICD-10-CM | POA: Diagnosis not present

## 2016-10-18 DIAGNOSIS — E1122 Type 2 diabetes mellitus with diabetic chronic kidney disease: Secondary | ICD-10-CM | POA: Diagnosis not present

## 2016-10-18 DIAGNOSIS — E1151 Type 2 diabetes mellitus with diabetic peripheral angiopathy without gangrene: Secondary | ICD-10-CM | POA: Diagnosis not present

## 2016-10-18 DIAGNOSIS — I2510 Atherosclerotic heart disease of native coronary artery without angina pectoris: Secondary | ICD-10-CM | POA: Diagnosis not present

## 2016-10-18 DIAGNOSIS — E1165 Type 2 diabetes mellitus with hyperglycemia: Secondary | ICD-10-CM | POA: Diagnosis not present

## 2016-10-18 DIAGNOSIS — D631 Anemia in chronic kidney disease: Secondary | ICD-10-CM | POA: Diagnosis not present

## 2016-10-18 DIAGNOSIS — Z7951 Long term (current) use of inhaled steroids: Secondary | ICD-10-CM | POA: Diagnosis not present

## 2016-10-18 DIAGNOSIS — Z794 Long term (current) use of insulin: Secondary | ICD-10-CM | POA: Diagnosis not present

## 2016-10-18 DIAGNOSIS — I214 Non-ST elevation (NSTEMI) myocardial infarction: Secondary | ICD-10-CM | POA: Diagnosis not present

## 2016-10-18 DIAGNOSIS — N183 Chronic kidney disease, stage 3 (moderate): Secondary | ICD-10-CM | POA: Diagnosis not present

## 2016-10-18 NOTE — Telephone Encounter (Signed)
Left detailed message on Emily Miranda hospital home health VM

## 2016-10-18 NOTE — Telephone Encounter (Signed)
I do not see on medication list or on last visit office note.

## 2016-10-18 NOTE — Patient Outreach (Signed)
Transition of care: Discharge date of 10/16/2016  Attempted call x 3. Unsuccessful First attempt fast busy.  Cell phone attempt.  States that phone is disconnected. 3rd attempt back to home number which just rang and rang. No machine.  PLAN: Will continue transition of care outreach attempts.  Rowe Pavy, RN, BSN, CEN [facility] [employer] [phone]

## 2016-10-18 NOTE — Telephone Encounter (Signed)
[facility] is [facility] in Reference to Emily Miranda t , just got of hospital on Wednesday and said that they gave her Lisinopril 30mg  once a day . It is not on her discharge Medication list and she is wanting to make sure that its ok for her to take this . Please call .   Thanks

## 2016-10-21 ENCOUNTER — Other Ambulatory Visit: Payer: Self-pay | Admitting: *Deleted

## 2016-10-21 NOTE — Patient Outreach (Addendum)
[facility]) Care Management  10/21/2016  JAQUELINA ACORN January 25, 1941 [id number]   Discharge Date 6/ Diagnosis Chest pain, MI , medical management ,Acute on chronic heart failure.  Transition of care call  Placed call and spoke with patient reports she is doing fairly well managing at home and her husband is available to help her. [facility] home [facility] has made initial assessment home visit and HHRN to visit on 6/13.    MI Patient denies chest pain or discomfort, shortness of breath, patient declined need for home oxygen at discharge. Reviewed medication list , patient has all medications, denies cost concerns related to purchasing medications.Patient states she is able to manage her own medications states she does not use a pill organizer she takes medications from bottle each day.  Patient was recently discharged from hospital and all medications have been reviewed.  Has cardiology follow up visit on 6/20 husband can provide transporatation.    Heart Failure  Patient has scales for weighing, today's weight 133.5 and yesterday weight 135. Denies having increased swelling other than chronic swelling at right foot ulcer area . Discussed limiting salt in diet   Diabetes  Patient reports checking her blood sugar daily, today's reading 166 and taking Lantus insulin 40 units at bedtime as prescribed by her PCP. Encouraged to continue to monitor blood sugar, patient denies having symptoms of low blood sugar of being shaky, sweaty,dizziness.   Chronic wound to right foot Patient discussed frequent [facility] related to wound at right foot, reports wound healing no dressing needed, she keeps foot covered with a sock and she is awaiting shoe fitting as swelling decreases. Patient uses a walker at home and has a wheelchair as needed for transport.   Patient has scheduled post hospital visit with cardiology and will call to schedule PCP post hospital visit.     Plan Explained to patient Rowe Pavy is her assigned case manager and will follow up with her in the next week as part of the transition of care program. Made sure patient has [facility] care management contact information.  Patient will notify MD of weight increases , shortness of breath, chest pain and 911 for emergency    [facility] CM Care Plan Problem One     Most Recent Value  Care Plan Problem One  Hospital admission related to MI and heart failure   Role Documenting the Problem One  Care Management Coordinator  Care Plan for Problem One  Active  [facility] Long Term Goal   Patient will not experience a hospital readmission in the next 60 days   [facility] Long Term Goal Start Date  10/21/16  Interventions for Problem One Long Term Goal  Advised regarding taking medications as prescribed, reviewed discharge instructions, advised to notify MD of new symptoms concerns sooner   [facility] CM Short Term Goal #1   Patient will attend all medical appointments in the next 30 days   [facility] CM Short Term Goal #1 Start Date  10/21/16  Interventions for Short Term Goal #1  Advised patient regarding importance of scheduling  PCP post hospital visit and keeping all specialist visits,    [facility] CM Short Term Goal #2   Patient will continue to weigh daily and keep a record in the next 30 days   [facility] CM Short Term Goal #2 Start Date  10/21/16  Interventions for Short Term Goal #2  Advised patient regarding the importance of daily weights and keeping a record,  advised regarding when to notify MD sudden weight gains of 3 pounds in a day and 5 in week , shortness of breath or increased swelling.       Egbert Garibaldi, RN, [facility] Care Management [phone]- Mobile [phone]- Toll Free Main Office

## 2016-10-22 ENCOUNTER — Encounter: Payer: Self-pay | Admitting: *Deleted

## 2016-10-22 DIAGNOSIS — I2510 Atherosclerotic heart disease of native coronary artery without angina pectoris: Secondary | ICD-10-CM | POA: Diagnosis not present

## 2016-10-22 DIAGNOSIS — I350 Nonrheumatic aortic (valve) stenosis: Secondary | ICD-10-CM | POA: Diagnosis not present

## 2016-10-22 DIAGNOSIS — Z7982 Long term (current) use of aspirin: Secondary | ICD-10-CM | POA: Diagnosis not present

## 2016-10-22 DIAGNOSIS — D631 Anemia in chronic kidney disease: Secondary | ICD-10-CM | POA: Diagnosis not present

## 2016-10-22 DIAGNOSIS — E1122 Type 2 diabetes mellitus with diabetic chronic kidney disease: Secondary | ICD-10-CM | POA: Diagnosis not present

## 2016-10-22 DIAGNOSIS — Z7951 Long term (current) use of inhaled steroids: Secondary | ICD-10-CM | POA: Diagnosis not present

## 2016-10-22 DIAGNOSIS — E1142 Type 2 diabetes mellitus with diabetic polyneuropathy: Secondary | ICD-10-CM | POA: Diagnosis not present

## 2016-10-22 DIAGNOSIS — N183 Chronic kidney disease, stage 3 (moderate): Secondary | ICD-10-CM | POA: Diagnosis not present

## 2016-10-22 DIAGNOSIS — I130 Hypertensive heart and chronic kidney disease with heart failure and stage 1 through stage 4 chronic kidney disease, or unspecified chronic kidney disease: Secondary | ICD-10-CM | POA: Diagnosis not present

## 2016-10-22 DIAGNOSIS — I214 Non-ST elevation (NSTEMI) myocardial infarction: Secondary | ICD-10-CM | POA: Diagnosis not present

## 2016-10-22 DIAGNOSIS — E1151 Type 2 diabetes mellitus with diabetic peripheral angiopathy without gangrene: Secondary | ICD-10-CM | POA: Diagnosis not present

## 2016-10-22 DIAGNOSIS — E11610 Type 2 diabetes mellitus with diabetic neuropathic arthropathy: Secondary | ICD-10-CM | POA: Diagnosis not present

## 2016-10-22 DIAGNOSIS — I5041 Acute combined systolic (congestive) and diastolic (congestive) heart failure: Secondary | ICD-10-CM | POA: Diagnosis not present

## 2016-10-22 DIAGNOSIS — Z8631 Personal history of diabetic foot ulcer: Secondary | ICD-10-CM | POA: Diagnosis not present

## 2016-10-22 DIAGNOSIS — Z794 Long term (current) use of insulin: Secondary | ICD-10-CM | POA: Diagnosis not present

## 2016-10-22 DIAGNOSIS — E1165 Type 2 diabetes mellitus with hyperglycemia: Secondary | ICD-10-CM | POA: Diagnosis not present

## 2016-10-23 ENCOUNTER — Other Ambulatory Visit: Payer: Medicare Other

## 2016-10-24 DIAGNOSIS — I2510 Atherosclerotic heart disease of native coronary artery without angina pectoris: Secondary | ICD-10-CM | POA: Diagnosis not present

## 2016-10-24 DIAGNOSIS — E1151 Type 2 diabetes mellitus with diabetic peripheral angiopathy without gangrene: Secondary | ICD-10-CM | POA: Diagnosis not present

## 2016-10-24 DIAGNOSIS — E1122 Type 2 diabetes mellitus with diabetic chronic kidney disease: Secondary | ICD-10-CM | POA: Diagnosis not present

## 2016-10-24 DIAGNOSIS — E1142 Type 2 diabetes mellitus with diabetic polyneuropathy: Secondary | ICD-10-CM | POA: Diagnosis not present

## 2016-10-24 DIAGNOSIS — I214 Non-ST elevation (NSTEMI) myocardial infarction: Secondary | ICD-10-CM | POA: Diagnosis not present

## 2016-10-24 DIAGNOSIS — I130 Hypertensive heart and chronic kidney disease with heart failure and stage 1 through stage 4 chronic kidney disease, or unspecified chronic kidney disease: Secondary | ICD-10-CM | POA: Diagnosis not present

## 2016-10-24 DIAGNOSIS — I5041 Acute combined systolic (congestive) and diastolic (congestive) heart failure: Secondary | ICD-10-CM | POA: Diagnosis not present

## 2016-10-24 DIAGNOSIS — Z794 Long term (current) use of insulin: Secondary | ICD-10-CM | POA: Diagnosis not present

## 2016-10-24 DIAGNOSIS — E1165 Type 2 diabetes mellitus with hyperglycemia: Secondary | ICD-10-CM | POA: Diagnosis not present

## 2016-10-24 DIAGNOSIS — D631 Anemia in chronic kidney disease: Secondary | ICD-10-CM | POA: Diagnosis not present

## 2016-10-24 DIAGNOSIS — I350 Nonrheumatic aortic (valve) stenosis: Secondary | ICD-10-CM | POA: Diagnosis not present

## 2016-10-24 DIAGNOSIS — E11610 Type 2 diabetes mellitus with diabetic neuropathic arthropathy: Secondary | ICD-10-CM | POA: Diagnosis not present

## 2016-10-24 DIAGNOSIS — Z8631 Personal history of diabetic foot ulcer: Secondary | ICD-10-CM | POA: Diagnosis not present

## 2016-10-24 DIAGNOSIS — Z7951 Long term (current) use of inhaled steroids: Secondary | ICD-10-CM | POA: Diagnosis not present

## 2016-10-24 DIAGNOSIS — N183 Chronic kidney disease, stage 3 (moderate): Secondary | ICD-10-CM | POA: Diagnosis not present

## 2016-10-24 DIAGNOSIS — Z7982 Long term (current) use of aspirin: Secondary | ICD-10-CM | POA: Diagnosis not present

## 2016-10-26 DIAGNOSIS — M79671 Pain in right foot: Secondary | ICD-10-CM | POA: Diagnosis not present

## 2016-10-26 DIAGNOSIS — E11621 Type 2 diabetes mellitus with foot ulcer: Secondary | ICD-10-CM | POA: Diagnosis not present

## 2016-10-26 DIAGNOSIS — L97412 Non-pressure chronic ulcer of right heel and midfoot with fat layer exposed: Secondary | ICD-10-CM | POA: Diagnosis not present

## 2016-10-26 DIAGNOSIS — M14671 Charcot's joint, right ankle and foot: Secondary | ICD-10-CM | POA: Diagnosis not present

## 2016-10-28 ENCOUNTER — Other Ambulatory Visit: Payer: Self-pay

## 2016-10-28 NOTE — Patient Outreach (Signed)
Transition of care: Placed call to patient for transition of care.  Patient reports that she felt weak yesterday. States she feels better today.  Reports today's weight of 138, Yesterdays weight of 136.  States slight shortness of breath. (patient feels this is related to air quality)  Patient reports that she is being tele monitored by home health. Offered home visit and patient has declined. States " there are enough people coming to my house".   PLAN: reviewed with patient to follow low salt diet and weigh daily.   Will continue weekly transition of care calls.  Rowe Pavy, RN, BSN, CEN [facility] [employer] [phone]

## 2016-10-29 DIAGNOSIS — Z7982 Long term (current) use of aspirin: Secondary | ICD-10-CM | POA: Diagnosis not present

## 2016-10-29 DIAGNOSIS — I2510 Atherosclerotic heart disease of native coronary artery without angina pectoris: Secondary | ICD-10-CM | POA: Diagnosis not present

## 2016-10-29 DIAGNOSIS — E1142 Type 2 diabetes mellitus with diabetic polyneuropathy: Secondary | ICD-10-CM | POA: Diagnosis not present

## 2016-10-29 DIAGNOSIS — Z7951 Long term (current) use of inhaled steroids: Secondary | ICD-10-CM | POA: Diagnosis not present

## 2016-10-29 DIAGNOSIS — I5041 Acute combined systolic (congestive) and diastolic (congestive) heart failure: Secondary | ICD-10-CM | POA: Diagnosis not present

## 2016-10-29 DIAGNOSIS — Z794 Long term (current) use of insulin: Secondary | ICD-10-CM | POA: Diagnosis not present

## 2016-10-29 DIAGNOSIS — Z8631 Personal history of diabetic foot ulcer: Secondary | ICD-10-CM | POA: Diagnosis not present

## 2016-10-29 DIAGNOSIS — E1165 Type 2 diabetes mellitus with hyperglycemia: Secondary | ICD-10-CM | POA: Diagnosis not present

## 2016-10-29 DIAGNOSIS — I130 Hypertensive heart and chronic kidney disease with heart failure and stage 1 through stage 4 chronic kidney disease, or unspecified chronic kidney disease: Secondary | ICD-10-CM | POA: Diagnosis not present

## 2016-10-29 DIAGNOSIS — E11610 Type 2 diabetes mellitus with diabetic neuropathic arthropathy: Secondary | ICD-10-CM | POA: Diagnosis not present

## 2016-10-29 DIAGNOSIS — E1151 Type 2 diabetes mellitus with diabetic peripheral angiopathy without gangrene: Secondary | ICD-10-CM | POA: Diagnosis not present

## 2016-10-29 DIAGNOSIS — I214 Non-ST elevation (NSTEMI) myocardial infarction: Secondary | ICD-10-CM | POA: Diagnosis not present

## 2016-10-29 DIAGNOSIS — N183 Chronic kidney disease, stage 3 (moderate): Secondary | ICD-10-CM | POA: Diagnosis not present

## 2016-10-29 DIAGNOSIS — D631 Anemia in chronic kidney disease: Secondary | ICD-10-CM | POA: Diagnosis not present

## 2016-10-29 DIAGNOSIS — E1122 Type 2 diabetes mellitus with diabetic chronic kidney disease: Secondary | ICD-10-CM | POA: Diagnosis not present

## 2016-10-29 DIAGNOSIS — I350 Nonrheumatic aortic (valve) stenosis: Secondary | ICD-10-CM | POA: Diagnosis not present

## 2016-10-30 ENCOUNTER — Ambulatory Visit: Payer: Medicare Other | Admitting: Cardiology

## 2016-10-31 ENCOUNTER — Encounter: Payer: Self-pay | Admitting: Cardiology

## 2016-10-31 ENCOUNTER — Ambulatory Visit (INDEPENDENT_AMBULATORY_CARE_PROVIDER_SITE_OTHER): Payer: Medicare Other | Admitting: Cardiology

## 2016-10-31 ENCOUNTER — Other Ambulatory Visit: Payer: Self-pay | Admitting: Family Medicine

## 2016-10-31 VITALS — BP 116/60 | HR 80 | Ht 66.0 in | Wt 144.4 lb

## 2016-10-31 DIAGNOSIS — I5041 Acute combined systolic (congestive) and diastolic (congestive) heart failure: Secondary | ICD-10-CM

## 2016-10-31 DIAGNOSIS — I350 Nonrheumatic aortic (valve) stenosis: Secondary | ICD-10-CM

## 2016-10-31 DIAGNOSIS — I110 Hypertensive heart disease with heart failure: Secondary | ICD-10-CM

## 2016-10-31 DIAGNOSIS — I10 Essential (primary) hypertension: Secondary | ICD-10-CM | POA: Diagnosis not present

## 2016-10-31 DIAGNOSIS — E1122 Type 2 diabetes mellitus with diabetic chronic kidney disease: Secondary | ICD-10-CM

## 2016-10-31 DIAGNOSIS — N183 Chronic kidney disease, stage 3 (moderate): Secondary | ICD-10-CM

## 2016-10-31 DIAGNOSIS — I43 Cardiomyopathy in diseases classified elsewhere: Secondary | ICD-10-CM

## 2016-10-31 DIAGNOSIS — D649 Anemia, unspecified: Secondary | ICD-10-CM

## 2016-10-31 DIAGNOSIS — E11610 Type 2 diabetes mellitus with diabetic neuropathic arthropathy: Secondary | ICD-10-CM

## 2016-10-31 DIAGNOSIS — I214 Non-ST elevation (NSTEMI) myocardial infarction: Secondary | ICD-10-CM | POA: Diagnosis not present

## 2016-10-31 DIAGNOSIS — I739 Peripheral vascular disease, unspecified: Secondary | ICD-10-CM

## 2016-10-31 DIAGNOSIS — Z79899 Other long term (current) drug therapy: Secondary | ICD-10-CM | POA: Diagnosis not present

## 2016-10-31 DIAGNOSIS — I2510 Atherosclerotic heart disease of native coronary artery without angina pectoris: Secondary | ICD-10-CM | POA: Diagnosis not present

## 2016-10-31 NOTE — Patient Instructions (Signed)
Medication Instructions:  Your physician recommends that you continue on your current medications as directed. Please refer to the Current Medication list given to you today.  Labwork: Your physician recommends that you return for lab work in: TODAY-BMP  Testing/Procedures: NONE   Follow-Up: Your physician recommends that you schedule a follow-up appointment in: 6-8 WEEKS WITH DR [facility]   Any Other Special Instructions Will Be Listed Below (If Applicable).   If you need a refill on your cardiac medications before your next appointment, please call your pharmacy.

## 2016-10-31 NOTE — Assessment & Plan Note (Signed)
Mild to moderate AS, EF 40-45%

## 2016-10-31 NOTE — Progress Notes (Signed)
10/31/2016 Emily Miranda   06-Mar-1941  [id number]  Primary Physician Jeanie Sewer Valrie Hart, MD Primary Cardiologist: Dr Antoine Poche  HPI:  76 y.o. female with history of mild to moderate aortic stenosis, EF 40-45%,  hypertension, HCVD-grade 2 DD, diabetes with chronic Rt foot wound, and CRI-3. She was admitted as a transfer from [facility] 10/06/16 with a NSTEMI. Her course was complicated by CHF requiring several days diuresis (her cath was delayed secondary to persistent CHF), anemia requiring transfusion, and acute on CRI.  Her enzymes are low and flat. She ultimately underwent cath 10/11/16 which showed moderate, diffuse 3v disease. The plan is for medical therapy.   She was discharged 10/16/16 and is in the office today for follow up.She has done pretty well, no chest pain or unusual SOB. She is pretty much wheelchair bound secondary to Rt foot pain. Apparently her chronic Rt foot wound has healed but her foot has atrophied and "the bones have collapsed". She basically couldn't walk on it when PT came to the house. She does have SFA disease on the Rt by doppler but I'm not sure what we would gain by pursuing this since her wound is healed. If she is able to ambulate more in the future she may have claudication and then we could revisit this. She does have CRI and that would increase her risk for complications as well. I will review this with Dr Allyson Sabal.   The pt has a follow up with Dr Marylene Land, her podiatrist, and [facility] [place] to get back on Aranesp.    Current Outpatient Prescriptions  Medication Sig Dispense Refill  . amLODipine (NORVASC) 5 MG tablet Take 5 mg by mouth daily.     . Ascorbic Acid (VITAMIN C) 1000 MG tablet Take 1,000 mg by mouth daily.    Marland Kitchen aspirin EC 81 MG EC tablet Take 1 tablet (81 mg total) by mouth daily. 30 tablet 0  . atorvastatin (LIPITOR) 40 MG tablet Take 1 tablet (40 mg total) by mouth daily at 6 PM. 30 tablet 3  . carvedilol (COREG) 6.25 MG tablet Take 1  tablet (6.25 mg total) by mouth 2 (two) times daily with a meal. 60 tablet 0  . cyclobenzaprine (FLEXERIL) 5 MG tablet Take 1 tablet (5 mg total) by mouth 3 (three) times daily. 30 tablet 0  . fluticasone (FLONASE) 50 MCG/ACT nasal spray Place 1 spray into both nostrils daily as needed for allergies or rhinitis (seasonal allergies).     . furosemide (LASIX) 40 MG tablet Take 1 tablet (40 mg total) by mouth 2 (two) times daily. 60 tablet 0  . glimepiride (AMARYL) 4 MG tablet Take 8 mg by mouth daily with breakfast.     . insulin glargine (LANTUS) 100 UNIT/ML injection Inject 0.12 mLs (12 Units total) into the skin at bedtime. (Patient taking differently: Inject 40 Units into the skin at bedtime. ) 10 mL 11  . isosorbide mononitrate (IMDUR) 60 MG 24 hr tablet Take 1 tablet (60 mg total) by mouth daily. 20 tablet 0  . omeprazole (PRILOSEC) 20 MG capsule Take 20 mg by mouth daily.     Bertram Gala Glycol-Propyl Glycol (SYSTANE OP) Place 1 drop into both eyes daily as needed (dry eyes).    . polyethylene glycol (MIRALAX / GLYCOLAX) packet Take 17 g by mouth daily as needed (constipation). Mix in 8 oz liquid and drink    . TRADJENTA 5 MG TABS tablet Take 5 mg by mouth  daily.  0  . traMADol (ULTRAM) 50 MG tablet Take 1 tablet (50 mg total) by mouth every 8 (eight) hours as needed. (Patient taking differently: Take 50 mg by mouth every 8 (eight) hours as needed for moderate pain. ) 30 tablet 0   No current facility-administered medications for this visit.     Allergies  Allergen Reactions  . Levofloxacin Nausea And Vomiting    Other reaction(s): Confusion  . Gabapentin Other (See Comments)    Disoriented, no strength in legs    Past Medical History:  Diagnosis Date  . Aortic stenosis, mild   . Charcot foot due to diabetes mellitus (HCC)   . CHF (congestive heart failure) (HCC)   . Diabetes mellitus   . HTN (hypertension)   . Hyperlipidemia   . Neuropathy   . Pancreatitis     Social History    Social History  . Marital status: Married    Spouse name: N/A  . Number of children: N/A  . Years of education: N/A   Occupational History  . Not on file.   Social History Main Topics  . Smoking status: Never Smoker  . Smokeless tobacco: Never Used  . Alcohol use No  . Drug use: No  . Sexual activity: Not on file   Other Topics Concern  . Not on file   Social History Narrative  . No narrative on file     Family History  Problem Relation Age of Onset  . Chronic Renal Failure Mother   . Diabetes Father   . Heart attack Father 58     Review of Systems: General: negative for chills, fever, night sweats or weight changes.  Cardiovascular: negative for chest pain, dyspnea on exertion, edema, orthopnea, palpitations, paroxysmal nocturnal dyspnea or shortness of breath Dermatological: negative for rash Respiratory: negative for cough or wheezing Urologic: negative for hematuria Abdominal: negative for nausea, vomiting, diarrhea, bright red blood per rectum, melena, or hematemesis Neurologic: negative for visual changes, syncope, or dizziness All other systems reviewed and are otherwise negative except as noted above.    Blood pressure 116/60, pulse 80, height 5\' 6"  (1.676 m), weight 144 lb 6.4 oz (65.5 kg), SpO2 95 %.  General appearance: alert, cooperative, appears older than stated age, no distress and in wheel chair Neck: no carotid bruit and no JVD Lungs: clear to auscultation bilaterally Heart: regular rate and rhythm and 2/6 systolic murmur Extremities: 2+ edema on Rt (previous negative venous dopplers for DVT) Skin: pale, cool, dry Neurologic: Grossly normal   ASSESSMENT AND PLAN:   NSTEMI (non-ST elevated myocardial infarction) (HCC) Seen in the office today as a post hospital follow up after complicated admission with NSTEMI  CAD in native artery Moderate 3V disease- medical Rx  Mild aortic stenosis Mild to moderate AS, EF 40-45%  Acute combined  systolic and diastolic congestive heart failure (HCC) Pt had CHF on admission 10/06/16- diuresed 8L- DC wgt 135lbs  Hypertensive cardiomyopathy, with heart failure (HCC) Pt has grade 2 DD, moderate AS, moderate LVH, and EF of 40-45%  DM type 2 causing CKD stage 3 (HCC) Pt had AKI during her admission- SCr 1.46 at discharge  PVD (peripheral vascular disease) (HCC) Documented Rt SFA disease by doppler in May 2018  Charcot foot due to diabetes mellitus (HCC) With slow healing ulcer Rt foot- followed by Dr Alinda Dooms in [place]  Chronic anemia Transfused during her admission may 2018 for NSTEMI. She is followed at the [facility] in [place] and  gets OP Aranaesp. Goal Hgb > 8.0 with her CAD   PLAN  Same cardiac Rx. Will ask Dr Allyson Sabal to comment on her doppler study. F/U with Dr Antoine Poche.   [name] PA-C 10/31/2016 4:46 PM

## 2016-10-31 NOTE — Assessment & Plan Note (Signed)
With slow healing ulcer Rt foot- followed by Dr Alinda Dooms in [place]

## 2016-10-31 NOTE — Assessment & Plan Note (Signed)
Pt has grade 2 DD, moderate AS, moderate LVH, and EF of 40-45%

## 2016-10-31 NOTE — Assessment & Plan Note (Signed)
Transfused during her admission may 2018 for NSTEMI. She is followed at the [facility] in [place] and gets OP Aranaesp. Goal Hgb > 8.0 with her CAD

## 2016-10-31 NOTE — Assessment & Plan Note (Signed)
Pt had AKI during her admission- SCr 1.46 at discharge

## 2016-10-31 NOTE — Assessment & Plan Note (Signed)
Documented Rt SFA disease by doppler in May 2018

## 2016-10-31 NOTE — Assessment & Plan Note (Signed)
Seen in the office today as a post hospital follow up after complicated admission with NSTEMI

## 2016-10-31 NOTE — Assessment & Plan Note (Signed)
Moderate 3V disease- medical Rx

## 2016-10-31 NOTE — Assessment & Plan Note (Signed)
Pt had CHF on admission 10/06/16- diuresed 8L- DC wgt 135lbs

## 2016-11-01 ENCOUNTER — Telehealth: Payer: Self-pay | Admitting: *Deleted

## 2016-11-01 DIAGNOSIS — D631 Anemia in chronic kidney disease: Secondary | ICD-10-CM | POA: Diagnosis not present

## 2016-11-01 DIAGNOSIS — Z7982 Long term (current) use of aspirin: Secondary | ICD-10-CM | POA: Diagnosis not present

## 2016-11-01 DIAGNOSIS — I214 Non-ST elevation (NSTEMI) myocardial infarction: Secondary | ICD-10-CM | POA: Diagnosis not present

## 2016-11-01 DIAGNOSIS — E11610 Type 2 diabetes mellitus with diabetic neuropathic arthropathy: Secondary | ICD-10-CM | POA: Diagnosis not present

## 2016-11-01 DIAGNOSIS — E1151 Type 2 diabetes mellitus with diabetic peripheral angiopathy without gangrene: Secondary | ICD-10-CM | POA: Diagnosis not present

## 2016-11-01 DIAGNOSIS — I2510 Atherosclerotic heart disease of native coronary artery without angina pectoris: Secondary | ICD-10-CM | POA: Diagnosis not present

## 2016-11-01 DIAGNOSIS — Z8631 Personal history of diabetic foot ulcer: Secondary | ICD-10-CM | POA: Diagnosis not present

## 2016-11-01 DIAGNOSIS — E1165 Type 2 diabetes mellitus with hyperglycemia: Secondary | ICD-10-CM | POA: Diagnosis not present

## 2016-11-01 DIAGNOSIS — I130 Hypertensive heart and chronic kidney disease with heart failure and stage 1 through stage 4 chronic kidney disease, or unspecified chronic kidney disease: Secondary | ICD-10-CM | POA: Diagnosis not present

## 2016-11-01 DIAGNOSIS — Z794 Long term (current) use of insulin: Secondary | ICD-10-CM | POA: Diagnosis not present

## 2016-11-01 DIAGNOSIS — Z7951 Long term (current) use of inhaled steroids: Secondary | ICD-10-CM | POA: Diagnosis not present

## 2016-11-01 DIAGNOSIS — I5041 Acute combined systolic (congestive) and diastolic (congestive) heart failure: Secondary | ICD-10-CM | POA: Diagnosis not present

## 2016-11-01 DIAGNOSIS — I350 Nonrheumatic aortic (valve) stenosis: Secondary | ICD-10-CM | POA: Diagnosis not present

## 2016-11-01 DIAGNOSIS — E1122 Type 2 diabetes mellitus with diabetic chronic kidney disease: Secondary | ICD-10-CM | POA: Diagnosis not present

## 2016-11-01 DIAGNOSIS — E1142 Type 2 diabetes mellitus with diabetic polyneuropathy: Secondary | ICD-10-CM | POA: Diagnosis not present

## 2016-11-01 DIAGNOSIS — N183 Chronic kidney disease, stage 3 (moderate): Secondary | ICD-10-CM | POA: Diagnosis not present

## 2016-11-01 LAB — BASIC METABOLIC PANEL
BUN/Creatinine Ratio: 26 (ref 12–28)
BUN: 43 mg/dL — ABNORMAL HIGH (ref 8–27)
CO2: 17 mmol/L — ABNORMAL LOW (ref 20–29)
Calcium: 8.7 mg/dL (ref 8.7–10.3)
Chloride: 100 mmol/L (ref 96–106)
Creatinine, Ser: 1.67 mg/dL — ABNORMAL HIGH (ref 0.57–1.00)
GFR calc Af Amer: 34 mL/min/{1.73_m2} — ABNORMAL LOW (ref >59)
GFR calc non Af Amer: 30 mL/min/{1.73_m2} — ABNORMAL LOW (ref >59)
Glucose: 247 mg/dL — ABNORMAL HIGH (ref 65–99)
Potassium: 5 mmol/L (ref 3.5–5.2)
Sodium: 135 mmol/L (ref 134–144)

## 2016-11-01 MED ORDER — FUROSEMIDE 40 MG PO TABS: 40.0000 mg | ORAL_TABLET | Freq: Every day | ORAL | 5 refills | Status: DC

## 2016-11-01 NOTE — Telephone Encounter (Signed)
-----   Message from Abelino Derrick, [place] sent at 11/01/2016 10:20 AM EDT ----- She should hold her next Lasix dose then cut her Lasix back to 40 mg daily  LUKE KILROY PA-C 11/01/2016 10:19 AM

## 2016-11-01 NOTE — Telephone Encounter (Signed)
Results and recommendations discussed with patient, who verbalized understanding and thanks.  

## 2016-11-03 DIAGNOSIS — Z794 Long term (current) use of insulin: Secondary | ICD-10-CM | POA: Diagnosis not present

## 2016-11-03 DIAGNOSIS — I214 Non-ST elevation (NSTEMI) myocardial infarction: Secondary | ICD-10-CM | POA: Diagnosis not present

## 2016-11-03 DIAGNOSIS — G629 Polyneuropathy, unspecified: Secondary | ICD-10-CM | POA: Diagnosis not present

## 2016-11-03 DIAGNOSIS — I130 Hypertensive heart and chronic kidney disease with heart failure and stage 1 through stage 4 chronic kidney disease, or unspecified chronic kidney disease: Secondary | ICD-10-CM | POA: Diagnosis not present

## 2016-11-03 DIAGNOSIS — Z7982 Long term (current) use of aspirin: Secondary | ICD-10-CM | POA: Diagnosis not present

## 2016-11-03 DIAGNOSIS — I440 Atrioventricular block, first degree: Secondary | ICD-10-CM | POA: Diagnosis not present

## 2016-11-03 DIAGNOSIS — S0990XA Unspecified injury of head, initial encounter: Secondary | ICD-10-CM | POA: Diagnosis not present

## 2016-11-03 DIAGNOSIS — K219 Gastro-esophageal reflux disease without esophagitis: Secondary | ICD-10-CM | POA: Diagnosis not present

## 2016-11-03 DIAGNOSIS — D649 Anemia, unspecified: Secondary | ICD-10-CM | POA: Diagnosis not present

## 2016-11-03 DIAGNOSIS — R404 Transient alteration of awareness: Secondary | ICD-10-CM | POA: Diagnosis not present

## 2016-11-03 DIAGNOSIS — M62838 Other muscle spasm: Secondary | ICD-10-CM | POA: Diagnosis not present

## 2016-11-03 DIAGNOSIS — I2510 Atherosclerotic heart disease of native coronary artery without angina pectoris: Secondary | ICD-10-CM | POA: Diagnosis not present

## 2016-11-03 DIAGNOSIS — E1159 Type 2 diabetes mellitus with other circulatory complications: Secondary | ICD-10-CM | POA: Diagnosis not present

## 2016-11-03 DIAGNOSIS — J181 Lobar pneumonia, unspecified organism: Secondary | ICD-10-CM | POA: Diagnosis not present

## 2016-11-03 DIAGNOSIS — I517 Cardiomegaly: Secondary | ICD-10-CM | POA: Diagnosis not present

## 2016-11-03 DIAGNOSIS — Z7984 Long term (current) use of oral hypoglycemic drugs: Secondary | ICD-10-CM | POA: Diagnosis not present

## 2016-11-03 DIAGNOSIS — R531 Weakness: Secondary | ICD-10-CM | POA: Diagnosis not present

## 2016-11-03 DIAGNOSIS — I5033 Acute on chronic diastolic (congestive) heart failure: Secondary | ICD-10-CM | POA: Diagnosis not present

## 2016-11-03 DIAGNOSIS — A419 Sepsis, unspecified organism: Secondary | ICD-10-CM | POA: Diagnosis not present

## 2016-11-03 DIAGNOSIS — I350 Nonrheumatic aortic (valve) stenosis: Secondary | ICD-10-CM | POA: Diagnosis not present

## 2016-11-03 DIAGNOSIS — E1122 Type 2 diabetes mellitus with diabetic chronic kidney disease: Secondary | ICD-10-CM | POA: Diagnosis not present

## 2016-11-03 DIAGNOSIS — J189 Pneumonia, unspecified organism: Secondary | ICD-10-CM | POA: Diagnosis not present

## 2016-11-03 DIAGNOSIS — I252 Old myocardial infarction: Secondary | ICD-10-CM | POA: Diagnosis not present

## 2016-11-03 DIAGNOSIS — J0100 Acute maxillary sinusitis, unspecified: Secondary | ICD-10-CM | POA: Diagnosis not present

## 2016-11-03 DIAGNOSIS — N183 Chronic kidney disease, stage 3 (moderate): Secondary | ICD-10-CM | POA: Diagnosis not present

## 2016-11-04 ENCOUNTER — Other Ambulatory Visit: Payer: Self-pay

## 2016-11-04 ENCOUNTER — Inpatient Hospital Stay (HOSPITAL_COMMUNITY): Payer: Medicare Other

## 2016-11-04 ENCOUNTER — Encounter (HOSPITAL_COMMUNITY): Payer: Self-pay | Admitting: General Practice

## 2016-11-04 ENCOUNTER — Ambulatory Visit (HOSPITAL_COMMUNITY)
Admission: AD | Admit: 2016-11-04 | Discharge: 2016-11-04 | Disposition: A | Discharge status: Home / Self Care | Payer: Medicare Other | Source: Other Acute Inpatient Hospital | Attending: Cardiovascular Disease | Admitting: Cardiovascular Disease

## 2016-11-04 ENCOUNTER — Inpatient Hospital Stay (HOSPITAL_COMMUNITY)
Admission: AD | Admit: 2016-11-04 | Discharge: 2016-11-09 | DRG: 246 | Disposition: A | Discharge status: Home Health | Payer: Medicare Other | Source: Other Acute Inpatient Hospital | Attending: Cardiology | Admitting: Cardiology

## 2016-11-04 DIAGNOSIS — R0789 Other chest pain: Secondary | ICD-10-CM | POA: Diagnosis not present

## 2016-11-04 DIAGNOSIS — Z993 Dependence on wheelchair: Secondary | ICD-10-CM

## 2016-11-04 DIAGNOSIS — Z881 Allergy status to other antibiotic agents status: Secondary | ICD-10-CM

## 2016-11-04 DIAGNOSIS — I350 Nonrheumatic aortic (valve) stenosis: Secondary | ICD-10-CM | POA: Diagnosis not present

## 2016-11-04 DIAGNOSIS — E11610 Type 2 diabetes mellitus with diabetic neuropathic arthropathy: Secondary | ICD-10-CM | POA: Diagnosis not present

## 2016-11-04 DIAGNOSIS — E1151 Type 2 diabetes mellitus with diabetic peripheral angiopathy without gangrene: Secondary | ICD-10-CM | POA: Diagnosis not present

## 2016-11-04 DIAGNOSIS — I10 Essential (primary) hypertension: Secondary | ICD-10-CM

## 2016-11-04 DIAGNOSIS — I214 Non-ST elevation (NSTEMI) myocardial infarction: Secondary | ICD-10-CM | POA: Diagnosis not present

## 2016-11-04 DIAGNOSIS — I5033 Acute on chronic diastolic (congestive) heart failure: Secondary | ICD-10-CM | POA: Diagnosis not present

## 2016-11-04 DIAGNOSIS — R0602 Shortness of breath: Secondary | ICD-10-CM

## 2016-11-04 DIAGNOSIS — I200 Unstable angina: Secondary | ICD-10-CM | POA: Diagnosis not present

## 2016-11-04 DIAGNOSIS — Z886 Allergy status to analgesic agent status: Secondary | ICD-10-CM

## 2016-11-04 DIAGNOSIS — I5042 Chronic combined systolic (congestive) and diastolic (congestive) heart failure: Secondary | ICD-10-CM

## 2016-11-04 DIAGNOSIS — D649 Anemia, unspecified: Secondary | ICD-10-CM | POA: Diagnosis present

## 2016-11-04 DIAGNOSIS — I43 Cardiomyopathy in diseases classified elsewhere: Secondary | ICD-10-CM | POA: Diagnosis present

## 2016-11-04 DIAGNOSIS — L97519 Non-pressure chronic ulcer of other part of right foot with unspecified severity: Secondary | ICD-10-CM

## 2016-11-04 DIAGNOSIS — Z888 Allergy status to other drugs, medicaments and biological substances status: Secondary | ICD-10-CM

## 2016-11-04 DIAGNOSIS — N183 Chronic kidney disease, stage 3 unspecified: Secondary | ICD-10-CM | POA: Diagnosis present

## 2016-11-04 DIAGNOSIS — R079 Chest pain, unspecified: Secondary | ICD-10-CM

## 2016-11-04 DIAGNOSIS — I5043 Acute on chronic combined systolic (congestive) and diastolic (congestive) heart failure: Secondary | ICD-10-CM | POA: Diagnosis not present

## 2016-11-04 DIAGNOSIS — I2510 Atherosclerotic heart disease of native coronary artery without angina pectoris: Secondary | ICD-10-CM

## 2016-11-04 DIAGNOSIS — E782 Mixed hyperlipidemia: Secondary | ICD-10-CM | POA: Diagnosis present

## 2016-11-04 DIAGNOSIS — Z955 Presence of coronary angioplasty implant and graft: Secondary | ICD-10-CM

## 2016-11-04 DIAGNOSIS — I25110 Atherosclerotic heart disease of native coronary artery with unstable angina pectoris: Secondary | ICD-10-CM | POA: Diagnosis not present

## 2016-11-04 DIAGNOSIS — J189 Pneumonia, unspecified organism: Secondary | ICD-10-CM | POA: Diagnosis not present

## 2016-11-04 DIAGNOSIS — E11621 Type 2 diabetes mellitus with foot ulcer: Secondary | ICD-10-CM | POA: Diagnosis present

## 2016-11-04 DIAGNOSIS — Z90710 Acquired absence of both cervix and uterus: Secondary | ICD-10-CM

## 2016-11-04 DIAGNOSIS — J0100 Acute maxillary sinusitis, unspecified: Secondary | ICD-10-CM | POA: Diagnosis not present

## 2016-11-04 DIAGNOSIS — E1122 Type 2 diabetes mellitus with diabetic chronic kidney disease: Secondary | ICD-10-CM | POA: Diagnosis not present

## 2016-11-04 DIAGNOSIS — I110 Hypertensive heart disease with heart failure: Secondary | ICD-10-CM | POA: Diagnosis present

## 2016-11-04 DIAGNOSIS — K219 Gastro-esophageal reflux disease without esophagitis: Secondary | ICD-10-CM

## 2016-11-04 DIAGNOSIS — Z833 Family history of diabetes mellitus: Secondary | ICD-10-CM | POA: Diagnosis not present

## 2016-11-04 DIAGNOSIS — Z8249 Family history of ischemic heart disease and other diseases of the circulatory system: Secondary | ICD-10-CM | POA: Diagnosis not present

## 2016-11-04 DIAGNOSIS — Z9861 Coronary angioplasty status: Secondary | ICD-10-CM

## 2016-11-04 DIAGNOSIS — E1165 Type 2 diabetes mellitus with hyperglycemia: Secondary | ICD-10-CM | POA: Diagnosis not present

## 2016-11-04 DIAGNOSIS — E119 Type 2 diabetes mellitus without complications: Secondary | ICD-10-CM | POA: Diagnosis not present

## 2016-11-04 HISTORY — DX: Atherosclerotic heart disease of native coronary artery without angina pectoris: I25.10

## 2016-11-04 HISTORY — DX: Other malaise: R53.81

## 2016-11-04 HISTORY — DX: Chronic combined systolic (congestive) and diastolic (congestive) heart failure: I50.42

## 2016-11-04 LAB — BASIC METABOLIC PANEL
Anion gap: 10 (ref 5–15)
BUN: 26 mg/dL — ABNORMAL HIGH (ref 6–20)
CO2: 17 mmol/L — ABNORMAL LOW (ref 22–32)
Calcium: 8.2 mg/dL — ABNORMAL LOW (ref 8.9–10.3)
Chloride: 109 mmol/L (ref 101–111)
Creatinine, Ser: 1.35 mg/dL — ABNORMAL HIGH (ref 0.44–1.00)
GFR calc Af Amer: 44 mL/min — ABNORMAL LOW (ref >60)
GFR calc non Af Amer: 38 mL/min — ABNORMAL LOW (ref >60)
Glucose, Bld: 236 mg/dL — ABNORMAL HIGH (ref 65–99)
Potassium: 4.1 mmol/L (ref 3.5–5.1)
Sodium: 136 mmol/L (ref 135–145)

## 2016-11-04 LAB — CBC
HCT: 29.4 % — ABNORMAL LOW (ref 36.0–46.0)
Hemoglobin: 8.8 g/dL — ABNORMAL LOW (ref 12.0–15.0)
MCH: 24.4 pg — ABNORMAL LOW (ref 26.0–34.0)
MCHC: 29.9 g/dL — ABNORMAL LOW (ref 30.0–36.0)
MCV: 81.4 fL (ref 78.0–100.0)
Platelets: 477 10*3/uL — ABNORMAL HIGH (ref 150–400)
RBC: 3.61 MIL/uL — ABNORMAL LOW (ref 3.87–5.11)
RDW: 18.8 % — ABNORMAL HIGH (ref 11.5–15.5)
WBC: 9.3 10*3/uL (ref 4.0–10.5)

## 2016-11-04 LAB — MRSA PCR SCREENING: MRSA by PCR: NEGATIVE

## 2016-11-04 MED ORDER — SODIUM CHLORIDE 0.9 % IV SOLN: 250.0000 mL | INTRAVENOUS | Status: DC | PRN

## 2016-11-04 MED ORDER — CYCLOBENZAPRINE HCL 10 MG PO TABS
5.0000 mg | ORAL_TABLET | Freq: Three times a day (TID) | ORAL | Status: DC
  Administered 2016-11-04 – 2016-11-09 (×13): 5 mg via ORAL
  Filled 2016-11-04 (×13): qty 1

## 2016-11-04 MED ORDER — ONDANSETRON HCL 4 MG/2ML IJ SOLN: 4.0000 mg | Freq: Four times a day (QID) | INTRAMUSCULAR | Status: DC | PRN

## 2016-11-04 MED ORDER — HEPARIN SODIUM (PORCINE) 5000 UNIT/ML IJ SOLN
5000.0000 [IU] | Freq: Three times a day (TID) | INTRAMUSCULAR | Status: DC
  Administered 2016-11-04 – 2016-11-09 (×13): 5000 [IU] via SUBCUTANEOUS
  Filled 2016-11-04 (×13): qty 1

## 2016-11-04 MED ORDER — NITROGLYCERIN IN D5W 200-5 MCG/ML-% IV SOLN
30.0000 ug/min | INTRAVENOUS | Status: DC
  Administered 2016-11-04: 30 ug/min via INTRAVENOUS
  Administered 2016-11-05: 55 ug/min via INTRAVENOUS
  Administered 2016-11-05: 50 ug/min via INTRAVENOUS
  Filled 2016-11-04: qty 250

## 2016-11-04 MED ORDER — SODIUM CHLORIDE 0.9% FLUSH
3.0000 mL | Freq: Two times a day (BID) | INTRAVENOUS | Status: DC
  Administered 2016-11-04 – 2016-11-05 (×2): 3 mL via INTRAVENOUS

## 2016-11-04 MED ORDER — ASPIRIN EC 81 MG PO TBEC
81.0000 mg | DELAYED_RELEASE_TABLET | Freq: Every day | ORAL | Status: DC
  Administered 2016-11-05 – 2016-11-09 (×5): 81 mg via ORAL
  Filled 2016-11-04 (×5): qty 1

## 2016-11-04 MED ORDER — ISOSORBIDE MONONITRATE ER 60 MG PO TB24: 60.0000 mg | ORAL_TABLET | Freq: Every day | ORAL | Status: DC

## 2016-11-04 MED ORDER — GLIMEPIRIDE 2 MG PO TABS
2.0000 mg | ORAL_TABLET | Freq: Every day | ORAL | Status: DC
  Administered 2016-11-06 – 2016-11-09 (×4): 2 mg via ORAL
  Filled 2016-11-04: qty 2
  Filled 2016-11-04 (×5): qty 1

## 2016-11-04 MED ORDER — PANTOPRAZOLE SODIUM 40 MG PO TBEC
40.0000 mg | DELAYED_RELEASE_TABLET | Freq: Every day | ORAL | Status: DC
  Administered 2016-11-04 – 2016-11-09 (×6): 40 mg via ORAL
  Filled 2016-11-04 (×6): qty 1

## 2016-11-04 MED ORDER — MORPHINE SULFATE (PF) 4 MG/ML IV SOLN
INTRAVENOUS | Status: AC
  Filled 2016-11-04: qty 1

## 2016-11-04 MED ORDER — LINAGLIPTIN 5 MG PO TABS
5.0000 mg | ORAL_TABLET | Freq: Every day | ORAL | Status: DC
  Administered 2016-11-06 – 2016-11-09 (×4): 5 mg via ORAL
  Filled 2016-11-04 (×5): qty 1

## 2016-11-04 MED ORDER — ASPIRIN 81 MG PO CHEW
81.0000 mg | CHEWABLE_TABLET | ORAL | Status: AC
  Administered 2016-11-05: 81 mg via ORAL
  Filled 2016-11-04: qty 1

## 2016-11-04 MED ORDER — CARVEDILOL 6.25 MG PO TABS
6.2500 mg | ORAL_TABLET | Freq: Two times a day (BID) | ORAL | Status: DC
  Administered 2016-11-04 – 2016-11-09 (×10): 6.25 mg via ORAL
  Filled 2016-11-04 (×10): qty 1

## 2016-11-04 MED ORDER — ACETAMINOPHEN 325 MG PO TABS
650.0000 mg | ORAL_TABLET | ORAL | Status: DC | PRN
  Administered 2016-11-05 – 2016-11-09 (×7): 650 mg via ORAL
  Filled 2016-11-04 (×7): qty 2

## 2016-11-04 MED ORDER — MORPHINE SULFATE (PF) 4 MG/ML IV SOLN
2.0000 mg | INTRAVENOUS | Status: DC | PRN
  Administered 2016-11-04 – 2016-11-07 (×12): 2 mg via INTRAVENOUS
  Filled 2016-11-04 (×11): qty 1

## 2016-11-04 MED ORDER — AMLODIPINE BESYLATE 5 MG PO TABS: 5.0000 mg | ORAL_TABLET | Freq: Every day | ORAL | Status: DC

## 2016-11-04 MED ORDER — ATORVASTATIN CALCIUM 40 MG PO TABS
40.0000 mg | ORAL_TABLET | Freq: Every day | ORAL | Status: DC
  Administered 2016-11-04 – 2016-11-08 (×5): 40 mg via ORAL
  Filled 2016-11-04 (×6): qty 1

## 2016-11-04 MED ORDER — NITROGLYCERIN 0.4 MG SL SUBL: 0.4000 mg | SUBLINGUAL_TABLET | SUBLINGUAL | Status: DC | PRN

## 2016-11-04 MED ORDER — SODIUM CHLORIDE 0.9% FLUSH: 3.0000 mL | INTRAVENOUS | Status: DC | PRN

## 2016-11-04 MED ORDER — SODIUM CHLORIDE 0.9 % IV SOLN
INTRAVENOUS | Status: DC
  Administered 2016-11-05: 06:00:00 via INTRAVENOUS

## 2016-11-04 MED ORDER — ASPIRIN 81 MG PO TBEC: 81.0000 mg | DELAYED_RELEASE_TABLET | Freq: Every day | ORAL | Status: DC

## 2016-11-04 NOTE — H&P (Signed)
Patient ID: Emily Miranda MRN: [id number], DOB/AGE: 75-Nov-1942   Admit date: 11/04/2016   Primary Physician: Noni Saupe, MD Primary Cardiologist: Dr. Antoine Poche    Pt. Profile:  76 y.o.female, followed by Dr. Ladoris Gene history of mild to moderate aortic stenosis, combined systolic and diastolic HF w/ EF of 40-45%, G2DD, hypertension, diabetes with chronic Rt foot wound,  CRI-3 and recent admission for NSTEMI 10/2016 where LHC showed 3V CAD with 90% stenosis in the midportion of a small rPDA, treated medically, being transferred from [facility] for recurrent unstable angina. Plan is for attempted PCI of the rPDA.   Problem List  Past Medical History:  Diagnosis Date  . Aortic stenosis, mild   . Charcot foot due to diabetes mellitus (HCC)   . CHF (congestive heart failure) (HCC)   . Coronary artery disease   . Diabetes mellitus   . Dyspnea   . HTN (hypertension)   . Hyperlipidemia   . Neuropathy   . Pancreatitis     Past Surgical History:  Procedure Laterality Date  . ABDOMINAL HYSTERECTOMY    . APPENDECTOMY    . FOOT SURGERY    . LAPAROSCOPIC INCISIONAL / UMBILICAL / VENTRAL HERNIA REPAIR  01/04/2008   Dr Bertram Savin  . LAPAROSCOPIC LYSIS OF ADHESIONS  2007   Dr Donovan Kail  . LEFT HEART CATH AND CORONARY ANGIOGRAPHY N/A 10/11/2016   Procedure: Left Heart Cath and Coronary Angiography;  Surgeon: Yvonne Kendall, MD;  Location: [facility] INVASIVE CV LAB;  Service: Cardiovascular;  Laterality: N/A;  . OVARIAN CYST REMOVAL    . ROBOT ASSISTED PYELOPLASTY  02/2007   Dr Laverle Patter  . SUTURE REMOVAL  08/17/2009   Dr Bertram Savin .  Right paramedian GoreTex stitch  . TONSILLECTOMY       Allergies  Allergies  Allergen Reactions  . Levofloxacin Nausea And Vomiting    Other reaction(s): Confusion  . Gabapentin Other (See Comments)    Disoriented, no strength in legs    HPI  76 y.o.female, followed by Dr. Ladoris Gene history of mild to moderate aortic  stenosis, combined systolic and diastolic HF w/ EF of 40-45%, G2DD, hypertension, diabetes with chronic Rt foot wound, and CRI-3. She was recently  admitted as a transfer from [facility] 10/06/16 with a NSTEMI. Her course was complicated by CHF requiring several days diuresis (her cath was delayed secondary to persistent CHF), anemia requiring transfusion, and acute on CRI.  Her cardiac enzymes are low and flat. She ultimately underwent cath 10/11/16 which showed moderate, diffuse 3v disease, including diffuse LAD disease with sequential 50% lesions, sequential 70% and 60% mid LCx stenosis, 50% mid RCA lesion, and 90% stenosis in midportion of small rPDA. Initial plan was for medical therapy. However she has developed recurrent unstable angina and was seen today at [facility] ED and has been transferred to [facility] for repeat [facility] for planned intervention of her PDA. Also recent history of HAP, treated at [facility] several weeks ago.  She has been started on IV nitro and is currently CP free. BP and HR both stable. Her baseline SCr is ~1.3-1.6.  BMP at [facility] 6/24 showed SCr at 1.3. CXR showed mild cardiomegaly with ventral vascular congestion. Probable small right pleural effusion. Bibasilar atelectasis or infiltrates, slightly improved at the left base compared to prior study. No pneumothorax. She denies fever and chills.   Home Medications  Prior to Admission medications   Medication Sig Start Date End Date Taking?  Authorizing Provider  amLODipine (NORVASC) 5 MG tablet Take 5 mg by mouth daily.  05/20/13   [provider]  Ascorbic Acid (VITAMIN C) 1000 MG tablet Take 1,000 mg by mouth daily.    [provider]  aspirin EC 81 MG EC tablet Take 1 tablet (81 mg total) by mouth daily. 08/06/16   Garth Bigness, MD  atorvastatin (LIPITOR) 40 MG tablet Take 1 tablet (40 mg total) by mouth daily at 6 PM. 08/05/16   Garth Bigness, MD  carvedilol (COREG) 6.25 MG tablet Take 1  tablet (6.25 mg total) by mouth 2 (two) times daily with a meal. 10/16/16   Rhetta Mura, MD  cyclobenzaprine (FLEXERIL) 5 MG tablet Take 1 tablet (5 mg total) by mouth 3 (three) times daily. 10/16/16   Rhetta Mura, MD  fluticasone (FLONASE) 50 MCG/ACT nasal spray Place 1 spray into both nostrils daily as needed for allergies or rhinitis (seasonal allergies).  02/05/16   [provider]  furosemide (LASIX) 40 MG tablet Take 1 tablet (40 mg total) by mouth daily. 11/01/16   Abelino Derrick, PA-C  glimepiride (AMARYL) 4 MG tablet Take 8 mg by mouth daily with breakfast.  08/23/16   [provider]  insulin glargine (LANTUS) 100 UNIT/ML injection Inject 0.12 mLs (12 Units total) into the skin at bedtime. Patient taking differently: Inject 40 Units into the skin at bedtime.  08/05/16   Garth Bigness, MD  isosorbide mononitrate (IMDUR) 60 MG 24 hr tablet Take 1 tablet (60 mg total) by mouth daily. 10/17/16   Rhetta Mura, MD  omeprazole (PRILOSEC) 20 MG capsule Take 20 mg by mouth daily.  12/09/15   [provider]  Polyethyl Glycol-Propyl Glycol (SYSTANE OP) Place 1 drop into both eyes daily as needed (dry eyes).    [provider]  polyethylene glycol (MIRALAX / GLYCOLAX) packet Take 17 g by mouth daily as needed (constipation). Mix in 8 oz liquid and drink    [provider]  TRADJENTA 5 MG TABS tablet Take 5 mg by mouth daily. 09/10/16   [provider]  traMADol (ULTRAM) 50 MG tablet Take 1 tablet (50 mg total) by mouth every 8 (eight) hours as needed. Patient taking differently: Take 50 mg by mouth every 8 (eight) hours as needed for moderate pain.  09/13/16   Asencion Islam, DPM    Family History  Family History  Problem Relation Age of Onset  . Chronic Renal Failure Mother   . Diabetes Father   . Heart attack Father 58    Social History  Social History   Social History  . Marital status: Married    Spouse name:  N/A  . Number of children: N/A  . Years of education: N/A   Occupational History  . Not on file.   Social History Main Topics  . Smoking status: Never Smoker  . Smokeless tobacco: Never Used  . Alcohol use No  . Drug use: No  . Sexual activity: Not on file   Other Topics Concern  . Not on file   Social History Narrative  . No narrative on file     Review of Systems General:  No chills, fever, night sweats or weight changes.  Cardiovascular:  No chest pain, dyspnea on exertion, edema, orthopnea, palpitations, paroxysmal nocturnal dyspnea. Dermatological: No rash, lesions/masses Respiratory: No cough, dyspnea Urologic: No hematuria, dysuria Abdominal:   No nausea, vomiting, diarrhea, bright red blood per rectum, melena, or hematemesis Neurologic:  No visual  changes, wkns, changes in mental status. All other systems reviewed and are otherwise negative except as noted above.  Physical Exam  Blood pressure 127/77, pulse 79, temperature 97.6 F (36.4 C), temperature source Oral, resp. rate (!) 23, height 5\' 6"  (1.676 m), weight 154 lb 6.4 oz (70 kg), SpO2 100 %.  General: Pleasant, NAD Psych: Normal affect. Neuro: Alert and oriented X 3. Moves all extremities spontaneously. HEENT: Normal  Neck: Supple without bruits or JVD. Lungs:  Resp regular and unlabored, slight crackles at the left lower lung Heart: RRR no s3, s4, or murmurs. Abdomen: Soft, non-tender, non-distended, BS + x 4.  Extremities: + right sided foot clubbing with chronic 2+ edema on the right,  No cyanosis. Left LE w/o edema. P/PT/Radials 2+ and equal bilaterally.  Labs  Troponin (Point of Care Test) No results for input(s): TROPIPOC in the last 72 hours. No results for input(s): CKTOTAL, CKMB, TROPONINI in the last 72 hours. Lab Results  Component Value Date   WBC 8.6 10/12/2016   HGB 9.5 (L) 10/12/2016   HCT 30.6 (L) 10/12/2016   MCV 83.2 10/12/2016   PLT 479 (H) 10/12/2016     Recent Labs Lab  10/31/16 1519  NA 135  K 5.0  CL 100  CO2 17*  BUN 43*  CREATININE 1.67*  CALCIUM 8.7  GLUCOSE 247*   Lab Results  Component Value Date   CHOL 125 10/07/2016   HDL 34 (L) 10/07/2016   LDLCALC 72 10/07/2016   TRIG 97 10/07/2016   Lab Results  Component Value Date   DDIMER 8.35 (H) 07/29/2016     Radiology/Studies  Dg Chest 2 View  Result Date: 10/13/2016 CLINICAL DATA:  CHF, chest tightness EXAM: CHEST  2 VIEW COMPARISON:  10/06/2016 FINDINGS: Small bilateral pleural effusions. Bibasilar atelectasis or infiltrates. Mild cardiomegaly. No acute bony abnormality. IMPRESSION: Small bilateral pleural effusions with bibasilar atelectasis or infiltrate. Electronically Signed   By: Charlett Nose M.D.   On: 10/13/2016 14:18   Dg Chest Port 1 View  Result Date: 10/06/2016 CLINICAL DATA:  Shortness of breath with left-sided chest pain. History of hypertension, diabetes and congestive heart failure. EXAM: PORTABLE CHEST 1 VIEW COMPARISON:  Radiographs 10/06/2016.  CT 08/01/2016. FINDINGS: 1655 hour. The heart size and mediastinal contours are stable. There are small bilateral pleural effusions with mild bibasilar atelectasis, vascular congestion and probable mild edema. No confluent airspace opacity or pneumothorax. No acute osseous findings are seen. Telemetry leads overlie the chest. IMPRESSION: Probable mild congestive heart failure with mild edema and small bilateral pleural effusions. Electronically Signed   By: Carey Bullocks M.D.   On: 10/06/2016 17:03    ECG  [facility] EKG 11/04/16 NSR with 1st degree AV block -- personally reviewed  Telemetry  NSR -- personally reviewed    LHC 10/11/16 Procedures   Left Heart Cath and Coronary Angiography  Conclusion   Conclusions: 1. Three vessel coronary artery disease, including diffuse LAD disease with sequential 50% lesions, sequential 70% and 60% mid LCx stenosis, 50% mid RCA lesion, and 90% stenosis in midportion of small  rPDA. 2. Mildly elevated left ventricular filling pressure. 3. Moderately elevated aortic valve gradient (peak-to-peak gradient 25 mmHg).  Recommendations: 1. Medical therapy given improvement of chest pain and shortness with blood transfusion and diuresis. 2. If symptoms worsen despite stable hemoglobin and optimized volume status, FFR/PCI to the LCx could be performed. I would defer PCI to PDA given its small size.  ASSESSMENT AND PLAN  Principal Problem:   Unstable angina (HCC) Active Problems:   DM type 2 causing CKD stage 3 (HCC)   Mild aortic stenosis   Chronic renal insufficiency, stage 3 (moderate)   Chronic diabetic ulcer of right foot    Mixed hyperlipidemia   CAD in native artery   1. 3V CAD w/ Unstable Angina: pt has failed medical therapy. Recently admitted for NSTEMI 10/2016 where [facility] showed  moderate, diffuse 3v disease, including diffuse LAD disease with sequential 50% lesions, sequential 70% and 60% mid LCx stenosis, 50% mid RCA lesion, and 90% stenosis in midportion of small rPDA. Initial plan was for medical therapy. However she has developed recurrent unstable angina and was seen today at [facility] ED and has been transferred to [facility] for repeat [facility] for planned intervention of her PDA. Make NPO at midnight. We will obtain BMP upon arrival. She has chronic renal insufficiency and willl need light hydration, however will need to be cautious given her chronic systolic HF (EF 40-45%). Hold home lasix dose in the AM.   Pt is scheduled for [facility] with Dr. Tresa Endo 6/26 at 1330. Plan for attempted PCI of the rPDA which is 90% stenosed and FFR testing of the LCx with possible PCI, if hemodynamically significant.     Signed, Robbie Lis, PA-C, MHS 11/04/2016, 4:47 PM [facility] Pager: [phone]  History and all data above reviewed.  Patient examined.  I agree with the findings as above.  She presents with symptoms consistent with unstable angina.  I did  review the cath films for this admission.  She has a very tight PDA and a circ that was thought to be borderline and might require a FFR.  Her symptoms have been severe resting chest pain.  She has severe PVD and has had a non healing right foot ulcer with leg swelling.  We were to arrange out patient PV consult.  She also reports that she has had fevers at home.  Her WBC is slightly elevated.  However, she is afebrile currently.  The patient exam reveals [id number] with ectopy and mild AS  ,  Lungs: Bilateral basilar crackles  ,  Abd: Positive bowel sounds, no rebound no guarding, Ext Right leg swelling with decreased DP/PT  .  All available labs, radiology testing, previous records reviewed. Agree with documented assessment and plan. Unstable angina:  Needs cath for possible PCI.  I am going to hold her Lasix.  Need to limit dye with CKD.  Hydration to start in the AM.  Might need to defer study if she has increased creat in AM.  Fever:  I don't see evidence of this.  She was treated for a pneumonia last week.  I will follow up on a portable CXR.  I do not think that the right leg is a culprit.  However, low threshold for atb if she has documented fevers.    Fayrene Fearing Triston Lisanti  6:06 PM  11/04/2016

## 2016-11-04 NOTE — Progress Notes (Signed)
Pt with chest pain and is on NTG drip, she had failed outpt therapy.  Will titrate ntg.  cxr was neg for infiltrate.  If any fevers please call for blood cultures.

## 2016-11-05 ENCOUNTER — Inpatient Hospital Stay (HOSPITAL_COMMUNITY)
Admission: AD | Disposition: A | Discharge status: Home Health | Payer: Self-pay | Source: Other Acute Inpatient Hospital | Attending: Cardiology

## 2016-11-05 DIAGNOSIS — N183 Chronic kidney disease, stage 3 (moderate): Secondary | ICD-10-CM

## 2016-11-05 DIAGNOSIS — E119 Type 2 diabetes mellitus without complications: Secondary | ICD-10-CM

## 2016-11-05 DIAGNOSIS — D649 Anemia, unspecified: Secondary | ICD-10-CM

## 2016-11-05 DIAGNOSIS — I25110 Atherosclerotic heart disease of native coronary artery with unstable angina pectoris: Principal | ICD-10-CM

## 2016-11-05 HISTORY — PX: CORONARY STENT INTERVENTION: CATH118234

## 2016-11-05 LAB — BASIC METABOLIC PANEL
Anion gap: 11 (ref 5–15)
BUN: 28 mg/dL — ABNORMAL HIGH (ref 6–20)
CO2: 15 mmol/L — ABNORMAL LOW (ref 22–32)
Calcium: 8.3 mg/dL — ABNORMAL LOW (ref 8.9–10.3)
Chloride: 108 mmol/L (ref 101–111)
Creatinine, Ser: 1.4 mg/dL — ABNORMAL HIGH (ref 0.44–1.00)
GFR calc Af Amer: 42 mL/min — ABNORMAL LOW (ref >60)
GFR calc non Af Amer: 36 mL/min — ABNORMAL LOW (ref >60)
Glucose, Bld: 255 mg/dL — ABNORMAL HIGH (ref 65–99)
Potassium: 4.8 mmol/L (ref 3.5–5.1)
Sodium: 134 mmol/L — ABNORMAL LOW (ref 135–145)

## 2016-11-05 LAB — CBC
HCT: 27.1 % — ABNORMAL LOW (ref 36.0–46.0)
Hemoglobin: 8.1 g/dL — ABNORMAL LOW (ref 12.0–15.0)
MCH: 24.4 pg — ABNORMAL LOW (ref 26.0–34.0)
MCHC: 29.9 g/dL — ABNORMAL LOW (ref 30.0–36.0)
MCV: 81.6 fL (ref 78.0–100.0)
Platelets: 470 10*3/uL — ABNORMAL HIGH (ref 150–400)
RBC: 3.32 MIL/uL — ABNORMAL LOW (ref 3.87–5.11)
RDW: 18.9 % — ABNORMAL HIGH (ref 11.5–15.5)
WBC: 9.1 10*3/uL (ref 4.0–10.5)

## 2016-11-05 LAB — GLUCOSE, CAPILLARY
Glucose-Capillary: 251 mg/dL — ABNORMAL HIGH (ref 65–99)
Glucose-Capillary: 228 mg/dL — ABNORMAL HIGH (ref 65–99)
Glucose-Capillary: 126 mg/dL — ABNORMAL HIGH (ref 65–99)
Glucose-Capillary: 83 mg/dL (ref 65–99)

## 2016-11-05 LAB — POCT ACTIVATED CLOTTING TIME: Activated Clotting Time: 488 seconds

## 2016-11-05 LAB — PROTIME-INR
INR: 1.22
Prothrombin Time: 15.5 seconds — ABNORMAL HIGH (ref 11.4–15.2)

## 2016-11-05 SURGERY — CORONARY STENT INTERVENTION
Anesthesia: LOCAL

## 2016-11-05 MED ORDER — ACETAMINOPHEN 325 MG PO TABS: 650.0000 mg | ORAL_TABLET | ORAL | Status: DC | PRN

## 2016-11-05 MED ORDER — SODIUM CHLORIDE 0.9% FLUSH
3.0000 mL | Freq: Two times a day (BID) | INTRAVENOUS | Status: DC
  Administered 2016-11-05 – 2016-11-08 (×5): 3 mL via INTRAVENOUS

## 2016-11-05 MED ORDER — SODIUM CHLORIDE 0.9 % IV SOLN
INTRAVENOUS | Status: DC
  Administered 2016-11-06: 1000 mL via INTRAVENOUS

## 2016-11-05 MED ORDER — NITROGLYCERIN 1 MG/10 ML FOR IR/CATH LAB
INTRA_ARTERIAL | Status: DC | PRN
  Administered 2016-11-05 (×3): 200 ug via INTRACORONARY

## 2016-11-05 MED ORDER — SODIUM CHLORIDE 0.9 % IV SOLN
INTRAVENOUS | Status: DC | PRN
  Administered 2016-11-05: 1.75 mg/kg/h via INTRAVENOUS

## 2016-11-05 MED ORDER — IOPAMIDOL (ISOVUE-370) INJECTION 76%
INTRAVENOUS | Status: AC
  Filled 2016-11-05: qty 125

## 2016-11-05 MED ORDER — MIDAZOLAM HCL 2 MG/2ML IJ SOLN
INTRAMUSCULAR | Status: DC | PRN
  Administered 2016-11-05: 1 mg via INTRAVENOUS

## 2016-11-05 MED ORDER — HEPARIN (PORCINE) IN NACL 2-0.9 UNIT/ML-% IJ SOLN
INTRAMUSCULAR | Status: AC | PRN
  Administered 2016-11-05: 1000 mL

## 2016-11-05 MED ORDER — ONDANSETRON HCL 4 MG/2ML IJ SOLN: 4.0000 mg | Freq: Four times a day (QID) | INTRAMUSCULAR | Status: DC | PRN

## 2016-11-05 MED ORDER — INSULIN ASPART 100 UNIT/ML ~~LOC~~ SOLN
0.0000 [IU] | SUBCUTANEOUS | Status: DC
  Administered 2016-11-05: 8 [IU] via SUBCUTANEOUS
  Administered 2016-11-05: 2 [IU] via SUBCUTANEOUS
  Administered 2016-11-05 – 2016-11-06 (×2): 5 [IU] via SUBCUTANEOUS
  Administered 2016-11-06: 2 [IU] via SUBCUTANEOUS
  Administered 2016-11-06: 5 [IU] via SUBCUTANEOUS
  Administered 2016-11-06: 2 [IU] via SUBCUTANEOUS
  Administered 2016-11-06 – 2016-11-07 (×2): 5 [IU] via SUBCUTANEOUS
  Administered 2016-11-07 – 2016-11-08 (×2): 2 [IU] via SUBCUTANEOUS
  Administered 2016-11-08: 3 [IU] via SUBCUTANEOUS
  Administered 2016-11-08: 2 [IU] via SUBCUTANEOUS
  Administered 2016-11-08 (×2): 3 [IU] via SUBCUTANEOUS
  Administered 2016-11-08: 5 [IU] via SUBCUTANEOUS
  Administered 2016-11-09 (×2): 3 [IU] via SUBCUTANEOUS
  Administered 2016-11-09: 2 [IU] via SUBCUTANEOUS

## 2016-11-05 MED ORDER — CLOPIDOGREL BISULFATE 300 MG PO TABS
600.0000 mg | ORAL_TABLET | Freq: Once | ORAL | Status: AC
  Administered 2016-11-05: 600 mg via ORAL
  Filled 2016-11-05: qty 2

## 2016-11-05 MED ORDER — LIDOCAINE HCL (PF) 1 % IJ SOLN
INTRAMUSCULAR | Status: AC
  Filled 2016-11-05: qty 30

## 2016-11-05 MED ORDER — IOPAMIDOL (ISOVUE-370) INJECTION 76%
INTRAVENOUS | Status: AC
  Filled 2016-11-05: qty 100

## 2016-11-05 MED ORDER — ASPIRIN 81 MG PO CHEW: 81.0000 mg | CHEWABLE_TABLET | Freq: Every day | ORAL | Status: DC

## 2016-11-05 MED ORDER — IOPAMIDOL (ISOVUE-370) INJECTION 76%
INTRAVENOUS | Status: DC | PRN
  Administered 2016-11-05: 165 mL via INTRA_ARTERIAL

## 2016-11-05 MED ORDER — SODIUM CHLORIDE 0.9 % IV SOLN: 250.0000 mL | INTRAVENOUS | Status: DC | PRN

## 2016-11-05 MED ORDER — SODIUM CHLORIDE 0.9% FLUSH: 3.0000 mL | INTRAVENOUS | Status: DC | PRN

## 2016-11-05 MED ORDER — SODIUM CHLORIDE 0.9 % IV SOLN
INTRAVENOUS | Status: DC
  Administered 2016-11-05: 09:00:00 via INTRAVENOUS

## 2016-11-05 MED ORDER — BIVALIRUDIN TRIFLUOROACETATE 250 MG IV SOLR
INTRAVENOUS | Status: AC
  Filled 2016-11-05: qty 250

## 2016-11-05 MED ORDER — ORAL CARE MOUTH RINSE
15.0000 mL | Freq: Two times a day (BID) | OROMUCOSAL | Status: DC
  Administered 2016-11-05 – 2016-11-09 (×5): 15 mL via OROMUCOSAL

## 2016-11-05 MED ORDER — HEPARIN (PORCINE) IN NACL 2-0.9 UNIT/ML-% IJ SOLN
INTRAMUSCULAR | Status: AC
  Filled 2016-11-05: qty 1000

## 2016-11-05 MED ORDER — FENTANYL CITRATE (PF) 100 MCG/2ML IJ SOLN
INTRAMUSCULAR | Status: AC
  Filled 2016-11-05: qty 2

## 2016-11-05 MED ORDER — DIAZEPAM 2 MG PO TABS
2.0000 mg | ORAL_TABLET | Freq: Four times a day (QID) | ORAL | Status: DC | PRN
  Administered 2016-11-06: 2 mg via ORAL
  Filled 2016-11-05: qty 1

## 2016-11-05 MED ORDER — LIDOCAINE HCL (PF) 1 % IJ SOLN
INTRAMUSCULAR | Status: DC | PRN
  Administered 2016-11-05: 2 mL

## 2016-11-05 MED ORDER — FENTANYL CITRATE (PF) 100 MCG/2ML IJ SOLN
INTRAMUSCULAR | Status: DC | PRN
  Administered 2016-11-05: 25 ug via INTRAVENOUS

## 2016-11-05 MED ORDER — VERAPAMIL HCL 2.5 MG/ML IV SOLN
INTRAVENOUS | Status: AC
  Filled 2016-11-05: qty 2

## 2016-11-05 MED ORDER — LABETALOL HCL 5 MG/ML IV SOLN: 10.0000 mg | INTRAVENOUS | Status: AC | PRN

## 2016-11-05 MED ORDER — CLOPIDOGREL BISULFATE 75 MG PO TABS
75.0000 mg | ORAL_TABLET | Freq: Every day | ORAL | Status: DC
  Administered 2016-11-06 – 2016-11-09 (×4): 75 mg via ORAL
  Filled 2016-11-05 (×4): qty 1

## 2016-11-05 MED ORDER — HYDRALAZINE HCL 20 MG/ML IJ SOLN: 5.0000 mg | INTRAMUSCULAR | Status: AC | PRN

## 2016-11-05 MED ORDER — BIVALIRUDIN BOLUS VIA INFUSION - CUPID
INTRAVENOUS | Status: DC | PRN
  Administered 2016-11-05: 52.5 mg via INTRAVENOUS

## 2016-11-05 MED ORDER — MIDAZOLAM HCL 2 MG/2ML IJ SOLN
INTRAMUSCULAR | Status: AC
  Filled 2016-11-05: qty 2

## 2016-11-05 SURGICAL SUPPLY — 19 items
BALLN SAPPHIRE ~~LOC~~ 2.5X12 (BALLOONS) ×1 IMPLANT
BALLN WOLVERINE 2.00X10 (BALLOONS) ×2
BALLN ~~LOC~~ EUPHORA RX 2.0X8 (BALLOONS) ×2
BALLOON WOLVERINE 2.00X10 (BALLOONS) IMPLANT
BALLOON ~~LOC~~ EUPHORA RX 2.0X8 (BALLOONS) IMPLANT
CATH LAUNCHER 6FR JR4 (CATHETERS) ×1 IMPLANT
DEVICE RAD COMP TR BAND LRG (VASCULAR PRODUCTS) ×1 IMPLANT
GLIDESHEATH SLEND SS 6F .021 (SHEATH) ×1 IMPLANT
GUIDEWIRE INQWIRE 1.5J.035X260 (WIRE) IMPLANT
INQWIRE 1.5J .035X260CM (WIRE) ×2
KIT ENCORE 26 ADVANTAGE (KITS) ×3 IMPLANT
KIT HEART LEFT (KITS) ×2 IMPLANT
PACK CARDIAC CATHETERIZATION (CUSTOM PROCEDURE TRAY) ×2 IMPLANT
STENT RESOLUTE ONYX 2.0X12 (Permanent Stent) ×1 IMPLANT
STENT RESOLUTE ONYX 2.25X15 (Permanent Stent) ×1 IMPLANT
STOPCOCK MORSE 400PSI 3WAY (MISCELLANEOUS) ×1 IMPLANT
TRANSDUCER W/STOPCOCK (MISCELLANEOUS) ×2 IMPLANT
TUBING CIL FLEX 10 FLL-RA (TUBING) ×2 IMPLANT
WIRE COUGAR XT STRL 190CM (WIRE) ×1 IMPLANT

## 2016-11-05 NOTE — Progress Notes (Signed)
Progress Note  Patient Name: Emily Miranda Date of Encounter: 11/05/2016  Primary Cardiologist:  Dr. Antoine Poche  Subjective   Chest pain last evening.  Drenching sweat last night.    Inpatient Medications    Scheduled Meds: . aspirin EC  81 mg Oral Daily  . atorvastatin  40 mg Oral q1800  . carvedilol  6.25 mg Oral BID WC  . cyclobenzaprine  5 mg Oral TID  . glimepiride  2 mg Oral Q breakfast  . heparin  5,000 Units Subcutaneous Q8H  . linagliptin  5 mg Oral Daily  . mouth rinse  15 mL Mouth Rinse BID  . pantoprazole  40 mg Oral Daily  . sodium chloride flush  3 mL Intravenous Q12H   Continuous Infusions: . sodium chloride    . sodium chloride 10 mL/hr at 11/05/16 0557  . nitroGLYCERIN 55 mcg/min (11/05/16 0600)   PRN Meds: sodium chloride, acetaminophen, morphine injection, nitroGLYCERIN, ondansetron (ZOFRAN) IV, sodium chloride flush   Vital Signs    Vitals:   11/05/16 0300 11/05/16 0400 11/05/16 0500 11/05/16 0600  BP: 127/78 120/73 124/77 129/69  Pulse: 95 94 93 93  Resp: (!) 22 (!) 22 (!) 22 19  Temp:      TempSrc:      SpO2: 99% 97% 97% 98%  Weight:      Height:        Intake/Output Summary (Last 24 hours) at 11/05/16 0751 Last data filed at 11/05/16 0600  Gross per 24 hour  Intake           122.25 ml  Output             1300 ml  Net         -1177.75 ml   Filed Weights   11/04/16 1435  Weight: 154 lb 6.4 oz (70 kg)    Telemetry    NSR - Personally Reviewed  ECG    NA - Personally Reviewed  Physical Exam   GEN: No acute distress this AM Neck: No  JVD Cardiac: rRR, 3/6 apical systolic murmurs at the apex, no diastolic murmurs, rubs, or gallops.  Respiratory:  Decreased breath sounds with bilateral basilar crackles.  GI: Soft, nontender, non-distended  MS:   Right leg swelling unchanged from yesterday. Neuro:  Nonfocal  Psych: Normal affect   Labs    Chemistry Recent Labs Lab 10/31/16 1519 11/04/16 1817 11/05/16 0523  NA  135 136 134*  K 5.0 4.1 4.8  CL 100 109 108  CO2 17* 17* 15*  GLUCOSE 247* 236* 255*  BUN 43* 26* 28*  CREATININE 1.67* 1.35* 1.40*  CALCIUM 8.7 8.2* 8.3*  GFRNONAA 30* 38* 36*  GFRAA 34* 44* 42*  ANIONGAP  --  10 11    Lab Results  Component Value Date   HGBA1C 11.3 (H) 10/06/2016    Hematology Recent Labs Lab 11/04/16 1817 11/05/16 0523  WBC 9.3 9.1  RBC 3.61* 3.32*  HGB 8.8* 8.1*  HCT 29.4* 27.1*  MCV 81.4 81.6  MCH 24.4* 24.4*  MCHC 29.9* 29.9*  RDW 18.8* 18.9*  PLT 477* 470*    Cardiac EnzymesNo results for input(s): TROPONINI in the last 168 hours. No results for input(s): TROPIPOC in the last 168 hours.   BNPNo results for input(s): BNP, PROBNP in the last 168 hours.   DDimer No results for input(s): DDIMER in the last 168 hours.   Radiology    Dg Chest [facility] 1 View  Result Date:  11/04/2016 CLINICAL DATA:  76 year old female with shortness breath. Subsequent encounter. EXAM: PORTABLE CHEST 1 VIEW COMPARISON:  11/03/2016. FINDINGS: Slightly hazy appearance of lower lung zones may represent posteriorly layering pleural fluid minimally improved from prior exam. Cardiomegaly with pulmonary vascular prominence most notable centrally. Calcified aorta. IMPRESSION: Slightly hazy appearance of lower lung zones may represent posteriorly layering pleural fluid minimally improved from prior exam. Stable cardiomegaly and central pulmonary vascular prominence. Electronically Signed   By: Lacy Duverney M.D.   On: 11/04/2016 19:36    Cardiac Studies   Echo   07/30/16  - Left ventricle: The cavity size was normal. Wall thickness was   normal. Systolic function was mildly to moderately reduced. The   estimated ejection fraction was in the range of 40% to 45%.   Akinesis of the midanteroseptal myocardium. Features are   consistent with a pseudonormal left ventricular filling pattern,   with concomitant abnormal relaxation and increased filling   pressure (grade 2 diastolic  dysfunction). - Aortic valve: Mildly to moderately calcified annulus. Mildly   thickened, mildly calcified leaflets. There was mild stenosis.   There was trivial regurgitation. Valve area (VTI): 1.31 cm^2.   Valve area (Vmax): 1.28 cm^2. Valve area (Vmean): 1.26 cm^2. - Mitral valve: There was mild regurgitation. - Left atrium: The atrium was moderately dilated. - Pericardium, extracardiac: A trivial pericardial effusion was   identified. There was a left pleural effusion.   Patient Profile     76 y.o. female with history of mild to moderate aortic stenosis, combined systolic and diastolic HF w/ EF of 40-45%, G2DD, hypertension, diabetes with chronic Rt foot wound,  CRI-3 and recent admission for NSTEMI 10/2016 where LHC showed 3V CAD with 90% stenosis in the midportion of a small rPDA, treated medically, being transferred from [facility] for recurrent unstable angina. Plan is for attempted PCI of the rPDA.   Assessment & Plan    UNSTABLE ANGINA:  Difficult situation.  I again reviewed the films and will review with Dr. Tresa Endo.  She has multiple potential culprits with RCA being most visibly significant.  Her situation is complicated by CKD and anemia and poorly controlled diabetes.   I had a long discussion with her this morning because she is high risk for any revascularization.  However, her symptoms are severe despite IV NTG.    DIABETES:  A1C as above.  She needs better BS control.  I will start SSI.  She needs close follow up as an out patient.    ANEMIA:  Hbg is reduced but increased from the lowest level last month.   She has a history of chronic anemia.     CKD STAGE III:   We are holding the Lasix today.  I will hydrate gently.  Her creat is slightly better.  She understands that there is an increased risk for renal failure with this procedure.    AS:  Mild.    PVD:  This is known previously and she has had a non healing foot ulcer.  She is to have out patient follow up.    Signed, Rollene Rotunda, MD  11/05/2016, 7:51 AM

## 2016-11-05 NOTE — Progress Notes (Signed)
Inpatient Diabetes Program Recommendations  AACE/ADA: New Consensus Statement on Inpatient Glycemic Control (2015)  Target Ranges:  Prepandial:   less than 140 mg/dL      Peak postprandial:   less than 180 mg/dL (1-2 hours)      Critically ill patients:  140 - 180 mg/dL   Results for LYNNETTE, CASTLES (MRN [id number]) as of 11/05/2016 09:05  Ref. Range 11/05/2016 08:07  Glucose-Capillary Latest Ref Range: 65 - 99 mg/dL 867 (H)    Admit with: Unstable Angina  History: DM, CHF, MI  Home DM Meds: Lantus 40 units QHS       Tradjenta 5 mg daily       Amaryl 2 mg daily  Current Insulin Orders: Tradjenta 5 mg daily          Amaryl 2 mg daily      Novolog Moderate Correction Scale/ SSI (0-15 units) Q4 hours       MD- Note patient NPO today for planned PCI at 1:30pm.  CBG this AM: 251 mg/dl.  Please consider restarting a portion of pt's home dose of Lantus insulin:  Lantus 32 units QHS (80% total home dose).  Please start tonight.      --Will follow patient during hospitalization--  Ambrose Finland RN, MSN, CDE Diabetes Coordinator Inpatient Glycemic Control Team Team Pager: [phone] (8a-5p)

## 2016-11-05 NOTE — H&P (View-Only) (Signed)
Progress Note  Patient Name: Emily Miranda Date of Encounter: 11/05/2016  Primary Cardiologist:  Dr. Antoine Poche  Subjective   Chest pain last evening.  Drenching sweat last night.    Inpatient Medications    Scheduled Meds: . aspirin EC  81 mg Oral Daily  . atorvastatin  40 mg Oral q1800  . carvedilol  6.25 mg Oral BID WC  . cyclobenzaprine  5 mg Oral TID  . glimepiride  2 mg Oral Q breakfast  . heparin  5,000 Units Subcutaneous Q8H  . linagliptin  5 mg Oral Daily  . mouth rinse  15 mL Mouth Rinse BID  . pantoprazole  40 mg Oral Daily  . sodium chloride flush  3 mL Intravenous Q12H   Continuous Infusions: . sodium chloride    . sodium chloride 10 mL/hr at 11/05/16 0557  . nitroGLYCERIN 55 mcg/min (11/05/16 0600)   PRN Meds: sodium chloride, acetaminophen, morphine injection, nitroGLYCERIN, ondansetron (ZOFRAN) IV, sodium chloride flush   Vital Signs    Vitals:   11/05/16 0300 11/05/16 0400 11/05/16 0500 11/05/16 0600  BP: 127/78 120/73 124/77 129/69  Pulse: 95 94 93 93  Resp: (!) 22 (!) 22 (!) 22 19  Temp:      TempSrc:      SpO2: 99% 97% 97% 98%  Weight:      Height:        Intake/Output Summary (Last 24 hours) at 11/05/16 0751 Last data filed at 11/05/16 0600  Gross per 24 hour  Intake           122.25 ml  Output             1300 ml  Net         -1177.75 ml   Filed Weights   11/04/16 1435  Weight: 154 lb 6.4 oz (70 kg)    Telemetry    NSR - Personally Reviewed  ECG    NA - Personally Reviewed  Physical Exam   GEN: No acute distress this AM Neck: No  JVD Cardiac: rRR, 3/6 apical systolic murmurs at the apex, no diastolic murmurs, rubs, or gallops.  Respiratory:  Decreased breath sounds with bilateral basilar crackles.  GI: Soft, nontender, non-distended  MS:   Right leg swelling unchanged from yesterday. Neuro:  Nonfocal  Psych: Normal affect   Labs    Chemistry Recent Labs Lab 10/31/16 1519 11/04/16 1817 11/05/16 0523  NA  135 136 134*  K 5.0 4.1 4.8  CL 100 109 108  CO2 17* 17* 15*  GLUCOSE 247* 236* 255*  BUN 43* 26* 28*  CREATININE 1.67* 1.35* 1.40*  CALCIUM 8.7 8.2* 8.3*  GFRNONAA 30* 38* 36*  GFRAA 34* 44* 42*  ANIONGAP  --  10 11    Lab Results  Component Value Date   HGBA1C 11.3 (H) 10/06/2016    Hematology Recent Labs Lab 11/04/16 1817 11/05/16 0523  WBC 9.3 9.1  RBC 3.61* 3.32*  HGB 8.8* 8.1*  HCT 29.4* 27.1*  MCV 81.4 81.6  MCH 24.4* 24.4*  MCHC 29.9* 29.9*  RDW 18.8* 18.9*  PLT 477* 470*    Cardiac EnzymesNo results for input(s): TROPONINI in the last 168 hours. No results for input(s): TROPIPOC in the last 168 hours.   BNPNo results for input(s): BNP, PROBNP in the last 168 hours.   DDimer No results for input(s): DDIMER in the last 168 hours.   Radiology    Dg Chest [facility] 1 View  Result Date:  11/04/2016 CLINICAL DATA:  76 year old female with shortness breath. Subsequent encounter. EXAM: PORTABLE CHEST 1 VIEW COMPARISON:  11/03/2016. FINDINGS: Slightly hazy appearance of lower lung zones may represent posteriorly layering pleural fluid minimally improved from prior exam. Cardiomegaly with pulmonary vascular prominence most notable centrally. Calcified aorta. IMPRESSION: Slightly hazy appearance of lower lung zones may represent posteriorly layering pleural fluid minimally improved from prior exam. Stable cardiomegaly and central pulmonary vascular prominence. Electronically Signed   By: Lacy Duverney M.D.   On: 11/04/2016 19:36    Cardiac Studies   Echo   07/30/16  - Left ventricle: The cavity size was normal. Wall thickness was   normal. Systolic function was mildly to moderately reduced. The   estimated ejection fraction was in the range of 40% to 45%.   Akinesis of the midanteroseptal myocardium. Features are   consistent with a pseudonormal left ventricular filling pattern,   with concomitant abnormal relaxation and increased filling   pressure (grade 2 diastolic  dysfunction). - Aortic valve: Mildly to moderately calcified annulus. Mildly   thickened, mildly calcified leaflets. There was mild stenosis.   There was trivial regurgitation. Valve area (VTI): 1.31 cm^2.   Valve area (Vmax): 1.28 cm^2. Valve area (Vmean): 1.26 cm^2. - Mitral valve: There was mild regurgitation. - Left atrium: The atrium was moderately dilated. - Pericardium, extracardiac: A trivial pericardial effusion was   identified. There was a left pleural effusion.   Patient Profile     76 y.o. female with history of mild to moderate aortic stenosis, combined systolic and diastolic HF w/ EF of 40-45%, G2DD, hypertension, diabetes with chronic Rt foot wound,  CRI-3 and recent admission for NSTEMI 10/2016 where LHC showed 3V CAD with 90% stenosis in the midportion of a small rPDA, treated medically, being transferred from [facility] for recurrent unstable angina. Plan is for attempted PCI of the rPDA.   Assessment & Plan    UNSTABLE ANGINA:  Difficult situation.  I again reviewed the films and will review with Dr. Tresa Endo.  She has multiple potential culprits with RCA being most visibly significant.  Her situation is complicated by CKD and anemia and poorly controlled diabetes.   I had a long discussion with her this morning because she is high risk for any revascularization.  However, her symptoms are severe despite IV NTG.    DIABETES:  A1C as above.  She needs better BS control.  I will start SSI.  She needs close follow up as an out patient.    ANEMIA:  Hbg is reduced but increased from the lowest level last month.   She has a history of chronic anemia.     CKD STAGE III:   We are holding the Lasix today.  I will hydrate gently.  Her creat is slightly better.  She understands that there is an increased risk for renal failure with this procedure.    AS:  Mild.    PVD:  This is known previously and she has had a non healing foot ulcer.  She is to have out patient follow up.    Signed, Rollene Rotunda, MD  11/05/2016, 7:51 AM

## 2016-11-05 NOTE — Progress Notes (Signed)
Patient started complaining about chest pain. L.Ingold, NP was notified, orders received and followed. Patient is currently comfortable, vital signs are stable. Continue to monitor for any acute changes.

## 2016-11-05 NOTE — Interval H&P Note (Signed)
Cath Lab Visit (complete for each Cath Lab visit)  Clinical Evaluation Leading to the Procedure:   ACS: No.  Non-ACS:    Anginal Classification: CCS IV  Anti-ischemic medical therapy: Maximal Therapy (2 or more classes of medications)  Non-Invasive Test Results: No non-invasive testing performed  Prior CABG: No previous CABG      History and Physical Interval Note:  11/05/2016 2:28 PM  Emily Miranda  has presented today for surgery, with the diagnosis of cad  The various methods of treatment have been discussed with the patient and family. After consideration of risks, benefits and other options for treatment, the patient has consented to  Procedure(s): Coronary Stent Intervention (N/A) as a surgical intervention .  The patient's history has been reviewed, patient examined, no change in status, stable for surgery.  I have reviewed the patient's chart and labs.  Questions were answered to the patient's satisfaction.     Nicki Guadal[place]ara

## 2016-11-06 ENCOUNTER — Telehealth: Payer: Self-pay | Admitting: Sports Medicine

## 2016-11-06 ENCOUNTER — Encounter (HOSPITAL_COMMUNITY): Payer: Self-pay | Admitting: Cardiovascular Disease

## 2016-11-06 LAB — CBC
HCT: 26.9 % — ABNORMAL LOW (ref 36.0–46.0)
Hemoglobin: 7.9 g/dL — ABNORMAL LOW (ref 12.0–15.0)
MCH: 24.4 pg — ABNORMAL LOW (ref 26.0–34.0)
MCHC: 29.4 g/dL — ABNORMAL LOW (ref 30.0–36.0)
MCV: 83 fL (ref 78.0–100.0)
Platelets: 457 10*3/uL — ABNORMAL HIGH (ref 150–400)
RBC: 3.24 MIL/uL — ABNORMAL LOW (ref 3.87–5.11)
RDW: 19 % — ABNORMAL HIGH (ref 11.5–15.5)
WBC: 8 10*3/uL (ref 4.0–10.5)

## 2016-11-06 LAB — BASIC METABOLIC PANEL
Anion gap: 6 (ref 5–15)
BUN: 23 mg/dL — ABNORMAL HIGH (ref 6–20)
CO2: 19 mmol/L — ABNORMAL LOW (ref 22–32)
Calcium: 8.5 mg/dL — ABNORMAL LOW (ref 8.9–10.3)
Chloride: 111 mmol/L (ref 101–111)
Creatinine, Ser: 1.37 mg/dL — ABNORMAL HIGH (ref 0.44–1.00)
GFR calc Af Amer: 43 mL/min — ABNORMAL LOW (ref >60)
GFR calc non Af Amer: 37 mL/min — ABNORMAL LOW (ref >60)
Glucose, Bld: 105 mg/dL — ABNORMAL HIGH (ref 65–99)
Potassium: 3.8 mmol/L (ref 3.5–5.1)
Sodium: 136 mmol/L (ref 135–145)

## 2016-11-06 LAB — TROPONIN I: Troponin I: 0.38 ng/mL (ref <0.03)

## 2016-11-06 LAB — GLUCOSE, CAPILLARY
Glucose-Capillary: 100 mg/dL — ABNORMAL HIGH (ref 65–99)
Glucose-Capillary: 121 mg/dL — ABNORMAL HIGH (ref 65–99)
Glucose-Capillary: 132 mg/dL — ABNORMAL HIGH (ref 65–99)
Glucose-Capillary: 201 mg/dL — ABNORMAL HIGH (ref 65–99)
Glucose-Capillary: 208 mg/dL — ABNORMAL HIGH (ref 65–99)
Glucose-Capillary: 224 mg/dL — ABNORMAL HIGH (ref 65–99)
Glucose-Capillary: 67 mg/dL (ref 65–99)
Glucose-Capillary: 99 mg/dL (ref 65–99)

## 2016-11-06 LAB — HEMOGLOBIN AND HEMATOCRIT, BLOOD
HCT: 28 % — ABNORMAL LOW (ref 36.0–46.0)
Hemoglobin: 8.4 g/dL — ABNORMAL LOW (ref 12.0–15.0)

## 2016-11-06 MED ORDER — ISOSORBIDE MONONITRATE ER 30 MG PO TB24
30.0000 mg | ORAL_TABLET | Freq: Every day | ORAL | Status: DC
  Administered 2016-11-06 – 2016-11-09 (×4): 30 mg via ORAL
  Filled 2016-11-06 (×4): qty 1

## 2016-11-06 MED ORDER — DEXTROSE 50 % IV SOLN
INTRAVENOUS | Status: AC
  Administered 2016-11-06: 25 mL
  Filled 2016-11-06: qty 50

## 2016-11-06 MED ORDER — FUROSEMIDE 10 MG/ML IJ SOLN
40.0000 mg | Freq: Once | INTRAMUSCULAR | Status: AC
Start: 2016-11-06 — End: 2016-11-06
  Administered 2016-11-06: 40 mg via INTRAVENOUS
  Filled 2016-11-06: qty 4

## 2016-11-06 MED FILL — Verapamil HCl IV Soln 2.5 MG/ML: INTRAVENOUS | Qty: 2 | Status: AC

## 2016-11-06 NOTE — Consult Note (Signed)
[facility] CM Inpatient Consult   11/06/2016  [place] C Dapper 10/05/1940 [id number]   Patient is currently active with [facility] for chronic disease management services.  Patient has been engaged by a RN Community Care Coordinator  Our community based plan of care has focused on disease management and community resource support. Met with the patient and husband at the bedside, with another family member. Consent on file for care management noted.  Patient states she would continue with [facility] services.  She does endorse [facility] as active prior to admission and Dr. [name] Redding II as the primary care provider.    Patient will receive a post discharge transition of care call and will be evaluated for monthly home visits for assessments and disease process education. Of note, [facility] services does not replace or interfere with any services that are needed or arranged by inpatient case management or social work.  For additional questions or referrals please contact:  Victoria Brewer, RN BSN CCM [facility] Liaison  [phone] business mobile phone Toll free office [phone]    

## 2016-11-06 NOTE — Care Management Note (Signed)
Case Management Note  Patient Details  Name: KYMANI FOULKES MRN: [id number] Date of Birth: 1940/07/05  Subjective/Objective:     Pt admitted with unstable angina               Action/Plan:   PTA from home wheelchair bound with husband.  Pt states she has been wheelchair bound for a least a year due to a non healing foot ulcer.  Pt also states that she is active with [facility] however has been recently told that she should not engage in HHPT until foot has healed.  CM will request PT eval here and will continue to follow for discharge needs   Expected Discharge Date:                  Expected Discharge Plan:     In-House Referral:     Discharge planning Services  CM Consult  Post Acute Care Choice:    Choice offered to:     DME Arranged:    DME Agency:     HH Arranged:    HH Agency:     Status of Service:  In process, will continue to follow  If discussed at Long Length of Stay Meetings, dates discussed:    Additional Comments:  Cherylann Parr, RN 11/06/2016, 4:13 PM

## 2016-11-06 NOTE — Telephone Encounter (Signed)
pts sister called and cxled appt for 6.28.18. Pt had heart surgery and is at [place]. Patient to call to reschedule.

## 2016-11-06 NOTE — Progress Notes (Signed)
Blood sugar at 0035 was 67. Tried to get patient to eat snack earlier but refused. 1/2 amp of D50 given per protocol.  Rechecked blood sugar after 20 minutes and it was 110. Patient sleeping at present time.                          Dannisha Eckmann RN

## 2016-11-06 NOTE — Progress Notes (Signed)
Paged Dr. Excell Seltzer about critical troponin of 0.38. Vital signs within normal limits.

## 2016-11-06 NOTE — Progress Notes (Signed)
Progress Note  Patient Name: Emily Miranda Date of Encounter: 11/06/2016  Primary Cardiologist: Dr Antoine Poche  Subjective   SOB, tremulous, anorexic- no chest pain overnight  Inpatient Medications    Scheduled Meds: . aspirin EC  81 mg Oral Daily  . atorvastatin  40 mg Oral q1800  . carvedilol  6.25 mg Oral BID WC  . clopidogrel  75 mg Oral Q breakfast  . cyclobenzaprine  5 mg Oral TID  . furosemide  40 mg Intravenous Once  . glimepiride  2 mg Oral Q breakfast  . heparin  5,000 Units Subcutaneous Q8H  . insulin aspart  0-15 Units Subcutaneous Q4H  . linagliptin  5 mg Oral Daily  . mouth rinse  15 mL Mouth Rinse BID  . pantoprazole  40 mg Oral Daily  . sodium chloride flush  3 mL Intravenous Q12H   Continuous Infusions: . sodium chloride Stopped (11/05/16 1658)  . sodium chloride 1,000 mL (11/06/16 0000)  . sodium chloride    . nitroGLYCERIN Stopped (11/05/16 1850)   PRN Meds: sodium chloride, acetaminophen, diazepam, morphine injection, nitroGLYCERIN, ondansetron (ZOFRAN) IV, sodium chloride flush   Vital Signs    Vitals:   11/05/16 2300 11/06/16 0330 11/06/16 0400 11/06/16 0908  BP: (!) 154/80 121/63 (!) 143/78 134/81  Pulse: 99 88 81 (!) 102  Resp: (!) 23 19 15 18   Temp: 98 F (36.7 C) 97.6 F (36.4 C)  98.4 F (36.9 C)  TempSrc: Oral Oral  Oral  SpO2: 97% 98% 99% 99%  Weight:      Height:        Intake/Output Summary (Last 24 hours) at 11/06/16 0915 Last data filed at 11/06/16 0600  Gross per 24 hour  Intake          2103.97 ml  Output             1000 ml  Net          1103.97 ml   Filed Weights   11/04/16 1435  Weight: 154 lb 6.4 oz (70 kg)    Telemetry    NST, ST- Personally Reviewed  ECG    11/06/16- NSR - Personally Reviewed  Physical Exam   GEN: Pale, tremulous, mild SOB at rest Neck: JVD noted Cardiac: RRR, 2/6 systolic murmur LSB, S3 gallop Respiratory: decreased BS 1/3 up on Lt and at Rt base- no rales GI: Soft, nontender,  non-distended  MS: trace edema- Rt foot wound appears to be healed Neuro:  Nonfocal  Psych: Normal affect   Labs    Chemistry Recent Labs Lab 11/04/16 1817 11/05/16 0523 11/06/16 0306  NA 136 134* 136  K 4.1 4.8 3.8  CL 109 108 111  CO2 17* 15* 19*  GLUCOSE 236* 255* 105*  BUN 26* 28* 23*  CREATININE 1.35* 1.40* 1.37*  CALCIUM 8.2* 8.3* 8.5*  GFRNONAA 38* 36* 37*  GFRAA 44* 42* 43*  ANIONGAP 10 11 6      Hematology Recent Labs Lab 11/04/16 1817 11/05/16 0523 11/06/16 0306  WBC 9.3 9.1 8.0  RBC 3.61* 3.32* 3.24*  HGB 8.8* 8.1* 7.9*  HCT 29.4* 27.1* 26.9*  MCV 81.4 81.6 83.0  MCH 24.4* 24.4* 24.4*  MCHC 29.9* 29.9* 29.4*  RDW 18.8* 18.9* 19.0*  PLT 477* 470* 457*    Cardiac EnzymesNo results for input(s): TROPONINI in the last 168 hours. No results for input(s): TROPIPOC in the last 168 hours.   BNPNo results for input(s): BNP, PROBNP in the last  168 hours.   DDimer No results for input(s): DDIMER in the last 168 hours.   Radiology    Dg Chest Port 1 View  Result Date: 11/04/2016 CLINICAL DATA:  76 year old female with shortness breath. Subsequent encounter. EXAM: PORTABLE CHEST 1 VIEW COMPARISON:  11/03/2016. FINDINGS: Slightly hazy appearance of lower lung zones may represent posteriorly layering pleural fluid minimally improved from prior exam. Cardiomegaly with pulmonary vascular prominence most notable centrally. Calcified aorta. IMPRESSION: Slightly hazy appearance of lower lung zones may represent posteriorly layering pleural fluid minimally improved from prior exam. Stable cardiomegaly and central pulmonary vascular prominence. Electronically Signed   By: Lacy Duverney M.D.   On: 11/04/2016 19:36    Cardiac Studies   Echo 07/30/16- Study Conclusions  - Left ventricle: The cavity size was normal. Wall thickness was   normal. Systolic function was mildly to moderately reduced. The   estimated ejection fraction was in the range of 40% to 45%.    Akinesis of the midanteroseptal myocardium. Features are   consistent with a pseudonormal left ventricular filling pattern,   with concomitant abnormal relaxation and increased filling   pressure (grade 2 diastolic dysfunction). - Aortic valve: Mildly to moderately calcified annulus. Mildly   thickened, mildly calcified leaflets. There was mild stenosis.   There was trivial regurgitation. Valve area (VTI): 1.31 cm^2.   Valve area (Vmax): 1.28 cm^2. Valve area (Vmean): 1.26 cm^2. - Mitral valve: There was mild regurgitation. - Left atrium: The atrium was moderately dilated. - Pericardium, extracardiac: A trivial pericardial effusion was   identified. There was a left pleural effusion.   Patient Profile     76 y.o. female with history of mild to moderate aortic stenosis, combined systolic and diastolic HF w/EF UJ81-19%, G2DD,hypertension, diabetes with chronic Rt foot wound, CRI-3 and recent admission for NSTEMI 10/2016 where LHC showed 3V CAD with 90% stenosis in the midportion of a small rPDA, treated medically. She was admitted as a transfer from [facility] 11/05/16 for recurrent unstable angina. She underwent successful RCA PCI 11/05/16. Lasix help pre PCI secondary to CRI, now appears to be in CHF. She is also anemic with dropping Hgb.   Assessment & Plan    Acute combined systolic and diastolic CHF- Lasix held pre PCI, I/O + 1.2L yesterday, now appears to be in CHF.  ACS/CAD- s/p successful RCA PCI 11/05/16  Anemia- Hgb down to 7.9. Transfused during her admission May 2018 for NSTEMI. She is followed at the [facility] in [place] and gets OP Aranaesp. Goal Hgb > 8.0 with her CAD  Hypertensive cardiomyopathy, with heart failure (HCC) Pt has grade 2 DD, moderate AS, moderate LVH, and EF of 40-45% by echo March 2018  DM type 2 causing CKD stage 3 (HCC) Pt had AKI during her previous admission- SCr 1.37 today  PVD (peripheral vascular disease) (HCC) Documented Rt SFA  disease by doppler in May 2018  Charcot foot due to diabetes mellitus (HCC)  Rt foot ulcer appears to be healed- followed by Dr Karie Schwalbe. [facility] in [place]  Plan: IV Lasix 40 mg now x 1 and resume daily dose PO tomorrow. Consider transfusion- will check H/H this afternoon.   Signed, [name], PA-C  11/06/2016, 9:15 AM    History and all data above reviewed.  Patient examined.  I agree with the findings as above. She is having pain currently.  However, this pain is reproducible over her sternum with palpation.  She does have some dyspnea.   The patient  exam reveals COR:RRR  ,  Lungs: Decreased breath sounds at the bases.   ,  Abd: Positive bowel sounds, no rebound no guarding, Ext No edema  .  All available labs, radiology testing, previous records reviewed. Agree with documented assessment and plan.  Chest pain:  The pain this morning is not typical and seems to be sternal pain.  I will check an EKG and cycle enzymes.  I will add Imdur.  Lasix as above.    Rollene Rotunda  9:55 AM  11/06/2016

## 2016-11-06 NOTE — Progress Notes (Signed)
CARDIAC REHAB PHASE I   Pt in bed, states she has been wheelchair/bed bound for over a year due to a R foot wound, states she had HHPT come to her house last week and they decided she was not ready for PT at that time. Pt not appropriate for ambulation with cardiac rehab at this time, however, pt may benefit from PT consult at some point to further evaluate mobility status. Completed PCI/stent education with pt and family at bedside.  Reviewed risk factors, anti-platelet therapy, stent card, activity restrictions, ntg, heart healthy and diabetes diet (pt report very poor PO intake), phase 2 cardiac rehab. Pt not appropriate for exercise guidelines at this time. Pt verbalized understanding, receptive to education. Pt agrees to phase 2 cardiac rehab referral, will send to [place], although pt is unlikely to participate due to deconditioning/functional limitations. Pt in bed, call bell within reach. Will follow.   [id number] Joylene Grapes, RN, BSN 11/06/2016 2:45 PM

## 2016-11-06 NOTE — Progress Notes (Signed)
Received call from RN regarding critical troponin of 0.38. Notes from this am reviewed. Previous labs reviewed (trop last month was higher at 0.79). Pt felt to be having aypical/sternal pain this morning. Known CAD with PCI 6/26. Review of all data doesn't suggest any acute need for further evaluation.   Tonny Bollman 11/06/2016 3:10 PM

## 2016-11-07 ENCOUNTER — Ambulatory Visit: Payer: Medicare Other | Admitting: Sports Medicine

## 2016-11-07 LAB — BASIC METABOLIC PANEL
Anion gap: 7 (ref 5–15)
BUN: 26 mg/dL — ABNORMAL HIGH (ref 6–20)
CO2: 17 mmol/L — ABNORMAL LOW (ref 22–32)
Calcium: 8.4 mg/dL — ABNORMAL LOW (ref 8.9–10.3)
Chloride: 111 mmol/L (ref 101–111)
Creatinine, Ser: 1.61 mg/dL — ABNORMAL HIGH (ref 0.44–1.00)
GFR calc Af Amer: 35 mL/min — ABNORMAL LOW (ref >60)
GFR calc non Af Amer: 30 mL/min — ABNORMAL LOW (ref >60)
Glucose, Bld: 100 mg/dL — ABNORMAL HIGH (ref 65–99)
Potassium: 4 mmol/L (ref 3.5–5.1)
Sodium: 135 mmol/L (ref 135–145)

## 2016-11-07 LAB — GLUCOSE, CAPILLARY
Glucose-Capillary: 94 mg/dL (ref 65–99)
Glucose-Capillary: 115 mg/dL — ABNORMAL HIGH (ref 65–99)
Glucose-Capillary: 154 mg/dL — ABNORMAL HIGH (ref 65–99)
Glucose-Capillary: 150 mg/dL — ABNORMAL HIGH (ref 65–99)
Glucose-Capillary: 201 mg/dL — ABNORMAL HIGH (ref 65–99)

## 2016-11-07 LAB — CBC
HCT: 29.8 % — ABNORMAL LOW (ref 36.0–46.0)
Hemoglobin: 8.8 g/dL — ABNORMAL LOW (ref 12.0–15.0)
MCH: 24.4 pg — ABNORMAL LOW (ref 26.0–34.0)
MCHC: 29.5 g/dL — ABNORMAL LOW (ref 30.0–36.0)
MCV: 82.8 fL (ref 78.0–100.0)
Platelets: 451 10*3/uL — ABNORMAL HIGH (ref 150–400)
RBC: 3.6 MIL/uL — ABNORMAL LOW (ref 3.87–5.11)
RDW: 19.1 % — ABNORMAL HIGH (ref 11.5–15.5)
WBC: 9.9 10*3/uL (ref 4.0–10.5)

## 2016-11-07 MED ORDER — FUROSEMIDE 40 MG PO TABS
40.0000 mg | ORAL_TABLET | Freq: Every day | ORAL | Status: DC
Start: 2016-11-07 — End: 2016-11-09
  Administered 2016-11-07 – 2016-11-09 (×3): 40 mg via ORAL
  Filled 2016-11-07 (×3): qty 1

## 2016-11-07 NOTE — Progress Notes (Signed)
Progress Note  Patient Name: Emily Miranda Date of Encounter: 11/07/2016  Primary Cardiologist:  Ophie Burrowes  Subjective   She is having chest pain that is reproducible with palpation.  Breathing OK.  Inpatient Medications    Scheduled Meds: . aspirin EC  81 mg Oral Daily  . atorvastatin  40 mg Oral q1800  . carvedilol  6.25 mg Oral BID WC  . clopidogrel  75 mg Oral Q breakfast  . cyclobenzaprine  5 mg Oral TID  . glimepiride  2 mg Oral Q breakfast  . heparin  5,000 Units Subcutaneous Q8H  . insulin aspart  0-15 Units Subcutaneous Q4H  . isosorbide mononitrate  30 mg Oral Daily  . linagliptin  5 mg Oral Daily  . mouth rinse  15 mL Mouth Rinse BID  . pantoprazole  40 mg Oral Daily  . sodium chloride flush  3 mL Intravenous Q12H   Continuous Infusions: . sodium chloride Stopped (11/05/16 1658)  . sodium chloride 20 mL/hr at 11/07/16 0600  . sodium chloride Stopped (11/06/16 0700)   PRN Meds: sodium chloride, acetaminophen, diazepam, morphine injection, nitroGLYCERIN, ondansetron (ZOFRAN) IV, sodium chloride flush   Vital Signs    Vitals:   11/06/16 2348 11/07/16 0300 11/07/16 0404 11/07/16 0413  BP: 113/65  116/88   Pulse:  79  85  Resp:  19  (!) 22  Temp: 98.1 F (36.7 C)  98.5 F (36.9 C)   TempSrc: Oral  Oral   SpO2:  100%  100%  Weight:      Height:        Intake/Output Summary (Last 24 hours) at 11/07/16 0746 Last data filed at 11/07/16 0600  Gross per 24 hour  Intake             3276 ml  Output              550 ml  Net             2726 ml   Filed Weights   11/04/16 1435  Weight: 154 lb 6.4 oz (70 kg)    Telemetry    NSR with atrial and ventricular ectopy.  - Personally Reviewed  ECG    NA - Personally Reviewed  Physical Exam   GEN: No acute distress.   Neck: No  JVD Cardiac: RRR, 3/6 apical systolic murmurs, rubs, or gallops.  Respiratory: Decreased breath sounds at the bases without crackles GI: Soft, nontender, non-distended    MS:  Mild (reduced) right leg edema; No deformity. Neuro:  Nonfocal  Psych: Normal affect   Labs    Chemistry Recent Labs Lab 11/05/16 0523 11/06/16 0306 11/07/16 0226  NA 134* 136 135  K 4.8 3.8 4.0  CL 108 111 111  CO2 15* 19* 17*  GLUCOSE 255* 105* 100*  BUN 28* 23* 26*  CREATININE 1.40* 1.37* 1.61*  CALCIUM 8.3* 8.5* 8.4*  GFRNONAA 36* 37* 30*  GFRAA 42* 43* 35*  ANIONGAP 11 6 7      Hematology Recent Labs Lab 11/05/16 0523 11/06/16 0306 11/06/16 1348 11/07/16 0226  WBC 9.1 8.0  --  9.9  RBC 3.32* 3.24*  --  3.60*  HGB 8.1* 7.9* 8.4* 8.8*  HCT 27.1* 26.9* 28.0* 29.8*  MCV 81.6 83.0  --  82.8  MCH 24.4* 24.4*  --  24.4*  MCHC 29.9* 29.4*  --  29.5*  RDW 18.9* 19.0*  --  19.1*  PLT 470* 457*  --  451*  Cardiac Enzymes Recent Labs Lab 11/06/16 1348  TROPONINI 0.38*   No results for input(s): TROPIPOC in the last 168 hours.   BNPNo results for input(s): BNP, PROBNP in the last 168 hours.   DDimer No results for input(s): DDIMER in the last 168 hours.   Radiology    No results found.  Cardiac Studies   Cath 6/26  Conclusion     Mid RCA lesion, 50 %stenosed.  A STENT RESOLUTE ONYX 2.0X12 drug eluting stent was successfully placed.  Ost RPDA to RPDA lesion, 95 %stenosed.  Post intervention, there is a 0% residual stenosis.  A STENT RESOLUTE ONYX 2.25X15 drug eluting stent was successfully placed.  Prox RCA to Mid RCA lesion, 70 %stenosed.  Post intervention, there is a 0% residual stenosis.  Dist RCA lesion, 40 %stenosed.   Successful percutaneous coronary intervention to a 95% proximal PDA stenosis and a small caliber vessel, treated with a [place] 2.010 mm Cutting Balloon and DES stenting with a 2.012 mm Resolute Onyx stent postdilated to 2.1 mm with the 95% stenosis being reduced to 0%.  Successful PCI to the mid RCA with Cutting Balloon atherotomy and DES stenting with a 2.2515 mm Resolute Onyx DES stent postdilated with a  2.5 noncompliant balloon and the tandem stenoses being reduced to 0%.      Patient Profile     76 y.o. female with history of mild to moderate aortic stenosis, combined systolic and diastolic HF w/EF VH84-69%, G2DD,hypertension, diabetes with chronic Rt foot wound, CRI-3 and recent admission for NSTEMI 10/2016 where LHC showed 3V CAD with 90% stenosis in the midportion of a small rPDA, treated medically. She was admitted as a transfer from [facility] 11/05/16 for recurrent unstable angina. She underwent successful RCA PCI 11/05/16. .   Assessment & Plan    UNSTABLE ANGINA:  PCI of the RCA.  She has sternal chest pain yesterday.  Troponin was elevated but lower than previous and her EKG yesterday demonstrated no acute changes.   She has had pain but it is reproducible with palpation.  I need to try to get her up this morning and moving to some degree.  I will transfer.  She is limited by her right foot.   At this point I don't think that the pain is anginal or that further cath is indicated.   ANEMIA:  Improved.  Continue to follow.   HYPERTENSIVE HEART DISEASE:  EF is mildly low.  BP is OK.  Continue current therapy.   ACUTE ON CHRONIC SYSTOLIC AND DIASTOLIC HF:  Still positive since admission.  I/O not complete secondary to problems with the Foley.  I will restart PO out patient Lasix.    DM:  BS slightly low.  Continue current therapy and encourage PO intake.  She needs better BS control as an outpatient.   CKD:  Stage III.  Creatinine is up slightly and we will follow.    PVD:  Medical management.  Leg has not changed in appearance since admission.     Signed, Rollene Rotunda, MD  11/07/2016, 7:46 AM

## 2016-11-07 NOTE — Progress Notes (Signed)
mepilex placed above gluteal fold for prophylaxis of skin integrity.

## 2016-11-07 NOTE — Telephone Encounter (Signed)
Thanks for letting me know -Dr. S 

## 2016-11-07 NOTE — Progress Notes (Signed)
Assuming care of patient after transfer from [facility] [address]. Report previously given by Herbert Seta, RN before transport. Patient is awake, alert with family at bedside. Food tray warmed up for patient. Skin assessment performed and placed patient on telemetry. NSR

## 2016-11-08 LAB — CBC
HCT: 26 % — ABNORMAL LOW (ref 36.0–46.0)
Hemoglobin: 7.5 g/dL — ABNORMAL LOW (ref 12.0–15.0)
MCH: 23.9 pg — ABNORMAL LOW (ref 26.0–34.0)
MCHC: 28.8 g/dL — ABNORMAL LOW (ref 30.0–36.0)
MCV: 82.8 fL (ref 78.0–100.0)
Platelets: 415 10*3/uL — ABNORMAL HIGH (ref 150–400)
RBC: 3.14 MIL/uL — ABNORMAL LOW (ref 3.87–5.11)
RDW: 19.4 % — ABNORMAL HIGH (ref 11.5–15.5)
WBC: 8 10*3/uL (ref 4.0–10.5)

## 2016-11-08 LAB — BASIC METABOLIC PANEL
Anion gap: 6 (ref 5–15)
BUN: 26 mg/dL — ABNORMAL HIGH (ref 6–20)
CO2: 19 mmol/L — ABNORMAL LOW (ref 22–32)
Calcium: 8.3 mg/dL — ABNORMAL LOW (ref 8.9–10.3)
Chloride: 111 mmol/L (ref 101–111)
Creatinine, Ser: 1.81 mg/dL — ABNORMAL HIGH (ref 0.44–1.00)
GFR calc Af Amer: 31 mL/min — ABNORMAL LOW (ref >60)
GFR calc non Af Amer: 26 mL/min — ABNORMAL LOW (ref >60)
Glucose, Bld: 182 mg/dL — ABNORMAL HIGH (ref 65–99)
Potassium: 3.9 mmol/L (ref 3.5–5.1)
Sodium: 136 mmol/L (ref 135–145)

## 2016-11-08 LAB — GLUCOSE, CAPILLARY
Glucose-Capillary: 144 mg/dL — ABNORMAL HIGH (ref 65–99)
Glucose-Capillary: 150 mg/dL — ABNORMAL HIGH (ref 65–99)
Glucose-Capillary: 153 mg/dL — ABNORMAL HIGH (ref 65–99)
Glucose-Capillary: 192 mg/dL — ABNORMAL HIGH (ref 65–99)
Glucose-Capillary: 199 mg/dL — ABNORMAL HIGH (ref 65–99)
Glucose-Capillary: 201 mg/dL — ABNORMAL HIGH (ref 65–99)

## 2016-11-08 MED ORDER — GI COCKTAIL ~~LOC~~
30.0000 mL | Freq: Once | ORAL | Status: AC
  Administered 2016-11-08: 30 mL via ORAL
  Filled 2016-11-08: qty 30

## 2016-11-08 NOTE — Progress Notes (Signed)
CARDIAC REHAB PHASE I   Spoke with pt yesterday, pt states she has no questions regarding education at this time. Pt still not ambulating, no order for PT, will sign off at this time.    Joylene Grapes, RN, BSN 11/08/2016 7:43 AM

## 2016-11-08 NOTE — Care Management Important Message (Signed)
Important Message  Patient Details  Name: Emily Miranda MRN: [id number] Date of Birth: [birth year]   Medicare Important Message Given:  Yes    Kyla Balzarine 11/08/2016, 10:43 AM

## 2016-11-08 NOTE — Progress Notes (Signed)
Progress Note  Patient Name: Emily Miranda Date of Encounter: 11/08/2016  Primary Cardiologist: Dr. Antoine Poche   Subjective   Feels better. No more anginal chest pain. Breathing ok.   Inpatient Medications    Scheduled Meds: . aspirin EC  81 mg Oral Daily  . atorvastatin  40 mg Oral q1800  . carvedilol  6.25 mg Oral BID WC  . clopidogrel  75 mg Oral Q breakfast  . cyclobenzaprine  5 mg Oral TID  . furosemide  40 mg Oral Daily  . glimepiride  2 mg Oral Q breakfast  . heparin  5,000 Units Subcutaneous Q8H  . insulin aspart  0-15 Units Subcutaneous Q4H  . isosorbide mononitrate  30 mg Oral Daily  . linagliptin  5 mg Oral Daily  . mouth rinse  15 mL Mouth Rinse BID  . pantoprazole  40 mg Oral Daily  . sodium chloride flush  3 mL Intravenous Q12H   Continuous Infusions: . sodium chloride Stopped (11/05/16 1658)  . sodium chloride 20 mL/hr at 11/07/16 1800  . sodium chloride Stopped (11/06/16 0700)   PRN Meds: sodium chloride, acetaminophen, diazepam, morphine injection, nitroGLYCERIN, ondansetron (ZOFRAN) IV, sodium chloride flush   Vital Signs    Vitals:   11/07/16 1716 11/07/16 1938 11/07/16 2344 11/08/16 0700  BP: 127/65 123/66 126/70 (!) 143/68  Pulse: 89 90 79 93  Resp: 17 18 19 17   Temp: 98.4 F (36.9 C) 98.1 F (36.7 C) 97.8 F (36.6 C) 97.8 F (36.6 C)  TempSrc: Oral Oral Oral Oral  SpO2: 100% 100% 100% 100%  Weight:    162 lb 6.4 oz (73.7 kg)  Height:        Intake/Output Summary (Last 24 hours) at 11/08/16 0733 Last data filed at 11/08/16 0520  Gross per 24 hour  Intake          1630.67 ml  Output              950 ml  Net           680.67 ml   Filed Weights   11/04/16 1435 11/08/16 0700  Weight: 154 lb 6.4 oz (70 kg) 162 lb 6.4 oz (73.7 kg)    Telemetry    NSR - Personally Reviewed  ECG    NSR - Personally Reviewed  Physical Exam   GEN: No acute distress.   Neck: No JVD Cardiac: RRR, 2/6 murmur at the apex, rubs, or gallops.    Respiratory: decreased BS bilaterally. GI: Soft, nontender, non-distended  MS: trace tibial edema on the right edema; right club foot Neuro:  Nonfocal  Psych: Normal affect   Labs    Chemistry Recent Labs Lab 11/06/16 0306 11/07/16 0226 11/08/16 0328  NA 136 135 136  K 3.8 4.0 3.9  CL 111 111 111  CO2 19* 17* 19*  GLUCOSE 105* 100* 182*  BUN 23* 26* 26*  CREATININE 1.37* 1.61* 1.81*  CALCIUM 8.5* 8.4* 8.3*  GFRNONAA 37* 30* 26*  GFRAA 43* 35* 31*  ANIONGAP 6 7 6      Hematology Recent Labs Lab 11/06/16 0306 11/06/16 1348 11/07/16 0226 11/08/16 0328  WBC 8.0  --  9.9 8.0  RBC 3.24*  --  3.60* 3.14*  HGB 7.9* 8.4* 8.8* 7.5*  HCT 26.9* 28.0* 29.8* 26.0*  MCV 83.0  --  82.8 82.8  MCH 24.4*  --  24.4* 23.9*  MCHC 29.4*  --  29.5* 28.8*  RDW 19.0*  --  19.1* 19.4*  PLT 457*  --  451* 415*    Cardiac Enzymes Recent Labs Lab 11/06/16 1348  TROPONINI 0.38*   No results for input(s): TROPIPOC in the last 168 hours.   BNPNo results for input(s): BNP, PROBNP in the last 168 hours.   DDimer No results for input(s): DDIMER in the last 168 hours.   Radiology    No results found.  Cardiac Studies   Cath 6/26  Conclusion     Mid RCA lesion, 50 %stenosed.  A STENT RESOLUTE ONYX 2.0X12 drug eluting stent was successfully placed.  Ost RPDA to RPDA lesion, 95 %stenosed.  Post intervention, there is a 0% residual stenosis.  A STENT RESOLUTE ONYX 2.25X15 drug eluting stent was successfully placed.  Prox RCA to Mid RCA lesion, 70 %stenosed.  Post intervention, there is a 0% residual stenosis.  Dist RCA lesion, 40 %stenosed.  Successful percutaneous coronary intervention to a 95% proximal PDA stenosis and a small caliber vessel, treated with a [place] 2.010 mm Cutting Balloon and DES stenting with a 2.012 mm Resolute Onyx stent postdilated to 2.1 mm with the 95% stenosis being reduced to 0%.  Successful PCI to the mid RCA with Cutting Balloon  atherotomy and DES stenting with a 2.2515 mm Resolute Onyx DES stent postdilated with a 2.5 noncompliant balloon and the tandem stenoses being reduced to 0%.      Patient Profile     76 y.o. female with history of mild to moderate aortic stenosis, combined systolic and diastolic HF w/EF GM01-02%, G2DD,hypertension, diabetes with chronic Rt foot wound, CRI-3 and recent admission for NSTEMI 10/2016 where LHC showed 3V CAD with 90% stenosis in the midportion of a small rPDA, treated medically. She was admitted as a transfer from [facility] 6/26/18for recurrent unstable angina. She underwent successful RCA PCI 11/05/16. .   Assessment & Plan    1. CAD/ Unstable Angina: s/p PCI to the RCA 11/06/16. No recurrent anginal symptoms.  On ASA and Plavix, BB an statin. No ACE/ARB 2/2 to CKD.  2. Chronic Combined Systolic and Diastolic HF: EF 72-53%. Volume fairly stable. Trace LEE pretibial edema noted. She is on PO Lasix. Not on ACE/ARB 2/2 CKD. Continue BB and LA nitrate. Consider addition of hydralazine.   3. DM:   Has been poorly controlled. Most recent Hgb A1c 09/2016 was 11. Blood sugars have been stable this admission. She needs close outpatient f/u with PCP.   4. CKD: stage III CKD. Scr is rising from 1.37>>1.61>>1.81. Baseline ins ~1.4. Monitor closely.   5. Anemia: drop in Hgb from 8.8 to 7.5. Monitor closely. May need transfusion if further drop <7.0.   6. PVD: Dopplers suggest SFA disease on the right. Plan is for continued medical management.   Signed, Robbie Lis, PA-C  11/08/2016, 7:33 AM     History and all data above reviewed.  Patient examined.  I agree with the findings as above.  She continues to complain of chest pain that is to some extent reproducible with palpation.  I am going to repeat a troponin to make sure it is trending down.  However, this appears to be residual from her pervious hospitalization.  At this point she is very passive in the bed and  progressively weaker.   The patient exam reveals COR:RRR  ,  Lungs: Clear  ,  Abd: Positive bowel sounds, no rebound no guarding , Ext Mild right leg edema  .  All available labs, radiology testing, previous records reviewed. Agree with  documented assessment and plan. CAD:  She had a good result on PCI of an RCA.  Follow up EKGs with ongoing pain have not demonstrated ischemia.  I will follow up troponin as above but at this point I don't think that she needs further intervention.  I do think that she needs PT.  I will consult them.  Follow creat as above.  Possibly to a NH or in patient rehab if she does not progress over the weekend to be slightly more mobile and at least able to participate in some PT.  She was very weak at home but was getting some PT.   Rollene Rotunda  11:32 AM  6/29/2018dcd

## 2016-11-08 NOTE — Care Management Note (Addendum)
Case Management Note  Patient Details  Name: Emily Miranda MRN: [id number] Date of Birth: [birth year]  Subjective/Objective: Pt presented for unstable angina- underwent successful RCA PCI 11/05/16. Pt is from home with husband and son. Pt uses a wheelchair @ home and has DME 02 via [facility] from previous visit.                    Action/Plan: Pt is currently active with [facility]- per pt she is agreeable to have services resumed once stable. Pt will need HHRN/ F2F order. Pt is enrolled in the Tele-health program. CM did call [facility] to make them aware that pt is hospitalized. No further needs from CM at this time.   Expected Discharge Date:                  Expected Discharge Plan:  Home w Home Health Services  In-House Referral:  NA  Discharge planning Services  CM Consult  Post Acute Care Choice:  Home Health, Resumption of Svcs/PTA Provider Choice offered to:  Patient, Spouse  DME Arranged:  N/A DME Agency:  NA  HH Arranged:  RN HH Agency:  [facility]  Status of Service:  Completed, signed off  If discussed at Long Length of Stay Meetings, dates discussed:    Additional Comments:  Gala Lewandowsky, RN 11/08/2016, 12:35 PM

## 2016-11-08 NOTE — Progress Notes (Signed)
PT Cancellation Note  Patient Details Name: Emily Miranda MRN: [id number] DOB: Apr 20, 1941   Cancelled Treatment:    Reason Eval/Treat Not Completed: Pain limiting ability to participate.  Pt is hurting on R foot and had chest pain awhile ago, will try to see in the AM.  Nursing is medicating the symptoms.   Ivar Drape 11/08/2016, 3:14 PM   Samul Dada, PT MS Acute Rehab Dept. Number: [facility] [id number] and [facility] [phone]

## 2016-11-08 NOTE — Plan of Care (Signed)
Problem: Pain Managment: Goal: General experience of comfort will improve Outcome: Progressing Pt still with generalized pain. PRN tylenol and scheduled flexeril effective.

## 2016-11-09 ENCOUNTER — Encounter (HOSPITAL_COMMUNITY): Payer: Self-pay | Admitting: Nurse Practitioner

## 2016-11-09 DIAGNOSIS — I2510 Atherosclerotic heart disease of native coronary artery without angina pectoris: Secondary | ICD-10-CM

## 2016-11-09 DIAGNOSIS — I5042 Chronic combined systolic (congestive) and diastolic (congestive) heart failure: Secondary | ICD-10-CM

## 2016-11-09 LAB — GLUCOSE, CAPILLARY
Glucose-Capillary: 108 mg/dL — ABNORMAL HIGH (ref 65–99)
Glucose-Capillary: 139 mg/dL — ABNORMAL HIGH (ref 65–99)
Glucose-Capillary: 160 mg/dL — ABNORMAL HIGH (ref 65–99)
Glucose-Capillary: 173 mg/dL — ABNORMAL HIGH (ref 65–99)

## 2016-11-09 LAB — BASIC METABOLIC PANEL
Anion gap: 6 (ref 5–15)
BUN: 27 mg/dL — ABNORMAL HIGH (ref 6–20)
CO2: 20 mmol/L — ABNORMAL LOW (ref 22–32)
Calcium: 8.2 mg/dL — ABNORMAL LOW (ref 8.9–10.3)
Chloride: 111 mmol/L (ref 101–111)
Creatinine, Ser: 1.7 mg/dL — ABNORMAL HIGH (ref 0.44–1.00)
GFR calc Af Amer: 33 mL/min — ABNORMAL LOW (ref >60)
GFR calc non Af Amer: 28 mL/min — ABNORMAL LOW (ref >60)
Glucose, Bld: 158 mg/dL — ABNORMAL HIGH (ref 65–99)
Potassium: 3.9 mmol/L (ref 3.5–5.1)
Sodium: 137 mmol/L (ref 135–145)

## 2016-11-09 LAB — CBC
HCT: 25.4 % — ABNORMAL LOW (ref 36.0–46.0)
Hemoglobin: 7.5 g/dL — ABNORMAL LOW (ref 12.0–15.0)
MCH: 24.4 pg — ABNORMAL LOW (ref 26.0–34.0)
MCHC: 29.5 g/dL — ABNORMAL LOW (ref 30.0–36.0)
MCV: 82.7 fL (ref 78.0–100.0)
Platelets: 396 10*3/uL (ref 150–400)
RBC: 3.07 MIL/uL — ABNORMAL LOW (ref 3.87–5.11)
RDW: 19.2 % — ABNORMAL HIGH (ref 11.5–15.5)
WBC: 8.4 10*3/uL (ref 4.0–10.5)

## 2016-11-09 MED ORDER — PANTOPRAZOLE SODIUM 40 MG PO TBEC: 40.0000 mg | DELAYED_RELEASE_TABLET | Freq: Every day | ORAL | 6 refills | Status: DC

## 2016-11-09 MED ORDER — CLOPIDOGREL BISULFATE 75 MG PO TABS: 75.0000 mg | ORAL_TABLET | Freq: Every day | ORAL | 6 refills | Status: AC

## 2016-11-09 MED ORDER — ISOSORBIDE MONONITRATE ER 30 MG PO TB24: 30.0000 mg | ORAL_TABLET | Freq: Every day | ORAL | 6 refills | Status: AC

## 2016-11-09 MED ORDER — NITROGLYCERIN 0.4 MG SL SUBL: 0.4000 mg | SUBLINGUAL_TABLET | SUBLINGUAL | 3 refills | Status: AC | PRN

## 2016-11-09 NOTE — Discharge Summary (Signed)
Discharge Summary    Patient ID: Emily Miranda,  MRN: [id number], DOB/AGE: 75-Apr-1942 75 y.o.  Admit date: 11/04/2016 Discharge date: 11/09/2016  Primary Care Provider: Arvin Collard Miranda Primary Cardiologist: Emily Commons. Hochrein, MD   Discharge Diagnoses    Principal Problem:   Unstable angina (HCC)  **s/p PCI/DES to the proximal and mid RCA disease, and RPDA.  Active Problems:   CAD in native artery   DM type 2 causing CKD stage 3 (HCC)   CKD (chronic kidney disease), stage III   Chronic anemia   Chronic combined systolic and diastolic heart failure (HCC)   Mild aortic stenosis   Chronic diabetic ulcer of right foot    Mixed hyperlipidemia   Allergies Allergies  Allergen Reactions  . Levofloxacin Nausea And Vomiting and Other (See Comments)     Confusion  . Amlodipine Other (See Comments)    Localized edema  . Gabapentin Other (See Comments)    Disoriented, no strength in legs  . Hydrochlorothiazide Other (See Comments)    Hot and disoriented  . Ibuprofen Other (See Comments)    Affected kidneys - stopped flow of urine Reaction to Advil  . Metformin And Related Other (See Comments)    Affected kidneys - stopped flow of urine  . Simvastatin Other (See Comments)    Chest pain    Diagnostic Studies/Procedures    Cardiac Catheterization and Percutaneous Coronary Intervention 6.26.2018  Coronary Findings  Dominance: Right  Right Coronary Artery  Prox RCA to Mid RCA lesion, 70% stenosed.  Angioplasty: The RCA was successfully treated using a 2.25 x 15mm Resolute Onyx DES.   There is no residual stenosis post intervention.  Mid RCA lesion, 50% stenosed.  There is no residual stenosis post intervention.  Dist RCA lesion, 40% stenosed.  Acute Marginal Branch  Vessel is small in size.  Right Posterior Descending Artery  Ost RPDA to RPDA lesion, 95% stenosed.  Angioplasty: The RPDA was successfully stented using a 2.0 x 12 mm Resolute Onyx DES.    There is no  residual stenosis post intervention.  _____________   History of Present Illness     76 y/o ? with a h/o CAD, combined CHF, HTN, DM, HL, CKD III, and chronic diabetic foot ulcer.  She was recently admitted in 09/2016 in the setting of NSTEMI.  Cath showed diffuse, multivessel disease including moderate proximal/mid RCA disease and more severe RPDA disease.  Medical therapy was recommended.  She was subsequently discharged but unfortunately, she developed recurrent chest pain, prompting readmission to [facility] on 6/24.  Decision was made to transfer her to [facility] for further evaluation and percutaneous intervention.  Hospital Course     Consultants: None  As patient failed medical therapy, she underwent diagnostic catheterization on 6/26.  The RCA and RPDA were felt to be amenable to PCI.  The proximal and mid RCA were successfully stented using a 2.25 by 15 mm Resolute Onyx DES.  The ostial RPDA was successfully stented using a 2.0 x 12 mm Resolute Onyx DES.  Post-PCI, she did have mild elevation of her troponin to 0.38.  She also complained of somewhat atypical chest pain that was reproducible with palpation.  It was not felt that this represented angina and thus no further work-up was pursued.  She was also anemic, dropping her H/H 7.5/25.4.  She has a h/o chronic anemia and was previously noted to have a negative FOB.  Decision was made to manage her  conservatively and thus she was not transfused.  She does follow-up in the [facility].  In the setting of CKD III, [name] did have mild rise of creatinine following cath/pci.  Creat peaked at 1.81 on 6/29 and is 1.70 this morning.  [name] is known to be deconditioned.  She is wheelchair bound in the setting of a chronic foot ulcer, which has since healed.  Physical therapy was ordered, however patient has refused.  At this time, she will be discharged home with resumption of home health services.  She has a follow-up  office visit on 7/6. _____________  Discharge Vitals Blood pressure 137/70, pulse 93, temperature 99.1 F (37.3 C), temperature source Oral, resp. rate 16, height 5\' 6"  (1.676 m), weight 161 lb 8 oz (73.3 kg), SpO2 97 %.  Filed Weights   11/04/16 1435 11/08/16 0700 11/09/16 0356  Weight: 154 lb 6.4 oz (70 kg) 162 lb 6.4 oz (73.7 kg) 161 lb 8 oz (73.3 kg)    Labs & Radiologic Studies    CBC  Recent Labs  11/08/16 0328 11/09/16 0231  WBC 8.0 8.4  HGB 7.5* 7.5*  HCT 26.0* 25.4*  MCV 82.8 82.7  PLT 415* 396   Basic Metabolic Panel  Recent Labs  11/08/16 0328 11/09/16 0231  NA 136 137  K 3.9 3.9  CL 111 111  CO2 19* 20*  GLUCOSE 182* 158*  BUN 26* 27*  CREATININE 1.81* 1.70*  CALCIUM 8.3* 8.2*   _____________  Dg Chest 2 View  Result Date: 10/13/2016 CLINICAL DATA:  CHF, chest tightness EXAM: CHEST  2 VIEW COMPARISON:  10/06/2016 FINDINGS: Small bilateral pleural effusions. Bibasilar atelectasis or infiltrates. Mild cardiomegaly. No acute bony abnormality. IMPRESSION: Small bilateral pleural effusions with bibasilar atelectasis or infiltrate. Electronically Signed   By: Charlett Nose M.D.   On: 10/13/2016 14:18   Dg Chest Port 1 View  Result Date: 11/04/2016 CLINICAL DATA:  76 year old female with shortness breath. Subsequent encounter. EXAM: PORTABLE CHEST 1 VIEW COMPARISON:  11/03/2016. FINDINGS: Slightly hazy appearance of lower lung zones may represent posteriorly layering pleural fluid minimally improved from prior exam. Cardiomegaly with pulmonary vascular prominence most notable centrally. Calcified aorta. IMPRESSION: Slightly hazy appearance of lower lung zones may represent posteriorly layering pleural fluid minimally improved from prior exam. Stable cardiomegaly and central pulmonary vascular prominence. Electronically Signed   By: Lacy Duverney M.D.   On: 11/04/2016 19:36   Disposition   Pt is being discharged home today in good condition.  Follow-up Plans &  Appointments    Follow-up Information    [facility] Follow up.   Specialty:  Home Health Services Why:  Registered Nurse.  Contact information: [address] [place] [address] [phone]        Abelino Derrick, PA-C Follow up on 11/15/2016.   Specialties:  Cardiology, Radiology Why:  9:00 AM - Dr. Jenene Slicker PA Contact information: [address] [place] [address] [phone]          Discharge Instructions    Amb Referral to Cardiac Rehabilitation    Complete by:  As directed    Diagnosis:  Coronary Stents Comment - To [facility]      Discharge Medications   Current Discharge Medication List    START taking these medications   Details  clopidogrel (PLAVIX) 75 MG tablet Take 1 tablet (75 mg total) by mouth daily with breakfast. Qty: 30 tablet, Refills: 6    nitroGLYCERIN (  NITROSTAT) 0.4 MG SL tablet Place 1 tablet (0.4 mg total) under the tongue every 5 (five) minutes x 3 doses as needed for chest pain. Qty: 25 tablet, Refills: 3    pantoprazole (PROTONIX) 40 MG tablet Take 1 tablet (40 mg total) by mouth daily. Qty: 30 tablet, Refills: 6      CONTINUE these medications which have CHANGED   Details  isosorbide mononitrate (IMDUR) 30 MG 24 hr tablet Take 1 tablet (30 mg total) by mouth daily. Qty: 30 tablet, Refills: 6      CONTINUE these medications which have NOT CHANGED   Details  Ascorbic Acid (VITAMIN C) 1000 MG tablet Take 1,000 mg by mouth daily.    aspirin EC 81 MG EC tablet Take 1 tablet (81 mg total) by mouth daily. Qty: 30 tablet, Refills: 0    atorvastatin (LIPITOR) 40 MG tablet Take 1 tablet (40 mg total) by mouth daily at 6 PM. Qty: 30 tablet, Refills: 3    carvedilol (COREG) 6.25 MG tablet Take 1 tablet (6.25 mg total) by mouth 2 (two) times daily with a meal. Qty: 60 tablet, Refills: 0    cyclobenzaprine (FLEXERIL) 5 MG tablet Take 1 tablet (5 mg total) by mouth 3 (three) times daily. Qty: 30  tablet, Refills: 0    fluticasone (FLONASE) 50 MCG/ACT nasal spray Place 1 spray into both nostrils daily as needed (seasonal allergies).     glimepiride (AMARYL) 2 MG tablet Take 2 mg by mouth daily with breakfast.    insulin glargine (LANTUS) 100 UNIT/ML injection Inject 0.12 mLs (12 Units total) into the skin at bedtime. Qty: 10 mL, Refills: 11    linagliptin (TRADJENTA) 5 MG TABS tablet Take 5 mg by mouth daily.    lisinopril (PRINIVIL,ZESTRIL) 30 MG tablet Take 30 mg by mouth daily.    Polyethyl Glycol-Propyl Glycol (SYSTANE OP) Place 1 drop into both eyes daily as needed (dry eyes).    polyethylene glycol (MIRALAX / GLYCOLAX) packet Take 17 g by mouth daily as needed (constipation). Mix in 8 oz liquid and drink    traMADol (ULTRAM) 50 MG tablet Take 1 tablet (50 mg total) by mouth every 8 (eight) hours as needed. Qty: 30 tablet, Refills: 0    furosemide (LASIX) 40 MG tablet Take 1 tablet (40 mg total) by mouth daily. Qty: 30 tablet, Refills: 5      STOP taking these medications     HYDROcodone-acetaminophen (NORCO) 10-325 MG tablet      omeprazole (PRILOSEC) 20 MG capsule          Aspirin prescribed at discharge?  Yes High Intensity Statin Prescribed? (Lipitor 40-80mg  or Crestor 20-40mg ): Yes Beta Blocker Prescribed? Yes For EF <40%, was ACEI/ARB Prescribed? No: N/A; CKD III prevents usage. ADP Receptor Inhibitor Prescribed? (i.e. Plavix etc.-Includes Medically Managed Patients): Yes For EF <40%, Aldosterone Inhibitor Prescribed? No: N/A Was EF assessed during THIS hospitalization? No: previously documented 3.20.2018 Was Cardiac Rehab Miranda ordered? (Included Medically managed Patients): Yes   Outstanding Labs/Studies   CBC in setting of chronic anemia.  Duration of Discharge Encounter   Greater than 30 minutes including physician time.  Signed, Nicolasa Ducking NP 11/09/2016, 2:02 PM   Attending Note:   The patient was seen and examined.  Agree with  assessment and plan as noted above.  Changes made to the above note as needed.  Patient seen and independently examined with Solon Palm.   We discussed all aspects of the encounter. I agree with the  assessment and plan as stated above.  1. CAD :  Pt is s/p PCI of his RCA . Doing well .  See progress note from same day     I have spent a total of 40 minutes with patient reviewing hospital  notes , telemetry, EKGs, labs and examining patient as well as establishing an assessment and plan that was discussed with the patient. > 50% of time was spent in direct patient care.    Vesta Mixer, Montez Hageman., MD, [facility] 11/10/2016, 4:41 PM [address],  [address] Office [phone] Pager [phone]

## 2016-11-09 NOTE — Progress Notes (Signed)
Faxed resumption of services for [facility] RN to [facility] to [phone]

## 2016-11-09 NOTE — Progress Notes (Signed)
Progress Note  Patient Name: Emily Miranda Date of Encounter: 11/09/2016  Primary Cardiologist: Hochrein  Subjective   76 yo with CAD  - s/p PCI  Very deconditioned. Has chest wall pain   Inpatient Medications    Scheduled Meds: . aspirin EC  81 mg Oral Daily  . atorvastatin  40 mg Oral q1800  . carvedilol  6.25 mg Oral BID WC  . clopidogrel  75 mg Oral Q breakfast  . cyclobenzaprine  5 mg Oral TID  . furosemide  40 mg Oral Daily  . glimepiride  2 mg Oral Q breakfast  . heparin  5,000 Units Subcutaneous Q8H  . insulin aspart  0-15 Units Subcutaneous Q4H  . isosorbide mononitrate  30 mg Oral Daily  . linagliptin  5 mg Oral Daily  . mouth rinse  15 mL Mouth Rinse BID  . pantoprazole  40 mg Oral Daily  . sodium chloride flush  3 mL Intravenous Q12H   Continuous Infusions: . sodium chloride Stopped (11/05/16 1658)  . sodium chloride 20 mL/hr at 11/07/16 1800  . sodium chloride Stopped (11/06/16 0700)   PRN Meds: sodium chloride, acetaminophen, diazepam, morphine injection, nitroGLYCERIN, ondansetron (ZOFRAN) IV, sodium chloride flush   Vital Signs    Vitals:   11/08/16 0700 11/08/16 1720 11/08/16 1959 11/09/16 0356  BP: (!) 143/68 137/67 (!) 144/70 137/70  Pulse: 93 77 79 93  Resp: 17  15 16   Temp: 97.8 F (36.6 C)  98 F (36.7 C) 99.1 F (37.3 C)  TempSrc: Oral  Oral Oral  SpO2: 100% 99% 99% 97%  Weight: 162 lb 6.4 oz (73.7 kg)   161 lb 8 oz (73.3 kg)  Height:        Intake/Output Summary (Last 24 hours) at 11/09/16 1205 Last data filed at 11/09/16 0850  Gross per 24 hour  Intake              480 ml  Output             1000 ml  Net             -520 ml   Filed Weights   11/04/16 1435 11/08/16 0700 11/09/16 0356  Weight: 154 lb 6.4 oz (70 kg) 162 lb 6.4 oz (73.7 kg) 161 lb 8 oz (73.3 kg)    Telemetry    NSR - Personally Reviewed  ECG     NSR  - Personally Reviewed  Physical Exam   GEN: No acute distress.   Neck: No JVD Cardiac: RRR, no  murmurs, rubs, or gallops. Chest wall pain ,  No angina Respiratory: Clear to auscultation bilaterally. GI: Soft, nontender, non-distended  MS: No edema; No deformity. Neuro:  Nonfocal  Psych: Normal affect   Labs    Chemistry Recent Labs Lab 11/07/16 0226 11/08/16 0328 11/09/16 0231  NA 135 136 137  K 4.0 3.9 3.9  CL 111 111 111  CO2 17* 19* 20*  GLUCOSE 100* 182* 158*  BUN 26* 26* 27*  CREATININE 1.61* 1.81* 1.70*  CALCIUM 8.4* 8.3* 8.2*  GFRNONAA 30* 26* 28*  GFRAA 35* 31* 33*  ANIONGAP 7 6 6      Hematology Recent Labs Lab 11/07/16 0226 11/08/16 0328 11/09/16 0231  WBC 9.9 8.0 8.4  RBC 3.60* 3.14* 3.07*  HGB 8.8* 7.5* 7.5*  HCT 29.8* 26.0* 25.4*  MCV 82.8 82.8 82.7  MCH 24.4* 23.9* 24.4*  MCHC 29.5* 28.8* 29.5*  RDW 19.1* 19.4* 19.2*  PLT 451*  415* 396    Cardiac Enzymes Recent Labs Lab 11/06/16 1348  TROPONINI 0.38*   No results for input(s): TROPIPOC in the last 168 hours.   BNPNo results for input(s): BNP, PROBNP in the last 168 hours.   DDimer No results for input(s): DDIMER in the last 168 hours.   Radiology    No results found.  Cardiac Studies      Patient Profile     76 y.o. female with PCI  Assessment & Plan    1. CAD:   S/p PCI Doing well.   Has chest wall tenderness.  No angina pain  Ok for DC today  Plavix and ASA for 1 year   Follow up with Dr. Antoine Poche  Or APP in several weeks  Signed, Kristeen Miss, MD  11/09/2016, 12:05 PM

## 2016-11-09 NOTE — Discharge Instructions (Signed)
**  PLEASE REMEMBER TO BRING ALL OF YOUR MEDICATIONS TO EACH OF YOUR FOLLOW-UP OFFICE VISITS. ° °NO HEAVY LIFTING OR SEXUAL ACTIVITY X 7 DAYS. °NO DRIVING X 3-5 DAYS. °NO SOAKING BATHS, HOT TUBS, POOLS, ETC., X 7 DAYS. ° °Radial Site Care °Refer to this sheet in the next few weeks. These instructions provide you with information on caring for yourself after your procedure. Your caregiver may also give you more specific instructions. Your treatment has been planned according to current medical practices, but problems sometimes occur. Call your caregiver if you have any problems or questions after your procedure. °HOME CARE INSTRUCTIONS °You may shower the day after the procedure. Remove the bandage (dressing) and gently wash the site with plain soap and water. Gently pat the site dry.  °Do not apply powder or lotion to the site.  °Do not submerge the affected site in water for 3 to 5 days.  °Inspect the site at least twice daily.  °Do not flex or bend the affected arm for 24 hours.  °No lifting over 5 pounds (2.3 kg) for 5 days after your procedure.  °Do not drive home if you are discharged the same day of the procedure. Have someone else drive you.  ° °What to expect: °Any bruising will usually fade within 1 to 2 weeks.  °Blood that collects in the tissue (hematoma) may be painful to the touch. It should usually decrease in size and tenderness within 1 to 2 weeks.  °SEEK IMMEDIATE MEDICAL CARE IF: °You have unusual pain at the radial site.  °You have redness, warmth, swelling, or pain at the radial site.  °You have drainage (other than a small amount of blood on the dressing).  °You have chills.  °You have a fever or persistent symptoms for more than 72 hours.  °You have a fever and your symptoms suddenly get worse.  °Your arm becomes pale, cool, tingly, or numb.  °You have heavy bleeding from the site. Hold pressure on the site.  °_____________  ° °  ° °10 Habits of Highly Healthy People ° °[place] wants to help  you get well and stay well.  Live a longer, healthier life by practicing healthy habits every day. ° °1.  Visit your primary care provider regularly. °2.  Make time for family and friends.  Healthy relationships are important. °3.  Take medications as directed by your provider. °4.  Maintain a healthy weight and a trim waistline. °5.  Eat healthy meals and snacks, rich in fruits, vegetables, whole grains, and lean proteins. °6.  Get moving every day - aim for 150 minutes of moderate physical activity each week. °7.  Don't smoke. °8.  Avoid alcohol or drink in moderation. °9.  Manage stress through meditation or mindful relaxation. °10.  Get seven to nine hours of quality sleep each night. ° °Want more information on healthy habits?  To learn more about these and other healthy habits, visit [email or web address]. °_____________ °   °

## 2016-11-09 NOTE — Progress Notes (Signed)
PT Cancellation Note  Patient Details Name: Emily Miranda MRN: [id number] DOB: [birth year]   Cancelled Treatment:    Reason Eval/Treat Not Completed: Other (comment).  Pt declined based on her assertion she should not stand on the RLE, that she is NWB and not permitted.  Has been up in chair yesterday when PT arrived, declined to even try to get up bedside.  Will try later as time and pt allow.   Ivar Drape 11/09/2016, 10:07 AM   Samul Dada, PT MS Acute Rehab Dept. Number: [facility] [id number] and [facility] [phone]

## 2016-11-11 ENCOUNTER — Telehealth: Payer: Self-pay | Admitting: Cardiology

## 2016-11-11 DIAGNOSIS — I214 Non-ST elevation (NSTEMI) myocardial infarction: Secondary | ICD-10-CM | POA: Diagnosis not present

## 2016-11-11 DIAGNOSIS — Z7951 Long term (current) use of inhaled steroids: Secondary | ICD-10-CM | POA: Diagnosis not present

## 2016-11-11 DIAGNOSIS — Z794 Long term (current) use of insulin: Secondary | ICD-10-CM | POA: Diagnosis not present

## 2016-11-11 DIAGNOSIS — E1142 Type 2 diabetes mellitus with diabetic polyneuropathy: Secondary | ICD-10-CM | POA: Diagnosis not present

## 2016-11-11 DIAGNOSIS — I350 Nonrheumatic aortic (valve) stenosis: Secondary | ICD-10-CM | POA: Diagnosis not present

## 2016-11-11 DIAGNOSIS — I252 Old myocardial infarction: Secondary | ICD-10-CM | POA: Diagnosis not present

## 2016-11-11 DIAGNOSIS — D631 Anemia in chronic kidney disease: Secondary | ICD-10-CM | POA: Diagnosis not present

## 2016-11-11 DIAGNOSIS — I130 Hypertensive heart and chronic kidney disease with heart failure and stage 1 through stage 4 chronic kidney disease, or unspecified chronic kidney disease: Secondary | ICD-10-CM | POA: Diagnosis not present

## 2016-11-11 DIAGNOSIS — N183 Chronic kidney disease, stage 3 (moderate): Secondary | ICD-10-CM | POA: Diagnosis not present

## 2016-11-11 DIAGNOSIS — Z48812 Encounter for surgical aftercare following surgery on the circulatory system: Secondary | ICD-10-CM | POA: Diagnosis not present

## 2016-11-11 DIAGNOSIS — I2510 Atherosclerotic heart disease of native coronary artery without angina pectoris: Secondary | ICD-10-CM | POA: Diagnosis not present

## 2016-11-11 DIAGNOSIS — E11610 Type 2 diabetes mellitus with diabetic neuropathic arthropathy: Secondary | ICD-10-CM | POA: Diagnosis not present

## 2016-11-11 DIAGNOSIS — Z8631 Personal history of diabetic foot ulcer: Secondary | ICD-10-CM | POA: Diagnosis not present

## 2016-11-11 DIAGNOSIS — E1151 Type 2 diabetes mellitus with diabetic peripheral angiopathy without gangrene: Secondary | ICD-10-CM | POA: Diagnosis not present

## 2016-11-11 DIAGNOSIS — E1122 Type 2 diabetes mellitus with diabetic chronic kidney disease: Secondary | ICD-10-CM | POA: Diagnosis not present

## 2016-11-11 DIAGNOSIS — I5042 Chronic combined systolic (congestive) and diastolic (congestive) heart failure: Secondary | ICD-10-CM | POA: Diagnosis not present

## 2016-11-11 DIAGNOSIS — Z7982 Long term (current) use of aspirin: Secondary | ICD-10-CM | POA: Diagnosis not present

## 2016-11-11 NOTE — Telephone Encounter (Signed)
New message    Rosey Bath, RN from [facility] is calling for Home Health orders for this pt.

## 2016-11-11 NOTE — Telephone Encounter (Signed)
LM for Marchelle Folks that per 11/09/16 hospital discharge summary, patient is to take furosemide 40mg  PO QD.

## 2016-11-11 NOTE — Telephone Encounter (Signed)
Returned call to [employer] They need home health nursing orders for patient Requesting: -- 2 visit per week for 2 weeks -- 1 visit per week for 3 weeks  Advised would defer to MD. OK to call her w/orders if MD OK's them

## 2016-11-11 NOTE — Telephone Encounter (Signed)
Marchelle Folks is calling to get clarification on the instructions about mrs. Woldt Lasix. Discharge orders say 40mg  once a day ,and patient states she was told not to take it. Please call .Marland Kitchen Thanks

## 2016-11-12 ENCOUNTER — Telehealth: Payer: Self-pay

## 2016-11-12 ENCOUNTER — Other Ambulatory Visit: Payer: Self-pay

## 2016-11-12 NOTE — Patient Outreach (Signed)
Care Coordination: Placed follow up call to patient about lasix. Patient was asleep so I spoke with husband Jonny Ruiz. Mr. Lowdermilk state patient is feels some better today and her bowels moved.   Reviewed recommendation from MD to take the lasix as prescribed.  Husband voiced understanding and was able to verbally return correct instructions to me via phone and states that he will make sure patient takes her fluid pill.  PLAN: reviewed with husband pending home visit on 11/21/2016  Rowe Pavy, RN, BSN, CEN [facility] Care Coordinator [phone]

## 2016-11-12 NOTE — Telephone Encounter (Signed)
OK to order as requested.

## 2016-11-12 NOTE — Telephone Encounter (Signed)
-----   Message from Ok Anis, NP sent at 11/09/2016  1:20 PM EDT ----- Nicola Police,  This pt is going home today, 6/30, and is scheduled to see [name] on 7/6 for a 7 day transition of care - s/p nstemi and stenting.  Just needs a phone call.  Thanks,  Thayer Ohm

## 2016-11-12 NOTE — Telephone Encounter (Signed)
Patient contacted regarding discharge from [facility] on 11/09/2016. Patient understands to follow up with provider [name] on 11/18/2016 at 1530 at [facility] office. Patient understands discharge instructions? yes Patient understands medications and regiment? Yes-Pt states she has filled prescription for plavix and is taking without issue. Patient understands to bring all medications to this visit? yes  Call placed to Pt for TCM follow up.  Pt states she's "ok", feels like she "can't get strength back".  Pt states she has had some central chest pain, but she attributes that pain to her pancreatitis.  Encouraged Pt to stay out of heat and take frequent rest breaks throughout the day.  Pt states HH services have been out to see her.  Pt changed f/u appt to 11/18/2016 for appt later in day.  Pt denies any educational needs.

## 2016-11-12 NOTE — Patient Outreach (Addendum)
Transition of care call on 11/11/2016 [facility]mission[facility]transfer [facility] date of 12/04/2016 Discharged home 12/09/2016  Placed call to patient who reports that her legs are weak. Reports that her son and husband are assisting her with ADLS.   Patient reports that she is wheelchair bound despite foot ulcer is healed. Patient reports that she has refused PT in the past. Patient states that her legs are weak.   FBP:ZWCHENID follow up with patient and she is aware that she has a cardiology appointment on 11/15/2016.  States that she has not made a follow up appointment with primary MD. Encouraged to do so.  Patient reports that she was told not to take her lasix at discharge however this medications is on her discharge list. Placed call to cardiology and I received a call back which states that patient is supposed to take her Lasix.  I called patient back to inform with no answer and no machine.   DM:  Reports last night (7/1) CBG was 163. Reports that she only monitors at night.   Home Health:  Patient reports to me that she has not yet heard from home health. Placed call to [facility] and confirmed patient is on schedule to be seen on 11/12/2016.  Offered home visit and patient very hesitant but agreed to home visit on 11/21/2016.  PLAN: will continue transition of care. Will attempt again to notify patient again of need to take lasix.  Home visit planned for 11/21/2016.  Outpatient Encounter Prescriptions as of 11/12/2016  Medication Sig Note  . Ascorbic Acid (VITAMIN C) 1000 MG tablet Take 1,000 mg by mouth daily.   Marland Kitchen aspirin EC 81 MG EC tablet Take 1 tablet (81 mg total) by mouth daily.   Marland Kitchen atorvastatin (LIPITOR) 40 MG tablet Take 1 tablet (40 mg total) by mouth daily at 6 PM.   . carvedilol (COREG) 6.25 MG tablet Take 1 tablet (6.25 mg total) by mouth 2 (two) times daily with a meal. (Patient taking differently: Take 6.25 mg by mouth 2 (two) times daily as needed (chest pain/ shortness of breath). )  11/04/2016: Prescribed twice daily but pt states she decided she did not need this every day  . clopidogrel (PLAVIX) 75 MG tablet Take 1 tablet (75 mg total) by mouth daily with breakfast.   . cyclobenzaprine (FLEXERIL) 5 MG tablet Take 1 tablet (5 mg total) by mouth 3 (three) times daily. (Patient taking differently: Take 5 mg by mouth 3 (three) times daily as needed for muscle spasms. )   . fluticasone (FLONASE) 50 MCG/ACT nasal spray Place 1 spray into both nostrils daily as needed (seasonal allergies).    Marland Kitchen glimepiride (AMARYL) 2 MG tablet Take 2 mg by mouth daily with breakfast.   . insulin glargine (LANTUS) 100 UNIT/ML injection Inject 0.12 mLs (12 Units total) into the skin at bedtime. (Patient taking differently: Inject 40 Units into the skin at bedtime. ) 11/04/2016: Pt has Solostar pen  . isosorbide mononitrate (IMDUR) 30 MG 24 hr tablet Take 1 tablet (30 mg total) by mouth daily.   Marland Kitchen linagliptin (TRADJENTA) 5 MG TABS tablet Take 5 mg by mouth daily.   Marland Kitchen lisinopril (PRINIVIL,ZESTRIL) 30 MG tablet Take 30 mg by mouth daily.   . nitroGLYCERIN (NITROSTAT) 0.4 MG SL tablet Place 1 tablet (0.4 mg total) under the tongue every 5 (five) minutes x 3 doses as needed for chest pain.   . pantoprazole (PROTONIX) 40 MG tablet Take 1 tablet (40 mg total)  by mouth daily.   Bertram Gala Glycol-Propyl Glycol (SYSTANE OP) Place 1 drop into both eyes daily as needed (dry eyes).   . polyethylene glycol (MIRALAX / GLYCOLAX) packet Take 17 g by mouth daily as needed (constipation). Mix in 8 oz liquid and drink   . traMADol (ULTRAM) 50 MG tablet Take 1 tablet (50 mg total) by mouth every 8 (eight) hours as needed. (Patient taking differently: Take 50 mg by mouth every 4 (four) hours as needed (pain). )   . furosemide (LASIX) 40 MG tablet Take 1 tablet (40 mg total) by mouth daily. (Patient not taking: Reported on 11/12/2016) 11/04/2016: On hold per Dr. Jeanie Sewer due to frequent urination   No facility-administered  encounter medications on file as of 11/12/2016.     THN CM Care Plan Problem One     Most Recent Value  Care Plan Problem One  Hospital admission related to MI and heart failure   Role Documenting the Problem One  Care Management Coordinator  Care Plan for Problem One  Active  THN Long Term Goal   Patient will not experience a hospital readmission in the next 60 days   THN Long Term Goal Start Date  11/11/16 Dorna Bloom restarted due to readmission]  Interventions for Problem One Long Term Goal  Reviewed importance of early recongiton of change in condition. Encouraged patient to call MD for weight gain.  Home visit planned. Cardiology called about lasix dose nad home health call about home visits.  THN CM Short Term Goal #1   Patient will attend all medical appointments in the next 30 days   THN CM Short Term Goal #1 Start Date  11/11/16 Dorna Bloom restarted due to a readmission]  Interventions for Short Term Goal #1  Advised patient regarding importance of scheduling  PCP post hospital visit and keeping all specialist visits,    THN CM Short Term Goal #2   Patient will continue to weigh daily and keep a record in the next 30 days   THN CM Short Term Goal #2 Start Date  11/11/16 Dorna Bloom restarted due to a readmission]  Interventions for Short Term Goal #2  Reviewed CHF zones and when to call MD.      This note and barrier letter sent to MD. Rowe Pavy, RN, BSN, CEN [facility] Care Coordinator [phone]

## 2016-11-12 NOTE — Telephone Encounter (Signed)
Left detailed message on secure voicemail - per Dr Antoine Poche. Okay for home health.

## 2016-11-14 DIAGNOSIS — E1151 Type 2 diabetes mellitus with diabetic peripheral angiopathy without gangrene: Secondary | ICD-10-CM | POA: Diagnosis not present

## 2016-11-14 DIAGNOSIS — Z48812 Encounter for surgical aftercare following surgery on the circulatory system: Secondary | ICD-10-CM | POA: Diagnosis not present

## 2016-11-14 DIAGNOSIS — Z7982 Long term (current) use of aspirin: Secondary | ICD-10-CM | POA: Diagnosis not present

## 2016-11-14 DIAGNOSIS — Z794 Long term (current) use of insulin: Secondary | ICD-10-CM | POA: Diagnosis not present

## 2016-11-14 DIAGNOSIS — N183 Chronic kidney disease, stage 3 (moderate): Secondary | ICD-10-CM | POA: Diagnosis not present

## 2016-11-14 DIAGNOSIS — I214 Non-ST elevation (NSTEMI) myocardial infarction: Secondary | ICD-10-CM | POA: Diagnosis not present

## 2016-11-14 DIAGNOSIS — Z7951 Long term (current) use of inhaled steroids: Secondary | ICD-10-CM | POA: Diagnosis not present

## 2016-11-14 DIAGNOSIS — I5042 Chronic combined systolic (congestive) and diastolic (congestive) heart failure: Secondary | ICD-10-CM | POA: Diagnosis not present

## 2016-11-14 DIAGNOSIS — I130 Hypertensive heart and chronic kidney disease with heart failure and stage 1 through stage 4 chronic kidney disease, or unspecified chronic kidney disease: Secondary | ICD-10-CM | POA: Diagnosis not present

## 2016-11-14 DIAGNOSIS — E11610 Type 2 diabetes mellitus with diabetic neuropathic arthropathy: Secondary | ICD-10-CM | POA: Diagnosis not present

## 2016-11-14 DIAGNOSIS — I2510 Atherosclerotic heart disease of native coronary artery without angina pectoris: Secondary | ICD-10-CM | POA: Diagnosis not present

## 2016-11-14 DIAGNOSIS — E1122 Type 2 diabetes mellitus with diabetic chronic kidney disease: Secondary | ICD-10-CM | POA: Diagnosis not present

## 2016-11-14 DIAGNOSIS — D631 Anemia in chronic kidney disease: Secondary | ICD-10-CM | POA: Diagnosis not present

## 2016-11-14 DIAGNOSIS — E1142 Type 2 diabetes mellitus with diabetic polyneuropathy: Secondary | ICD-10-CM | POA: Diagnosis not present

## 2016-11-14 DIAGNOSIS — I350 Nonrheumatic aortic (valve) stenosis: Secondary | ICD-10-CM | POA: Diagnosis not present

## 2016-11-14 DIAGNOSIS — Z8631 Personal history of diabetic foot ulcer: Secondary | ICD-10-CM | POA: Diagnosis not present

## 2016-11-14 DIAGNOSIS — I252 Old myocardial infarction: Secondary | ICD-10-CM | POA: Diagnosis not present

## 2016-11-15 ENCOUNTER — Ambulatory Visit: Payer: Medicare Other | Admitting: Physician Assistant

## 2016-11-15 ENCOUNTER — Ambulatory Visit: Payer: Medicare Other | Admitting: Cardiology

## 2016-11-18 ENCOUNTER — Ambulatory Visit (INDEPENDENT_AMBULATORY_CARE_PROVIDER_SITE_OTHER): Payer: Medicare Other | Admitting: Cardiology

## 2016-11-18 ENCOUNTER — Encounter: Payer: Self-pay | Admitting: Cardiology

## 2016-11-18 VITALS — BP 141/70 | HR 80 | Ht 66.0 in | Wt 157.4 lb

## 2016-11-18 DIAGNOSIS — D649 Anemia, unspecified: Secondary | ICD-10-CM | POA: Diagnosis not present

## 2016-11-18 DIAGNOSIS — E11610 Type 2 diabetes mellitus with diabetic neuropathic arthropathy: Secondary | ICD-10-CM

## 2016-11-18 DIAGNOSIS — Z79899 Other long term (current) drug therapy: Secondary | ICD-10-CM

## 2016-11-18 DIAGNOSIS — I110 Hypertensive heart disease with heart failure: Secondary | ICD-10-CM | POA: Diagnosis not present

## 2016-11-18 DIAGNOSIS — N183 Chronic kidney disease, stage 3 unspecified: Secondary | ICD-10-CM

## 2016-11-18 DIAGNOSIS — Z9861 Coronary angioplasty status: Secondary | ICD-10-CM | POA: Diagnosis not present

## 2016-11-18 DIAGNOSIS — I5041 Acute combined systolic (congestive) and diastolic (congestive) heart failure: Secondary | ICD-10-CM | POA: Diagnosis not present

## 2016-11-18 DIAGNOSIS — I2510 Atherosclerotic heart disease of native coronary artery without angina pectoris: Secondary | ICD-10-CM

## 2016-11-18 DIAGNOSIS — I43 Cardiomyopathy in diseases classified elsewhere: Secondary | ICD-10-CM

## 2016-11-18 MED ORDER — METOLAZONE 2.5 MG PO TABS: ORAL_TABLET | ORAL | 3 refills | Status: DC

## 2016-11-18 NOTE — Assessment & Plan Note (Signed)
Pt's main complaint to day is bilateral LE edema. Her wgt 10/31/16 was 144 lbs- wgt today 157 lbs.

## 2016-11-18 NOTE — Patient Instructions (Addendum)
Medication Instructions: START Metolazone 2.5 mg tablet. Take the Metolazone, one 2.5 mg tablet, for 3 days in a row starting tonight. Then take one tablet, 2.5 mg, on Monday, Wednesday, and Friday. Please take the Metolazone 30 minutes prior to taking the Furosemide (Lasix)   Your physician recommends that you return for lab work in 10 days for the following labs: BMET and CBC   Follow-Up: Your physician recommends that you schedule a follow-up appointment in: 10 days with Dr. Antoine Poche or an APP.   If you need a refill on your cardiac medications before your next appointment, please call your pharmacy.

## 2016-11-18 NOTE — Assessment & Plan Note (Signed)
Transfused during her admission may 2018 for NSTEMI. She is followed at the [facility] in [place] and gets OP Aranaesp

## 2016-11-18 NOTE — Assessment & Plan Note (Signed)
Moderate 3V disease at cath 10/11/16- medical Rx Recurrent chest pain-- RCA PCI with DES 11/07/16

## 2016-11-18 NOTE — Assessment & Plan Note (Signed)
Pt has grade 2 DD, moderate AS, moderate LVH, and EF of 40-45% 

## 2016-11-18 NOTE — Assessment & Plan Note (Signed)
With slow healing ulcer Rt foot- followed by Dr T. Stover in [place] 

## 2016-11-18 NOTE — Progress Notes (Signed)
11/18/2016 Emily Miranda   [birth year]  [id number]  Primary Physician Noni Saupe, MD Primary Cardiologist: Dr Antoine Poche  HPI:   76 y.o.femalewith history of mild to moderate aortic stenosis, EF 40-45%,  HCVD-grade 2 DD, IDDM with chronic Rt foot wound, and CRI-3. She was admitted as a transfer from [facility] 10/06/16 with a NSTEMI. Her course was complicated by CHF requiring several days diuresis (her cath was delayed secondary to persistent CHF), anemia requiring transfusion, and acute on CRI.  Her enzymes are low and flat. She ultimately underwent cath 10/11/16 which showed moderate, diffuse 3v disease. The plan was for medical therapy. She was seen in follow up 10/31/16. Mobility was a big problem, her Rt foot was unusable and she couldn't walk with PT. She has know Rt SFA disease but the benefit  of intervention vs risk was not clear as her Rt foot ulcer was healing.   She then presented to [facility] 11/04/16 with chest pain and positive Troponin. It was decided to re study her and on 11/07/16 she underwent intervention to two sites in her RCA by Dr Tresa Endo. She had chest pain post PCI but her Troponin was flat and no further intervention was planned. She was discharged 11/09/16 and is seen today for follow up. Her main compliant today is swelling in both legs. I reviewed her chart, her wgt was 144 lbs on 10/31/16- 154 lbs today. It was even higher in the hospital- 161 lbs. She did have volume overload during her hospitalization and though her Lasix was resumed she appears to still be volume overloaded.    Current Outpatient Prescriptions  Medication Sig Dispense Refill  . Ascorbic Acid (VITAMIN C) 1000 MG tablet Take 1,000 mg by mouth daily.    Marland Kitchen aspirin EC 81 MG EC tablet Take 1 tablet (81 mg total) by mouth daily. 30 tablet 0  . atorvastatin (LIPITOR) 40 MG tablet Take 1 tablet (40 mg total) by mouth daily at 6 PM. 30 tablet 3  . carvedilol (COREG) 6.25 MG tablet Take 6.25 mg by mouth 2 (two) times  daily with a meal.    . clopidogrel (PLAVIX) 75 MG tablet Take 1 tablet (75 mg total) by mouth daily with breakfast. 30 tablet 6  . fluticasone (FLONASE) 50 MCG/ACT nasal spray Place 1 spray into both nostrils daily as needed (seasonal allergies).     . furosemide (LASIX) 40 MG tablet Take 40 mg by mouth daily.    Marland Kitchen glimepiride (AMARYL) 2 MG tablet Take 2 mg by mouth daily with breakfast.    . insulin glargine (LANTUS) 100 UNIT/ML injection Inject 0.12 mLs (12 Units total) into the skin at bedtime. (Patient taking differently: Inject 40 Units into the skin at bedtime. ) 10 mL 11  . isosorbide mononitrate (IMDUR) 30 MG 24 hr tablet Take 1 tablet (30 mg total) by mouth daily. 30 tablet 6  . linagliptin (TRADJENTA) 5 MG TABS tablet Take 5 mg by mouth daily.    Marland Kitchen lisinopril (PRINIVIL,ZESTRIL) 30 MG tablet Take 30 mg by mouth daily.    . nitroGLYCERIN (NITROSTAT) 0.4 MG SL tablet Place 1 tablet (0.4 mg total) under the tongue every 5 (five) minutes x 3 doses as needed for chest pain. 25 tablet 3  . pantoprazole (PROTONIX) 40 MG tablet Take 1 tablet (40 mg total) by mouth daily. 30 tablet 6  . Polyethyl Glycol-Propyl Glycol (SYSTANE OP) Place 1 drop into both eyes daily as needed (dry eyes).    Marland Kitchen  polyethylene glycol (MIRALAX / GLYCOLAX) packet Take 17 g by mouth daily as needed (constipation). Mix in 8 oz liquid and drink    . traMADol (ULTRAM) 50 MG tablet Take 1 tablet (50 mg total) by mouth every 8 (eight) hours as needed. (Patient taking differently: Take 50 mg by mouth every 4 (four) hours as needed (pain). ) 30 tablet 0  . metolazone (ZAROXOLYN) 2.5 MG tablet Take Mon, Wed, and Fri 30 mins prior to Lasix or as directed. 15 tablet 3   No current facility-administered medications for this visit.     Allergies  Allergen Reactions  . Levofloxacin Nausea And Vomiting and Other (See Comments)     Confusion  . Amlodipine Other (See Comments)    Localized edema  . Gabapentin Other (See Comments)     Disoriented, no strength in legs  . Hydrochlorothiazide Other (See Comments)    Hot and disoriented  . Ibuprofen Other (See Comments)    Affected kidneys - stopped flow of urine Reaction to Advil  . Metformin And Related Other (See Comments)    Affected kidneys - stopped flow of urine  . Simvastatin Other (See Comments)    Chest pain    Past Medical History:  Diagnosis Date  . Aortic stenosis, mild   . CAD (coronary artery disease)    a. 09/2016 NSTEMI/Cath: RCA 70p/m, RPDA 95ost-->Med Rx;  b. 10/2016 PCI: RCA 70p/m (2.25 x 15 Resolute Onyx DES), RPDA 95ost (2.0 x 12 Resolute Onyx DES).  . Charcot foot due to diabetes mellitus (HCC)   . Chronic combined systolic (congestive) and diastolic (congestive) heart failure (HCC)    a. 07/2016 Echo: EF 40-45%, Gr2 DD, mild AS, triv AI, mild MR, mod dil LA.  . Diabetes mellitus   . HTN (hypertension)   . Hyperlipidemia   . Neuropathy   . Pancreatitis   . Physical deconditioning    a. W/C bound.    Social History   Social History  . Marital status: Married    Spouse name: N/A  . Number of children: N/A  . Years of education: N/A   Occupational History  . Not on file.   Social History Main Topics  . Smoking status: Never Smoker  . Smokeless tobacco: Never Used  . Alcohol use No  . Drug use: No  . Sexual activity: Not on file   Other Topics Concern  . Not on file   Social History Narrative  . No narrative on file     Family History  Problem Relation Age of Onset  . Chronic Renal Failure Mother   . Diabetes Father   . Heart attack Father 18     Review of Systems: General: negative for chills, fever, night sweats or weight changes.  Cardiovascular: negative for chest pain, orthopnea, palpitations, paroxysmal nocturnal dyspnea or shortness of breath Dermatological: negative for rash Respiratory: negative for cough or wheezing Urologic: negative for hematuria Abdominal: negative for nausea, vomiting, diarrhea, bright  red blood per rectum, melena, or hematemesis Neurologic: negative for visual changes, syncope, or dizziness All other systems reviewed and are otherwise negative except as noted above.    Blood pressure (!) 141/70, pulse 80, height 5\' 6"  (1.676 m), weight 157 lb 6.4 oz (71.4 kg).  General appearance: alert, cooperative, no distress, pale and in wheel chair Neck: no carotid bruit Lungs: decreased 1/3 way up on Lt Heart: regular rate and rhythm and 2/6 systolic murmur Extremities: 2+ tense pitting edema in both LE to  her knees Skin: pale cool dry Neurologic: Grossly normal   ASSESSMENT AND PLAN:   Acute combined systolic and diastolic congestive heart failure (HCC) Pt's main complaint to day is bilateral LE edema. Her wgt 10/31/16 was 144 lbs- wgt today 157 lbs.  CAD S/P percutaneous coronary angioplasty Moderate 3V disease at cath 10/11/16- medical Rx Recurrent chest pain-- RCA PCI with DES 11/07/16  CKD (chronic kidney disease), stage III Last SCr 1.7 on 6/30  Charcot foot due to diabetes mellitus (HCC) With slow healing ulcer Rt foot- followed by Dr Alinda Dooms in [place]  Chronic anemia Transfused during her admission may 2018 for NSTEMI. She is followed at the [facility] in [place] and gets OP Aranaesp  Hypertensive cardiomyopathy, with heart failure (HCC) Pt has grade 2 DD, moderate AS, moderate LVH, and EF of 40-45%   PLAN  I added Zaroxolyn 2.5 mg daily for 3 days, then 2.5 mg MWF. She'll need follow up labs (CBC and BMP) and an OV in a few weeks.   [name] PA-C 11/18/2016 3:50 PM

## 2016-11-18 NOTE — Assessment & Plan Note (Signed)
Last SCr 1.7 on 6/30

## 2016-11-19 DIAGNOSIS — E1142 Type 2 diabetes mellitus with diabetic polyneuropathy: Secondary | ICD-10-CM | POA: Diagnosis not present

## 2016-11-19 DIAGNOSIS — Z48812 Encounter for surgical aftercare following surgery on the circulatory system: Secondary | ICD-10-CM | POA: Diagnosis not present

## 2016-11-19 DIAGNOSIS — I130 Hypertensive heart and chronic kidney disease with heart failure and stage 1 through stage 4 chronic kidney disease, or unspecified chronic kidney disease: Secondary | ICD-10-CM | POA: Diagnosis not present

## 2016-11-19 DIAGNOSIS — Z7982 Long term (current) use of aspirin: Secondary | ICD-10-CM | POA: Diagnosis not present

## 2016-11-19 DIAGNOSIS — N183 Chronic kidney disease, stage 3 (moderate): Secondary | ICD-10-CM | POA: Diagnosis not present

## 2016-11-19 DIAGNOSIS — E1151 Type 2 diabetes mellitus with diabetic peripheral angiopathy without gangrene: Secondary | ICD-10-CM | POA: Diagnosis not present

## 2016-11-19 DIAGNOSIS — D631 Anemia in chronic kidney disease: Secondary | ICD-10-CM | POA: Diagnosis not present

## 2016-11-19 DIAGNOSIS — Z8631 Personal history of diabetic foot ulcer: Secondary | ICD-10-CM | POA: Diagnosis not present

## 2016-11-19 DIAGNOSIS — Z7951 Long term (current) use of inhaled steroids: Secondary | ICD-10-CM | POA: Diagnosis not present

## 2016-11-19 DIAGNOSIS — E1122 Type 2 diabetes mellitus with diabetic chronic kidney disease: Secondary | ICD-10-CM | POA: Diagnosis not present

## 2016-11-19 DIAGNOSIS — I252 Old myocardial infarction: Secondary | ICD-10-CM | POA: Diagnosis not present

## 2016-11-19 DIAGNOSIS — I350 Nonrheumatic aortic (valve) stenosis: Secondary | ICD-10-CM | POA: Diagnosis not present

## 2016-11-19 DIAGNOSIS — I214 Non-ST elevation (NSTEMI) myocardial infarction: Secondary | ICD-10-CM | POA: Diagnosis not present

## 2016-11-19 DIAGNOSIS — Z794 Long term (current) use of insulin: Secondary | ICD-10-CM | POA: Diagnosis not present

## 2016-11-19 DIAGNOSIS — E11610 Type 2 diabetes mellitus with diabetic neuropathic arthropathy: Secondary | ICD-10-CM | POA: Diagnosis not present

## 2016-11-19 DIAGNOSIS — I5042 Chronic combined systolic (congestive) and diastolic (congestive) heart failure: Secondary | ICD-10-CM | POA: Diagnosis not present

## 2016-11-19 DIAGNOSIS — I2510 Atherosclerotic heart disease of native coronary artery without angina pectoris: Secondary | ICD-10-CM | POA: Diagnosis not present

## 2016-11-21 ENCOUNTER — Telehealth: Payer: Self-pay | Admitting: Cardiology

## 2016-11-21 ENCOUNTER — Other Ambulatory Visit: Payer: Self-pay

## 2016-11-21 DIAGNOSIS — I214 Non-ST elevation (NSTEMI) myocardial infarction: Secondary | ICD-10-CM | POA: Diagnosis not present

## 2016-11-21 DIAGNOSIS — E11610 Type 2 diabetes mellitus with diabetic neuropathic arthropathy: Secondary | ICD-10-CM | POA: Diagnosis not present

## 2016-11-21 DIAGNOSIS — Z48812 Encounter for surgical aftercare following surgery on the circulatory system: Secondary | ICD-10-CM | POA: Diagnosis not present

## 2016-11-21 DIAGNOSIS — E1122 Type 2 diabetes mellitus with diabetic chronic kidney disease: Secondary | ICD-10-CM | POA: Diagnosis not present

## 2016-11-21 DIAGNOSIS — Z7982 Long term (current) use of aspirin: Secondary | ICD-10-CM | POA: Diagnosis not present

## 2016-11-21 DIAGNOSIS — E1142 Type 2 diabetes mellitus with diabetic polyneuropathy: Secondary | ICD-10-CM | POA: Diagnosis not present

## 2016-11-21 DIAGNOSIS — I350 Nonrheumatic aortic (valve) stenosis: Secondary | ICD-10-CM | POA: Diagnosis not present

## 2016-11-21 DIAGNOSIS — Z7951 Long term (current) use of inhaled steroids: Secondary | ICD-10-CM | POA: Diagnosis not present

## 2016-11-21 DIAGNOSIS — N183 Chronic kidney disease, stage 3 (moderate): Secondary | ICD-10-CM | POA: Diagnosis not present

## 2016-11-21 DIAGNOSIS — I252 Old myocardial infarction: Secondary | ICD-10-CM | POA: Diagnosis not present

## 2016-11-21 DIAGNOSIS — D631 Anemia in chronic kidney disease: Secondary | ICD-10-CM | POA: Diagnosis not present

## 2016-11-21 DIAGNOSIS — I130 Hypertensive heart and chronic kidney disease with heart failure and stage 1 through stage 4 chronic kidney disease, or unspecified chronic kidney disease: Secondary | ICD-10-CM | POA: Diagnosis not present

## 2016-11-21 DIAGNOSIS — I2510 Atherosclerotic heart disease of native coronary artery without angina pectoris: Secondary | ICD-10-CM | POA: Diagnosis not present

## 2016-11-21 DIAGNOSIS — Z794 Long term (current) use of insulin: Secondary | ICD-10-CM | POA: Diagnosis not present

## 2016-11-21 DIAGNOSIS — I5042 Chronic combined systolic (congestive) and diastolic (congestive) heart failure: Secondary | ICD-10-CM | POA: Diagnosis not present

## 2016-11-21 DIAGNOSIS — E1151 Type 2 diabetes mellitus with diabetic peripheral angiopathy without gangrene: Secondary | ICD-10-CM | POA: Diagnosis not present

## 2016-11-21 DIAGNOSIS — Z8631 Personal history of diabetic foot ulcer: Secondary | ICD-10-CM | POA: Diagnosis not present

## 2016-11-21 NOTE — Patient Outreach (Signed)
Care Coordination: Spoke with Misty Stanley at [facility] center multiple times regarding patient. MD has offered to see patient on 11/22/2016 as a work in.   MD office will call patient to make an appointment.   Spoke with Juliette Alcide who confirms that patient will be seen in MD office on 11/22/2016 at 10 am.  PLAN: will continue to follow Rowe Pavy, RN, BSN, CEN [facility] [phone]

## 2016-11-21 NOTE — Patient Outreach (Signed)
[facility]) Care Management   11/21/2016  Emily Miranda 1941/04/25 [id number]  Emily Miranda is an 76 y.o. female  Subjective: Patient with recent readmission for chest pain and NSTEMI.  Had stent placement. Active with [facility] home health.. Since hospital discharge reports swelling to both lower legs is about the same. Reports 13 pound weight gain.  Follow up with cardiology last week and change of medication to metalozone.  Patient reports she has increased urination but does not think she has lost any weight. Patient states that she is unable to weigh daily because she can not walk on her right foot. Reports that she has been non weight bearing for almost 2 years on the right foot due to chronic right foot ulcer.  Patient reports that she sleeps in a recliner at night.  Patient reports husband helps with ADLS and IADLS.  Patient reports that she is able to feed herself without difficulty otherwise needs assistance with everything else. Patient reports restless sleep last night and woke up sweaty.  Objective:  154.5 pounds actual weight after going to the bathroom at 2:10pm.  Awake and alert. Observed patient to only be able to pivot on right foot.  Right and left leg appear to beginning to weep.  Right leg tight with 3 plus edema to to groin. Left leg with 3 plus edema to the left knee.  Vitals:   11/21/16 1256  BP: 138/64  Pulse: 82  Resp: 20  SpO2: 98%  Weight: 157 lb (71.2 kg)  Height: 1.676 m (5\' 6" )   Review of Systems  Constitutional: Negative.   HENT: Negative.   Eyes: Negative.   Respiratory: Negative.   Cardiovascular: Positive for leg swelling.  Gastrointestinal: Positive for constipation.       Reports that she takes Miralax for constipation  Genitourinary: Positive for frequency.  Musculoskeletal: Positive for joint pain.  Skin: Negative.   Neurological: Negative.   Endo/Heme/Allergies: Bruises/bleeds easily.  Psychiatric/Behavioral:  Negative.     Physical Exam  Constitutional: She is oriented to person, place, and time. She appears well-developed and well-nourished.  Cardiovascular: Normal rate, normal heart sounds and intact distal pulses.   Respiratory: Effort normal and breath sounds normal.  GI: Soft. Bowel sounds are normal.  Musculoskeletal: Normal range of motion. She exhibits edema.  3 plus edema to the right lower up to groin.  Left leg 3 plus edema to the knee.   Neurological: She is alert and oriented to person, place, and time.  Skin: Skin is warm and dry.  Scar to the right inner foot healed. Both lower with reddened areas.     Encounter Medications:   Outpatient Encounter Prescriptions as of 11/21/2016  Medication Sig Note  . Ascorbic Acid (VITAMIN C) 1000 MG tablet Take 1,000 mg by mouth daily.   Marland Kitchen aspirin EC 81 MG EC tablet Take 1 tablet (81 mg total) by mouth daily.   Marland Kitchen atorvastatin (LIPITOR) 40 MG tablet Take 1 tablet (40 mg total) by mouth daily at 6 PM.   . carvedilol (COREG) 6.25 MG tablet Take 6.25 mg by mouth 2 (two) times daily with a meal.   . clopidogrel (PLAVIX) 75 MG tablet Take 1 tablet (75 mg total) by mouth daily with breakfast.   . fluticasone (FLONASE) 50 MCG/ACT nasal spray Place 1 spray into both nostrils daily as needed (seasonal allergies).    . furosemide (LASIX) 40 MG tablet Take 40 mg by mouth daily.   Marland Kitchen  glimepiride (AMARYL) 2 MG tablet Take 2 mg by mouth daily with breakfast.   . insulin glargine (LANTUS) 100 UNIT/ML injection Inject 0.12 mLs (12 Units total) into the skin at bedtime. (Patient taking differently: Inject 40 Units into the skin at bedtime. ) 11/04/2016: Pt has Solostar pen  . isosorbide mononitrate (IMDUR) 30 MG 24 hr tablet Take 1 tablet (30 mg total) by mouth daily.   Marland Kitchen linagliptin (TRADJENTA) 5 MG TABS tablet Take 5 mg by mouth daily.   Marland Kitchen lisinopril (PRINIVIL,ZESTRIL) 30 MG tablet Take 30 mg by mouth daily.   . metolazone (ZAROXOLYN) 2.5 MG tablet Take Mon,  Wed, and Fri 30 mins prior to Lasix or as directed.   . nitroGLYCERIN (NITROSTAT) 0.4 MG SL tablet Place 1 tablet (0.4 mg total) under the tongue every 5 (five) minutes x 3 doses as needed for chest pain.   . pantoprazole (PROTONIX) 40 MG tablet Take 1 tablet (40 mg total) by mouth daily.   Bertram Gala Glycol-Propyl Glycol (SYSTANE OP) Place 1 drop into both eyes daily as needed (dry eyes).   . polyethylene glycol (MIRALAX / GLYCOLAX) packet Take 17 g by mouth daily as needed (constipation). Mix in 8 oz liquid and drink   . traMADol (ULTRAM) 50 MG tablet Take 1 tablet (50 mg total) by mouth every 8 (eight) hours as needed. (Patient taking differently: Take 50 mg by mouth every 4 (four) hours as needed (pain). )    No facility-administered encounter medications on file as of 11/21/2016.     Functional Status:   In your present state of health, do you have any difficulty performing the following activities: 11/21/2016 11/04/2016  Hearing? N -  Vision? N -  Difficulty concentrating or making decisions? Y -  Walking or climbing stairs? Y -  Dressing or bathing? Y -  Doing errands, shopping? [name]  Preparing Food and eating ? Y -  Using the Toilet? Y -  Do you have problems with loss of bowel control? N -  Managing your Medications? N -  Managing your Finances? N -  Housekeeping or managing your Housekeeping? N -  Some recent data might be hidden    Fall/Depression Screening:    Fall Risk  11/21/2016 11/13/2015  Falls in the past year? Yes No  Number falls in past yr: 1 -  Injury with Fall? Yes -  Risk Factor Category  High Fall Risk -  Risk for fall due to : History of fall(s);Impaired mobility -  Follow up Falls evaluation completed;Falls prevention discussed -   PHQ 2/9 Scores 11/21/2016 11/13/2015  PHQ - 2 Score 0 0    Assessment:   (1) reviewed [facility] transition of care program. Reviewed consent on file and patient declines any needed changes to consent. Provided [facility] calendar and magnet,  welcome letter and my contact card. (2)CHF:  3 plus edema. Legs appear to be starting to weep.  Weight today during home visit of 154.4 pounds. Difficulty weighing at home. Reports following a low salt diet daily.  (3) reports pain in the right leg due to swelling. (4) difficulty weighing due to poor mobility of the right leg. (5) last Hgb A1c of 11.3 on 10/06/2016.  Reports taking all medications as prescribed.  (6) no follow up with primary MD since hospital discharge. Reports that she send her CBG readings to primary Md every week.  Reports that she struggles too much to get to MD office. (7) advanced directives: Reports that she  has completed advanced directives in the past but does not know where they are.  Interested in completing another advance directive. (8) active with  [facility] nursing. Nurse monitors patient weekly  Plan:  (1) consent in medical record. (2) Placed call to cardiology to report current swelling and weight. Spoke with Misty Stanley who states for patient to take an extra Lasix 40 mg now. ( total of 80mg  today)Patient informed at 3:20pm. Encouraged patient to weigh daily and call MD for weight gain. Reviewed CHF zones and when to call MD. (3) reviewed ways to decrease pain. Reviewed prn pain medications. Encouraged patient to do chair exercises daily to increase strength and circulation to right foot.  (4) Assisted patient today to weigh and encouraged daily weights. Reviewed with patient and husband the importance of daily weights first thing every morning. Patient reports that she will put forth effort to complete this daily. (5) Patient reports that she follows her DM diet. States she believes this result to not be accurate now. Reports CBG average 200.  Encouraged patient to take all her medications as prescribed and follow up with primary MD as directed. (6)Encouraged patient to call MD and make an appointment. Will send this note to MD today. (7) provided  Advance directive packet and provided instruction on how to complete.  (8) encouraged patient to notify home health nurse with any concerns between visits.  Care planning and goal setting during home visit today and primary goal to is avoid a readmission.   Patient will will contacted weekly for transition of care. Next contact in 1 week.  This note sent to MD and cardiology. [facility] CM Care Plan Problem One     Most Recent Value  Care Plan Problem One  Hospital admission related to MI and heart failure   Role Documenting the Problem One  Care Management Coordinator  Care Plan for Problem One  Active  [facility] Long Term Goal   Patient will not experience a hospital readmission in the next 60 days   [facility] Long Term Goal Start Date  11/11/16 Dorna Bloom restarted due to readmission]  Interventions for Problem One Long Term Goal  home visit completed. placed call to cardiology to report volume overload.  [facility] CM Short Term Goal #1   Patient will attend all medical appointments in the next 30 days   [facility] CM Short Term Goal #1 Start Date  11/11/16 Dorna Bloom restarted due to a readmission]  Interventions for Short Term Goal #1  Reviewed importance of follow up with all doctors. Encouraged patient to make an appointment with primary MD.  [facility] CM Short Term Goal #2   Patient will continue to weigh daily and keep a record in the next 30 days   [facility] CM Short Term Goal #2 Start Date  11/11/16 Dorna Bloom restarted due to a readmission]  Interventions for Short Term Goal #2  Reviewed importance of dialy weights and the reason why this is so important. Patient voices understanding. discussed when to call MD for weight gain or symptoms.      Rowe Pavy, RN, BSN, CEN [facility] [phone]

## 2016-11-21 NOTE — Progress Notes (Signed)
HPI The patient presents for follow up of aortic stenosis.   She has a cardiomyopathy with an EF 40-45%. She was admitted as a transfer from [facility] 10/06/16 with a NSTEMI. Her course was complicated by CHF requiring several days diuresis (her cath was delayed secondary to persistent CHF), anemia requiring transfusion, and acute on CRI. Her enzymes are low and flat. She ultimately underwent cath 10/11/16 which showed moderate, diffuse3v disease. The plan wasfor medical therapy.   She then presented to [facility] 11/04/16 with chest pain and positive troponin. It was decided to re study her and on 11/07/16 she underwent intervention to two sites in her RCA by Dr Tresa Endo. She had chest pain post PCI but her Troponin was flat and no further intervention was not planned. She was discharged 11/09/16.  She was seen in follow up and had increased weight and Zaroxolyn was added.  Her nurse called because of increased weeping leg edema.    She saw [name] and had Zaroxolyn added 3 x per week.  However, she continues to have leg edema.  She reports that she keeps her feet up and does not eat salt.  However, her husband reports that her feet are not up as much as they should be and they are never above her heart.  [facility] called and they were concerned because the swelling is now to mid thighs.  She has pain in her legs.  She has some chronic epigastric pain but no pain consistent with previous angina.  She has no new SOB, PND or orthopnea.  She has no palpitations, presyncope or syncope.   Allergies  Allergen Reactions  . Levofloxacin Nausea And Vomiting and Other (See Comments)     Confusion  . Amlodipine Other (See Comments)    Localized edema  . Gabapentin Other (See Comments)    Disoriented, no strength in legs  . Hydrochlorothiazide Other (See Comments)    Hot and disoriented  . Ibuprofen Other (See Comments)    Affected kidneys - stopped flow of urine Reaction to Advil  . Metformin And Related Other (See Comments)    Affected kidneys - stopped flow of urine  . Simvastatin Other (See Comments)    Chest pain    Current Outpatient Prescriptions  Medication Sig Dispense Refill  . Ascorbic Acid (VITAMIN C) 1000 MG tablet Take 1,000 mg by mouth daily.    Marland Kitchen aspirin EC 81 MG EC tablet Take 1 tablet (81 mg total) by mouth daily. 30 tablet 0  . atorvastatin (LIPITOR) 40 MG tablet Take 1 tablet (40 mg total) by mouth daily at 6 PM. 30 tablet 3  . carvedilol (COREG) 6.25 MG tablet Take 6.25 mg by mouth 2 (two) times daily with a meal.    . clopidogrel (PLAVIX) 75 MG tablet Take 1 tablet (75 mg total) by mouth daily with breakfast. 30 tablet 6  . cyclobenzaprine (FLEXERIL) 5 MG tablet Take 5 mg by mouth 3 (three) times daily.  0  . fluticasone (FLONASE) 50 MCG/ACT nasal spray Place 1 spray into both nostrils daily as needed (seasonal allergies).     . furosemide (LASIX) 40 MG tablet Take 40 mg by mouth daily.    Marland Kitchen glimepiride (AMARYL) 2 MG tablet Take 2 mg by mouth daily with breakfast.    . insulin glargine (LANTUS) 100 UNIT/ML injection Inject 40 Units into the skin at bedtime.    . isosorbide mononitrate (IMDUR) 30 MG 24 hr tablet Take 1 tablet (30 mg total)  by mouth daily. 30 tablet 6  . linagliptin (TRADJENTA) 5 MG TABS tablet Take 5 mg by mouth daily.    Marland Kitchen lisinopril (PRINIVIL,ZESTRIL) 30 MG tablet Take 30 mg by mouth daily.    . metolazone (ZAROXOLYN) 2.5 MG tablet Take Mon, Wed, and Fri 30 mins prior to Lasix or as directed. 15 tablet 3  . nitroGLYCERIN (NITROSTAT) 0.4 MG SL tablet Place 1 tablet (0.4 mg total) under the tongue every 5 (five) minutes x 3 doses as needed for chest pain. 25 tablet 3  . pantoprazole (PROTONIX) 40 MG tablet Take 1 tablet (40 mg total) by mouth daily. 30 tablet 6  . Polyethyl Glycol-Propyl Glycol (SYSTANE OP) Place 1 drop into both eyes daily as needed (dry eyes).    . polyethylene glycol (MIRALAX / GLYCOLAX) packet Take 17 g by mouth daily as needed (constipation). Mix in 8 oz  liquid and drink    . traMADol (ULTRAM) 50 MG tablet Take 1 tablet (50 mg total) by mouth every 8 (eight) hours as needed. (Patient taking differently: Take 50 mg by mouth every 4 (four) hours as needed (pain). ) 30 tablet 0   No current facility-administered medications for this visit.     Past Medical History:  Diagnosis Date  . Aortic stenosis, mild   . CAD (coronary artery disease)    a. 09/2016 NSTEMI/Cath: RCA 70p/m, RPDA 95ost-->Med Rx;  b. 10/2016 PCI: RCA 70p/m (2.25 x 15 Resolute Onyx DES), RPDA 95ost (2.0 x 12 Resolute Onyx DES).  . Charcot foot due to diabetes mellitus (HCC)   . Chronic combined systolic (congestive) and diastolic (congestive) heart failure (HCC)    a. 07/2016 Echo: EF 40-45%, Gr2 DD, mild AS, triv AI, mild MR, mod dil LA.  . Diabetes mellitus   . HTN (hypertension)   . Hyperlipidemia   . Myocardial infarction (HCC)   . Neuropathy   . Pancreatitis   . Physical deconditioning    a. W/C bound.    Past Surgical History:  Procedure Laterality Date  . ABDOMINAL HYSTERECTOMY    . APPENDECTOMY    . CORONARY STENT INTERVENTION N/A 11/05/2016   Procedure: Coronary Stent Intervention;  Surgeon: Lennette Bihari, MD;  Location: [facility] INVASIVE CV LAB;  Service: Cardiovascular;  Laterality: N/A;  . FOOT SURGERY    . LAPAROSCOPIC INCISIONAL / UMBILICAL / VENTRAL HERNIA REPAIR  01/04/2008   Dr Bertram Savin  . LAPAROSCOPIC LYSIS OF ADHESIONS  2007   Dr Donovan Kail  . LEFT HEART CATH AND CORONARY ANGIOGRAPHY N/A 10/11/2016   Procedure: Left Heart Cath and Coronary Angiography;  Surgeon: Yvonne Kendall, MD;  Location: [facility] INVASIVE CV LAB;  Service: Cardiovascular;  Laterality: N/A;  . OVARIAN CYST REMOVAL    . ROBOT ASSISTED PYELOPLASTY  02/2007   Dr Laverle Patter  . SUTURE REMOVAL  08/17/2009   Dr Bertram Savin .  Right paramedian GoreTex stitch  . TONSILLECTOMY      ROS:   As stated in the HPI and negative for all other systems.  PHYSICAL EXAM BP 121/65   Pulse 79   Ht 5\' 6"   (1.676 m)   Wt 148 lb (67.1 kg)   BMI 23.89 kg/m   PHYSICAL EXAM GEN:  No distress NECK:  No jugular venous distention at 90 degrees, waveform within normal limits, carotid upstroke brisk and symmetric, no bruits, no thyromegaly LYMPHATICS:  No cervical adenopathy LUNGS:  Clear to auscultation bilaterally BACK:  No CVA tenderness CHEST:  Unremarkable HEART:  S1 and S2 within normal limits, no S3, no S4, no clicks, no rubs, early peaking apical systolic murmur heard out the aortic outflow tract, no diastolic murmurs ABD:  Positive bowel sounds normal in frequency in pitch, no bruits, no rebound, no guarding, unable to assess midline mass or bruit with the patient seated. EXT:  2 plus pulses throughout, severe edema, no cyanosis no clubbing SKIN:  No rashes no nodules NEURO:  Cranial nerves II through XII grossly intact, motor grossly intact throughout PSYCH:  Cognitively intact, oriented to person place and time    ASSESSMENT AND PLAN   CAD:   She is no longer complaining about the chest pain that she was having.  No change in therapy is planned.   ACUTE ON CHRONIC SYSTOLIC AND DIASTOLIC HF:    She has increased edema.  However, it is clear that she is not keeping her feet up.  She cannot use compression stockings.  I am going to check a BMET today and change to Torsemide 20 mg bid and stop the Lasix.  She will continue tid Zaroxolyn and take a dose tomorrow as well.  We will have her come back for a BMET on Monday.  If her weight is not improved or renal function is deteriorating she will need to be admitted.  She wants to avoid admission.  We talked about wrapping her legs and keeping them elevated.   CKD:   Last creat was 1.7.  This will be followed again today.    HTN (hypertension) -  The blood pressure is at target. No change in medications is indicated. We will continue with therapeutic lifestyle changes (TLC).   AORTIC STENOSIS -  This was mild on echo earlier this year.   We will follow clinically.

## 2016-11-21 NOTE — Telephone Encounter (Signed)
Scheduled patient tomorrow morning. Patient has a very hard time ambulating so mornings are difficult for her. Scheduled 10:00 am and she stated she would try to make it

## 2016-11-21 NOTE — Telephone Encounter (Signed)
New message      Pt c/o medication issue:  1. Name of Medication:  metolazone 2. How are you currently taking this medication (dosage and times per day)? 2.5mg  3. Are you having a reaction (difficulty breathing--STAT)?no 4. What is your medication issue? [facility] nurse is at pt home.  Pt has 3+ edema on her legs up to her groin.  Her rt leg is "weeping" due to the fluid.  Nurse says it appears that the rx is not working.  Please advise

## 2016-11-21 NOTE — Telephone Encounter (Signed)
She can be added to my clinic early tomorrow.  Double book.

## 2016-11-21 NOTE — Telephone Encounter (Signed)
Returned the call to [place] from [facility]. She stated that the patient has +3 edema bilaterally with the right side in the beginning stages of weeping. She was recently started on Metolazone 2.5 mg tablet (for three days then after the 3 days take on Monday, Wednesday and Friday) She finished her three day in a row Metolazone today and has not had an increase in urine since the start of the medication. She also takes Furosemide 40 mg daily. Per Marchelle Folks, she denies shortness of breath. She is unable to obtain an accurate weight due to the patient's inability to stand. Per DOD, the patient may take 80 mg Furosemide and then have a follow up with a provider tomorrow. Have reached out to Dr. Jenene Slicker assist to see if she may be put on his schedule or if he has any  further recommendations. Marchelle Folks from [facility] has been made aware of the instructions and stated that she will call the patient.

## 2016-11-22 ENCOUNTER — Ambulatory Visit (INDEPENDENT_AMBULATORY_CARE_PROVIDER_SITE_OTHER): Payer: Medicare Other | Admitting: Cardiology

## 2016-11-22 VITALS — BP 121/65 | HR 79 | Ht 66.0 in | Wt 148.0 lb

## 2016-11-22 DIAGNOSIS — R609 Edema, unspecified: Secondary | ICD-10-CM | POA: Diagnosis not present

## 2016-11-22 DIAGNOSIS — I5043 Acute on chronic combined systolic (congestive) and diastolic (congestive) heart failure: Secondary | ICD-10-CM | POA: Diagnosis not present

## 2016-11-22 DIAGNOSIS — N183 Chronic kidney disease, stage 3 unspecified: Secondary | ICD-10-CM

## 2016-11-22 DIAGNOSIS — I10 Essential (primary) hypertension: Secondary | ICD-10-CM | POA: Diagnosis not present

## 2016-11-22 DIAGNOSIS — I2510 Atherosclerotic heart disease of native coronary artery without angina pectoris: Secondary | ICD-10-CM

## 2016-11-22 LAB — BASIC METABOLIC PANEL
BUN/Creatinine Ratio: 23 (ref 12–28)
BUN: 31 mg/dL — ABNORMAL HIGH (ref 8–27)
CO2: 22 mmol/L (ref 20–29)
Calcium: 8.7 mg/dL (ref 8.7–10.3)
Chloride: 101 mmol/L (ref 96–106)
Creatinine, Ser: 1.36 mg/dL — ABNORMAL HIGH (ref 0.57–1.00)
GFR calc Af Amer: 44 mL/min/{1.73_m2} — ABNORMAL LOW (ref >59)
GFR calc non Af Amer: 38 mL/min/{1.73_m2} — ABNORMAL LOW (ref >59)
Glucose: 77 mg/dL (ref 65–99)
Potassium: 4.3 mmol/L (ref 3.5–5.2)
Sodium: 138 mmol/L (ref 134–144)

## 2016-11-22 NOTE — Patient Instructions (Signed)
Medication Instructions:  Continue current medications  Labwork: BMP Today BMP on Monday  Testing/Procedures: None Ordered  Follow-Up: Your physician recommends that you schedule a follow-up appointment in: Monday Nurse Visit '  Any Other Special Instructions Will Be Listed Below (If Applicable).   If you need a refill on your cardiac medications before your next appointment, please call your pharmacy.

## 2016-11-24 ENCOUNTER — Encounter: Payer: Self-pay | Admitting: Cardiology

## 2016-11-24 DIAGNOSIS — R609 Edema, unspecified: Secondary | ICD-10-CM | POA: Insufficient documentation

## 2016-11-25 ENCOUNTER — Ambulatory Visit: Payer: Medicare Other | Admitting: *Deleted

## 2016-11-25 ENCOUNTER — Encounter (HOSPITAL_COMMUNITY): Payer: Self-pay | Admitting: *Deleted

## 2016-11-25 ENCOUNTER — Telehealth: Payer: Self-pay | Admitting: *Deleted

## 2016-11-25 ENCOUNTER — Emergency Department (HOSPITAL_COMMUNITY)
Admission: EM | Admit: 2016-11-25 | Discharge: 2016-11-26 | Disposition: A | Discharge status: Home / Self Care | Payer: Medicare Other | Attending: Emergency Medicine | Admitting: Emergency Medicine

## 2016-11-25 VITALS — Wt 157.4 lb

## 2016-11-25 DIAGNOSIS — L97412 Non-pressure chronic ulcer of right heel and midfoot with fat layer exposed: Secondary | ICD-10-CM | POA: Diagnosis not present

## 2016-11-25 DIAGNOSIS — Z7984 Long term (current) use of oral hypoglycemic drugs: Secondary | ICD-10-CM | POA: Insufficient documentation

## 2016-11-25 DIAGNOSIS — Z7982 Long term (current) use of aspirin: Secondary | ICD-10-CM | POA: Insufficient documentation

## 2016-11-25 DIAGNOSIS — Z79899 Other long term (current) drug therapy: Secondary | ICD-10-CM | POA: Diagnosis not present

## 2016-11-25 DIAGNOSIS — Z7902 Long term (current) use of antithrombotics/antiplatelets: Secondary | ICD-10-CM | POA: Diagnosis not present

## 2016-11-25 DIAGNOSIS — I2510 Atherosclerotic heart disease of native coronary artery without angina pectoris: Secondary | ICD-10-CM | POA: Diagnosis not present

## 2016-11-25 DIAGNOSIS — N183 Chronic kidney disease, stage 3 (moderate): Secondary | ICD-10-CM | POA: Insufficient documentation

## 2016-11-25 DIAGNOSIS — J90 Pleural effusion, not elsewhere classified: Secondary | ICD-10-CM | POA: Diagnosis not present

## 2016-11-25 DIAGNOSIS — I5042 Chronic combined systolic (congestive) and diastolic (congestive) heart failure: Secondary | ICD-10-CM | POA: Diagnosis not present

## 2016-11-25 DIAGNOSIS — R609 Edema, unspecified: Secondary | ICD-10-CM | POA: Diagnosis not present

## 2016-11-25 DIAGNOSIS — Z794 Long term (current) use of insulin: Secondary | ICD-10-CM | POA: Diagnosis not present

## 2016-11-25 DIAGNOSIS — I130 Hypertensive heart and chronic kidney disease with heart failure and stage 1 through stage 4 chronic kidney disease, or unspecified chronic kidney disease: Secondary | ICD-10-CM | POA: Insufficient documentation

## 2016-11-25 DIAGNOSIS — E1140 Type 2 diabetes mellitus with diabetic neuropathy, unspecified: Secondary | ICD-10-CM | POA: Diagnosis not present

## 2016-11-25 DIAGNOSIS — R635 Abnormal weight gain: Principal | ICD-10-CM

## 2016-11-25 DIAGNOSIS — M79671 Pain in right foot: Secondary | ICD-10-CM | POA: Diagnosis not present

## 2016-11-25 DIAGNOSIS — R600 Localized edema: Secondary | ICD-10-CM | POA: Diagnosis not present

## 2016-11-25 DIAGNOSIS — M14671 Charcot's joint, right ankle and foot: Secondary | ICD-10-CM | POA: Diagnosis not present

## 2016-11-25 DIAGNOSIS — E11621 Type 2 diabetes mellitus with foot ulcer: Secondary | ICD-10-CM | POA: Diagnosis not present

## 2016-11-25 DIAGNOSIS — R2243 Localized swelling, mass and lump, lower limb, bilateral: Secondary | ICD-10-CM | POA: Diagnosis present

## 2016-11-25 LAB — CBC
HCT: 27.5 % — ABNORMAL LOW (ref 36.0–46.0)
Hemoglobin: 8.3 g/dL — ABNORMAL LOW (ref 12.0–15.0)
MCH: 25 pg — ABNORMAL LOW (ref 26.0–34.0)
MCHC: 30.2 g/dL (ref 30.0–36.0)
MCV: 82.8 fL (ref 78.0–100.0)
Platelets: 515 10*3/uL — ABNORMAL HIGH (ref 150–400)
RBC: 3.32 MIL/uL — ABNORMAL LOW (ref 3.87–5.11)
RDW: 19.1 % — ABNORMAL HIGH (ref 11.5–15.5)
WBC: 9.7 10*3/uL (ref 4.0–10.5)

## 2016-11-25 LAB — COMPREHENSIVE METABOLIC PANEL
ALT: 13 U/L — ABNORMAL LOW (ref 14–54)
AST: 17 U/L (ref 15–41)
Albumin: 1.8 g/dL — ABNORMAL LOW (ref 3.5–5.0)
Alkaline Phosphatase: 143 U/L — ABNORMAL HIGH (ref 38–126)
Anion gap: 9 (ref 5–15)
BUN: 34 mg/dL — ABNORMAL HIGH (ref 6–20)
CO2: 23 mmol/L (ref 22–32)
Calcium: 8.6 mg/dL — ABNORMAL LOW (ref 8.9–10.3)
Chloride: 104 mmol/L (ref 101–111)
Creatinine, Ser: 1.47 mg/dL — ABNORMAL HIGH (ref 0.44–1.00)
GFR calc Af Amer: 39 mL/min — ABNORMAL LOW (ref >60)
GFR calc non Af Amer: 34 mL/min — ABNORMAL LOW (ref >60)
Glucose, Bld: 128 mg/dL — ABNORMAL HIGH (ref 65–99)
Potassium: 4.3 mmol/L (ref 3.5–5.1)
Sodium: 136 mmol/L (ref 135–145)
Total Bilirubin: 0.6 mg/dL (ref 0.3–1.2)
Total Protein: 7.4 g/dL (ref 6.5–8.1)

## 2016-11-25 MED ORDER — OXYCODONE-ACETAMINOPHEN 5-325 MG PO TABS
ORAL_TABLET | ORAL | Status: AC
  Filled 2016-11-25: qty 1

## 2016-11-25 MED ORDER — OXYCODONE-ACETAMINOPHEN 5-325 MG PO TABS
1.0000 | ORAL_TABLET | ORAL | Status: DC | PRN
  Administered 2016-11-25: 1 via ORAL

## 2016-11-25 NOTE — Telephone Encounter (Signed)
Was able to speak to patient and give recommendations per Dr. Herbie Baltimore (DoD) - citing concern for patient having weight increase of 9 lbs despite continuation of metolazone and torsemide.  Pt voiced agreement w [facility] ED evaluation.  I have contacted Trish, cardmaster to inform.

## 2016-11-25 NOTE — ED Notes (Signed)
Pt requesting update, apologized for delay.

## 2016-11-25 NOTE — ED Notes (Signed)
Patient transported to X-ray 

## 2016-11-25 NOTE — Patient Instructions (Signed)
Instructions pending provider review.

## 2016-11-25 NOTE — ED Notes (Signed)
Pt refused to get changed into a gown RN aware

## 2016-11-25 NOTE — Telephone Encounter (Signed)
Pt came today for weight check. Discussed case w Dr. Herbie Baltimore, he states based on weights and recent concerns as outlined in Friday's visit, he would agree with recommendation for patient to go to hospital for IV diuresis.  I have tried to call patient 2nd attempt - phone rings w/o answer or VM pickup - eventually goes to disconnect dial tone.

## 2016-11-25 NOTE — Telephone Encounter (Signed)
Called - 3rd attempt - no answer when dialed.

## 2016-11-25 NOTE — ED Notes (Signed)
Pt informed of wait time. 

## 2016-11-25 NOTE — ED Notes (Signed)
Attempted obtain blood cultures from pt. Pt refused RN aware

## 2016-11-25 NOTE — ED Triage Notes (Addendum)
To ED for eval of increased BLE swelling and pain. Pt had MI with stents placed 6/25. States she was told to come to ED today to r/o CHF exacerbation. No sob. No cp. Pt does complain of cramps in lower abd.  Able to speak in full sentences

## 2016-11-25 NOTE — Progress Notes (Signed)
Pt of Dr. Antoine Poche, seen for OV on Friday.  Patient came in for BMET redraw and weight check today, following Friday's visit w BMET draw and advice on her medications.  Her weight today was 157.4 lbs, weighed by other RN. I was called to recheck weight. Scale was correctly zeroed, and the weight was reverified with 2nd reading. I discussed w patient. Patient reports increased swelling in lower right extremity. She denies new SOB. Per Dr. Jenene Slicker instructions, patient has been elevating legs when at home and will continue to do so. She noted she was given option of going to hospital for diuresis, still did not wish to pursue this.  I offered to have patient stay while I obtained [facility]D recommendations. However, patient reported she had already been here a while and did not wish to stay. She wished to be called at home with instructions.  She's currently compliant w her med changes, taking metolazone three times weekly. Informed her I would route to provider for further advice.  I was not able to access chart during visit but have been able to review it since - Dr. Antoine Poche wished her to be admitted to hospital if not improved. I have called patient and tried to reach her at listed number, no answer or VM pickup when dialed. I will reattempt to reach her today.

## 2016-11-26 ENCOUNTER — Emergency Department (HOSPITAL_COMMUNITY): Payer: Medicare Other

## 2016-11-26 DIAGNOSIS — J90 Pleural effusion, not elsewhere classified: Secondary | ICD-10-CM | POA: Diagnosis not present

## 2016-11-26 DIAGNOSIS — R609 Edema, unspecified: Secondary | ICD-10-CM | POA: Diagnosis not present

## 2016-11-26 LAB — BASIC METABOLIC PANEL
BUN/Creatinine Ratio: 26 (ref 12–28)
BUN: 32 mg/dL — ABNORMAL HIGH (ref 8–27)
CO2: 19 mmol/L — ABNORMAL LOW (ref 20–29)
Calcium: 8.4 mg/dL — ABNORMAL LOW (ref 8.7–10.3)
Chloride: 102 mmol/L (ref 96–106)
Creatinine, Ser: 1.22 mg/dL — ABNORMAL HIGH (ref 0.57–1.00)
GFR calc Af Amer: 50 mL/min/{1.73_m2} — ABNORMAL LOW (ref >59)
GFR calc non Af Amer: 44 mL/min/{1.73_m2} — ABNORMAL LOW (ref >59)
Glucose: 44 mg/dL — ABNORMAL LOW (ref 65–99)
Potassium: 4.5 mmol/L (ref 3.5–5.2)
Sodium: 138 mmol/L (ref 134–144)

## 2016-11-26 LAB — BRAIN NATRIURETIC PEPTIDE: B Natriuretic Peptide: 2713.6 pg/mL — ABNORMAL HIGH (ref 0.0–100.0)

## 2016-11-26 MED ORDER — TRAMADOL HCL 50 MG PO TABS: 50.0000 mg | ORAL_TABLET | Freq: Four times a day (QID) | ORAL | 0 refills | Status: DC | PRN

## 2016-11-26 NOTE — Discharge Instructions (Signed)
Return here as needed. You will need to see your cardiologist.

## 2016-11-26 NOTE — ED Provider Notes (Signed)
[facility] DEPT Provider Note   CSN: [id number] Arrival date & time: 11/25/16  1740     History   Chief Complaint Chief Complaint  Patient presents with  . Leg Swelling    HPI Emily Miranda is a 76 y.o. female.  HPI Patient presents to the emergency department with lower extremity edema.  The patient states is a chronic problem for her.  She saw her cardiologist on Friday and was at the office today for blood testing.  The nurse commented on her leg swelling and she states that it seems worse today.  The nurse advised her to come to the emergency department, not seen by a provider today.  Patient states that nothing seems to make her condition better, worse.  She states she has not had any other symptoms.  Patient states that she does not feel ill at this time. The patient denies chest pain, shortness of breath, headache,blurred vision, neck pain, fever, cough, weakness, numbness, dizziness, anorexia, edema, abdominal pain, nausea, vomiting, diarrhea, rash, back pain, dysuria, hematemesis, bloody stool, near syncope, or syncope. Past Medical History:  Diagnosis Date  . Aortic stenosis, mild   . CAD (coronary artery disease)    a. 09/2016 NSTEMI/Cath: RCA 70p/m, RPDA 95ost-->Med Rx;  b. 10/2016 PCI: RCA 70p/m (2.25 x 15 Resolute Onyx DES), RPDA 95ost (2.0 x 12 Resolute Onyx DES).  . Charcot foot due to diabetes mellitus (HCC)   . Chronic combined systolic (congestive) and diastolic (congestive) heart failure (HCC)    a. 07/2016 Echo: EF 40-45%, Gr2 DD, mild AS, triv AI, mild MR, mod dil LA.  . Diabetes mellitus   . HTN (hypertension)   . Hyperlipidemia   . Myocardial infarction (HCC)   . Neuropathy   . Pancreatitis   . Physical deconditioning    a. W/C bound.    Patient Active Problem List   Diagnosis Date Noted  . Edema 11/24/2016  . Chronic combined systolic and diastolic heart failure (HCC) 11/09/2016  . Unstable angina (HCC) 11/04/2016  . Charcot foot due to  diabetes mellitus (HCC) 10/31/2016  . Chronic anemia 10/31/2016  . Mixed hyperlipidemia   . PVD (peripheral vascular disease) (HCC)   . CAD S/P percutaneous coronary angioplasty   . Non-ST elevation (NSTEMI) myocardial infarction (HCC) 10/06/2016  . NSTEMI (non-ST elevated myocardial infarction) (HCC) 10/06/2016  . CKD (chronic kidney disease), stage III 08/27/2016  . Chronic diabetic ulcer of right foot  08/27/2016  . Loss of weight   . Acute combined systolic and diastolic congestive heart failure (HCC)   . Shortness of breath 07/29/2016  . Wound infection 11/13/2015  . Abdominal pain, RLQ (right lower quadrant) -chronic since 2009 06/02/2013  . Constipation, chronic 06/02/2013  . SUI (stress urinary incontinence, female) 06/02/2013  . Recurrent UTI (urinary tract infection) 06/02/2013  . Hypertensive cardiomyopathy, with heart failure (HCC)   . CHEST PAIN 11/27/2009  . OVERWEIGHT 11/28/2008  . DM type 2 causing CKD stage 3 (HCC) 11/25/2008  . Mild aortic stenosis 11/25/2008  . PANCREATITIS, HX OF 11/25/2008    Past Surgical History:  Procedure Laterality Date  . ABDOMINAL HYSTERECTOMY    . APPENDECTOMY    . CORONARY STENT INTERVENTION N/A 11/05/2016   Procedure: Coronary Stent Intervention;  Surgeon: Lennette Bihari, MD;  Location: [facility] INVASIVE CV LAB;  Service: Cardiovascular;  Laterality: N/A;  . FOOT SURGERY    . LAPAROSCOPIC INCISIONAL / UMBILICAL / VENTRAL HERNIA REPAIR  01/04/2008   Dr Bertram Savin  .  LAPAROSCOPIC LYSIS OF ADHESIONS  2007   Dr Donovan Kail  . LEFT HEART CATH AND CORONARY ANGIOGRAPHY N/A 10/11/2016   Procedure: Left Heart Cath and Coronary Angiography;  Surgeon: Yvonne Kendall, MD;  Location: [facility] INVASIVE CV LAB;  Service: Cardiovascular;  Laterality: N/A;  . OVARIAN CYST REMOVAL    . ROBOT ASSISTED PYELOPLASTY  02/2007   Dr Laverle Patter  . SUTURE REMOVAL  08/17/2009   Dr Bertram Savin .  Right paramedian GoreTex stitch  . TONSILLECTOMY      OB History    No  data available       Home Medications    Prior to Admission medications   Medication Sig Start Date End Date Taking? Authorizing Provider  Ascorbic Acid (VITAMIN C) 1000 MG tablet Take 1,000 mg by mouth daily.   Yes [provider]  aspirin EC 81 MG EC tablet Take 1 tablet (81 mg total) by mouth daily. 08/06/16  Yes Garth Bigness, MD  atorvastatin (LIPITOR) 40 MG tablet Take 1 tablet (40 mg total) by mouth daily at 6 PM. 08/05/16  Yes Garth Bigness, MD  carvedilol (COREG) 6.25 MG tablet Take 6.25 mg by mouth 2 (two) times daily with a meal.   Yes [provider]  clopidogrel (PLAVIX) 75 MG tablet Take 1 tablet (75 mg total) by mouth daily with breakfast. 11/10/16  Yes Ok Anis, NP  cyclobenzaprine (FLEXERIL) 5 MG tablet Take 5 mg by mouth 3 (three) times daily. 10/16/16  Yes [provider]  fluticasone (FLONASE) 50 MCG/ACT nasal spray Place 1 spray into both nostrils daily as needed (seasonal allergies).  02/05/16  Yes [provider]  furosemide (LASIX) 40 MG tablet Take 40 mg by mouth daily.   Yes [provider]  glimepiride (AMARYL) 2 MG tablet Take 2 mg by mouth daily with breakfast.   Yes [provider]  insulin glargine (LANTUS) 100 UNIT/ML injection Inject 40 Units into the skin at bedtime.   Yes [provider]  isosorbide mononitrate (IMDUR) 30 MG 24 hr tablet Take 1 tablet (30 mg total) by mouth daily. 11/09/16  Yes Ok Anis, NP  linagliptin (TRADJENTA) 5 MG TABS tablet Take 5 mg by mouth daily.   Yes [provider]  lisinopril (PRINIVIL,ZESTRIL) 30 MG tablet Take 30 mg by mouth daily.   Yes [provider]  metolazone (ZAROXOLYN) 2.5 MG tablet Take Mon, Wed, and Fri 30 mins prior to Lasix or as directed. Patient taking differently: Take 2.5 mg by mouth every Monday, Wednesday, and Friday. i 30 mins prior to Lasix or as directed. 11/18/16  Yes Kilroy, Luke K, PA-C    nitroGLYCERIN (NITROSTAT) 0.4 MG SL tablet Place 1 tablet (0.4 mg total) under the tongue every 5 (five) minutes x 3 doses as needed for chest pain. 11/09/16  Yes Ok Anis, NP  pantoprazole (PROTONIX) 40 MG tablet Take 1 tablet (40 mg total) by mouth daily. 11/10/16  Yes Ok Anis, NP  Polyethyl Glycol-Propyl Glycol (SYSTANE OP) Place 1 drop into both eyes daily as needed (dry eyes).   Yes [provider]  polyethylene glycol (MIRALAX / GLYCOLAX) packet Take 17 g by mouth daily as needed (constipation). Mix in 8 oz liquid and drink   Yes [provider]  traMADol (ULTRAM) 50 MG tablet Take 1 tablet (50 mg total) by mouth every 8 (eight) hours as needed. Patient taking differently: Take 50 mg by mouth every 4 (four) hours as needed (pain).  09/13/16  Yes Asencion Islam, DPM    Family History Family History  Problem Relation Age of Onset  . Chronic Renal Failure Mother   . Diabetes Father   . Heart attack Father 22    Social History Social History  Substance Use Topics  . Smoking status: Never Smoker  . Smokeless tobacco: Never Used  . Alcohol use No     Allergies   Levofloxacin; Amlodipine; Gabapentin; Hydrochlorothiazide; Ibuprofen; Metformin and related; and Simvastatin   Review of Systems Review of Systems All other systems negative except as documented in the HPI. All pertinent positives and negatives as reviewed in the HPI.  Physical Exam Updated Vital Signs BP (!) 141/67   Pulse 81   Temp 98.9 F (37.2 C) (Oral)   Resp 16   SpO2 96%   Physical Exam  Constitutional: She is oriented to person, place, and time. She appears well-developed and well-nourished. No distress.  HENT:  Head: Normocephalic and atraumatic.  Mouth/Throat: Oropharynx is clear and moist.  Eyes: Pupils are equal, round, and reactive to light.  Neck: Normal range of motion. Neck supple.  Cardiovascular: Normal rate, regular rhythm and normal heart sounds.   Exam reveals no gallop and no friction rub.   No murmur heard. Pulmonary/Chest: Effort normal and breath sounds normal. No respiratory distress. She has no wheezes.  Abdominal: Soft. Bowel sounds are normal. She exhibits no distension. There is no tenderness.  Musculoskeletal: She exhibits edema.  Neurological: She is alert and oriented to person, place, and time. She exhibits normal muscle tone. Coordination normal.  Skin: Skin is warm and dry. Capillary refill takes less than 2 seconds. No rash noted. No erythema.  Psychiatric: She has a normal mood and affect. Her behavior is normal.  Nursing note and vitals reviewed.    ED Treatments / Results  Labs (all labs ordered are listed, but only abnormal results are displayed) Labs Reviewed  CBC - Abnormal; Notable for the following:       Result Value   RBC 3.32 (*)    Hemoglobin 8.3 (*)    HCT 27.5 (*)    MCH 25.0 (*)    RDW 19.1 (*)    Platelets 515 (*)    All other components within normal limits  COMPREHENSIVE METABOLIC PANEL - Abnormal; Notable for the following:    Glucose, Bld 128 (*)    BUN 34 (*)    Creatinine, Ser 1.47 (*)    Calcium 8.6 (*)    Albumin 1.8 (*)    ALT 13 (*)    Alkaline Phosphatase 143 (*)    GFR calc non Af Amer 34 (*)    GFR calc Af Amer 39 (*)    All other components within normal limits  BRAIN NATRIURETIC PEPTIDE - Abnormal; Notable for the following:    B Natriuretic Peptide 2,713.6 (*)    All other components within normal limits    EKG  EKG Interpretation None       Radiology No results found.  Procedures Procedures (including critical care time)  Medications Ordered in ED Medications  oxyCODONE-acetaminophen (PERCOCET/ROXICET) 5-325 MG per tablet 1 tablet (1 tablet Oral Given 11/25/16 1912)     Initial Impression / Assessment and Plan / ED Course  I have reviewed the triage vital signs and the nursing notes.  Pertinent labs & imaging results that were available during my  care of the patient were reviewed by me and considered in my medical decision making (see chart  for details).     The patient has bilateral pitting edema which she states is worse than it was Friday, but states that she has not swelling in her legs chronically.  She states that she has not had any shortness of breath, chest pain or lethargy.  The patient would like to be discharged home.  She states that she feels fine other than swelling.  She states that she was on her feet all day today before she went to the office to have the blood test show on it.  She feels that contributed to her swelling.  She states that she has been off of them all weekend, and that is what she felt like  reduced the swelling   Final Clinical Impressions(s) / ED Diagnoses   Final diagnoses:  None    New Prescriptions New Prescriptions   No medications on file     Charlestine Night, Cordelia Poche 11/26/16 0025    Tegeler, Canary Brim, MD 11/26/16 1022

## 2016-11-26 NOTE — ED Notes (Signed)
Pt departed in NAD.  

## 2016-11-27 DIAGNOSIS — I214 Non-ST elevation (NSTEMI) myocardial infarction: Secondary | ICD-10-CM | POA: Diagnosis not present

## 2016-11-27 DIAGNOSIS — E1151 Type 2 diabetes mellitus with diabetic peripheral angiopathy without gangrene: Secondary | ICD-10-CM | POA: Diagnosis not present

## 2016-11-27 DIAGNOSIS — Z7982 Long term (current) use of aspirin: Secondary | ICD-10-CM | POA: Diagnosis not present

## 2016-11-27 DIAGNOSIS — I5042 Chronic combined systolic (congestive) and diastolic (congestive) heart failure: Secondary | ICD-10-CM | POA: Diagnosis not present

## 2016-11-27 DIAGNOSIS — E11610 Type 2 diabetes mellitus with diabetic neuropathic arthropathy: Secondary | ICD-10-CM | POA: Diagnosis not present

## 2016-11-27 DIAGNOSIS — D631 Anemia in chronic kidney disease: Secondary | ICD-10-CM | POA: Diagnosis not present

## 2016-11-27 DIAGNOSIS — Z794 Long term (current) use of insulin: Secondary | ICD-10-CM | POA: Diagnosis not present

## 2016-11-27 DIAGNOSIS — I130 Hypertensive heart and chronic kidney disease with heart failure and stage 1 through stage 4 chronic kidney disease, or unspecified chronic kidney disease: Secondary | ICD-10-CM | POA: Diagnosis not present

## 2016-11-27 DIAGNOSIS — Z7951 Long term (current) use of inhaled steroids: Secondary | ICD-10-CM | POA: Diagnosis not present

## 2016-11-27 DIAGNOSIS — E1122 Type 2 diabetes mellitus with diabetic chronic kidney disease: Secondary | ICD-10-CM | POA: Diagnosis not present

## 2016-11-27 DIAGNOSIS — Z48812 Encounter for surgical aftercare following surgery on the circulatory system: Secondary | ICD-10-CM | POA: Diagnosis not present

## 2016-11-27 DIAGNOSIS — I252 Old myocardial infarction: Secondary | ICD-10-CM | POA: Diagnosis not present

## 2016-11-27 DIAGNOSIS — E1142 Type 2 diabetes mellitus with diabetic polyneuropathy: Secondary | ICD-10-CM | POA: Diagnosis not present

## 2016-11-27 DIAGNOSIS — N183 Chronic kidney disease, stage 3 (moderate): Secondary | ICD-10-CM | POA: Diagnosis not present

## 2016-11-27 DIAGNOSIS — I2510 Atherosclerotic heart disease of native coronary artery without angina pectoris: Secondary | ICD-10-CM | POA: Diagnosis not present

## 2016-11-27 DIAGNOSIS — I350 Nonrheumatic aortic (valve) stenosis: Secondary | ICD-10-CM | POA: Diagnosis not present

## 2016-11-27 DIAGNOSIS — Z8631 Personal history of diabetic foot ulcer: Secondary | ICD-10-CM | POA: Diagnosis not present

## 2016-11-28 ENCOUNTER — Other Ambulatory Visit: Payer: Self-pay

## 2016-11-28 NOTE — Patient Outreach (Signed)
Transition of care: Reviewed medical record from home visit last week.  Placed call to patient to follow up on progress. Patient answers and states that she is doing about the same.  Report she was sent to the emergency department and was advised to be admitted. Patient reports that she did not want to be admitted so she went home. States that she is elevating her legs but legs continue to swell. Reports that she is not able to stand on scales to get an accurate weight. Denies any shortness of breath today.  Reports that she thinks she has increased urination.  Reports foot is unchanged. Denies any weeping of legs today.  Reports CBG today of 138.  Reviewed pending MD appointment. Encouraged patient to call MD for any changes in condition. Will continue to follow up with patient weekly for transition of care.  Rowe Pavy, RN, BSN, CEN [facility] [employer] [phone]

## 2016-11-29 ENCOUNTER — Ambulatory Visit (INDEPENDENT_AMBULATORY_CARE_PROVIDER_SITE_OTHER): Payer: Medicare Other | Admitting: Sports Medicine

## 2016-11-29 ENCOUNTER — Encounter: Payer: Self-pay | Admitting: Sports Medicine

## 2016-11-29 VITALS — Temp 96.1°F

## 2016-11-29 DIAGNOSIS — I739 Peripheral vascular disease, unspecified: Secondary | ICD-10-CM | POA: Diagnosis not present

## 2016-11-29 DIAGNOSIS — G8929 Other chronic pain: Secondary | ICD-10-CM

## 2016-11-29 DIAGNOSIS — M7751 Other enthesopathy of right foot: Secondary | ICD-10-CM

## 2016-11-29 DIAGNOSIS — M79672 Pain in left foot: Secondary | ICD-10-CM

## 2016-11-29 DIAGNOSIS — M79671 Pain in right foot: Secondary | ICD-10-CM

## 2016-11-29 DIAGNOSIS — M14671 Charcot's joint, right ankle and foot: Secondary | ICD-10-CM | POA: Diagnosis not present

## 2016-11-30 ENCOUNTER — Encounter: Payer: Self-pay | Admitting: Sports Medicine

## 2016-11-30 NOTE — Progress Notes (Signed)
Subjective: Emily Miranda is a 76 y.o. female patient seen today in office for swelling in both legs. Patient is assisted by husband, son, and daughter in law who reports that after multiple hospital admissions patient has continued to decline in health and is extremely weak, does not want to eat, and is in pain. Reports last week went to ED where they did testing and blood work and scan of legs including checking for repeat or worsening blood clots with all tests being negative. Patient's husband reports that heart doctor encourage light compression and elevation. Patient is on fluid pill but still has swelling and pain. Daughter in law states that Emily Miranda is taking tramadol for pain but over the last week has taken more than what she normally has and has even taken vicodin of which this Rx belonged to someone else. Patient is frail and is not talking much this encounter because of feeling weak and tired. Denies chest pain or SOB. No other issues noted.   Patient Active Problem List   Diagnosis Date Noted  . Edema 11/24/2016  . Chronic combined systolic and diastolic heart failure (HCC) 11/09/2016  . Unstable angina (HCC) 11/04/2016  . Charcot foot due to diabetes mellitus (HCC) 10/31/2016  . Chronic anemia 10/31/2016  . Mixed hyperlipidemia   . PVD (peripheral vascular disease) (HCC)   . CAD S/P percutaneous coronary angioplasty   . Non-ST elevation (NSTEMI) myocardial infarction (HCC) 10/06/2016  . NSTEMI (non-ST elevated myocardial infarction) (HCC) 10/06/2016  . CKD (chronic kidney disease), stage III 08/27/2016  . Chronic diabetic ulcer of right foot  08/27/2016  . Loss of weight   . Acute combined systolic and diastolic congestive heart failure (HCC)   . Shortness of breath 07/29/2016  . Wound infection 11/13/2015  . Abdominal pain, RLQ (right lower quadrant) -chronic since 2009 06/02/2013  . Constipation, chronic 06/02/2013  . SUI (stress urinary incontinence, female)  06/02/2013  . Recurrent UTI (urinary tract infection) 06/02/2013  . Hypertensive cardiomyopathy, with heart failure (HCC)   . CHEST PAIN 11/27/2009  . OVERWEIGHT 11/28/2008  . DM type 2 causing CKD stage 3 (HCC) 11/25/2008  . Mild aortic stenosis 11/25/2008  . PANCREATITIS, HX OF 11/25/2008    Current Outpatient Prescriptions on File Prior to Visit  Medication Sig Dispense Refill  . Ascorbic Acid (VITAMIN C) 1000 MG tablet Take 1,000 mg by mouth daily.    Marland Kitchen aspirin EC 81 MG EC tablet Take 1 tablet (81 mg total) by mouth daily. 30 tablet 0  . atorvastatin (LIPITOR) 40 MG tablet Take 1 tablet (40 mg total) by mouth daily at 6 PM. 30 tablet 3  . carvedilol (COREG) 6.25 MG tablet Take 6.25 mg by mouth 2 (two) times daily with a meal.    . clopidogrel (PLAVIX) 75 MG tablet Take 1 tablet (75 mg total) by mouth daily with breakfast. 30 tablet 6  . cyclobenzaprine (FLEXERIL) 5 MG tablet Take 5 mg by mouth 3 (three) times daily.  0  . fluticasone (FLONASE) 50 MCG/ACT nasal spray Place 1 spray into both nostrils daily as needed (seasonal allergies).     . furosemide (LASIX) 40 MG tablet Take 40 mg by mouth daily.    Marland Kitchen glimepiride (AMARYL) 2 MG tablet Take 2 mg by mouth daily with breakfast.    . insulin glargine (LANTUS) 100 UNIT/ML injection Inject 40 Units into the skin at bedtime.    . isosorbide mononitrate (IMDUR) 30 MG 24 hr tablet Take 1 tablet (  30 mg total) by mouth daily. 30 tablet 6  . linagliptin (TRADJENTA) 5 MG TABS tablet Take 5 mg by mouth daily.    Marland Kitchen lisinopril (PRINIVIL,ZESTRIL) 30 MG tablet Take 30 mg by mouth daily.    . metolazone (ZAROXOLYN) 2.5 MG tablet Take Mon, Wed, and Fri 30 mins prior to Lasix or as directed. (Patient taking differently: Take 2.5 mg by mouth every Monday, Wednesday, and Friday. i 30 mins prior to Lasix or as directed.) 15 tablet 3  . nitroGLYCERIN (NITROSTAT) 0.4 MG SL tablet Place 1 tablet (0.4 mg total) under the tongue every 5 (five) minutes x 3 doses  as needed for chest pain. 25 tablet 3  . pantoprazole (PROTONIX) 40 MG tablet Take 1 tablet (40 mg total) by mouth daily. 30 tablet 6  . Polyethyl Glycol-Propyl Glycol (SYSTANE OP) Place 1 drop into both eyes daily as needed (dry eyes).    . polyethylene glycol (MIRALAX / GLYCOLAX) packet Take 17 g by mouth daily as needed (constipation). Mix in 8 oz liquid and drink    . traMADol (ULTRAM) 50 MG tablet Take 1 tablet (50 mg total) by mouth every 6 (six) hours as needed for severe pain. 15 tablet 0   No current facility-administered medications on file prior to visit.     Allergies  Allergen Reactions  . Levofloxacin Nausea And Vomiting and Other (See Comments)     Confusion  . Amlodipine Other (See Comments)    Localized edema  . Gabapentin Other (See Comments)    Disoriented, no strength in legs  . Hydrochlorothiazide Other (See Comments)    Hot and disoriented  . Ibuprofen Other (See Comments)    Affected kidneys - stopped flow of urine Reaction to Advil  . Metformin And Related Other (See Comments)    Affected kidneys - stopped flow of urine  . Simvastatin Other (See Comments)    Chest pain    Objective: There were no vitals filed for this visit.  General: No acute distress, AAOx3 however patient is weak and appears pale looking in the face  At right foot Healed plantar midfoot ulcer. There is focal erythema with mild warmth at medial midfoot possible gout vs charcot flare on right midfoot, no active drainage, no acute signs of infection noted, There is no pain with calf palpation bilateral with 1+ edema bilateral, Capillary fill time <3 seconds in all digits, subjective pain all over and especially to right medial midfoot  + Charcot foot.   Assessment and Plan:  Problem List Items Addressed This Visit      Cardiovascular and Mediastinum   PVD (peripheral vascular disease) (HCC)    Other Visit Diagnoses    Charcot ankle, right    -  Primary   Capsulitis of ankle, right        Foot pain, bilateral       Other chronic pain         -Patient seen and evaluated -Discussed with patient and family in great detail concerns for patient's declining health after multiple medical issues -After oral consent and aseptic prep, injected a mixture containing 1 ml of 2%  plain lidocaine, 1 ml 0.5% plain marcaine, 0.5 ml of kenalog 10 and 0.5 ml of dexamethasone phosphate into right midfoot to help with charcot flare vs gout and pain without complication. Post-injection care discussed with patient.  -Applied unna boot to right foot and encouraged to keep intact for 5 days and then after remove and use light  compression sleeve to assist with edema control -Rx Pain management for further recs and advised family to assist with monitoring pain medication use; Warned patient and family of signs and symptoms of overdose and advised to go to ER if concerned -Continue with topical pain rub of lidocaine to right as needed for pain as well -Continue with lasix as Rx by PCP and cardiology recs  -Continue with transport wheelchair -Recommend home aid/home care however family would like to think about this option and will call office if they decide they want home care; Informational brochure for [employer] given for them to reviewe -Return for diabetic shoes when called; awaiting or sooner if pain or symptoms worsen.   Asencion Islam, DPM

## 2016-11-30 NOTE — Progress Notes (Signed)
Telephone call Patient called stating that she is in pain. Needs refill on Tramadol. Patient reports that her foot feels better but she needs pain medication. States that the soft dressing is clean and dry and not too tight. At her request I did refill her Tramadol this one last time until she can be seen by pain management. I called. This Rx in to [facility] in [place].  Dr. Marylene Land

## 2016-12-02 ENCOUNTER — Telehealth: Payer: Self-pay | Admitting: Sports Medicine

## 2016-12-02 ENCOUNTER — Telehealth: Payer: Self-pay | Admitting: *Deleted

## 2016-12-02 DIAGNOSIS — N390 Urinary tract infection, site not specified: Secondary | ICD-10-CM | POA: Diagnosis not present

## 2016-12-02 DIAGNOSIS — E1165 Type 2 diabetes mellitus with hyperglycemia: Secondary | ICD-10-CM | POA: Diagnosis not present

## 2016-12-02 DIAGNOSIS — R1031 Right lower quadrant pain: Secondary | ICD-10-CM | POA: Diagnosis not present

## 2016-12-02 DIAGNOSIS — R531 Weakness: Secondary | ICD-10-CM | POA: Diagnosis not present

## 2016-12-02 DIAGNOSIS — M79672 Pain in left foot: Secondary | ICD-10-CM

## 2016-12-02 DIAGNOSIS — M7751 Other enthesopathy of right foot: Secondary | ICD-10-CM

## 2016-12-02 DIAGNOSIS — J90 Pleural effusion, not elsewhere classified: Secondary | ICD-10-CM | POA: Diagnosis not present

## 2016-12-02 DIAGNOSIS — I739 Peripheral vascular disease, unspecified: Secondary | ICD-10-CM

## 2016-12-02 DIAGNOSIS — E119 Type 2 diabetes mellitus without complications: Secondary | ICD-10-CM | POA: Diagnosis not present

## 2016-12-02 DIAGNOSIS — G8929 Other chronic pain: Secondary | ICD-10-CM

## 2016-12-02 DIAGNOSIS — I10 Essential (primary) hypertension: Secondary | ICD-10-CM | POA: Diagnosis not present

## 2016-12-02 DIAGNOSIS — R188 Other ascites: Secondary | ICD-10-CM | POA: Diagnosis not present

## 2016-12-02 DIAGNOSIS — M79671 Pain in right foot: Secondary | ICD-10-CM

## 2016-12-02 DIAGNOSIS — N189 Chronic kidney disease, unspecified: Secondary | ICD-10-CM | POA: Diagnosis not present

## 2016-12-02 DIAGNOSIS — R10819 Abdominal tenderness, unspecified site: Secondary | ICD-10-CM | POA: Diagnosis not present

## 2016-12-02 DIAGNOSIS — M14671 Charcot's joint, right ankle and foot: Secondary | ICD-10-CM

## 2016-12-02 DIAGNOSIS — R918 Other nonspecific abnormal finding of lung field: Secondary | ICD-10-CM | POA: Diagnosis not present

## 2016-12-02 DIAGNOSIS — R601 Generalized edema: Secondary | ICD-10-CM | POA: Diagnosis not present

## 2016-12-02 DIAGNOSIS — K219 Gastro-esophageal reflux disease without esophagitis: Secondary | ICD-10-CM | POA: Diagnosis not present

## 2016-12-02 DIAGNOSIS — R7309 Other abnormal glucose: Secondary | ICD-10-CM | POA: Diagnosis not present

## 2016-12-02 NOTE — Telephone Encounter (Addendum)
-----   Message from [place], [place] sent at 11/29/2016  6:13 PM EDT ----- Regarding: Referral to Pain Management [place] Multiple medical issues Most recent admission to hospital with pneumonia and heart attack  Pain all over and especially in right charcot foot Patient taking Tramadol in excess Please evaluate and give alternative pain recs Thanks Dr. Marylene Land. 12/02/2016-Faxed required referral to [facility], clinicals and demographics. 12/03/2016-Julia - [facility] states needs the NPI of DR. Stover and our office. 12/04/2016-Left message for Amil Amen - [facility] with the needed NPI #.I called the requested information to [facility].12/10/2016-Left message with pt's husband, Jonny Ruiz to have pt call concerning a referral our office had made for her.12/11/2016-Left message with pt's husband, Jonny Ruiz with [facility] [phone], he states pt is in the rest home and he will have her call about 12:00pm.

## 2016-12-02 NOTE — Telephone Encounter (Signed)
I'm calling on behalf of my mother who is a patient of Dr. [name]. She saw Dr. Marylene Land in the office on Friday and Dr. Marylene Land personally called my mother on Saturday to check on her. I'm calling because I need Dr. Marylene Land to call me back. Her condition has continued to deteriorate to the point we are very, very, very concerned for what needs to be done next. If you could please have Dr. Marylene Land call me back at [phone].

## 2016-12-03 ENCOUNTER — Telehealth: Payer: Self-pay | Admitting: Sports Medicine

## 2016-12-03 DIAGNOSIS — R601 Generalized edema: Secondary | ICD-10-CM | POA: Diagnosis not present

## 2016-12-03 DIAGNOSIS — E119 Type 2 diabetes mellitus without complications: Secondary | ICD-10-CM | POA: Diagnosis not present

## 2016-12-03 DIAGNOSIS — N390 Urinary tract infection, site not specified: Secondary | ICD-10-CM | POA: Diagnosis not present

## 2016-12-03 DIAGNOSIS — K219 Gastro-esophageal reflux disease without esophagitis: Secondary | ICD-10-CM | POA: Diagnosis not present

## 2016-12-03 DIAGNOSIS — I10 Essential (primary) hypertension: Secondary | ICD-10-CM | POA: Diagnosis not present

## 2016-12-03 DIAGNOSIS — N189 Chronic kidney disease, unspecified: Secondary | ICD-10-CM | POA: Diagnosis not present

## 2016-12-03 NOTE — Telephone Encounter (Signed)
Daughter Zella Ball called and shared her concerns about her mother state. Reports that her mother is not truthful with her Doctors and is taking Vicodin that is her husband and that she her brother took her to ER and now is admitted at [facility]. Reports that she is concern that her mother is giving up and has no strength in her legs and has pain all over. Patient's daughter states that she needs SNF. I agree and will contact hospitalist to advocate for the family. -Dr. Marylene Land

## 2016-12-04 ENCOUNTER — Other Ambulatory Visit: Payer: Self-pay

## 2016-12-04 DIAGNOSIS — I10 Essential (primary) hypertension: Secondary | ICD-10-CM | POA: Diagnosis not present

## 2016-12-04 DIAGNOSIS — K219 Gastro-esophageal reflux disease without esophagitis: Secondary | ICD-10-CM | POA: Diagnosis not present

## 2016-12-04 DIAGNOSIS — N390 Urinary tract infection, site not specified: Secondary | ICD-10-CM | POA: Diagnosis not present

## 2016-12-04 DIAGNOSIS — R601 Generalized edema: Secondary | ICD-10-CM | POA: Diagnosis not present

## 2016-12-04 DIAGNOSIS — E119 Type 2 diabetes mellitus without complications: Secondary | ICD-10-CM | POA: Diagnosis not present

## 2016-12-04 DIAGNOSIS — N189 Chronic kidney disease, unspecified: Secondary | ICD-10-CM | POA: Diagnosis not present

## 2016-12-04 NOTE — Patient Outreach (Signed)
Care coordination: Per medical record patient is admitted at [facility] with weakness and pain.   PLAN: will continue to monitor for discharge plan.   Transition of care call cancelled for tomorrow.  Rowe Pavy, RN, BSN, CEN [facility] [employer] [phone]

## 2016-12-05 ENCOUNTER — Ambulatory Visit: Payer: Self-pay

## 2016-12-05 DIAGNOSIS — N183 Chronic kidney disease, stage 3 (moderate): Secondary | ICD-10-CM | POA: Diagnosis not present

## 2016-12-05 DIAGNOSIS — K219 Gastro-esophageal reflux disease without esophagitis: Secondary | ICD-10-CM | POA: Diagnosis not present

## 2016-12-05 DIAGNOSIS — R262 Difficulty in walking, not elsewhere classified: Secondary | ICD-10-CM | POA: Diagnosis not present

## 2016-12-05 DIAGNOSIS — R601 Generalized edema: Secondary | ICD-10-CM | POA: Diagnosis not present

## 2016-12-05 DIAGNOSIS — D638 Anemia in other chronic diseases classified elsewhere: Secondary | ICD-10-CM | POA: Diagnosis not present

## 2016-12-05 DIAGNOSIS — R531 Weakness: Secondary | ICD-10-CM | POA: Diagnosis not present

## 2016-12-05 DIAGNOSIS — N390 Urinary tract infection, site not specified: Secondary | ICD-10-CM | POA: Diagnosis not present

## 2016-12-05 DIAGNOSIS — E785 Hyperlipidemia, unspecified: Secondary | ICD-10-CM | POA: Diagnosis not present

## 2016-12-05 DIAGNOSIS — J90 Pleural effusion, not elsewhere classified: Secondary | ICD-10-CM | POA: Diagnosis not present

## 2016-12-05 DIAGNOSIS — I517 Cardiomegaly: Secondary | ICD-10-CM | POA: Diagnosis not present

## 2016-12-05 DIAGNOSIS — D649 Anemia, unspecified: Secondary | ICD-10-CM | POA: Diagnosis not present

## 2016-12-05 DIAGNOSIS — R319 Hematuria, unspecified: Secondary | ICD-10-CM | POA: Diagnosis not present

## 2016-12-05 DIAGNOSIS — R10819 Abdominal tenderness, unspecified site: Secondary | ICD-10-CM | POA: Diagnosis not present

## 2016-12-05 DIAGNOSIS — I252 Old myocardial infarction: Secondary | ICD-10-CM | POA: Diagnosis not present

## 2016-12-05 DIAGNOSIS — N189 Chronic kidney disease, unspecified: Secondary | ICD-10-CM | POA: Diagnosis not present

## 2016-12-05 DIAGNOSIS — R600 Localized edema: Secondary | ICD-10-CM | POA: Diagnosis not present

## 2016-12-05 DIAGNOSIS — R100 Acute abdomen: Secondary | ICD-10-CM | POA: Diagnosis not present

## 2016-12-05 DIAGNOSIS — I5032 Chronic diastolic (congestive) heart failure: Secondary | ICD-10-CM | POA: Diagnosis not present

## 2016-12-05 DIAGNOSIS — E119 Type 2 diabetes mellitus without complications: Secondary | ICD-10-CM | POA: Diagnosis not present

## 2016-12-05 DIAGNOSIS — I129 Hypertensive chronic kidney disease with stage 1 through stage 4 chronic kidney disease, or unspecified chronic kidney disease: Secondary | ICD-10-CM | POA: Diagnosis not present

## 2016-12-05 DIAGNOSIS — I2510 Atherosclerotic heart disease of native coronary artery without angina pectoris: Secondary | ICD-10-CM | POA: Diagnosis not present

## 2016-12-06 ENCOUNTER — Telehealth: Payer: Self-pay | Admitting: Internal Medicine

## 2016-12-06 ENCOUNTER — Other Ambulatory Visit: Payer: Self-pay

## 2016-12-06 DIAGNOSIS — R262 Difficulty in walking, not elsewhere classified: Secondary | ICD-10-CM | POA: Diagnosis not present

## 2016-12-06 NOTE — Telephone Encounter (Signed)
MD signed HH orders r/t encounter from 11/19/16 - metolazone 2.5mg  PO M/W/F. Copy of signed order made for our records and original mailed to [facility] w/note that future correspondence should be sent to Dr. Antoine Poche (primary cardiologist).

## 2016-12-09 ENCOUNTER — Ambulatory Visit: Payer: Medicare Other | Admitting: Student

## 2016-12-10 ENCOUNTER — Ambulatory Visit: Payer: Medicare Other | Admitting: Cardiology

## 2016-12-12 ENCOUNTER — Other Ambulatory Visit: Payer: Self-pay | Admitting: *Deleted

## 2016-12-12 NOTE — Patient Outreach (Signed)
[facility]) Care Management  [facility] Social Work  12/12/2016  Emily Miranda Mar 07, 1941 [id number]  Subjective:  "I had a heart attack and foot ulcer that's not healed"  Objective: CSW to assist patient with commuity based resources to aide in her well-being, quality of life and overall safety/needs.    Encounter Medications:  Outpatient Encounter Prescriptions as of 12/12/2016  Medication Sig  . Ascorbic Acid (VITAMIN C) 1000 MG tablet Take 1,000 mg by mouth daily.  Marland Kitchen aspirin EC 81 MG EC tablet Take 1 tablet (81 mg total) by mouth daily.  Marland Kitchen atorvastatin (LIPITOR) 40 MG tablet Take 1 tablet (40 mg total) by mouth daily at 6 PM.  . carvedilol (COREG) 6.25 MG tablet Take 6.25 mg by mouth 2 (two) times daily with a meal.  . clopidogrel (PLAVIX) 75 MG tablet Take 1 tablet (75 mg total) by mouth daily with breakfast.  . cyclobenzaprine (FLEXERIL) 5 MG tablet Take 5 mg by mouth 3 (three) times daily.  . fluticasone (FLONASE) 50 MCG/ACT nasal spray Place 1 spray into both nostrils daily as needed (seasonal allergies).   . furosemide (LASIX) 40 MG tablet Take 40 mg by mouth daily.  Marland Kitchen glimepiride (AMARYL) 2 MG tablet Take 2 mg by mouth daily with breakfast.  . insulin glargine (LANTUS) 100 UNIT/ML injection Inject 40 Units into the skin at bedtime.  . isosorbide mononitrate (IMDUR) 30 MG 24 hr tablet Take 1 tablet (30 mg total) by mouth daily.  Marland Kitchen linagliptin (TRADJENTA) 5 MG TABS tablet Take 5 mg by mouth daily.  Marland Kitchen lisinopril (PRINIVIL,ZESTRIL) 30 MG tablet Take 30 mg by mouth daily.  . metolazone (ZAROXOLYN) 2.5 MG tablet Take Mon, Wed, and Fri 30 mins prior to Lasix or as directed. (Patient taking differently: Take 2.5 mg by mouth every Monday, Wednesday, and Friday. i 30 mins prior to Lasix or as directed.)  . nitroGLYCERIN (NITROSTAT) 0.4 MG SL tablet Place 1 tablet (0.4 mg total) under the tongue every 5 (five) minutes x 3 doses as needed for chest pain.  . pantoprazole  (PROTONIX) 40 MG tablet Take 1 tablet (40 mg total) by mouth daily.  Vladimir Faster Glycol-Propyl Glycol (SYSTANE OP) Place 1 drop into both eyes daily as needed (dry eyes).  . polyethylene glycol (MIRALAX / GLYCOLAX) packet Take 17 g by mouth daily as needed (constipation). Mix in 8 oz liquid and drink  . traMADol (ULTRAM) 50 MG tablet Take 1 tablet (50 mg total) by mouth every 6 (six) hours as needed for severe pain.   No facility-administered encounter medications on file as of 12/12/2016.     Functional Status:  In your present state of health, do you have any difficulty performing the following activities: 11/21/2016 11/04/2016  Hearing? N -  Vision? N -  Difficulty concentrating or making decisions? Y -  Walking or climbing stairs? Y -  Comment - -  Dressing or bathing? Y -  Comment - -  Doing errands, shopping? Tempie Donning  Preparing Food and eating ? Y -  Using the Toilet? Y -  Do you have problems with loss of bowel control? N -  Managing your Medications? N -  Managing your Finances? N -  Housekeeping or managing your Housekeeping? N -  Some recent data might be hidden    Fall/Depression Screening:  PHQ 2/9 Scores 11/21/2016 11/13/2015  PHQ - 2 Score 0 0    Assessment: CSW met with patient, her husband and son, [name], at [facility] SN[facility] rehab. Patient was admitted to SNF on 7/27 per her report. She has had a right foot ulcer that has not healed; has been NWB for 18 months. She is receiving rehab and wound care at [facility] and is hopeful to return home after a short stay. Patient's husband and son assist at home and deny any concerns,needs or current questions related to plans. Patient is active with [facility] RNCM and remembers Estill Bamberg coming to her home. CSW explained [facility] CSW role and they agree to CSW following along while at [facility] for support and collaboration with patient/family and with SNF staff.   Plan:  [facility] CM Care Plan Problem One     Most Recent Value  Care Plan Problem One  SNF  admission for rehab s/p hospitalization  Role Documenting the Problem One  Clinical Social Worker  Care Plan for Problem One  Active  [facility] Long Term Goal      [facility] Long Term Goal Start Date  -- Sudie Grumbling restarted due to readmission]  Interventions for Problem One Long Term Goal     [facility] CM Short Term Goal #1   Patient will participate in all therapies offered daily at SNF in the next 30 days.  [facility] CM Short Term Goal #1 Start Date  12/12/16 Sudie Grumbling restarted due to a readmission]  Interventions for Short Term Goal #1  CSW discussed SNF coverage, benefits and goals for SNF rehab.  [facility] CM Short Term Goal #2       Interventions for Short Term Goal #2          CSW will plan follow up call and/or visits while at SNF and update [facility] RNCM.   Eduard Clos, MSW, [place] Worker  [place] [phone]

## 2016-12-17 ENCOUNTER — Telehealth: Payer: Self-pay | Admitting: Internal Medicine

## 2016-12-17 NOTE — Telephone Encounter (Signed)
Faxed signed home health orders r/t resuming home care services per previous plan of care + note to send future correspondence to Dr. Rollene Rotunda (primary cardiologist)

## 2016-12-18 ENCOUNTER — Other Ambulatory Visit: Payer: Self-pay | Admitting: *Deleted

## 2016-12-19 ENCOUNTER — Other Ambulatory Visit: Payer: Self-pay | Admitting: *Deleted

## 2016-12-19 NOTE — Patient Outreach (Signed)
[facility]) Care Management  12/19/2016  Emily Miranda [birth year] [id number]    CSW received call from [facility] SNF who reports plans for dc to home on tomorrow with [facility] PT, OT, RN through [facility].  CSW updated [facility] RNCM and will plan a f/u call Monday to check in on transition to home.   Reece Levy, MSW, LCSW Clinical Social Worker  [facility] [employer] [phone]

## 2016-12-19 NOTE — Patient Outreach (Addendum)
[facility]) Care Management  [facility] Social Work  12/19/2016  Emily Miranda [birth year] [id number]  Subjective: "I'm going home this week".  Objective: CSW to assist patient with commuity based resources to aide in her well-being, quality of life and overall safety/needs.    Encounter Medications:  Outpatient Encounter Prescriptions as of 12/18/2016  Medication Sig  . Ascorbic Acid (VITAMIN C) 1000 MG tablet Take 1,000 mg by mouth daily.  Marland Kitchen aspirin EC 81 MG EC tablet Take 1 tablet (81 mg total) by mouth daily.  Marland Kitchen atorvastatin (LIPITOR) 40 MG tablet Take 1 tablet (40 mg total) by mouth daily at 6 PM.  . carvedilol (COREG) 6.25 MG tablet Take 6.25 mg by mouth 2 (two) times daily with a meal.  . clopidogrel (PLAVIX) 75 MG tablet Take 1 tablet (75 mg total) by mouth daily with breakfast.  . cyclobenzaprine (FLEXERIL) 5 MG tablet Take 5 mg by mouth 3 (three) times daily.  . fluticasone (FLONASE) 50 MCG/ACT nasal spray Place 1 spray into both nostrils daily as needed (seasonal allergies).   . furosemide (LASIX) 40 MG tablet Take 40 mg by mouth daily.  Marland Kitchen glimepiride (AMARYL) 2 MG tablet Take 2 mg by mouth daily with breakfast.  . insulin glargine (LANTUS) 100 UNIT/ML injection Inject 40 Units into the skin at bedtime.  . isosorbide mononitrate (IMDUR) 30 MG 24 hr tablet Take 1 tablet (30 mg total) by mouth daily.  Marland Kitchen linagliptin (TRADJENTA) 5 MG TABS tablet Take 5 mg by mouth daily.  Marland Kitchen lisinopril (PRINIVIL,ZESTRIL) 30 MG tablet Take 30 mg by mouth daily.  . metolazone (ZAROXOLYN) 2.5 MG tablet Take Mon, Wed, and Fri 30 mins prior to Lasix or as directed. (Patient taking differently: Take 2.5 mg by mouth every Monday, Wednesday, and Friday. i 30 mins prior to Lasix or as directed.)  . nitroGLYCERIN (NITROSTAT) 0.4 MG SL tablet Place 1 tablet (0.4 mg total) under the tongue every 5 (five) minutes x 3 doses as needed for chest pain.  . pantoprazole (PROTONIX) 40 MG tablet Take 1  tablet (40 mg total) by mouth daily.  Vladimir Faster Glycol-Propyl Glycol (SYSTANE OP) Place 1 drop into both eyes daily as needed (dry eyes).  . polyethylene glycol (MIRALAX / GLYCOLAX) packet Take 17 g by mouth daily as needed (constipation). Mix in 8 oz liquid and drink  . traMADol (ULTRAM) 50 MG tablet Take 1 tablet (50 mg total) by mouth every 6 (six) hours as needed for severe pain.   No facility-administered encounter medications on file as of 12/18/2016.     Functional Status:  In your present state of health, do you have any difficulty performing the following activities: 11/21/2016 11/04/2016  Hearing? N -  Vision? N -  Difficulty concentrating or making decisions? Y -  Walking or climbing stairs? Y -  Comment - -  Dressing or bathing? Y -  Comment - -  Doing errands, shopping? Tempie Donning  Preparing Food and eating ? Y -  Using the Toilet? Y -  Do you have problems with loss of bowel control? N -  Managing your Medications? N -  Managing your Finances? N -  Housekeeping or managing your Housekeeping? N -  Some recent data might be hidden    Fall/Depression Screening:  PHQ 2/9 Scores 12/18/2016 11/21/2016 11/13/2015  PHQ - 2 Score 0 0 0    Assessment:  CSW met with patient and her husband at bedside at [facility] on 12/18/16. Anticipate dc  to home on Friday. Discussed HH and DME needs and CSW has spoken with SNF rep regarding these needs/requests.   Plan:  CSW will follow along while at SNF and assist with dc needs, support, etc.        [facility] CM Care Plan Problem One     Most Recent Value  Care Plan Problem One  SNF admission for rehab s/p hospitalization  Role Documenting the Problem One  Clinical Social Worker  Care Plan for Problem One  Active  [facility] Long Term Goal      [facility] Long Term Goal Start Date  -- Sudie Grumbling restarted due to readmission]  Interventions for Problem One Long Term Goal     [facility] CM Short Term Goal #1   Patient will participate in all therapies offered daily at SNF in the  next 30 days.  [facility] CM Short Term Goal #1 Start Date  12/12/16 Sudie Grumbling restarted due to a readmission]  Interventions for Short Term Goal #1  CSW discussed SNF coverage, benefits and goals for SNF rehab.  [facility] CM Short Term Goal #2       Interventions for Short Term Goal #2          Eduard Clos, MSW, [place] Worker  [place] [phone]

## 2016-12-20 ENCOUNTER — Telehealth: Payer: Self-pay | Admitting: Sports Medicine

## 2016-12-20 ENCOUNTER — Telehealth: Payer: Self-pay | Admitting: Cardiology

## 2016-12-20 NOTE — Telephone Encounter (Signed)
Received incoming records from [facility] for upcoming appointment on 01/09/17 @ 4:00pm with Dr. Antoine Poche. Records given to [facility] in Medical Records. 12/20/16 ab

## 2016-12-20 NOTE — Telephone Encounter (Signed)
There was a message left on back line from Imane's daughter concerned about her mom being discharged from SNF. The extent of medical chart can not be discussed until listed on DPR. If she has any other concern we will have to get permission from her mom to discuss care. -Dr. Marylene Land

## 2016-12-22 DIAGNOSIS — I252 Old myocardial infarction: Secondary | ICD-10-CM | POA: Diagnosis not present

## 2016-12-22 DIAGNOSIS — I350 Nonrheumatic aortic (valve) stenosis: Secondary | ICD-10-CM | POA: Diagnosis not present

## 2016-12-22 DIAGNOSIS — Z794 Long term (current) use of insulin: Secondary | ICD-10-CM | POA: Diagnosis not present

## 2016-12-22 DIAGNOSIS — E1142 Type 2 diabetes mellitus with diabetic polyneuropathy: Secondary | ICD-10-CM | POA: Diagnosis not present

## 2016-12-22 DIAGNOSIS — I5042 Chronic combined systolic (congestive) and diastolic (congestive) heart failure: Secondary | ICD-10-CM | POA: Diagnosis not present

## 2016-12-22 DIAGNOSIS — E1151 Type 2 diabetes mellitus with diabetic peripheral angiopathy without gangrene: Secondary | ICD-10-CM | POA: Diagnosis not present

## 2016-12-22 DIAGNOSIS — I2510 Atherosclerotic heart disease of native coronary artery without angina pectoris: Secondary | ICD-10-CM | POA: Diagnosis not present

## 2016-12-22 DIAGNOSIS — D631 Anemia in chronic kidney disease: Secondary | ICD-10-CM | POA: Diagnosis not present

## 2016-12-22 DIAGNOSIS — N183 Chronic kidney disease, stage 3 (moderate): Secondary | ICD-10-CM | POA: Diagnosis not present

## 2016-12-22 DIAGNOSIS — E1122 Type 2 diabetes mellitus with diabetic chronic kidney disease: Secondary | ICD-10-CM | POA: Diagnosis not present

## 2016-12-22 DIAGNOSIS — E11610 Type 2 diabetes mellitus with diabetic neuropathic arthropathy: Secondary | ICD-10-CM | POA: Diagnosis not present

## 2016-12-22 DIAGNOSIS — I130 Hypertensive heart and chronic kidney disease with heart failure and stage 1 through stage 4 chronic kidney disease, or unspecified chronic kidney disease: Secondary | ICD-10-CM | POA: Diagnosis not present

## 2016-12-22 DIAGNOSIS — Z8631 Personal history of diabetic foot ulcer: Secondary | ICD-10-CM | POA: Diagnosis not present

## 2016-12-22 DIAGNOSIS — Z7982 Long term (current) use of aspirin: Secondary | ICD-10-CM | POA: Diagnosis not present

## 2016-12-22 DIAGNOSIS — Z7951 Long term (current) use of inhaled steroids: Secondary | ICD-10-CM | POA: Diagnosis not present

## 2016-12-23 ENCOUNTER — Other Ambulatory Visit: Payer: Self-pay | Admitting: *Deleted

## 2016-12-23 ENCOUNTER — Encounter: Payer: Self-pay | Admitting: Cardiology

## 2016-12-23 ENCOUNTER — Other Ambulatory Visit: Payer: Self-pay

## 2016-12-23 DIAGNOSIS — Z794 Long term (current) use of insulin: Secondary | ICD-10-CM | POA: Diagnosis not present

## 2016-12-23 DIAGNOSIS — I5042 Chronic combined systolic (congestive) and diastolic (congestive) heart failure: Secondary | ICD-10-CM | POA: Diagnosis not present

## 2016-12-23 DIAGNOSIS — I2510 Atherosclerotic heart disease of native coronary artery without angina pectoris: Secondary | ICD-10-CM | POA: Diagnosis not present

## 2016-12-23 DIAGNOSIS — D631 Anemia in chronic kidney disease: Secondary | ICD-10-CM | POA: Diagnosis not present

## 2016-12-23 DIAGNOSIS — I252 Old myocardial infarction: Secondary | ICD-10-CM | POA: Diagnosis not present

## 2016-12-23 DIAGNOSIS — I350 Nonrheumatic aortic (valve) stenosis: Secondary | ICD-10-CM | POA: Diagnosis not present

## 2016-12-23 DIAGNOSIS — E1122 Type 2 diabetes mellitus with diabetic chronic kidney disease: Secondary | ICD-10-CM | POA: Diagnosis not present

## 2016-12-23 DIAGNOSIS — N183 Chronic kidney disease, stage 3 (moderate): Secondary | ICD-10-CM | POA: Diagnosis not present

## 2016-12-23 DIAGNOSIS — E1151 Type 2 diabetes mellitus with diabetic peripheral angiopathy without gangrene: Secondary | ICD-10-CM | POA: Diagnosis not present

## 2016-12-23 DIAGNOSIS — E1142 Type 2 diabetes mellitus with diabetic polyneuropathy: Secondary | ICD-10-CM | POA: Diagnosis not present

## 2016-12-23 DIAGNOSIS — Z8631 Personal history of diabetic foot ulcer: Secondary | ICD-10-CM | POA: Diagnosis not present

## 2016-12-23 DIAGNOSIS — Z7951 Long term (current) use of inhaled steroids: Secondary | ICD-10-CM | POA: Diagnosis not present

## 2016-12-23 DIAGNOSIS — E11610 Type 2 diabetes mellitus with diabetic neuropathic arthropathy: Secondary | ICD-10-CM | POA: Diagnosis not present

## 2016-12-23 DIAGNOSIS — Z7982 Long term (current) use of aspirin: Secondary | ICD-10-CM | POA: Diagnosis not present

## 2016-12-23 DIAGNOSIS — I130 Hypertensive heart and chronic kidney disease with heart failure and stage 1 through stage 4 chronic kidney disease, or unspecified chronic kidney disease: Secondary | ICD-10-CM | POA: Diagnosis not present

## 2016-12-23 NOTE — Patient Outreach (Signed)
[facility]) Care Management  12/23/2016  Emily Miranda 26-Dec-1940 [id number]   CSW spoke with patient by phone who reports she is home from SNF since Friday. "I am waiting on people to come from the home health agency".  She also is awaiting a "special shoe" to be made for her so she can keep weight off her wound on the leg that is swollen and has been infected.  Patient reports she has adequate care, transportation, food, etc and that she was able to get her RX's filled upon release from SNF. She is open to [place] follow up and [facility] CSW has updated Carlos Levering, [facility] RNCM, of dc to home.  CSW will plan a f/u call to patient later this week for follow up with home transition and to determine if [facility] CSW needs exist for continued following.     [facility] CM Care Plan Problem One     Most Recent Value  Care Plan Problem One  SNF admission for rehab s/p hospitalization  Role Documenting the Problem One  Clinical Social Worker  Care Plan for Problem One  Active  [facility] Long Term Goal      [facility] Long Term Goal Start Date  -- Sudie Grumbling restarted due to readmission]  Interventions for Problem One Long Term Goal     [facility] CM Short Term Goal #1   Patient will participate in all therapies offered daily at SNF in the next 30 days.  [facility] CM Short Term Goal #1 Start Date  12/12/16 Sudie Grumbling restarted due to a readmission]  [facility] CM Short Term Goal #1 Met Date  12/23/16  Interventions for Short Term Goal #1  CSW discussed SNF coverage, benefits and goals for SNF rehab.  [facility] CM Short Term Goal #2       [facility] CM Short Term Goal #2 Start Date  -- Sudie Grumbling restarted due to a readmission]  Interventions for Short Term Goal #2        Eduard Clos, MSW, [place] Worker  [place] [phone]

## 2016-12-23 NOTE — Patient Outreach (Signed)
Transition of care: Admitted to [facility] from 12/04/2016-12/06/2016 for pain and weakness. Discharged to [facility] for rehab and PT. 12/20/2016  Discharged home with home health.   Placed call to patient and explained reason for call. Patient reports that she is doing well. Reports that she continues to be non weight bearing on the right foot. Reports [facility] PT and RN is seeing her. Reports that she is unable to perform lower leg exercises until she is fitted for a non weight bearing shoe.  Reports she is taking all her medications as prescribed. Denies any problems with medications.  Follow up: Patient has planned follow up with primary MD in 2 weeks, podiatry in 10 days and cardiology in 2 weeks.  Reports she is not able to weigh daily due to being unable to stand. Reports her legs are very swollen and she is taking an antibiotic for this.  Last weight at the rehab center was 144 pounds.   CBG: reports CBG's are up and down. Reports todays reading of 108.  Reports that she is eating well. States that she has been eating a snack during the night.   Offered home visit and patient is not ready to make an appointment. Request a phone call next week to determine best day.   PLAN: Follow up phone call in 1 week. Encouraged patient to call sooner if needed.  Rowe Pavy, RN, BSN, CEN [facility] [phone]

## 2016-12-24 DIAGNOSIS — I5042 Chronic combined systolic (congestive) and diastolic (congestive) heart failure: Secondary | ICD-10-CM | POA: Diagnosis not present

## 2016-12-24 DIAGNOSIS — I350 Nonrheumatic aortic (valve) stenosis: Secondary | ICD-10-CM | POA: Diagnosis not present

## 2016-12-24 DIAGNOSIS — Z7951 Long term (current) use of inhaled steroids: Secondary | ICD-10-CM | POA: Diagnosis not present

## 2016-12-24 DIAGNOSIS — N183 Chronic kidney disease, stage 3 (moderate): Secondary | ICD-10-CM | POA: Diagnosis not present

## 2016-12-24 DIAGNOSIS — Z794 Long term (current) use of insulin: Secondary | ICD-10-CM | POA: Diagnosis not present

## 2016-12-24 DIAGNOSIS — E1151 Type 2 diabetes mellitus with diabetic peripheral angiopathy without gangrene: Secondary | ICD-10-CM | POA: Diagnosis not present

## 2016-12-24 DIAGNOSIS — D631 Anemia in chronic kidney disease: Secondary | ICD-10-CM | POA: Diagnosis not present

## 2016-12-24 DIAGNOSIS — I2510 Atherosclerotic heart disease of native coronary artery without angina pectoris: Secondary | ICD-10-CM | POA: Diagnosis not present

## 2016-12-24 DIAGNOSIS — Z8631 Personal history of diabetic foot ulcer: Secondary | ICD-10-CM | POA: Diagnosis not present

## 2016-12-24 DIAGNOSIS — I252 Old myocardial infarction: Secondary | ICD-10-CM | POA: Diagnosis not present

## 2016-12-24 DIAGNOSIS — Z7982 Long term (current) use of aspirin: Secondary | ICD-10-CM | POA: Diagnosis not present

## 2016-12-24 DIAGNOSIS — E1142 Type 2 diabetes mellitus with diabetic polyneuropathy: Secondary | ICD-10-CM | POA: Diagnosis not present

## 2016-12-24 DIAGNOSIS — E1122 Type 2 diabetes mellitus with diabetic chronic kidney disease: Secondary | ICD-10-CM | POA: Diagnosis not present

## 2016-12-24 DIAGNOSIS — E11610 Type 2 diabetes mellitus with diabetic neuropathic arthropathy: Secondary | ICD-10-CM | POA: Diagnosis not present

## 2016-12-24 DIAGNOSIS — I130 Hypertensive heart and chronic kidney disease with heart failure and stage 1 through stage 4 chronic kidney disease, or unspecified chronic kidney disease: Secondary | ICD-10-CM | POA: Diagnosis not present

## 2016-12-25 DIAGNOSIS — Z794 Long term (current) use of insulin: Secondary | ICD-10-CM | POA: Diagnosis not present

## 2016-12-25 DIAGNOSIS — I130 Hypertensive heart and chronic kidney disease with heart failure and stage 1 through stage 4 chronic kidney disease, or unspecified chronic kidney disease: Secondary | ICD-10-CM | POA: Diagnosis not present

## 2016-12-25 DIAGNOSIS — E1122 Type 2 diabetes mellitus with diabetic chronic kidney disease: Secondary | ICD-10-CM | POA: Diagnosis not present

## 2016-12-25 DIAGNOSIS — I252 Old myocardial infarction: Secondary | ICD-10-CM | POA: Diagnosis not present

## 2016-12-25 DIAGNOSIS — E1151 Type 2 diabetes mellitus with diabetic peripheral angiopathy without gangrene: Secondary | ICD-10-CM | POA: Diagnosis not present

## 2016-12-25 DIAGNOSIS — N183 Chronic kidney disease, stage 3 (moderate): Secondary | ICD-10-CM | POA: Diagnosis not present

## 2016-12-25 DIAGNOSIS — Z7982 Long term (current) use of aspirin: Secondary | ICD-10-CM | POA: Diagnosis not present

## 2016-12-25 DIAGNOSIS — D631 Anemia in chronic kidney disease: Secondary | ICD-10-CM | POA: Diagnosis not present

## 2016-12-25 DIAGNOSIS — I350 Nonrheumatic aortic (valve) stenosis: Secondary | ICD-10-CM | POA: Diagnosis not present

## 2016-12-25 DIAGNOSIS — Z8631 Personal history of diabetic foot ulcer: Secondary | ICD-10-CM | POA: Diagnosis not present

## 2016-12-25 DIAGNOSIS — Z7951 Long term (current) use of inhaled steroids: Secondary | ICD-10-CM | POA: Diagnosis not present

## 2016-12-25 DIAGNOSIS — I2510 Atherosclerotic heart disease of native coronary artery without angina pectoris: Secondary | ICD-10-CM | POA: Diagnosis not present

## 2016-12-25 DIAGNOSIS — I5042 Chronic combined systolic (congestive) and diastolic (congestive) heart failure: Secondary | ICD-10-CM | POA: Diagnosis not present

## 2016-12-25 DIAGNOSIS — E11610 Type 2 diabetes mellitus with diabetic neuropathic arthropathy: Secondary | ICD-10-CM | POA: Diagnosis not present

## 2016-12-25 DIAGNOSIS — E1142 Type 2 diabetes mellitus with diabetic polyneuropathy: Secondary | ICD-10-CM | POA: Diagnosis not present

## 2016-12-26 ENCOUNTER — Other Ambulatory Visit: Payer: Self-pay | Admitting: *Deleted

## 2016-12-26 DIAGNOSIS — M79671 Pain in right foot: Secondary | ICD-10-CM | POA: Diagnosis not present

## 2016-12-26 DIAGNOSIS — L97412 Non-pressure chronic ulcer of right heel and midfoot with fat layer exposed: Secondary | ICD-10-CM | POA: Diagnosis not present

## 2016-12-26 DIAGNOSIS — M14671 Charcot's joint, right ankle and foot: Secondary | ICD-10-CM | POA: Diagnosis not present

## 2016-12-26 DIAGNOSIS — E11621 Type 2 diabetes mellitus with foot ulcer: Secondary | ICD-10-CM | POA: Diagnosis not present

## 2016-12-26 NOTE — Patient Outreach (Signed)
[facility]) Care Management  [facility] Social Work  12/26/2016  Emily Miranda 03-Jul-1940 [id number]  Subjective:  Patient at home after SNF d/c and reports doing well.  Objective: CSW to assist patient with commuity based resources to aide in her well-being, quality of life and overall safety/needs.    Encounter Medications:  Outpatient Encounter Prescriptions as of 12/26/2016  Medication Sig  . Ascorbic Acid (VITAMIN C) 1000 MG tablet Take 1,000 mg by mouth daily.  Marland Kitchen aspirin EC 81 MG EC tablet Take 1 tablet (81 mg total) by mouth daily.  Marland Kitchen atorvastatin (LIPITOR) 40 MG tablet Take 1 tablet (40 mg total) by mouth daily at 6 PM.  . carvedilol (COREG) 6.25 MG tablet Take 6.25 mg by mouth 2 (two) times daily with a meal.  . clopidogrel (PLAVIX) 75 MG tablet Take 1 tablet (75 mg total) by mouth daily with breakfast.  . cyclobenzaprine (FLEXERIL) 5 MG tablet Take 5 mg by mouth 3 (three) times daily.  . fluticasone (FLONASE) 50 MCG/ACT nasal spray Place 1 spray into both nostrils daily as needed (seasonal allergies).   . furosemide (LASIX) 40 MG tablet Take 40 mg by mouth daily.  Marland Kitchen glimepiride (AMARYL) 2 MG tablet Take 2 mg by mouth daily with breakfast.  . insulin glargine (LANTUS) 100 UNIT/ML injection Inject 40 Units into the skin at bedtime.  . isosorbide mononitrate (IMDUR) 30 MG 24 hr tablet Take 1 tablet (30 mg total) by mouth daily.  Marland Kitchen linagliptin (TRADJENTA) 5 MG TABS tablet Take 5 mg by mouth daily.  Marland Kitchen lisinopril (PRINIVIL,ZESTRIL) 30 MG tablet Take 30 mg by mouth daily.  . metolazone (ZAROXOLYN) 2.5 MG tablet Take Mon, Wed, and Fri 30 mins prior to Lasix or as directed. (Patient taking differently: Take 2.5 mg by mouth every Monday, Wednesday, and Friday. i 30 mins prior to Lasix or as directed.)  . nitroGLYCERIN (NITROSTAT) 0.4 MG SL tablet Place 1 tablet (0.4 mg total) under the tongue every 5 (five) minutes x 3 doses as needed for chest pain.  . pantoprazole  (PROTONIX) 40 MG tablet Take 1 tablet (40 mg total) by mouth daily.  Vladimir Faster Glycol-Propyl Glycol (SYSTANE OP) Place 1 drop into both eyes daily as needed (dry eyes).  . polyethylene glycol (MIRALAX / GLYCOLAX) packet Take 17 g by mouth daily as needed (constipation). Mix in 8 oz liquid and drink  . traMADol (ULTRAM) 50 MG tablet Take 1 tablet (50 mg total) by mouth every 6 (six) hours as needed for severe pain.   No facility-administered encounter medications on file as of 12/26/2016.     Functional Status:  In your present state of health, do you have any difficulty performing the following activities: 11/21/2016 11/04/2016  Hearing? N -  Vision? N -  Difficulty concentrating or making decisions? Y -  Walking or climbing stairs? Y -  Comment - -  Dressing or bathing? Y -  Comment - -  Doing errands, shopping? Tempie Donning  Preparing Food and eating ? Y -  Using the Toilet? Y -  Do you have problems with loss of bowel control? N -  Managing your Medications? N -  Managing your Finances? N -  Housekeeping or managing your Housekeeping? N -  Some recent data might be hidden    Fall/Depression Screening:  PHQ 2/9 Scores 12/18/2016 11/21/2016 11/13/2015  PHQ - 2 Score 0 0 0    Assessment: CSW spoke to patient on the phone who is home  and reports she is doing well. She has [place] services involved and denies any further   Plan: CSW discussed plans to close CSW referral as goals have been met and no further needs identified. Patient agrees to plans and will contact CSW or PCP if needs arise.  CSW will advise [facility] team and PCP of plans to close CSW referral.      [facility] CM Care Plan Problem One     Most Recent Value  Care Plan Problem One  SNF admission for rehab s/p hospitalization  Role Documenting the Problem One  Clinical Social Worker  Care Plan for Problem One  Active  [facility]N Long Term Goal      [facility] Long Term Goal Start Date  -- Sudie Grumbling restarted due to readmission]  Interventions for Problem  One Long Term Goal     [facility] CM Short Term Goal #1   Patient will participate in all therapies offered daily at SNF in the next 30 days.  [facility] CM Short Term Goal #1 Start Date  12/12/16 Sudie Grumbling restarted due to a readmission]  [facility] CM Short Term Goal #1 Met Date  12/23/16  Interventions for Short Term Goal #1  CSW discussed SNF coverage, benefits and goals for SNF rehab.  [facility] CM Short Term Goal #2       [facility] CM Short Term Goal #2 Start Date  -- Sudie Grumbling restarted due to a readmission]  Interventions for Short Term Goal #2        Eduard Clos, MSW, [place] Worker  [place] [phone]

## 2016-12-27 ENCOUNTER — Encounter (INDEPENDENT_AMBULATORY_CARE_PROVIDER_SITE_OTHER): Payer: Self-pay

## 2016-12-27 ENCOUNTER — Encounter: Payer: Self-pay | Admitting: Sports Medicine

## 2016-12-27 ENCOUNTER — Ambulatory Visit (INDEPENDENT_AMBULATORY_CARE_PROVIDER_SITE_OTHER): Payer: Medicare Other | Admitting: Sports Medicine

## 2016-12-27 DIAGNOSIS — M79674 Pain in right toe(s): Secondary | ICD-10-CM | POA: Diagnosis not present

## 2016-12-27 DIAGNOSIS — E1142 Type 2 diabetes mellitus with diabetic polyneuropathy: Secondary | ICD-10-CM | POA: Diagnosis not present

## 2016-12-27 DIAGNOSIS — B351 Tinea unguium: Secondary | ICD-10-CM

## 2016-12-27 DIAGNOSIS — M79675 Pain in left toe(s): Secondary | ICD-10-CM

## 2016-12-27 DIAGNOSIS — I739 Peripheral vascular disease, unspecified: Secondary | ICD-10-CM

## 2016-12-27 DIAGNOSIS — M7989 Other specified soft tissue disorders: Secondary | ICD-10-CM

## 2016-12-27 DIAGNOSIS — I890 Lymphedema, not elsewhere classified: Secondary | ICD-10-CM

## 2016-12-27 MED ORDER — CEPHALEXIN 500 MG PO CAPS: 500.0000 mg | ORAL_CAPSULE | Freq: Three times a day (TID) | ORAL | 0 refills | Status: DC

## 2016-12-27 NOTE — Progress Notes (Addendum)
Subjective: Emily Miranda is a 76 y.o. female patient seen today in office for swelling in both legs. States that she is out of SNF and is back home PT is not letting her do much with her legs. States that she was started on Keflex this past week for possible cellulitis in her right leg. States that she also wants her nails trimmed and to be evaluated for diabetic shoes. Patient is assisted by son. No other issues noted.   Patient Active Problem List   Diagnosis Date Noted  . Edema 11/24/2016  . Chronic combined systolic and diastolic heart failure (HCC) 11/09/2016  . Unstable angina (HCC) 11/04/2016  . Charcot foot due to diabetes mellitus (HCC) 10/31/2016  . Chronic anemia 10/31/2016  . Mixed hyperlipidemia   . PVD (peripheral vascular disease) (HCC)   . CAD S/P percutaneous coronary angioplasty   . Non-ST elevation (NSTEMI) myocardial infarction (HCC) 10/06/2016  . NSTEMI (non-ST elevated myocardial infarction) (HCC) 10/06/2016  . CKD (chronic kidney disease), stage III 08/27/2016  . Chronic diabetic ulcer of right foot  08/27/2016  . Loss of weight   . Acute combined systolic and diastolic congestive heart failure (HCC)   . Shortness of breath 07/29/2016  . Wound infection 11/13/2015  . Abdominal pain, RLQ (right lower quadrant) -chronic since 2009 06/02/2013  . Constipation, chronic 06/02/2013  . SUI (stress urinary incontinence, female) 06/02/2013  . Recurrent UTI (urinary tract infection) 06/02/2013  . Hypertensive cardiomyopathy, with heart failure (HCC)   . CHEST PAIN 11/27/2009  . OVERWEIGHT 11/28/2008  . DM type 2 causing CKD stage 3 (HCC) 11/25/2008  . Mild aortic stenosis 11/25/2008  . PANCREATITIS, HX OF 11/25/2008    Current Outpatient Prescriptions on File Prior to Visit  Medication Sig Dispense Refill  . Ascorbic Acid (VITAMIN C) 1000 MG tablet Take 1,000 mg by mouth daily.    Marland Kitchen aspirin EC 81 MG EC tablet Take 1 tablet (81 mg total) by mouth daily. 30  tablet 0  . atorvastatin (LIPITOR) 40 MG tablet Take 1 tablet (40 mg total) by mouth daily at 6 PM. 30 tablet 3  . carvedilol (COREG) 6.25 MG tablet Take 6.25 mg by mouth 2 (two) times daily with a meal.    . clopidogrel (PLAVIX) 75 MG tablet Take 1 tablet (75 mg total) by mouth daily with breakfast. 30 tablet 6  . cyclobenzaprine (FLEXERIL) 5 MG tablet Take 5 mg by mouth 3 (three) times daily.  0  . fluticasone (FLONASE) 50 MCG/ACT nasal spray Place 1 spray into both nostrils daily as needed (seasonal allergies).     . furosemide (LASIX) 40 MG tablet Take 40 mg by mouth daily.    Marland Kitchen glimepiride (AMARYL) 2 MG tablet Take 2 mg by mouth daily with breakfast.    . insulin glargine (LANTUS) 100 UNIT/ML injection Inject 40 Units into the skin at bedtime.    . isosorbide mononitrate (IMDUR) 30 MG 24 hr tablet Take 1 tablet (30 mg total) by mouth daily. 30 tablet 6  . linagliptin (TRADJENTA) 5 MG TABS tablet Take 5 mg by mouth daily.    Marland Kitchen lisinopril (PRINIVIL,ZESTRIL) 30 MG tablet Take 30 mg by mouth daily.    . metolazone (ZAROXOLYN) 2.5 MG tablet Take Mon, Wed, and Fri 30 mins prior to Lasix or as directed. (Patient taking differently: Take 2.5 mg by mouth every Monday, Wednesday, and Friday. i 30 mins prior to Lasix or as directed.) 15 tablet 3  . nitroGLYCERIN (NITROSTAT) 0.4  MG SL tablet Place 1 tablet (0.4 mg total) under the tongue every 5 (five) minutes x 3 doses as needed for chest pain. 25 tablet 3  . pantoprazole (PROTONIX) 40 MG tablet Take 1 tablet (40 mg total) by mouth daily. 30 tablet 6  . Polyethyl Glycol-Propyl Glycol (SYSTANE OP) Place 1 drop into both eyes daily as needed (dry eyes).    . polyethylene glycol (MIRALAX / GLYCOLAX) packet Take 17 g by mouth daily as needed (constipation). Mix in 8 oz liquid and drink    . traMADol (ULTRAM) 50 MG tablet Take 1 tablet (50 mg total) by mouth every 6 (six) hours as needed for severe pain. 15 tablet 0   No current facility-administered  medications on file prior to visit.     Allergies  Allergen Reactions  . Levofloxacin Nausea And Vomiting and Other (See Comments)     Confusion  . Amlodipine Other (See Comments)    Localized edema  . Gabapentin Other (See Comments)    Disoriented, no strength in legs  . Hydrochlorothiazide Other (See Comments)    Hot and disoriented  . Ibuprofen Other (See Comments)    Affected kidneys - stopped flow of urine Reaction to Advil  . Metformin And Related Other (See Comments)    Affected kidneys - stopped flow of urine  . Simvastatin Other (See Comments)    Chest pain    Objective: There were no vitals filed for this visit.  General: No acute distress, AAOx3  Focused physical exam No open lesions bilateral, Nails 2-5 bilateral are mildly elongated and thickened consistent with onychomycosis, there is no nail at bilateral hallux from previous removal, there is dependent rubor or right>left leg with  No other  acute signs of infection noted, There is no pain with calf palpation bilateral with 1+ edema bilateral with chronic trophic skin changes right>left, Capillary fill time <3 seconds in all digits,  + Charcot foot.   Assessment and Plan:  Problem List Items Addressed This Visit      Cardiovascular and Mediastinum   PVD (peripheral vascular disease) (HCC)    Other Visit Diagnoses    Swelling of calf    -  Primary   Relevant Medications   cephALEXin (KEFLEX) 500 MG capsule   Diabetic polyneuropathy associated with type 2 diabetes mellitus (HCC)       Pain due to onychomycosis of toenails of both feet       Relevant Medications   cephALEXin (KEFLEX) 500 MG capsule   Lymphedema         -Patient seen and evaluated -Mechanically debrided nails x 8 using sterile nail nipper without incident  -Refilled Keflex for preventative measures for dependent rubor -Rx Venous reflux testing patient may benefit from zippered compression stockings or Normatech compression pump; will need  cardiology clearance after reflux test is completed  -Patient to see Pain management for further recs  -Continue with topical pain rub of lidocaine to right as needed for pain as well -Continue with lasix as Rx by PCP and cardiology recs  -Continue with transport wheelchair -Recommend continue with home aid and home PT -Safe step diabetic shoe order form was completed; office to contact primary care for approval / certification;  Office to arrange shoe fitting and dispensing. -Return for diabetic shoe measurements when called or sooner if pain or symptoms worsen.  Addendum: Patient has been dealing with swelling and edema in legs >6 weeks with trophic skin changes consistent with hyperpigmentation and hyperplasia,  with history of ulcer/skin break down. Patient has tried surgitube compression sleeves and elevation with no improvement. Patient will greatly benefit from Normatec compression device.  Secondary Addendum: Patient has tried home PT will send supplemental notes for the home exercises with Lymphedema.    Asencion Islam, DPM

## 2016-12-30 ENCOUNTER — Other Ambulatory Visit: Payer: Self-pay

## 2016-12-30 ENCOUNTER — Other Ambulatory Visit: Payer: Self-pay | Admitting: Family Medicine

## 2016-12-30 NOTE — Patient Outreach (Signed)
Transition of care: Placed call to patient and spoke with husband because patient is asleep. Husband reports that patient did not sleep well last night due to pain in legs. Reports otherwise patient is doing okay. Reports that patient has a follow up appointment with foot doctor on Friday.  States she also has an MD appointment tomorrow.   PLAN: Offered home visit or 01/01/2017 at 1pm and husband has accepted.  Rowe Pavy, RN, BSN, CEN [facility] [employer] [phone]

## 2016-12-31 DIAGNOSIS — D631 Anemia in chronic kidney disease: Secondary | ICD-10-CM

## 2016-12-31 DIAGNOSIS — I350 Nonrheumatic aortic (valve) stenosis: Secondary | ICD-10-CM | POA: Diagnosis not present

## 2016-12-31 DIAGNOSIS — Z7982 Long term (current) use of aspirin: Secondary | ICD-10-CM | POA: Diagnosis not present

## 2016-12-31 DIAGNOSIS — I130 Hypertensive heart and chronic kidney disease with heart failure and stage 1 through stage 4 chronic kidney disease, or unspecified chronic kidney disease: Secondary | ICD-10-CM | POA: Diagnosis not present

## 2016-12-31 DIAGNOSIS — I252 Old myocardial infarction: Secondary | ICD-10-CM | POA: Diagnosis not present

## 2016-12-31 DIAGNOSIS — Z7951 Long term (current) use of inhaled steroids: Secondary | ICD-10-CM | POA: Diagnosis not present

## 2016-12-31 DIAGNOSIS — I5042 Chronic combined systolic (congestive) and diastolic (congestive) heart failure: Secondary | ICD-10-CM | POA: Diagnosis not present

## 2016-12-31 DIAGNOSIS — E11610 Type 2 diabetes mellitus with diabetic neuropathic arthropathy: Secondary | ICD-10-CM | POA: Diagnosis not present

## 2016-12-31 DIAGNOSIS — N183 Chronic kidney disease, stage 3 (moderate): Secondary | ICD-10-CM | POA: Diagnosis not present

## 2016-12-31 DIAGNOSIS — Z794 Long term (current) use of insulin: Secondary | ICD-10-CM | POA: Diagnosis not present

## 2016-12-31 DIAGNOSIS — E1151 Type 2 diabetes mellitus with diabetic peripheral angiopathy without gangrene: Secondary | ICD-10-CM | POA: Diagnosis not present

## 2016-12-31 DIAGNOSIS — E1142 Type 2 diabetes mellitus with diabetic polyneuropathy: Secondary | ICD-10-CM | POA: Diagnosis not present

## 2016-12-31 DIAGNOSIS — I2510 Atherosclerotic heart disease of native coronary artery without angina pectoris: Secondary | ICD-10-CM | POA: Diagnosis not present

## 2016-12-31 DIAGNOSIS — Z8631 Personal history of diabetic foot ulcer: Secondary | ICD-10-CM | POA: Diagnosis not present

## 2016-12-31 DIAGNOSIS — N189 Chronic kidney disease, unspecified: Secondary | ICD-10-CM

## 2016-12-31 DIAGNOSIS — E1122 Type 2 diabetes mellitus with diabetic chronic kidney disease: Secondary | ICD-10-CM | POA: Diagnosis not present

## 2016-12-31 NOTE — Telephone Encounter (Addendum)
-----   Message from Asencion Islam, [place] sent at 12/27/2016  5:33 PM EDT ----- Regarding: Venous reflux Chronic swelling in right>left leg. Check for venous reflux in order to consider for need for compression stockings -Dr. Marylene Land.12/31/2016-Faxed orders to [facility].

## 2017-01-01 ENCOUNTER — Other Ambulatory Visit: Payer: Self-pay

## 2017-01-01 NOTE — Patient Outreach (Signed)
[facility]) Care Management   01/01/2017  [name] 1941/04/15 [id number]  Emily Miranda is an 76 y.o. female Transition of care visit. Subjective: Patient reports that she continues to have severe right leg and foot pain.  Patient reports swelling for 3 months. Patient was discharged from [facility] on August 12th.  Patient reports that she was not able to get therapy on legs due to chronic foot ulcer.  Patient reports that her hemoglobin is low and she will start a new shot to increase hemoglobin.   No recent falls. Patient is now using a walker.  Previously patient was using a rolling walker with a seat and was sitting on seat to go to bathroom. Patient is unable to stand to weigh daily.    Patient reports CBG range 150-200 daily. Reports recent decrease in insulin from 40 to 20 units due to hypoglycemia. Patient reports she is eating more sweets right now.   Patient reports Dr. Alcario Drought office cancelled follow up appointment.  Patient reports that she will call and make a follow up appointment.  Patient saw Dr. Marylene Land and is planning to start compression hose machine for 1 hour per day once approved by insurance and patient is fitted.  Patient states that she is waiting to be fitted for DM shoes.   Patient reports that when she gets the compression machine and shoes then she will attempt to begin ambulation. Reports eating a bedtime snack daily.   Objective:  CBG 168. Awake and alert. Upon arrival of home visit patient laying with legs propped on couch.  Right low shin red and swelling noted from mid thigh to toes on the right and knee to toes on the left.  Vitals:   01/01/17 1309  BP: 138/76  Pulse: 81  Resp: 20  SpO2: 97%  Weight: 144 lb (65.3 kg)   Review of Systems  HENT: Negative.   Eyes: Negative.   Respiratory: Negative.   Cardiovascular: Positive for leg swelling. Negative for chest pain.  Gastrointestinal: Positive for constipation.   Genitourinary: Negative.   Musculoskeletal: Positive for joint pain.  Skin:       Reports swelling and redness to the right leg  Neurological: Positive for weakness.  Endo/Heme/Allergies: Bruises/bleeds easily.  Psychiatric/Behavioral: Negative.     Physical Exam  Constitutional: She is oriented to person, place, and time. She appears well-developed and well-nourished.  Cardiovascular: Normal rate, normal heart sounds and intact distal pulses.   Respiratory: Effort normal and breath sounds normal.  GI: Soft. Bowel sounds are normal.  Musculoskeletal: She exhibits edema and tenderness.  Neurological: She is alert and oriented to person, place, and time.  Skin: Skin is warm and dry.  Psychiatric: She has a normal mood and affect. Her behavior is normal. Judgment and thought content normal.    Encounter Medications:   Outpatient Encounter Prescriptions as of 01/01/2017  Medication Sig  . Ascorbic Acid (VITAMIN C) 1000 MG tablet Take 1,000 mg by mouth daily.  Marland Kitchen aspirin EC 81 MG EC tablet Take 1 tablet (81 mg total) by mouth daily.  Marland Kitchen atorvastatin (LIPITOR) 40 MG tablet Take 1 tablet (40 mg total) by mouth daily at 6 PM.  . carvedilol (COREG) 6.25 MG tablet Take 6.25 mg by mouth 2 (two) times daily with a meal.  . cephALEXin (KEFLEX) 500 MG capsule Take 1 capsule (500 mg total) by mouth 3 (three) times daily.  . clopidogrel (PLAVIX) 75 MG tablet Take 1 tablet (75  mg total) by mouth daily with breakfast.  . cyclobenzaprine (FLEXERIL) 5 MG tablet Take 5 mg by mouth 3 (three) times daily.  . fluticasone (FLONASE) 50 MCG/ACT nasal spray Place 1 spray into both nostrils daily as needed (seasonal allergies).   . furosemide (LASIX) 40 MG tablet Take 20 mg by mouth daily.   Marland Kitchen glimepiride (AMARYL) 2 MG tablet Take 2 mg by mouth daily with breakfast.  . insulin glargine (LANTUS) 100 UNIT/ML injection Inject 20 Units into the skin at bedtime.   . isosorbide mononitrate (IMDUR) 30 MG 24 hr tablet  Take 1 tablet (30 mg total) by mouth daily.  Marland Kitchen linagliptin (TRADJENTA) 5 MG TABS tablet Take 5 mg by mouth daily.  Marland Kitchen lisinopril (PRINIVIL,ZESTRIL) 30 MG tablet Take 30 mg by mouth daily.  . nitroGLYCERIN (NITROSTAT) 0.4 MG SL tablet Place 1 tablet (0.4 mg total) under the tongue every 5 (five) minutes x 3 doses as needed for chest pain.  Marland Kitchen oxyCODONE-acetaminophen (PERCOCET/ROXICET) 5-325 MG tablet Take 1 tablet by mouth every 4 (four) hours as needed.  . pantoprazole (PROTONIX) 40 MG tablet Take 1 tablet (40 mg total) by mouth daily.  Bertram Gala Glycol-Propyl Glycol (SYSTANE OP) Place 1 drop into both eyes daily as needed (dry eyes).  . polyethylene glycol (MIRALAX / GLYCOLAX) packet Take 17 g by mouth daily as needed (constipation). Mix in 8 oz liquid and drink  . traMADol (ULTRAM) 50 MG tablet Take 1 tablet (50 mg total) by mouth every 6 (six) hours as needed for severe pain.  . metolazone (ZAROXOLYN) 2.5 MG tablet Take Mon, Wed, and Fri 30 mins prior to Lasix or as directed. (Patient not taking: Reported on 01/01/2017)   No facility-administered encounter medications on file as of 01/01/2017.     Functional Status:   In your present state of health, do you have any difficulty performing the following activities: 11/21/2016 11/04/2016  Hearing? N -  Vision? N -  Difficulty concentrating or making decisions? Y -  Walking or climbing stairs? Y -  Comment - -  Dressing or bathing? Y -  Comment - -  Doing errands, shopping? [name]  Preparing Food and eating ? Y -  Using the Toilet? Y -  Do you have problems with loss of bowel control? N -  Managing your Medications? N -  Managing your Finances? N -  Housekeeping or managing your Housekeeping? N -  Some recent data might be hidden    Fall/Depression Screening:    Fall Risk  12/18/2016 11/21/2016 11/13/2015  Falls in the past year? No Yes No  Number falls in past yr: - 1 -  Injury with Fall? - Yes -  Comment - knot on back of head -  Risk  Factor Category  - High Fall Risk -  Risk for fall due to : Impaired balance/gait;Impaired mobility History of fall(s);Impaired mobility -  Follow up - Falls evaluation completed;Falls prevention discussed -   PHQ 2/9 Scores 12/18/2016 11/21/2016 11/13/2015  PHQ - 2 Score 0 0 0    Assessment:   (1) Active in transition of care program.  Recent admission for weakness and leg swelling.  (2) husband is caregiver. (3) patient has completed advance directive packet that was provided at last home visit. (4) only able to exercise upper extremities. (5) hospital follow up appointment cancelled by MD office.   Plan:  (1) will contact patient weekly for transition of care. (2) encouraged patient to be as independent as  she can. (3) encouraged patient to share advance directive with hospital and primary MD. (4) encouraged patient to exercise as much as she can. (5) encouraged patient to make follow up appointment with primary MD.   Active with [facility] for nursing.  [facility] CM Care Plan Problem One     Most Recent Value  Care Plan Problem One  Recent discharge home from rehab  Role Documenting the Problem One  Care Management Coordinator  Care Plan for Problem One  Active  [facility] Long Term Goal   Patient will report no readmissions to the hospital for the next 60 days.   [facility] Long Term Goal Start Date  12/23/16  Interventions for Problem One Long Term Goal  Home visit completed. Encouraged patient to follow up with primary MD.  [facility] CM Short Term Goal #1   Patient will report decreased swelling in legs for the next 30 days.   [facility] CM Short Term Goal #1 Start Date  12/23/16  Interventions for Short Term Goal #1  Reviewed plan of care with patient to elevate legs, decrease salt, get fitted for compresion machine and diabetic shoes.Marland Kitchen  [facility] CM Short Term Goal #2   Patient will report following up with MD as planned in 2 weeks.   [facility] CM Short Term Goal #2 Start Date  12/23/16   Interventions for Short Term Goal #2  encouraged patient to reschedule appointment. confirmed cardiology follow up planned.     Will route this note to MD.  Rowe Pavy, RN, BSN, CEN [facility] Coordinator [phone]

## 2017-01-02 DIAGNOSIS — I5042 Chronic combined systolic (congestive) and diastolic (congestive) heart failure: Secondary | ICD-10-CM | POA: Diagnosis not present

## 2017-01-03 ENCOUNTER — Ambulatory Visit: Payer: Medicare Other | Admitting: Sports Medicine

## 2017-01-04 DIAGNOSIS — N183 Chronic kidney disease, stage 3 (moderate): Secondary | ICD-10-CM | POA: Diagnosis not present

## 2017-01-04 DIAGNOSIS — E1142 Type 2 diabetes mellitus with diabetic polyneuropathy: Secondary | ICD-10-CM | POA: Diagnosis not present

## 2017-01-04 DIAGNOSIS — E1151 Type 2 diabetes mellitus with diabetic peripheral angiopathy without gangrene: Secondary | ICD-10-CM | POA: Diagnosis not present

## 2017-01-04 DIAGNOSIS — I5042 Chronic combined systolic (congestive) and diastolic (congestive) heart failure: Secondary | ICD-10-CM | POA: Diagnosis not present

## 2017-01-04 DIAGNOSIS — Z7982 Long term (current) use of aspirin: Secondary | ICD-10-CM | POA: Diagnosis not present

## 2017-01-04 DIAGNOSIS — D631 Anemia in chronic kidney disease: Secondary | ICD-10-CM | POA: Diagnosis not present

## 2017-01-04 DIAGNOSIS — I130 Hypertensive heart and chronic kidney disease with heart failure and stage 1 through stage 4 chronic kidney disease, or unspecified chronic kidney disease: Secondary | ICD-10-CM | POA: Diagnosis not present

## 2017-01-04 DIAGNOSIS — I2510 Atherosclerotic heart disease of native coronary artery without angina pectoris: Secondary | ICD-10-CM | POA: Diagnosis not present

## 2017-01-04 DIAGNOSIS — I350 Nonrheumatic aortic (valve) stenosis: Secondary | ICD-10-CM | POA: Diagnosis not present

## 2017-01-04 DIAGNOSIS — E11610 Type 2 diabetes mellitus with diabetic neuropathic arthropathy: Secondary | ICD-10-CM | POA: Diagnosis not present

## 2017-01-04 DIAGNOSIS — Z7951 Long term (current) use of inhaled steroids: Secondary | ICD-10-CM | POA: Diagnosis not present

## 2017-01-04 DIAGNOSIS — E1122 Type 2 diabetes mellitus with diabetic chronic kidney disease: Secondary | ICD-10-CM | POA: Diagnosis not present

## 2017-01-04 DIAGNOSIS — Z794 Long term (current) use of insulin: Secondary | ICD-10-CM | POA: Diagnosis not present

## 2017-01-04 DIAGNOSIS — I252 Old myocardial infarction: Secondary | ICD-10-CM | POA: Diagnosis not present

## 2017-01-04 DIAGNOSIS — Z8631 Personal history of diabetic foot ulcer: Secondary | ICD-10-CM | POA: Diagnosis not present

## 2017-01-07 ENCOUNTER — Telehealth: Payer: Self-pay | Admitting: Cardiology

## 2017-01-07 DIAGNOSIS — E1142 Type 2 diabetes mellitus with diabetic polyneuropathy: Secondary | ICD-10-CM | POA: Diagnosis not present

## 2017-01-07 DIAGNOSIS — D631 Anemia in chronic kidney disease: Secondary | ICD-10-CM | POA: Diagnosis not present

## 2017-01-07 DIAGNOSIS — I2510 Atherosclerotic heart disease of native coronary artery without angina pectoris: Secondary | ICD-10-CM | POA: Diagnosis not present

## 2017-01-07 DIAGNOSIS — Z7982 Long term (current) use of aspirin: Secondary | ICD-10-CM | POA: Diagnosis not present

## 2017-01-07 DIAGNOSIS — I130 Hypertensive heart and chronic kidney disease with heart failure and stage 1 through stage 4 chronic kidney disease, or unspecified chronic kidney disease: Secondary | ICD-10-CM | POA: Diagnosis not present

## 2017-01-07 DIAGNOSIS — E1122 Type 2 diabetes mellitus with diabetic chronic kidney disease: Secondary | ICD-10-CM | POA: Diagnosis not present

## 2017-01-07 DIAGNOSIS — I252 Old myocardial infarction: Secondary | ICD-10-CM | POA: Diagnosis not present

## 2017-01-07 DIAGNOSIS — Z794 Long term (current) use of insulin: Secondary | ICD-10-CM | POA: Diagnosis not present

## 2017-01-07 DIAGNOSIS — N183 Chronic kidney disease, stage 3 (moderate): Secondary | ICD-10-CM | POA: Diagnosis not present

## 2017-01-07 DIAGNOSIS — Z7951 Long term (current) use of inhaled steroids: Secondary | ICD-10-CM | POA: Diagnosis not present

## 2017-01-07 DIAGNOSIS — I350 Nonrheumatic aortic (valve) stenosis: Secondary | ICD-10-CM | POA: Diagnosis not present

## 2017-01-07 DIAGNOSIS — E11610 Type 2 diabetes mellitus with diabetic neuropathic arthropathy: Secondary | ICD-10-CM | POA: Diagnosis not present

## 2017-01-07 DIAGNOSIS — Z8631 Personal history of diabetic foot ulcer: Secondary | ICD-10-CM | POA: Diagnosis not present

## 2017-01-07 DIAGNOSIS — E1151 Type 2 diabetes mellitus with diabetic peripheral angiopathy without gangrene: Secondary | ICD-10-CM | POA: Diagnosis not present

## 2017-01-07 DIAGNOSIS — I5042 Chronic combined systolic (congestive) and diastolic (congestive) heart failure: Secondary | ICD-10-CM | POA: Diagnosis not present

## 2017-01-07 NOTE — Telephone Encounter (Signed)
Received records from [facility] for appointment on 02/17/17 with Dr Antoine Poche.  Records put with Dr Hochrein's schedule for 02/17/17. lp

## 2017-01-08 ENCOUNTER — Other Ambulatory Visit: Payer: Self-pay

## 2017-01-08 NOTE — Patient Outreach (Signed)
Transition of care: Placed call to patient who was on her way to MD office to get fitted for compression machine to decrease swelling in legs. Patient reports that she is dizzy today.  Reviewed with patient CBG which was 210 this am.  Reports BP is fine.  Reports that she is staying hydrated.   Patient reports that she still does not have a follow up planned with primary MD. Encouraged patient to make an appointment.  Reviewed MD's request to have CBG readings on a weekly basis called to MD office. Patient reports that she provided this information to MD office yesterday.   PLAN: Will continue weekly transition of care calls.  Encouraged patient to call MD office and make an appointment.  Rowe Pavy, RN, BSN, CEN [facility] [employer] [phone]

## 2017-01-09 ENCOUNTER — Ambulatory Visit: Payer: Medicare Other | Admitting: Cardiology

## 2017-01-09 DIAGNOSIS — N189 Chronic kidney disease, unspecified: Secondary | ICD-10-CM | POA: Diagnosis not present

## 2017-01-09 DIAGNOSIS — D631 Anemia in chronic kidney disease: Secondary | ICD-10-CM | POA: Diagnosis not present

## 2017-01-10 DIAGNOSIS — Z7982 Long term (current) use of aspirin: Secondary | ICD-10-CM | POA: Diagnosis not present

## 2017-01-10 DIAGNOSIS — I2510 Atherosclerotic heart disease of native coronary artery without angina pectoris: Secondary | ICD-10-CM | POA: Diagnosis not present

## 2017-01-10 DIAGNOSIS — D631 Anemia in chronic kidney disease: Secondary | ICD-10-CM | POA: Diagnosis not present

## 2017-01-10 DIAGNOSIS — Z8631 Personal history of diabetic foot ulcer: Secondary | ICD-10-CM | POA: Diagnosis not present

## 2017-01-10 DIAGNOSIS — Z7951 Long term (current) use of inhaled steroids: Secondary | ICD-10-CM | POA: Diagnosis not present

## 2017-01-10 DIAGNOSIS — I5042 Chronic combined systolic (congestive) and diastolic (congestive) heart failure: Secondary | ICD-10-CM | POA: Diagnosis not present

## 2017-01-10 DIAGNOSIS — E1151 Type 2 diabetes mellitus with diabetic peripheral angiopathy without gangrene: Secondary | ICD-10-CM | POA: Diagnosis not present

## 2017-01-10 DIAGNOSIS — I252 Old myocardial infarction: Secondary | ICD-10-CM | POA: Diagnosis not present

## 2017-01-10 DIAGNOSIS — I130 Hypertensive heart and chronic kidney disease with heart failure and stage 1 through stage 4 chronic kidney disease, or unspecified chronic kidney disease: Secondary | ICD-10-CM | POA: Diagnosis not present

## 2017-01-10 DIAGNOSIS — I350 Nonrheumatic aortic (valve) stenosis: Secondary | ICD-10-CM | POA: Diagnosis not present

## 2017-01-10 DIAGNOSIS — Z794 Long term (current) use of insulin: Secondary | ICD-10-CM | POA: Diagnosis not present

## 2017-01-10 DIAGNOSIS — E1122 Type 2 diabetes mellitus with diabetic chronic kidney disease: Secondary | ICD-10-CM | POA: Diagnosis not present

## 2017-01-10 DIAGNOSIS — N183 Chronic kidney disease, stage 3 (moderate): Secondary | ICD-10-CM | POA: Diagnosis not present

## 2017-01-10 DIAGNOSIS — E1142 Type 2 diabetes mellitus with diabetic polyneuropathy: Secondary | ICD-10-CM | POA: Diagnosis not present

## 2017-01-10 DIAGNOSIS — E11610 Type 2 diabetes mellitus with diabetic neuropathic arthropathy: Secondary | ICD-10-CM | POA: Diagnosis not present

## 2017-01-14 ENCOUNTER — Telehealth: Payer: Self-pay | Admitting: Podiatry

## 2017-01-14 DIAGNOSIS — Z8631 Personal history of diabetic foot ulcer: Secondary | ICD-10-CM | POA: Diagnosis not present

## 2017-01-14 DIAGNOSIS — E1151 Type 2 diabetes mellitus with diabetic peripheral angiopathy without gangrene: Secondary | ICD-10-CM | POA: Diagnosis not present

## 2017-01-14 DIAGNOSIS — I2510 Atherosclerotic heart disease of native coronary artery without angina pectoris: Secondary | ICD-10-CM | POA: Diagnosis not present

## 2017-01-14 DIAGNOSIS — E1142 Type 2 diabetes mellitus with diabetic polyneuropathy: Secondary | ICD-10-CM | POA: Diagnosis not present

## 2017-01-14 DIAGNOSIS — I5042 Chronic combined systolic (congestive) and diastolic (congestive) heart failure: Secondary | ICD-10-CM | POA: Diagnosis not present

## 2017-01-14 DIAGNOSIS — E1122 Type 2 diabetes mellitus with diabetic chronic kidney disease: Secondary | ICD-10-CM | POA: Diagnosis not present

## 2017-01-14 DIAGNOSIS — I350 Nonrheumatic aortic (valve) stenosis: Secondary | ICD-10-CM | POA: Diagnosis not present

## 2017-01-14 DIAGNOSIS — Z794 Long term (current) use of insulin: Secondary | ICD-10-CM | POA: Diagnosis not present

## 2017-01-14 DIAGNOSIS — I252 Old myocardial infarction: Secondary | ICD-10-CM | POA: Diagnosis not present

## 2017-01-14 DIAGNOSIS — I130 Hypertensive heart and chronic kidney disease with heart failure and stage 1 through stage 4 chronic kidney disease, or unspecified chronic kidney disease: Secondary | ICD-10-CM | POA: Diagnosis not present

## 2017-01-14 DIAGNOSIS — E11610 Type 2 diabetes mellitus with diabetic neuropathic arthropathy: Secondary | ICD-10-CM | POA: Diagnosis not present

## 2017-01-14 DIAGNOSIS — Z7982 Long term (current) use of aspirin: Secondary | ICD-10-CM | POA: Diagnosis not present

## 2017-01-14 DIAGNOSIS — D631 Anemia in chronic kidney disease: Secondary | ICD-10-CM | POA: Diagnosis not present

## 2017-01-14 DIAGNOSIS — Z7951 Long term (current) use of inhaled steroids: Secondary | ICD-10-CM | POA: Diagnosis not present

## 2017-01-14 DIAGNOSIS — N183 Chronic kidney disease, stage 3 (moderate): Secondary | ICD-10-CM | POA: Diagnosis not present

## 2017-01-14 NOTE — Telephone Encounter (Signed)
This patient called to the office on 01/13/2017.  She states that she has been a patient of Dr. [name]. She says Dr. Marylene Land has been treating a diabetic ulcer on her foot for over a year and a half.  Dr. Marylene Land ordered an unknown test on her lower legs.  When she went to have the tests performed. She was told by the clinic that she needed stronger antibiotics to help improve her foot and leg cellulitis. She previously had been taking cephalexin to help control the infection.  Therefore, I prescribed a prescription of doxycycline 100 mg 1 twice a day and called into the [facility] on [address] in [place].  I did tell the patient that the clinic will now be open 5 days a week. If she needs to be seen at the [place] office.   Helane Gunther DPM

## 2017-01-15 DIAGNOSIS — I350 Nonrheumatic aortic (valve) stenosis: Secondary | ICD-10-CM | POA: Diagnosis not present

## 2017-01-15 DIAGNOSIS — D631 Anemia in chronic kidney disease: Secondary | ICD-10-CM | POA: Diagnosis not present

## 2017-01-15 DIAGNOSIS — I2510 Atherosclerotic heart disease of native coronary artery without angina pectoris: Secondary | ICD-10-CM | POA: Diagnosis not present

## 2017-01-15 DIAGNOSIS — E1142 Type 2 diabetes mellitus with diabetic polyneuropathy: Secondary | ICD-10-CM | POA: Diagnosis not present

## 2017-01-15 DIAGNOSIS — Z7982 Long term (current) use of aspirin: Secondary | ICD-10-CM | POA: Diagnosis not present

## 2017-01-15 DIAGNOSIS — I252 Old myocardial infarction: Secondary | ICD-10-CM | POA: Diagnosis not present

## 2017-01-15 DIAGNOSIS — N183 Chronic kidney disease, stage 3 (moderate): Secondary | ICD-10-CM | POA: Diagnosis not present

## 2017-01-15 DIAGNOSIS — I5042 Chronic combined systolic (congestive) and diastolic (congestive) heart failure: Secondary | ICD-10-CM | POA: Diagnosis not present

## 2017-01-15 DIAGNOSIS — Z7951 Long term (current) use of inhaled steroids: Secondary | ICD-10-CM | POA: Diagnosis not present

## 2017-01-15 DIAGNOSIS — E1122 Type 2 diabetes mellitus with diabetic chronic kidney disease: Secondary | ICD-10-CM | POA: Diagnosis not present

## 2017-01-15 DIAGNOSIS — Z794 Long term (current) use of insulin: Secondary | ICD-10-CM | POA: Diagnosis not present

## 2017-01-15 DIAGNOSIS — Z8631 Personal history of diabetic foot ulcer: Secondary | ICD-10-CM | POA: Diagnosis not present

## 2017-01-15 DIAGNOSIS — I130 Hypertensive heart and chronic kidney disease with heart failure and stage 1 through stage 4 chronic kidney disease, or unspecified chronic kidney disease: Secondary | ICD-10-CM | POA: Diagnosis not present

## 2017-01-15 DIAGNOSIS — E1151 Type 2 diabetes mellitus with diabetic peripheral angiopathy without gangrene: Secondary | ICD-10-CM | POA: Diagnosis not present

## 2017-01-15 DIAGNOSIS — E11610 Type 2 diabetes mellitus with diabetic neuropathic arthropathy: Secondary | ICD-10-CM | POA: Diagnosis not present

## 2017-01-16 ENCOUNTER — Other Ambulatory Visit: Payer: Self-pay

## 2017-01-16 ENCOUNTER — Ambulatory Visit: Payer: Self-pay

## 2017-01-16 NOTE — Patient Outreach (Signed)
Transition of care: Placed call to patient and spoke with husband as patient was asleep.  Husband reports patient is about the same. Reports legs continue to be swollen. Reports patient was put on a new antibiotic ( doxycycline).  Husband states CBG doing well between 100-200. Unable to give me exact numbers.   Reports that patient has a follow up planned with primary MD on 01/20/2017 ( after lunch he said).  PLAN: will continue weekly transition of care calls. Rowe Pavy, RN, BSN, CEN [facility] [employer] [phone]

## 2017-01-17 ENCOUNTER — Telehealth: Payer: Self-pay | Admitting: Sports Medicine

## 2017-01-17 DIAGNOSIS — I5042 Chronic combined systolic (congestive) and diastolic (congestive) heart failure: Secondary | ICD-10-CM | POA: Diagnosis not present

## 2017-01-17 DIAGNOSIS — E11610 Type 2 diabetes mellitus with diabetic neuropathic arthropathy: Secondary | ICD-10-CM | POA: Diagnosis not present

## 2017-01-17 DIAGNOSIS — Z794 Long term (current) use of insulin: Secondary | ICD-10-CM | POA: Diagnosis not present

## 2017-01-17 DIAGNOSIS — I350 Nonrheumatic aortic (valve) stenosis: Secondary | ICD-10-CM | POA: Diagnosis not present

## 2017-01-17 DIAGNOSIS — E1142 Type 2 diabetes mellitus with diabetic polyneuropathy: Secondary | ICD-10-CM | POA: Diagnosis not present

## 2017-01-17 DIAGNOSIS — E1122 Type 2 diabetes mellitus with diabetic chronic kidney disease: Secondary | ICD-10-CM | POA: Diagnosis not present

## 2017-01-17 DIAGNOSIS — D631 Anemia in chronic kidney disease: Secondary | ICD-10-CM | POA: Diagnosis not present

## 2017-01-17 DIAGNOSIS — Z8631 Personal history of diabetic foot ulcer: Secondary | ICD-10-CM | POA: Diagnosis not present

## 2017-01-17 DIAGNOSIS — Z7951 Long term (current) use of inhaled steroids: Secondary | ICD-10-CM | POA: Diagnosis not present

## 2017-01-17 DIAGNOSIS — I130 Hypertensive heart and chronic kidney disease with heart failure and stage 1 through stage 4 chronic kidney disease, or unspecified chronic kidney disease: Secondary | ICD-10-CM | POA: Diagnosis not present

## 2017-01-17 DIAGNOSIS — I2510 Atherosclerotic heart disease of native coronary artery without angina pectoris: Secondary | ICD-10-CM | POA: Diagnosis not present

## 2017-01-17 DIAGNOSIS — N183 Chronic kidney disease, stage 3 (moderate): Secondary | ICD-10-CM | POA: Diagnosis not present

## 2017-01-17 DIAGNOSIS — I252 Old myocardial infarction: Secondary | ICD-10-CM | POA: Diagnosis not present

## 2017-01-17 DIAGNOSIS — E1151 Type 2 diabetes mellitus with diabetic peripheral angiopathy without gangrene: Secondary | ICD-10-CM | POA: Diagnosis not present

## 2017-01-17 DIAGNOSIS — Z7982 Long term (current) use of aspirin: Secondary | ICD-10-CM | POA: Diagnosis not present

## 2017-01-17 NOTE — Telephone Encounter (Signed)
Called patient and informed her that she will need Cardiology clearance before I can Rx Normatech edema pumps. Patient has appt with Dr. Wat[place] Lions 02-17-17 and states that she will call to see if she can get this appointment moved up because she is having a difficult time with the swelling and pain in her legs. I encouraged patient to call back if she has any other concerns -Dr. Marylene Land

## 2017-01-20 DIAGNOSIS — D539 Nutritional anemia, unspecified: Secondary | ICD-10-CM | POA: Diagnosis not present

## 2017-01-20 DIAGNOSIS — I509 Heart failure, unspecified: Secondary | ICD-10-CM | POA: Diagnosis not present

## 2017-01-20 DIAGNOSIS — R600 Localized edema: Secondary | ICD-10-CM | POA: Diagnosis not present

## 2017-01-20 DIAGNOSIS — Z23 Encounter for immunization: Secondary | ICD-10-CM | POA: Diagnosis not present

## 2017-01-20 DIAGNOSIS — Z0000 Encounter for general adult medical examination without abnormal findings: Secondary | ICD-10-CM | POA: Diagnosis not present

## 2017-01-20 DIAGNOSIS — D649 Anemia, unspecified: Secondary | ICD-10-CM | POA: Diagnosis not present

## 2017-01-20 DIAGNOSIS — E1165 Type 2 diabetes mellitus with hyperglycemia: Secondary | ICD-10-CM | POA: Diagnosis not present

## 2017-01-21 DIAGNOSIS — I5042 Chronic combined systolic (congestive) and diastolic (congestive) heart failure: Secondary | ICD-10-CM | POA: Diagnosis not present

## 2017-01-21 DIAGNOSIS — E1151 Type 2 diabetes mellitus with diabetic peripheral angiopathy without gangrene: Secondary | ICD-10-CM | POA: Diagnosis not present

## 2017-01-21 DIAGNOSIS — Z794 Long term (current) use of insulin: Secondary | ICD-10-CM | POA: Diagnosis not present

## 2017-01-21 DIAGNOSIS — I350 Nonrheumatic aortic (valve) stenosis: Secondary | ICD-10-CM | POA: Diagnosis not present

## 2017-01-21 DIAGNOSIS — Z8631 Personal history of diabetic foot ulcer: Secondary | ICD-10-CM | POA: Diagnosis not present

## 2017-01-21 DIAGNOSIS — E1122 Type 2 diabetes mellitus with diabetic chronic kidney disease: Secondary | ICD-10-CM | POA: Diagnosis not present

## 2017-01-21 DIAGNOSIS — Z7982 Long term (current) use of aspirin: Secondary | ICD-10-CM | POA: Diagnosis not present

## 2017-01-21 DIAGNOSIS — I2510 Atherosclerotic heart disease of native coronary artery without angina pectoris: Secondary | ICD-10-CM | POA: Diagnosis not present

## 2017-01-21 DIAGNOSIS — N183 Chronic kidney disease, stage 3 (moderate): Secondary | ICD-10-CM | POA: Diagnosis not present

## 2017-01-21 DIAGNOSIS — I252 Old myocardial infarction: Secondary | ICD-10-CM | POA: Diagnosis not present

## 2017-01-21 DIAGNOSIS — E11610 Type 2 diabetes mellitus with diabetic neuropathic arthropathy: Secondary | ICD-10-CM | POA: Diagnosis not present

## 2017-01-21 DIAGNOSIS — Z7951 Long term (current) use of inhaled steroids: Secondary | ICD-10-CM | POA: Diagnosis not present

## 2017-01-21 DIAGNOSIS — E1142 Type 2 diabetes mellitus with diabetic polyneuropathy: Secondary | ICD-10-CM | POA: Diagnosis not present

## 2017-01-21 DIAGNOSIS — D631 Anemia in chronic kidney disease: Secondary | ICD-10-CM | POA: Diagnosis not present

## 2017-01-21 DIAGNOSIS — I130 Hypertensive heart and chronic kidney disease with heart failure and stage 1 through stage 4 chronic kidney disease, or unspecified chronic kidney disease: Secondary | ICD-10-CM | POA: Diagnosis not present

## 2017-01-22 ENCOUNTER — Telehealth: Payer: Self-pay | Admitting: Cardiology

## 2017-01-22 ENCOUNTER — Telehealth: Payer: Self-pay | Admitting: *Deleted

## 2017-01-22 NOTE — Telephone Encounter (Signed)
Received records from [facility] for appointment on 01/23/17 with Dr Antoine Poche.  Records put with Dr Hochrein's schedule for 01/23/17. lp

## 2017-01-22 NOTE — Telephone Encounter (Signed)
Called received from Dr. Jeanie Sewer to state that the patient had been seen by him and she had developed worsening lower extremity edema. Per Dr. Antoine Poche, the patient should be placed on the schedule for tomorrow 9/13. She has an appointment at 1:20 on 9/13. The patient has been called and verbalized her understanding.

## 2017-01-22 NOTE — Progress Notes (Signed)
HPI The patient presents for follow up of aortic stenosis.   She has a cardiomyopathy with an EF 40-45%. She was admitted as a transfer from [facility] 10/06/16 with a NSTEMI. Her course was complicated by CHF requiring several days diuresis (her cath was delayed secondary to persistent CHF), anemia requiring transfusion, and acute on CRI. Her enzymes are low and flat. She ultimately underwent cath 10/11/16 which showed moderate, diffuse3v disease. The plan wasfor medical therapy.   She then presented to [facility] 11/04/16 with chest pain and positive troponin. It was decided to re study her and on 11/07/16 she underwent intervention to two sites in her RCA by Dr Tresa Endo. She had chest pain post PCI but her Troponin was flat and no further intervention was not planned. She was discharged 11/09/16.  She has had problems with increased edema and Dr. Jeanie Sewer called yesterday to report that she was having increased edema.    She says that her weight has actually been down although she can't weigh herself at home. Per our previous discussions she's been trying to keep her legs elevated. She doesn't drink excessive fluids and she watches her salt very carefully. She is seeing podiatry and they want to do some boots to try to reduce her swelling.  She says that she is urinating every two hours and does not want to take increased diuretic.  The patient denies any new symptoms such as chest discomfort, neck or arm discomfort. There has been no new shortness of breath, PND or orthopnea. There have been no reported palpitations, presyncope or syncope.  She complaints about continued pain in her legs.     Allergies  Allergen Reactions  . Levofloxacin Nausea And Vomiting and Other (See Comments)     Confusion  . Amlodipine Other (See Comments)    Localized edema  . Gabapentin Other (See Comments)    Disoriented, no strength in legs  . Hydrochlorothiazide Other (See Comments)    Hot and disoriented  . Ibuprofen Other (See  Comments)    Affected kidneys - stopped flow of urine Reaction to Advil  . Metformin And Related Other (See Comments)    Affected kidneys - stopped flow of urine  . Simvastatin Other (See Comments)    Chest pain    Current Outpatient Prescriptions  Medication Sig Dispense Refill  . Ascorbic Acid (VITAMIN C) 1000 MG tablet Take 1,000 mg by mouth daily.    Marland Kitchen aspirin EC 81 MG EC tablet Take 1 tablet (81 mg total) by mouth daily. 30 tablet 0  . atorvastatin (LIPITOR) 40 MG tablet Take 1 tablet (40 mg total) by mouth daily at 6 PM. 30 tablet 3  . carvedilol (COREG) 6.25 MG tablet Take 6.25 mg by mouth 2 (two) times daily with a meal.    . cephALEXin (KEFLEX) 500 MG capsule Take 1 capsule (500 mg total) by mouth 3 (three) times daily. 30 capsule 0  . clopidogrel (PLAVIX) 75 MG tablet Take 1 tablet (75 mg total) by mouth daily with breakfast. 30 tablet 6  . cyclobenzaprine (FLEXERIL) 5 MG tablet Take 5 mg by mouth 3 (three) times daily.  0  . fluticasone (FLONASE) 50 MCG/ACT nasal spray Place 1 spray into both nostrils daily as needed (seasonal allergies).     . furosemide (LASIX) 40 MG tablet Take 40 mg by mouth daily.     Marland Kitchen glimepiride (AMARYL) 2 MG tablet Take 2 mg by mouth daily with breakfast.    . insulin  glargine (LANTUS) 100 UNIT/ML injection Inject 20 Units into the skin at bedtime.     . isosorbide mononitrate (IMDUR) 30 MG 24 hr tablet Take 1 tablet (30 mg total) by mouth daily. 30 tablet 6  . linagliptin (TRADJENTA) 5 MG TABS tablet Take 5 mg by mouth daily.    Marland Kitchen lisinopril (PRINIVIL,ZESTRIL) 30 MG tablet Take 30 mg by mouth daily.    . metolazone (ZAROXOLYN) 2.5 MG tablet Take Mon, Wed, and Fri 30 mins prior to Lasix or as directed. 15 tablet 3  . nitroGLYCERIN (NITROSTAT) 0.4 MG SL tablet Place 1 tablet (0.4 mg total) under the tongue every 5 (five) minutes x 3 doses as needed for chest pain. 25 tablet 3  . oxyCODONE-acetaminophen (PERCOCET/ROXICET) 5-325 MG tablet Take 1 tablet by  mouth every 4 (four) hours as needed.    . pantoprazole (PROTONIX) 40 MG tablet Take 1 tablet (40 mg total) by mouth daily. 30 tablet 6  . Polyethyl Glycol-Propyl Glycol (SYSTANE OP) Place 1 drop into both eyes daily as needed (dry eyes).    . polyethylene glycol (MIRALAX / GLYCOLAX) packet Take 17 g by mouth daily as needed (constipation). Mix in 8 oz liquid and drink    . traMADol (ULTRAM) 50 MG tablet Take 1 tablet (50 mg total) by mouth every 6 (six) hours as needed for severe pain. 20 tablet 0   No current facility-administered medications for this visit.     Past Medical History:  Diagnosis Date  . Aortic stenosis, mild   . CAD (coronary artery disease)    a. 09/2016 NSTEMI/Cath: RCA 70p/m, RPDA 95ost-->Med Rx;  b. 10/2016 PCI: RCA 70p/m (2.25 x 15 Resolute Onyx DES), RPDA 95ost (2.0 x 12 Resolute Onyx DES).  . Charcot foot due to diabetes mellitus (HCC)   . Chronic combined systolic (congestive) and diastolic (congestive) heart failure (HCC)    a. 07/2016 Echo: EF 40-45%, Gr2 DD, mild AS, triv AI, mild MR, mod dil LA.  . Diabetes mellitus   . HTN (hypertension)   . Hyperlipidemia   . Myocardial infarction (HCC)   . Neuropathy   . Pancreatitis   . Physical deconditioning    a. W/C bound.    Past Surgical History:  Procedure Laterality Date  . ABDOMINAL HYSTERECTOMY    . APPENDECTOMY    . CORONARY STENT INTERVENTION N/A 11/05/2016   Procedure: Coronary Stent Intervention;  Surgeon: Lennette Bihari, MD;  Location: [facility] INVASIVE CV LAB;  Service: Cardiovascular;  Laterality: N/A;  . FOOT SURGERY    . LAPAROSCOPIC INCISIONAL / UMBILICAL / VENTRAL HERNIA REPAIR  01/04/2008   Dr Bertram Savin  . LAPAROSCOPIC LYSIS OF ADHESIONS  2007   Dr Donovan Kail  . LEFT HEART CATH AND CORONARY ANGIOGRAPHY N/A 10/11/2016   Procedure: Left Heart Cath and Coronary Angiography;  Surgeon: Yvonne Kendall, MD;  Location: [facility] INVASIVE CV LAB;  Service: Cardiovascular;  Laterality: N/A;  . OVARIAN CYST  REMOVAL    . ROBOT ASSISTED PYELOPLASTY  02/2007   Dr Laverle Patter  . SUTURE REMOVAL  08/17/2009   Dr Bertram Savin .  Right paramedian GoreTex stitch  . TONSILLECTOMY      ROS:   As stated in the HPI and negative for all other systems.   PHYSICAL EXAM BP (!) 143/69   Pulse 86   Ht  (1.676 m)   Wt 144 lb (65.3 kg)   SpO2 99%   BMI 23.24 kg/m   PHYSICAL EXAM GEN:  No distress but frail NECK:  No jugular venous distention at 90 degrees, waveform within normal limits, carotid upstroke brisk and symmetric, no bruits, no thyromegaly LYMPHATICS:  No cervical adenopathy LUNGS:  Clear to auscultation bilaterally BACK:  No CVA tenderness CHEST:  Unremarkable HEART:  S1 and S2 within normal limits, no S3, no S4, no clicks, no rubs, no murmurs ABD:  Positive bowel sounds normal in frequency in pitch, no bruits, no rebound, no guarding, unable to assess midline mass or bruit with the patient seated. EXT:  2 plus pulses throughout, severe right and moderate left leg edema, no cyanosis no clubbing SKIN:  No rashes no nodules NEURO:  Cranial nerves II through XII grossly intact, motor grossly intact throughout PSYCH:  Cognitively intact, oriented to person place and time    ASSESSMENT AND PLAN   CAD:   She is no longer having chest pain.  No further work up is planned.    ACUTE ON CHRONIC SYSTOLIC AND DIASTOLIC HF:     She does not have left sided failure symptoms.  Her problem is with swelling in her legs.  I think that this is primarily a vein problem.  She will not take increased diuretic as she says she already urinates too frequently.  I think that it is fine for her to use the compression boots that she is describing.  Of note I did give her a limited dose of low dose Ultram for pain management in her legs.  This is until she can discuss further refills with Noni Saupe, MD.  Of note this has been a chronic problem and there has not been a recent change.  She has had no DVT on  previous study.  I think that the pretest probability of new DVT as the etiology.  No Doppler testing will be ordered.   CKD:   Her creatinine earlier this week was 1.05.  This will be followed by Noni Saupe, MD  I reviewed office labs.    HTN (hypertension) -  The blood pressure is at target. No change in medications is indicated. We will continue with therapeutic lifestyle changes (TLC).   AORTIC STENOSIS -  This was mild on the most recent echo.  No further testing is indicated.

## 2017-01-23 ENCOUNTER — Telehealth: Payer: Self-pay | Admitting: Sports Medicine

## 2017-01-23 ENCOUNTER — Telehealth: Payer: Self-pay | Admitting: *Deleted

## 2017-01-23 ENCOUNTER — Ambulatory Visit (INDEPENDENT_AMBULATORY_CARE_PROVIDER_SITE_OTHER): Payer: Medicare Other | Admitting: Cardiology

## 2017-01-23 ENCOUNTER — Encounter: Payer: Self-pay | Admitting: Cardiology

## 2017-01-23 ENCOUNTER — Other Ambulatory Visit: Payer: Self-pay

## 2017-01-23 VITALS — BP 143/69 | HR 86 | Ht 66.0 in | Wt 144.0 lb

## 2017-01-23 DIAGNOSIS — N183 Chronic kidney disease, stage 3 unspecified: Secondary | ICD-10-CM

## 2017-01-23 DIAGNOSIS — I2510 Atherosclerotic heart disease of native coronary artery without angina pectoris: Secondary | ICD-10-CM | POA: Diagnosis not present

## 2017-01-23 DIAGNOSIS — R609 Edema, unspecified: Secondary | ICD-10-CM

## 2017-01-23 DIAGNOSIS — I130 Hypertensive heart and chronic kidney disease with heart failure and stage 1 through stage 4 chronic kidney disease, or unspecified chronic kidney disease: Secondary | ICD-10-CM | POA: Diagnosis not present

## 2017-01-23 DIAGNOSIS — Z7951 Long term (current) use of inhaled steroids: Secondary | ICD-10-CM | POA: Diagnosis not present

## 2017-01-23 DIAGNOSIS — E1151 Type 2 diabetes mellitus with diabetic peripheral angiopathy without gangrene: Secondary | ICD-10-CM | POA: Diagnosis not present

## 2017-01-23 DIAGNOSIS — E1122 Type 2 diabetes mellitus with diabetic chronic kidney disease: Secondary | ICD-10-CM | POA: Diagnosis not present

## 2017-01-23 DIAGNOSIS — Z8631 Personal history of diabetic foot ulcer: Secondary | ICD-10-CM | POA: Diagnosis not present

## 2017-01-23 DIAGNOSIS — I10 Essential (primary) hypertension: Secondary | ICD-10-CM

## 2017-01-23 DIAGNOSIS — D631 Anemia in chronic kidney disease: Secondary | ICD-10-CM | POA: Diagnosis not present

## 2017-01-23 DIAGNOSIS — E11610 Type 2 diabetes mellitus with diabetic neuropathic arthropathy: Secondary | ICD-10-CM | POA: Diagnosis not present

## 2017-01-23 DIAGNOSIS — Z794 Long term (current) use of insulin: Secondary | ICD-10-CM | POA: Diagnosis not present

## 2017-01-23 DIAGNOSIS — E1142 Type 2 diabetes mellitus with diabetic polyneuropathy: Secondary | ICD-10-CM | POA: Diagnosis not present

## 2017-01-23 DIAGNOSIS — I252 Old myocardial infarction: Secondary | ICD-10-CM | POA: Diagnosis not present

## 2017-01-23 DIAGNOSIS — Z7982 Long term (current) use of aspirin: Secondary | ICD-10-CM | POA: Diagnosis not present

## 2017-01-23 DIAGNOSIS — I5042 Chronic combined systolic (congestive) and diastolic (congestive) heart failure: Secondary | ICD-10-CM | POA: Diagnosis not present

## 2017-01-23 DIAGNOSIS — I350 Nonrheumatic aortic (valve) stenosis: Secondary | ICD-10-CM | POA: Diagnosis not present

## 2017-01-23 MED ORDER — TRAMADOL HCL 50 MG PO TABS: 50.0000 mg | ORAL_TABLET | Freq: Four times a day (QID) | ORAL | 0 refills | Status: DC | PRN

## 2017-01-23 NOTE — Telephone Encounter (Signed)
Patient states that her heart Doctor approved the edema pumps. Val when you talk with the patient about pain management, please let her know that we will have to get the approval/medical clearance in writing and then once we do then please order [employer] edema pumps Thanks Dr. Marylene Land

## 2017-01-23 NOTE — Patient Instructions (Signed)

## 2017-01-23 NOTE — Telephone Encounter (Signed)
Called and spoke with patients husband as she was still asleep relayed Dr [name] message that she can not keep refilling the tramadol.  That she can take extra strength Tylenol and needs to go see pain management.  Patients husband asked so is she going to prescribe something.  I told him no Dr Marylene Land said she can no longer do that he stated whatever and said goodbye

## 2017-01-23 NOTE — Patient Outreach (Signed)
Transition of care:  Placed call to patient who reports that she is having increased pain in legs and feet. Reports that she saw her primary MD this week and is going to see cardiology today. Reports that she is out of pain medications.  Denies increased swelling. Just reports increased pain.  Reports CBG today of 167.     PLAN: will contact patient again in 30 days to assess progress. Patient has completed transition of care without a readmission.  Emily Pavy, RN, BSN, CEN [facility] [phone]

## 2017-01-23 NOTE — Telephone Encounter (Signed)
Patient says that she has had pain all night and wants a script of Tramadol HCL. I realize that Emily Miranda spoke to her husband, but she called after that conversation.

## 2017-01-23 NOTE — Telephone Encounter (Signed)
-----   Message from Asencion Islam, [place] sent at 01/16/2017  6:35 PM EDT ----- Contact: [phone] We will Rx the Lymphedema pumps after she sees the cardiologist for clearance. Per chart review she should have an appointment on 02-17-17 with Dr Antoine Poche.  -Dr Marylene Land ----- Message ----- From: Renaldo Reel A Sent: 01/16/2017   6:23 PM To: Asencion Islam, DPM   Called and spoke with patient she said she had already been given all the results.  She said her legs were swollen tight and are very uncomfortable.  I informed her of using the antibiotic cream and ace wraps she said she would try that.  Patient questioned if you were still ordering her the pumps?  I told her I would call her back once I had heard from you.   ----- Message ----- From: Asencion Islam, DPM Sent: 01/15/2017   3:29 PM To: Melody A Christoper Fabian M Bedington  Melody Can you let patient know I reviewed the results of her test and there are no clots but  She does have venous insufficiency where the veins in her legs don't work like they should therefore keeping the extra fluid in her legs. Have her cleanse her legs and apply antibiotic cream to the areas that are weeping and cover with ACE wraps. Once the weeping stops then she can return to compression stockings to only be worn during the day. May take them off at night for bed or when showering Thanks Dr. Kathie Rhodes  ----- Message ----- From: Warner Mccreedy Sent: 01/15/2017   3:20 PM To: Melody A Ricke Hey, DPM  Inquiring about vascular tests; legs are so swollen that they are weeping now.  How long should she be wearing the compression hose?

## 2017-01-23 NOTE — Telephone Encounter (Signed)
My Heart doctor approved the brace.

## 2017-01-23 NOTE — Telephone Encounter (Signed)
Emily Miranda will follow up with patient for referral to pain management -Dr. Marylene Land

## 2017-01-23 NOTE — Telephone Encounter (Signed)
-----   Message from Asencion Islam, [place] sent at 01/22/2017  3:35 PM EDT ----- Contact: [phone] [place] Patient was to see pain management. I am unable to keep refilling her Tramadol. She can take Tylenol extra strength over the counter to help meanwhile -Dr. Marylene Land  ----- Message ----- From: Warner Mccreedy Sent: 01/22/2017   3:11 PM To: Derald Lorge A Ricke Hey, DPM  Patient needs her Tramadol HCL refilled.  She has no refills left.

## 2017-01-24 ENCOUNTER — Telehealth: Payer: Self-pay | Admitting: *Deleted

## 2017-01-24 NOTE — Telephone Encounter (Deleted)
-----   Message from Asencion Islam, [place] sent at 01/23/2017  5:37 PM EDT ----- Contact: [phone] Hi  Can you follow up with patient in regards to seeing pain management?. We put in a request a while back for the patient. Patient keeps calling for Tramadol when I have advised extra strength Tylenol as needed and that she needs to see pain management. We will not give her any more Tramadol. Thanks Dr. Marylene Land ----- Message ----- From: Warner Mccreedy Sent: 01/23/2017  10:15 AM To: Melody A Ricke Hey, DPM  Patient says that she has had pain all night and wants a script of Tramadol HCL.  I realize that Melody spoke to her husband, but she called after that conversation.

## 2017-01-24 NOTE — Telephone Encounter (Addendum)
I informed pt Dr. Marylene Land needed a written medical clearance for the edema pumps and I would call [facility].01/27/2017-I spoke with receptionist at [facility] and she stated pt was called twice, 1st her husband was to give their phone number for her to call for an appt, 2nd they were told pt was in rehab, at this point pt would need to contact them and they have 2 clinics flooded and she would need to call 01/30/2017 to get on the schedule, if after that date she would need a new referral. I informed pt of the call history with [facility] and the recommendation to call 01/30/2017, I offered to refer her to another doctor, Dr. Vance Peper and pt asked what the referral would be for and I told her pain management. Pt states she didn't think she needed it, she was using tramadol. I told her Dr. Marylene Land was not going to prescribe the tramadol and if she continued to need the tramadol then she needed pain management. Pt asked for the [facility] number and I gave her [phone] and told her to call 01/30/2017.

## 2017-01-24 NOTE — Telephone Encounter (Addendum)
-----   Message from Asencion Islam, [place] sent at 01/23/2017  5:37 PM EDT ----- Contact: [phone] Hi Kelijah Towry Can you follow up with patient in regards to seeing pain management?. We put in a request a while back for the patient. Patient keeps calling for Tramadol when I have advised extra strength Tylenol as needed and that she needs to see pain management. We will not give her any more Tramadol. Thanks Dr. Marylene Land ----- Message ----- From: Warner Mccreedy Sent: 01/23/2017  10:15 AM To: Melody A Ricke Hey, DPM  Patient says that she has had pain all night and wants a script of Tramadol HCL.  I realize that Melody spoke to her husband, but she called after that conversation.  01/24/2017-[facility] not currently open. [facility] is closed today.01/30/2017-Received copy of Dr. Antoine Poche - [facility] 01/23/2017 clinical note stating pt had medical clearance for compression boots on fax cover page from Salley Slaughter, RN. Faxed required form, clinicals from [facility], and Dr. Marylene Land with demographics to [employer]. 01/31/2017-Received [employer] fax request for additional information concerning pt's PA for Lymphedema pump. Faxed LOV 12/27/2016 to [employer].02/12/2017-Lindsay - [facility] states they had received the Addendum fax, but still need documentation of 4 weeks edema exercise/PT, or a statement as to why exercise/PT was not indicated and diagnosis of Lymphedema. I informed [facility], our office needed copies of the last 4 weeks of pt's PT.

## 2017-01-26 DIAGNOSIS — M14671 Charcot's joint, right ankle and foot: Secondary | ICD-10-CM | POA: Diagnosis not present

## 2017-01-26 DIAGNOSIS — M79671 Pain in right foot: Secondary | ICD-10-CM | POA: Diagnosis not present

## 2017-01-26 DIAGNOSIS — L97412 Non-pressure chronic ulcer of right heel and midfoot with fat layer exposed: Secondary | ICD-10-CM | POA: Diagnosis not present

## 2017-01-26 DIAGNOSIS — E11621 Type 2 diabetes mellitus with foot ulcer: Secondary | ICD-10-CM | POA: Diagnosis not present

## 2017-01-27 ENCOUNTER — Telehealth: Payer: Self-pay | Admitting: Sports Medicine

## 2017-01-27 NOTE — Telephone Encounter (Signed)
I was calling to see if the heart doctor faxed over clearance for me to get those things for my legs to help the swelling.

## 2017-01-27 NOTE — Telephone Encounter (Signed)
No documentation has been received by the [facility] office.

## 2017-01-28 DIAGNOSIS — N189 Chronic kidney disease, unspecified: Secondary | ICD-10-CM | POA: Diagnosis not present

## 2017-01-28 DIAGNOSIS — D631 Anemia in chronic kidney disease: Secondary | ICD-10-CM | POA: Diagnosis not present

## 2017-01-29 DIAGNOSIS — I130 Hypertensive heart and chronic kidney disease with heart failure and stage 1 through stage 4 chronic kidney disease, or unspecified chronic kidney disease: Secondary | ICD-10-CM | POA: Diagnosis not present

## 2017-01-29 DIAGNOSIS — I2510 Atherosclerotic heart disease of native coronary artery without angina pectoris: Secondary | ICD-10-CM | POA: Diagnosis not present

## 2017-01-29 DIAGNOSIS — E1142 Type 2 diabetes mellitus with diabetic polyneuropathy: Secondary | ICD-10-CM | POA: Diagnosis not present

## 2017-01-29 DIAGNOSIS — N183 Chronic kidney disease, stage 3 (moderate): Secondary | ICD-10-CM | POA: Diagnosis not present

## 2017-01-29 DIAGNOSIS — Z794 Long term (current) use of insulin: Secondary | ICD-10-CM | POA: Diagnosis not present

## 2017-01-29 DIAGNOSIS — I350 Nonrheumatic aortic (valve) stenosis: Secondary | ICD-10-CM | POA: Diagnosis not present

## 2017-01-29 DIAGNOSIS — E11610 Type 2 diabetes mellitus with diabetic neuropathic arthropathy: Secondary | ICD-10-CM | POA: Diagnosis not present

## 2017-01-29 DIAGNOSIS — D631 Anemia in chronic kidney disease: Secondary | ICD-10-CM | POA: Diagnosis not present

## 2017-01-29 DIAGNOSIS — E1122 Type 2 diabetes mellitus with diabetic chronic kidney disease: Secondary | ICD-10-CM | POA: Diagnosis not present

## 2017-01-29 DIAGNOSIS — I252 Old myocardial infarction: Secondary | ICD-10-CM | POA: Diagnosis not present

## 2017-01-29 DIAGNOSIS — Z8631 Personal history of diabetic foot ulcer: Secondary | ICD-10-CM | POA: Diagnosis not present

## 2017-01-29 DIAGNOSIS — E1151 Type 2 diabetes mellitus with diabetic peripheral angiopathy without gangrene: Secondary | ICD-10-CM | POA: Diagnosis not present

## 2017-01-29 DIAGNOSIS — Z7982 Long term (current) use of aspirin: Secondary | ICD-10-CM | POA: Diagnosis not present

## 2017-01-29 DIAGNOSIS — I5042 Chronic combined systolic (congestive) and diastolic (congestive) heart failure: Secondary | ICD-10-CM | POA: Diagnosis not present

## 2017-01-29 DIAGNOSIS — Z7951 Long term (current) use of inhaled steroids: Secondary | ICD-10-CM | POA: Diagnosis not present

## 2017-01-29 NOTE — Telephone Encounter (Signed)
I informed pt, as of 01/27/2017 our office had not received medical clearance.

## 2017-01-30 ENCOUNTER — Telehealth: Payer: Self-pay | Admitting: Cardiology

## 2017-01-30 NOTE — Telephone Encounter (Signed)
Last OV on 9/13 - Per note Dr. Antoine Poche indicated OK for patient to wear compression boots - I am not sure what kind she will need or if indicated pressure rating - routed for advice on prescription.

## 2017-01-30 NOTE — Telephone Encounter (Signed)
New message     Pt needs a prescription for compression sleeves sent to [facility] office

## 2017-01-30 NOTE — Telephone Encounter (Signed)
Follow up    Pt needs this done today if possible,  She said she is in a lot and her legs are weeping.

## 2017-01-30 NOTE — Telephone Encounter (Signed)
I called [facility] and spoke w staff member, they needed fax to be sent to RN who handles DME prescriptions. Supporting documentation from Dr. Jenene Slicker notes faxed to provided number.  Pt is aware I have sent this, and voiced thanks

## 2017-02-05 DIAGNOSIS — I350 Nonrheumatic aortic (valve) stenosis: Secondary | ICD-10-CM | POA: Diagnosis not present

## 2017-02-05 DIAGNOSIS — Z8631 Personal history of diabetic foot ulcer: Secondary | ICD-10-CM | POA: Diagnosis not present

## 2017-02-05 DIAGNOSIS — N183 Chronic kidney disease, stage 3 (moderate): Secondary | ICD-10-CM | POA: Diagnosis not present

## 2017-02-05 DIAGNOSIS — I130 Hypertensive heart and chronic kidney disease with heart failure and stage 1 through stage 4 chronic kidney disease, or unspecified chronic kidney disease: Secondary | ICD-10-CM | POA: Diagnosis not present

## 2017-02-05 DIAGNOSIS — I252 Old myocardial infarction: Secondary | ICD-10-CM | POA: Diagnosis not present

## 2017-02-05 DIAGNOSIS — Z7982 Long term (current) use of aspirin: Secondary | ICD-10-CM | POA: Diagnosis not present

## 2017-02-05 DIAGNOSIS — E1122 Type 2 diabetes mellitus with diabetic chronic kidney disease: Secondary | ICD-10-CM | POA: Diagnosis not present

## 2017-02-05 DIAGNOSIS — E1142 Type 2 diabetes mellitus with diabetic polyneuropathy: Secondary | ICD-10-CM | POA: Diagnosis not present

## 2017-02-05 DIAGNOSIS — I5042 Chronic combined systolic (congestive) and diastolic (congestive) heart failure: Secondary | ICD-10-CM | POA: Diagnosis not present

## 2017-02-05 DIAGNOSIS — Z794 Long term (current) use of insulin: Secondary | ICD-10-CM | POA: Diagnosis not present

## 2017-02-05 DIAGNOSIS — Z7951 Long term (current) use of inhaled steroids: Secondary | ICD-10-CM | POA: Diagnosis not present

## 2017-02-05 DIAGNOSIS — D631 Anemia in chronic kidney disease: Secondary | ICD-10-CM | POA: Diagnosis not present

## 2017-02-05 DIAGNOSIS — E1151 Type 2 diabetes mellitus with diabetic peripheral angiopathy without gangrene: Secondary | ICD-10-CM | POA: Diagnosis not present

## 2017-02-05 DIAGNOSIS — E11610 Type 2 diabetes mellitus with diabetic neuropathic arthropathy: Secondary | ICD-10-CM | POA: Diagnosis not present

## 2017-02-05 DIAGNOSIS — I2510 Atherosclerotic heart disease of native coronary artery without angina pectoris: Secondary | ICD-10-CM | POA: Diagnosis not present

## 2017-02-11 ENCOUNTER — Other Ambulatory Visit: Payer: Self-pay | Admitting: Cardiology

## 2017-02-11 DIAGNOSIS — D631 Anemia in chronic kidney disease: Secondary | ICD-10-CM | POA: Diagnosis not present

## 2017-02-11 DIAGNOSIS — N189 Chronic kidney disease, unspecified: Secondary | ICD-10-CM | POA: Diagnosis not present

## 2017-02-12 DIAGNOSIS — N183 Chronic kidney disease, stage 3 (moderate): Secondary | ICD-10-CM | POA: Diagnosis not present

## 2017-02-12 DIAGNOSIS — I2510 Atherosclerotic heart disease of native coronary artery without angina pectoris: Secondary | ICD-10-CM | POA: Diagnosis not present

## 2017-02-12 DIAGNOSIS — D631 Anemia in chronic kidney disease: Secondary | ICD-10-CM | POA: Diagnosis not present

## 2017-02-12 DIAGNOSIS — I5042 Chronic combined systolic (congestive) and diastolic (congestive) heart failure: Secondary | ICD-10-CM | POA: Diagnosis not present

## 2017-02-12 DIAGNOSIS — I350 Nonrheumatic aortic (valve) stenosis: Secondary | ICD-10-CM | POA: Diagnosis not present

## 2017-02-12 DIAGNOSIS — Z8631 Personal history of diabetic foot ulcer: Secondary | ICD-10-CM | POA: Diagnosis not present

## 2017-02-12 DIAGNOSIS — E1142 Type 2 diabetes mellitus with diabetic polyneuropathy: Secondary | ICD-10-CM | POA: Diagnosis not present

## 2017-02-12 DIAGNOSIS — I252 Old myocardial infarction: Secondary | ICD-10-CM | POA: Diagnosis not present

## 2017-02-12 DIAGNOSIS — E1122 Type 2 diabetes mellitus with diabetic chronic kidney disease: Secondary | ICD-10-CM | POA: Diagnosis not present

## 2017-02-12 DIAGNOSIS — E1151 Type 2 diabetes mellitus with diabetic peripheral angiopathy without gangrene: Secondary | ICD-10-CM | POA: Diagnosis not present

## 2017-02-12 DIAGNOSIS — Z794 Long term (current) use of insulin: Secondary | ICD-10-CM | POA: Diagnosis not present

## 2017-02-12 DIAGNOSIS — I130 Hypertensive heart and chronic kidney disease with heart failure and stage 1 through stage 4 chronic kidney disease, or unspecified chronic kidney disease: Secondary | ICD-10-CM | POA: Diagnosis not present

## 2017-02-12 DIAGNOSIS — Z7951 Long term (current) use of inhaled steroids: Secondary | ICD-10-CM | POA: Diagnosis not present

## 2017-02-12 DIAGNOSIS — E11610 Type 2 diabetes mellitus with diabetic neuropathic arthropathy: Secondary | ICD-10-CM | POA: Diagnosis not present

## 2017-02-12 DIAGNOSIS — Z7982 Long term (current) use of aspirin: Secondary | ICD-10-CM | POA: Diagnosis not present

## 2017-02-12 NOTE — Telephone Encounter (Signed)
I attached a secondary addendum to my last note and added lymphedema and the fact that we will send PT notes -Dr. Marylene Land

## 2017-02-12 NOTE — Telephone Encounter (Signed)
Its been greater than 4 weeks since her last visit and she still has the edema. The patient has had home PT with [facility] maybe we can get the documents from them to serve as proof Thanks Dr. Marylene Land

## 2017-02-13 ENCOUNTER — Other Ambulatory Visit: Payer: Self-pay | Admitting: Cardiology

## 2017-02-13 ENCOUNTER — Telehealth: Payer: Self-pay | Admitting: Sports Medicine

## 2017-02-13 NOTE — Telephone Encounter (Signed)
Patient called office wanting refill on Tramadol. Patient can not have refill. Patient is well aware that she should follow up with pain clinic as previously referred. If patient calls again, we will remind him of this. -Dr. Marylene Land

## 2017-02-13 NOTE — Telephone Encounter (Signed)
Faxed Addendum of 05/15/2016 and 4 weeks of in home PT notes to [facility].

## 2017-02-13 NOTE — Telephone Encounter (Signed)
Yes lol

## 2017-02-16 DIAGNOSIS — E1122 Type 2 diabetes mellitus with diabetic chronic kidney disease: Secondary | ICD-10-CM | POA: Diagnosis not present

## 2017-02-16 DIAGNOSIS — N189 Chronic kidney disease, unspecified: Secondary | ICD-10-CM

## 2017-02-16 DIAGNOSIS — Z79899 Other long term (current) drug therapy: Secondary | ICD-10-CM | POA: Diagnosis not present

## 2017-02-16 DIAGNOSIS — I080 Rheumatic disorders of both mitral and aortic valves: Secondary | ICD-10-CM | POA: Diagnosis not present

## 2017-02-16 DIAGNOSIS — I509 Heart failure, unspecified: Secondary | ICD-10-CM | POA: Diagnosis not present

## 2017-02-16 DIAGNOSIS — R262 Difficulty in walking, not elsewhere classified: Secondary | ICD-10-CM | POA: Diagnosis not present

## 2017-02-16 DIAGNOSIS — R1032 Left lower quadrant pain: Secondary | ICD-10-CM | POA: Diagnosis not present

## 2017-02-16 DIAGNOSIS — I110 Hypertensive heart disease with heart failure: Secondary | ICD-10-CM | POA: Diagnosis not present

## 2017-02-16 DIAGNOSIS — E1165 Type 2 diabetes mellitus with hyperglycemia: Secondary | ICD-10-CM | POA: Diagnosis not present

## 2017-02-16 DIAGNOSIS — Z7902 Long term (current) use of antithrombotics/antiplatelets: Secondary | ICD-10-CM | POA: Diagnosis not present

## 2017-02-16 DIAGNOSIS — D649 Anemia, unspecified: Secondary | ICD-10-CM | POA: Diagnosis not present

## 2017-02-16 DIAGNOSIS — I252 Old myocardial infarction: Secondary | ICD-10-CM | POA: Diagnosis not present

## 2017-02-16 DIAGNOSIS — I5021 Acute systolic (congestive) heart failure: Secondary | ICD-10-CM | POA: Diagnosis not present

## 2017-02-16 DIAGNOSIS — R5381 Other malaise: Secondary | ICD-10-CM | POA: Diagnosis not present

## 2017-02-16 DIAGNOSIS — R52 Pain, unspecified: Secondary | ICD-10-CM | POA: Diagnosis not present

## 2017-02-16 DIAGNOSIS — Z66 Do not resuscitate: Secondary | ICD-10-CM | POA: Diagnosis not present

## 2017-02-16 DIAGNOSIS — D631 Anemia in chronic kidney disease: Secondary | ICD-10-CM | POA: Diagnosis not present

## 2017-02-16 DIAGNOSIS — I10 Essential (primary) hypertension: Secondary | ICD-10-CM | POA: Diagnosis not present

## 2017-02-16 DIAGNOSIS — Z7984 Long term (current) use of oral hypoglycemic drugs: Secondary | ICD-10-CM | POA: Diagnosis not present

## 2017-02-16 DIAGNOSIS — N183 Chronic kidney disease, stage 3 (moderate): Secondary | ICD-10-CM

## 2017-02-16 DIAGNOSIS — I5023 Acute on chronic systolic (congestive) heart failure: Secondary | ICD-10-CM | POA: Diagnosis not present

## 2017-02-16 DIAGNOSIS — I5020 Unspecified systolic (congestive) heart failure: Secondary | ICD-10-CM

## 2017-02-16 DIAGNOSIS — R601 Generalized edema: Secondary | ICD-10-CM

## 2017-02-16 DIAGNOSIS — E7800 Pure hypercholesterolemia, unspecified: Secondary | ICD-10-CM | POA: Diagnosis not present

## 2017-02-16 DIAGNOSIS — R0602 Shortness of breath: Secondary | ICD-10-CM | POA: Diagnosis not present

## 2017-02-16 DIAGNOSIS — I350 Nonrheumatic aortic (valve) stenosis: Secondary | ICD-10-CM | POA: Diagnosis not present

## 2017-02-16 DIAGNOSIS — I130 Hypertensive heart and chronic kidney disease with heart failure and stage 1 through stage 4 chronic kidney disease, or unspecified chronic kidney disease: Secondary | ICD-10-CM | POA: Diagnosis not present

## 2017-02-16 DIAGNOSIS — I340 Nonrheumatic mitral (valve) insufficiency: Secondary | ICD-10-CM | POA: Diagnosis not present

## 2017-02-16 DIAGNOSIS — E1121 Type 2 diabetes mellitus with diabetic nephropathy: Secondary | ICD-10-CM | POA: Diagnosis not present

## 2017-02-16 DIAGNOSIS — M79606 Pain in leg, unspecified: Secondary | ICD-10-CM | POA: Diagnosis not present

## 2017-02-16 DIAGNOSIS — I2510 Atherosclerotic heart disease of native coronary artery without angina pectoris: Secondary | ICD-10-CM | POA: Diagnosis not present

## 2017-02-16 DIAGNOSIS — Z7982 Long term (current) use of aspirin: Secondary | ICD-10-CM | POA: Diagnosis not present

## 2017-02-16 DIAGNOSIS — Z955 Presence of coronary angioplasty implant and graft: Secondary | ICD-10-CM | POA: Diagnosis not present

## 2017-02-17 ENCOUNTER — Ambulatory Visit: Payer: Medicare Other | Admitting: Cardiology

## 2017-02-17 DIAGNOSIS — I5021 Acute systolic (congestive) heart failure: Secondary | ICD-10-CM | POA: Diagnosis not present

## 2017-02-17 DIAGNOSIS — I509 Heart failure, unspecified: Secondary | ICD-10-CM | POA: Diagnosis not present

## 2017-02-17 DIAGNOSIS — N189 Chronic kidney disease, unspecified: Secondary | ICD-10-CM | POA: Diagnosis not present

## 2017-02-17 DIAGNOSIS — I340 Nonrheumatic mitral (valve) insufficiency: Secondary | ICD-10-CM

## 2017-02-17 DIAGNOSIS — D649 Anemia, unspecified: Secondary | ICD-10-CM | POA: Diagnosis not present

## 2017-02-17 DIAGNOSIS — I5020 Unspecified systolic (congestive) heart failure: Secondary | ICD-10-CM | POA: Diagnosis not present

## 2017-02-17 DIAGNOSIS — I2510 Atherosclerotic heart disease of native coronary artery without angina pectoris: Secondary | ICD-10-CM

## 2017-02-17 DIAGNOSIS — R601 Generalized edema: Secondary | ICD-10-CM | POA: Diagnosis not present

## 2017-02-17 NOTE — Telephone Encounter (Signed)
Husband notified)DPR) to call PCP

## 2017-02-18 DIAGNOSIS — N189 Chronic kidney disease, unspecified: Secondary | ICD-10-CM | POA: Diagnosis not present

## 2017-02-18 DIAGNOSIS — R601 Generalized edema: Secondary | ICD-10-CM | POA: Diagnosis not present

## 2017-02-18 DIAGNOSIS — I2510 Atherosclerotic heart disease of native coronary artery without angina pectoris: Secondary | ICD-10-CM | POA: Diagnosis not present

## 2017-02-18 DIAGNOSIS — D649 Anemia, unspecified: Secondary | ICD-10-CM | POA: Diagnosis not present

## 2017-02-18 DIAGNOSIS — I5021 Acute systolic (congestive) heart failure: Secondary | ICD-10-CM

## 2017-02-18 DIAGNOSIS — I509 Heart failure, unspecified: Secondary | ICD-10-CM | POA: Diagnosis not present

## 2017-02-19 DIAGNOSIS — I5021 Acute systolic (congestive) heart failure: Secondary | ICD-10-CM

## 2017-02-19 DIAGNOSIS — I2510 Atherosclerotic heart disease of native coronary artery without angina pectoris: Secondary | ICD-10-CM

## 2017-02-19 DIAGNOSIS — I10 Essential (primary) hypertension: Secondary | ICD-10-CM

## 2017-02-19 DIAGNOSIS — R601 Generalized edema: Secondary | ICD-10-CM | POA: Diagnosis not present

## 2017-02-20 DIAGNOSIS — I10 Essential (primary) hypertension: Secondary | ICD-10-CM | POA: Diagnosis not present

## 2017-02-20 DIAGNOSIS — R601 Generalized edema: Secondary | ICD-10-CM | POA: Diagnosis not present

## 2017-02-20 DIAGNOSIS — I2510 Atherosclerotic heart disease of native coronary artery without angina pectoris: Secondary | ICD-10-CM | POA: Diagnosis not present

## 2017-02-20 DIAGNOSIS — I5021 Acute systolic (congestive) heart failure: Secondary | ICD-10-CM | POA: Diagnosis not present

## 2017-02-21 DIAGNOSIS — R601 Generalized edema: Secondary | ICD-10-CM | POA: Diagnosis not present

## 2017-02-21 DIAGNOSIS — I2510 Atherosclerotic heart disease of native coronary artery without angina pectoris: Secondary | ICD-10-CM

## 2017-02-21 DIAGNOSIS — I10 Essential (primary) hypertension: Secondary | ICD-10-CM | POA: Diagnosis not present

## 2017-02-21 DIAGNOSIS — I5021 Acute systolic (congestive) heart failure: Secondary | ICD-10-CM

## 2017-02-22 DIAGNOSIS — Z7951 Long term (current) use of inhaled steroids: Secondary | ICD-10-CM | POA: Diagnosis not present

## 2017-02-22 DIAGNOSIS — I5033 Acute on chronic diastolic (congestive) heart failure: Secondary | ICD-10-CM | POA: Diagnosis not present

## 2017-02-22 DIAGNOSIS — I5022 Chronic systolic (congestive) heart failure: Secondary | ICD-10-CM | POA: Diagnosis not present

## 2017-02-22 DIAGNOSIS — N183 Chronic kidney disease, stage 3 (moderate): Secondary | ICD-10-CM | POA: Diagnosis not present

## 2017-02-22 DIAGNOSIS — Z7982 Long term (current) use of aspirin: Secondary | ICD-10-CM | POA: Diagnosis not present

## 2017-02-22 DIAGNOSIS — E11610 Type 2 diabetes mellitus with diabetic neuropathic arthropathy: Secondary | ICD-10-CM | POA: Diagnosis not present

## 2017-02-22 DIAGNOSIS — D631 Anemia in chronic kidney disease: Secondary | ICD-10-CM | POA: Diagnosis not present

## 2017-02-22 DIAGNOSIS — Z8631 Personal history of diabetic foot ulcer: Secondary | ICD-10-CM | POA: Diagnosis not present

## 2017-02-22 DIAGNOSIS — I350 Nonrheumatic aortic (valve) stenosis: Secondary | ICD-10-CM | POA: Diagnosis not present

## 2017-02-22 DIAGNOSIS — I130 Hypertensive heart and chronic kidney disease with heart failure and stage 1 through stage 4 chronic kidney disease, or unspecified chronic kidney disease: Secondary | ICD-10-CM | POA: Diagnosis not present

## 2017-02-22 DIAGNOSIS — Z794 Long term (current) use of insulin: Secondary | ICD-10-CM | POA: Diagnosis not present

## 2017-02-22 DIAGNOSIS — E1122 Type 2 diabetes mellitus with diabetic chronic kidney disease: Secondary | ICD-10-CM | POA: Diagnosis not present

## 2017-02-22 DIAGNOSIS — E1142 Type 2 diabetes mellitus with diabetic polyneuropathy: Secondary | ICD-10-CM | POA: Diagnosis not present

## 2017-02-22 DIAGNOSIS — E1151 Type 2 diabetes mellitus with diabetic peripheral angiopathy without gangrene: Secondary | ICD-10-CM | POA: Diagnosis not present

## 2017-02-22 DIAGNOSIS — I2510 Atherosclerotic heart disease of native coronary artery without angina pectoris: Secondary | ICD-10-CM | POA: Diagnosis not present

## 2017-02-24 ENCOUNTER — Other Ambulatory Visit: Payer: Self-pay

## 2017-02-24 DIAGNOSIS — E1142 Type 2 diabetes mellitus with diabetic polyneuropathy: Secondary | ICD-10-CM | POA: Diagnosis not present

## 2017-02-24 DIAGNOSIS — I5033 Acute on chronic diastolic (congestive) heart failure: Secondary | ICD-10-CM | POA: Diagnosis not present

## 2017-02-24 DIAGNOSIS — Z8631 Personal history of diabetic foot ulcer: Secondary | ICD-10-CM | POA: Diagnosis not present

## 2017-02-24 DIAGNOSIS — Z794 Long term (current) use of insulin: Secondary | ICD-10-CM | POA: Diagnosis not present

## 2017-02-24 DIAGNOSIS — I350 Nonrheumatic aortic (valve) stenosis: Secondary | ICD-10-CM | POA: Diagnosis not present

## 2017-02-24 DIAGNOSIS — Z7982 Long term (current) use of aspirin: Secondary | ICD-10-CM | POA: Diagnosis not present

## 2017-02-24 DIAGNOSIS — I130 Hypertensive heart and chronic kidney disease with heart failure and stage 1 through stage 4 chronic kidney disease, or unspecified chronic kidney disease: Secondary | ICD-10-CM | POA: Diagnosis not present

## 2017-02-24 DIAGNOSIS — I2510 Atherosclerotic heart disease of native coronary artery without angina pectoris: Secondary | ICD-10-CM | POA: Diagnosis not present

## 2017-02-24 DIAGNOSIS — Z7951 Long term (current) use of inhaled steroids: Secondary | ICD-10-CM | POA: Diagnosis not present

## 2017-02-24 DIAGNOSIS — E11610 Type 2 diabetes mellitus with diabetic neuropathic arthropathy: Secondary | ICD-10-CM | POA: Diagnosis not present

## 2017-02-24 DIAGNOSIS — I5022 Chronic systolic (congestive) heart failure: Secondary | ICD-10-CM | POA: Diagnosis not present

## 2017-02-24 DIAGNOSIS — N183 Chronic kidney disease, stage 3 (moderate): Secondary | ICD-10-CM | POA: Diagnosis not present

## 2017-02-24 DIAGNOSIS — E1151 Type 2 diabetes mellitus with diabetic peripheral angiopathy without gangrene: Secondary | ICD-10-CM | POA: Diagnosis not present

## 2017-02-24 DIAGNOSIS — E1122 Type 2 diabetes mellitus with diabetic chronic kidney disease: Secondary | ICD-10-CM | POA: Diagnosis not present

## 2017-02-24 DIAGNOSIS — D631 Anemia in chronic kidney disease: Secondary | ICD-10-CM | POA: Diagnosis not present

## 2017-02-24 NOTE — Patient Outreach (Signed)
60 day telephone follow up/case closure  Reviewed medical record and last cardiology appointment.   Placed call to patient who answered and identified herself.  Patient reports increased swelling and weeping to lower legs. Reports home health nurse applied compression wraps last week and is supposed to come back today to change the wraps.  Pt reports that she does not think her leg swelling is heart failure.  Patient unable to weigh due to unable to stand on both feet.  Denies shortness of breath. Reports that she is awaiting a compression pump for her legs.  Patient reports that she has follow up planned at primary MD office this week.   Denies any new concern today that she needs help with. Reports that she will call home health nurse today about a visit.   Reviewed with patient that she has completed transition of care program. Patient denies any other needs from this case manager. Reviewed case closure with patient and encouraged patient to call in the future if [facility] could assist her.   Will send this noted and case closure letter to MD.   PLAN: case closure. Patient has met her goals.  Tomasa Rand, RN, BSN, CEN [facility] [employer] [phone]

## 2017-02-25 DIAGNOSIS — L97412 Non-pressure chronic ulcer of right heel and midfoot with fat layer exposed: Secondary | ICD-10-CM | POA: Diagnosis not present

## 2017-02-25 DIAGNOSIS — D631 Anemia in chronic kidney disease: Secondary | ICD-10-CM | POA: Diagnosis not present

## 2017-02-25 DIAGNOSIS — E11621 Type 2 diabetes mellitus with foot ulcer: Secondary | ICD-10-CM | POA: Diagnosis not present

## 2017-02-25 DIAGNOSIS — M14671 Charcot's joint, right ankle and foot: Secondary | ICD-10-CM | POA: Diagnosis not present

## 2017-02-25 DIAGNOSIS — N189 Chronic kidney disease, unspecified: Secondary | ICD-10-CM | POA: Diagnosis not present

## 2017-02-25 DIAGNOSIS — M79671 Pain in right foot: Secondary | ICD-10-CM | POA: Diagnosis not present

## 2017-02-26 DIAGNOSIS — D631 Anemia in chronic kidney disease: Secondary | ICD-10-CM | POA: Diagnosis not present

## 2017-02-26 DIAGNOSIS — N189 Chronic kidney disease, unspecified: Secondary | ICD-10-CM | POA: Diagnosis not present

## 2017-02-27 ENCOUNTER — Telehealth: Payer: Self-pay | Admitting: Sports Medicine

## 2017-02-27 DIAGNOSIS — D631 Anemia in chronic kidney disease: Secondary | ICD-10-CM | POA: Diagnosis not present

## 2017-02-27 DIAGNOSIS — Z7982 Long term (current) use of aspirin: Secondary | ICD-10-CM | POA: Diagnosis not present

## 2017-02-27 DIAGNOSIS — E1122 Type 2 diabetes mellitus with diabetic chronic kidney disease: Secondary | ICD-10-CM | POA: Diagnosis not present

## 2017-02-27 DIAGNOSIS — Z8631 Personal history of diabetic foot ulcer: Secondary | ICD-10-CM | POA: Diagnosis not present

## 2017-02-27 DIAGNOSIS — N183 Chronic kidney disease, stage 3 (moderate): Secondary | ICD-10-CM | POA: Diagnosis not present

## 2017-02-27 DIAGNOSIS — E1151 Type 2 diabetes mellitus with diabetic peripheral angiopathy without gangrene: Secondary | ICD-10-CM | POA: Diagnosis not present

## 2017-02-27 DIAGNOSIS — I5033 Acute on chronic diastolic (congestive) heart failure: Secondary | ICD-10-CM | POA: Diagnosis not present

## 2017-02-27 DIAGNOSIS — I5022 Chronic systolic (congestive) heart failure: Secondary | ICD-10-CM | POA: Diagnosis not present

## 2017-02-27 DIAGNOSIS — I2510 Atherosclerotic heart disease of native coronary artery without angina pectoris: Secondary | ICD-10-CM | POA: Diagnosis not present

## 2017-02-27 DIAGNOSIS — I130 Hypertensive heart and chronic kidney disease with heart failure and stage 1 through stage 4 chronic kidney disease, or unspecified chronic kidney disease: Secondary | ICD-10-CM | POA: Diagnosis not present

## 2017-02-27 DIAGNOSIS — Z794 Long term (current) use of insulin: Secondary | ICD-10-CM | POA: Diagnosis not present

## 2017-02-27 DIAGNOSIS — E11610 Type 2 diabetes mellitus with diabetic neuropathic arthropathy: Secondary | ICD-10-CM | POA: Diagnosis not present

## 2017-02-27 DIAGNOSIS — E1142 Type 2 diabetes mellitus with diabetic polyneuropathy: Secondary | ICD-10-CM | POA: Diagnosis not present

## 2017-02-27 DIAGNOSIS — Z7951 Long term (current) use of inhaled steroids: Secondary | ICD-10-CM | POA: Diagnosis not present

## 2017-02-27 DIAGNOSIS — I350 Nonrheumatic aortic (valve) stenosis: Secondary | ICD-10-CM | POA: Diagnosis not present

## 2017-02-27 NOTE — Telephone Encounter (Signed)
Spoke with home health nurse, Aggie Cosier from [facility]. She reports that Dr. Jeanie Sewer ordered unna boots for the patient but she is having too much pain in her legs and can not tolerate them. She reports that she has blisters on the legs that are clear. I advised Aggie Cosier that blisters of the leg are consistent with Lymphedema. I advised if patient can not tolerate unna boots to do Vaseline guaze with light ACE compression to tolerance. I informed Aggie Cosier that for the pain in her legs we have previously referred patient to pain management and that we have ordered lymphedema pumps that the patient is awaiting to receive. Aggie Cosier expressed understanding and thanked me for taking the time to talk with her. -Dr. Marylene Land

## 2017-02-28 DIAGNOSIS — I509 Heart failure, unspecified: Secondary | ICD-10-CM | POA: Diagnosis not present

## 2017-02-28 DIAGNOSIS — I10 Essential (primary) hypertension: Secondary | ICD-10-CM | POA: Diagnosis not present

## 2017-02-28 DIAGNOSIS — E1165 Type 2 diabetes mellitus with hyperglycemia: Secondary | ICD-10-CM | POA: Diagnosis not present

## 2017-03-01 DIAGNOSIS — R229 Localized swelling, mass and lump, unspecified: Secondary | ICD-10-CM | POA: Diagnosis not present

## 2017-03-01 DIAGNOSIS — R600 Localized edema: Secondary | ICD-10-CM | POA: Diagnosis not present

## 2017-03-01 DIAGNOSIS — R0602 Shortness of breath: Secondary | ICD-10-CM | POA: Diagnosis not present

## 2017-03-03 DIAGNOSIS — Z794 Long term (current) use of insulin: Secondary | ICD-10-CM | POA: Diagnosis not present

## 2017-03-03 DIAGNOSIS — I130 Hypertensive heart and chronic kidney disease with heart failure and stage 1 through stage 4 chronic kidney disease, or unspecified chronic kidney disease: Secondary | ICD-10-CM | POA: Diagnosis not present

## 2017-03-03 DIAGNOSIS — E1142 Type 2 diabetes mellitus with diabetic polyneuropathy: Secondary | ICD-10-CM | POA: Diagnosis not present

## 2017-03-03 DIAGNOSIS — Z7951 Long term (current) use of inhaled steroids: Secondary | ICD-10-CM | POA: Diagnosis not present

## 2017-03-03 DIAGNOSIS — I350 Nonrheumatic aortic (valve) stenosis: Secondary | ICD-10-CM | POA: Diagnosis not present

## 2017-03-03 DIAGNOSIS — Z8631 Personal history of diabetic foot ulcer: Secondary | ICD-10-CM | POA: Diagnosis not present

## 2017-03-03 DIAGNOSIS — Z7982 Long term (current) use of aspirin: Secondary | ICD-10-CM | POA: Diagnosis not present

## 2017-03-03 DIAGNOSIS — N183 Chronic kidney disease, stage 3 (moderate): Secondary | ICD-10-CM | POA: Diagnosis not present

## 2017-03-03 DIAGNOSIS — E1122 Type 2 diabetes mellitus with diabetic chronic kidney disease: Secondary | ICD-10-CM | POA: Diagnosis not present

## 2017-03-03 DIAGNOSIS — I5022 Chronic systolic (congestive) heart failure: Secondary | ICD-10-CM | POA: Diagnosis not present

## 2017-03-03 DIAGNOSIS — I5033 Acute on chronic diastolic (congestive) heart failure: Secondary | ICD-10-CM | POA: Diagnosis not present

## 2017-03-03 DIAGNOSIS — E1151 Type 2 diabetes mellitus with diabetic peripheral angiopathy without gangrene: Secondary | ICD-10-CM | POA: Diagnosis not present

## 2017-03-03 DIAGNOSIS — D631 Anemia in chronic kidney disease: Secondary | ICD-10-CM | POA: Diagnosis not present

## 2017-03-03 DIAGNOSIS — E11610 Type 2 diabetes mellitus with diabetic neuropathic arthropathy: Secondary | ICD-10-CM | POA: Diagnosis not present

## 2017-03-03 DIAGNOSIS — I2510 Atherosclerotic heart disease of native coronary artery without angina pectoris: Secondary | ICD-10-CM | POA: Diagnosis not present

## 2017-03-05 ENCOUNTER — Other Ambulatory Visit: Payer: Self-pay | Admitting: Family Medicine

## 2017-03-06 DIAGNOSIS — N183 Chronic kidney disease, stage 3 (moderate): Secondary | ICD-10-CM | POA: Diagnosis not present

## 2017-03-06 DIAGNOSIS — D631 Anemia in chronic kidney disease: Secondary | ICD-10-CM | POA: Diagnosis not present

## 2017-03-06 DIAGNOSIS — Z794 Long term (current) use of insulin: Secondary | ICD-10-CM | POA: Diagnosis not present

## 2017-03-06 DIAGNOSIS — E1151 Type 2 diabetes mellitus with diabetic peripheral angiopathy without gangrene: Secondary | ICD-10-CM | POA: Diagnosis not present

## 2017-03-06 DIAGNOSIS — E11610 Type 2 diabetes mellitus with diabetic neuropathic arthropathy: Secondary | ICD-10-CM | POA: Diagnosis not present

## 2017-03-06 DIAGNOSIS — Z7982 Long term (current) use of aspirin: Secondary | ICD-10-CM | POA: Diagnosis not present

## 2017-03-06 DIAGNOSIS — E1142 Type 2 diabetes mellitus with diabetic polyneuropathy: Secondary | ICD-10-CM | POA: Diagnosis not present

## 2017-03-06 DIAGNOSIS — E1122 Type 2 diabetes mellitus with diabetic chronic kidney disease: Secondary | ICD-10-CM | POA: Diagnosis not present

## 2017-03-06 DIAGNOSIS — I5022 Chronic systolic (congestive) heart failure: Secondary | ICD-10-CM | POA: Diagnosis not present

## 2017-03-06 DIAGNOSIS — I5033 Acute on chronic diastolic (congestive) heart failure: Secondary | ICD-10-CM | POA: Diagnosis not present

## 2017-03-06 DIAGNOSIS — Z8631 Personal history of diabetic foot ulcer: Secondary | ICD-10-CM | POA: Diagnosis not present

## 2017-03-06 DIAGNOSIS — I130 Hypertensive heart and chronic kidney disease with heart failure and stage 1 through stage 4 chronic kidney disease, or unspecified chronic kidney disease: Secondary | ICD-10-CM | POA: Diagnosis not present

## 2017-03-06 DIAGNOSIS — I350 Nonrheumatic aortic (valve) stenosis: Secondary | ICD-10-CM | POA: Diagnosis not present

## 2017-03-06 DIAGNOSIS — Z7951 Long term (current) use of inhaled steroids: Secondary | ICD-10-CM | POA: Diagnosis not present

## 2017-03-06 DIAGNOSIS — I2510 Atherosclerotic heart disease of native coronary artery without angina pectoris: Secondary | ICD-10-CM | POA: Diagnosis not present

## 2017-03-10 DIAGNOSIS — D631 Anemia in chronic kidney disease: Secondary | ICD-10-CM | POA: Diagnosis not present

## 2017-03-10 DIAGNOSIS — E1151 Type 2 diabetes mellitus with diabetic peripheral angiopathy without gangrene: Secondary | ICD-10-CM | POA: Diagnosis not present

## 2017-03-10 DIAGNOSIS — Z7982 Long term (current) use of aspirin: Secondary | ICD-10-CM | POA: Diagnosis not present

## 2017-03-10 DIAGNOSIS — I350 Nonrheumatic aortic (valve) stenosis: Secondary | ICD-10-CM | POA: Diagnosis not present

## 2017-03-10 DIAGNOSIS — E1122 Type 2 diabetes mellitus with diabetic chronic kidney disease: Secondary | ICD-10-CM | POA: Diagnosis not present

## 2017-03-10 DIAGNOSIS — I130 Hypertensive heart and chronic kidney disease with heart failure and stage 1 through stage 4 chronic kidney disease, or unspecified chronic kidney disease: Secondary | ICD-10-CM | POA: Diagnosis not present

## 2017-03-10 DIAGNOSIS — Z8631 Personal history of diabetic foot ulcer: Secondary | ICD-10-CM | POA: Diagnosis not present

## 2017-03-10 DIAGNOSIS — E1142 Type 2 diabetes mellitus with diabetic polyneuropathy: Secondary | ICD-10-CM | POA: Diagnosis not present

## 2017-03-10 DIAGNOSIS — I5033 Acute on chronic diastolic (congestive) heart failure: Secondary | ICD-10-CM | POA: Diagnosis not present

## 2017-03-10 DIAGNOSIS — Z794 Long term (current) use of insulin: Secondary | ICD-10-CM | POA: Diagnosis not present

## 2017-03-10 DIAGNOSIS — N183 Chronic kidney disease, stage 3 (moderate): Secondary | ICD-10-CM | POA: Diagnosis not present

## 2017-03-10 DIAGNOSIS — I5022 Chronic systolic (congestive) heart failure: Secondary | ICD-10-CM | POA: Diagnosis not present

## 2017-03-10 DIAGNOSIS — I2510 Atherosclerotic heart disease of native coronary artery without angina pectoris: Secondary | ICD-10-CM | POA: Diagnosis not present

## 2017-03-10 DIAGNOSIS — E11610 Type 2 diabetes mellitus with diabetic neuropathic arthropathy: Secondary | ICD-10-CM | POA: Diagnosis not present

## 2017-03-10 DIAGNOSIS — Z7951 Long term (current) use of inhaled steroids: Secondary | ICD-10-CM | POA: Diagnosis not present

## 2017-03-13 ENCOUNTER — Telehealth: Payer: Self-pay | Admitting: Cardiology

## 2017-03-13 DIAGNOSIS — I5022 Chronic systolic (congestive) heart failure: Secondary | ICD-10-CM | POA: Diagnosis not present

## 2017-03-13 DIAGNOSIS — I5033 Acute on chronic diastolic (congestive) heart failure: Secondary | ICD-10-CM | POA: Diagnosis not present

## 2017-03-13 DIAGNOSIS — E11610 Type 2 diabetes mellitus with diabetic neuropathic arthropathy: Secondary | ICD-10-CM | POA: Diagnosis not present

## 2017-03-13 DIAGNOSIS — E1122 Type 2 diabetes mellitus with diabetic chronic kidney disease: Secondary | ICD-10-CM | POA: Diagnosis not present

## 2017-03-13 DIAGNOSIS — Z7951 Long term (current) use of inhaled steroids: Secondary | ICD-10-CM | POA: Diagnosis not present

## 2017-03-13 DIAGNOSIS — I2510 Atherosclerotic heart disease of native coronary artery without angina pectoris: Secondary | ICD-10-CM | POA: Diagnosis not present

## 2017-03-13 DIAGNOSIS — E1151 Type 2 diabetes mellitus with diabetic peripheral angiopathy without gangrene: Secondary | ICD-10-CM | POA: Diagnosis not present

## 2017-03-13 DIAGNOSIS — Z794 Long term (current) use of insulin: Secondary | ICD-10-CM | POA: Diagnosis not present

## 2017-03-13 DIAGNOSIS — D631 Anemia in chronic kidney disease: Secondary | ICD-10-CM | POA: Diagnosis not present

## 2017-03-13 DIAGNOSIS — I350 Nonrheumatic aortic (valve) stenosis: Secondary | ICD-10-CM | POA: Diagnosis not present

## 2017-03-13 DIAGNOSIS — Z7982 Long term (current) use of aspirin: Secondary | ICD-10-CM | POA: Diagnosis not present

## 2017-03-13 DIAGNOSIS — Z8631 Personal history of diabetic foot ulcer: Secondary | ICD-10-CM | POA: Diagnosis not present

## 2017-03-13 DIAGNOSIS — N183 Chronic kidney disease, stage 3 (moderate): Secondary | ICD-10-CM | POA: Diagnosis not present

## 2017-03-13 DIAGNOSIS — E1142 Type 2 diabetes mellitus with diabetic polyneuropathy: Secondary | ICD-10-CM | POA: Diagnosis not present

## 2017-03-13 DIAGNOSIS — I130 Hypertensive heart and chronic kidney disease with heart failure and stage 1 through stage 4 chronic kidney disease, or unspecified chronic kidney disease: Secondary | ICD-10-CM | POA: Diagnosis not present

## 2017-03-13 NOTE — Telephone Encounter (Signed)
Patient of Dr. Antoine Poche but [facility] orders are addressed to Dr. Rennis Golden  Mailed signed orders r/t SN assessments r/t CV status, daily weights, diet/fluid restrictions

## 2017-03-17 DIAGNOSIS — I2510 Atherosclerotic heart disease of native coronary artery without angina pectoris: Secondary | ICD-10-CM | POA: Diagnosis not present

## 2017-03-17 DIAGNOSIS — Z7951 Long term (current) use of inhaled steroids: Secondary | ICD-10-CM | POA: Diagnosis not present

## 2017-03-17 DIAGNOSIS — I5022 Chronic systolic (congestive) heart failure: Secondary | ICD-10-CM | POA: Diagnosis not present

## 2017-03-17 DIAGNOSIS — E1122 Type 2 diabetes mellitus with diabetic chronic kidney disease: Secondary | ICD-10-CM | POA: Diagnosis not present

## 2017-03-17 DIAGNOSIS — Z794 Long term (current) use of insulin: Secondary | ICD-10-CM | POA: Diagnosis not present

## 2017-03-17 DIAGNOSIS — I130 Hypertensive heart and chronic kidney disease with heart failure and stage 1 through stage 4 chronic kidney disease, or unspecified chronic kidney disease: Secondary | ICD-10-CM | POA: Diagnosis not present

## 2017-03-17 DIAGNOSIS — I5033 Acute on chronic diastolic (congestive) heart failure: Secondary | ICD-10-CM | POA: Diagnosis not present

## 2017-03-17 DIAGNOSIS — Z8631 Personal history of diabetic foot ulcer: Secondary | ICD-10-CM | POA: Diagnosis not present

## 2017-03-17 DIAGNOSIS — E1151 Type 2 diabetes mellitus with diabetic peripheral angiopathy without gangrene: Secondary | ICD-10-CM | POA: Diagnosis not present

## 2017-03-17 DIAGNOSIS — N183 Chronic kidney disease, stage 3 (moderate): Secondary | ICD-10-CM | POA: Diagnosis not present

## 2017-03-17 DIAGNOSIS — E11610 Type 2 diabetes mellitus with diabetic neuropathic arthropathy: Secondary | ICD-10-CM | POA: Diagnosis not present

## 2017-03-17 DIAGNOSIS — I350 Nonrheumatic aortic (valve) stenosis: Secondary | ICD-10-CM | POA: Diagnosis not present

## 2017-03-17 DIAGNOSIS — D631 Anemia in chronic kidney disease: Secondary | ICD-10-CM | POA: Diagnosis not present

## 2017-03-17 DIAGNOSIS — Z7982 Long term (current) use of aspirin: Secondary | ICD-10-CM | POA: Diagnosis not present

## 2017-03-17 DIAGNOSIS — E1142 Type 2 diabetes mellitus with diabetic polyneuropathy: Secondary | ICD-10-CM | POA: Diagnosis not present

## 2017-03-20 ENCOUNTER — Telehealth: Payer: Self-pay | Admitting: *Deleted

## 2017-03-20 DIAGNOSIS — Z7982 Long term (current) use of aspirin: Secondary | ICD-10-CM | POA: Diagnosis not present

## 2017-03-20 DIAGNOSIS — E1151 Type 2 diabetes mellitus with diabetic peripheral angiopathy without gangrene: Secondary | ICD-10-CM | POA: Diagnosis not present

## 2017-03-20 DIAGNOSIS — I350 Nonrheumatic aortic (valve) stenosis: Secondary | ICD-10-CM | POA: Diagnosis not present

## 2017-03-20 DIAGNOSIS — E1122 Type 2 diabetes mellitus with diabetic chronic kidney disease: Secondary | ICD-10-CM | POA: Diagnosis not present

## 2017-03-20 DIAGNOSIS — E1142 Type 2 diabetes mellitus with diabetic polyneuropathy: Secondary | ICD-10-CM | POA: Diagnosis not present

## 2017-03-20 DIAGNOSIS — Z8631 Personal history of diabetic foot ulcer: Secondary | ICD-10-CM | POA: Diagnosis not present

## 2017-03-20 DIAGNOSIS — D631 Anemia in chronic kidney disease: Secondary | ICD-10-CM | POA: Diagnosis not present

## 2017-03-20 DIAGNOSIS — Z794 Long term (current) use of insulin: Secondary | ICD-10-CM | POA: Diagnosis not present

## 2017-03-20 DIAGNOSIS — Z7951 Long term (current) use of inhaled steroids: Secondary | ICD-10-CM | POA: Diagnosis not present

## 2017-03-20 DIAGNOSIS — I5033 Acute on chronic diastolic (congestive) heart failure: Secondary | ICD-10-CM | POA: Diagnosis not present

## 2017-03-20 DIAGNOSIS — N183 Chronic kidney disease, stage 3 (moderate): Secondary | ICD-10-CM | POA: Diagnosis not present

## 2017-03-20 DIAGNOSIS — I130 Hypertensive heart and chronic kidney disease with heart failure and stage 1 through stage 4 chronic kidney disease, or unspecified chronic kidney disease: Secondary | ICD-10-CM | POA: Diagnosis not present

## 2017-03-20 DIAGNOSIS — I5022 Chronic systolic (congestive) heart failure: Secondary | ICD-10-CM | POA: Diagnosis not present

## 2017-03-20 DIAGNOSIS — I2510 Atherosclerotic heart disease of native coronary artery without angina pectoris: Secondary | ICD-10-CM | POA: Diagnosis not present

## 2017-03-20 DIAGNOSIS — E11610 Type 2 diabetes mellitus with diabetic neuropathic arthropathy: Secondary | ICD-10-CM | POA: Diagnosis not present

## 2017-03-20 NOTE — Telephone Encounter (Signed)
Thanks

## 2017-03-20 NOTE — Telephone Encounter (Signed)
-----   Message from Asencion Islam, [place] sent at 03/19/2017  5:58 PM EST ----- Regarding: Clinical Information Request  Hi Valery Has the patient already started with the pumps? If not then I need to get her in to see [place] for measurements on of the legs because they are requesting more information that I do not have the answers to from previous visits. -Dr. Marylene Land

## 2017-03-20 NOTE — Telephone Encounter (Signed)
I spoke with Emily Miranda and she states she has not started the Lymphedema pump, that after she got all the paper work completed, [employer] called the pump company, and the pump company has changed to another company and [facility] will not authorize with current paper work. I told Emily Miranda, our office had paper work that needed completion and may be from the current pump company and we would need Emily Miranda to make an appt to get the information that is required. I transferred Emily Miranda to scheduler.

## 2017-03-24 DIAGNOSIS — I5033 Acute on chronic diastolic (congestive) heart failure: Secondary | ICD-10-CM | POA: Diagnosis not present

## 2017-03-24 DIAGNOSIS — Z8631 Personal history of diabetic foot ulcer: Secondary | ICD-10-CM | POA: Diagnosis not present

## 2017-03-24 DIAGNOSIS — N183 Chronic kidney disease, stage 3 (moderate): Secondary | ICD-10-CM | POA: Diagnosis not present

## 2017-03-24 DIAGNOSIS — Z7951 Long term (current) use of inhaled steroids: Secondary | ICD-10-CM | POA: Diagnosis not present

## 2017-03-24 DIAGNOSIS — E1122 Type 2 diabetes mellitus with diabetic chronic kidney disease: Secondary | ICD-10-CM | POA: Diagnosis not present

## 2017-03-24 DIAGNOSIS — I2510 Atherosclerotic heart disease of native coronary artery without angina pectoris: Secondary | ICD-10-CM | POA: Diagnosis not present

## 2017-03-24 DIAGNOSIS — I5022 Chronic systolic (congestive) heart failure: Secondary | ICD-10-CM | POA: Diagnosis not present

## 2017-03-24 DIAGNOSIS — E1142 Type 2 diabetes mellitus with diabetic polyneuropathy: Secondary | ICD-10-CM | POA: Diagnosis not present

## 2017-03-24 DIAGNOSIS — E11610 Type 2 diabetes mellitus with diabetic neuropathic arthropathy: Secondary | ICD-10-CM | POA: Diagnosis not present

## 2017-03-24 DIAGNOSIS — E1151 Type 2 diabetes mellitus with diabetic peripheral angiopathy without gangrene: Secondary | ICD-10-CM | POA: Diagnosis not present

## 2017-03-24 DIAGNOSIS — I350 Nonrheumatic aortic (valve) stenosis: Secondary | ICD-10-CM | POA: Diagnosis not present

## 2017-03-24 DIAGNOSIS — D631 Anemia in chronic kidney disease: Secondary | ICD-10-CM | POA: Diagnosis not present

## 2017-03-24 DIAGNOSIS — I130 Hypertensive heart and chronic kidney disease with heart failure and stage 1 through stage 4 chronic kidney disease, or unspecified chronic kidney disease: Secondary | ICD-10-CM | POA: Diagnosis not present

## 2017-03-24 DIAGNOSIS — Z7982 Long term (current) use of aspirin: Secondary | ICD-10-CM | POA: Diagnosis not present

## 2017-03-24 DIAGNOSIS — Z794 Long term (current) use of insulin: Secondary | ICD-10-CM | POA: Diagnosis not present

## 2017-03-26 ENCOUNTER — Ambulatory Visit (INDEPENDENT_AMBULATORY_CARE_PROVIDER_SITE_OTHER): Payer: Medicare Other | Admitting: Sports Medicine

## 2017-03-26 ENCOUNTER — Encounter: Payer: Self-pay | Admitting: Sports Medicine

## 2017-03-26 DIAGNOSIS — S80822A Blister (nonthermal), left lower leg, initial encounter: Secondary | ICD-10-CM

## 2017-03-26 DIAGNOSIS — I739 Peripheral vascular disease, unspecified: Secondary | ICD-10-CM

## 2017-03-26 DIAGNOSIS — M79675 Pain in left toe(s): Secondary | ICD-10-CM

## 2017-03-26 DIAGNOSIS — G8929 Other chronic pain: Secondary | ICD-10-CM

## 2017-03-26 DIAGNOSIS — B351 Tinea unguium: Secondary | ICD-10-CM | POA: Diagnosis not present

## 2017-03-26 DIAGNOSIS — D631 Anemia in chronic kidney disease: Secondary | ICD-10-CM | POA: Diagnosis not present

## 2017-03-26 DIAGNOSIS — E1142 Type 2 diabetes mellitus with diabetic polyneuropathy: Secondary | ICD-10-CM

## 2017-03-26 DIAGNOSIS — N189 Chronic kidney disease, unspecified: Secondary | ICD-10-CM | POA: Diagnosis not present

## 2017-03-26 DIAGNOSIS — I890 Lymphedema, not elsewhere classified: Secondary | ICD-10-CM

## 2017-03-26 DIAGNOSIS — M79674 Pain in right toe(s): Secondary | ICD-10-CM

## 2017-03-26 NOTE — Progress Notes (Addendum)
Subjective: JOSHUA ZERINGUE is a 76 y.o. female patient seen today in office for swelling in both legs. Reports that since her last visit PT has assessed her and gave recommendations for swelling, wound care nurse has been doing profore wraps on the legs and medical doctor has adjusted lasix. States that she also wants her nails trimmed as well. Patient is assisted by son. No other issues noted.   Patient Active Problem List   Diagnosis Date Noted  . Edema 11/24/2016  . Chronic combined systolic and diastolic heart failure (HCC) 11/09/2016  . Unstable angina (HCC) 11/04/2016  . Charcot foot due to diabetes mellitus (HCC) 10/31/2016  . Chronic anemia 10/31/2016  . Mixed hyperlipidemia   . PVD (peripheral vascular disease) (HCC)   . CAD S/P percutaneous coronary angioplasty   . Non-ST elevation (NSTEMI) myocardial infarction (HCC) 10/06/2016  . NSTEMI (non-ST elevated myocardial infarction) (HCC) 10/06/2016  . CKD (chronic kidney disease), stage III (HCC) 08/27/2016  . Chronic diabetic ulcer of right foot  08/27/2016  . Loss of weight   . Acute combined systolic and diastolic congestive heart failure (HCC)   . Shortness of breath 07/29/2016  . Wound infection 11/13/2015  . Abdominal pain, RLQ (right lower quadrant) -chronic since 2009 06/02/2013  . Constipation, chronic 06/02/2013  . SUI (stress urinary incontinence, female) 06/02/2013  . Recurrent UTI (urinary tract infection) 06/02/2013  . Hypertensive cardiomyopathy, with heart failure (HCC)   . CHEST PAIN 11/27/2009  . OVERWEIGHT 11/28/2008  . DM type 2 causing CKD stage 3 (HCC) 11/25/2008  . Mild aortic stenosis 11/25/2008  . PANCREATITIS, HX OF 11/25/2008    Current Outpatient Medications on File Prior to Visit  Medication Sig Dispense Refill  . Ascorbic Acid (VITAMIN C) 1000 MG tablet Take 1,000 mg by mouth daily.    Marland Kitchen aspirin EC 81 MG EC tablet Take 1 tablet (81 mg total) by mouth daily. 30 tablet 0  . atorvastatin  (LIPITOR) 40 MG tablet Take 1 tablet (40 mg total) by mouth daily at 6 PM. 30 tablet 3  . carvedilol (COREG) 6.25 MG tablet Take 6.25 mg by mouth 2 (two) times daily with a meal.    . cephALEXin (KEFLEX) 500 MG capsule Take 1 capsule (500 mg total) by mouth 3 (three) times daily. 30 capsule 0  . clopidogrel (PLAVIX) 75 MG tablet Take 1 tablet (75 mg total) by mouth daily with breakfast. 30 tablet 6  . cyclobenzaprine (FLEXERIL) 5 MG tablet Take 5 mg by mouth 3 (three) times daily.  0  . fluticasone (FLONASE) 50 MCG/ACT nasal spray Place 1 spray into both nostrils daily as needed (seasonal allergies).     . furosemide (LASIX) 40 MG tablet Take 40 mg by mouth daily.     Marland Kitchen glimepiride (AMARYL) 2 MG tablet Take 2 mg by mouth daily with breakfast.    . insulin glargine (LANTUS) 100 UNIT/ML injection Inject 20 Units into the skin at bedtime.     . isosorbide mononitrate (IMDUR) 30 MG 24 hr tablet Take 1 tablet (30 mg total) by mouth daily. 30 tablet 6  . linagliptin (TRADJENTA) 5 MG TABS tablet Take 5 mg by mouth daily.    Marland Kitchen lisinopril (PRINIVIL,ZESTRIL) 30 MG tablet Take 30 mg by mouth daily.    . metolazone (ZAROXOLYN) 2.5 MG tablet Take Mon, Wed, and Fri 30 mins prior to Lasix or as directed. 15 tablet 3  . nitroGLYCERIN (NITROSTAT) 0.4 MG SL tablet Place 1 tablet (0.4 mg  total) under the tongue every 5 (five) minutes x 3 doses as needed for chest pain. 25 tablet 3  . oxyCODONE-acetaminophen (PERCOCET/ROXICET) 5-325 MG tablet Take 1 tablet by mouth every 4 (four) hours as needed.    . pantoprazole (PROTONIX) 40 MG tablet Take 1 tablet (40 mg total) by mouth daily. 30 tablet 6  . Polyethyl Glycol-Propyl Glycol (SYSTANE OP) Place 1 drop into both eyes daily as needed (dry eyes).    . polyethylene glycol (MIRALAX / GLYCOLAX) packet Take 17 g by mouth daily as needed (constipation). Mix in 8 oz liquid and drink    . traMADol (ULTRAM) 50 MG tablet TAKE 1 TABLET BY MOUTH EVERY 6 HOURS AS NEEDED FOR SEVERE  PAIN 20 tablet 0   No current facility-administered medications on file prior to visit.     Allergies  Allergen Reactions  . Levofloxacin Nausea And Vomiting and Other (See Comments)     Confusion  . Amlodipine Other (See Comments)    Localized edema  . Gabapentin Other (See Comments)    Disoriented, no strength in legs  . Hydrochlorothiazide Other (See Comments)    Hot and disoriented  . Ibuprofen Other (See Comments)    Affected kidneys - stopped flow of urine Reaction to Advil  . Metformin And Related Other (See Comments)    Affected kidneys - stopped flow of urine  . Simvastatin Other (See Comments)    Chest pain    Objective: There were no vitals filed for this visit.  General: No acute distress, AAOx3  Focused physical exam No open lesions bilateral, Nails 2-5 bilateral are mildly elongated and thickened consistent with onychomycosis, there is no nail at bilateral hallux from previous removal, there is dependent rubor or right>left leg with  No other  acute signs of infection noted, There is no pain with calf palpation bilateral with 2+ edema bilateral with chronic trophic skin changes right>left with clear weeping drainage from left leg blister, <5cm with no acute infection, Capillary fill time <3 seconds in all digits,  + Charcot foot.   Leg measurements 03-26-17  Right                              Left Length of leg 78cm       79cm Thigh 48cm                    41cm Knee 47.5cm                42.5cm Calf 44cm                    38.5cm Ankle 28cm                  22.5cm   Assessment and Plan:  Problem List Items Addressed This Visit      Cardiovascular and Mediastinum   PVD (peripheral vascular disease) (HCC)    Other Visit Diagnoses    Lymphedema    -  Primary   Pain due to onychomycosis of toenails of both feet       Diabetic polyneuropathy associated with type 2 diabetes mellitus (HCC)       Other chronic pain       Blister of left lower leg, initial  encounter          -Patient seen and evaluated -Mechanically debrided nails x 8 using sterile nail nipper without incident  -  Leg measurements obtained  -Continue with topical pain rub of lidocaine to right as needed for pain as well and reapplied antibiotic cream and compressive dressing with Ace wrap bilateral and to left blister  -Continue with lasix as Rx by PCP and cardiology recs  -Continue with transport wheelchair -Recommend continue with home aid and home PT -Awaiting diabetic shoes  -Return for repeat leg measurements in 1 month or sooner if pain or symptoms worsen.  Medical Neccecssity: Patient has been dealing with swelling and edema in legs >11 weeks (12/27/16 date of treatments) with trophic skin changes consistent with hyperpigmentation and hyperplasia, with history of ulcer/skin break down. Patient has tried PT with supplemental exercises, medication, wraps, surgitube compression sleeves and elevation with no improvement and has not clinically improved. Patient continue with pain in legs, heaviness, instability, burning sensation in legs due to edema. Patient will greatly benefit from pnuematic compression device/edema pumps/profore dressings.   Asencion Islamitorya Yair Dusza, DPM

## 2017-03-27 ENCOUNTER — Ambulatory Visit: Payer: Medicare Other | Admitting: Sports Medicine

## 2017-03-27 ENCOUNTER — Telehealth: Payer: Self-pay | Admitting: *Deleted

## 2017-03-27 DIAGNOSIS — I2510 Atherosclerotic heart disease of native coronary artery without angina pectoris: Secondary | ICD-10-CM | POA: Diagnosis not present

## 2017-03-27 DIAGNOSIS — E1122 Type 2 diabetes mellitus with diabetic chronic kidney disease: Secondary | ICD-10-CM | POA: Diagnosis not present

## 2017-03-27 DIAGNOSIS — E1142 Type 2 diabetes mellitus with diabetic polyneuropathy: Secondary | ICD-10-CM | POA: Diagnosis not present

## 2017-03-27 DIAGNOSIS — Z7951 Long term (current) use of inhaled steroids: Secondary | ICD-10-CM | POA: Diagnosis not present

## 2017-03-27 DIAGNOSIS — N183 Chronic kidney disease, stage 3 (moderate): Secondary | ICD-10-CM | POA: Diagnosis not present

## 2017-03-27 DIAGNOSIS — Z8631 Personal history of diabetic foot ulcer: Secondary | ICD-10-CM | POA: Diagnosis not present

## 2017-03-27 DIAGNOSIS — I130 Hypertensive heart and chronic kidney disease with heart failure and stage 1 through stage 4 chronic kidney disease, or unspecified chronic kidney disease: Secondary | ICD-10-CM | POA: Diagnosis not present

## 2017-03-27 DIAGNOSIS — Z794 Long term (current) use of insulin: Secondary | ICD-10-CM | POA: Diagnosis not present

## 2017-03-27 DIAGNOSIS — I5022 Chronic systolic (congestive) heart failure: Secondary | ICD-10-CM | POA: Diagnosis not present

## 2017-03-27 DIAGNOSIS — E1151 Type 2 diabetes mellitus with diabetic peripheral angiopathy without gangrene: Secondary | ICD-10-CM | POA: Diagnosis not present

## 2017-03-27 DIAGNOSIS — E11610 Type 2 diabetes mellitus with diabetic neuropathic arthropathy: Secondary | ICD-10-CM | POA: Diagnosis not present

## 2017-03-27 DIAGNOSIS — I5033 Acute on chronic diastolic (congestive) heart failure: Secondary | ICD-10-CM | POA: Diagnosis not present

## 2017-03-27 DIAGNOSIS — I350 Nonrheumatic aortic (valve) stenosis: Secondary | ICD-10-CM | POA: Diagnosis not present

## 2017-03-27 DIAGNOSIS — Z7982 Long term (current) use of aspirin: Secondary | ICD-10-CM | POA: Diagnosis not present

## 2017-03-27 DIAGNOSIS — D631 Anemia in chronic kidney disease: Secondary | ICD-10-CM | POA: Diagnosis not present

## 2017-03-27 NOTE — Telephone Encounter (Signed)
I call and spoke with Emily Miranda and she stated she's not having any SOB, do you still like her to follow-up in 2 weeks.

## 2017-03-27 NOTE — Telephone Encounter (Signed)
No need to schedule if she is not having any symptoms.

## 2017-03-27 NOTE — Telephone Encounter (Signed)
-----   Message from Rollene Rotunda, MD sent at 03/24/2017  4:06 PM EST ----- She has increased BNP as drawn by her PCP last month.  I would like to have her come back for follow up to follow up her volume status in the next two weeks or sooner if you call her and she is having symptoms of increased SOB and /or edema.

## 2017-03-28 DIAGNOSIS — L97412 Non-pressure chronic ulcer of right heel and midfoot with fat layer exposed: Secondary | ICD-10-CM | POA: Diagnosis not present

## 2017-03-28 DIAGNOSIS — E11621 Type 2 diabetes mellitus with foot ulcer: Secondary | ICD-10-CM | POA: Diagnosis not present

## 2017-03-28 DIAGNOSIS — M14671 Charcot's joint, right ankle and foot: Secondary | ICD-10-CM | POA: Diagnosis not present

## 2017-03-28 DIAGNOSIS — M79671 Pain in right foot: Secondary | ICD-10-CM | POA: Diagnosis not present

## 2017-03-31 DIAGNOSIS — E1151 Type 2 diabetes mellitus with diabetic peripheral angiopathy without gangrene: Secondary | ICD-10-CM | POA: Diagnosis not present

## 2017-03-31 DIAGNOSIS — I5022 Chronic systolic (congestive) heart failure: Secondary | ICD-10-CM | POA: Diagnosis not present

## 2017-03-31 DIAGNOSIS — I130 Hypertensive heart and chronic kidney disease with heart failure and stage 1 through stage 4 chronic kidney disease, or unspecified chronic kidney disease: Secondary | ICD-10-CM | POA: Diagnosis not present

## 2017-03-31 DIAGNOSIS — E1122 Type 2 diabetes mellitus with diabetic chronic kidney disease: Secondary | ICD-10-CM | POA: Diagnosis not present

## 2017-03-31 DIAGNOSIS — N183 Chronic kidney disease, stage 3 (moderate): Secondary | ICD-10-CM | POA: Diagnosis not present

## 2017-03-31 DIAGNOSIS — E1142 Type 2 diabetes mellitus with diabetic polyneuropathy: Secondary | ICD-10-CM | POA: Diagnosis not present

## 2017-03-31 DIAGNOSIS — I350 Nonrheumatic aortic (valve) stenosis: Secondary | ICD-10-CM | POA: Diagnosis not present

## 2017-03-31 DIAGNOSIS — E11610 Type 2 diabetes mellitus with diabetic neuropathic arthropathy: Secondary | ICD-10-CM | POA: Diagnosis not present

## 2017-03-31 DIAGNOSIS — Z7951 Long term (current) use of inhaled steroids: Secondary | ICD-10-CM | POA: Diagnosis not present

## 2017-03-31 DIAGNOSIS — I2510 Atherosclerotic heart disease of native coronary artery without angina pectoris: Secondary | ICD-10-CM | POA: Diagnosis not present

## 2017-03-31 DIAGNOSIS — D631 Anemia in chronic kidney disease: Secondary | ICD-10-CM | POA: Diagnosis not present

## 2017-03-31 DIAGNOSIS — Z8631 Personal history of diabetic foot ulcer: Secondary | ICD-10-CM | POA: Diagnosis not present

## 2017-03-31 DIAGNOSIS — Z7982 Long term (current) use of aspirin: Secondary | ICD-10-CM | POA: Diagnosis not present

## 2017-03-31 DIAGNOSIS — Z794 Long term (current) use of insulin: Secondary | ICD-10-CM | POA: Diagnosis not present

## 2017-03-31 DIAGNOSIS — I5033 Acute on chronic diastolic (congestive) heart failure: Secondary | ICD-10-CM | POA: Diagnosis not present

## 2017-04-01 ENCOUNTER — Telehealth: Payer: Self-pay | Admitting: Sports Medicine

## 2017-04-01 DIAGNOSIS — I350 Nonrheumatic aortic (valve) stenosis: Secondary | ICD-10-CM | POA: Diagnosis not present

## 2017-04-01 DIAGNOSIS — E11610 Type 2 diabetes mellitus with diabetic neuropathic arthropathy: Secondary | ICD-10-CM | POA: Diagnosis not present

## 2017-04-01 DIAGNOSIS — E1151 Type 2 diabetes mellitus with diabetic peripheral angiopathy without gangrene: Secondary | ICD-10-CM | POA: Diagnosis not present

## 2017-04-01 DIAGNOSIS — Z8631 Personal history of diabetic foot ulcer: Secondary | ICD-10-CM | POA: Diagnosis not present

## 2017-04-01 DIAGNOSIS — I5033 Acute on chronic diastolic (congestive) heart failure: Secondary | ICD-10-CM | POA: Diagnosis not present

## 2017-04-01 DIAGNOSIS — Z794 Long term (current) use of insulin: Secondary | ICD-10-CM | POA: Diagnosis not present

## 2017-04-01 DIAGNOSIS — D631 Anemia in chronic kidney disease: Secondary | ICD-10-CM | POA: Diagnosis not present

## 2017-04-01 DIAGNOSIS — E1142 Type 2 diabetes mellitus with diabetic polyneuropathy: Secondary | ICD-10-CM | POA: Diagnosis not present

## 2017-04-01 DIAGNOSIS — I2510 Atherosclerotic heart disease of native coronary artery without angina pectoris: Secondary | ICD-10-CM | POA: Diagnosis not present

## 2017-04-01 DIAGNOSIS — N183 Chronic kidney disease, stage 3 (moderate): Secondary | ICD-10-CM | POA: Diagnosis not present

## 2017-04-01 DIAGNOSIS — I890 Lymphedema, not elsewhere classified: Secondary | ICD-10-CM

## 2017-04-01 DIAGNOSIS — I5022 Chronic systolic (congestive) heart failure: Secondary | ICD-10-CM | POA: Diagnosis not present

## 2017-04-01 DIAGNOSIS — Z7951 Long term (current) use of inhaled steroids: Secondary | ICD-10-CM | POA: Diagnosis not present

## 2017-04-01 DIAGNOSIS — I130 Hypertensive heart and chronic kidney disease with heart failure and stage 1 through stage 4 chronic kidney disease, or unspecified chronic kidney disease: Secondary | ICD-10-CM | POA: Diagnosis not present

## 2017-04-01 DIAGNOSIS — Z7982 Long term (current) use of aspirin: Secondary | ICD-10-CM | POA: Diagnosis not present

## 2017-04-01 DIAGNOSIS — E1122 Type 2 diabetes mellitus with diabetic chronic kidney disease: Secondary | ICD-10-CM | POA: Diagnosis not present

## 2017-04-01 NOTE — Telephone Encounter (Signed)
Please send referral for unna boots for lymphedema to [facility] Thanks Dr. Marylene Land

## 2017-04-01 NOTE — Telephone Encounter (Signed)
Patient wants a referral to [facility] [phone] as she has met someone that had good results going there.

## 2017-04-01 NOTE — Telephone Encounter (Signed)
Wound care to do unna boots instead of [facility].

## 2017-04-02 NOTE — Telephone Encounter (Signed)
I spoke with [facility] and she said fax our referral form, clinicals and demographics and they would contact pt. Required information faxed.

## 2017-04-07 ENCOUNTER — Telehealth: Payer: Self-pay | Admitting: Sports Medicine

## 2017-04-07 DIAGNOSIS — E1142 Type 2 diabetes mellitus with diabetic polyneuropathy: Secondary | ICD-10-CM | POA: Diagnosis not present

## 2017-04-07 DIAGNOSIS — Z794 Long term (current) use of insulin: Secondary | ICD-10-CM | POA: Diagnosis not present

## 2017-04-07 DIAGNOSIS — I350 Nonrheumatic aortic (valve) stenosis: Secondary | ICD-10-CM | POA: Diagnosis not present

## 2017-04-07 DIAGNOSIS — I130 Hypertensive heart and chronic kidney disease with heart failure and stage 1 through stage 4 chronic kidney disease, or unspecified chronic kidney disease: Secondary | ICD-10-CM | POA: Diagnosis not present

## 2017-04-07 DIAGNOSIS — I2510 Atherosclerotic heart disease of native coronary artery without angina pectoris: Secondary | ICD-10-CM | POA: Diagnosis not present

## 2017-04-07 DIAGNOSIS — Z8631 Personal history of diabetic foot ulcer: Secondary | ICD-10-CM | POA: Diagnosis not present

## 2017-04-07 DIAGNOSIS — D631 Anemia in chronic kidney disease: Secondary | ICD-10-CM | POA: Diagnosis not present

## 2017-04-07 DIAGNOSIS — E1122 Type 2 diabetes mellitus with diabetic chronic kidney disease: Secondary | ICD-10-CM | POA: Diagnosis not present

## 2017-04-07 DIAGNOSIS — E11610 Type 2 diabetes mellitus with diabetic neuropathic arthropathy: Secondary | ICD-10-CM | POA: Diagnosis not present

## 2017-04-07 DIAGNOSIS — Z7951 Long term (current) use of inhaled steroids: Secondary | ICD-10-CM | POA: Diagnosis not present

## 2017-04-07 DIAGNOSIS — Z7982 Long term (current) use of aspirin: Secondary | ICD-10-CM | POA: Diagnosis not present

## 2017-04-07 DIAGNOSIS — N183 Chronic kidney disease, stage 3 (moderate): Secondary | ICD-10-CM | POA: Diagnosis not present

## 2017-04-07 DIAGNOSIS — I890 Lymphedema, not elsewhere classified: Secondary | ICD-10-CM

## 2017-04-07 DIAGNOSIS — I5022 Chronic systolic (congestive) heart failure: Secondary | ICD-10-CM | POA: Diagnosis not present

## 2017-04-07 DIAGNOSIS — I5033 Acute on chronic diastolic (congestive) heart failure: Secondary | ICD-10-CM | POA: Diagnosis not present

## 2017-04-07 DIAGNOSIS — E1151 Type 2 diabetes mellitus with diabetic peripheral angiopathy without gangrene: Secondary | ICD-10-CM | POA: Diagnosis not present

## 2017-04-07 NOTE — Telephone Encounter (Signed)
Patient says that she doesn't want to go to the [facility] here and that she had spoken with someone who had gone to the [facility]ound Care Center in [place] and they had good results, so she would like a referral to them.  If you don't want to give her this, she would like to know the reason why.

## 2017-04-07 NOTE — Telephone Encounter (Signed)
I had [name] put in the referral on last week to wound care center at [facility] -Dr. Kathie Rhodes

## 2017-04-08 NOTE — Telephone Encounter (Signed)
Thanks

## 2017-04-08 NOTE — Telephone Encounter (Signed)
I spoke with Vena Austria - [facility] and she said she would schedule the referral with their podiatric wound specialist. Faxed referral, clinical and demographics to [facility].

## 2017-04-09 DIAGNOSIS — E1122 Type 2 diabetes mellitus with diabetic chronic kidney disease: Secondary | ICD-10-CM | POA: Diagnosis not present

## 2017-04-09 DIAGNOSIS — Z8631 Personal history of diabetic foot ulcer: Secondary | ICD-10-CM | POA: Diagnosis not present

## 2017-04-09 DIAGNOSIS — I5022 Chronic systolic (congestive) heart failure: Secondary | ICD-10-CM | POA: Diagnosis not present

## 2017-04-09 DIAGNOSIS — E11610 Type 2 diabetes mellitus with diabetic neuropathic arthropathy: Secondary | ICD-10-CM | POA: Diagnosis not present

## 2017-04-09 DIAGNOSIS — Z794 Long term (current) use of insulin: Secondary | ICD-10-CM | POA: Diagnosis not present

## 2017-04-09 DIAGNOSIS — Z7982 Long term (current) use of aspirin: Secondary | ICD-10-CM | POA: Diagnosis not present

## 2017-04-09 DIAGNOSIS — E1142 Type 2 diabetes mellitus with diabetic polyneuropathy: Secondary | ICD-10-CM | POA: Diagnosis not present

## 2017-04-09 DIAGNOSIS — I130 Hypertensive heart and chronic kidney disease with heart failure and stage 1 through stage 4 chronic kidney disease, or unspecified chronic kidney disease: Secondary | ICD-10-CM | POA: Diagnosis not present

## 2017-04-09 DIAGNOSIS — D631 Anemia in chronic kidney disease: Secondary | ICD-10-CM | POA: Diagnosis not present

## 2017-04-09 DIAGNOSIS — N183 Chronic kidney disease, stage 3 (moderate): Secondary | ICD-10-CM | POA: Diagnosis not present

## 2017-04-09 DIAGNOSIS — I350 Nonrheumatic aortic (valve) stenosis: Secondary | ICD-10-CM | POA: Diagnosis not present

## 2017-04-09 DIAGNOSIS — I2510 Atherosclerotic heart disease of native coronary artery without angina pectoris: Secondary | ICD-10-CM | POA: Diagnosis not present

## 2017-04-09 DIAGNOSIS — I5033 Acute on chronic diastolic (congestive) heart failure: Secondary | ICD-10-CM | POA: Diagnosis not present

## 2017-04-09 DIAGNOSIS — Z7951 Long term (current) use of inhaled steroids: Secondary | ICD-10-CM | POA: Diagnosis not present

## 2017-04-09 DIAGNOSIS — E1151 Type 2 diabetes mellitus with diabetic peripheral angiopathy without gangrene: Secondary | ICD-10-CM | POA: Diagnosis not present

## 2017-04-15 ENCOUNTER — Telehealth: Payer: Self-pay | Admitting: Sports Medicine

## 2017-04-15 ENCOUNTER — Telehealth: Payer: Self-pay

## 2017-04-15 DIAGNOSIS — I5033 Acute on chronic diastolic (congestive) heart failure: Secondary | ICD-10-CM | POA: Diagnosis not present

## 2017-04-15 DIAGNOSIS — E1142 Type 2 diabetes mellitus with diabetic polyneuropathy: Secondary | ICD-10-CM | POA: Diagnosis not present

## 2017-04-15 DIAGNOSIS — Z8631 Personal history of diabetic foot ulcer: Secondary | ICD-10-CM | POA: Diagnosis not present

## 2017-04-15 DIAGNOSIS — I2510 Atherosclerotic heart disease of native coronary artery without angina pectoris: Secondary | ICD-10-CM | POA: Diagnosis not present

## 2017-04-15 DIAGNOSIS — I130 Hypertensive heart and chronic kidney disease with heart failure and stage 1 through stage 4 chronic kidney disease, or unspecified chronic kidney disease: Secondary | ICD-10-CM | POA: Diagnosis not present

## 2017-04-15 DIAGNOSIS — E1122 Type 2 diabetes mellitus with diabetic chronic kidney disease: Secondary | ICD-10-CM | POA: Diagnosis not present

## 2017-04-15 DIAGNOSIS — I350 Nonrheumatic aortic (valve) stenosis: Secondary | ICD-10-CM | POA: Diagnosis not present

## 2017-04-15 DIAGNOSIS — Z7982 Long term (current) use of aspirin: Secondary | ICD-10-CM | POA: Diagnosis not present

## 2017-04-15 DIAGNOSIS — Z794 Long term (current) use of insulin: Secondary | ICD-10-CM | POA: Diagnosis not present

## 2017-04-15 DIAGNOSIS — E1151 Type 2 diabetes mellitus with diabetic peripheral angiopathy without gangrene: Secondary | ICD-10-CM | POA: Diagnosis not present

## 2017-04-15 DIAGNOSIS — E11610 Type 2 diabetes mellitus with diabetic neuropathic arthropathy: Secondary | ICD-10-CM | POA: Diagnosis not present

## 2017-04-15 DIAGNOSIS — N183 Chronic kidney disease, stage 3 (moderate): Secondary | ICD-10-CM | POA: Diagnosis not present

## 2017-04-15 DIAGNOSIS — Z7951 Long term (current) use of inhaled steroids: Secondary | ICD-10-CM | POA: Diagnosis not present

## 2017-04-15 DIAGNOSIS — D631 Anemia in chronic kidney disease: Secondary | ICD-10-CM | POA: Diagnosis not present

## 2017-04-15 DIAGNOSIS — I5022 Chronic systolic (congestive) heart failure: Secondary | ICD-10-CM | POA: Diagnosis not present

## 2017-04-15 NOTE — Telephone Encounter (Signed)
Informed patient of referral to [facility] and gave her contact info

## 2017-04-15 NOTE — Telephone Encounter (Signed)
Patient's sister called answering service stating that she was concerned about her sister, continuing to have increased swelling and leaking fluid from legs. Patient's sister states that she is worried and thinks that she should go to [facility]. I told sister that we will have office nurse to give her a phone call to further discuss. Referral was made on 11/26 to [facility] for lymphedema management. -Dr. Marylene Land

## 2017-04-17 DIAGNOSIS — D631 Anemia in chronic kidney disease: Secondary | ICD-10-CM | POA: Diagnosis not present

## 2017-04-17 DIAGNOSIS — I350 Nonrheumatic aortic (valve) stenosis: Secondary | ICD-10-CM | POA: Diagnosis not present

## 2017-04-17 DIAGNOSIS — I2510 Atherosclerotic heart disease of native coronary artery without angina pectoris: Secondary | ICD-10-CM | POA: Diagnosis not present

## 2017-04-17 DIAGNOSIS — Z7951 Long term (current) use of inhaled steroids: Secondary | ICD-10-CM | POA: Diagnosis not present

## 2017-04-17 DIAGNOSIS — E11610 Type 2 diabetes mellitus with diabetic neuropathic arthropathy: Secondary | ICD-10-CM | POA: Diagnosis not present

## 2017-04-17 DIAGNOSIS — N183 Chronic kidney disease, stage 3 (moderate): Secondary | ICD-10-CM | POA: Diagnosis not present

## 2017-04-17 DIAGNOSIS — Z7982 Long term (current) use of aspirin: Secondary | ICD-10-CM | POA: Diagnosis not present

## 2017-04-17 DIAGNOSIS — I5022 Chronic systolic (congestive) heart failure: Secondary | ICD-10-CM | POA: Diagnosis not present

## 2017-04-17 DIAGNOSIS — Z794 Long term (current) use of insulin: Secondary | ICD-10-CM | POA: Diagnosis not present

## 2017-04-17 DIAGNOSIS — I5033 Acute on chronic diastolic (congestive) heart failure: Secondary | ICD-10-CM | POA: Diagnosis not present

## 2017-04-17 DIAGNOSIS — E1122 Type 2 diabetes mellitus with diabetic chronic kidney disease: Secondary | ICD-10-CM | POA: Diagnosis not present

## 2017-04-17 DIAGNOSIS — E1142 Type 2 diabetes mellitus with diabetic polyneuropathy: Secondary | ICD-10-CM | POA: Diagnosis not present

## 2017-04-17 DIAGNOSIS — E1151 Type 2 diabetes mellitus with diabetic peripheral angiopathy without gangrene: Secondary | ICD-10-CM | POA: Diagnosis not present

## 2017-04-17 DIAGNOSIS — Z8631 Personal history of diabetic foot ulcer: Secondary | ICD-10-CM | POA: Diagnosis not present

## 2017-04-17 DIAGNOSIS — I130 Hypertensive heart and chronic kidney disease with heart failure and stage 1 through stage 4 chronic kidney disease, or unspecified chronic kidney disease: Secondary | ICD-10-CM | POA: Diagnosis not present

## 2017-04-25 DIAGNOSIS — N3000 Acute cystitis without hematuria: Secondary | ICD-10-CM | POA: Diagnosis not present

## 2017-04-27 DIAGNOSIS — M14671 Charcot's joint, right ankle and foot: Secondary | ICD-10-CM | POA: Diagnosis not present

## 2017-04-27 DIAGNOSIS — L97412 Non-pressure chronic ulcer of right heel and midfoot with fat layer exposed: Secondary | ICD-10-CM | POA: Diagnosis not present

## 2017-04-27 DIAGNOSIS — E11621 Type 2 diabetes mellitus with foot ulcer: Secondary | ICD-10-CM | POA: Diagnosis not present

## 2017-04-27 DIAGNOSIS — M79671 Pain in right foot: Secondary | ICD-10-CM | POA: Diagnosis not present

## 2017-04-28 ENCOUNTER — Telehealth: Payer: Self-pay | Admitting: Sports Medicine

## 2017-04-28 DIAGNOSIS — D649 Anemia, unspecified: Secondary | ICD-10-CM | POA: Diagnosis not present

## 2017-04-28 NOTE — Telephone Encounter (Signed)
Faxed required form, LOV and demographics.

## 2017-04-28 NOTE — Telephone Encounter (Signed)
Emily Miranda [facility] states her supervisor will not allow pt to be accepted back in to their agency due to pt's non-compliance - pt allows legs to dangle and has high sodium diet.

## 2017-04-28 NOTE — Telephone Encounter (Signed)
Patient says home health is no longer coming to see her because she didn't have any open wounds.  She would like them to begin coming again because she has three open wounds that burn, sting and bleed when she changes the dressing.

## 2017-04-28 NOTE — Telephone Encounter (Signed)
Emily Miranda  Can we re-order home nursing for patient. Profore dressings bilateral Thanks Dr. Kathie Rhodes

## 2017-04-28 NOTE — Telephone Encounter (Addendum)
[facility] states DR. Jeanie Sewer is referring pt to [facility], requested fax clinicals to Iris at [phone]. I reviewed clinicals and phone records pt was referred to [facility] on 04/08/2017. I spoke with Luciano Cutter and she states [facility] states fax to [phone]. I told Iris I would refax to [facility]. I called [facility] [phone] - [facility] states fax to them and they schedule for Wound Ctr [phone].

## 2017-04-29 NOTE — Telephone Encounter (Signed)
Faxed required form, clinicals and demographics to [facility]. 

## 2017-04-29 NOTE — Telephone Encounter (Signed)
Thanks

## 2017-05-01 ENCOUNTER — Other Ambulatory Visit: Payer: Medicare Other | Admitting: *Deleted

## 2017-05-01 ENCOUNTER — Ambulatory Visit: Payer: Medicare Other | Admitting: Sports Medicine

## 2017-05-01 NOTE — Telephone Encounter (Signed)
Emily Miranda states they do not take lymphedema pt, and referred to [place] - they did not have the staff, and another [facility] agency stated unable to provide lymphedema therapy due to documentation did not state weeping, wound or skin breakdown.

## 2017-05-01 NOTE — Telephone Encounter (Signed)
I don't know if they have seen her yet at [facility]. She may still be waiting to be seen -Dr. Marylene Land

## 2017-05-02 NOTE — Telephone Encounter (Signed)
Left message Emily Miranda requesting agencies that will treat pt's lymphedema, because our documentation does state pt has weeping drainage.

## 2017-05-02 NOTE — Telephone Encounter (Signed)
My last note did state weeping drainage if we can ask that last [facility] agency to re-consider if not. We need to follow up with patient to see if she made it over to [facility] for wound care/edema management Thanks Dr. Marylene Land

## 2017-05-02 NOTE — Telephone Encounter (Signed)
Emily Miranda states refax the referral and he will take care of getting pt in for the wound/edema care. Faxed required form, clinicals and demographics.

## 2017-05-02 NOTE — Telephone Encounter (Signed)
Emily Miranda states [facility] will take care of pt.

## 2017-05-12 ENCOUNTER — Ambulatory Visit: Payer: Medicare Other | Admitting: Podiatry

## 2017-05-13 DIAGNOSIS — I872 Venous insufficiency (chronic) (peripheral): Secondary | ICD-10-CM

## 2017-05-13 HISTORY — DX: Venous insufficiency (chronic) (peripheral): I87.2

## 2017-05-15 ENCOUNTER — Telehealth: Payer: Self-pay | Admitting: *Deleted

## 2017-05-15 DIAGNOSIS — I739 Peripheral vascular disease, unspecified: Secondary | ICD-10-CM

## 2017-05-15 DIAGNOSIS — I890 Lymphedema, not elsewhere classified: Secondary | ICD-10-CM

## 2017-05-15 DIAGNOSIS — S80822A Blister (nonthermal), left lower leg, initial encounter: Secondary | ICD-10-CM

## 2017-05-15 NOTE — Telephone Encounter (Signed)
Pt states she was discharged from [facility] due to wound healed, now has wound on her legs again.

## 2017-05-15 NOTE — Telephone Encounter (Signed)
I spoke with pt states her legs are so swollen she can not walk and can't get in and out of a car to go to the doctor office. I offered pt an appt for Dr. Marylene Land to evaluate for wound care. Pt states she has an appt to be seen at [facility] 06/18/2017 and would like [facility] until then for wound care.

## 2017-05-16 NOTE — Telephone Encounter (Signed)
The only thing we can offer until her appt at [facility] is [facility] that does lymphedema management which may be difficult to find -Dr. Marylene Land

## 2017-05-19 NOTE — Telephone Encounter (Signed)
I informed pt of new wound care orders and [facility] care orders.

## 2017-05-19 NOTE — Telephone Encounter (Signed)
I spoke with Melissa - [facility] and she states they do Lymphedema care with skilled wound care. Dr. Marylene Land states pt has blisters to left leg and needs Profore weekly to B/L lower extremities. Orders faxed to [facility].

## 2017-05-23 ENCOUNTER — Telehealth: Payer: Self-pay | Admitting: *Deleted

## 2017-05-23 MED ORDER — MUPIROCIN CALCIUM 2 % EX CREA: 1.0000 "application " | TOPICAL_CREAM | Freq: Two times a day (BID) | CUTANEOUS | 0 refills | Status: AC

## 2017-05-23 NOTE — Telephone Encounter (Signed)
-----   Message from Asencion Islam, [place] sent at 05/23/2017 11:21 AM EST ----- Regarding: Home health  Can you call patient's daughter for me? She called concerned about home health that her mother is getting and requested to be spoken to immediately. But I'm in the middle of patients and can not talk to her right now [phone] Thanks Dr. Kathie Rhodes

## 2017-05-23 NOTE — Telephone Encounter (Signed)
I informed Laurena Bering and Carollee Herter [facility] of Dr. [name] 05/23/2017 1:31pm orders and they said they did not have the staff to perform the daily bactroban dressings and wraps to pt's legs as ordered by Dr. Marylene Land and did not feel pt and dtr would be able to maintain the dressing changes daily, and pt should be encouraged to go to [facility] ED.

## 2017-05-23 NOTE — Telephone Encounter (Signed)
I spoke with dtr, Zella Ball states pt's leg looks bad and [facility] will not be able to see pt until 06/2017. Zella Ball states [facility] thought pt had appt with [facility] on the 05/05/2017 and did not go to see pt, and cancelled pt's home health care for that day. I told Zella Ball I had spoke with pt and offered pt an appt, but pt had refused stating she was a fall risk and her legs were so enlarged she could not get into a automobile. Dr. Marylene Land gave orders for Lymphedema therapy and wound care until pt is seen in February at [facility]. I spoke with Melissa - [facility] and she has said they could perform Lymphedema therapy and [facility] orders were faxed on 05/15/2017.

## 2017-05-23 NOTE — Telephone Encounter (Signed)
[name] - [facility] transferred to [employer] - Supervisor. Ms Emily Miranda states she spent 45 minutes on the phone with Zella Ball explaining that unless the cause of the lymphedema is not treated the lymphedema is going to continue and the Profore or other dressings will continue to be saturated. Ms Emily Miranda suggested pt go to the hospital ED for treatment or possible admission, and if necessary go have EMS transport to [facility].

## 2017-05-23 NOTE — Telephone Encounter (Signed)
Dr. Marylene Land states order Bactroban cream for wounds to legs cover with 4x4, abd pads and kerlix and cover with ace dressings to the knees, extra padding to right ankle to decrease rubbing at the deformity site, and recommend pt go to [facility] ED if symptoms continue, to possibly be medicated to remove the fluid.

## 2017-05-23 NOTE — Telephone Encounter (Signed)
Pattricia Boss - [facility] is in a meeting and checked status of the case. Pattricia Boss told me that pt had not been accepted in to [facility] lymphedema care. I told Pattricia Boss I had received no notification that pt was not accepted into their [facility] program and Pattricia Boss states she will have Melissa contact me.

## 2017-05-23 NOTE — Telephone Encounter (Signed)
I asked pt's dtr, Zella Ball to email photos to me to send to Dr. Marylene Land. I offered an appt today with Dr. Marylene Land, and Zella Ball states her ftr, is having emergency surgery today.

## 2017-05-23 NOTE — Telephone Encounter (Signed)
Emily Miranda - states called [phone] and left message that they could not accept pt on 05/20/2017.

## 2017-05-23 NOTE — Telephone Encounter (Signed)
Thanks. I replied back.  -Dr. Marylene Land

## 2017-05-23 NOTE — Telephone Encounter (Signed)
I spoke with [name] and he states he can begin pt's care and teaching 05/26/2017. Faxed Dr. [name] orders for wound care and leg wraps to [facility]. I emailed Robin Dr. [name] instructions and called informing Zella Ball of the [facility] acceptance for [facility] and she asked to have [facility] contact her. I faxed required form, clinicals and demographics with Robin's contact number to [facility].

## 2017-05-23 NOTE — Telephone Encounter (Signed)
I spoke with Zella Ball and informed of Dr. [name] orders and she states she and her brother can take care of the dressing changes if a nurse came 3 x week, and will look at getting pt in to [facility] ED. Robin asked to have the pictures she sent, and Dr. [name] instruction sent to her so she would be able to instruct her brother who lives right beside their parents, due to the weather and that she works for a [employer] station she will have to have her brother help.

## 2017-05-28 DIAGNOSIS — L97412 Non-pressure chronic ulcer of right heel and midfoot with fat layer exposed: Secondary | ICD-10-CM | POA: Diagnosis not present

## 2017-05-28 DIAGNOSIS — M79671 Pain in right foot: Secondary | ICD-10-CM | POA: Diagnosis not present

## 2017-05-28 DIAGNOSIS — M14671 Charcot's joint, right ankle and foot: Secondary | ICD-10-CM | POA: Diagnosis not present

## 2017-05-28 DIAGNOSIS — E11621 Type 2 diabetes mellitus with foot ulcer: Secondary | ICD-10-CM | POA: Diagnosis not present

## 2017-05-29 ENCOUNTER — Inpatient Hospital Stay (HOSPITAL_COMMUNITY): Payer: Medicare Other

## 2017-05-29 ENCOUNTER — Telehealth: Payer: Self-pay | Admitting: *Deleted

## 2017-05-29 ENCOUNTER — Encounter (HOSPITAL_COMMUNITY): Payer: Self-pay | Admitting: Physician Assistant

## 2017-05-29 ENCOUNTER — Telehealth: Payer: Self-pay | Admitting: Sports Medicine

## 2017-05-29 ENCOUNTER — Inpatient Hospital Stay (HOSPITAL_COMMUNITY)
Admission: EM | Admit: 2017-05-29 | Discharge: 2017-06-03 | DRG: 683 | Disposition: A | Discharge status: Skilled Nursing Facility | Payer: Medicare Other | Attending: Internal Medicine | Admitting: Internal Medicine

## 2017-05-29 DIAGNOSIS — R739 Hyperglycemia, unspecified: Secondary | ICD-10-CM | POA: Diagnosis not present

## 2017-05-29 DIAGNOSIS — Z993 Dependence on wheelchair: Secondary | ICD-10-CM

## 2017-05-29 DIAGNOSIS — Z886 Allergy status to analgesic agent status: Secondary | ICD-10-CM

## 2017-05-29 DIAGNOSIS — M25552 Pain in left hip: Secondary | ICD-10-CM

## 2017-05-29 DIAGNOSIS — L97519 Non-pressure chronic ulcer of other part of right foot with unspecified severity: Secondary | ICD-10-CM | POA: Diagnosis present

## 2017-05-29 DIAGNOSIS — N179 Acute kidney failure, unspecified: Secondary | ICD-10-CM | POA: Diagnosis not present

## 2017-05-29 DIAGNOSIS — W1830XA Fall on same level, unspecified, initial encounter: Secondary | ICD-10-CM | POA: Diagnosis present

## 2017-05-29 DIAGNOSIS — N183 Chronic kidney disease, stage 3 unspecified: Secondary | ICD-10-CM | POA: Diagnosis present

## 2017-05-29 DIAGNOSIS — T502X5A Adverse effect of carbonic-anhydrase inhibitors, benzothiadiazides and other diuretics, initial encounter: Secondary | ICD-10-CM | POA: Diagnosis present

## 2017-05-29 DIAGNOSIS — K5900 Constipation, unspecified: Secondary | ICD-10-CM | POA: Diagnosis present

## 2017-05-29 DIAGNOSIS — I252 Old myocardial infarction: Secondary | ICD-10-CM | POA: Diagnosis not present

## 2017-05-29 DIAGNOSIS — M1460 Charcot's joint, unspecified site: Secondary | ICD-10-CM | POA: Diagnosis not present

## 2017-05-29 DIAGNOSIS — I5032 Chronic diastolic (congestive) heart failure: Secondary | ICD-10-CM | POA: Diagnosis not present

## 2017-05-29 DIAGNOSIS — Z79899 Other long term (current) drug therapy: Secondary | ICD-10-CM

## 2017-05-29 DIAGNOSIS — R609 Edema, unspecified: Secondary | ICD-10-CM | POA: Diagnosis not present

## 2017-05-29 DIAGNOSIS — E875 Hyperkalemia: Secondary | ICD-10-CM | POA: Diagnosis present

## 2017-05-29 DIAGNOSIS — K8581 Other acute pancreatitis with uninfected necrosis: Secondary | ICD-10-CM | POA: Diagnosis not present

## 2017-05-29 DIAGNOSIS — K5909 Other constipation: Secondary | ICD-10-CM | POA: Diagnosis present

## 2017-05-29 DIAGNOSIS — L8992 Pressure ulcer of unspecified site, stage 2: Secondary | ICD-10-CM | POA: Diagnosis not present

## 2017-05-29 DIAGNOSIS — E1122 Type 2 diabetes mellitus with diabetic chronic kidney disease: Secondary | ICD-10-CM | POA: Diagnosis present

## 2017-05-29 DIAGNOSIS — I129 Hypertensive chronic kidney disease with stage 1 through stage 4 chronic kidney disease, or unspecified chronic kidney disease: Secondary | ICD-10-CM | POA: Diagnosis not present

## 2017-05-29 DIAGNOSIS — D631 Anemia in chronic kidney disease: Secondary | ICD-10-CM | POA: Diagnosis present

## 2017-05-29 DIAGNOSIS — I890 Lymphedema, not elsewhere classified: Secondary | ICD-10-CM | POA: Diagnosis present

## 2017-05-29 DIAGNOSIS — E11621 Type 2 diabetes mellitus with foot ulcer: Secondary | ICD-10-CM | POA: Diagnosis present

## 2017-05-29 DIAGNOSIS — D638 Anemia in other chronic diseases classified elsewhere: Secondary | ICD-10-CM | POA: Diagnosis not present

## 2017-05-29 DIAGNOSIS — E11610 Type 2 diabetes mellitus with diabetic neuropathic arthropathy: Secondary | ICD-10-CM | POA: Diagnosis present

## 2017-05-29 DIAGNOSIS — Z955 Presence of coronary angioplasty implant and graft: Secondary | ICD-10-CM

## 2017-05-29 DIAGNOSIS — I493 Ventricular premature depolarization: Secondary | ICD-10-CM | POA: Diagnosis present

## 2017-05-29 DIAGNOSIS — L03115 Cellulitis of right lower limb: Secondary | ICD-10-CM

## 2017-05-29 DIAGNOSIS — L03119 Cellulitis of unspecified part of limb: Secondary | ICD-10-CM

## 2017-05-29 DIAGNOSIS — M1612 Unilateral primary osteoarthritis, left hip: Secondary | ICD-10-CM | POA: Diagnosis present

## 2017-05-29 DIAGNOSIS — E860 Dehydration: Secondary | ICD-10-CM | POA: Diagnosis present

## 2017-05-29 DIAGNOSIS — E1140 Type 2 diabetes mellitus with diabetic neuropathy, unspecified: Secondary | ICD-10-CM | POA: Diagnosis present

## 2017-05-29 DIAGNOSIS — I2510 Atherosclerotic heart disease of native coronary artery without angina pectoris: Secondary | ICD-10-CM | POA: Diagnosis present

## 2017-05-29 DIAGNOSIS — S90929A Unspecified superficial injury of unspecified foot, initial encounter: Secondary | ICD-10-CM | POA: Diagnosis not present

## 2017-05-29 DIAGNOSIS — G629 Polyneuropathy, unspecified: Secondary | ICD-10-CM | POA: Diagnosis present

## 2017-05-29 DIAGNOSIS — Z9089 Acquired absence of other organs: Secondary | ICD-10-CM | POA: Diagnosis not present

## 2017-05-29 DIAGNOSIS — E1151 Type 2 diabetes mellitus with diabetic peripheral angiopathy without gangrene: Secondary | ICD-10-CM | POA: Diagnosis present

## 2017-05-29 DIAGNOSIS — E785 Hyperlipidemia, unspecified: Secondary | ICD-10-CM | POA: Diagnosis present

## 2017-05-29 DIAGNOSIS — R402414 Glasgow coma scale score 13-15, 24 hours or more after hospital admission: Secondary | ICD-10-CM | POA: Diagnosis not present

## 2017-05-29 DIAGNOSIS — I739 Peripheral vascular disease, unspecified: Secondary | ICD-10-CM | POA: Diagnosis present

## 2017-05-29 DIAGNOSIS — I878 Other specified disorders of veins: Secondary | ICD-10-CM | POA: Diagnosis present

## 2017-05-29 DIAGNOSIS — R531 Weakness: Secondary | ICD-10-CM | POA: Diagnosis not present

## 2017-05-29 DIAGNOSIS — T501X5A Adverse effect of loop [high-ceiling] diuretics, initial encounter: Secondary | ICD-10-CM | POA: Diagnosis present

## 2017-05-29 DIAGNOSIS — T464X5A Adverse effect of angiotensin-converting-enzyme inhibitors, initial encounter: Secondary | ICD-10-CM | POA: Diagnosis present

## 2017-05-29 DIAGNOSIS — I43 Cardiomyopathy in diseases classified elsewhere: Secondary | ICD-10-CM | POA: Diagnosis present

## 2017-05-29 DIAGNOSIS — E119 Type 2 diabetes mellitus without complications: Secondary | ICD-10-CM | POA: Diagnosis not present

## 2017-05-29 DIAGNOSIS — D649 Anemia, unspecified: Secondary | ICD-10-CM | POA: Diagnosis present

## 2017-05-29 DIAGNOSIS — I872 Venous insufficiency (chronic) (peripheral): Secondary | ICD-10-CM | POA: Diagnosis present

## 2017-05-29 DIAGNOSIS — Z7902 Long term (current) use of antithrombotics/antiplatelets: Secondary | ICD-10-CM

## 2017-05-29 DIAGNOSIS — I130 Hypertensive heart and chronic kidney disease with heart failure and stage 1 through stage 4 chronic kidney disease, or unspecified chronic kidney disease: Secondary | ICD-10-CM | POA: Diagnosis present

## 2017-05-29 DIAGNOSIS — E1165 Type 2 diabetes mellitus with hyperglycemia: Secondary | ICD-10-CM | POA: Diagnosis present

## 2017-05-29 DIAGNOSIS — T39015A Adverse effect of aspirin, initial encounter: Secondary | ICD-10-CM | POA: Diagnosis present

## 2017-05-29 DIAGNOSIS — S79912A Unspecified injury of left hip, initial encounter: Secondary | ICD-10-CM | POA: Diagnosis not present

## 2017-05-29 DIAGNOSIS — T43615A Adverse effect of caffeine, initial encounter: Secondary | ICD-10-CM | POA: Diagnosis present

## 2017-05-29 DIAGNOSIS — I219 Acute myocardial infarction, unspecified: Secondary | ICD-10-CM | POA: Diagnosis not present

## 2017-05-29 DIAGNOSIS — I5042 Chronic combined systolic (congestive) and diastolic (congestive) heart failure: Secondary | ICD-10-CM | POA: Diagnosis present

## 2017-05-29 DIAGNOSIS — T500X5A Adverse effect of mineralocorticoids and their antagonists, initial encounter: Secondary | ICD-10-CM | POA: Diagnosis present

## 2017-05-29 DIAGNOSIS — G9009 Other idiopathic peripheral autonomic neuropathy: Secondary | ICD-10-CM | POA: Diagnosis not present

## 2017-05-29 DIAGNOSIS — T391X5A Adverse effect of 4-Aminophenol derivatives, initial encounter: Secondary | ICD-10-CM | POA: Diagnosis present

## 2017-05-29 DIAGNOSIS — N393 Stress incontinence (female) (male): Secondary | ICD-10-CM | POA: Diagnosis present

## 2017-05-29 DIAGNOSIS — R5381 Other malaise: Secondary | ICD-10-CM | POA: Diagnosis not present

## 2017-05-29 DIAGNOSIS — I110 Hypertensive heart disease with heart failure: Secondary | ICD-10-CM | POA: Diagnosis present

## 2017-05-29 DIAGNOSIS — K219 Gastro-esophageal reflux disease without esophagitis: Secondary | ICD-10-CM | POA: Diagnosis not present

## 2017-05-29 DIAGNOSIS — I350 Nonrheumatic aortic (valve) stenosis: Secondary | ICD-10-CM | POA: Diagnosis present

## 2017-05-29 DIAGNOSIS — N390 Urinary tract infection, site not specified: Secondary | ICD-10-CM | POA: Diagnosis not present

## 2017-05-29 DIAGNOSIS — R600 Localized edema: Secondary | ICD-10-CM | POA: Diagnosis not present

## 2017-05-29 DIAGNOSIS — X58XXXA Exposure to other specified factors, initial encounter: Secondary | ICD-10-CM | POA: Diagnosis present

## 2017-05-29 DIAGNOSIS — Z9861 Coronary angioplasty status: Secondary | ICD-10-CM | POA: Diagnosis not present

## 2017-05-29 DIAGNOSIS — Z881 Allergy status to other antibiotic agents status: Secondary | ICD-10-CM

## 2017-05-29 DIAGNOSIS — Z794 Long term (current) use of insulin: Secondary | ICD-10-CM

## 2017-05-29 DIAGNOSIS — Z888 Allergy status to other drugs, medicaments and biological substances status: Secondary | ICD-10-CM

## 2017-05-29 HISTORY — DX: Acute kidney failure, unspecified: N17.9

## 2017-05-29 HISTORY — DX: Venous insufficiency (chronic) (peripheral): I87.2

## 2017-05-29 LAB — LACTIC ACID, PLASMA: Lactic Acid, Venous: 0.9 mmol/L (ref 0.5–1.9)

## 2017-05-29 LAB — CBC WITH DIFFERENTIAL/PLATELET
Basophils Absolute: 0 10*3/uL (ref 0.0–0.1)
Basophils Relative: 0 %
Eosinophils Absolute: 0.2 10*3/uL (ref 0.0–0.7)
Eosinophils Relative: 2 %
HCT: 30.1 % — ABNORMAL LOW (ref 36.0–46.0)
Hemoglobin: 9.7 g/dL — ABNORMAL LOW (ref 12.0–15.0)
Lymphocytes Relative: 20 %
Lymphs Abs: 1.4 10*3/uL (ref 0.7–4.0)
MCH: 27.2 pg (ref 26.0–34.0)
MCHC: 32.2 g/dL (ref 30.0–36.0)
MCV: 84.3 fL (ref 78.0–100.0)
Monocytes Absolute: 0.6 10*3/uL (ref 0.1–1.0)
Monocytes Relative: 8 %
Neutro Abs: 5.2 10*3/uL (ref 1.7–7.7)
Neutrophils Relative %: 70 %
Platelets: 238 10*3/uL (ref 150–400)
RBC: 3.57 MIL/uL — ABNORMAL LOW (ref 3.87–5.11)
RDW: 17.6 % — ABNORMAL HIGH (ref 11.5–15.5)
WBC: 7.4 10*3/uL (ref 4.0–10.5)

## 2017-05-29 LAB — COMPREHENSIVE METABOLIC PANEL
ALT: 15 U/L (ref 14–54)
AST: 13 U/L — ABNORMAL LOW (ref 15–41)
Albumin: 2.6 g/dL — ABNORMAL LOW (ref 3.5–5.0)
Alkaline Phosphatase: 107 U/L (ref 38–126)
Anion gap: 10 (ref 5–15)
BUN: 78 mg/dL — ABNORMAL HIGH (ref 6–20)
CO2: 13 mmol/L — ABNORMAL LOW (ref 22–32)
Calcium: 8.7 mg/dL — ABNORMAL LOW (ref 8.9–10.3)
Chloride: 110 mmol/L (ref 101–111)
Creatinine, Ser: 3.03 mg/dL — ABNORMAL HIGH (ref 0.44–1.00)
GFR calc Af Amer: 16 mL/min — ABNORMAL LOW (ref >60)
GFR calc non Af Amer: 14 mL/min — ABNORMAL LOW (ref >60)
Glucose, Bld: 282 mg/dL — ABNORMAL HIGH (ref 65–99)
Potassium: 6 mmol/L — ABNORMAL HIGH (ref 3.5–5.1)
Sodium: 133 mmol/L — ABNORMAL LOW (ref 135–145)
Total Bilirubin: 0.4 mg/dL (ref 0.3–1.2)
Total Protein: 5.9 g/dL — ABNORMAL LOW (ref 6.5–8.1)

## 2017-05-29 LAB — BASIC METABOLIC PANEL
Anion gap: 9 (ref 5–15)
BUN: 75 mg/dL — ABNORMAL HIGH (ref 6–20)
CO2: 15 mmol/L — ABNORMAL LOW (ref 22–32)
Calcium: 8.6 mg/dL — ABNORMAL LOW (ref 8.9–10.3)
Chloride: 110 mmol/L (ref 101–111)
Creatinine, Ser: 2.5 mg/dL — ABNORMAL HIGH (ref 0.44–1.00)
GFR calc Af Amer: 21 mL/min — ABNORMAL LOW (ref >60)
GFR calc non Af Amer: 18 mL/min — ABNORMAL LOW (ref >60)
Glucose, Bld: 227 mg/dL — ABNORMAL HIGH (ref 65–99)
Potassium: 5.4 mmol/L — ABNORMAL HIGH (ref 3.5–5.1)
Sodium: 134 mmol/L — ABNORMAL LOW (ref 135–145)

## 2017-05-29 LAB — GLUCOSE, CAPILLARY: Glucose-Capillary: 185 mg/dL — ABNORMAL HIGH (ref 65–99)

## 2017-05-29 LAB — HEMOGLOBIN A1C
Hgb A1c MFr Bld: 11.8 % — ABNORMAL HIGH (ref 4.8–5.6)
Mean Plasma Glucose: 291.96 mg/dL

## 2017-05-29 LAB — TROPONIN I: Troponin I: 0.04 ng/mL (ref <0.03)

## 2017-05-29 LAB — SEDIMENTATION RATE: Sed Rate: 42 mm/hr — ABNORMAL HIGH (ref 0–22)

## 2017-05-29 LAB — C-REACTIVE PROTEIN: CRP: <0.8 mg/dL (ref <1.0)

## 2017-05-29 LAB — CBG MONITORING, ED: Glucose-Capillary: 228 mg/dL — ABNORMAL HIGH (ref 65–99)

## 2017-05-29 MED ORDER — POLYETHYLENE GLYCOL 3350 17 G PO PACK: 17.0000 g | PACK | Freq: Every day | ORAL | Status: DC | PRN

## 2017-05-29 MED ORDER — SODIUM CHLORIDE 0.9 % IV BOLUS (SEPSIS)
500.0000 mL | Freq: Once | INTRAVENOUS | Status: AC
  Administered 2017-05-29: 500 mL via INTRAVENOUS

## 2017-05-29 MED ORDER — CARVEDILOL 6.25 MG PO TABS
6.2500 mg | ORAL_TABLET | Freq: Two times a day (BID) | ORAL | Status: DC
  Administered 2017-05-30 – 2017-06-03 (×9): 6.25 mg via ORAL
  Filled 2017-05-29 (×9): qty 1

## 2017-05-29 MED ORDER — BISACODYL 10 MG RE SUPP: 10.0000 mg | Freq: Every day | RECTAL | Status: DC | PRN

## 2017-05-29 MED ORDER — HYDRALAZINE HCL 25 MG PO TABS
25.0000 mg | ORAL_TABLET | Freq: Three times a day (TID) | ORAL | Status: DC
  Administered 2017-05-29 – 2017-06-03 (×16): 25 mg via ORAL
  Filled 2017-05-29 (×16): qty 1

## 2017-05-29 MED ORDER — PANTOPRAZOLE SODIUM 40 MG PO TBEC
40.0000 mg | DELAYED_RELEASE_TABLET | Freq: Every day | ORAL | Status: DC
  Administered 2017-05-29 – 2017-06-03 (×6): 40 mg via ORAL
  Filled 2017-05-29 (×6): qty 1

## 2017-05-29 MED ORDER — POLYETHYL GLYCOL-PROPYL GLYCOL 0.4-0.3 % OP GEL: Freq: Every day | OPHTHALMIC | Status: DC | PRN

## 2017-05-29 MED ORDER — INSULIN ASPART 100 UNIT/ML ~~LOC~~ SOLN
0.0000 [IU] | Freq: Three times a day (TID) | SUBCUTANEOUS | Status: DC
  Administered 2017-05-29: 3 [IU] via SUBCUTANEOUS
  Administered 2017-05-30: 2 [IU] via SUBCUTANEOUS
  Administered 2017-05-30: 1 [IU] via SUBCUTANEOUS
  Administered 2017-05-30: 2 [IU] via SUBCUTANEOUS
  Administered 2017-05-31: 3 [IU] via SUBCUTANEOUS
  Administered 2017-05-31: 7 [IU] via SUBCUTANEOUS
  Administered 2017-05-31: 3 [IU] via SUBCUTANEOUS
  Administered 2017-06-01: 5 [IU] via SUBCUTANEOUS
  Administered 2017-06-01: 3 [IU] via SUBCUTANEOUS
  Administered 2017-06-01: 5 [IU] via SUBCUTANEOUS
  Administered 2017-06-02 (×3): 2 [IU] via SUBCUTANEOUS
  Administered 2017-06-03: 3 [IU] via SUBCUTANEOUS
  Filled 2017-05-29: qty 1

## 2017-05-29 MED ORDER — ONDANSETRON HCL 4 MG PO TABS: 4.0000 mg | ORAL_TABLET | Freq: Four times a day (QID) | ORAL | Status: DC | PRN

## 2017-05-29 MED ORDER — ISOSORBIDE MONONITRATE ER 30 MG PO TB24
30.0000 mg | ORAL_TABLET | Freq: Every day | ORAL | Status: DC
  Administered 2017-05-30 – 2017-06-03 (×5): 30 mg via ORAL
  Filled 2017-05-29 (×5): qty 1

## 2017-05-29 MED ORDER — DEXTROSE 5 % IV SOLN
1.0000 g | Freq: Once | INTRAVENOUS | Status: AC
  Administered 2017-05-29: 1 g via INTRAVENOUS
  Filled 2017-05-29: qty 10

## 2017-05-29 MED ORDER — VANCOMYCIN HCL IN DEXTROSE 750-5 MG/150ML-% IV SOLN
750.0000 mg | INTRAVENOUS | Status: DC
  Administered 2017-05-29: 750 mg via INTRAVENOUS
  Filled 2017-05-29: qty 150

## 2017-05-29 MED ORDER — ACETAMINOPHEN 650 MG RE SUPP: 650.0000 mg | Freq: Four times a day (QID) | RECTAL | Status: DC | PRN

## 2017-05-29 MED ORDER — ACETAMINOPHEN 325 MG PO TABS: 650.0000 mg | ORAL_TABLET | Freq: Four times a day (QID) | ORAL | Status: DC | PRN

## 2017-05-29 MED ORDER — MORPHINE SULFATE (PF) 4 MG/ML IV SOLN
1.0000 mg | INTRAVENOUS | Status: DC | PRN
  Administered 2017-05-30 – 2017-06-03 (×2): 1 mg via INTRAVENOUS
  Filled 2017-05-29 (×2): qty 1

## 2017-05-29 MED ORDER — SPIRONOLACTONE 25 MG PO TABS: 25.0000 mg | ORAL_TABLET | Freq: Every day | ORAL | Status: DC

## 2017-05-29 MED ORDER — ATORVASTATIN CALCIUM 40 MG PO TABS
40.0000 mg | ORAL_TABLET | Freq: Every day | ORAL | Status: DC
  Administered 2017-05-30 – 2017-06-02 (×4): 40 mg via ORAL
  Filled 2017-05-29 (×5): qty 1

## 2017-05-29 MED ORDER — INSULIN GLARGINE 100 UNIT/ML SOLOSTAR PEN: 30.0000 [IU] | PEN_INJECTOR | Freq: Every day | SUBCUTANEOUS | Status: DC

## 2017-05-29 MED ORDER — SENNOSIDES-DOCUSATE SODIUM 8.6-50 MG PO TABS: 1.0000 | ORAL_TABLET | Freq: Every evening | ORAL | Status: DC | PRN

## 2017-05-29 MED ORDER — CYCLOBENZAPRINE HCL 10 MG PO TABS
10.0000 mg | ORAL_TABLET | Freq: Three times a day (TID) | ORAL | Status: DC
  Administered 2017-05-29 – 2017-06-03 (×15): 10 mg via ORAL
  Filled 2017-05-29 (×15): qty 1

## 2017-05-29 MED ORDER — CLOPIDOGREL BISULFATE 75 MG PO TABS
75.0000 mg | ORAL_TABLET | Freq: Every day | ORAL | Status: DC
  Administered 2017-05-30 – 2017-06-03 (×5): 75 mg via ORAL
  Filled 2017-05-29 (×5): qty 1

## 2017-05-29 MED ORDER — SODIUM CHLORIDE 0.9 % IV SOLN
INTRAVENOUS | Status: DC
  Administered 2017-05-29 – 2017-05-31 (×4): via INTRAVENOUS

## 2017-05-29 MED ORDER — DEXTROSE 5 % IV SOLN
1.0000 g | INTRAVENOUS | Status: DC
  Filled 2017-05-29: qty 10

## 2017-05-29 MED ORDER — ONDANSETRON HCL 4 MG/2ML IJ SOLN: 4.0000 mg | Freq: Four times a day (QID) | INTRAMUSCULAR | Status: DC | PRN

## 2017-05-29 MED ORDER — HYDROCODONE-ACETAMINOPHEN 5-325 MG PO TABS
1.0000 | ORAL_TABLET | ORAL | Status: DC | PRN
  Administered 2017-05-29 (×3): 1 via ORAL
  Administered 2017-05-30: 2 via ORAL
  Administered 2017-05-30: 1 via ORAL
  Administered 2017-05-31: 2 via ORAL
  Filled 2017-05-29 (×2): qty 1
  Filled 2017-05-29: qty 2
  Filled 2017-05-29: qty 1
  Filled 2017-05-29: qty 2
  Filled 2017-05-29: qty 1

## 2017-05-29 MED ORDER — INSULIN GLARGINE 100 UNIT/ML ~~LOC~~ SOLN
30.0000 [IU] | Freq: Every day | SUBCUTANEOUS | Status: DC
  Administered 2017-05-29: 30 [IU] via SUBCUTANEOUS
  Filled 2017-05-29: qty 0.3

## 2017-05-29 MED ORDER — MUPIROCIN CALCIUM 2 % EX CREA
1.0000 "application " | TOPICAL_CREAM | Freq: Two times a day (BID) | CUTANEOUS | Status: DC
  Administered 2017-05-29 – 2017-06-03 (×10): 1 via TOPICAL
  Filled 2017-05-29 (×2): qty 15

## 2017-05-29 NOTE — ED Notes (Signed)
CBG: 228. RN notified.

## 2017-05-29 NOTE — ED Notes (Signed)
Pt returned from xray

## 2017-05-29 NOTE — Telephone Encounter (Signed)
I spoke with [name] states another pt had cancelled to continue her care with [facility], and had Kenard Gower had said the orders for this pt had been cancelled. I told [name] pt did not cancel on Friday, to find out status of pt's referral.

## 2017-05-29 NOTE — Telephone Encounter (Signed)
Emily Miranda [facility] called to check status of pt. I informed that pt had been accepted into [facility] for wound care and then a family member had called to state pt had been taken to [facility] ED.

## 2017-05-29 NOTE — Telephone Encounter (Signed)
Thanks. I will await to hear from the doctors who are caring for her to come in as a consult if they need me to see her. -Dr. Marylene Land

## 2017-05-29 NOTE — ED Provider Notes (Signed)
[facility] EMERGENCY DEPARTMENT Provider Note   CSN: [id number] Arrival date & time: 05/29/17  1058     History   Chief Complaint Chief Complaint  Patient presents with  . Leg Swelling    HPI Emily Miranda is a 77 y.o. female.  HPI Patient is a 77 year old female presents the emergency department secondary to worsening swelling and redness of her bilateral lower extremities.  She reports the weeping on the right leg is worse.  She reports worsening pain.  It was recommended by her primary team that she come to the ER for further evaluation.  No fevers at home.  She does not feel like she is not urinating as often as normal.  She has had increasing doses of Lasix at home to help with the swelling which does not seem to be improving her symptoms.  She is currently not following with the wound care team.  Symptoms are moderate in severity   Past Medical History:  Diagnosis Date  . Aortic stenosis, mild   . CAD (coronary artery disease)    a. 09/2016 NSTEMI/Cath: RCA 70p/m, RPDA 95ost-->Med Rx;  b. 10/2016 PCI: RCA 70p/m (2.25 x 15 Resolute Onyx DES), RPDA 95ost (2.0 x 12 Resolute Onyx DES).  . Charcot foot due to diabetes mellitus (HCC)   . Chronic combined systolic (congestive) and diastolic (congestive) heart failure (HCC)    a. 07/2016 Echo: EF 40-45%, Gr2 DD, mild AS, triv AI, mild MR, mod dil LA.  . Diabetes mellitus   . HTN (hypertension)   . Hyperlipidemia   . Myocardial infarction (HCC)   . Neuropathy   . Pancreatitis   . Physical deconditioning    a. W/C bound.    Patient Active Problem List   Diagnosis Date Noted  . Edema 11/24/2016  . Chronic combined systolic and diastolic heart failure (HCC) 11/09/2016  . Unstable angina (HCC) 11/04/2016  . Charcot foot due to diabetes mellitus (HCC) 10/31/2016  . Chronic anemia 10/31/2016  . Mixed hyperlipidemia   . PVD (peripheral vascular disease) (HCC)   . CAD S/P percutaneous coronary angioplasty     . Non-ST elevation (NSTEMI) myocardial infarction (HCC) 10/06/2016  . NSTEMI (non-ST elevated myocardial infarction) (HCC) 10/06/2016  . CKD (chronic kidney disease), stage III (HCC) 08/27/2016  . Chronic diabetic ulcer of right foot  08/27/2016  . Loss of weight   . Acute combined systolic and diastolic congestive heart failure (HCC)   . Shortness of breath 07/29/2016  . Wound infection 11/13/2015  . Abdominal pain, RLQ (right lower quadrant) -chronic since 2009 06/02/2013  . Constipation, chronic 06/02/2013  . SUI (stress urinary incontinence, female) 06/02/2013  . Recurrent UTI (urinary tract infection) 06/02/2013  . Hypertensive cardiomyopathy, with heart failure (HCC)   . CHEST PAIN 11/27/2009  . OVERWEIGHT 11/28/2008  . DM type 2 causing CKD stage 3 (HCC) 11/25/2008  . Mild aortic stenosis 11/25/2008  . PANCREATITIS, HX OF 11/25/2008    Past Surgical History:  Procedure Laterality Date  . ABDOMINAL HYSTERECTOMY    . APPENDECTOMY    . CORONARY STENT INTERVENTION N/A 11/05/2016   Procedure: Coronary Stent Intervention;  Surgeon: Lennette Bihari, MD;  Location: [facility] INVASIVE CV LAB;  Service: Cardiovascular;  Laterality: N/A;  . FOOT SURGERY    . LAPAROSCOPIC INCISIONAL / UMBILICAL / VENTRAL HERNIA REPAIR  01/04/2008   Dr Bertram Savin  . LAPAROSCOPIC LYSIS OF ADHESIONS  2007   Dr Donovan Kail  . LEFT HEART CATH  AND CORONARY ANGIOGRAPHY N/A 10/11/2016   Procedure: Left Heart Cath and Coronary Angiography;  Surgeon: Yvonne Kendall, MD;  Location: [facility] INVASIVE CV LAB;  Service: Cardiovascular;  Laterality: N/A;  . OVARIAN CYST REMOVAL    . ROBOT ASSISTED PYELOPLASTY  02/2007   Dr Laverle Patter  . SUTURE REMOVAL  08/17/2009   Dr Bertram Savin .  Right paramedian GoreTex stitch  . TONSILLECTOMY      OB History    No data available       Home Medications    Prior to Admission medications   Medication Sig Start Date End Date Taking? Authorizing Provider  Ascorbic Acid (VITAMIN C) 1000  MG tablet Take 1,000 mg by mouth daily.   Yes [provider]  aspirin-acetaminophen-caffeine (EXCEDRIN MIGRAINE) [phone] MG tablet Take 1 tablet by mouth every 6 (six) hours as needed for headache.   Yes [provider]  atorvastatin (LIPITOR) 40 MG tablet Take 1 tablet (40 mg total) by mouth daily at 6 PM. 08/05/16  Yes Garth Bigness, MD  carvedilol (COREG) 6.25 MG tablet Take 6.25 mg by mouth 2 (two) times daily with a meal.   Yes [provider]  clopidogrel (PLAVIX) 75 MG tablet Take 1 tablet (75 mg total) by mouth daily with breakfast. 11/10/16  Yes Creig Hines, NP  cyclobenzaprine (FLEXERIL) 5 MG tablet Take 10 mg by mouth 3 (three) times daily.  10/16/16  Yes [provider]  fexofenadine (ALLEGRA) 180 MG tablet Take 180 mg by mouth daily.   Yes [provider]  fluticasone (FLONASE) 50 MCG/ACT nasal spray Place 1 spray into both nostrils daily as needed (seasonal allergies).  02/05/16  Yes [provider]  furosemide (LASIX) 40 MG tablet Take 20 mg by mouth daily.    Yes [provider]  glimepiride (AMARYL) 2 MG tablet Take 4 mg by mouth daily with breakfast.    Yes [provider]  glimepiride (AMARYL) 4 MG tablet Take 2-4 mg by mouth See admin instructions. 4mg  in am, 2mg  in pm 05/17/17  Yes [provider]  hydrALAZINE (APRESOLINE) 25 MG tablet Take 25 mg by mouth every 8 (eight) hours.   Yes [provider]  isosorbide mononitrate (IMDUR) 30 MG 24 hr tablet Take 1 tablet (30 mg total) by mouth daily. 11/09/16  Yes Creig Hines, NP  LANTUS SOLOSTAR 100 UNIT/ML Solostar Pen Inject 30 Units into the skin at bedtime. 05/18/17  Yes [provider]  linagliptin (TRADJENTA) 5 MG TABS tablet Take 5 mg by mouth daily.   Yes [provider]  lisinopril (PRINIVIL,ZESTRIL) 30 MG tablet Take 30 mg by mouth daily.   Yes [provider]  mupirocin cream  (BACTROBAN) 2 % Apply 1 application topically 2 (two) times daily. 05/23/17  Yes Stover, Titorya, DPM  nitroGLYCERIN (NITROSTAT) 0.4 MG SL tablet Place 1 tablet (0.4 mg total) under the tongue every 5 (five) minutes x 3 doses as needed for chest pain. 11/09/16  Yes Creig Hines, NP  pantoprazole (PROTONIX) 40 MG tablet Take 1 tablet (40 mg total) by mouth daily. 11/10/16  Yes Creig Hines, NP  Polyethyl Glycol-Propyl Glycol (SYSTANE OP) Place 1 drop into both eyes daily as needed (dry eyes).   Yes [provider]  polyethylene glycol (MIRALAX / GLYCOLAX) packet Take 17 g by mouth daily as needed (constipation). Mix in 8 oz liquid and drink   Yes [provider]  spironolactone (ALDACTONE) 25 MG tablet Take 25  mg by mouth daily.   Yes [provider]  traMADol (ULTRAM) 50 MG tablet TAKE 1 TABLET BY MOUTH EVERY 6 HOURS AS NEEDED FOR SEVERE PAIN Patient taking differently: 50MG  BY MOUTH EVERY 8 HOURS AS NEEDED FOR PAIN 02/12/17  Yes Rollene Rotunda, MD  aspirin EC 81 MG EC tablet Take 1 tablet (81 mg total) by mouth daily. Patient not taking: Reported on 05/29/2017 08/06/16   Garth Bigness, MD  cephALEXin (KEFLEX) 500 MG capsule Take 1 capsule (500 mg total) by mouth 3 (three) times daily. Patient not taking: Reported on 05/29/2017 12/27/16   Asencion Islam, DPM  metolazone (ZAROXOLYN) 2.5 MG tablet Take Mon, Wed, and Fri 30 mins prior to Lasix or as directed. Patient not taking: Reported on 05/29/2017 11/18/16   Abelino Derrick, PA-C    Family History Family History  Problem Relation Age of Onset  . Chronic Renal Failure Mother   . Diabetes Father   . Heart attack Father 68    Social History Social History   Tobacco Use  . Smoking status: Never Smoker  . Smokeless tobacco: Never Used  Substance Use Topics  . Alcohol use: No  . Drug use: No     Allergies   Levofloxacin; Amlodipine; Gabapentin; Hydrochlorothiazide; Ibuprofen; Metformin  and related; and Simvastatin   Review of Systems Review of Systems  All other systems reviewed and are negative.    Physical Exam Updated Vital Signs BP (!) 122/54   Pulse (!) 42   Resp 15   SpO2 94%   Physical Exam  Constitutional: She is oriented to person, place, and time. She appears well-developed and well-nourished. No distress.  HENT:  Head: Normocephalic and atraumatic.  Eyes: EOM are normal.  Neck: Normal range of motion.  Cardiovascular: Normal rate, regular rhythm and normal heart sounds.  Pulmonary/Chest: Effort normal and breath sounds normal.  Abdominal: Soft. She exhibits no distension. There is no tenderness.  Musculoskeletal: Normal range of motion.  Chronic deformity of right ankle with mild wound of the right great toe with surrounding erythema.  Patient with erythema and warmth of the right lower extremity.  Chronic edema of the left lower extremity with mild erythema but not as much warmth.  Neurological: She is alert and oriented to person, place, and time.  Skin: Skin is warm and dry.  Psychiatric: She has a normal mood and affect. Judgment normal.  Nursing note and vitals reviewed.    ED Treatments / Results  Labs (all labs ordered are listed, but only abnormal results are displayed) Labs Reviewed  CBC WITH DIFFERENTIAL/PLATELET - Abnormal; Notable for the following components:      Result Value   RBC 3.57 (*)    Hemoglobin 9.7 (*)    HCT 30.1 (*)    RDW 17.6 (*)    All other components within normal limits  COMPREHENSIVE METABOLIC PANEL - Abnormal; Notable for the following components:   Sodium 133 (*)    Potassium 6.0 (*)    CO2 13 (*)    Glucose, Bld 282 (*)    BUN 78 (*)    Creatinine, Ser 3.03 (*)    Calcium 8.7 (*)    Total Protein 5.9 (*)    Albumin 2.6 (*)    AST 13 (*)    GFR calc non Af Amer 14 (*)    GFR calc Af Amer 16 (*)    All other components within normal limits   BUN  Date Value Ref Range Status  05/29/2017 78 (H)  6 - 20 mg/dL Final  16/02/9603 34 (H) 6 - 20 mg/dL Final  54/01/8118 32 (H) 8 - 27 mg/dL Final  14/78/2956 31 (H) 8 - 27 mg/dL Final  21/30/8657 27 (H) 6 - 20 mg/dL Final  84/69/6295 26 (H) 6 - 20 mg/dL Final  28/41/3244 43 (H) 8 - 27 mg/dL Final   Creat  Date Value Ref Range Status  08/27/2016 2.03 (H) 0.60 - 0.93 mg/dL Final    Comment:      For patients > or = 76 years of age: The upper reference limit for Creatinine is approximately 13% higher for people identified as African-American.      Creatinine, Ser  Date Value Ref Range Status  05/29/2017 3.03 (H) 0.44 - 1.00 mg/dL Final  05/15/7251 6.64 (H) 0.44 - 1.00 mg/dL Final  40/34/7425 9.56 (H) 0.57 - 1.00 mg/dL Final  38/75/6433 2.95 (H) 0.57 - 1.00 mg/dL Final    EKG  EKG Interpretation None       Radiology No results found.  Procedures Procedures (including critical care time)  Medications Ordered in ED Medications  cefTRIAXone (ROCEPHIN) 1 g in dextrose 5 % 50 mL IVPB (not administered)     Initial Impression / Assessment and Plan / ED Course  I have reviewed the triage vital signs and the nursing notes.  Pertinent labs & imaging results that were available during my care of the patient were reviewed by me and considered in my medical decision making (see chart for details).     Vanco and Rocephin for presumed cellulitis.  Worsening renal function likely secondary to increasing doses of Lasix.  Patient will be admitted the hospital for ongoing management of her lower extremity wounds and cellulitis as well as management of her acute kidney injury.  Her primary care physician is Dr. Jeanie Sewer in [facility]  Final Clinical Impressions(s) / ED Diagnoses   Final diagnoses:  None    ED Discharge Orders    None       Azalia Bilis, MD 05/29/17 1340

## 2017-05-29 NOTE — Progress Notes (Addendum)
Pharmacy Antibiotic Note  Emily Miranda is a 77 y.o. female admitted on 05/29/2017 with cellulitis.  Pharmacy has been consulted for vancomycin dosing. Pt presents to ED with R-leg swelling/pain/redness/weeping, with AKI possibly 2/2 lasix and SCr 3.03 (BL ~ 1.2-1.3).  Afebrile with no leukocytosis at this time.    Plan: Start vancomycin 750mg  IV q 48h, no load Monitor clinical progression, LOT, and renal function   Weight: 145 lb (65.8 kg)  No data recorded.  Recent Labs  Lab 05/29/17 1205  WBC 7.4  CREATININE 3.03*    Estimated Creatinine Clearance: 15 mL/min (A) (by C-G formula based on SCr of 3.03 mg/dL (H)).    Allergies  Allergen Reactions  . Levofloxacin Nausea And Vomiting and Other (See Comments)    Confusion  . Amlodipine Other (See Comments)    Localized edema  . Gabapentin Other (See Comments)    Disoriented, no strength in legs  . Hydrochlorothiazide Other (See Comments)    Hot and disoriented  . Ibuprofen Other (See Comments)    Affected kidneys - stopped flow of urine Reaction to Advil  . Metformin And Related Other (See Comments)    Affected kidneys - stopped flow of urine  . Simvastatin Other (See Comments)    Chest pain    Antimicrobials this admission: Vancomycin 1/17>>  Dose adjustments this admission: n/a   Daylene Posey, PharmD Pharmacy Resident Pager #: [phone] 05/29/2017 1:50 PM

## 2017-05-29 NOTE — Telephone Encounter (Signed)
I informed Oralia Manis of Dr. [name] 05/23/2017 1:31pm orders. Angie states she is not sure what happened concerning this pt, but they would get her taken care of.

## 2017-05-29 NOTE — ED Notes (Signed)
Admitting aware of patient complains of pain

## 2017-05-29 NOTE — Telephone Encounter (Signed)
We can hold off on Home Health until patient is discharged. -Dr. Marylene Land

## 2017-05-29 NOTE — Telephone Encounter (Signed)
Kim with [facility] called wanting to speak to you about [facility]. Requested you call her back as soon as you can at [phone].

## 2017-05-29 NOTE — ED Notes (Signed)
Patient transported to X-ray 

## 2017-05-29 NOTE — Progress Notes (Signed)
CRITICAL VALUE ALERT  Critical Value: troponin 0.04  Date & Time Notified:  05/29/17 2054  Provider Notified: Olen Pel  2059  Orders Received/Actions taken: none

## 2017-05-29 NOTE — Progress Notes (Signed)
Pharmacy Antibiotic Note  Emily Miranda is a 77 y.o. female admitted on 05/29/2017 with cellulitis.  Pt presents to ED with R-leg swelling/pain/redness/weeping, with AKI possibly 2/2 lasix and SCr 3.03 (BL ~ 1.2-1.3).  Afebrile with no leukocytosis at this time. Received vancomycin x 1 and pharmacy has been consulted for ceftriaxone dosing.   Plan: Continue ceftriaxone 1 gram every 24 hours.  Pharmacy will sign off as renal adjustments are not necessary.   Weight: 145 lb (65.8 kg)  No data recorded.  Recent Labs  Lab 05/29/17 1205  WBC 7.4  CREATININE 3.03*    Estimated Creatinine Clearance: 15 mL/min (A) (by C-G formula based on SCr of 3.03 mg/dL (H)).    Allergies  Allergen Reactions  . Levofloxacin Nausea And Vomiting and Other (See Comments)    Confusion  . Amlodipine Other (See Comments)    Localized edema  . Gabapentin Other (See Comments)    Disoriented, no strength in legs  . Hydrochlorothiazide Other (See Comments)    Hot and disoriented  . Ibuprofen Other (See Comments)    Affected kidneys - stopped flow of urine Reaction to Advil  . Metformin And Related Other (See Comments)    Affected kidneys - stopped flow of urine  . Simvastatin Other (See Comments)    Chest pain    Antimicrobials this admission: Vancomycin 1/17>> x1 Ceftriaxone 1/17>>  Dose adjustments this admission: N/A   Thank you for allowing [place] to participate in this patients care.  Verlin Fester, PharmD 05/29/2017 3:45 PM

## 2017-05-29 NOTE — Telephone Encounter (Addendum)
Emily Miranda [facility] states Dr. Jeanie Sewer has called to have pt placed in their care, and she wanted to know what was done for pt on 05/23/2017. I told her I had spoken with [name] and he had accepted pt in to their care to start 05/26/2017. I told Emily Miranda, I had given Dr. [name] recommendation for pt's dtr to take pt to ED at [facility] and she had agreed to do so after the bad weather that weekend.

## 2017-05-29 NOTE — Telephone Encounter (Signed)
I informed Angie - [place], pt is at [facility] ED per pt's str and to hold off on the [facility] orders until pt is out of the hospital. Angie asked if we wanted pt to be followed by them in the ED and I told her I would check with Dr. Marylene Land and inform.

## 2017-05-29 NOTE — H&P (Signed)
History and Physical    Emily Miranda:[id number] DOB: [birth year] DOA: 05/29/2017   PCP: Angelina Sheriff, MD [place]  Patient coming from:  Home    Chief Complaint: legs redness and swelling   HPI: Emily Miranda is a 77 y.o. female  with medical history significant for chronic lymphedema treated by [place], HTN, HLD diabetes, with neuropathy, history of Charcot foot, history of chronic anemia, on Procrit injections, last given 1 week ago, CKD stage III, CAD status post and STEMI on May 2018, status post cardiac catheterization with PCI, brought to the ED due to worsening swelling and redness of her bilateral lower extremities, reporting weeping on both legs.  She also reports worsening pain in the area, and heaviness due to swelling.She denies any calf pain.  She denies any insect bites in the area.  She did have mechanical trauma to the left hip about 1 week ago, falling, without hitting her head.  She denies any chest pain or shortness of breath.  She denies any abdominal pain nausea or vomiting.  She denies any dysuria or hematuria.  Of note, she has recently increase her Lasix at home, to help with the swelling, but which does not seem to help.  She denies any history of clots, or history of cellulitis.  She is essentially nonambulatory, due to pain and swelling.   ED Course:  BP (!) 122/54   Pulse (!) 52   Resp 15   Wt 65.8 kg (145 lb)   SpO2 100%   BMI 23.40 kg/m   She received vancomycin and Rocephin at the ED Hemoglobin 9.7 133, glucose 282, corrected sodium is 136 Potassium is 6 White count 7.4 BUN 78, creatinine 3.03 Albumin 2.6 Calcium 8.7 Weight 145 pounds  Review of Systems:  As per HPI otherwise all other systems reviewed and are negative  Past Medical History:  Diagnosis Date  . Aortic stenosis, mild   . CAD (coronary artery disease)    a. 09/2016 NSTEMI/Cath: RCA 70p/m, RPDA 95ost-->Med Rx;  b. 10/2016 PCI: RCA 70p/m (2.25 x 15 Resolute Onyx DES), RPDA  95ost (2.0 x 12 Resolute Onyx DES).  . Charcot foot due to diabetes mellitus ([place])   . Chronic combined systolic (congestive) and diastolic (congestive) heart failure ([place])    a. 07/2016 Echo: EF 40-45%, Gr2 DD, mild AS, triv AI, mild MR, mod dil LA.  . Diabetes mellitus   . HTN (hypertension)   . Hyperlipidemia   . Myocardial infarction ([place])   . Neuropathy   . Pancreatitis   . Physical deconditioning    a. W/C bound.    Past Surgical History:  Procedure Laterality Date  . ABDOMINAL HYSTERECTOMY    . APPENDECTOMY    . CORONARY STENT INTERVENTION N/A 11/05/2016   Procedure: Coronary Stent Intervention;  Surgeon: Troy Sine, MD;  Location: [facility];  Service: Cardiovascular;  Laterality: N/A;  . FOOT SURGERY    . LAPAROSCOPIC INCISIONAL / UMBILICAL / VENTRAL HERNIA REPAIR  01/04/2008   Dr Ronnald Collum  . LAPAROSCOPIC LYSIS OF ADHESIONS  2007   Dr Harle Battiest  . LEFT HEART CATH AND CORONARY ANGIOGRAPHY N/A 10/11/2016   Procedure: Left Heart Cath and Coronary Angiography;  Surgeon: Nelva Bush, MD;  Location: [facility];  Service: Cardiovascular;  Laterality: N/A;  . OVARIAN CYST REMOVAL    . ROBOT ASSISTED PYELOPLASTY  02/2007   Dr Alinda Money  . SUTURE REMOVAL  08/17/2009   Dr  [employer] .  Right paramedian GoreTex stitch  . TONSILLECTOMY      Social History Social History   Socioeconomic History  . Marital status: Married    Spouse name: Not on file  . Number of children: Not on file  . Years of education: Not on file  . Highest education level: Not on file  Social Needs  . Financial resource strain: Not on file  . Food insecurity - worry: Not on file  . Food insecurity - inability: Not on file  . Transportation needs - medical: Not on file  . Transportation needs - non-medical: Not on file  Occupational History  . Not on file  Tobacco Use  . Smoking status: Never Smoker  . Smokeless tobacco: Never Used  Substance and Sexual Activity  . Alcohol  use: No  . Drug use: No  . Sexual activity: No  Other Topics Concern  . Not on file  Social History Narrative  . Not on file     Allergies  Allergen Reactions  . Levofloxacin Nausea And Vomiting and Other (See Comments)    Confusion  . Amlodipine Other (See Comments)    Localized edema  . Gabapentin Other (See Comments)    Disoriented, no strength in legs  . Hydrochlorothiazide Other (See Comments)    Hot and disoriented  . Ibuprofen Other (See Comments)    Affected kidneys - stopped flow of urine Reaction to Advil  . Metformin And Related Other (See Comments)    Affected kidneys - stopped flow of urine  . Simvastatin Other (See Comments)    Chest pain    Family History  Problem Relation Age of Onset  . Chronic Renal Failure Mother   . Diabetes Father   . Heart attack Father 35      Prior to Admission medications   Medication Sig Start Date End Date Taking? Authorizing Provider  Ascorbic Acid (VITAMIN C) 1000 MG tablet Take 1,000 mg by mouth daily.   Yes [provider]  aspirin-acetaminophen-caffeine (EXCEDRIN MIGRAINE) [phone] MG tablet Take 1 tablet by mouth every 6 (six) hours as needed for headache.   Yes [provider]  atorvastatin (LIPITOR) 40 MG tablet Take 1 tablet (40 mg total) by mouth daily at 6 PM. 08/05/16  Yes Sela Hilding, MD  carvedilol (COREG) 6.25 MG tablet Take 6.25 mg by mouth 2 (two) times daily with a meal.   Yes [provider]  clopidogrel (PLAVIX) 75 MG tablet Take 1 tablet (75 mg total) by mouth daily with breakfast. 11/10/16  Yes Theora Gianotti, NP  cyclobenzaprine (FLEXERIL) 5 MG tablet Take 10 mg by mouth 3 (three) times daily.  10/16/16  Yes [provider]  fexofenadine (ALLEGRA) 180 MG tablet Take 180 mg by mouth daily.   Yes [provider]  fluticasone (FLONASE) 50 MCG/ACT nasal spray Place 1 spray into both nostrils daily as needed (seasonal allergies).  02/05/16  Yes  [provider]  furosemide (LASIX) 40 MG tablet Take 20 mg by mouth daily.    Yes [provider]  glimepiride (AMARYL) 2 MG tablet Take 4 mg by mouth daily with breakfast.    Yes [provider]  glimepiride (AMARYL) 4 MG tablet Take 2-4 mg by mouth See admin instructions. '4mg'$  in am, '2mg'$  in pm 05/17/17  Yes [provider]  hydrALAZINE (APRESOLINE) 25 MG tablet Take 25 mg by mouth every 8 (eight) hours.   Yes [provider]  isosorbide mononitrate (  IMDUR) 30 MG 24 hr tablet Take 1 tablet (30 mg total) by mouth daily. 11/09/16  Yes Theora Gianotti, NP  LANTUS SOLOSTAR 100 UNIT/ML Solostar Pen Inject 30 Units into the skin at bedtime. 05/18/17  Yes [provider]  linagliptin (TRADJENTA) 5 MG TABS tablet Take 5 mg by mouth daily.   Yes [provider]  lisinopril (PRINIVIL,ZESTRIL) 30 MG tablet Take 30 mg by mouth daily.   Yes [provider]  mupirocin cream (BACTROBAN) 2 % Apply 1 application topically 2 (two) times daily. 05/23/17  Yes Stover, Titorya, DPM  nitroGLYCERIN (NITROSTAT) 0.4 MG SL tablet Place 1 tablet (0.4 mg total) under the tongue every 5 (five) minutes x 3 doses as needed for chest pain. 11/09/16  Yes Theora Gianotti, NP  pantoprazole (PROTONIX) 40 MG tablet Take 1 tablet (40 mg total) by mouth daily. 11/10/16  Yes Theora Gianotti, NP  Polyethyl Glycol-Propyl Glycol (SYSTANE OP) Place 1 drop into both eyes daily as needed (dry eyes).   Yes [provider]  polyethylene glycol (MIRALAX / GLYCOLAX) packet Take 17 g by mouth daily as needed (constipation). Mix in 8 oz liquid and drink   Yes [provider]  spironolactone (ALDACTONE) 25 MG tablet Take 25 mg by mouth daily.   Yes [provider]  traMADol (ULTRAM) 50 MG tablet TAKE 1 TABLET BY MOUTH EVERY 6 HOURS AS NEEDED FOR SEVERE PAIN Patient taking differently: '50MG'$  BY MOUTH EVERY 8 HOURS AS NEEDED FOR PAIN  02/12/17  Yes Minus Breeding, MD  aspirin EC 81 MG EC tablet Take 1 tablet (81 mg total) by mouth daily. Patient not taking: Reported on 05/29/2017 08/06/16   Sela Hilding, MD  cephALEXin (KEFLEX) 500 MG capsule Take 1 capsule (500 mg total) by mouth 3 (three) times daily. Patient not taking: Reported on 05/29/2017 12/27/16   Landis Martins, DPM  metolazone (ZAROXOLYN) 2.5 MG tablet Take Mon, Wed, and Fri 30 mins prior to Lasix or as directed. Patient not taking: Reported on 05/29/2017 11/18/16   Erlene Quan, PA-C    Physical Exam:  Vitals:   05/29/17 1200 05/29/17 1340 05/29/17 1358 05/29/17 1402  BP: (!) 122/54     Pulse: (!) 42  (!) 48 (!) 52  Resp: 15     SpO2: 94%  100% 100%  Weight:  65.8 kg (145 lb)     Constitutional: NAD, but in significant amount of pain in the left hip, and complaining of heaviness in her lower extremities due to swelling.   Eyes: PERRL, lids and conjunctivae normal ENMT: Mucous membranes are moist, without exudate or lesions  Neck: normal, supple, no masses, no thyromegaly Respiratory: clear to auscultation bilaterally, no wheezing, no crackles. Normal respiratory effort  Cardiovascular: Regular rate and rhythm, 2 out of 6 murmur, rubs or gallops.  2-3+ lower extremity edema, with some areas of weeping.. 2+ pedal pulses. No carotid bruits.  Abdomen: Soft, non tender, No hepatosplenomegaly. Bowel sounds positive.  Musculoskeletal: no clubbing / cyanosis. Moves all extremities. chronic deformity of the right ankle, in addition, the patient has what appears to be stasis dermatitis Skin with mild wound in the right great toe, surrounding mild erythema in the right lower extremity shows mild erythema as well There is chronic edema on the left lower extremity with mild erythema, extremities warm. Neurologic: Sensation intact  Strength equal in all extremities, except that the right upper leg strength has not been tested, due to significant pain Psychiatric:  Alert and oriented x 3.  Tearful due to pain   Labs on Admission: I have personally reviewed following labs and imaging studies  CBC: Recent Labs  Lab 05/29/17 1205  WBC 7.4  NEUTROABS 5.2  HGB 9.7*  HCT 30.1*  MCV 84.3  PLT 382    Basic Metabolic Panel: Recent Labs  Lab 05/29/17 1205  NA 133*  K 6.0*  CL 110  CO2 13*  GLUCOSE 282*  BUN 78*  CREATININE 3.03*  CALCIUM 8.7*    GFR: Estimated Creatinine Clearance: 15 mL/min (A) (by C-G formula based on SCr of 3.03 mg/dL (H)).  Liver Function Tests: Recent Labs  Lab 05/29/17 1205  AST 13*  ALT 15  ALKPHOS 107  BILITOT 0.4  PROT 5.9*  ALBUMIN 2.6*   No results for input(s): LIPASE, AMYLASE in the last 168 hours. No results for input(s): AMMONIA in the last 168 hours.  Coagulation Profile: No results for input(s): INR, PROTIME in the last 168 hours.  Cardiac Enzymes: No results for input(s): CKTOTAL, CKMB, CKMBINDEX, TROPONINI in the last 168 hours.  BNP (last 3 results) No results for input(s): PROBNP in the last 8760 hours.  HbA1C: No results for input(s): HGBA1C in the last 72 hours.  CBG: No results for input(s): GLUCAP in the last 168 hours.  Lipid Profile: No results for input(s): CHOL, HDL, LDLCALC, TRIG, CHOLHDL, LDLDIRECT in the last 72 hours.  Thyroid Function Tests: No results for input(s): TSH, T4TOTAL, FREET4, T3FREE, THYROIDAB in the last 72 hours.  Anemia Panel: No results for input(s): VITAMINB12, FOLATE, FERRITIN, TIBC, IRON, RETICCTPCT in the last 72 hours.  Urine analysis:    Component Value Date/Time   COLORURINE YELLOW 07/29/2016 1247   APPEARANCEUR HAZY (A) 07/29/2016 1247   LABSPEC 1.016 07/29/2016 1247   PHURINE 5.0 07/29/2016 1247   GLUCOSEU >=500 (A) 07/29/2016 1247   HGBUR NEGATIVE 07/29/2016 1247   BILIRUBINUR NEGATIVE 07/29/2016 1247   KETONESUR NEGATIVE 07/29/2016 1247   PROTEINUR >=300 (A) 07/29/2016 1247   UROBILINOGEN 0.2 03/03/2007 1337   NITRITE NEGATIVE  07/29/2016 1247   LEUKOCYTESUR NEGATIVE 07/29/2016 1247    Sepsis Labs: '@LABRCNTIP'$ (procalcitonin:4,lacticidven:4) )No results found for this or any previous visit (from the past 240 hour(s)).   Radiological Exams on Admission: No results found.  EKG: Independently reviewed.  Assessment/Plan Principal Problem:   Cellulitis of lower extremity Active Problems:   Edema   Hypertensive cardiomyopathy, with heart failure (HCC)   Constipation, chronic   SUI (stress urinary incontinence, female)   CKD (chronic kidney disease), stage III (HCC)   Chronic diabetic ulcer of right foot    PVD (peripheral vascular disease) (HCC)   Charcot foot due to diabetes mellitus (HCC)   Chronic anemia   Chronic combined systolic and diastolic heart failure (HCC)        Bilateral lower extremity erythema, likely consistent with stasis dermatitis, with possibly superimposed  mildly afebrile, vital signs stable.  Afebrile white count normal.  Initial lactic acid pending.  Received 1 dose of vancomycin and Rocephin. Admit to telemetry inpatient Will continue cephalosporin for the treatment of possible early cellulitis. Blood cultures Check ESR, serum lactate, CRP  IVF 500 cc bolus NS and then at  75 cc/f for now  PT/OT when able to ambulate SCDS /compression stockings  Left hip pain after mechanical fall, no fracture or malalignmennt per R hip XR, but moderate left hip osteoarthritis  PT OT consult Pain control with Vicodin and morphine, If pain continues  despite good pain regimen, then will entertain obtaining MRI of the hip to evaluate for other abnormalities versus occult fracture.   Chronic combined systolic and diastolic CHF: No acute decompensation weight  145 lbs . Last 2D echo EF 40-45%, with moderately reduced systolic function, grade 2 diastolic  Lasix on hold due to Acute on chronic renal failure  Continue meds with Coreg obtain daily weights Monitor intake and output    Acute on  Chronic CKD: likely due to  ACE I   diuretics and NSAIDS.  baseline creatinine  1.2-1.4   Current Cr is 3.03  Lab Results  Component Value Date   CREATININE 3.03 (H) 05/29/2017   CREATININE 1.47 (H) 11/25/2016   CREATININE 1.22 (H) 11/25/2016  Hold diuretics and ACE I, and NSAIDS IVF  Follow BMP   Acute hyperkalemia, likely due to ACEI use, aldactone  EKG pending, current values 6  Recheck BMET  Repeat EKG as indicated Check Troponin Telemetry IVF  Hold ACE I and Aldactone    Anemia of chronic disease Hemoglobin on admission 9.7, at baseline.  Patient is being seen by Dr. Lavera Guise receiving Procrit, last given 1 week ago. Repeat CBC in am  No transfusion is indicated at this time  Type II Diabetes Current blood sugar level is 282  Lab Results  Component Value Date   HGBA1C 11.3 (H) 10/06/2016  Hgb A1C Hold home oral diabetic medications.  Lantus 30 units  , SSI    DVT prophylaxis: SCDs Code Status:    Full Family Communication:  Discussed with patient Disposition Plan: Expect patient to be discharged to home after condition improves Consults called:    None Admission status: Inpatient telemetry   Sharene Butters, PA-C [facility]   Amion text  [phone]   05/29/2017, 2:16 PM

## 2017-05-29 NOTE — Telephone Encounter (Signed)
[facility] states she can take verbal orders for wound care to pt's legs, and have a nurse go see pt to access and treat.

## 2017-05-29 NOTE — ED Notes (Signed)
Admitting at bedside,  

## 2017-05-29 NOTE — ED Notes (Signed)
Dinner tray ordered for pt

## 2017-05-29 NOTE — ED Triage Notes (Signed)
Pt arrives from home from bilateral lower leg swelling and weeping, worse on right. Pt denies any sob or cp at this time. Pt did recently have heart attack.

## 2017-05-29 NOTE — Telephone Encounter (Signed)
Peggye Pitt, sister states she was speaking to pt's dtr, Zella Ball and pt is going to the [facility] today, and should be there now. I told Natalia Leatherwood that I would send Dr. Marylene Land a message as soon as I finished speaking with her and I thanked her for letting Dr. Marylene Land know of pt's status.

## 2017-05-29 NOTE — Telephone Encounter (Signed)
Esau Grew states on 04/30/2017 he sent message informing our office that [facility] was not able to take pt due to they did not have a nurse and there had to be other skilled nursing orders, weeping, wounds for lymphedema to be treated by [facility]. I told Kenard Gower - [facility] I refaxed the clinicals and referral on 05/02/2017. Kenard Gower states on 05/23/2017 he did refer pt to [facility] and will check on status of referral and call again.

## 2017-05-30 ENCOUNTER — Other Ambulatory Visit: Payer: Self-pay

## 2017-05-30 ENCOUNTER — Encounter (HOSPITAL_COMMUNITY): Payer: Self-pay | Admitting: General Practice

## 2017-05-30 ENCOUNTER — Inpatient Hospital Stay (HOSPITAL_COMMUNITY): Payer: Medicare Other

## 2017-05-30 DIAGNOSIS — K5909 Other constipation: Secondary | ICD-10-CM

## 2017-05-30 DIAGNOSIS — I43 Cardiomyopathy in diseases classified elsewhere: Secondary | ICD-10-CM

## 2017-05-30 DIAGNOSIS — R609 Edema, unspecified: Secondary | ICD-10-CM

## 2017-05-30 DIAGNOSIS — I110 Hypertensive heart disease with heart failure: Secondary | ICD-10-CM

## 2017-05-30 DIAGNOSIS — I872 Venous insufficiency (chronic) (peripheral): Secondary | ICD-10-CM

## 2017-05-30 DIAGNOSIS — N179 Acute kidney failure, unspecified: Principal | ICD-10-CM

## 2017-05-30 DIAGNOSIS — I739 Peripheral vascular disease, unspecified: Secondary | ICD-10-CM

## 2017-05-30 LAB — CBC
HCT: 28 % — ABNORMAL LOW (ref 36.0–46.0)
Hemoglobin: 9 g/dL — ABNORMAL LOW (ref 12.0–15.0)
MCH: 27.4 pg (ref 26.0–34.0)
MCHC: 32.1 g/dL (ref 30.0–36.0)
MCV: 85.4 fL (ref 78.0–100.0)
Platelets: 229 10*3/uL (ref 150–400)
RBC: 3.28 MIL/uL — ABNORMAL LOW (ref 3.87–5.11)
RDW: 18.2 % — ABNORMAL HIGH (ref 11.5–15.5)
WBC: 7.3 10*3/uL (ref 4.0–10.5)

## 2017-05-30 LAB — BASIC METABOLIC PANEL
Anion gap: 5 (ref 5–15)
BUN: 67 mg/dL — ABNORMAL HIGH (ref 6–20)
CO2: 15 mmol/L — ABNORMAL LOW (ref 22–32)
Calcium: 8.2 mg/dL — ABNORMAL LOW (ref 8.9–10.3)
Chloride: 115 mmol/L — ABNORMAL HIGH (ref 101–111)
Creatinine, Ser: 2.39 mg/dL — ABNORMAL HIGH (ref 0.44–1.00)
GFR calc Af Amer: 22 mL/min — ABNORMAL LOW (ref >60)
GFR calc non Af Amer: 19 mL/min — ABNORMAL LOW (ref >60)
Glucose, Bld: 194 mg/dL — ABNORMAL HIGH (ref 65–99)
Potassium: 5.9 mmol/L — ABNORMAL HIGH (ref 3.5–5.1)
Sodium: 135 mmol/L (ref 135–145)

## 2017-05-30 LAB — GLUCOSE, CAPILLARY
Glucose-Capillary: 167 mg/dL — ABNORMAL HIGH (ref 65–99)
Glucose-Capillary: 143 mg/dL — ABNORMAL HIGH (ref 65–99)
Glucose-Capillary: 186 mg/dL — ABNORMAL HIGH (ref 65–99)
Glucose-Capillary: 208 mg/dL — ABNORMAL HIGH (ref 65–99)

## 2017-05-30 LAB — MAGNESIUM: Magnesium: 1.9 mg/dL (ref 1.7–2.4)

## 2017-05-30 LAB — PHOSPHORUS: Phosphorus: 3.5 mg/dL (ref 2.5–4.6)

## 2017-05-30 MED ORDER — SODIUM POLYSTYRENE SULFONATE PO POWD
15.0000 g | ORAL | Status: AC
  Administered 2017-05-30: 15 g via ORAL
  Filled 2017-05-30: qty 15

## 2017-05-30 MED ORDER — INSULIN GLARGINE 100 UNIT/ML ~~LOC~~ SOLN
15.0000 [IU] | Freq: Every day | SUBCUTANEOUS | Status: DC
  Administered 2017-05-30 – 2017-05-31 (×2): 15 [IU] via SUBCUTANEOUS
  Filled 2017-05-30 (×3): qty 0.15

## 2017-05-30 MED ORDER — SILVER SULFADIAZINE 1 % EX CREA
TOPICAL_CREAM | Freq: Every day | CUTANEOUS | Status: DC
  Administered 2017-05-30 – 2017-06-03 (×5): via TOPICAL
  Filled 2017-05-30: qty 85

## 2017-05-30 MED ORDER — COLLAGENASE 250 UNIT/GM EX OINT
TOPICAL_OINTMENT | Freq: Every day | CUTANEOUS | Status: DC
  Administered 2017-05-30 – 2017-06-03 (×5): via TOPICAL
  Filled 2017-05-30: qty 30

## 2017-05-30 NOTE — Consult Note (Signed)
WOC Nurse wound consult note Reason for Consult: legs Patient with multiple ulcerations on the bilateral extremities and foot/leg deformity on the RLE. Skin changes consistent with venous stasis and lymphedema When I discussed this with the patient she reports she can not tolerate compression, even when we discuss any types of long term compression she reports she "can not stand any of them".   Wound type: Partial thickness ulcerations scattered over the RLE posterior calf. One area noted 3cm x 2cm x 0.1cm lateral pretibial region; yellow with serous drainage. Weeping  Partial thickness ulceration distal medial foot: 1cm x 1cm x 0.1cm; dry, fibrinous, non healing; no drainage noted at the time of my assessment  Partial thickness ulceration plantar surface; 1.5cm x 2.5cm x 0.1cm with yellow thick drainage, no odor, but no obvious surrounding erythema.  May consider plain films with the presence of this ulceration with pocket of drainage.  MD at bedside to evaluate as well.  Partial thickness ulceration left pretibial: 2cm x 1cm x 0.1cm; 100% yellow but dry with hyperkeratosis of wound edges; no drainage at the time of my assessment  Pressure Injury POA: NA Measurement: see above  Wound bed:see above Drainage (amount, consistency, odor) see above Periwound: venous skin changes, brawny skin changes on the RLE, redness that is normal for patient per her own report  Dressing procedure/placement/frequency: Silvadene to the RLE, except for the plantar surface.  Enzymatic debridement ointment to the plantar surface wound daily. Wrap with kerlix only. Antibioitic ointment to the LLE.  Hospitalist at bedside  Follow up with podiatrist at DC   Discussed POC with patient and bedside nurse.  Re consult if needed, will not follow at this time. Thanks  Tyon Cerasoli [employer], RN,CWOCN, CNS, CWON-AP [phone])

## 2017-05-30 NOTE — Progress Notes (Signed)
Dr. Melynda Ripple called re: EKG- not being able to see it uploaded in computer; results read to Dr.;checked with CN RN Alcario Drought- and the EKG machine on this unit will not upload and [employer] has been notified.

## 2017-05-30 NOTE — Progress Notes (Signed)
[facility] Note  Dr. Ophelia Charter asked for f/u of BMP and EKG.  BMP shows k improving. EKG not seen in EMR. Contacted night nurse who will have it re-loaded. Add Mg & Phos for AM. As nurse reported some PVCs.  Haydee Salter, MD

## 2017-05-30 NOTE — Progress Notes (Signed)
PROGRESS NOTE    Emily Miranda  ZOX:[id number] DOB: [birth year] DOA: 05/29/2017 PCP: Noni Saupe, MD    Brief Narrative:  77 year old female who presented with lower extremity erythema and edema. Patient does have significant past medical history for chronic lymphedema, hypertension, dyslipidemia, type 2 diabetes mellitus, neuropathy, Charcot foot, chronic anemia, chronic kidney disease stage III, and coronary artery disease. Patient complaint of worsening edema in both lower extremities, associated with pain and heaviness. On initial physical examination blood pressure 06/01/2052, heart rate 52, respiratory 15, oxygen 100%. Was mucous membranes, lungs clear to auscultation bilaterally, heart S1-S2 present and rhythmic, abdomen was soft nontender, positive lower extremity edema to 3+, positive areas of clear transudate with multiple ulcers. Sodium 134, potassium 5.4, chloride 110, bicarbonate 15, glucose 227, BUN 75, creatinine 2.50, white count 7.3, he will 9.0, hematocrit 20 positive, platelets 229. EKG: Normal sinus rhythm, positive PACs, 72 bpm, normal axis, normal intervals.  Patient was admitted to the hospital working diagnosis of stasis dermatitis rule out cellulitis, complicated with acute kidney injury chronic kidney disease with hyperkalemia.    Assessment & Plan:   Principal Problem:   Stasis dermatitis of both legs Active Problems:   Hypertensive cardiomyopathy, with heart failure (HCC)   Constipation, chronic   SUI (stress urinary incontinence, female)   CKD (chronic kidney disease), stage III (HCC)   Chronic diabetic ulcer of right foot    PVD (peripheral vascular disease) (HCC)   Charcot foot due to diabetes mellitus (HCC)   Chronic anemia   Chronic combined systolic and diastolic heart failure (HCC)   Edema   Cellulitis of lower extremity   Acute renal failure (ARF) (HCC)   1. AKI on CKD stage 3 with hyperkalemia. Will continue close monitoring of renal  function and electrolytes, will increase rate of fluid to 100 ml per hour. Will add kayexalate x2 and will follow renal panel in am.   2. Stasis dermatitis. No signs of local deep infection, will hold on systemic antibiotics, continue with local wound care, case discussed with wound nurse.   3. Hypertension. Blood pressure control. Continue coreg, hydralazine and isosorbide.   4. Type 2 diabetes mellitus. Will continue glucose cover and monitoring with insulin sliding scale, capillary glucose 228, 185, 167, 143, 186. Basal insulin 15 units at bedtime/.   DVT prophylaxis: heparin   Code Status:  full Family Communication: no family at the bedside Disposition Plan: home   Consultants:     Procedures:     Antimicrobials:       Subjective: Patient with left hip pain, worse with movement, associated with weakness and decreased mobility. No nausea or vomiting, no diarrhea.   Objective: Vitals:   05/29/17 1833 05/29/17 1900 05/29/17 1940 05/30/17 0602  BP: 110/68  129/60 (!) 148/58  Pulse: 78  77 78  Resp: 16  18 18   Temp: 98 F (36.7 C)  (!) 97.2 F (36.2 C) 98 F (36.7 C)  TempSrc: Oral Oral Oral Oral  SpO2: 98%  98% 98%  Weight: 58.1 kg (128 lb 1.4 oz)   57.9 kg (127 lb 10.3 oz)  Height: 5\' 6"  (1.676 m)       Intake/Output Summary (Last 24 hours) at 05/30/2017 1037 Last data filed at 05/30/2017 0500 Gross per 24 hour  Intake 366.25 ml  Output 800 ml  Net -433.75 ml   Filed Weights   05/29/17 1340 05/29/17 1833 05/30/17 0602  Weight: 65.8 kg (145 lb) 58.1 kg (128 lb  1.4 oz) 57.9 kg (127 lb 10.3 oz)    Examination:   General: Not in pain or dyspnea, deconditioned Neurology: Awake and alert, non focal  E ENT: positive pallor, no icterus, oral mucosa moist Cardiovascular: No JVD. S1-S2 present, rhythmic, no gallops, rubs, or murmurs. Right  lower extremity edema, non pitting. Pulmonary: decreased breath sounds bilaterally at bases, adequate air movement, no  wheezing, rhonchi or rales. Gastrointestinal. Abdomen flat, no organomegaly, non tender, no rebound or guarding Skin. Erythema at both legs anterior with ill defined ulcers, with ill defined borders Musculoskeletal: significant deformities on both feet more right than left.         Data Reviewed: I have personally reviewed following labs and imaging studies  CBC: Recent Labs  Lab 05/29/17 1205 05/30/17 0638  WBC 7.4 7.3  NEUTROABS 5.2  --   HGB 9.7* 9.0*  HCT 30.1* 28.0*  MCV 84.3 85.4  PLT 238 229   Basic Metabolic Panel: Recent Labs  Lab 05/29/17 1205 05/29/17 1835 05/30/17 0638  NA 133* 134* 135  K 6.0* 5.4* 5.9*  CL 110 110 115*  CO2 13* 15* 15*  GLUCOSE 282* 227* 194*  BUN 78* 75* 67*  CREATININE 3.03* 2.50* 2.39*  CALCIUM 8.7* 8.6* 8.2*  MG  --   --  1.9  PHOS  --   --  3.5   GFR: Estimated Creatinine Clearance: 18.6 mL/min (A) (by C-G formula based on SCr of 2.39 mg/dL (H)). Liver Function Tests: Recent Labs  Lab 05/29/17 1205  AST 13*  ALT 15  ALKPHOS 107  BILITOT 0.4  PROT 5.9*  ALBUMIN 2.6*   No results for input(s): LIPASE, AMYLASE in the last 168 hours. No results for input(s): AMMONIA in the last 168 hours. Coagulation Profile: No results for input(s): INR, PROTIME in the last 168 hours. Cardiac Enzymes: Recent Labs  Lab 05/29/17 1835  TROPONINI 0.04*   BNP (last 3 results) No results for input(s): PROBNP in the last 8760 hours. HbA1C: Recent Labs    05/29/17 1446  HGBA1C 11.8*   CBG: Recent Labs  Lab 05/29/17 1738 05/29/17 2110 05/30/17 0711  GLUCAP 228* 185* 167*   Lipid Profile: No results for input(s): CHOL, HDL, LDLCALC, TRIG, CHOLHDL, LDLDIRECT in the last 72 hours. Thyroid Function Tests: No results for input(s): TSH, T4TOTAL, FREET4, T3FREE, THYROIDAB in the last 72 hours. Anemia Panel: No results for input(s): VITAMINB12, FOLATE, FERRITIN, TIBC, IRON, RETICCTPCT in the last 72 hours.    Radiology  Studies: I have reviewed all of the imaging during this hospital visit personally     Scheduled Meds: . atorvastatin  40 mg Oral q1800  . carvedilol  6.25 mg Oral BID WC  . clopidogrel  75 mg Oral Q breakfast  . cyclobenzaprine  10 mg Oral TID  . hydrALAZINE  25 mg Oral Q8H  . insulin aspart  0-9 Units Subcutaneous TID WC  . insulin glargine  30 Units Subcutaneous QHS  . isosorbide mononitrate  30 mg Oral Daily  . mupirocin cream  1 application Topical BID  . pantoprazole  40 mg Oral Daily   Continuous Infusions: . sodium chloride 75 mL/hr at 05/30/17 0052  . cefTRIAXone (ROCEPHIN)  IV       LOS: 1 day        Coralie Keens, MD [facility] Pager [phone]

## 2017-05-30 NOTE — Plan of Care (Signed)
  Education: Knowledge of General Education information will improve 05/30/2017 0314 - Progressing by Olena Mater, RN Note POC reviewed with pt. and daughter.

## 2017-05-30 NOTE — Progress Notes (Signed)
Bilateral lower extremity venous duplex completed. No evidence of DVT, suprfical thrombosis, or Baker's cyst. Toma Deiters, RVS 05/30/2017 3:30 PM

## 2017-05-30 NOTE — Progress Notes (Signed)
Occupational Therapy Evaluation  PTA, pt lived at home with her husband and had limited mobility @ RW level due to pain. Husband assisted with ADL tasks as needed, but pt states she only needed set up. Evaluation limited by pain. Pt motivated to increase level of independence and mobility.   05/30/17 1420  OT Visit Information  Last OT Received On 05/30/17  Assistance Needed +2  History of Present Illness Pt is a 77 y.o. female admitted 05/29/17 with BLE edema, redness, and multiple weeping ulcers; worked up for stasis dermatitis with possible overlying cellulitis. Also c/o worsening L hip pain; x-ray clear, but no MRI yet. PMH includes RLE lymphedema with Charcot foot, CAD, HLD, DM, HF, acute on chronic CKD.  Precautions  Precautions Fall  Precaution Comments BLE weeping ulcers; R charcot foot deformity w/ lateral deviation  Restrictions  Weight Bearing Restrictions No  Home Living  Family/patient expects to be discharged to: Skilled nursing facility  Living Arrangements Spouse/significant other;Children  Additional Comments Pt live at home with spouse and son  Prior Function  Level of Independence Needs assistance  Gait / Transfers Assistance Needed 1-2 wks PTA, pt reliant on husband to assist for all ambulation, primarily using w/c (does not fit in bathroom, so son installed grab bars). Prior to this, pt has used rollator past 3-4 months  ADL's / Homemaking Assistance Needed Husband assists with all ADLs. Pt does bird baths sitting on toilet; reliant on husband for toilet transfers  Communication  Communication No difficulties  Pain Assessment  Pain Assessment Faces  Faces Pain Scale 8  Pain Location BLE with movement (L>R)  Pain Descriptors / Indicators Aching;Shooting;Grimacing;Moaning  Pain Intervention(s) Limited activity within patient's tolerance  Cognition  Arousal/Alertness Awake/alert  Behavior During Therapy WFL for tasks assessed/performed  Overall Cognitive Status  Within Functional Limits for tasks assessed  Upper Extremity Assessment  Upper Extremity Assessment Overall WFL for tasks assessed  Lower Extremity Assessment  Lower Extremity Assessment Defer to PT evaluation  RLE Deficits / Details h/o Charcot foot deformity with severe lateral deviation; BLE weeping wounds  LLE Deficits / Details Unable to lift LLE secondary to L hip pain; BLE weeping wounds  LLE Unable to fully assess due to pain  ADL  Overall ADL's  Needs assistance/impaired  Grooming Set up;Bed level  Upper Body Bathing Set up;Bed level  Lower Body Bathing Maximal assistance;Bed level  Upper Body Dressing  Minimal assistance;Bed level  Lower Body Dressing Maximal assistance;Bed level  General ADL Comments Evalation limited to bed level. Pt staes at baseline she pulls up suing grab bar in bathroom and is able to bath herself after her husband sets her up; Pt states she ambulates to her bathroom from her bed but that she has been unable to go further due to B leg pain  Bed Mobility  Overal bed mobility Needs Assistance  Bed Mobility Rolling  Rolling Min assist  General bed mobility comments Limited to bed level eval secondary to pain. Able to roll R/L with use of hand rails and minA due to pain.  Transfers  General transfer comment unableto attempt at this time (May do well with [facility])  Balance  Overall balance assessment Needs assistance  OT - End of Session  Activity Tolerance Patient limited by pain  Patient left in bed;with call bell/phone within reach (leaving for procedure)  Nurse Communication Mobility status;Need for lift equipment (Attempt [place])  OT Assessment  OT Recommendation/Assessment Patient needs continued OT Services  OT  Visit Diagnosis Unsteadiness on feet (R26.81);Muscle weakness (generalized) (M62.81);Pain  Pain - Right/Left Right (L)  Pain - part of body Leg;Hip  OT Problem List Decreased strength;Decreased range of motion;Decreased activity  tolerance;Impaired balance (sitting and/or standing);Decreased safety awareness;Decreased knowledge of use of DME or AE;Pain  OT Plan  OT Frequency (ACUTE ONLY) Min 2X/week  OT Treatment/Interventions (ACUTE ONLY) Self-care/ADL training;Therapeutic exercise;DME and/or AE instruction;Therapeutic activities;Patient/family education;Balance training  AM-PAC OT "6 Clicks" Daily Activity Outcome Measure  Help from another person eating meals? 4  Help from another person taking care of personal grooming? 4  Help from another person toileting, which includes using toliet, bedpan, or urinal? 2  Help from another person bathing (including washing, rinsing, drying)? 2  Help from another person to put on and taking off regular upper body clothing? 3  Help from another person to put on and taking off regular lower body clothing? 2  6 Click Score 17  ADL G Code Conversion CK  OT Recommendation  Follow Up Recommendations SNF;Supervision/Assistance - 24 hour  OT Equipment Other (comment) (TBA at SNF)  Individuals Consulted  Consulted and Agree with Results and Recommendations Patient  Acute Rehab OT Goals  Patient Stated Goal Decreased pain, then get stronger at rehab  OT Goal Formulation With patient  Time For Goal Achievement 06/13/17  Potential to Achieve Goals Fair  OT Time Calculation  OT Start Time (ACUTE ONLY) 1410  OT Stop Time (ACUTE ONLY) 1424  OT Time Calculation (min) 14 min  OT General Charges  $OT Visit 1 Visit  OT Evaluation  $OT Eval Moderate Complexity 1 Mod  Written Expression  Dominant Hand Right  [facility], OT/L  [phone] 05/30/2017

## 2017-05-30 NOTE — Evaluation (Signed)
Physical Therapy Evaluation Patient Details Name: Emily Miranda MRN: [id number] DOB: 12-Aug-1940 Today's Date: 05/30/2017   History of Present Illness  Pt is a 77 y.o. female admitted 05/29/17 with BLE edema, redness, and multiple weeping ulcers; worked up for stasis dermatitis with possible overlying cellulitis. Also c/o worsening L hip pain; x-ray clear, but no MRI yet. PMH includes RLE lymphedema with Charcot foot, CAD, HLD, DM, HF, acute on chronic CKD.    Clinical Impression  Pt presents with an overall decrease in functional mobility secondary to above. PTA, pt reliant on assist from husband for all transfers/ADLs the past few weeks; before this, able to amb with rollator. Today's evaluation limited to bed level mobility secondary to pt's significant L hip pain. Recommend SNF-level therapies at d/c to maximize functional mobility and decrease caregiver burden prior to returning home; pt in agreement with this. Will continue to follow acutely to address established goals.    Follow Up Recommendations SNF    Equipment Recommendations  (TBD next venue)    Recommendations for Other Services OT consult     Precautions / Restrictions Precautions Precautions: Fall Precaution Comments: BLE weeping ulcers; R charcot foot deformity w/ lateral deviation Restrictions Weight Bearing Restrictions: No      Mobility  Bed Mobility Overal bed mobility: Needs Assistance Bed Mobility: Rolling Rolling: Min assist         General bed mobility comments: Limited to bed level eval secondary to pain. Able to roll R/L with use of hand rails and minA due to pain. TotalA to scoot up in bed for repositioning  Transfers                    Ambulation/Gait                Stairs            Wheelchair Mobility    Modified Rankin (Stroke Patients Only)       Balance                                             Pertinent Vitals/Pain Faces Pain Scale:  Hurts whole lot Pain Location: BLE with movement (L>R) Pain Descriptors / Indicators: Aching;Shooting;Grimacing;Moaning Pain Intervention(s): Monitored during session;Limited activity within patient's tolerance;Repositioned    Home Living Family/patient expects to be discharged to:: Skilled nursing facility Living Arrangements: Spouse/significant other;Children               Additional Comments: Pt live at home with spouse and son    Prior Function Level of Independence: Needs assistance   Gait / Transfers Assistance Needed: 1-2 wks PTA, pt reliant on husband to assist for all ambulation, primarily using w/c (does not fit in bathroom, so son installed grab bars). Prior to this, pt has used rollator past 3-4 months  ADL's / Homemaking Assistance Needed: Husband assists with all ADLs. Pt does bird baths sitting on toilet; reliant on husband for toilet transfers        Hand Dominance        Extremity/Trunk Assessment   Upper Extremity Assessment Upper Extremity Assessment: Defer to OT evaluation    Lower Extremity Assessment Lower Extremity Assessment: LLE deficits/detail;RLE deficits/detail RLE Deficits / Details: h/o Charcot foot deformity with severe lateral deviation; BLE weeping wounds LLE Deficits / Details: Unable to lift LLE secondary to L hip pain;  BLE weeping wounds LLE: Unable to fully assess due to pain       Communication   Communication: No difficulties  Cognition Arousal/Alertness: Awake/alert Behavior During Therapy: WFL for tasks assessed/performed Overall Cognitive Status: Within Functional Limits for tasks assessed                                        General Comments      Exercises     Assessment/Plan    PT Assessment Patient needs continued PT services  PT Problem List Decreased strength;Decreased range of motion;Decreased activity tolerance;Decreased balance;Decreased mobility;Decreased skin integrity;Pain       PT  Treatment Interventions DME instruction;Gait training;Functional mobility training;Therapeutic activities;Therapeutic exercise;Balance training;Patient/family education;Wheelchair mobility training    PT Goals (Current goals can be found in the Care Plan section)  Acute Rehab PT Goals Patient Stated Goal: Decreased pain, then get stronger at rehab PT Goal Formulation: With patient Time For Goal Achievement: 06/13/17 Potential to Achieve Goals: Good    Frequency Min 2X/week   Barriers to discharge        Co-evaluation               AM-PAC PT "6 Clicks" Daily Activity  Outcome Measure Difficulty turning over in bed (including adjusting bedclothes, sheets and blankets)?: Unable Difficulty moving from lying on back to sitting on the side of the bed? : Unable Difficulty sitting down on and standing up from a chair with arms (e.g., wheelchair, bedside commode, etc,.)?: Unable Help needed moving to and from a bed to chair (including a wheelchair)?: A Lot Help needed walking in hospital room?: Total Help needed climbing 3-5 steps with a railing? : Total 6 Click Score: 7    End of Session   Activity Tolerance: Patient limited by pain Patient left: in bed;with call bell/phone within reach;with nursing/sitter in room Nurse Communication: Mobility status;Need for lift equipment PT Visit Diagnosis: Other abnormalities of gait and mobility (R26.89);Pain Pain - Right/Left: (Both (L>R)) Pain - part of body: Leg;Hip    Time: 1350-1408 PT Time Calculation (min) (ACUTE ONLY): 18 min   Charges:   PT Evaluation $PT Eval Moderate Complexity: 1 Mod     PT G Codes:       Ina Homes, PT, DPT Acute Rehab Services  Pager: [phone]  Malachy Chamber 05/30/2017, 2:20 PM

## 2017-05-31 DIAGNOSIS — E11610 Type 2 diabetes mellitus with diabetic neuropathic arthropathy: Secondary | ICD-10-CM

## 2017-05-31 DIAGNOSIS — N183 Chronic kidney disease, stage 3 (moderate): Secondary | ICD-10-CM

## 2017-05-31 LAB — GLUCOSE, CAPILLARY
Glucose-Capillary: 208 mg/dL — ABNORMAL HIGH (ref 65–99)
Glucose-Capillary: 303 mg/dL — ABNORMAL HIGH (ref 65–99)
Glucose-Capillary: 277 mg/dL — ABNORMAL HIGH (ref 65–99)

## 2017-05-31 LAB — BASIC METABOLIC PANEL
Anion gap: 7 (ref 5–15)
BUN: 50 mg/dL — ABNORMAL HIGH (ref 6–20)
CO2: 11 mmol/L — ABNORMAL LOW (ref 22–32)
Calcium: 8 mg/dL — ABNORMAL LOW (ref 8.9–10.3)
Chloride: 119 mmol/L — ABNORMAL HIGH (ref 101–111)
Creatinine, Ser: 1.76 mg/dL — ABNORMAL HIGH (ref 0.44–1.00)
GFR calc Af Amer: 31 mL/min — ABNORMAL LOW (ref >60)
GFR calc non Af Amer: 27 mL/min — ABNORMAL LOW (ref >60)
Glucose, Bld: 229 mg/dL — ABNORMAL HIGH (ref 65–99)
Potassium: 5.2 mmol/L — ABNORMAL HIGH (ref 3.5–5.1)
Sodium: 137 mmol/L (ref 135–145)

## 2017-05-31 MED ORDER — ACETAMINOPHEN 325 MG PO TABS
650.0000 mg | ORAL_TABLET | Freq: Three times a day (TID) | ORAL | Status: DC
  Administered 2017-05-31 – 2017-06-02 (×4): 650 mg via ORAL
  Filled 2017-05-31 (×6): qty 2

## 2017-05-31 MED ORDER — SODIUM POLYSTYRENE SULFONATE 15 GM/60ML PO SUSP
30.0000 g | ORAL | Status: DC
  Filled 2017-05-31 (×3): qty 120

## 2017-05-31 MED ORDER — HYDROCODONE-ACETAMINOPHEN 5-325 MG PO TABS
2.0000 | ORAL_TABLET | ORAL | Status: DC | PRN
  Administered 2017-05-31 – 2017-06-03 (×8): 2 via ORAL
  Filled 2017-05-31 (×8): qty 2

## 2017-05-31 MED ORDER — SODIUM POLYSTYRENE SULFONATE PO POWD
30.0000 g | ORAL | Status: AC
  Administered 2017-05-31 (×2): 30 g via ORAL
  Filled 2017-05-31 (×2): qty 30

## 2017-05-31 MED ORDER — SODIUM CHLORIDE 0.45 % IV SOLN
INTRAVENOUS | Status: DC
  Administered 2017-05-31 – 2017-06-01 (×4): via INTRAVENOUS

## 2017-05-31 MED ORDER — DICLOFENAC SODIUM 1 % TD GEL
2.0000 g | Freq: Four times a day (QID) | TRANSDERMAL | Status: DC
  Administered 2017-05-31 – 2017-06-03 (×13): 2 g via TOPICAL
  Filled 2017-05-31: qty 100

## 2017-05-31 NOTE — Progress Notes (Signed)
PROGRESS NOTE    Emily Miranda  QDI:[id number] DOB: [birth year] DOA: 05/29/2017 PCP: Noni Saupe, MD    Brief Narrative:  77 year old female who presented with lower extremity erythema and edema. Patient does have significant past medical history for chronic lymphedema, hypertension, dyslipidemia, type 2 diabetes mellitus, neuropathy, Charcot foot, chronic anemia, chronic kidney disease stage III, and coronary artery disease. Patient complaint of worsening edema in both lower extremities, associated with pain and heaviness. On initial physical examination blood pressure 06/01/2052, heart rate 52, respiratory 15, oxygen 100%. Was mucous membranes, lungs clear to auscultation bilaterally, heart S1-S2 present and rhythmic, abdomen was soft nontender, positive lower extremity edema to 3+, positive areas of clear transudate with multiple ulcers. Sodium 134, potassium 5.4, chloride 110, bicarbonate 15, glucose 227, BUN 75, creatinine 2.50, white count 7.3, he will 9.0, hematocrit 20 positive, platelets 229. EKG: Normal sinus rhythm, positive PACs, 72 bpm, normal axis, normal intervals.  Patient was admitted to the hospital working diagnosis of stasis dermatitis rule out cellulitis, complicated with acute kidney injury chronic kidney disease with hyperkalemia.   Assessment & Plan:   Principal Problem:   Stasis dermatitis of both legs Active Problems:   Hypertensive cardiomyopathy, with heart failure (HCC)   Constipation, chronic   SUI (stress urinary incontinence, female)   CKD (chronic kidney disease), stage III (HCC)   Chronic diabetic ulcer of right foot    PVD (peripheral vascular disease) (HCC)   Charcot foot due to diabetes mellitus (HCC)   Chronic anemia   Chronic combined systolic and diastolic heart failure (HCC)   Edema   Cellulitis of lower extremity   Acute renal failure (ARF) (HCC)  1. AKI on CKD stage 3 with hyperkalemia. Renal function improving with serum cr down to  1,76. K at 5,2 with serum bicarbonate at 11 with cl at 119. Will continue hydration with half normal saline at 75 ml, will follow on renal panel in am.   2. Stasis dermatitis. Continue to hold on antibiotic therapy, rash continue to improve, will continue local wound care. Out of bed as tolerated.   3. Hypertension. On coreg, hydralazine and isosorbide.   4. Type 2 diabetes mellitus. Glucose cover and monitoring with insulin sliding scale, capillary glucose 185, 167, 143, 186, 208, 208. Basal insulin 15 units at bedtime/.   5. Traumatic left hip pain. Imaging with no fracture, will continue pain control with topical voltaren and will add acetaminophen tid scheduled. Will continue physical therapy evaluation and out of bed as tolerated.   DVT prophylaxis: heparin   Code Status:  full Family Communication: no family at the bedside Disposition Plan: home   Consultants:     Procedures:     Antimicrobials   Subjective: Patient continue to have pain on her right hip, worse with movement and touch, improved with analgesics, persistent, moderate to severe in intensity. No nausea or vomiting.   Objective: Vitals:   05/30/17 1421 05/30/17 1953 05/31/17 0704 05/31/17 0825  BP: (!) 120/48 (!) 144/53 91/68 (!) 147/64  Pulse: 81 87 72 93  Resp: 19 20 18    Temp: 99.1 F (37.3 C) 99.3 F (37.4 C) 98.4 F (36.9 C)   TempSrc: Oral Oral Oral   SpO2: 100% 99% 98%   Weight:   59.5 kg (131 lb 2.8 oz)   Height:        Intake/Output Summary (Last 24 hours) at 05/31/2017 0853 Last data filed at 05/31/2017 0700 Gross per 24 hour  Intake  1680 ml  Output 1500 ml  Net 180 ml   Filed Weights   05/29/17 1833 05/30/17 0602 05/31/17 0704  Weight: 58.1 kg (128 lb 1.4 oz) 57.9 kg (127 lb 10.3 oz) 59.5 kg (131 lb 2.8 oz)    Examination:   General: Not in dyspnea, deconditoned Neurology: Awake and alert, non focal  E ENT: mild pallor, no icterus, oral mucosa moist Cardiovascular: No  JVD. S1-S2 present, rhythmic, no gallops, rubs, or murmurs. ++ non pitting lower extremity edema. Pulmonary: decreased breath sounds bilaterally, adequate air movement, no wheezing, rhonchi or rales. Gastrointestinal. Abdomen flat, no organomegaly, non tender, no rebound or guarding Skin. Improved erythema at the lower extremities, multiple superficial ulcers.  Musculoskeletal: ankle and feet deformities, chronic.      Data Reviewed: I have personally reviewed following labs and imaging studies  CBC: Recent Labs  Lab 05/29/17 1205 05/30/17 0638  WBC 7.4 7.3  NEUTROABS 5.2  --   HGB 9.7* 9.0*  HCT 30.1* 28.0*  MCV 84.3 85.4  PLT 238 229   Basic Metabolic Panel: Recent Labs  Lab 05/29/17 1205 05/29/17 1835 05/30/17 0638  NA 133* 134* 135  K 6.0* 5.4* 5.9*  CL 110 110 115*  CO2 13* 15* 15*  GLUCOSE 282* 227* 194*  BUN 78* 75* 67*  CREATININE 3.03* 2.50* 2.39*  CALCIUM 8.7* 8.6* 8.2*  MG  --   --  1.9  PHOS  --   --  3.5   GFR: Estimated Creatinine Clearance: 19 mL/min (A) (by C-G formula based on SCr of 2.39 mg/dL (H)). Liver Function Tests: Recent Labs  Lab 05/29/17 1205  AST 13*  ALT 15  ALKPHOS 107  BILITOT 0.4  PROT 5.9*  ALBUMIN 2.6*   No results for input(s): LIPASE, AMYLASE in the last 168 hours. No results for input(s): AMMONIA in the last 168 hours. Coagulation Profile: No results for input(s): INR, PROTIME in the last 168 hours. Cardiac Enzymes: Recent Labs  Lab 05/29/17 1835  TROPONINI 0.04*   BNP (last 3 results) No results for input(s): PROBNP in the last 8760 hours. HbA1C: Recent Labs    05/29/17 1446  HGBA1C 11.8*   CBG: Recent Labs  Lab 05/30/17 0711 05/30/17 1132 05/30/17 1700 05/30/17 2128 05/31/17 0712  GLUCAP 167* 143* 186* 208* 208*   Lipid Profile: No results for input(s): CHOL, HDL, LDLCALC, TRIG, CHOLHDL, LDLDIRECT in the last 72 hours. Thyroid Function Tests: No results for input(s): TSH, T4TOTAL, FREET4,  T3FREE, THYROIDAB in the last 72 hours. Anemia Panel: No results for input(s): VITAMINB12, FOLATE, FERRITIN, TIBC, IRON, RETICCTPCT in the last 72 hours.    Radiology Studies: I have reviewed all of the imaging during this hospital visit personally     Scheduled Meds: . atorvastatin  40 mg Oral q1800  . carvedilol  6.25 mg Oral BID WC  . clopidogrel  75 mg Oral Q breakfast  . collagenase   Topical Daily  . cyclobenzaprine  10 mg Oral TID  . hydrALAZINE  25 mg Oral Q8H  . insulin aspart  0-9 Units Subcutaneous TID WC  . insulin glargine  15 Units Subcutaneous QHS  . isosorbide mononitrate  30 mg Oral Daily  . mupirocin cream  1 application Topical BID  . pantoprazole  40 mg Oral Daily  . silver sulfADIAZINE   Topical Daily   Continuous Infusions: . sodium chloride 100 mL/hr at 05/31/17 0749     LOS: 2 days  Jamiyah Dingley Gerome Apley, MD [facility] Pager [phone]

## 2017-06-01 LAB — BASIC METABOLIC PANEL
Anion gap: 6 (ref 5–15)
BUN: 42 mg/dL — ABNORMAL HIGH (ref 6–20)
CO2: 13 mmol/L — ABNORMAL LOW (ref 22–32)
Calcium: 8.1 mg/dL — ABNORMAL LOW (ref 8.9–10.3)
Chloride: 118 mmol/L — ABNORMAL HIGH (ref 101–111)
Creatinine, Ser: 1.6 mg/dL — ABNORMAL HIGH (ref 0.44–1.00)
GFR calc Af Amer: 35 mL/min — ABNORMAL LOW (ref >60)
GFR calc non Af Amer: 30 mL/min — ABNORMAL LOW (ref >60)
Glucose, Bld: 210 mg/dL — ABNORMAL HIGH (ref 65–99)
Potassium: 5 mmol/L (ref 3.5–5.1)
Sodium: 137 mmol/L (ref 135–145)

## 2017-06-01 LAB — GLUCOSE, CAPILLARY
Glucose-Capillary: 240 mg/dL — ABNORMAL HIGH (ref 65–99)
Glucose-Capillary: 208 mg/dL — ABNORMAL HIGH (ref 65–99)
Glucose-Capillary: 292 mg/dL — ABNORMAL HIGH (ref 65–99)
Glucose-Capillary: 256 mg/dL — ABNORMAL HIGH (ref 65–99)
Glucose-Capillary: 288 mg/dL — ABNORMAL HIGH (ref 65–99)

## 2017-06-01 MED ORDER — POLYETHYLENE GLYCOL 3350 17 G PO PACK
17.0000 g | PACK | Freq: Two times a day (BID) | ORAL | Status: DC
  Administered 2017-06-02: 17 g via ORAL
  Filled 2017-06-01 (×4): qty 1

## 2017-06-01 MED ORDER — BISACODYL 10 MG RE SUPP
10.0000 mg | Freq: Once | RECTAL | Status: AC
  Administered 2017-06-01: 10 mg via RECTAL
  Filled 2017-06-01: qty 1

## 2017-06-01 MED ORDER — INSULIN GLARGINE 100 UNIT/ML ~~LOC~~ SOLN
30.0000 [IU] | Freq: Every day | SUBCUTANEOUS | Status: DC
  Administered 2017-06-01 – 2017-06-02 (×2): 30 [IU] via SUBCUTANEOUS
  Filled 2017-06-01 (×3): qty 0.3

## 2017-06-01 NOTE — NC FL2 (Signed)
[place] MEDICAID FL2 LEVEL OF CARE SCREENING TOOL     IDENTIFICATION  Patient Name: Emily Miranda Birthdate: 1940-05-29 Sex: female Admission Date (Current Location): 05/29/2017  [facility] [place] Number:  Producer, television/film/video and Address:  [facility] [place], [address], [place], [place] [address]      Provider Number: [id number]  Attending Physician Name and Address:  Coralie Keens  Relative Name and Phone Number:       Current Level of Care:   Recommended Level of Care: Skilled Nursing Facility Prior Approval Number:    Date Approved/Denied:   PASRR Number: [id number]  Discharge Plan: SNF    Current Diagnoses: Patient Active Problem List   Diagnosis Date Noted  . Cellulitis of lower extremity 05/29/2017  . Acute renal failure (ARF) (HCC) 05/29/2017  . Stasis dermatitis of both legs 05/29/2017  . Edema 11/24/2016  . Chronic combined systolic and diastolic heart failure (HCC) 11/09/2016  . Unstable angina (HCC) 11/04/2016  . Charcot foot due to diabetes mellitus (HCC) 10/31/2016  . Chronic anemia 10/31/2016  . Mixed hyperlipidemia   . PVD (peripheral vascular disease) (HCC)   . CAD S/P percutaneous coronary angioplasty   . Non-ST elevation (NSTEMI) myocardial infarction (HCC) 10/06/2016  . NSTEMI (non-ST elevated myocardial infarction) (HCC) 10/06/2016  . CKD (chronic kidney disease), stage III (HCC) 08/27/2016  . Chronic diabetic ulcer of right foot  08/27/2016  . Loss of weight   . Acute combined systolic and diastolic congestive heart failure (HCC)   . Shortness of breath 07/29/2016  . Wound infection 11/13/2015  . Abdominal pain, RLQ (right lower quadrant) -chronic since 2009 06/02/2013  . Constipation, chronic 06/02/2013  . SUI (stress urinary incontinence, female) 06/02/2013  . Recurrent UTI (urinary tract infection) 06/02/2013  . Hypertensive cardiomyopathy, with heart failure (HCC)   . CHEST PAIN 11/27/2009   . OVERWEIGHT 11/28/2008  . DM type 2 causing CKD stage 3 (HCC) 11/25/2008  . Mild aortic stenosis 11/25/2008  . PANCREATITIS, HX OF 11/25/2008    Orientation RESPIRATION BLADDER Height & Weight     Self, Time, Situation, Place  Normal External catheter(Placed 1/17) Weight: 138 lb 10.7 oz (62.9 kg) Height:  5\' 6"  (167.6 cm)  BEHAVIORAL SYMPTOMS/MOOD NEUROLOGICAL BOWEL NUTRITION STATUS      Continent (Please see d/c summary)  AMBULATORY STATUS COMMUNICATION OF NEEDS Skin   Limited Assist Verbally PU Stage and Appropriate Care   PU Stage 2 Dressing: Daily(Mid sacrum, Guaze dressing)                   Personal Care Assistance Level of Assistance  Bathing, Feeding, Dressing Bathing Assistance: Limited assistance Feeding assistance: Independent Dressing Assistance: Limited assistance     Functional Limitations Info  Sight, Hearing, Speech Sight Info: Adequate Hearing Info: Adequate Speech Info: Adequate    SPECIAL CARE FACTORS FREQUENCY  PT (By licensed PT), OT (By licensed OT)     PT Frequency: 2x OT Frequency: 2x            Contractures Contractures Info: Not present    Additional Factors Info  Code Status, Allergies Code Status Info: Full Code Allergies Info: Levofloxacin, Amlodipine, Gabapentin, Hydrochlorothiazide, Ibuprofen, Metformin And Related, Simvastatin           Current Medications (06/01/2017):  This is the current hospital active medication list Current Facility-Administered Medications  Medication Dose Route Frequency Provider Last Rate Last Dose  . 0.45 % sodium chloride infusion  Intravenous Continuous Coralie Keens, MD 75 mL/hr at 06/01/17 [phone]    . acetaminophen (TYLENOL) tablet 650 mg  650 mg Oral TID Coralie Keens, MD   650 mg at 05/31/17 2303  . atorvastatin (LIPITOR) tablet 40 mg  40 mg Oral q1800 Marcos Eke, PA-C   40 mg at 05/31/17 1711  . carvedilol (COREG) tablet 6.25 mg  6.25 mg Oral BID WC Marlowe Kays  E, PA-C   6.25 mg at 06/01/17 0846  . clopidogrel (PLAVIX) tablet 75 mg  75 mg Oral Q breakfast Marcos Eke, PA-C   75 mg at 06/01/17 0846  . collagenase (SANTYL) ointment   Topical Daily Murriel Holwerda, York Ram, MD      . cyclobenzaprine (FLEXERIL) tablet 10 mg  10 mg Oral TID Marlowe Kays E, PA-C   10 mg at 06/01/17 1100  . diclofenac sodium (VOLTAREN) 1 % transdermal gel 2 g  2 g Topical QID Jenel Gierke, York Ram, MD   2 g at 06/01/17 1100  . hydrALAZINE (APRESOLINE) tablet 25 mg  25 mg Oral Q8H Marcos Eke, PA-C   25 mg at 06/01/17 9604  . HYDROcodone-acetaminophen (NORCO/VICODIN) 5-325 MG per tablet 2 tablet  2 tablet Oral Q4H PRN Chattie Greeson, York Ram, MD   2 tablet at 06/01/17 1423  . insulin aspart (novoLOG) injection 0-9 Units  0-9 Units Subcutaneous TID WC Marcos Eke, PA-C   5 Units at 06/01/17 1242  . insulin glargine (LANTUS) injection 30 Units  30 Units Subcutaneous QHS Tieler Cournoyer, York Ram, MD      . isosorbide mononitrate (IMDUR) 24 hr tablet 30 mg  30 mg Oral Daily Marlowe Kays E, PA-C   30 mg at 06/01/17 1100  . morphine 4 MG/ML injection 1 mg  1 mg Intravenous Q4H PRN Marcos Eke, PA-C   1 mg at 05/30/17 2022  . mupirocin cream (BACTROBAN) 2 % 1 application  1 application Topical BID Latif Nazareno, York Ram, MD   1 application at 06/01/17 1100  . ondansetron (ZOFRAN) tablet 4 mg  4 mg Oral Q6H PRN Marcos Eke, PA-C       Or  . ondansetron (ZOFRAN) injection 4 mg  4 mg Intravenous Q6H PRN Marcos Eke, PA-C      . pantoprazole (PROTONIX) EC tablet 40 mg  40 mg Oral Daily Marlowe Kays E, PA-C   40 mg at 06/01/17 1100  . polyethylene glycol (MIRALAX / GLYCOLAX) packet 17 g  17 g Oral BID Edon Hoadley, York Ram, MD      . polyethylene glycol 0.4% and propylene glycol 0.3% (SYSTANE) ophthalmic gel   Both Eyes Daily PRN Marcos Eke, PA-C      . senna-docusate (Senokot-S) tablet 1 tablet  1 tablet Oral QHS PRN Marcos Eke, PA-C      . silver  sulfADIAZINE (SILVADENE) 1 % cream   Topical Daily Caleyah Jr, York Ram, MD         Discharge Medications: Please see discharge summary for a list of discharge medications.  Relevant Imaging Results:  Relevant Lab Results:   Additional Information SSN: [id number]  Maree Krabbe, LCSW

## 2017-06-01 NOTE — Progress Notes (Signed)
Sticky Note On E-Chart Front  Weight increase today 3kg.  Pt on plavix otherwise VTE order for SCDs isnt plausible due to cellulitis of the BLE.  Suzan Slick Tobias-Diakun,RN 06/01/17 7:34 AM

## 2017-06-01 NOTE — Progress Notes (Signed)
PROGRESS NOTE    Emily Miranda  UJW:[id number] DOB: February 16, 1941 DOA: 05/29/2017 PCP: Noni Saupe, MD    Brief Narrative:  77 year old female who presented with lower extremity erythema and edema. Patient does have significant past medical history forchronic lymphedema, hypertension, dyslipidemia, type 2 diabetes mellitus, neuropathy, Charcot foot, chronic anemia,chronic kidney disease stage III,and coronary artery disease.Patient complaint of worsening edema in both lower extremities, associated with pain and heaviness.On initial physical examination blood pressure 06/01/2052, heart rate 52, respiratory 15, oxygen 100%.Was mucous membranes, lungs clear to auscultation bilaterally, heart S1-S2 present and rhythmic, abdomen was soft nontender, positive lower extremity edema to 3+,positive areas of clear transudate with multiple ulcers.Sodium 134, potassium 5.4, chloride 110, bicarbonate 15, glucose 227, BUN 75, creatinine 2.50,white count 7.3, he will 9.0, hematocrit 20 positive, platelets 229.EKG: Normal sinus rhythm, positive PACs, 72 bpm, normal axis, normal intervals.  Patient was admitted to the hospital working diagnosis of stasis dermatitis rule out cellulitis, complicatedwith acute kidney injury chronic kidney disease with hyperkalemia.   Assessment & Plan:   Principal Problem:   Stasis dermatitis of both legs Active Problems:   Hypertensive cardiomyopathy, with heart failure (HCC)   Constipation, chronic   SUI (stress urinary incontinence, female)   CKD (chronic kidney disease), stage III (HCC)   Chronic diabetic ulcer of right foot    PVD (peripheral vascular disease) (HCC)   Charcot foot due to diabetes mellitus (HCC)   Chronic anemia   Chronic combined systolic and diastolic heart failure (HCC)   Edema   Cellulitis of lower extremity   Acute renal failure (ARF) (HCC)   1. AKI on CKD stage 3with hyperkalemia. Renal function continue to improve with  serum cr down to 1,60 K, at 5,0 with serum bicarbonate at 13 with cl at 118. Continue hydration with half normal saline at 75 ml. No signs of volume overload, urine output  1,600 ml. Patient sp lactulose therapy but no bowel movement.   2.Stasis dermatitis. No antibiotic therapy, continue with local wound care. Physical therapy.  3.Hypertension. Continue with coreg, hydralazine and isosorbide.Systolic blood pressure 142.   4.Type 2 diabetes mellitus. Continue glucose cover and monitoring with insulin sliding scale, capillary glucose 240, 303, 277, 208, 292. Will resume home dose of basal insulin 30 units at bedtime.  5. Traumatic left hip pain. Improved pain, will continue local voltaren and as needed hydrocodone. Continue flexeril.  6. Constipation. Add dulcolax suppository today, increase miralax to bid. Out of bed as tolerated.   DVT prophylaxis:heparin Code Status:full Family Communication:no family at the bedside Disposition Plan:home   Consultants:    Procedures:    Antimicrobials   Subjective: Patient feeling better, left hip pain persistent but improved in intensity, worse with movement, no radiation, moderate in intensity, no associated symptoms. No bowel movement.   Objective: Vitals:   05/31/17 1200 05/31/17 1700 05/31/17 2102 06/01/17 0338  BP: (!) 144/46 (!) 145/66 (!) 156/53 (!) 142/57  Pulse: 68  71 82  Resp: 20  20 18   Temp: 97.6 F (36.4 C)  98.4 F (36.9 C) 98.8 F (37.1 C)  TempSrc: Oral  Oral Oral  SpO2: 99%  98% 97%  Weight:    62.9 kg (138 lb 10.7 oz)  Height:        Intake/Output Summary (Last 24 hours) at 06/01/2017 0847 Last data filed at 06/01/2017 0644 Gross per 24 hour  Intake 1775 ml  Output 1600 ml  Net 175 ml   [employer]  05/30/17 0602 05/31/17 0704 06/01/17 0338  Weight: 57.9 kg (127 lb 10.3 oz) 59.5 kg (131 lb 2.8 oz) 62.9 kg (138 lb 10.7 oz)    Examination:   General: Not in pain or dyspnea,  deconditioned Neurology: Awake and alert, non focal  E ENT: mild pallor, no icterus, oral mucosa moist Cardiovascular: No JVD. S1-S2 present, rhythmic, no gallops, rubs, or murmurs. Non pitting edema on the right lower extremity edema. Pulmonary: vesicular breath sounds bilaterally, adequate air movement, no wheezing, rhonchi or rales. Gastrointestinal. Abdomen flat, no organomegaly, non tender, no rebound or guarding Skin. No rashes Musculoskeletal: bilateral feet and ankle deformities.      Data Reviewed: I have personally reviewed following labs and imaging studies  CBC: Recent Labs  Lab 05/29/17 1205 05/30/17 0638  WBC 7.4 7.3  NEUTROABS 5.2  --   HGB 9.7* 9.0*  HCT 30.1* 28.0*  MCV 84.3 85.4  PLT 238 229   Basic Metabolic Panel: Recent Labs  Lab 05/29/17 1205 05/29/17 1835 05/30/17 0638 05/31/17 0919  NA 133* 134* 135 137  K 6.0* 5.4* 5.9* 5.2*  CL 110 110 115* 119*  CO2 13* 15* 15* 11*  GLUCOSE 282* 227* 194* 229*  BUN 78* 75* 67* 50*  CREATININE 3.03* 2.50* 2.39* 1.76*  CALCIUM 8.7* 8.6* 8.2* 8.0*  MG  --   --  1.9  --   PHOS  --   --  3.5  --    GFR: Estimated Creatinine Clearance: 25.9 mL/min (A) (by C-G formula based on SCr of 1.76 mg/dL (H)). Liver Function Tests: Recent Labs  Lab 05/29/17 1205  AST 13*  ALT 15  ALKPHOS 107  BILITOT 0.4  PROT 5.9*  ALBUMIN 2.6*   No results for input(s): LIPASE, AMYLASE in the last 168 hours. No results for input(s): AMMONIA in the last 168 hours. Coagulation Profile: No results for input(s): INR, PROTIME in the last 168 hours. Cardiac Enzymes: Recent Labs  Lab 05/29/17 1835  TROPONINI 0.04*   BNP (last 3 results) No results for input(s): PROBNP in the last 8760 hours. HbA1C: Recent Labs    05/29/17 1446  HGBA1C 11.8*   CBG: Recent Labs  Lab 05/31/17 0712 05/31/17 1159 05/31/17 1617 05/31/17 2119 06/01/17 0734  GLUCAP 208* 240* 303* 277* 208*   Lipid Profile: No results for input(s):  CHOL, HDL, LDLCALC, TRIG, CHOLHDL, LDLDIRECT in the last 72 hours. Thyroid Function Tests: No results for input(s): TSH, T4TOTAL, FREET4, T3FREE, THYROIDAB in the last 72 hours. Anemia Panel: No results for input(s): VITAMINB12, FOLATE, FERRITIN, TIBC, IRON, RETICCTPCT in the last 72 hours.    Radiology Studies: I have reviewed all of the imaging during this hospital visit personally     Scheduled Meds: . acetaminophen  650 mg Oral TID  . atorvastatin  40 mg Oral q1800  . carvedilol  6.25 mg Oral BID WC  . clopidogrel  75 mg Oral Q breakfast  . collagenase   Topical Daily  . cyclobenzaprine  10 mg Oral TID  . diclofenac sodium  2 g Topical QID  . hydrALAZINE  25 mg Oral Q8H  . insulin aspart  0-9 Units Subcutaneous TID WC  . insulin glargine  15 Units Subcutaneous QHS  . isosorbide mononitrate  30 mg Oral Daily  . mupirocin cream  1 application Topical BID  . pantoprazole  40 mg Oral Daily  . silver sulfADIAZINE   Topical Daily   Continuous Infusions: . sodium chloride 75 mL/hr at 06/01/17  5110     LOS: 3 days        Coralie Keens, MD [facility] Pager [phone]

## 2017-06-02 LAB — GLUCOSE, CAPILLARY
Glucose-Capillary: 156 mg/dL — ABNORMAL HIGH (ref 65–99)
Glucose-Capillary: 181 mg/dL — ABNORMAL HIGH (ref 65–99)
Glucose-Capillary: 182 mg/dL — ABNORMAL HIGH (ref 65–99)
Glucose-Capillary: 265 mg/dL — ABNORMAL HIGH (ref 65–99)

## 2017-06-02 LAB — BASIC METABOLIC PANEL
Anion gap: 4 — ABNORMAL LOW (ref 5–15)
BUN: 40 mg/dL — ABNORMAL HIGH (ref 6–20)
CO2: 17 mmol/L — ABNORMAL LOW (ref 22–32)
Calcium: 8.2 mg/dL — ABNORMAL LOW (ref 8.9–10.3)
Chloride: 118 mmol/L — ABNORMAL HIGH (ref 101–111)
Creatinine, Ser: 1.52 mg/dL — ABNORMAL HIGH (ref 0.44–1.00)
GFR calc Af Amer: 38 mL/min — ABNORMAL LOW (ref >60)
GFR calc non Af Amer: 32 mL/min — ABNORMAL LOW (ref >60)
Glucose, Bld: 228 mg/dL — ABNORMAL HIGH (ref 65–99)
Potassium: 5 mmol/L (ref 3.5–5.1)
Sodium: 139 mmol/L (ref 135–145)

## 2017-06-02 MED ORDER — SODIUM POLYSTYRENE SULFONATE PO POWD
30.0000 g | Freq: Once | ORAL | Status: AC
  Administered 2017-06-02: 30 g via ORAL
  Filled 2017-06-02: qty 30

## 2017-06-02 MED ORDER — SODIUM POLYSTYRENE SULFONATE 15 GM/60ML PO SUSP
30.0000 g | Freq: Once | ORAL | Status: DC
  Filled 2017-06-02: qty 120

## 2017-06-02 NOTE — Social Work (Signed)
CSW met with patient to discuss SNF offers as it appears only offers sent to [facility]. Pt indicated that she would prefer [facility] in [place] or [place].  CSW will send out to those SNF's to see if they can offer a SNF bed.  CSW will f/u.  Elissa Hefty, LCSW Clinical Social Worker [phone]

## 2017-06-02 NOTE — Progress Notes (Signed)
Inpatient Diabetes Program Recommendations  AACE/ADA: New Consensus Statement on Inpatient Glycemic Control (2015)  Target Ranges:  Prepandial:   less than 140 mg/dL      Peak postprandial:   less than 180 mg/dL (1-2 hours)      Critically ill patients:  140 - 180 mg/dL   Lab Results  Component Value Date   GLUCAP 156 (H) 06/02/2017   HGBA1C 11.8 (H) 05/29/2017    Review of Glycemic Control Results for COSETTA, QU (MRN [id number]) as of 06/02/2017 11:04  Ref. Range 06/01/2017 07:34 06/01/2017 11:27 06/01/2017 16:48 06/01/2017 21:07 06/02/2017 07:15  Glucose-Capillary Latest Ref Range: 65 - 99 mg/dL 127 (H) 517 (H) 001 (H) 288 (H) 156 (H)   Diabetes history: DM2 Outpatient Diabetes medications: Lantus 30 U every HS, Amaryl Current orders for Inpatient glycemic control: Lantus 30 units + Novolog sensitive correction tid  Inpatient Diabetes Program Recommendations:   -Novolog 5 units tid meal coverage if eats 50% while off of Amaryl -Add Novolog hs correction 0-5 units  Thank you, Darel Hong E. Alegra Rost, RN, MSN, CDE  Diabetes Coordinator Inpatient Glycemic Control Team Team Pager [phone] (8am-5pm) 06/02/2017 11:05 AM

## 2017-06-02 NOTE — Clinical Social Work Note (Signed)
Clinical Social Work Assessment  Patient Details  Name: Emily Miranda MRN: [id number] Date of Birth: [birth year]  Date of referral:  06/01/17               Reason for consult:  Facility Placement                Permission sought to share information with:    Permission granted to share information::  Yes, Release of Information Signed  Name::     Emily Miranda  Agency::     Relationship::  Spouse  Contact Information:     Housing/Transportation Living arrangements for the past 2 months:  Single Family Home Source of Information:  Patient Patient Interpreter Needed:  None Criminal Activity/Legal Involvement Pertinent to Current Situation/Hospitalization:  No - Comment as needed Significant Relationships:  Adult Children, Spouse Lives with:  Adult Children, Spouse Do you feel safe going back to the place where you live?  No Need for family participation in patient care:  No (Coment)  Care giving concerns:  Pt resides with son and spouse at home, however, given new impairment, spouse is unable to care for her at this time. Prior to this hospitalization, spouse assisted patient with ADL's as patient could not stand and required daily assistance.  Pt reports experience with SNF in the past. CSW obtained permission to send to SNF's in [place]. Pt desires [facility] or [place] of [address] CSW will f/u. CSW discussed SNF will need to obtain Insurance Auth for placement. Pt voiced agreement.  Social Worker assessment / plan:  CSW will f/u for disposition.  Employment status:  Retired Database administrator PT Recommendations:  Skilled Nursing Facility Information / Referral to community resources:  Skilled Nursing Facility  Patient/Family's Response to care:  Patient appreciative of CSW assistance with discussing SNF options.  Patient/Family's Understanding of and Emotional Response to Diagnosis, Current Treatment, and Prognosis:  Pt has good understanding of her  limitations and barriers as she voiced the difficulties she has had in the past as her spouse had to assist with ADL's. In addition, pt has experience with SNF's placement and identify that she cannot return to home until she receives some short term therapy. CSW will continue to follow for disposition.  Emotional Assessment Appearance:  Appears stated age Attitude/Demeanor/Rapport:  (Cooperative) Affect (typically observed):  Accepting, Appropriate Orientation:  Oriented to Situation, Oriented to  Time, Oriented to Place, Oriented to Self Alcohol / Substance use:  Not Applicable Psych involvement (Current and /or in the community):  No (Comment)  Discharge Needs  Concerns to be addressed:  Discharge Planning Concerns Readmission within the last 30 days:  No Current discharge risk:  Dependent with Mobility, Physical Impairment Barriers to Discharge:  No Barriers Identified   Tresa Moore, LCSW 06/02/2017, 2:37 PM

## 2017-06-02 NOTE — Consult Note (Signed)
   [facility] CM Inpatient Consult   06/02/2017  ELAJAH KUNSMAN 07-23-1940 [id number]  Patient was assessed for [place] Management for community services. Patient was previously active with [place] Management with [place] Coordinator and [facility] Social Worker.   Met with patient at bedside regarding being restarted with [facility] services.  Active signed consent form on file and a brochure with [place] Management information given. Patient is currently listed to go to a skilled facility. Will follow for disposition and transition needs. Primary Care Provider:  Dr.[name] Redding II.   Of note, [facility] Care Management services does not replace or interfere with any services that are arranged by inpatient case management or social work. For additional questions or referrals please contact:  Natividad Brood, RN BSN [facility] Liaison  [phone] business mobile phone Toll free office [phone]

## 2017-06-02 NOTE — Progress Notes (Signed)
PROGRESS NOTE    EDIT RICCIARDELLI  ZOX:[id number] DOB: October 25, 1940 DOA: 05/29/2017 PCP: Noni Saupe, MD    Brief Narrative:  77 year old female who presented with lower extremity erythema and edema. Patient does have significant past medical history forchronic lymphedema, hypertension, dyslipidemia, type 2 diabetes mellitus, neuropathy, Charcot foot, chronic anemia,chronic kidney disease stage III,and coronary artery disease.Patient complaint of worsening edema in both lower extremities, associated with pain and heaviness.On initial physical examination blood pressure 06/01/2052, heart rate 52, respiratory 15, oxygen 100%.Was mucous membranes, lungs clear to auscultation bilaterally, heart S1-S2 present and rhythmic, abdomen was soft nontender, positive lower extremity edema to 3+,positive areas of clear transudate with multiple ulcers.Sodium 134, potassium 5.4, chloride 110, bicarbonate 15, glucose 227, BUN 75, creatinine 2.50,white count 7.3, he will 9.0, hematocrit 20 positive, platelets 229.EKG: Normal sinus rhythm, positive PACs, 72 bpm, normal axis, normal intervals.  Patient was admitted to the hospital working diagnosis of stasis dermatitis rule out cellulitis, complicatedwith acute kidney injury chronic kidney disease with hyperkalemia.   Assessment & Plan:   Principal Problem:   Stasis dermatitis of both legs Active Problems:   Hypertensive cardiomyopathy, with heart failure (HCC)   Constipation, chronic   SUI (stress urinary incontinence, female)   CKD (chronic kidney disease), stage III (HCC)   Chronic diabetic ulcer of right foot    PVD (peripheral vascular disease) (HCC)   Charcot foot due to diabetes mellitus (HCC)   Chronic anemia   Chronic combined systolic and diastolic heart failure (HCC)   Edema   Cellulitis of lower extremity   Acute renal failure (ARF) (HCC)  1. AKI on CKD stage 3with hyperkalemia.Serum cr down to 1,52 with K, at 5,0 with  serum bicarbonate at 17 with cl at 118. Renal function at it's baseline, will hold on IV fluids, and will continue to follow renal panel in am. One dose of kayexalate today for hyperkalemia.  2.Stasis dermatitis.Local wound care. Physical therapy. Plan to discharge to SNF.   3.Hypertension.Tolerating wellcoreg, hydralazine and isosorbide.  4.Type 2 diabetes mellitus.Continue glucose cover and monitoring with insulin sliding scale, capillary glucose292, 256, 288, 156, 181.Improved glycemia after incrasing basal insulin to 30 units at bedtime.  5. Traumatic left hip pain. On topical voltaren and as needed po hydrocodone. Continue flexeril, out of bed as tolerated, pending snf at discharge.   6. Constipation. Positive bowel movement, continue miralax bid.  DVT prophylaxis:heparin Code Status:full Family Communication:no family at the bedside Disposition Plan:home   Consultants:    Procedures:    Antimicrobials   Subjective: Patient is feeling better, positive bowel movement yesterday, hip pain on the left improved but still present and having difficulty mobilizing.   Objective: Vitals:   06/01/17 0338 06/01/17 1238 06/01/17 2105 06/02/17 0500  BP: (!) 142/57 (!) 142/52 (!) 135/51 (!) 145/58  Pulse: 82 65 65 (!) 58  Resp: 18 18 18 18   Temp: 98.8 F (37.1 C) 98.8 F (37.1 C) 98.2 F (36.8 C) (!) 97.3 F (36.3 C)  TempSrc: Oral Oral Oral Oral  SpO2: 97% 100% 98% 98%  Weight: 62.9 kg (138 lb 10.7 oz)   65.7 kg (144 lb 13.5 oz)  Height:        Intake/Output Summary (Last 24 hours) at 06/02/2017 1027 Last data filed at 06/02/2017 0700 Gross per 24 hour  Intake 995 ml  Output 576 ml  Net 419 ml   Filed Weights   05/31/17 0704 06/01/17 0338 06/02/17 0500  Weight: 59.5 kg (131  lb 2.8 oz) 62.9 kg (138 lb 10.7 oz) 65.7 kg (144 lb 13.5 oz)    Examination:   General: Not in pain or dyspnea, deconditioned Neurology: Awake and alert, non  focal  E ENT: mild pallor, no icterus, oral mucosa moist Cardiovascular: No JVD. S1-S2 present, rhythmic, no gallops, rubs, or murmurs. Trace lower extremity edema. Pulmonary: vesicular breath sounds bilaterally, adequate air movement, no wheezing, rhonchi or rales. Gastrointestinal. Abdomen flat, no organomegaly, non tender, no rebound or guarding Skin. Faint rash anterior legs bilateral.  Musculoskeletal: bilateral feet and ankle deformities.      Data Reviewed: I have personally reviewed following labs and imaging studies  CBC: Recent Labs  Lab 05/29/17 1205 05/30/17 0638  WBC 7.4 7.3  NEUTROABS 5.2  --   HGB 9.7* 9.0*  HCT 30.1* 28.0*  MCV 84.3 85.4  PLT 238 229   Basic Metabolic Panel: Recent Labs  Lab 05/29/17 1835 05/30/17 0638 05/31/17 0919 06/01/17 0742 06/02/17 0440  NA 134* 135 137 137 139  K 5.4* 5.9* 5.2* 5.0 5.0  CL 110 115* 119* 118* 118*  CO2 15* 15* 11* 13* 17*  GLUCOSE 227* 194* 229* 210* 228*  BUN 75* 67* 50* 42* 40*  CREATININE 2.50* 2.39* 1.76* 1.60* 1.52*  CALCIUM 8.6* 8.2* 8.0* 8.1* 8.2*  MG  --  1.9  --   --   --   PHOS  --  3.5  --   --   --    GFR: Estimated Creatinine Clearance: 29.9 mL/min (A) (by C-G formula based on SCr of 1.52 mg/dL (H)). Liver Function Tests: Recent Labs  Lab 05/29/17 1205  AST 13*  ALT 15  ALKPHOS 107  BILITOT 0.4  PROT 5.9*  ALBUMIN 2.6*   No results for input(s): LIPASE, AMYLASE in the last 168 hours. No results for input(s): AMMONIA in the last 168 hours. Coagulation Profile: No results for input(s): INR, PROTIME in the last 168 hours. Cardiac Enzymes: Recent Labs  Lab 05/29/17 1835  TROPONINI 0.04*   BNP (last 3 results) No results for input(s): PROBNP in the last 8760 hours. HbA1C: No results for input(s): HGBA1C in the last 72 hours. CBG: Recent Labs  Lab 06/01/17 0734 06/01/17 1127 06/01/17 1648 06/01/17 2107 06/02/17 0715  GLUCAP 208* 292* 256* 288* 156*   Lipid Profile: No  results for input(s): CHOL, HDL, LDLCALC, TRIG, CHOLHDL, LDLDIRECT in the last 72 hours. Thyroid Function Tests: No results for input(s): TSH, T4TOTAL, FREET4, T3FREE, THYROIDAB in the last 72 hours. Anemia Panel: No results for input(s): VITAMINB12, FOLATE, FERRITIN, TIBC, IRON, RETICCTPCT in the last 72 hours.    Radiology Studies: I have reviewed all of the imaging during this hospital visit personally     Scheduled Meds: . acetaminophen  650 mg Oral TID  . atorvastatin  40 mg Oral q1800  . carvedilol  6.25 mg Oral BID WC  . clopidogrel  75 mg Oral Q breakfast  . collagenase   Topical Daily  . cyclobenzaprine  10 mg Oral TID  . diclofenac sodium  2 g Topical QID  . hydrALAZINE  25 mg Oral Q8H  . insulin aspart  0-9 Units Subcutaneous TID WC  . insulin glargine  30 Units Subcutaneous QHS  . isosorbide mononitrate  30 mg Oral Daily  . mupirocin cream  1 application Topical BID  . pantoprazole  40 mg Oral Daily  . polyethylene glycol  17 g Oral BID  . silver sulfADIAZINE   Topical Daily  Continuous Infusions: . sodium chloride 75 mL/hr at 06/01/17 2305     LOS: 4 days        Mauricio Annett Gula, MD [facility] Pager [phone]

## 2017-06-03 ENCOUNTER — Telehealth: Payer: Self-pay | Admitting: Sports Medicine

## 2017-06-03 DIAGNOSIS — M25552 Pain in left hip: Secondary | ICD-10-CM | POA: Diagnosis not present

## 2017-06-03 DIAGNOSIS — S90929A Unspecified superficial injury of unspecified foot, initial encounter: Secondary | ICD-10-CM | POA: Diagnosis not present

## 2017-06-03 DIAGNOSIS — K219 Gastro-esophageal reflux disease without esophagitis: Secondary | ICD-10-CM | POA: Diagnosis not present

## 2017-06-03 DIAGNOSIS — L8992 Pressure ulcer of unspecified site, stage 2: Secondary | ICD-10-CM | POA: Diagnosis not present

## 2017-06-03 DIAGNOSIS — R262 Difficulty in walking, not elsewhere classified: Secondary | ICD-10-CM | POA: Diagnosis not present

## 2017-06-03 DIAGNOSIS — N183 Chronic kidney disease, stage 3 (moderate): Secondary | ICD-10-CM | POA: Diagnosis not present

## 2017-06-03 DIAGNOSIS — Z9861 Coronary angioplasty status: Secondary | ICD-10-CM | POA: Diagnosis not present

## 2017-06-03 DIAGNOSIS — G9009 Other idiopathic peripheral autonomic neuropathy: Secondary | ICD-10-CM | POA: Diagnosis not present

## 2017-06-03 DIAGNOSIS — M1460 Charcot's joint, unspecified site: Secondary | ICD-10-CM | POA: Diagnosis not present

## 2017-06-03 DIAGNOSIS — I2510 Atherosclerotic heart disease of native coronary artery without angina pectoris: Secondary | ICD-10-CM | POA: Diagnosis not present

## 2017-06-03 DIAGNOSIS — Z9089 Acquired absence of other organs: Secondary | ICD-10-CM | POA: Diagnosis not present

## 2017-06-03 DIAGNOSIS — E708 Other disorders of aromatic amino-acid metabolism: Secondary | ICD-10-CM | POA: Diagnosis not present

## 2017-06-03 DIAGNOSIS — E11622 Type 2 diabetes mellitus with other skin ulcer: Secondary | ICD-10-CM | POA: Diagnosis not present

## 2017-06-03 DIAGNOSIS — N179 Acute kidney failure, unspecified: Secondary | ICD-10-CM | POA: Diagnosis not present

## 2017-06-03 DIAGNOSIS — E119 Type 2 diabetes mellitus without complications: Secondary | ICD-10-CM | POA: Diagnosis not present

## 2017-06-03 DIAGNOSIS — R5381 Other malaise: Secondary | ICD-10-CM | POA: Diagnosis not present

## 2017-06-03 DIAGNOSIS — I252 Old myocardial infarction: Secondary | ICD-10-CM | POA: Diagnosis not present

## 2017-06-03 DIAGNOSIS — K8581 Other acute pancreatitis with uninfected necrosis: Secondary | ICD-10-CM | POA: Diagnosis not present

## 2017-06-03 DIAGNOSIS — I129 Hypertensive chronic kidney disease with stage 1 through stage 4 chronic kidney disease, or unspecified chronic kidney disease: Secondary | ICD-10-CM | POA: Diagnosis not present

## 2017-06-03 DIAGNOSIS — Z79899 Other long term (current) drug therapy: Secondary | ICD-10-CM | POA: Diagnosis not present

## 2017-06-03 DIAGNOSIS — I350 Nonrheumatic aortic (valve) stenosis: Secondary | ICD-10-CM | POA: Diagnosis not present

## 2017-06-03 DIAGNOSIS — N390 Urinary tract infection, site not specified: Secondary | ICD-10-CM | POA: Diagnosis not present

## 2017-06-03 DIAGNOSIS — I5032 Chronic diastolic (congestive) heart failure: Secondary | ICD-10-CM | POA: Diagnosis not present

## 2017-06-03 DIAGNOSIS — D649 Anemia, unspecified: Secondary | ICD-10-CM | POA: Diagnosis not present

## 2017-06-03 DIAGNOSIS — D638 Anemia in other chronic diseases classified elsewhere: Secondary | ICD-10-CM | POA: Diagnosis not present

## 2017-06-03 DIAGNOSIS — I872 Venous insufficiency (chronic) (peripheral): Secondary | ICD-10-CM | POA: Diagnosis not present

## 2017-06-03 DIAGNOSIS — R600 Localized edema: Secondary | ICD-10-CM | POA: Diagnosis not present

## 2017-06-03 DIAGNOSIS — S91104D Unspecified open wound of right lesser toe(s) without damage to nail, subsequent encounter: Secondary | ICD-10-CM | POA: Diagnosis not present

## 2017-06-03 DIAGNOSIS — E785 Hyperlipidemia, unspecified: Secondary | ICD-10-CM | POA: Diagnosis not present

## 2017-06-03 DIAGNOSIS — I219 Acute myocardial infarction, unspecified: Secondary | ICD-10-CM | POA: Diagnosis not present

## 2017-06-03 LAB — BASIC METABOLIC PANEL
Anion gap: 9 (ref 5–15)
BUN: 41 mg/dL — ABNORMAL HIGH (ref 6–20)
CO2: 16 mmol/L — ABNORMAL LOW (ref 22–32)
Calcium: 8.7 mg/dL — ABNORMAL LOW (ref 8.9–10.3)
Chloride: 115 mmol/L — ABNORMAL HIGH (ref 101–111)
Creatinine, Ser: 1.57 mg/dL — ABNORMAL HIGH (ref 0.44–1.00)
GFR calc Af Amer: 36 mL/min — ABNORMAL LOW (ref >60)
GFR calc non Af Amer: 31 mL/min — ABNORMAL LOW (ref >60)
Glucose, Bld: 247 mg/dL — ABNORMAL HIGH (ref 65–99)
Potassium: 4.8 mmol/L (ref 3.5–5.1)
Sodium: 140 mmol/L (ref 135–145)

## 2017-06-03 LAB — GLUCOSE, CAPILLARY
Glucose-Capillary: 199 mg/dL — ABNORMAL HIGH (ref 65–99)
Glucose-Capillary: 235 mg/dL — ABNORMAL HIGH (ref 65–99)
Glucose-Capillary: 235 mg/dL — ABNORMAL HIGH (ref 65–99)

## 2017-06-03 MED ORDER — COLLAGENASE 250 UNIT/GM EX OINT: TOPICAL_OINTMENT | Freq: Every day | CUTANEOUS | 0 refills | Status: AC

## 2017-06-03 MED ORDER — HYDROCODONE-ACETAMINOPHEN 5-325 MG PO TABS: 1.0000 | ORAL_TABLET | Freq: Four times a day (QID) | ORAL | 0 refills | Status: DC | PRN

## 2017-06-03 MED ORDER — SILVER SULFADIAZINE 1 % EX CREA: TOPICAL_CREAM | Freq: Every day | CUTANEOUS | 0 refills | Status: AC

## 2017-06-03 MED ORDER — DICLOFENAC SODIUM 1 % TD GEL: 2.0000 g | Freq: Four times a day (QID) | TRANSDERMAL | 0 refills | Status: AC

## 2017-06-03 MED ORDER — FUROSEMIDE 40 MG PO TABS: 20.0000 mg | ORAL_TABLET | Freq: Every day | ORAL | 0 refills | Status: DC | PRN

## 2017-06-03 NOTE — Clinical Social Work Placement (Signed)
   CLINICAL SOCIAL WORK PLACEMENT  NOTE  Date:  06/03/2017  Patient Details  Name: Emily Miranda MRN: [id number] Date of Birth: 06-07-1940  Clinical Social Work is seeking post-discharge placement for this patient at the Skilled  Nursing Facility level of care (*CSW will initial, date and re-position this form in  chart as items are completed):  Yes   Patient/family provided with [place] Clinical Social Work Department's list of facilities offering this level of care within the geographic area requested by the patient (or if unable, by the patient's family).  Yes   Patient/family informed of their freedom to choose among providers that offer the needed level of care, that participate in Medicare, Medicaid or managed care program needed by the patient, have an available bed and are willing to accept the patient.  Yes   Patient/family informed of [facility]'s ownership interest in [facility] and [facility], as well as of the fact that they are under no obligation to receive care at these facilities.  PASRR submitted to EDS on 06/02/17     PASRR number received on       Existing PASRR number confirmed on 06/02/17     FL2 transmitted to all facilities in geographic area requested by pt/family on 06/02/17     FL2 transmitted to all facilities within larger geographic area on       Patient informed that his/her managed care company has contracts with or will negotiate with certain facilities, including the following:        Yes   Patient/family informed of bed offers received.  Patient chooses bed at [facility]     Physician recommends and patient chooses bed at      Patient to be transferred to [facility] on 06/03/17.  Patient to be transferred to facility by [facility]     Patient family notified on 06/03/17 of transfer.  Name of family member notified:  Urban Gibson     PHYSICIAN       Additional Comment:     _______________________________________________ Margarito Liner, LCSW 06/03/2017, 4:59 PM

## 2017-06-03 NOTE — Clinical Social Work Note (Signed)
CSW facilitated patient discharge including contacting patient family and facility to confirm patient discharge plans. Clinical information faxed to facility and family agreeable with plan. CSW arranged ambulance transport via [facility] to [facility]. RN to call report prior to discharge [phone] Room [phone]).  CSW will sign off for now as social work intervention is no longer needed. Please consult [place] again if new needs arise.  Charlynn Court, CSW [phone]

## 2017-06-03 NOTE — Telephone Encounter (Signed)
I suggest that they speak with the hospital doctor who is caring for her. They can ask the nursing staff at the hospital to contact the hospital doctor for them to discuss her current treatment and plan of care for edema management and rehab potential and prognosis  Thanks Dr. Marylene Land

## 2017-06-03 NOTE — Progress Notes (Signed)
Pt called out for pain medication. When RN checked on pt, pt was sleeping. RN woke pt up to clarify need for pain medication.  Pt stated pain was 9/10 in her left leg. RN proceeded with PO pain medication per patients request. Vitals wnl, will continue to monitor.

## 2017-06-03 NOTE — Care Management Important Message (Signed)
Important Message  Patient Details  Name: Emily Miranda MRN: [id number] Date of Birth: 09/11/1940   Medicare Important Message Given:  Yes    Dorena Bodo 06/03/2017, 3:04 PM

## 2017-06-03 NOTE — Progress Notes (Signed)
PROGRESS NOTE    Emily Miranda  QVZ:[id number] DOB: Aug 07, 1940 DOA: 05/29/2017 PCP: Noni Saupe, MD    Brief Narrative:  77 year old female who presented with bilateral lower extremity erythema and edema. Patient does have significant past medical history forchronic lymphedema, hypertension, dyslipidemia, type 2 diabetes mellitus, neuropathy, Charcot feett, chronic anemia,chronic kidney disease stage III,and coronary artery disease.  Patient complained of worsening edema in both lower extremities, associated with pain and heaviness.On initial physical examination blood pressure 122/54, heart rate 52, respiratory rate 15, oxygen 100%. Moist mucous membranes, lungs clear to auscultation bilaterally, heart S1-S2 present and rhythmic, abdomen was soft nontender, positive lower extremity edema to 3+,positive areas of clear transudate with multiple ulcers.Sodium 134, potassium 5.4, chloride 110, bicarbonate 15, glucose 227, BUN 75, creatinine 2.50,white count 7.3, he will 9.0, hematocrit 20 positive, platelets 229.EKG: Normal sinus rhythm, positive PACs, 72 bpm, normal axis, normal intervals.  Patient was admitted to the hospital working diagnosis of stasis dermatitis rule out cellulitis, complicatedwith acute kidney injury chronic kidney disease with hyperkalemia.   Assessment & Plan:   Principal Problem:   Stasis dermatitis of both legs Active Problems:   Hypertensive cardiomyopathy, with heart failure (HCC)   Constipation, chronic   SUI (stress urinary incontinence, female)   CKD (chronic kidney disease), stage III (HCC)   Chronic diabetic ulcer of right foot    PVD (peripheral vascular disease) (HCC)   Charcot foot due to diabetes mellitus (HCC)   Chronic anemia   Chronic combined systolic and diastolic heart failure (HCC)   Edema   Cellulitis of lower extremity   Acute renal failure (ARF) (HCC)   1. Acute kidney injury on chronic disease stage III with  hyperkalemia. Patient was admitted to the medical ward, she was placed on a remote telemetry monitor, received intravenous IV fluids with isotonic saline, she had improvement of her kidney function, her discharge creatinine is back to its baseline, 1.57, patient received Kayexalate with improvement of potassium, discharge serum potassium 4.8, serum bicarbonate 16, chloride 115 and sodium 140.   2. Stasis dermatitis. Patient rule out for cellulitis antibiotics were discontinued. Patient was seen by wound care, and local wound care was performed. Patient does have joint deformities consistent with her Charcot feet. She was seen by physical therapy with recommendations for skilled nursing facility, for rehabilitation.  3. Hypertension. Blood pressure remained well-controlled, patient was continued on carvedilol, hydralazine and isosorbide.  4. Type 2 diabetes mellitus. Patient was placed on insulin sliding-scale. Glucose coverage and monitoring, she had hyperglycemia, her basal long-acting insulin was adjusted. Her fasting glucose on the day of discharge is 247.  5. Traumatic left hip pain. Radiographic evaluation was negative for fracture, pain control was achieved with local Voltaren and oral hydrocodone.   DVT prophylaxis: heparin  Code Status:  full Family Communication: no family at the bedside Disposition Plan: snf   Consultants:     Procedures:     Antimicrobials:       Subjective: Patient feeling better, left hip pain is controlled, no nausea or vomiting. Positive weakness and difficulty ambulating.   Objective: Vitals:   06/02/17 1948 06/02/17 2224 06/03/17 0448 06/03/17 1322  BP: 132/61 (!) 141/61 137/76 (!) 148/59  Pulse: 92  (!) 119 68  Resp: 18  18   Temp: 98.3 F (36.8 C)  98.2 F (36.8 C)   TempSrc: Oral  Oral   SpO2: 97%  98%   Weight:   64.8 kg (142 lb 13.7 oz)  Height:        Intake/Output Summary (Last 24 hours) at 06/03/2017 1522 Last data filed  at 06/03/2017 0942 Gross per 24 hour  Intake 477 ml  Output 1600 ml  Net -1123 ml   Filed Weights   06/01/17 0338 06/02/17 0500 06/03/17 0448  Weight: 62.9 kg (138 lb 10.7 oz) 65.7 kg (144 lb 13.5 oz) 64.8 kg (142 lb 13.7 oz)    Examination:   General: Not in pain or dyspnea, deconditioned Neurology: Awake and alert, non focal  E ENT: mild pallor, no icterus, oral mucosa moist Cardiovascular: No JVD. S1-S2 present, rhythmic, no gallops, rubs, or murmurs. Trace non pitting  lower extremity edema. Pulmonary: vesicular breath sounds bilaterally, adequate air movement, no wheezing, rhonchi or rales. Gastrointestinal. Abdomen flat, no organomegaly, non tender, no rebound or guarding Skin. Faint bilateral lower extremity erythema, superficial ulcers with dressing in place.  Musculoskeletal: bilateral ankle deformities.    Data Reviewed: I have personally reviewed following labs and imaging studies  CBC: Recent Labs  Lab 05/29/17 1205 05/30/17 0638  WBC 7.4 7.3  NEUTROABS 5.2  --   HGB 9.7* 9.0*  HCT 30.1* 28.0*  MCV 84.3 85.4  PLT 238 229   Basic Metabolic Panel: Recent Labs  Lab 05/30/17 0638 05/31/17 0919 06/01/17 0742 06/02/17 0440 06/03/17 0527  NA 135 137 137 139 140  K 5.9* 5.2* 5.0 5.0 4.8  CL 115* 119* 118* 118* 115*  CO2 15* 11* 13* 17* 16*  GLUCOSE 194* 229* 210* 228* 247*  BUN 67* 50* 42* 40* 41*  CREATININE 2.39* 1.76* 1.60* 1.52* 1.57*  CALCIUM 8.2* 8.0* 8.1* 8.2* 8.7*  MG 1.9  --   --   --   --   PHOS 3.5  --   --   --   --    GFR: Estimated Creatinine Clearance: 29 mL/min (A) (by C-G formula based on SCr of 1.57 mg/dL (H)). Liver Function Tests: Recent Labs  Lab 05/29/17 1205  AST 13*  ALT 15  ALKPHOS 107  BILITOT 0.4  PROT 5.9*  ALBUMIN 2.6*   No results for input(s): LIPASE, AMYLASE in the last 168 hours. No results for input(s): AMMONIA in the last 168 hours. Coagulation Profile: No results for input(s): INR, PROTIME in the last 168  hours. Cardiac Enzymes: Recent Labs  Lab 05/29/17 1835  TROPONINI 0.04*   BNP (last 3 results) No results for input(s): PROBNP in the last 8760 hours. HbA1C: No results for input(s): HGBA1C in the last 72 hours. CBG: Recent Labs  Lab 06/02/17 1135 06/02/17 1624 06/02/17 2056 06/03/17 0727 06/03/17 1157  GLUCAP 181* 182* 265* 199* 235*   Lipid Profile: No results for input(s): CHOL, HDL, LDLCALC, TRIG, CHOLHDL, LDLDIRECT in the last 72 hours. Thyroid Function Tests: No results for input(s): TSH, T4TOTAL, FREET4, T3FREE, THYROIDAB in the last 72 hours. Anemia Panel: No results for input(s): VITAMINB12, FOLATE, FERRITIN, TIBC, IRON, RETICCTPCT in the last 72 hours.    Radiology Studies: I have reviewed all of the imaging during this hospital visit personally     Scheduled Meds: . acetaminophen  650 mg Oral TID  . atorvastatin  40 mg Oral q1800  . carvedilol  6.25 mg Oral BID WC  . clopidogrel  75 mg Oral Q breakfast  . collagenase   Topical Daily  . cyclobenzaprine  10 mg Oral TID  . diclofenac sodium  2 g Topical QID  . hydrALAZINE  25 mg Oral Q8H  .  insulin aspart  0-9 Units Subcutaneous TID WC  . insulin glargine  30 Units Subcutaneous QHS  . isosorbide mononitrate  30 mg Oral Daily  . mupirocin cream  1 application Topical BID  . pantoprazole  40 mg Oral Daily  . polyethylene glycol  17 g Oral BID  . silver sulfADIAZINE   Topical Daily   Continuous Infusions:   LOS: 5 days        Mauricio Annett Gula, MD [facility] Pager [phone]

## 2017-06-03 NOTE — Discharge Summary (Signed)
Physician Discharge Summary  CHRISTALLE BARRER YEB:[id number] DOB: 12/04/1940 DOA: 05/29/2017  PCP: Noni Saupe, MD  Admit date: 05/29/2017 Discharge date: 06/03/2017  Admitted From: Home Disposition:  SNF  Recommendations for Outpatient Follow-up:  1. Follow up with PCP in 1 week 2. Use furosemide only as needed 3. Discontinue Aldactone 4. Follow-up BMP in 7 days  Home Health: No  Equipment/Devices: Na   Discharge Condition: Stable CODE STATUS: full  Diet recommendation: Heart healthy and diabetic prudent.   Brief/Interim Summary: 77 year old female who presented with bilateral lower extremity erythema and edema. Patient does have significant past medical history forchronic lymphedema, hypertension, dyslipidemia, type 2 diabetes mellitus, neuropathy, Charcot feett, chronic anemia,chronic kidney disease stage III,and coronary artery disease.  Patient complained of worsening edema in both lower extremities, associated with pain and heaviness.On initial physical examination blood pressure 122/54, heart rate 52, respiratory rate 15, oxygen 100%. Moist mucous membranes, lungs clear to auscultation bilaterally, heart S1-S2 present and rhythmic, abdomen was soft nontender, positive lower extremity edema to 3+,positive areas of clear transudate with multiple ulcers.Sodium 134, potassium 5.4, chloride 110, bicarbonate 15, glucose 227, BUN 75, creatinine 2.50,white count 7.3, he will 9.0, hematocrit 20 positive, platelets 229.EKG: Normal sinus rhythm, positive PACs, 72 bpm, normal axis, normal intervals.  Patient was admitted to the hospital working diagnosis of stasis dermatitis rule out cellulitis, complicatedwith acute kidney injury chronic kidney disease with hyperkalemia.  1. Acute kidney injury on chronic disease stage III with hyperkalemia. Patient was admitted to the medical ward, she was placed on a remote telemetry monitor, received intravenous IV fluids with isotonic  saline, she had improvement of her kidney function, her discharge creatinine is back to its baseline, 1.57, patient received Kayexalate with improvement of potassium, discharge serum potassium 4.8, serum bicarbonate 16, chloride 115 and sodium 140.   2. Stasis dermatitis. Patient rule out for cellulitis antibiotics were discontinued. Patient was seen by wound care, and local wound care was performed. Patient does have joint deformities consistent with her Charcot feet. She was seen by physical therapy with recommendations for skilled nursing facility, for rehabilitation.  3. Hypertension. Blood pressure remained well-controlled, patient was continued on carvedilol, hydralazine and isosorbide.  4. Type 2 diabetes mellitus. Patient was placed on insulin sliding-scale. Glucose coverage and monitoring, she had hyperglycemia, her basal long-acting insulin was adjusted. Her fasting glucose on the day of discharge is 247.  5. Traumatic left hip pain. Radiographic evaluation was negative for fracture, pain control was achieved with local Voltaren and oral hydrocodone.   6. Diastolic heart failure ejection fraction 40-45%, chronic and stable with no exacerbation. Patient follows up with the outpatient cardiology clinic, on the last time she was seen her symptoms were attributed mainly to venous insufficiency. Will resume furosemide only as needed, will discontinue Aldactone.     Discharge Diagnoses:  Principal Problem:   Stasis dermatitis of both legs Active Problems:   Hypertensive cardiomyopathy, with heart failure (HCC)   Constipation, chronic   SUI (stress urinary incontinence, female)   CKD (chronic kidney disease), stage III (HCC)   Chronic diabetic ulcer of right foot    PVD (peripheral vascular disease) (HCC)   Charcot foot due to diabetes mellitus (HCC)   Chronic anemia   Chronic combined systolic and diastolic heart failure (HCC)   Edema   Cellulitis of lower extremity   Acute renal  failure (ARF) (HCC)    Discharge Instructions   Allergies as of 06/03/2017  Reactions   Levofloxacin Nausea And Vomiting, Other (See Comments)   Confusion   Amlodipine Other (See Comments)   Localized edema   Gabapentin Other (See Comments)   Disoriented, no strength in legs   Hydrochlorothiazide Other (See Comments)   Hot and disoriented   Ibuprofen Other (See Comments)   Affected kidneys - stopped flow of urine Reaction to Advil   Metformin And Related Other (See Comments)   Affected kidneys - stopped flow of urine   Simvastatin Other (See Comments)   Chest pain      Medication List    STOP taking these medications   aspirin 81 MG EC tablet   cephALEXin 500 MG capsule Commonly known as:  KEFLEX   metolazone 2.5 MG tablet Commonly known as:  ZAROXOLYN   spironolactone 25 MG tablet Commonly known as:  ALDACTONE   traMADol 50 MG tablet Commonly known as:  ULTRAM     TAKE these medications   aspirin-acetaminophen-caffeine 250-250-65 MG tablet Commonly known as:  EXCEDRIN MIGRAINE Take 1 tablet by mouth every 6 (six) hours as needed for headache.   atorvastatin 40 MG tablet Commonly known as:  LIPITOR Take 1 tablet (40 mg total) by mouth daily at 6 PM.   carvedilol 6.25 MG tablet Commonly known as:  COREG Take 6.25 mg by mouth 2 (two) times daily with a meal.   clopidogrel 75 MG tablet Commonly known as:  PLAVIX Take 1 tablet (75 mg total) by mouth daily with breakfast.   collagenase ointment Commonly known as:  SANTYL Apply topically daily. Apply to LE plantar surface daily. Start taking on:  06/04/2017   cyclobenzaprine 5 MG tablet Commonly known as:  FLEXERIL Take 10 mg by mouth 3 (three) times daily.   diclofenac sodium 1 % Gel Commonly known as:  VOLTAREN Apply 2 g topically 4 (four) times daily. Please apply to let hip.   fexofenadine 180 MG tablet Commonly known as:  ALLEGRA Take 180 mg by mouth daily.   fluticasone 50 MCG/ACT nasal  spray Commonly known as:  FLONASE Place 1 spray into both nostrils daily as needed (seasonal allergies).   furosemide 40 MG tablet Commonly known as:  LASIX Take 0.5 tablets (20 mg total) by mouth daily as needed for fluid (wieght gain 2lbs in 24 hours, or 5 lbs in 7 days.). What changed:    when to take this  reasons to take this   glimepiride 2 MG tablet Commonly known as:  AMARYL Take 4 mg by mouth daily with breakfast. What changed:  Another medication with the same name was removed. Continue taking this medication, and follow the directions you see here.   hydrALAZINE 25 MG tablet Commonly known as:  APRESOLINE Take 25 mg by mouth every 8 (eight) hours.   HYDROcodone-acetaminophen 5-325 MG tablet Commonly known as:  NORCO/VICODIN Take 1 tablet by mouth every 6 (six) hours as needed for moderate pain.   isosorbide mononitrate 30 MG 24 hr tablet Commonly known as:  IMDUR Take 1 tablet (30 mg total) by mouth daily.   LANTUS SOLOSTAR 100 UNIT/ML Solostar Pen Generic drug:  Insulin Glargine Inject 30 Units into the skin at bedtime.   linagliptin 5 MG Tabs tablet Commonly known as:  TRADJENTA Take 5 mg by mouth daily.   lisinopril 30 MG tablet Commonly known as:  PRINIVIL,ZESTRIL Take 30 mg by mouth daily.   mupirocin cream 2 % Commonly known as:  BACTROBAN Apply 1 application topically 2 (two) times daily.  nitroGLYCERIN 0.4 MG SL tablet Commonly known as:  NITROSTAT Place 1 tablet (0.4 mg total) under the tongue every 5 (five) minutes x 3 doses as needed for chest pain.   pantoprazole 40 MG tablet Commonly known as:  PROTONIX Take 1 tablet (40 mg total) by mouth daily.   polyethylene glycol packet Commonly known as:  MIRALAX / GLYCOLAX Take 17 g by mouth daily as needed (constipation). Mix in 8 oz liquid and drink   silver sulfADIAZINE 1 % cream Commonly known as:  SILVADENE Apply topically daily. Apply to right lower extremities affected areas, not to  the plantar region Start taking on:  06/04/2017   SYSTANE OP Place 1 drop into both eyes daily as needed (dry eyes).   vitamin C 1000 MG tablet Take 1,000 mg by mouth daily.      Follow-up Information    Noni Saupe, MD.   Specialty:  [facility] Medicine Contact information: [address] [place] [address] [phone]          Allergies  Allergen Reactions  . Levofloxacin Nausea And Vomiting and Other (See Comments)    Confusion  . Amlodipine Other (See Comments)    Localized edema  . Gabapentin Other (See Comments)    Disoriented, no strength in legs  . Hydrochlorothiazide Other (See Comments)    Hot and disoriented  . Ibuprofen Other (See Comments)    Affected kidneys - stopped flow of urine Reaction to Advil  . Metformin And Related Other (See Comments)    Affected kidneys - stopped flow of urine  . Simvastatin Other (See Comments)    Chest pain    Consultations:     Procedures/Studies: Dg Hip Unilat With Pelvis 2-3 Views Left  Result Date: 05/29/2017 CLINICAL DATA:  Left hip pain after fall 2 days ago. EXAM: DG HIP (WITH OR WITHOUT PELVIS) 2-3V LEFT COMPARISON:  CT abdomen pelvis dated December 02, 2016. Abdominal x-rays dated January 09, 2008. FINDINGS: No acute fracture or malalignment. Stable mild-to-moderate left hip joint space narrowing. The sacroiliac joints and pubic symphysis are intact. No focal bony abnormality. Degenerative changes of the lumbar spine. Osteopenia. Prior ventral abdominal wall hernia repair. IMPRESSION: 1. No acute osseous abnormality. If occult hip fracture is suspected or if the patient is unable to bear weight, MRI is the preferred modality for further evaluation. 2. Relatively unchanged mild to moderate left hip osteoarthritis. Electronically Signed   By: Obie Dredge M.D.   On: 05/29/2017 15:45       Subjective: Patient feeling better, left hip pain is controlled, no nausea or vomiting. Positive weakness and  difficulty ambulating.   Discharge Exam: Vitals:   06/02/17 2224 06/03/17 0448  BP: (!) 141/61 137/76  Pulse:  (!) 119  Resp:  18  Temp:  98.2 F (36.8 C)  SpO2:  98%   Vitals:   06/02/17 1344 06/02/17 1948 06/02/17 2224 06/03/17 0448  BP: 136/62 132/61 (!) 141/61 137/76  Pulse:  92  (!) 119  Resp:  18  18  Temp:  98.3 F (36.8 C)  98.2 F (36.8 C)  TempSrc:  Oral  Oral  SpO2:  97%  98%  Weight:    64.8 kg (142 lb 13.7 oz)  Height:        General: Not in pain or dyspnea, deconditioned Neurology: Awake and alert, non focal  E ENT: mild pallor, no icterus, oral mucosa moist Cardiovascular: No JVD. S1-S2 present, rhythmic, no gallops, rubs, or  murmurs. Trace non pitting  lower extremity edema. Pulmonary: vesicular breath sounds bilaterally, adequate air movement, no wheezing, rhonchi or rales. Gastrointestinal. Abdomen flat, no organomegaly, non tender, no rebound or guarding Skin. Faint bilateral lower extremity erythema, superficial ulcers with dressing in place.  Musculoskeletal: bilateral ankle deformities.    The results of significant diagnostics from this hospitalization (including imaging, microbiology, ancillary and laboratory) are listed below for reference.     Microbiology: No results found for this or any previous visit (from the past 240 hour(s)).   Labs: BNP (last 3 results) Recent Labs    07/29/16 1110 08/27/16 1514 11/25/16 1932  BNP 2,605.1* 409.2* 2,713.6*   Basic Metabolic Panel: Recent Labs  Lab 05/30/17 1610 05/31/17 0919 06/01/17 0742 06/02/17 0440 06/03/17 0527  NA 135 137 137 139 140  K 5.9* 5.2* 5.0 5.0 4.8  CL 115* 119* 118* 118* 115*  CO2 15* 11* 13* 17* 16*  GLUCOSE 194* 229* 210* 228* 247*  BUN 67* 50* 42* 40* 41*  CREATININE 2.39* 1.76* 1.60* 1.52* 1.57*  CALCIUM 8.2* 8.0* 8.1* 8.2* 8.7*  MG 1.9  --   --   --   --   PHOS 3.5  --   --   --   --    Liver Function Tests: Recent Labs  Lab 05/29/17 1205  AST 13*  ALT 15   ALKPHOS 107  BILITOT 0.4  PROT 5.9*  ALBUMIN 2.6*   No results for input(s): LIPASE, AMYLASE in the last 168 hours. No results for input(s): AMMONIA in the last 168 hours. CBC: Recent Labs  Lab 05/29/17 1205 05/30/17 0638  WBC 7.4 7.3  NEUTROABS 5.2  --   HGB 9.7* 9.0*  HCT 30.1* 28.0*  MCV 84.3 85.4  PLT 238 229   Cardiac Enzymes: Recent Labs  Lab 05/29/17 1835  TROPONINI 0.04*   BNP: Invalid input(s): POCBNP CBG: Recent Labs  Lab 06/02/17 0715 06/02/17 1135 06/02/17 1624 06/02/17 2056 06/03/17 0727  GLUCAP 156* 181* 182* 265* 199*   D-Dimer No results for input(s): DDIMER in the last 72 hours. Hgb A1c No results for input(s): HGBA1C in the last 72 hours. Lipid Profile No results for input(s): CHOL, HDL, LDLCALC, TRIG, CHOLHDL, LDLDIRECT in the last 72 hours. Thyroid function studies No results for input(s): TSH, T4TOTAL, T3FREE, THYROIDAB in the last 72 hours.  Invalid input(s): FREET3 Anemia work up No results for input(s): VITAMINB12, FOLATE, FERRITIN, TIBC, IRON, RETICCTPCT in the last 72 hours. Urinalysis    Component Value Date/Time   COLORURINE YELLOW 07/29/2016 1247   APPEARANCEUR HAZY (A) 07/29/2016 1247   LABSPEC 1.016 07/29/2016 1247   PHURINE 5.0 07/29/2016 1247   GLUCOSEU >=500 (A) 07/29/2016 1247   HGBUR NEGATIVE 07/29/2016 1247   BILIRUBINUR NEGATIVE 07/29/2016 1247   KETONESUR NEGATIVE 07/29/2016 1247   PROTEINUR >=300 (A) 07/29/2016 1247   UROBILINOGEN 0.2 03/03/2007 1337   NITRITE NEGATIVE 07/29/2016 1247   LEUKOCYTESUR NEGATIVE 07/29/2016 1247   Sepsis Labs Invalid input(s): PROCALCITONIN,  WBC,  LACTICIDVEN Microbiology No results found for this or any previous visit (from the past 240 hour(s)).   Time coordinating discharge: 45 minutes  SIGNED:   Coralie Keens, MD  [facility] 06/03/2017, 9:29 AM Pager [phone]  If 7PM-7AM, please contact night-coverage [email or web address] Password [email or web address]

## 2017-06-03 NOTE — Progress Notes (Signed)
Physical Therapy Treatment Patient Details Name: Emily Miranda MRN: [id number] DOB: 1941-04-20 Today's Date: 06/03/2017    History of Present Illness Pt is a 77 y.o. female admitted 05/29/17 with BLE edema, redness, and multiple weeping ulcers; worked up for stasis dermatitis with possible overlying cellulitis. Also c/o worsening L hip pain; x-ray clear, but no MRI yet. PMH includes RLE lymphedema with Charcot foot, CAD, HLD, DM, HF, acute on chronic CKD.    PT Comments    Pt required max encouragement to participate in therapy this am.  Pt performed sitting edge of bed,performing seated exercises edge of bed and dynamic reaching activities edge of bed.  Pt did agree to brief standing trial but remains limited due to pain in L hip.  Pt continues to benefit from skilled rehab in a post acute setting to improve strength and function before return home.  Continue PT per POC to improve strength and tolerance to activity.    Follow Up Recommendations  SNF     Equipment Recommendations  (TBD at next venue)    Recommendations for Other Services       Precautions / Restrictions Precautions Precautions: Fall Precaution Comments: BLE weeping ulcers; R charcot foot deformity w/ lateral deviation Restrictions Weight Bearing Restrictions: No Other Position/Activity Restrictions: No weight bearing restriction in orders.      Mobility  Bed Mobility Overal bed mobility: Needs Assistance Bed Mobility: Rolling Rolling: Min assist;+2 for physical assistance         General bed mobility comments: Pt performed sitting edge of bed with assistance to advance B LEs and elevate trunk into sitting.  Limited due to pain.  Mod assist +1 to scoot forward with use of chux pad.    Transfers Overall transfer level: Needs assistance Equipment used: 2 person hand held assist(B HHA ) Transfers: Sit to/from Stand Sit to Stand: Mod assist;+2 physical assistance         General transfer comment: Pt  performed brief trial of sit to stand with max VCs for technique.  Able to stand erect x 10 sec before returning to seated position.    Ambulation/Gait                 Stairs            Wheelchair Mobility    Modified Rankin (Stroke Patients Only)       Balance Overall balance assessment: Needs assistance   Sitting balance-Leahy Scale: Good Sitting balance - Comments: Pt performed reaching tasks edge of bed crossing midline and reaching outside BOS, patient fatigued with activity.       Standing balance-Leahy Scale: Poor                              Cognition Arousal/Alertness: Awake/alert Behavior During Therapy: WFL for tasks assessed/performed Overall Cognitive Status: Within Functional Limits for tasks assessed                                        Exercises General Exercises - Lower Extremity Long Arc Quad: AROM;Both;10 reps;Seated Hip Flexion/Marching: AROM;Both;10 reps;Seated    General Comments        Pertinent Vitals/Pain Pain Assessment: Faces Faces Pain Scale: Hurts whole lot Pain Location: BLE with movement (L>R) Pain Descriptors / Indicators: Aching;Shooting;Grimacing;Moaning Pain Intervention(s): Monitored during session;Repositioned(Pt refused ice pack)  Home Living                      Prior Function            PT Goals (current goals can now be found in the care plan section) Acute Rehab PT Goals Patient Stated Goal: Decreased pain, then get stronger at rehab Potential to Achieve Goals: Good Progress towards PT goals: Progressing toward goals    Frequency    Min 2X/week      PT Plan Current plan remains appropriate    Co-evaluation              AM-PAC PT "6 Clicks" Daily Activity  Outcome Measure  Difficulty turning over in bed (including adjusting bedclothes, sheets and blankets)?: Unable Difficulty moving from lying on back to sitting on the side of the bed? :  Unable Difficulty sitting down on and standing up from a chair with arms (e.g., wheelchair, bedside commode, etc,.)?: Unable Help needed moving to and from a bed to chair (including a wheelchair)?: A Lot Help needed walking in hospital room?: Total Help needed climbing 3-5 steps with a railing? : Total 6 Click Score: 7    End of Session Equipment Utilized During Treatment: Gait belt Activity Tolerance: Patient limited by pain Patient left: in bed;with call bell/phone within reach;with nursing/sitter in room;with bed alarm set Nurse Communication: Mobility status;Need for lift equipment PT Visit Diagnosis: Other abnormalities of gait and mobility (R26.89);Pain Pain - Right/Left: (Both L>R) Pain - part of body: Leg;Hip     Time: 1610-9604 PT Time Calculation (min) (ACUTE ONLY): 17 min  Charges:  $Therapeutic Activity: 8-22 mins                    G Codes:       Joycelyn Rua, PTA pager [phone]    Florestine Avers 06/03/2017, 4:15 PM

## 2017-06-03 NOTE — Telephone Encounter (Signed)
Emily Miranda states pt's husband hasn't talked to a doctor at all and she just found out they are transporting pt to [facility] in [place]. I asked if she could give me pt's husband's phone and I would call to see if I could help explain what the rehab center may be able to do for pt.

## 2017-06-03 NOTE — Telephone Encounter (Signed)
I spoke with pt's husband, Jonny Ruiz, he stated [facility] had done this and that for pt and fixed the wounds a time or two, but nothing he hadn't done at home. I told him I had spoken with Dr. Marylene Land and she had said rehab could help her with the ADL, wound care and in some cases lymphedema therapy, but the lymphedema was a systemic problem that now was past podiatric treatment. I told [name] to keep [place] in touch with pt's progress.

## 2017-06-03 NOTE — Telephone Encounter (Signed)
Thank you :)

## 2017-06-03 NOTE — Telephone Encounter (Signed)
My name is Tommy Medal and I'm a sister to Emily Miranda who is in [facility] on Dr. [name] request. She has been there since last Thursday. My brother-in-law has been up there every day and has not been able to talk to a doctor about her prognosis. They are trying to get her into a nursing home which is not a skilled nursing home today for rehabilitation. The last time that she went to this place the guy who is in charge of rehabilitation said that she was not able to have rehabilitation and they haven't done anything about the fluids in her legs or anything, she has just been laying there. I don't know why she has been through this or why Dr. Marylene Land hasn't heard anything as she is the one who recommended her going there. So please call me back at [phone]. Thank you.

## 2017-06-03 NOTE — Clinical Social Work Note (Addendum)
CSW notified patient that [facility] has extended a bed offer. [facility] is first preference. Their decision is still pending. CSW left a voicemail for admissions coordinator.  Charlynn Court, CSW [phone]  1:31 pm CSW spoke with [facility] admissions coordinator. They are unable to offer on patient. Patient and her family notified at bedside. Patient is agreeable to [facility]. CSW notified clinical liaison for the facility. She will start insurance authorization.  Charlynn Court, CSW [phone]  4:35 pm Insurance authorization approved. SNF can accept patient today. MD aware and will start working on orders.  Charlynn Court, CSW [phone]

## 2017-06-04 ENCOUNTER — Other Ambulatory Visit: Payer: Self-pay

## 2017-06-09 ENCOUNTER — Other Ambulatory Visit: Payer: Self-pay | Admitting: *Deleted

## 2017-06-09 DIAGNOSIS — R262 Difficulty in walking, not elsewhere classified: Secondary | ICD-10-CM | POA: Diagnosis not present

## 2017-06-09 NOTE — Patient Outreach (Signed)
[facility]) Care Management  06/09/2017  Emily Miranda 18-Sep-1940 [id number]   CSW made contact with patiens and her husband today by phone. Per husband, patient is at [facility] rehab getting therapy with an expected dc next week. "she has to be a [facility] hospital next Wednesday for her legs".  CSW has also left a message for SNF rep for updates.  CSW will plan follow up call/visit later this week for further assessment and needs assessment.   Reece Levy, MSW, LCSW Clinical Social Worker  [facility] [employer] [phone]

## 2017-06-12 ENCOUNTER — Other Ambulatory Visit: Payer: Self-pay | Admitting: *Deleted

## 2017-06-12 NOTE — Patient Outreach (Signed)
[facility]) Care Management  06/12/2017  Emily Miranda 1940/06/20 [id number]   CSW met with patient at [facility] rehab. She is in good spirits and is anticipating a dc home on next Tuesday.  She plans to go to the wound clinic at [facility] the foillowing day and is uncertain if she will be staying overnight or going home.  CSW discussed home needs; she reports her husband takes care of everything and there are no concerns or needs.  CSW discussed probable HH needs at home to which she shared she has had and ended because there were no open wounds.  CSW communicating with SNF staff as well and will call for update on Monday prior to plans for dc on Tuesday.    [facility] CM Care Plan Problem One     Most Recent Value  Care Plan Problem One  Patient admitted to SNF rehab.  Role Documenting the Problem One  Clinical Social Worker  Care Plan for Problem One  Active  [facility] Long Term Goal   Patient will partiicipate with PT, OT and RN daily at SNF   [place] Term Goal Start Date  06/09/17  Interventions for Problem One Long Term Goal  CSW coordinating with patient, family and SNF.        Eduard Clos, MSW, [place] Worker  [place] [phone]

## 2017-06-16 ENCOUNTER — Ambulatory Visit: Payer: Self-pay | Admitting: *Deleted

## 2017-06-18 DIAGNOSIS — E11610 Type 2 diabetes mellitus with diabetic neuropathic arthropathy: Secondary | ICD-10-CM | POA: Diagnosis not present

## 2017-06-18 DIAGNOSIS — R600 Localized edema: Secondary | ICD-10-CM | POA: Diagnosis not present

## 2017-06-18 DIAGNOSIS — I890 Lymphedema, not elsewhere classified: Secondary | ICD-10-CM | POA: Diagnosis not present

## 2017-06-18 DIAGNOSIS — L859 Epidermal thickening, unspecified: Secondary | ICD-10-CM | POA: Diagnosis not present

## 2017-06-18 DIAGNOSIS — L97819 Non-pressure chronic ulcer of other part of right lower leg with unspecified severity: Secondary | ICD-10-CM | POA: Diagnosis not present

## 2017-06-18 DIAGNOSIS — E11621 Type 2 diabetes mellitus with foot ulcer: Secondary | ICD-10-CM | POA: Diagnosis not present

## 2017-06-18 DIAGNOSIS — I87331 Chronic venous hypertension (idiopathic) with ulcer and inflammation of right lower extremity: Secondary | ICD-10-CM | POA: Diagnosis not present

## 2017-06-18 DIAGNOSIS — B353 Tinea pedis: Secondary | ICD-10-CM | POA: Diagnosis not present

## 2017-06-18 DIAGNOSIS — L97415 Non-pressure chronic ulcer of right heel and midfoot with muscle involvement without evidence of necrosis: Secondary | ICD-10-CM | POA: Diagnosis not present

## 2017-06-18 DIAGNOSIS — E1142 Type 2 diabetes mellitus with diabetic polyneuropathy: Secondary | ICD-10-CM | POA: Diagnosis not present

## 2017-06-19 ENCOUNTER — Ambulatory Visit: Payer: Self-pay | Admitting: *Deleted

## 2017-06-19 ENCOUNTER — Telehealth: Payer: Self-pay | Admitting: Sports Medicine

## 2017-06-19 ENCOUNTER — Telehealth: Payer: Self-pay | Admitting: *Deleted

## 2017-06-19 NOTE — Telephone Encounter (Signed)
Val Can you resend referral to [facility] Thanks Dr. Marylene Land

## 2017-06-19 NOTE — Telephone Encounter (Signed)
Thank you :)

## 2017-06-19 NOTE — Telephone Encounter (Signed)
Laurena Bering - [facility] called stating [facility] referred to [facility] for unna boots, and she wanted to know if pt was still under the care of [facility]. I told Selena Batten, after referring pt to [facility], pt went in to the hospital, so that referral had expired, so [facility]'s referral to [facility] would be valid.

## 2017-06-19 NOTE — Telephone Encounter (Signed)
Pt called about wound care [facility] needs to get another referral. [facility] Dr Vaughan Basta [phone] [facility]

## 2017-06-20 NOTE — Telephone Encounter (Signed)
Maybe the patient has the name wrong but yes keep her appt for 07-14-17 -Dr Kathie Rhodes

## 2017-06-23 DIAGNOSIS — E11621 Type 2 diabetes mellitus with foot ulcer: Secondary | ICD-10-CM | POA: Diagnosis not present

## 2017-06-23 DIAGNOSIS — E11610 Type 2 diabetes mellitus with diabetic neuropathic arthropathy: Secondary | ICD-10-CM | POA: Diagnosis not present

## 2017-06-23 DIAGNOSIS — Z794 Long term (current) use of insulin: Secondary | ICD-10-CM | POA: Diagnosis not present

## 2017-06-23 DIAGNOSIS — L97415 Non-pressure chronic ulcer of right heel and midfoot with muscle involvement without evidence of necrosis: Secondary | ICD-10-CM | POA: Diagnosis not present

## 2017-06-23 DIAGNOSIS — R600 Localized edema: Secondary | ICD-10-CM | POA: Diagnosis not present

## 2017-06-24 ENCOUNTER — Encounter: Payer: Self-pay | Admitting: *Deleted

## 2017-06-24 ENCOUNTER — Other Ambulatory Visit: Payer: Self-pay | Admitting: *Deleted

## 2017-06-24 NOTE — Patient Outreach (Signed)
[facility]) Care Management  [facility] Social Work  06/24/2017  Emily Miranda 1940/11/26 [id number]  Subjective:  "I am home and doing well".  Objective: [facility] CSW to assist patient and family with community based resources to aide in their well-being, quality of life and overall safety and needs.    Encounter Medications:  Outpatient Encounter Medications as of 06/24/2017  Medication Sig  . Ascorbic Acid (VITAMIN C) 1000 MG tablet Take 1,000 mg by mouth daily.  Marland Kitchen aspirin-acetaminophen-caffeine (EXCEDRIN MIGRAINE) 250-250-65 MG tablet Take 1 tablet by mouth every 6 (six) hours as needed for headache.  Marland Kitchen atorvastatin (LIPITOR) 40 MG tablet Take 1 tablet (40 mg total) by mouth daily at 6 PM.  . carvedilol (COREG) 6.25 MG tablet Take 6.25 mg by mouth 2 (two) times daily with a meal.  . clopidogrel (PLAVIX) 75 MG tablet Take 1 tablet (75 mg total) by mouth daily with breakfast.  . collagenase (SANTYL) ointment Apply topically daily. Apply to LE plantar surface daily.  . cyclobenzaprine (FLEXERIL) 5 MG tablet Take 10 mg by mouth 3 (three) times daily.   . diclofenac sodium (VOLTAREN) 1 % GEL Apply 2 g topically 4 (four) times daily. Please apply to let hip.  . fexofenadine (ALLEGRA) 180 MG tablet Take 180 mg by mouth daily.  . fluticasone (FLONASE) 50 MCG/ACT nasal spray Place 1 spray into both nostrils daily as needed (seasonal allergies).   . furosemide (LASIX) 40 MG tablet Take 0.5 tablets (20 mg total) by mouth daily as needed for fluid (wieght gain 2lbs in 24 hours, or 5 lbs in 7 days.).  Marland Kitchen glimepiride (AMARYL) 2 MG tablet Take 4 mg by mouth daily with breakfast.   . hydrALAZINE (APRESOLINE) 25 MG tablet Take 25 mg by mouth every 8 (eight) hours.  Marland Kitchen HYDROcodone-acetaminophen (NORCO/VICODIN) 5-325 MG tablet Take 1 tablet by mouth every 6 (six) hours as needed for moderate pain.  . isosorbide mononitrate (IMDUR) 30 MG 24 hr tablet Take 1 tablet (30 mg total) by mouth daily.   Marland Kitchen LANTUS SOLOSTAR 100 UNIT/ML Solostar Pen Inject 30 Units into the skin at bedtime.  Marland Kitchen linagliptin (TRADJENTA) 5 MG TABS tablet Take 5 mg by mouth daily.  Marland Kitchen lisinopril (PRINIVIL,ZESTRIL) 30 MG tablet Take 30 mg by mouth daily.  . mupirocin cream (BACTROBAN) 2 % Apply 1 application topically 2 (two) times daily.  . nitroGLYCERIN (NITROSTAT) 0.4 MG SL tablet Place 1 tablet (0.4 mg total) under the tongue every 5 (five) minutes x 3 doses as needed for chest pain.  . pantoprazole (PROTONIX) 40 MG tablet Take 1 tablet (40 mg total) by mouth daily.  Bertram Gala Glycol-Propyl Glycol (SYSTANE OP) Place 1 drop into both eyes daily as needed (dry eyes).  . polyethylene glycol (MIRALAX / GLYCOLAX) packet Take 17 g by mouth daily as needed (constipation). Mix in 8 oz liquid and drink  . silver sulfADIAZINE (SILVADENE) 1 % cream Apply topically daily. Apply to right lower extremities affected areas, not to the plantar region   No facility-administered encounter medications on file as of 06/24/2017.     Functional Status:  In your present state of health, do you have any difficulty performing the following activities: 05/30/2017 11/21/2016  Hearing? N N  Vision? N N  Difficulty concentrating or making decisions? N Y  Walking or climbing stairs? Y Y  Comment - -  Dressing or bathing? N Y  Comment - -  Doing errands, shopping? [name]  Quarry manager and  eating ? - Y  Using the Toilet? - Y  Do you have problems with loss of bowel control? - N  Managing your Medications? - N  Managing your Finances? - N  Housekeeping or managing your Housekeeping? - N  Some recent data might be hidden    Fall/Depression Screening:  PHQ 2/9 Scores 12/18/2016 11/21/2016 11/13/2015  PHQ - 2 Score 0 0 0    Assessment:  CSW spoke with patient and her husband by phone today. She is connected with [facility] wound care clinic and reports they are arranging for [facility] services as well.  Patient and husband report no needs or concerns at  this time and decline CSW visit for assessment/support.  "my son and my husband are here and take care of it all".  CSW will plan case closure and advise [facility] team and PCP.     Plan:  CSW plans case closure at this time as patient and spouse decline needs or interest at this time.   Reece Levy, MSW, LCSW Clinical Social Worker  [facility] [employer] [phone]

## 2017-06-26 DIAGNOSIS — E1129 Type 2 diabetes mellitus with other diabetic kidney complication: Secondary | ICD-10-CM | POA: Diagnosis not present

## 2017-06-26 DIAGNOSIS — I872 Venous insufficiency (chronic) (peripheral): Secondary | ICD-10-CM | POA: Diagnosis not present

## 2017-06-26 DIAGNOSIS — R600 Localized edema: Secondary | ICD-10-CM | POA: Diagnosis not present

## 2017-06-26 DIAGNOSIS — E11621 Type 2 diabetes mellitus with foot ulcer: Secondary | ICD-10-CM | POA: Diagnosis not present

## 2017-06-26 DIAGNOSIS — E11610 Type 2 diabetes mellitus with diabetic neuropathic arthropathy: Secondary | ICD-10-CM | POA: Diagnosis not present

## 2017-06-27 DIAGNOSIS — I5042 Chronic combined systolic (congestive) and diastolic (congestive) heart failure: Secondary | ICD-10-CM | POA: Diagnosis not present

## 2017-06-27 DIAGNOSIS — D631 Anemia in chronic kidney disease: Secondary | ICD-10-CM | POA: Diagnosis not present

## 2017-06-27 DIAGNOSIS — Z9181 History of falling: Secondary | ICD-10-CM | POA: Diagnosis not present

## 2017-06-27 DIAGNOSIS — E1151 Type 2 diabetes mellitus with diabetic peripheral angiopathy without gangrene: Secondary | ICD-10-CM | POA: Diagnosis not present

## 2017-06-27 DIAGNOSIS — Z7951 Long term (current) use of inhaled steroids: Secondary | ICD-10-CM | POA: Diagnosis not present

## 2017-06-27 DIAGNOSIS — E11621 Type 2 diabetes mellitus with foot ulcer: Secondary | ICD-10-CM | POA: Diagnosis not present

## 2017-06-27 DIAGNOSIS — L97415 Non-pressure chronic ulcer of right heel and midfoot with muscle involvement without evidence of necrosis: Secondary | ICD-10-CM | POA: Diagnosis not present

## 2017-06-27 DIAGNOSIS — E1142 Type 2 diabetes mellitus with diabetic polyneuropathy: Secondary | ICD-10-CM | POA: Diagnosis not present

## 2017-06-27 DIAGNOSIS — Z87440 Personal history of urinary (tract) infections: Secondary | ICD-10-CM | POA: Diagnosis not present

## 2017-06-27 DIAGNOSIS — Z7902 Long term (current) use of antithrombotics/antiplatelets: Secondary | ICD-10-CM | POA: Diagnosis not present

## 2017-06-27 DIAGNOSIS — N183 Chronic kidney disease, stage 3 (moderate): Secondary | ICD-10-CM | POA: Diagnosis not present

## 2017-06-27 DIAGNOSIS — I130 Hypertensive heart and chronic kidney disease with heart failure and stage 1 through stage 4 chronic kidney disease, or unspecified chronic kidney disease: Secondary | ICD-10-CM | POA: Diagnosis not present

## 2017-06-27 DIAGNOSIS — Z79891 Long term (current) use of opiate analgesic: Secondary | ICD-10-CM | POA: Diagnosis not present

## 2017-06-27 DIAGNOSIS — Z7982 Long term (current) use of aspirin: Secondary | ICD-10-CM | POA: Diagnosis not present

## 2017-06-27 DIAGNOSIS — Z794 Long term (current) use of insulin: Secondary | ICD-10-CM | POA: Diagnosis not present

## 2017-06-27 DIAGNOSIS — L97221 Non-pressure chronic ulcer of left calf limited to breakdown of skin: Secondary | ICD-10-CM | POA: Diagnosis not present

## 2017-06-27 DIAGNOSIS — E782 Mixed hyperlipidemia: Secondary | ICD-10-CM | POA: Diagnosis not present

## 2017-06-27 DIAGNOSIS — L97421 Non-pressure chronic ulcer of left heel and midfoot limited to breakdown of skin: Secondary | ICD-10-CM | POA: Diagnosis not present

## 2017-06-27 DIAGNOSIS — I252 Old myocardial infarction: Secondary | ICD-10-CM | POA: Diagnosis not present

## 2017-06-27 DIAGNOSIS — I2510 Atherosclerotic heart disease of native coronary artery without angina pectoris: Secondary | ICD-10-CM | POA: Diagnosis not present

## 2017-06-27 DIAGNOSIS — L97521 Non-pressure chronic ulcer of other part of left foot limited to breakdown of skin: Secondary | ICD-10-CM | POA: Diagnosis not present

## 2017-06-27 DIAGNOSIS — E11610 Type 2 diabetes mellitus with diabetic neuropathic arthropathy: Secondary | ICD-10-CM | POA: Diagnosis not present

## 2017-06-27 DIAGNOSIS — E1122 Type 2 diabetes mellitus with diabetic chronic kidney disease: Secondary | ICD-10-CM | POA: Diagnosis not present

## 2017-06-27 IMAGING — NM NM PULMONARY VENT & PERF
16 series · 16 of 16 positions shown · non-contrast
Comparison: Chest radiograph July 30, 2014

CLINICAL DATA: Shortness of Breath

EXAM:
NUCLEAR MEDICINE VENTILATION - PERFUSION LUNG SCAN
VIEWS:
Anterior, posterior, left lateral, right lateral, RPO, LPO, RAO, LAO
-ventilation and perfusion
RADIOPHARMACEUTICALS:  32.6 mCi 1echnetium-33m DTPA aerosol
inhalation and 4.16 mCi 1echnetium-33m MAA IV

[Series 1: ant/post vent · 4.14mm/px · 1 of 1 slices shown (1 of 2)]
[im 1/1]
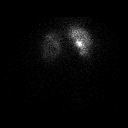

[Series 1: ant/post vent · 4.14mm/px · 1 of 1 slices shown (2 of 2)]
[im 1/1]
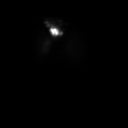

[Series 2: lao/rpo vent · 4.14mm/px · 1 of 1 slices shown (1 of 2)]
[im 1/1]
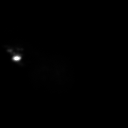

[Series 2: lao/rpo vent · 4.14mm/px · 1 of 1 slices shown (2 of 2)]
[im 1/1]
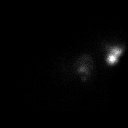

[Series 3: lpo/rao vent · 4.14mm/px · 1 of 1 slices shown (1 of 2)]
[im 1/1  full-range]
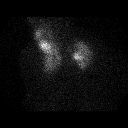

[Series 3: lpo/rao vent · 4.14mm/px · 1 of 1 slices shown (2 of 2)]
[im 1/1]
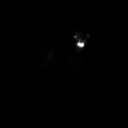

[Series 4: lt lat/rt lat vent · 4.14mm/px · 1 of 1 slices shown (1 of 2)]
[im 1/1]
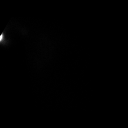

[Series 4: lt lat/rt lat vent · 4.14mm/px · 1 of 1 slices shown (2 of 2)]
[im 1/1]
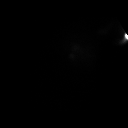

[Series 5: lt lat/rt lat perf · 4.14mm/px · 1 of 1 slices shown (1 of 2)]
[im 1/1]
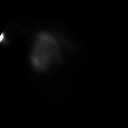

[Series 5: lt lat/rt lat perf · 4.14mm/px · 1 of 1 slices shown (2 of 2)]
[im 1/1]
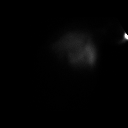

[Series 6: lpo/rao perf · 4.14mm/px · 1 of 1 slices shown (1 of 2)]
[im 1/1]
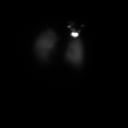

[Series 6: lpo/rao perf · 4.14mm/px · 1 of 1 slices shown (2 of 2)]
[im 1/1]
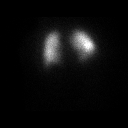

[Series 7: ant/post perf · 4.14mm/px · 1 of 1 slices shown (1 of 2)]
[im 1/1]
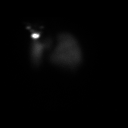

[Series 7: ant/post perf · 4.14mm/px · 1 of 1 slices shown (2 of 2)]
[im 1/1]
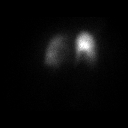

[Series 8: lao/rpo perf · 4.14mm/px · 1 of 1 slices shown (1 of 2)]
[im 1/1]
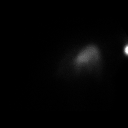

[Series 8: lao/rpo perf · 4.14mm/px · 1 of 1 slices shown (2 of 2)]
[im 1/1]
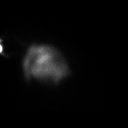

[16 of 16 positions shown; findings below may reference images not displayed]

FINDINGS: Ventilation: Radiotracer uptake appears homogeneous and symmetric
bilaterally. No focal ventilation defect is appreciable.

Perfusion: Radiotracer uptake bilaterally is prompt and symmetric.
No focal perfusion defects are evident. There is no appreciable
ventilation/perfusion mismatch.
IMPRESSION: There are no evident ventilation or perfusion defects. Study
constitutes a very low probability of pulmonary embolus.

## 2017-06-28 DIAGNOSIS — M79671 Pain in right foot: Secondary | ICD-10-CM | POA: Diagnosis not present

## 2017-06-28 DIAGNOSIS — E11621 Type 2 diabetes mellitus with foot ulcer: Secondary | ICD-10-CM | POA: Diagnosis not present

## 2017-06-28 DIAGNOSIS — L97412 Non-pressure chronic ulcer of right heel and midfoot with fat layer exposed: Secondary | ICD-10-CM | POA: Diagnosis not present

## 2017-06-28 DIAGNOSIS — M14671 Charcot's joint, right ankle and foot: Secondary | ICD-10-CM | POA: Diagnosis not present

## 2017-06-30 DIAGNOSIS — Z79891 Long term (current) use of opiate analgesic: Secondary | ICD-10-CM | POA: Diagnosis not present

## 2017-06-30 DIAGNOSIS — I5042 Chronic combined systolic (congestive) and diastolic (congestive) heart failure: Secondary | ICD-10-CM | POA: Diagnosis not present

## 2017-06-30 DIAGNOSIS — Z794 Long term (current) use of insulin: Secondary | ICD-10-CM | POA: Diagnosis not present

## 2017-06-30 DIAGNOSIS — N3000 Acute cystitis without hematuria: Secondary | ICD-10-CM | POA: Diagnosis not present

## 2017-06-30 DIAGNOSIS — L97421 Non-pressure chronic ulcer of left heel and midfoot limited to breakdown of skin: Secondary | ICD-10-CM | POA: Diagnosis not present

## 2017-06-30 DIAGNOSIS — E11621 Type 2 diabetes mellitus with foot ulcer: Secondary | ICD-10-CM | POA: Diagnosis not present

## 2017-06-30 DIAGNOSIS — E11610 Type 2 diabetes mellitus with diabetic neuropathic arthropathy: Secondary | ICD-10-CM | POA: Diagnosis not present

## 2017-06-30 DIAGNOSIS — L97415 Non-pressure chronic ulcer of right heel and midfoot with muscle involvement without evidence of necrosis: Secondary | ICD-10-CM | POA: Diagnosis not present

## 2017-06-30 DIAGNOSIS — Z7951 Long term (current) use of inhaled steroids: Secondary | ICD-10-CM | POA: Diagnosis not present

## 2017-06-30 DIAGNOSIS — Z87440 Personal history of urinary (tract) infections: Secondary | ICD-10-CM | POA: Diagnosis not present

## 2017-06-30 DIAGNOSIS — D631 Anemia in chronic kidney disease: Secondary | ICD-10-CM | POA: Diagnosis not present

## 2017-06-30 DIAGNOSIS — Z9181 History of falling: Secondary | ICD-10-CM | POA: Diagnosis not present

## 2017-06-30 DIAGNOSIS — I130 Hypertensive heart and chronic kidney disease with heart failure and stage 1 through stage 4 chronic kidney disease, or unspecified chronic kidney disease: Secondary | ICD-10-CM | POA: Diagnosis not present

## 2017-06-30 DIAGNOSIS — N183 Chronic kidney disease, stage 3 (moderate): Secondary | ICD-10-CM | POA: Diagnosis not present

## 2017-06-30 DIAGNOSIS — E1122 Type 2 diabetes mellitus with diabetic chronic kidney disease: Secondary | ICD-10-CM | POA: Diagnosis not present

## 2017-06-30 DIAGNOSIS — L97521 Non-pressure chronic ulcer of other part of left foot limited to breakdown of skin: Secondary | ICD-10-CM | POA: Diagnosis not present

## 2017-06-30 DIAGNOSIS — E1142 Type 2 diabetes mellitus with diabetic polyneuropathy: Secondary | ICD-10-CM | POA: Diagnosis not present

## 2017-06-30 DIAGNOSIS — E1151 Type 2 diabetes mellitus with diabetic peripheral angiopathy without gangrene: Secondary | ICD-10-CM | POA: Diagnosis not present

## 2017-06-30 DIAGNOSIS — Z7902 Long term (current) use of antithrombotics/antiplatelets: Secondary | ICD-10-CM | POA: Diagnosis not present

## 2017-06-30 DIAGNOSIS — L97221 Non-pressure chronic ulcer of left calf limited to breakdown of skin: Secondary | ICD-10-CM | POA: Diagnosis not present

## 2017-06-30 DIAGNOSIS — Z7982 Long term (current) use of aspirin: Secondary | ICD-10-CM | POA: Diagnosis not present

## 2017-06-30 DIAGNOSIS — I252 Old myocardial infarction: Secondary | ICD-10-CM | POA: Diagnosis not present

## 2017-06-30 DIAGNOSIS — E782 Mixed hyperlipidemia: Secondary | ICD-10-CM | POA: Diagnosis not present

## 2017-06-30 DIAGNOSIS — I2510 Atherosclerotic heart disease of native coronary artery without angina pectoris: Secondary | ICD-10-CM | POA: Diagnosis not present

## 2017-07-01 DIAGNOSIS — L97411 Non-pressure chronic ulcer of right heel and midfoot limited to breakdown of skin: Secondary | ICD-10-CM | POA: Diagnosis not present

## 2017-07-01 DIAGNOSIS — E11621 Type 2 diabetes mellitus with foot ulcer: Secondary | ICD-10-CM | POA: Diagnosis not present

## 2017-07-02 DIAGNOSIS — L97415 Non-pressure chronic ulcer of right heel and midfoot with muscle involvement without evidence of necrosis: Secondary | ICD-10-CM | POA: Diagnosis not present

## 2017-07-02 DIAGNOSIS — E11621 Type 2 diabetes mellitus with foot ulcer: Secondary | ICD-10-CM | POA: Diagnosis not present

## 2017-07-02 DIAGNOSIS — R600 Localized edema: Secondary | ICD-10-CM | POA: Diagnosis not present

## 2017-07-02 DIAGNOSIS — I890 Lymphedema, not elsewhere classified: Secondary | ICD-10-CM | POA: Diagnosis not present

## 2017-07-02 DIAGNOSIS — I87331 Chronic venous hypertension (idiopathic) with ulcer and inflammation of right lower extremity: Secondary | ICD-10-CM | POA: Diagnosis not present

## 2017-07-02 DIAGNOSIS — I872 Venous insufficiency (chronic) (peripheral): Secondary | ICD-10-CM | POA: Diagnosis not present

## 2017-07-02 DIAGNOSIS — E1142 Type 2 diabetes mellitus with diabetic polyneuropathy: Secondary | ICD-10-CM | POA: Diagnosis not present

## 2017-07-04 ENCOUNTER — Other Ambulatory Visit: Payer: Self-pay | Admitting: *Deleted

## 2017-07-04 MED ORDER — PANTOPRAZOLE SODIUM 40 MG PO TBEC: 40.0000 mg | DELAYED_RELEASE_TABLET | Freq: Every day | ORAL | 1 refills | Status: AC

## 2017-07-09 DIAGNOSIS — Z87440 Personal history of urinary (tract) infections: Secondary | ICD-10-CM | POA: Diagnosis not present

## 2017-07-09 DIAGNOSIS — I252 Old myocardial infarction: Secondary | ICD-10-CM | POA: Diagnosis not present

## 2017-07-09 DIAGNOSIS — Z794 Long term (current) use of insulin: Secondary | ICD-10-CM | POA: Diagnosis not present

## 2017-07-09 DIAGNOSIS — D631 Anemia in chronic kidney disease: Secondary | ICD-10-CM | POA: Diagnosis not present

## 2017-07-09 DIAGNOSIS — Z79891 Long term (current) use of opiate analgesic: Secondary | ICD-10-CM | POA: Diagnosis not present

## 2017-07-09 DIAGNOSIS — Z9181 History of falling: Secondary | ICD-10-CM | POA: Diagnosis not present

## 2017-07-09 DIAGNOSIS — E11621 Type 2 diabetes mellitus with foot ulcer: Secondary | ICD-10-CM | POA: Diagnosis not present

## 2017-07-09 DIAGNOSIS — Z7982 Long term (current) use of aspirin: Secondary | ICD-10-CM | POA: Diagnosis not present

## 2017-07-09 DIAGNOSIS — E1151 Type 2 diabetes mellitus with diabetic peripheral angiopathy without gangrene: Secondary | ICD-10-CM | POA: Diagnosis not present

## 2017-07-09 DIAGNOSIS — E1122 Type 2 diabetes mellitus with diabetic chronic kidney disease: Secondary | ICD-10-CM | POA: Diagnosis not present

## 2017-07-09 DIAGNOSIS — L97421 Non-pressure chronic ulcer of left heel and midfoot limited to breakdown of skin: Secondary | ICD-10-CM | POA: Diagnosis not present

## 2017-07-09 DIAGNOSIS — L97221 Non-pressure chronic ulcer of left calf limited to breakdown of skin: Secondary | ICD-10-CM | POA: Diagnosis not present

## 2017-07-09 DIAGNOSIS — I2510 Atherosclerotic heart disease of native coronary artery without angina pectoris: Secondary | ICD-10-CM | POA: Diagnosis not present

## 2017-07-09 DIAGNOSIS — Z7951 Long term (current) use of inhaled steroids: Secondary | ICD-10-CM | POA: Diagnosis not present

## 2017-07-09 DIAGNOSIS — I130 Hypertensive heart and chronic kidney disease with heart failure and stage 1 through stage 4 chronic kidney disease, or unspecified chronic kidney disease: Secondary | ICD-10-CM | POA: Diagnosis not present

## 2017-07-09 DIAGNOSIS — L97415 Non-pressure chronic ulcer of right heel and midfoot with muscle involvement without evidence of necrosis: Secondary | ICD-10-CM | POA: Diagnosis not present

## 2017-07-09 DIAGNOSIS — L97521 Non-pressure chronic ulcer of other part of left foot limited to breakdown of skin: Secondary | ICD-10-CM | POA: Diagnosis not present

## 2017-07-09 DIAGNOSIS — E1142 Type 2 diabetes mellitus with diabetic polyneuropathy: Secondary | ICD-10-CM | POA: Diagnosis not present

## 2017-07-09 DIAGNOSIS — Z7902 Long term (current) use of antithrombotics/antiplatelets: Secondary | ICD-10-CM | POA: Diagnosis not present

## 2017-07-09 DIAGNOSIS — I5042 Chronic combined systolic (congestive) and diastolic (congestive) heart failure: Secondary | ICD-10-CM | POA: Diagnosis not present

## 2017-07-09 DIAGNOSIS — N183 Chronic kidney disease, stage 3 (moderate): Secondary | ICD-10-CM | POA: Diagnosis not present

## 2017-07-09 DIAGNOSIS — E782 Mixed hyperlipidemia: Secondary | ICD-10-CM | POA: Diagnosis not present

## 2017-07-09 DIAGNOSIS — E11610 Type 2 diabetes mellitus with diabetic neuropathic arthropathy: Secondary | ICD-10-CM | POA: Diagnosis not present

## 2017-07-10 DIAGNOSIS — I2510 Atherosclerotic heart disease of native coronary artery without angina pectoris: Secondary | ICD-10-CM | POA: Diagnosis not present

## 2017-07-10 DIAGNOSIS — L97521 Non-pressure chronic ulcer of other part of left foot limited to breakdown of skin: Secondary | ICD-10-CM | POA: Diagnosis not present

## 2017-07-10 DIAGNOSIS — D631 Anemia in chronic kidney disease: Secondary | ICD-10-CM | POA: Diagnosis not present

## 2017-07-10 DIAGNOSIS — E11621 Type 2 diabetes mellitus with foot ulcer: Secondary | ICD-10-CM | POA: Diagnosis not present

## 2017-07-10 DIAGNOSIS — E1122 Type 2 diabetes mellitus with diabetic chronic kidney disease: Secondary | ICD-10-CM | POA: Diagnosis not present

## 2017-07-10 DIAGNOSIS — I252 Old myocardial infarction: Secondary | ICD-10-CM | POA: Diagnosis not present

## 2017-07-10 DIAGNOSIS — I5042 Chronic combined systolic (congestive) and diastolic (congestive) heart failure: Secondary | ICD-10-CM | POA: Diagnosis not present

## 2017-07-10 DIAGNOSIS — E1151 Type 2 diabetes mellitus with diabetic peripheral angiopathy without gangrene: Secondary | ICD-10-CM | POA: Diagnosis not present

## 2017-07-10 DIAGNOSIS — Z794 Long term (current) use of insulin: Secondary | ICD-10-CM | POA: Diagnosis not present

## 2017-07-10 DIAGNOSIS — Z87440 Personal history of urinary (tract) infections: Secondary | ICD-10-CM | POA: Diagnosis not present

## 2017-07-10 DIAGNOSIS — E1142 Type 2 diabetes mellitus with diabetic polyneuropathy: Secondary | ICD-10-CM | POA: Diagnosis not present

## 2017-07-10 DIAGNOSIS — Z7951 Long term (current) use of inhaled steroids: Secondary | ICD-10-CM | POA: Diagnosis not present

## 2017-07-10 DIAGNOSIS — Z7982 Long term (current) use of aspirin: Secondary | ICD-10-CM | POA: Diagnosis not present

## 2017-07-10 DIAGNOSIS — Z7902 Long term (current) use of antithrombotics/antiplatelets: Secondary | ICD-10-CM | POA: Diagnosis not present

## 2017-07-10 DIAGNOSIS — L97415 Non-pressure chronic ulcer of right heel and midfoot with muscle involvement without evidence of necrosis: Secondary | ICD-10-CM | POA: Diagnosis not present

## 2017-07-10 DIAGNOSIS — E782 Mixed hyperlipidemia: Secondary | ICD-10-CM | POA: Diagnosis not present

## 2017-07-10 DIAGNOSIS — Z79891 Long term (current) use of opiate analgesic: Secondary | ICD-10-CM | POA: Diagnosis not present

## 2017-07-10 DIAGNOSIS — L97221 Non-pressure chronic ulcer of left calf limited to breakdown of skin: Secondary | ICD-10-CM | POA: Diagnosis not present

## 2017-07-10 DIAGNOSIS — N183 Chronic kidney disease, stage 3 (moderate): Secondary | ICD-10-CM | POA: Diagnosis not present

## 2017-07-10 DIAGNOSIS — L97421 Non-pressure chronic ulcer of left heel and midfoot limited to breakdown of skin: Secondary | ICD-10-CM | POA: Diagnosis not present

## 2017-07-10 DIAGNOSIS — E11610 Type 2 diabetes mellitus with diabetic neuropathic arthropathy: Secondary | ICD-10-CM | POA: Diagnosis not present

## 2017-07-10 DIAGNOSIS — I130 Hypertensive heart and chronic kidney disease with heart failure and stage 1 through stage 4 chronic kidney disease, or unspecified chronic kidney disease: Secondary | ICD-10-CM | POA: Diagnosis not present

## 2017-07-10 DIAGNOSIS — Z9181 History of falling: Secondary | ICD-10-CM | POA: Diagnosis not present

## 2017-07-11 DIAGNOSIS — I219 Acute myocardial infarction, unspecified: Secondary | ICD-10-CM

## 2017-07-11 HISTORY — DX: Acute myocardial infarction, unspecified: I21.9

## 2017-07-11 HISTORY — PX: CORONARY ANGIOPLASTY WITH STENT PLACEMENT: SHX49

## 2017-07-12 DIAGNOSIS — E11621 Type 2 diabetes mellitus with foot ulcer: Secondary | ICD-10-CM | POA: Diagnosis not present

## 2017-07-12 DIAGNOSIS — Z87440 Personal history of urinary (tract) infections: Secondary | ICD-10-CM | POA: Diagnosis not present

## 2017-07-12 DIAGNOSIS — Z7951 Long term (current) use of inhaled steroids: Secondary | ICD-10-CM | POA: Diagnosis not present

## 2017-07-12 DIAGNOSIS — Z7902 Long term (current) use of antithrombotics/antiplatelets: Secondary | ICD-10-CM | POA: Diagnosis not present

## 2017-07-12 DIAGNOSIS — D631 Anemia in chronic kidney disease: Secondary | ICD-10-CM | POA: Diagnosis not present

## 2017-07-12 DIAGNOSIS — L97421 Non-pressure chronic ulcer of left heel and midfoot limited to breakdown of skin: Secondary | ICD-10-CM | POA: Diagnosis not present

## 2017-07-12 DIAGNOSIS — E1151 Type 2 diabetes mellitus with diabetic peripheral angiopathy without gangrene: Secondary | ICD-10-CM | POA: Diagnosis not present

## 2017-07-12 DIAGNOSIS — E782 Mixed hyperlipidemia: Secondary | ICD-10-CM | POA: Diagnosis not present

## 2017-07-12 DIAGNOSIS — Z9181 History of falling: Secondary | ICD-10-CM | POA: Diagnosis not present

## 2017-07-12 DIAGNOSIS — L97415 Non-pressure chronic ulcer of right heel and midfoot with muscle involvement without evidence of necrosis: Secondary | ICD-10-CM | POA: Diagnosis not present

## 2017-07-12 DIAGNOSIS — Z7982 Long term (current) use of aspirin: Secondary | ICD-10-CM | POA: Diagnosis not present

## 2017-07-12 DIAGNOSIS — I130 Hypertensive heart and chronic kidney disease with heart failure and stage 1 through stage 4 chronic kidney disease, or unspecified chronic kidney disease: Secondary | ICD-10-CM | POA: Diagnosis not present

## 2017-07-12 DIAGNOSIS — E1122 Type 2 diabetes mellitus with diabetic chronic kidney disease: Secondary | ICD-10-CM | POA: Diagnosis not present

## 2017-07-12 DIAGNOSIS — Z79891 Long term (current) use of opiate analgesic: Secondary | ICD-10-CM | POA: Diagnosis not present

## 2017-07-12 DIAGNOSIS — L97521 Non-pressure chronic ulcer of other part of left foot limited to breakdown of skin: Secondary | ICD-10-CM | POA: Diagnosis not present

## 2017-07-12 DIAGNOSIS — I5042 Chronic combined systolic (congestive) and diastolic (congestive) heart failure: Secondary | ICD-10-CM | POA: Diagnosis not present

## 2017-07-12 DIAGNOSIS — E1142 Type 2 diabetes mellitus with diabetic polyneuropathy: Secondary | ICD-10-CM | POA: Diagnosis not present

## 2017-07-12 DIAGNOSIS — I252 Old myocardial infarction: Secondary | ICD-10-CM | POA: Diagnosis not present

## 2017-07-12 DIAGNOSIS — L97221 Non-pressure chronic ulcer of left calf limited to breakdown of skin: Secondary | ICD-10-CM | POA: Diagnosis not present

## 2017-07-12 DIAGNOSIS — I2510 Atherosclerotic heart disease of native coronary artery without angina pectoris: Secondary | ICD-10-CM | POA: Diagnosis not present

## 2017-07-12 DIAGNOSIS — Z794 Long term (current) use of insulin: Secondary | ICD-10-CM | POA: Diagnosis not present

## 2017-07-12 DIAGNOSIS — E11610 Type 2 diabetes mellitus with diabetic neuropathic arthropathy: Secondary | ICD-10-CM | POA: Diagnosis not present

## 2017-07-12 DIAGNOSIS — N183 Chronic kidney disease, stage 3 (moderate): Secondary | ICD-10-CM | POA: Diagnosis not present

## 2017-07-14 DIAGNOSIS — E11610 Type 2 diabetes mellitus with diabetic neuropathic arthropathy: Secondary | ICD-10-CM | POA: Diagnosis not present

## 2017-07-14 DIAGNOSIS — Z794 Long term (current) use of insulin: Secondary | ICD-10-CM | POA: Diagnosis not present

## 2017-07-14 DIAGNOSIS — I87331 Chronic venous hypertension (idiopathic) with ulcer and inflammation of right lower extremity: Secondary | ICD-10-CM | POA: Diagnosis not present

## 2017-07-14 DIAGNOSIS — E1122 Type 2 diabetes mellitus with diabetic chronic kidney disease: Secondary | ICD-10-CM | POA: Diagnosis not present

## 2017-07-14 DIAGNOSIS — I25110 Atherosclerotic heart disease of native coronary artery with unstable angina pectoris: Secondary | ICD-10-CM | POA: Diagnosis not present

## 2017-07-14 DIAGNOSIS — E11621 Type 2 diabetes mellitus with foot ulcer: Secondary | ICD-10-CM | POA: Diagnosis not present

## 2017-07-14 DIAGNOSIS — N183 Chronic kidney disease, stage 3 (moderate): Secondary | ICD-10-CM | POA: Diagnosis not present

## 2017-07-14 DIAGNOSIS — I890 Lymphedema, not elsewhere classified: Secondary | ICD-10-CM | POA: Diagnosis not present

## 2017-07-14 DIAGNOSIS — I2510 Atherosclerotic heart disease of native coronary artery without angina pectoris: Secondary | ICD-10-CM | POA: Diagnosis not present

## 2017-07-14 DIAGNOSIS — E1142 Type 2 diabetes mellitus with diabetic polyneuropathy: Secondary | ICD-10-CM | POA: Diagnosis not present

## 2017-07-14 DIAGNOSIS — L97415 Non-pressure chronic ulcer of right heel and midfoot with muscle involvement without evidence of necrosis: Secondary | ICD-10-CM | POA: Diagnosis not present

## 2017-07-14 DIAGNOSIS — M14671 Charcot's joint, right ankle and foot: Secondary | ICD-10-CM | POA: Diagnosis not present

## 2017-07-15 DIAGNOSIS — L97421 Non-pressure chronic ulcer of left heel and midfoot limited to breakdown of skin: Secondary | ICD-10-CM | POA: Diagnosis not present

## 2017-07-15 DIAGNOSIS — D631 Anemia in chronic kidney disease: Secondary | ICD-10-CM | POA: Diagnosis not present

## 2017-07-15 DIAGNOSIS — L97221 Non-pressure chronic ulcer of left calf limited to breakdown of skin: Secondary | ICD-10-CM | POA: Diagnosis not present

## 2017-07-15 DIAGNOSIS — E11621 Type 2 diabetes mellitus with foot ulcer: Secondary | ICD-10-CM | POA: Diagnosis not present

## 2017-07-15 DIAGNOSIS — L97521 Non-pressure chronic ulcer of other part of left foot limited to breakdown of skin: Secondary | ICD-10-CM | POA: Diagnosis not present

## 2017-07-15 DIAGNOSIS — Z794 Long term (current) use of insulin: Secondary | ICD-10-CM | POA: Diagnosis not present

## 2017-07-15 DIAGNOSIS — Z79891 Long term (current) use of opiate analgesic: Secondary | ICD-10-CM | POA: Diagnosis not present

## 2017-07-15 DIAGNOSIS — Z7951 Long term (current) use of inhaled steroids: Secondary | ICD-10-CM | POA: Diagnosis not present

## 2017-07-15 DIAGNOSIS — Z87440 Personal history of urinary (tract) infections: Secondary | ICD-10-CM | POA: Diagnosis not present

## 2017-07-15 DIAGNOSIS — E782 Mixed hyperlipidemia: Secondary | ICD-10-CM | POA: Diagnosis not present

## 2017-07-15 DIAGNOSIS — N183 Chronic kidney disease, stage 3 (moderate): Secondary | ICD-10-CM | POA: Diagnosis not present

## 2017-07-15 DIAGNOSIS — E1122 Type 2 diabetes mellitus with diabetic chronic kidney disease: Secondary | ICD-10-CM | POA: Diagnosis not present

## 2017-07-15 DIAGNOSIS — Z9181 History of falling: Secondary | ICD-10-CM | POA: Diagnosis not present

## 2017-07-15 DIAGNOSIS — I130 Hypertensive heart and chronic kidney disease with heart failure and stage 1 through stage 4 chronic kidney disease, or unspecified chronic kidney disease: Secondary | ICD-10-CM | POA: Diagnosis not present

## 2017-07-15 DIAGNOSIS — L97415 Non-pressure chronic ulcer of right heel and midfoot with muscle involvement without evidence of necrosis: Secondary | ICD-10-CM | POA: Diagnosis not present

## 2017-07-15 DIAGNOSIS — E1142 Type 2 diabetes mellitus with diabetic polyneuropathy: Secondary | ICD-10-CM | POA: Diagnosis not present

## 2017-07-15 DIAGNOSIS — E1151 Type 2 diabetes mellitus with diabetic peripheral angiopathy without gangrene: Secondary | ICD-10-CM | POA: Diagnosis not present

## 2017-07-15 DIAGNOSIS — I252 Old myocardial infarction: Secondary | ICD-10-CM | POA: Diagnosis not present

## 2017-07-15 DIAGNOSIS — I5042 Chronic combined systolic (congestive) and diastolic (congestive) heart failure: Secondary | ICD-10-CM | POA: Diagnosis not present

## 2017-07-15 DIAGNOSIS — I2510 Atherosclerotic heart disease of native coronary artery without angina pectoris: Secondary | ICD-10-CM | POA: Diagnosis not present

## 2017-07-15 DIAGNOSIS — Z7902 Long term (current) use of antithrombotics/antiplatelets: Secondary | ICD-10-CM | POA: Diagnosis not present

## 2017-07-15 DIAGNOSIS — Z7982 Long term (current) use of aspirin: Secondary | ICD-10-CM | POA: Diagnosis not present

## 2017-07-15 DIAGNOSIS — E11610 Type 2 diabetes mellitus with diabetic neuropathic arthropathy: Secondary | ICD-10-CM | POA: Diagnosis not present

## 2017-07-17 DIAGNOSIS — E1142 Type 2 diabetes mellitus with diabetic polyneuropathy: Secondary | ICD-10-CM | POA: Diagnosis not present

## 2017-07-17 DIAGNOSIS — D631 Anemia in chronic kidney disease: Secondary | ICD-10-CM | POA: Diagnosis not present

## 2017-07-17 DIAGNOSIS — L97421 Non-pressure chronic ulcer of left heel and midfoot limited to breakdown of skin: Secondary | ICD-10-CM | POA: Diagnosis not present

## 2017-07-17 DIAGNOSIS — Z79891 Long term (current) use of opiate analgesic: Secondary | ICD-10-CM | POA: Diagnosis not present

## 2017-07-17 DIAGNOSIS — I252 Old myocardial infarction: Secondary | ICD-10-CM | POA: Diagnosis not present

## 2017-07-17 DIAGNOSIS — E11621 Type 2 diabetes mellitus with foot ulcer: Secondary | ICD-10-CM | POA: Diagnosis not present

## 2017-07-17 DIAGNOSIS — E1122 Type 2 diabetes mellitus with diabetic chronic kidney disease: Secondary | ICD-10-CM | POA: Diagnosis not present

## 2017-07-17 DIAGNOSIS — L97521 Non-pressure chronic ulcer of other part of left foot limited to breakdown of skin: Secondary | ICD-10-CM | POA: Diagnosis not present

## 2017-07-17 DIAGNOSIS — N183 Chronic kidney disease, stage 3 (moderate): Secondary | ICD-10-CM | POA: Diagnosis not present

## 2017-07-17 DIAGNOSIS — Z7951 Long term (current) use of inhaled steroids: Secondary | ICD-10-CM | POA: Diagnosis not present

## 2017-07-17 DIAGNOSIS — Z87440 Personal history of urinary (tract) infections: Secondary | ICD-10-CM | POA: Diagnosis not present

## 2017-07-17 DIAGNOSIS — Z9181 History of falling: Secondary | ICD-10-CM | POA: Diagnosis not present

## 2017-07-17 DIAGNOSIS — I2510 Atherosclerotic heart disease of native coronary artery without angina pectoris: Secondary | ICD-10-CM | POA: Diagnosis not present

## 2017-07-17 DIAGNOSIS — E1151 Type 2 diabetes mellitus with diabetic peripheral angiopathy without gangrene: Secondary | ICD-10-CM | POA: Diagnosis not present

## 2017-07-17 DIAGNOSIS — E11610 Type 2 diabetes mellitus with diabetic neuropathic arthropathy: Secondary | ICD-10-CM | POA: Diagnosis not present

## 2017-07-17 DIAGNOSIS — L97221 Non-pressure chronic ulcer of left calf limited to breakdown of skin: Secondary | ICD-10-CM | POA: Diagnosis not present

## 2017-07-17 DIAGNOSIS — I130 Hypertensive heart and chronic kidney disease with heart failure and stage 1 through stage 4 chronic kidney disease, or unspecified chronic kidney disease: Secondary | ICD-10-CM | POA: Diagnosis not present

## 2017-07-17 DIAGNOSIS — L97415 Non-pressure chronic ulcer of right heel and midfoot with muscle involvement without evidence of necrosis: Secondary | ICD-10-CM | POA: Diagnosis not present

## 2017-07-17 DIAGNOSIS — E782 Mixed hyperlipidemia: Secondary | ICD-10-CM | POA: Diagnosis not present

## 2017-07-17 DIAGNOSIS — Z7982 Long term (current) use of aspirin: Secondary | ICD-10-CM | POA: Diagnosis not present

## 2017-07-17 DIAGNOSIS — Z7902 Long term (current) use of antithrombotics/antiplatelets: Secondary | ICD-10-CM | POA: Diagnosis not present

## 2017-07-17 DIAGNOSIS — Z794 Long term (current) use of insulin: Secondary | ICD-10-CM | POA: Diagnosis not present

## 2017-07-17 DIAGNOSIS — I5042 Chronic combined systolic (congestive) and diastolic (congestive) heart failure: Secondary | ICD-10-CM | POA: Diagnosis not present

## 2017-07-21 DIAGNOSIS — I87331 Chronic venous hypertension (idiopathic) with ulcer and inflammation of right lower extremity: Secondary | ICD-10-CM | POA: Diagnosis not present

## 2017-07-21 DIAGNOSIS — L97319 Non-pressure chronic ulcer of right ankle with unspecified severity: Secondary | ICD-10-CM | POA: Diagnosis not present

## 2017-07-21 DIAGNOSIS — E11621 Type 2 diabetes mellitus with foot ulcer: Secondary | ICD-10-CM | POA: Diagnosis not present

## 2017-07-21 DIAGNOSIS — I890 Lymphedema, not elsewhere classified: Secondary | ICD-10-CM | POA: Diagnosis not present

## 2017-07-21 DIAGNOSIS — I872 Venous insufficiency (chronic) (peripheral): Secondary | ICD-10-CM | POA: Diagnosis not present

## 2017-07-21 DIAGNOSIS — E1142 Type 2 diabetes mellitus with diabetic polyneuropathy: Secondary | ICD-10-CM | POA: Diagnosis not present

## 2017-07-21 DIAGNOSIS — M14671 Charcot's joint, right ankle and foot: Secondary | ICD-10-CM | POA: Diagnosis not present

## 2017-07-21 DIAGNOSIS — R600 Localized edema: Secondary | ICD-10-CM | POA: Diagnosis not present

## 2017-07-21 DIAGNOSIS — L97415 Non-pressure chronic ulcer of right heel and midfoot with muscle involvement without evidence of necrosis: Secondary | ICD-10-CM | POA: Diagnosis not present

## 2017-07-23 ENCOUNTER — Telehealth: Payer: Self-pay | Admitting: Sports Medicine

## 2017-07-23 NOTE — Telephone Encounter (Signed)
Patient sister called stating that she was concerned about her sister's current care and was told that her sister is to have a amputation of her leg.  Patient sister is not on approved on release of medical information so I cannot discuss this with her however if the patient or her husband is concerned they may call for me to talk with them about this however at this time I cannot call patient sister's back to discuss. -Dr. Marylene Land

## 2017-07-23 NOTE — Telephone Encounter (Signed)
Dr Stover 

## 2017-07-23 NOTE — Telephone Encounter (Signed)
Pt's sister called wanting to speak with Dr. Marylene Land to inform her about her sister. She states that her sister is going to have a amputation on her leg. Please call pt's sister [phone]

## 2017-07-28 DIAGNOSIS — L97309 Non-pressure chronic ulcer of unspecified ankle with unspecified severity: Secondary | ICD-10-CM | POA: Diagnosis not present

## 2017-07-28 DIAGNOSIS — R609 Edema, unspecified: Secondary | ICD-10-CM | POA: Diagnosis not present

## 2017-07-28 DIAGNOSIS — E11622 Type 2 diabetes mellitus with other skin ulcer: Secondary | ICD-10-CM | POA: Diagnosis not present

## 2017-07-28 DIAGNOSIS — D649 Anemia, unspecified: Secondary | ICD-10-CM | POA: Diagnosis not present

## 2017-07-28 DIAGNOSIS — L03116 Cellulitis of left lower limb: Secondary | ICD-10-CM | POA: Diagnosis not present

## 2017-07-28 DIAGNOSIS — E1121 Type 2 diabetes mellitus with diabetic nephropathy: Secondary | ICD-10-CM | POA: Diagnosis not present

## 2017-07-28 DIAGNOSIS — L89151 Pressure ulcer of sacral region, stage 1: Secondary | ICD-10-CM | POA: Diagnosis not present

## 2017-07-28 DIAGNOSIS — E872 Acidosis: Secondary | ICD-10-CM | POA: Diagnosis not present

## 2017-07-28 DIAGNOSIS — I872 Venous insufficiency (chronic) (peripheral): Secondary | ICD-10-CM | POA: Diagnosis not present

## 2017-07-28 DIAGNOSIS — E11610 Type 2 diabetes mellitus with diabetic neuropathic arthropathy: Secondary | ICD-10-CM | POA: Diagnosis not present

## 2017-07-28 DIAGNOSIS — L03115 Cellulitis of right lower limb: Secondary | ICD-10-CM | POA: Diagnosis not present

## 2017-07-28 DIAGNOSIS — I214 Non-ST elevation (NSTEMI) myocardial infarction: Secondary | ICD-10-CM | POA: Diagnosis not present

## 2017-07-28 DIAGNOSIS — I87331 Chronic venous hypertension (idiopathic) with ulcer and inflammation of right lower extremity: Secondary | ICD-10-CM | POA: Diagnosis not present

## 2017-07-28 DIAGNOSIS — E1165 Type 2 diabetes mellitus with hyperglycemia: Secondary | ICD-10-CM | POA: Diagnosis not present

## 2017-07-28 DIAGNOSIS — E875 Hyperkalemia: Secondary | ICD-10-CM | POA: Diagnosis not present

## 2017-07-28 DIAGNOSIS — E1122 Type 2 diabetes mellitus with diabetic chronic kidney disease: Secondary | ICD-10-CM | POA: Diagnosis not present

## 2017-07-28 DIAGNOSIS — E118 Type 2 diabetes mellitus with unspecified complications: Secondary | ICD-10-CM | POA: Diagnosis not present

## 2017-07-28 DIAGNOSIS — L97919 Non-pressure chronic ulcer of unspecified part of right lower leg with unspecified severity: Secondary | ICD-10-CM | POA: Diagnosis not present

## 2017-07-28 DIAGNOSIS — E1151 Type 2 diabetes mellitus with diabetic peripheral angiopathy without gangrene: Secondary | ICD-10-CM | POA: Diagnosis not present

## 2017-07-28 DIAGNOSIS — M25551 Pain in right hip: Secondary | ICD-10-CM | POA: Diagnosis not present

## 2017-07-28 DIAGNOSIS — Z794 Long term (current) use of insulin: Secondary | ICD-10-CM | POA: Diagnosis not present

## 2017-07-28 DIAGNOSIS — M14671 Charcot's joint, right ankle and foot: Secondary | ICD-10-CM | POA: Diagnosis not present

## 2017-07-28 DIAGNOSIS — I130 Hypertensive heart and chronic kidney disease with heart failure and stage 1 through stage 4 chronic kidney disease, or unspecified chronic kidney disease: Secondary | ICD-10-CM | POA: Diagnosis not present

## 2017-07-28 DIAGNOSIS — J811 Chronic pulmonary edema: Secondary | ICD-10-CM | POA: Diagnosis not present

## 2017-07-28 DIAGNOSIS — E11621 Type 2 diabetes mellitus with foot ulcer: Secondary | ICD-10-CM | POA: Diagnosis not present

## 2017-07-28 DIAGNOSIS — I878 Other specified disorders of veins: Secondary | ICD-10-CM | POA: Diagnosis not present

## 2017-07-28 DIAGNOSIS — Z9861 Coronary angioplasty status: Secondary | ICD-10-CM | POA: Diagnosis not present

## 2017-07-28 DIAGNOSIS — E782 Mixed hyperlipidemia: Secondary | ICD-10-CM | POA: Diagnosis not present

## 2017-07-28 DIAGNOSIS — I5043 Acute on chronic combined systolic (congestive) and diastolic (congestive) heart failure: Secondary | ICD-10-CM | POA: Diagnosis not present

## 2017-07-28 DIAGNOSIS — L97929 Non-pressure chronic ulcer of unspecified part of left lower leg with unspecified severity: Secondary | ICD-10-CM | POA: Diagnosis not present

## 2017-07-28 DIAGNOSIS — L97829 Non-pressure chronic ulcer of other part of left lower leg with unspecified severity: Secondary | ICD-10-CM | POA: Diagnosis not present

## 2017-07-28 DIAGNOSIS — I2510 Atherosclerotic heart disease of native coronary artery without angina pectoris: Secondary | ICD-10-CM | POA: Diagnosis not present

## 2017-07-28 DIAGNOSIS — L97819 Non-pressure chronic ulcer of other part of right lower leg with unspecified severity: Secondary | ICD-10-CM | POA: Diagnosis not present

## 2017-07-28 DIAGNOSIS — E1142 Type 2 diabetes mellitus with diabetic polyneuropathy: Secondary | ICD-10-CM | POA: Diagnosis not present

## 2017-07-28 DIAGNOSIS — R509 Fever, unspecified: Secondary | ICD-10-CM | POA: Diagnosis not present

## 2017-07-28 DIAGNOSIS — E11649 Type 2 diabetes mellitus with hypoglycemia without coma: Secondary | ICD-10-CM | POA: Diagnosis not present

## 2017-07-28 DIAGNOSIS — L97519 Non-pressure chronic ulcer of other part of right foot with unspecified severity: Secondary | ICD-10-CM | POA: Diagnosis not present

## 2017-07-28 DIAGNOSIS — E119 Type 2 diabetes mellitus without complications: Secondary | ICD-10-CM | POA: Diagnosis not present

## 2017-07-28 DIAGNOSIS — L97529 Non-pressure chronic ulcer of other part of left foot with unspecified severity: Secondary | ICD-10-CM | POA: Diagnosis not present

## 2017-07-28 DIAGNOSIS — I5041 Acute combined systolic (congestive) and diastolic (congestive) heart failure: Secondary | ICD-10-CM | POA: Diagnosis not present

## 2017-07-28 DIAGNOSIS — I129 Hypertensive chronic kidney disease with stage 1 through stage 4 chronic kidney disease, or unspecified chronic kidney disease: Secondary | ICD-10-CM | POA: Diagnosis not present

## 2017-07-28 DIAGNOSIS — M25552 Pain in left hip: Secondary | ICD-10-CM | POA: Diagnosis not present

## 2017-07-28 DIAGNOSIS — N183 Chronic kidney disease, stage 3 (moderate): Secondary | ICD-10-CM | POA: Diagnosis not present

## 2017-07-28 DIAGNOSIS — R1031 Right lower quadrant pain: Secondary | ICD-10-CM | POA: Diagnosis not present

## 2017-07-29 ENCOUNTER — Telehealth: Payer: Self-pay | Admitting: *Deleted

## 2017-07-29 NOTE — Telephone Encounter (Signed)
Female caller states pt is having her leg removed at [facility] now.

## 2017-07-29 NOTE — Telephone Encounter (Signed)
Thank you :)

## 2017-07-31 ENCOUNTER — Telehealth: Payer: Self-pay | Admitting: Sports Medicine

## 2017-07-31 NOTE — Telephone Encounter (Signed)
Phone call made to patient. Patient's husband answered and then passed the phone to her.  Patient reports that she was admitted to [facility] sent there by [facility] wound care center.  Patient states that she has been dealing with worsening infection on her legs and blisters and ulcerations and significant swelling.  Patient states that she is in the hospital for them to optimize her for possible amputation procedure once they have stabilized the infection in her legs and has gotten all of the swelling down.  Patient states that she is getting good care at [facility].  Patient is very thankful for my phone call to check on her. -Dr Marylene Land

## 2017-08-06 ENCOUNTER — Telehealth: Payer: Self-pay | Admitting: Sports Medicine

## 2017-08-06 DIAGNOSIS — I5041 Acute combined systolic (congestive) and diastolic (congestive) heart failure: Secondary | ICD-10-CM | POA: Diagnosis not present

## 2017-08-06 DIAGNOSIS — I5040 Unspecified combined systolic (congestive) and diastolic (congestive) heart failure: Secondary | ICD-10-CM | POA: Diagnosis not present

## 2017-08-06 DIAGNOSIS — R262 Difficulty in walking, not elsewhere classified: Secondary | ICD-10-CM | POA: Diagnosis not present

## 2017-08-06 DIAGNOSIS — Z9181 History of falling: Secondary | ICD-10-CM | POA: Diagnosis not present

## 2017-08-06 NOTE — Telephone Encounter (Signed)
Pt. Emily Miranda wants her to begin seeing them from now on. Pt. Will being having amputation below the knee. (This message was given by her sister)

## 2017-08-06 NOTE — Telephone Encounter (Signed)
Thanks

## 2017-08-08 DIAGNOSIS — L97411 Non-pressure chronic ulcer of right heel and midfoot limited to breakdown of skin: Secondary | ICD-10-CM | POA: Diagnosis not present

## 2017-08-08 DIAGNOSIS — E11621 Type 2 diabetes mellitus with foot ulcer: Secondary | ICD-10-CM | POA: Diagnosis not present

## 2017-08-08 DIAGNOSIS — Z9181 History of falling: Secondary | ICD-10-CM | POA: Diagnosis not present

## 2017-08-08 DIAGNOSIS — I5041 Acute combined systolic (congestive) and diastolic (congestive) heart failure: Secondary | ICD-10-CM | POA: Diagnosis not present

## 2017-08-08 DIAGNOSIS — I5040 Unspecified combined systolic (congestive) and diastolic (congestive) heart failure: Secondary | ICD-10-CM | POA: Diagnosis not present

## 2017-08-08 DIAGNOSIS — R262 Difficulty in walking, not elsewhere classified: Secondary | ICD-10-CM | POA: Diagnosis not present

## 2017-08-11 DIAGNOSIS — L97415 Non-pressure chronic ulcer of right heel and midfoot with muscle involvement without evidence of necrosis: Secondary | ICD-10-CM | POA: Diagnosis not present

## 2017-08-11 DIAGNOSIS — I872 Venous insufficiency (chronic) (peripheral): Secondary | ICD-10-CM | POA: Diagnosis not present

## 2017-08-11 DIAGNOSIS — I87331 Chronic venous hypertension (idiopathic) with ulcer and inflammation of right lower extremity: Secondary | ICD-10-CM | POA: Diagnosis not present

## 2017-08-11 DIAGNOSIS — L03115 Cellulitis of right lower limb: Secondary | ICD-10-CM | POA: Diagnosis not present

## 2017-08-11 DIAGNOSIS — E11610 Type 2 diabetes mellitus with diabetic neuropathic arthropathy: Secondary | ICD-10-CM | POA: Diagnosis not present

## 2017-08-11 DIAGNOSIS — E11621 Type 2 diabetes mellitus with foot ulcer: Secondary | ICD-10-CM | POA: Diagnosis not present

## 2017-08-11 DIAGNOSIS — L97919 Non-pressure chronic ulcer of unspecified part of right lower leg with unspecified severity: Secondary | ICD-10-CM | POA: Diagnosis not present

## 2017-08-11 DIAGNOSIS — R600 Localized edema: Secondary | ICD-10-CM | POA: Diagnosis not present

## 2017-08-11 DIAGNOSIS — I739 Peripheral vascular disease, unspecified: Secondary | ICD-10-CM | POA: Diagnosis not present

## 2017-08-11 DIAGNOSIS — I890 Lymphedema, not elsewhere classified: Secondary | ICD-10-CM | POA: Diagnosis not present

## 2017-08-12 DIAGNOSIS — L97512 Non-pressure chronic ulcer of other part of right foot with fat layer exposed: Secondary | ICD-10-CM | POA: Diagnosis not present

## 2017-08-13 DIAGNOSIS — I130 Hypertensive heart and chronic kidney disease with heart failure and stage 1 through stage 4 chronic kidney disease, or unspecified chronic kidney disease: Secondary | ICD-10-CM | POA: Diagnosis not present

## 2017-08-13 DIAGNOSIS — I5043 Acute on chronic combined systolic (congestive) and diastolic (congestive) heart failure: Secondary | ICD-10-CM | POA: Diagnosis not present

## 2017-08-13 DIAGNOSIS — Z9181 History of falling: Secondary | ICD-10-CM | POA: Diagnosis not present

## 2017-08-13 DIAGNOSIS — E1151 Type 2 diabetes mellitus with diabetic peripheral angiopathy without gangrene: Secondary | ICD-10-CM | POA: Diagnosis not present

## 2017-08-13 DIAGNOSIS — I872 Venous insufficiency (chronic) (peripheral): Secondary | ICD-10-CM | POA: Diagnosis not present

## 2017-08-13 DIAGNOSIS — Z792 Long term (current) use of antibiotics: Secondary | ICD-10-CM | POA: Diagnosis not present

## 2017-08-13 DIAGNOSIS — L03116 Cellulitis of left lower limb: Secondary | ICD-10-CM | POA: Diagnosis not present

## 2017-08-13 DIAGNOSIS — E11610 Type 2 diabetes mellitus with diabetic neuropathic arthropathy: Secondary | ICD-10-CM | POA: Diagnosis not present

## 2017-08-13 DIAGNOSIS — I214 Non-ST elevation (NSTEMI) myocardial infarction: Secondary | ICD-10-CM | POA: Diagnosis not present

## 2017-08-13 DIAGNOSIS — E1165 Type 2 diabetes mellitus with hyperglycemia: Secondary | ICD-10-CM | POA: Diagnosis not present

## 2017-08-13 DIAGNOSIS — R262 Difficulty in walking, not elsewhere classified: Secondary | ICD-10-CM | POA: Diagnosis not present

## 2017-08-13 DIAGNOSIS — Z794 Long term (current) use of insulin: Secondary | ICD-10-CM | POA: Diagnosis not present

## 2017-08-13 DIAGNOSIS — Z7902 Long term (current) use of antithrombotics/antiplatelets: Secondary | ICD-10-CM | POA: Diagnosis not present

## 2017-08-13 DIAGNOSIS — N183 Chronic kidney disease, stage 3 (moderate): Secondary | ICD-10-CM | POA: Diagnosis not present

## 2017-08-13 DIAGNOSIS — D631 Anemia in chronic kidney disease: Secondary | ICD-10-CM | POA: Diagnosis not present

## 2017-08-13 DIAGNOSIS — I2510 Atherosclerotic heart disease of native coronary artery without angina pectoris: Secondary | ICD-10-CM | POA: Diagnosis not present

## 2017-08-13 DIAGNOSIS — E1122 Type 2 diabetes mellitus with diabetic chronic kidney disease: Secondary | ICD-10-CM | POA: Diagnosis not present

## 2017-08-13 DIAGNOSIS — Z951 Presence of aortocoronary bypass graft: Secondary | ICD-10-CM | POA: Diagnosis not present

## 2017-08-15 DIAGNOSIS — Z792 Long term (current) use of antibiotics: Secondary | ICD-10-CM | POA: Diagnosis not present

## 2017-08-15 DIAGNOSIS — Z951 Presence of aortocoronary bypass graft: Secondary | ICD-10-CM | POA: Diagnosis not present

## 2017-08-15 DIAGNOSIS — Z7902 Long term (current) use of antithrombotics/antiplatelets: Secondary | ICD-10-CM | POA: Diagnosis not present

## 2017-08-15 DIAGNOSIS — I5043 Acute on chronic combined systolic (congestive) and diastolic (congestive) heart failure: Secondary | ICD-10-CM | POA: Diagnosis not present

## 2017-08-15 DIAGNOSIS — I214 Non-ST elevation (NSTEMI) myocardial infarction: Secondary | ICD-10-CM | POA: Diagnosis not present

## 2017-08-15 DIAGNOSIS — E1165 Type 2 diabetes mellitus with hyperglycemia: Secondary | ICD-10-CM | POA: Diagnosis not present

## 2017-08-15 DIAGNOSIS — I872 Venous insufficiency (chronic) (peripheral): Secondary | ICD-10-CM | POA: Diagnosis not present

## 2017-08-15 DIAGNOSIS — E11610 Type 2 diabetes mellitus with diabetic neuropathic arthropathy: Secondary | ICD-10-CM | POA: Diagnosis not present

## 2017-08-15 DIAGNOSIS — E1151 Type 2 diabetes mellitus with diabetic peripheral angiopathy without gangrene: Secondary | ICD-10-CM | POA: Diagnosis not present

## 2017-08-15 DIAGNOSIS — I130 Hypertensive heart and chronic kidney disease with heart failure and stage 1 through stage 4 chronic kidney disease, or unspecified chronic kidney disease: Secondary | ICD-10-CM | POA: Diagnosis not present

## 2017-08-15 DIAGNOSIS — E1122 Type 2 diabetes mellitus with diabetic chronic kidney disease: Secondary | ICD-10-CM | POA: Diagnosis not present

## 2017-08-15 DIAGNOSIS — I2510 Atherosclerotic heart disease of native coronary artery without angina pectoris: Secondary | ICD-10-CM | POA: Diagnosis not present

## 2017-08-15 DIAGNOSIS — N183 Chronic kidney disease, stage 3 (moderate): Secondary | ICD-10-CM | POA: Diagnosis not present

## 2017-08-15 DIAGNOSIS — R262 Difficulty in walking, not elsewhere classified: Secondary | ICD-10-CM | POA: Diagnosis not present

## 2017-08-15 DIAGNOSIS — Z9181 History of falling: Secondary | ICD-10-CM | POA: Diagnosis not present

## 2017-08-15 DIAGNOSIS — L03116 Cellulitis of left lower limb: Secondary | ICD-10-CM | POA: Diagnosis not present

## 2017-08-15 DIAGNOSIS — D631 Anemia in chronic kidney disease: Secondary | ICD-10-CM | POA: Diagnosis not present

## 2017-08-15 DIAGNOSIS — Z794 Long term (current) use of insulin: Secondary | ICD-10-CM | POA: Diagnosis not present

## 2017-08-18 DIAGNOSIS — L03116 Cellulitis of left lower limb: Secondary | ICD-10-CM | POA: Diagnosis not present

## 2017-08-18 DIAGNOSIS — I2510 Atherosclerotic heart disease of native coronary artery without angina pectoris: Secondary | ICD-10-CM | POA: Diagnosis not present

## 2017-08-18 DIAGNOSIS — E1151 Type 2 diabetes mellitus with diabetic peripheral angiopathy without gangrene: Secondary | ICD-10-CM | POA: Diagnosis not present

## 2017-08-18 DIAGNOSIS — Z792 Long term (current) use of antibiotics: Secondary | ICD-10-CM | POA: Diagnosis not present

## 2017-08-18 DIAGNOSIS — I5043 Acute on chronic combined systolic (congestive) and diastolic (congestive) heart failure: Secondary | ICD-10-CM | POA: Diagnosis not present

## 2017-08-18 DIAGNOSIS — Z7902 Long term (current) use of antithrombotics/antiplatelets: Secondary | ICD-10-CM | POA: Diagnosis not present

## 2017-08-18 DIAGNOSIS — E1122 Type 2 diabetes mellitus with diabetic chronic kidney disease: Secondary | ICD-10-CM | POA: Diagnosis not present

## 2017-08-18 DIAGNOSIS — I872 Venous insufficiency (chronic) (peripheral): Secondary | ICD-10-CM | POA: Diagnosis not present

## 2017-08-18 DIAGNOSIS — E11610 Type 2 diabetes mellitus with diabetic neuropathic arthropathy: Secondary | ICD-10-CM | POA: Diagnosis not present

## 2017-08-18 DIAGNOSIS — D631 Anemia in chronic kidney disease: Secondary | ICD-10-CM | POA: Diagnosis not present

## 2017-08-18 DIAGNOSIS — I214 Non-ST elevation (NSTEMI) myocardial infarction: Secondary | ICD-10-CM | POA: Diagnosis not present

## 2017-08-18 DIAGNOSIS — R262 Difficulty in walking, not elsewhere classified: Secondary | ICD-10-CM | POA: Diagnosis not present

## 2017-08-18 DIAGNOSIS — Z794 Long term (current) use of insulin: Secondary | ICD-10-CM | POA: Diagnosis not present

## 2017-08-18 DIAGNOSIS — Z951 Presence of aortocoronary bypass graft: Secondary | ICD-10-CM | POA: Diagnosis not present

## 2017-08-18 DIAGNOSIS — Z9181 History of falling: Secondary | ICD-10-CM | POA: Diagnosis not present

## 2017-08-18 DIAGNOSIS — N183 Chronic kidney disease, stage 3 (moderate): Secondary | ICD-10-CM | POA: Diagnosis not present

## 2017-08-18 DIAGNOSIS — I130 Hypertensive heart and chronic kidney disease with heart failure and stage 1 through stage 4 chronic kidney disease, or unspecified chronic kidney disease: Secondary | ICD-10-CM | POA: Diagnosis not present

## 2017-08-18 DIAGNOSIS — E1165 Type 2 diabetes mellitus with hyperglycemia: Secondary | ICD-10-CM | POA: Diagnosis not present

## 2017-08-19 DIAGNOSIS — E11610 Type 2 diabetes mellitus with diabetic neuropathic arthropathy: Secondary | ICD-10-CM | POA: Diagnosis not present

## 2017-08-20 DIAGNOSIS — L03116 Cellulitis of left lower limb: Secondary | ICD-10-CM | POA: Diagnosis not present

## 2017-08-20 DIAGNOSIS — E1151 Type 2 diabetes mellitus with diabetic peripheral angiopathy without gangrene: Secondary | ICD-10-CM | POA: Diagnosis not present

## 2017-08-20 DIAGNOSIS — E1122 Type 2 diabetes mellitus with diabetic chronic kidney disease: Secondary | ICD-10-CM | POA: Diagnosis not present

## 2017-08-20 DIAGNOSIS — I2510 Atherosclerotic heart disease of native coronary artery without angina pectoris: Secondary | ICD-10-CM | POA: Diagnosis not present

## 2017-08-20 DIAGNOSIS — I130 Hypertensive heart and chronic kidney disease with heart failure and stage 1 through stage 4 chronic kidney disease, or unspecified chronic kidney disease: Secondary | ICD-10-CM | POA: Diagnosis not present

## 2017-08-20 DIAGNOSIS — Z7902 Long term (current) use of antithrombotics/antiplatelets: Secondary | ICD-10-CM | POA: Diagnosis not present

## 2017-08-20 DIAGNOSIS — I214 Non-ST elevation (NSTEMI) myocardial infarction: Secondary | ICD-10-CM | POA: Diagnosis not present

## 2017-08-20 DIAGNOSIS — I872 Venous insufficiency (chronic) (peripheral): Secondary | ICD-10-CM | POA: Diagnosis not present

## 2017-08-20 DIAGNOSIS — Z794 Long term (current) use of insulin: Secondary | ICD-10-CM | POA: Diagnosis not present

## 2017-08-20 DIAGNOSIS — Z792 Long term (current) use of antibiotics: Secondary | ICD-10-CM | POA: Diagnosis not present

## 2017-08-20 DIAGNOSIS — Z9181 History of falling: Secondary | ICD-10-CM | POA: Diagnosis not present

## 2017-08-20 DIAGNOSIS — I5043 Acute on chronic combined systolic (congestive) and diastolic (congestive) heart failure: Secondary | ICD-10-CM | POA: Diagnosis not present

## 2017-08-20 DIAGNOSIS — D631 Anemia in chronic kidney disease: Secondary | ICD-10-CM | POA: Diagnosis not present

## 2017-08-20 DIAGNOSIS — E1165 Type 2 diabetes mellitus with hyperglycemia: Secondary | ICD-10-CM | POA: Diagnosis not present

## 2017-08-20 DIAGNOSIS — Z951 Presence of aortocoronary bypass graft: Secondary | ICD-10-CM | POA: Diagnosis not present

## 2017-08-20 DIAGNOSIS — R262 Difficulty in walking, not elsewhere classified: Secondary | ICD-10-CM | POA: Diagnosis not present

## 2017-08-20 DIAGNOSIS — N183 Chronic kidney disease, stage 3 (moderate): Secondary | ICD-10-CM | POA: Diagnosis not present

## 2017-08-20 DIAGNOSIS — E11610 Type 2 diabetes mellitus with diabetic neuropathic arthropathy: Secondary | ICD-10-CM | POA: Diagnosis not present

## 2017-08-21 ENCOUNTER — Telehealth: Payer: Self-pay | Admitting: Cardiology

## 2017-08-21 DIAGNOSIS — N183 Chronic kidney disease, stage 3 (moderate): Secondary | ICD-10-CM | POA: Diagnosis not present

## 2017-08-21 DIAGNOSIS — I2510 Atherosclerotic heart disease of native coronary artery without angina pectoris: Secondary | ICD-10-CM | POA: Diagnosis not present

## 2017-08-21 DIAGNOSIS — Z7902 Long term (current) use of antithrombotics/antiplatelets: Secondary | ICD-10-CM | POA: Diagnosis not present

## 2017-08-21 DIAGNOSIS — E1122 Type 2 diabetes mellitus with diabetic chronic kidney disease: Secondary | ICD-10-CM | POA: Diagnosis not present

## 2017-08-21 DIAGNOSIS — R262 Difficulty in walking, not elsewhere classified: Secondary | ICD-10-CM | POA: Diagnosis not present

## 2017-08-21 DIAGNOSIS — I872 Venous insufficiency (chronic) (peripheral): Secondary | ICD-10-CM | POA: Diagnosis not present

## 2017-08-21 DIAGNOSIS — I214 Non-ST elevation (NSTEMI) myocardial infarction: Secondary | ICD-10-CM | POA: Diagnosis not present

## 2017-08-21 DIAGNOSIS — E11610 Type 2 diabetes mellitus with diabetic neuropathic arthropathy: Secondary | ICD-10-CM | POA: Diagnosis not present

## 2017-08-21 DIAGNOSIS — L03116 Cellulitis of left lower limb: Secondary | ICD-10-CM | POA: Diagnosis not present

## 2017-08-21 DIAGNOSIS — Z794 Long term (current) use of insulin: Secondary | ICD-10-CM | POA: Diagnosis not present

## 2017-08-21 DIAGNOSIS — Z792 Long term (current) use of antibiotics: Secondary | ICD-10-CM | POA: Diagnosis not present

## 2017-08-21 DIAGNOSIS — Z951 Presence of aortocoronary bypass graft: Secondary | ICD-10-CM | POA: Diagnosis not present

## 2017-08-21 DIAGNOSIS — I5043 Acute on chronic combined systolic (congestive) and diastolic (congestive) heart failure: Secondary | ICD-10-CM | POA: Diagnosis not present

## 2017-08-21 DIAGNOSIS — D631 Anemia in chronic kidney disease: Secondary | ICD-10-CM | POA: Diagnosis not present

## 2017-08-21 DIAGNOSIS — I130 Hypertensive heart and chronic kidney disease with heart failure and stage 1 through stage 4 chronic kidney disease, or unspecified chronic kidney disease: Secondary | ICD-10-CM | POA: Diagnosis not present

## 2017-08-21 DIAGNOSIS — E1151 Type 2 diabetes mellitus with diabetic peripheral angiopathy without gangrene: Secondary | ICD-10-CM | POA: Diagnosis not present

## 2017-08-21 DIAGNOSIS — E1165 Type 2 diabetes mellitus with hyperglycemia: Secondary | ICD-10-CM | POA: Diagnosis not present

## 2017-08-21 DIAGNOSIS — Z9181 History of falling: Secondary | ICD-10-CM | POA: Diagnosis not present

## 2017-08-21 NOTE — Telephone Encounter (Signed)
Follow up   Patient called, states she will wait to hear back from staff for pre directions. No appointment needed at this time. Encounter resent to preop using standard work format.

## 2017-08-21 NOTE — Telephone Encounter (Signed)
° °  [facility] Pre-operative Risk Assessment    Request for surgical clearance:  1. What type of surgery is being performed?Above the knee Amputation   2. When is this surgery scheduled? Pending   3. What type of clearance is required (medical clearance vs. Pharmacy clearance to hold med vs. Both)?General Cardiac Clearance 4. Are there any medications that need to be held prior to surgery and how long?Pt is on Plavix,but the doctor will not stop itNo   5. Practice name and name of physician performing surgery? Dr Clide Dales   6. What is your office phone number-[phone]    7.   What is your office fax [phone]  8.   Anesthesia type (None, local, MAC, general) ? General   Emily Miranda 08/21/2017, 1:04 PM  _________________________________________________________________   (provider comments below)

## 2017-08-21 NOTE — Telephone Encounter (Signed)
Dr. Antoine Poche, pt needs AKA -- her stent was placed 11/05/2016, If needed could she come off plavix or should she wait until a year?

## 2017-08-21 NOTE — Telephone Encounter (Signed)
As been scheduled to see [name], PA-C 08/28/17 @ 2:00 Pt thanked me for my call.

## 2017-08-21 NOTE — Telephone Encounter (Signed)
I talked with pt and she has had another cath at [facility].  She will need follow up with Dr. Antoine Poche or APP for clearance.    Primary Cardiologist:No primary care provider on file.  Chart reviewed as part of pre-operative protocol coverage. Because of Emily Miranda's past medical history and time since last visit, he/she will require a follow-up visit in order to better assess preoperative cardiovascular risk.  Pre-op covering staff: - Please schedule appointment and call patient to inform them. - Please contact requesting surgeon's office via preferred method (i.e, phone, fax) to inform them of need for appointment prior to surgery.  Nada Boozer, NP  08/21/2017, 4:00 PM

## 2017-08-22 DIAGNOSIS — D631 Anemia in chronic kidney disease: Secondary | ICD-10-CM | POA: Diagnosis not present

## 2017-08-22 DIAGNOSIS — I2510 Atherosclerotic heart disease of native coronary artery without angina pectoris: Secondary | ICD-10-CM | POA: Diagnosis not present

## 2017-08-22 DIAGNOSIS — I214 Non-ST elevation (NSTEMI) myocardial infarction: Secondary | ICD-10-CM | POA: Diagnosis not present

## 2017-08-22 DIAGNOSIS — E1151 Type 2 diabetes mellitus with diabetic peripheral angiopathy without gangrene: Secondary | ICD-10-CM | POA: Diagnosis not present

## 2017-08-22 DIAGNOSIS — N183 Chronic kidney disease, stage 3 (moderate): Secondary | ICD-10-CM | POA: Diagnosis not present

## 2017-08-22 DIAGNOSIS — Z794 Long term (current) use of insulin: Secondary | ICD-10-CM | POA: Diagnosis not present

## 2017-08-22 DIAGNOSIS — Z951 Presence of aortocoronary bypass graft: Secondary | ICD-10-CM | POA: Diagnosis not present

## 2017-08-22 DIAGNOSIS — Z7902 Long term (current) use of antithrombotics/antiplatelets: Secondary | ICD-10-CM | POA: Diagnosis not present

## 2017-08-22 DIAGNOSIS — Z9181 History of falling: Secondary | ICD-10-CM | POA: Diagnosis not present

## 2017-08-22 DIAGNOSIS — E1165 Type 2 diabetes mellitus with hyperglycemia: Secondary | ICD-10-CM | POA: Diagnosis not present

## 2017-08-22 DIAGNOSIS — E1122 Type 2 diabetes mellitus with diabetic chronic kidney disease: Secondary | ICD-10-CM | POA: Diagnosis not present

## 2017-08-22 DIAGNOSIS — I5043 Acute on chronic combined systolic (congestive) and diastolic (congestive) heart failure: Secondary | ICD-10-CM | POA: Diagnosis not present

## 2017-08-22 DIAGNOSIS — R262 Difficulty in walking, not elsewhere classified: Secondary | ICD-10-CM | POA: Diagnosis not present

## 2017-08-22 DIAGNOSIS — E11610 Type 2 diabetes mellitus with diabetic neuropathic arthropathy: Secondary | ICD-10-CM | POA: Diagnosis not present

## 2017-08-22 DIAGNOSIS — I130 Hypertensive heart and chronic kidney disease with heart failure and stage 1 through stage 4 chronic kidney disease, or unspecified chronic kidney disease: Secondary | ICD-10-CM | POA: Diagnosis not present

## 2017-08-22 DIAGNOSIS — I872 Venous insufficiency (chronic) (peripheral): Secondary | ICD-10-CM | POA: Diagnosis not present

## 2017-08-22 DIAGNOSIS — Z792 Long term (current) use of antibiotics: Secondary | ICD-10-CM | POA: Diagnosis not present

## 2017-08-22 DIAGNOSIS — L03116 Cellulitis of left lower limb: Secondary | ICD-10-CM | POA: Diagnosis not present

## 2017-08-25 DIAGNOSIS — I214 Non-ST elevation (NSTEMI) myocardial infarction: Secondary | ICD-10-CM | POA: Diagnosis not present

## 2017-08-25 DIAGNOSIS — I2510 Atherosclerotic heart disease of native coronary artery without angina pectoris: Secondary | ICD-10-CM | POA: Diagnosis not present

## 2017-08-25 DIAGNOSIS — E1151 Type 2 diabetes mellitus with diabetic peripheral angiopathy without gangrene: Secondary | ICD-10-CM | POA: Diagnosis not present

## 2017-08-26 DIAGNOSIS — E1122 Type 2 diabetes mellitus with diabetic chronic kidney disease: Secondary | ICD-10-CM | POA: Diagnosis not present

## 2017-08-26 DIAGNOSIS — Z794 Long term (current) use of insulin: Secondary | ICD-10-CM | POA: Diagnosis not present

## 2017-08-27 DIAGNOSIS — N183 Chronic kidney disease, stage 3 (moderate): Secondary | ICD-10-CM | POA: Diagnosis not present

## 2017-08-27 DIAGNOSIS — E1151 Type 2 diabetes mellitus with diabetic peripheral angiopathy without gangrene: Secondary | ICD-10-CM | POA: Diagnosis not present

## 2017-08-27 DIAGNOSIS — I872 Venous insufficiency (chronic) (peripheral): Secondary | ICD-10-CM | POA: Diagnosis not present

## 2017-08-27 DIAGNOSIS — R262 Difficulty in walking, not elsewhere classified: Secondary | ICD-10-CM | POA: Diagnosis not present

## 2017-08-27 DIAGNOSIS — I130 Hypertensive heart and chronic kidney disease with heart failure and stage 1 through stage 4 chronic kidney disease, or unspecified chronic kidney disease: Secondary | ICD-10-CM | POA: Diagnosis not present

## 2017-08-27 DIAGNOSIS — D631 Anemia in chronic kidney disease: Secondary | ICD-10-CM | POA: Diagnosis not present

## 2017-08-27 DIAGNOSIS — E1122 Type 2 diabetes mellitus with diabetic chronic kidney disease: Secondary | ICD-10-CM | POA: Diagnosis not present

## 2017-08-27 DIAGNOSIS — E1165 Type 2 diabetes mellitus with hyperglycemia: Secondary | ICD-10-CM | POA: Diagnosis not present

## 2017-08-27 DIAGNOSIS — Z951 Presence of aortocoronary bypass graft: Secondary | ICD-10-CM | POA: Diagnosis not present

## 2017-08-27 DIAGNOSIS — Z792 Long term (current) use of antibiotics: Secondary | ICD-10-CM | POA: Diagnosis not present

## 2017-08-27 DIAGNOSIS — Z794 Long term (current) use of insulin: Secondary | ICD-10-CM | POA: Diagnosis not present

## 2017-08-27 DIAGNOSIS — I2510 Atherosclerotic heart disease of native coronary artery without angina pectoris: Secondary | ICD-10-CM | POA: Diagnosis not present

## 2017-08-27 DIAGNOSIS — I5043 Acute on chronic combined systolic (congestive) and diastolic (congestive) heart failure: Secondary | ICD-10-CM | POA: Diagnosis not present

## 2017-08-27 DIAGNOSIS — I214 Non-ST elevation (NSTEMI) myocardial infarction: Secondary | ICD-10-CM | POA: Diagnosis not present

## 2017-08-27 DIAGNOSIS — Z7902 Long term (current) use of antithrombotics/antiplatelets: Secondary | ICD-10-CM | POA: Diagnosis not present

## 2017-08-27 DIAGNOSIS — E11610 Type 2 diabetes mellitus with diabetic neuropathic arthropathy: Secondary | ICD-10-CM | POA: Diagnosis not present

## 2017-08-27 DIAGNOSIS — Z9181 History of falling: Secondary | ICD-10-CM | POA: Diagnosis not present

## 2017-08-27 DIAGNOSIS — L03116 Cellulitis of left lower limb: Secondary | ICD-10-CM | POA: Diagnosis not present

## 2017-08-28 ENCOUNTER — Other Ambulatory Visit: Payer: Self-pay | Admitting: Cardiology

## 2017-08-28 ENCOUNTER — Inpatient Hospital Stay (HOSPITAL_COMMUNITY)
Admission: AD | Admit: 2017-08-28 | Discharge: 2017-09-04 | DRG: 291 | Disposition: A | Discharge status: Home / Self Care | Payer: Medicare Other | Source: Ambulatory Visit | Attending: Cardiology | Admitting: Cardiology

## 2017-08-28 ENCOUNTER — Encounter: Payer: Self-pay | Admitting: Cardiology

## 2017-08-28 ENCOUNTER — Encounter (HOSPITAL_COMMUNITY): Payer: Self-pay | Admitting: General Practice

## 2017-08-28 ENCOUNTER — Ambulatory Visit (INDEPENDENT_AMBULATORY_CARE_PROVIDER_SITE_OTHER): Payer: Medicare Other | Admitting: Cardiology

## 2017-08-28 ENCOUNTER — Other Ambulatory Visit: Payer: Self-pay

## 2017-08-28 ENCOUNTER — Ambulatory Visit: Payer: Medicare Other | Admitting: Cardiology

## 2017-08-28 DIAGNOSIS — N183 Chronic kidney disease, stage 3 unspecified: Secondary | ICD-10-CM

## 2017-08-28 DIAGNOSIS — E1151 Type 2 diabetes mellitus with diabetic peripheral angiopathy without gangrene: Secondary | ICD-10-CM | POA: Diagnosis not present

## 2017-08-28 DIAGNOSIS — I43 Cardiomyopathy in diseases classified elsewhere: Secondary | ICD-10-CM | POA: Diagnosis present

## 2017-08-28 DIAGNOSIS — Z833 Family history of diabetes mellitus: Secondary | ICD-10-CM

## 2017-08-28 DIAGNOSIS — I5043 Acute on chronic combined systolic (congestive) and diastolic (congestive) heart failure: Secondary | ICD-10-CM | POA: Diagnosis not present

## 2017-08-28 DIAGNOSIS — E785 Hyperlipidemia, unspecified: Secondary | ICD-10-CM | POA: Diagnosis not present

## 2017-08-28 DIAGNOSIS — I110 Hypertensive heart disease with heart failure: Secondary | ICD-10-CM | POA: Diagnosis not present

## 2017-08-28 DIAGNOSIS — Z9861 Coronary angioplasty status: Secondary | ICD-10-CM | POA: Diagnosis not present

## 2017-08-28 DIAGNOSIS — E1122 Type 2 diabetes mellitus with diabetic chronic kidney disease: Secondary | ICD-10-CM | POA: Diagnosis present

## 2017-08-28 DIAGNOSIS — I130 Hypertensive heart and chronic kidney disease with heart failure and stage 1 through stage 4 chronic kidney disease, or unspecified chronic kidney disease: Secondary | ICD-10-CM | POA: Diagnosis not present

## 2017-08-28 DIAGNOSIS — I252 Old myocardial infarction: Secondary | ICD-10-CM | POA: Diagnosis not present

## 2017-08-28 DIAGNOSIS — Z7951 Long term (current) use of inhaled steroids: Secondary | ICD-10-CM

## 2017-08-28 DIAGNOSIS — Z8249 Family history of ischemic heart disease and other diseases of the circulatory system: Secondary | ICD-10-CM | POA: Diagnosis not present

## 2017-08-28 DIAGNOSIS — Z90710 Acquired absence of both cervix and uterus: Secondary | ICD-10-CM

## 2017-08-28 DIAGNOSIS — E1159 Type 2 diabetes mellitus with other circulatory complications: Secondary | ICD-10-CM | POA: Diagnosis not present

## 2017-08-28 DIAGNOSIS — Z01818 Encounter for other preprocedural examination: Secondary | ICD-10-CM | POA: Insufficient documentation

## 2017-08-28 DIAGNOSIS — Z888 Allergy status to other drugs, medicaments and biological substances status: Secondary | ICD-10-CM | POA: Diagnosis not present

## 2017-08-28 DIAGNOSIS — Z886 Allergy status to analgesic agent status: Secondary | ICD-10-CM

## 2017-08-28 DIAGNOSIS — E11649 Type 2 diabetes mellitus with hypoglycemia without coma: Secondary | ICD-10-CM | POA: Diagnosis not present

## 2017-08-28 DIAGNOSIS — I2510 Atherosclerotic heart disease of native coronary artery without angina pectoris: Secondary | ICD-10-CM | POA: Diagnosis present

## 2017-08-28 DIAGNOSIS — I5041 Acute combined systolic (congestive) and diastolic (congestive) heart failure: Secondary | ICD-10-CM | POA: Diagnosis not present

## 2017-08-28 DIAGNOSIS — Z7902 Long term (current) use of antithrombotics/antiplatelets: Secondary | ICD-10-CM | POA: Diagnosis not present

## 2017-08-28 DIAGNOSIS — Z955 Presence of coronary angioplasty implant and graft: Secondary | ICD-10-CM | POA: Diagnosis not present

## 2017-08-28 DIAGNOSIS — Z794 Long term (current) use of insulin: Secondary | ICD-10-CM | POA: Diagnosis not present

## 2017-08-28 DIAGNOSIS — Z881 Allergy status to other antibiotic agents status: Secondary | ICD-10-CM | POA: Diagnosis not present

## 2017-08-28 DIAGNOSIS — E11610 Type 2 diabetes mellitus with diabetic neuropathic arthropathy: Secondary | ICD-10-CM

## 2017-08-28 DIAGNOSIS — N179 Acute kidney failure, unspecified: Secondary | ICD-10-CM | POA: Diagnosis not present

## 2017-08-28 DIAGNOSIS — E119 Type 2 diabetes mellitus without complications: Secondary | ICD-10-CM | POA: Diagnosis not present

## 2017-08-28 HISTORY — DX: Migraine, unspecified, not intractable, without status migrainosus: G43.909

## 2017-08-28 HISTORY — DX: Personal history of peptic ulcer disease: Z87.11

## 2017-08-28 HISTORY — DX: Personal history of other diseases of the digestive system: Z87.19

## 2017-08-28 HISTORY — DX: Non-ST elevation (NSTEMI) myocardial infarction: I21.4

## 2017-08-28 HISTORY — DX: Type 2 diabetes mellitus without complications: E11.9

## 2017-08-28 HISTORY — DX: Pneumonia, unspecified organism: J18.9

## 2017-08-28 HISTORY — DX: Personal history of other medical treatment: Z92.89

## 2017-08-28 HISTORY — DX: Personal history of urinary calculi: Z87.442

## 2017-08-28 HISTORY — DX: Type 2 diabetes mellitus with diabetic polyneuropathy: E11.42

## 2017-08-28 LAB — CBC WITH DIFFERENTIAL/PLATELET
Basophils Absolute: 0 10*3/uL (ref 0.0–0.1)
Basophils Relative: 0 %
Eosinophils Absolute: 0.4 10*3/uL (ref 0.0–0.7)
Eosinophils Relative: 7 %
HCT: 25 % — ABNORMAL LOW (ref 36.0–46.0)
Hemoglobin: 7.7 g/dL — ABNORMAL LOW (ref 12.0–15.0)
Lymphocytes Relative: 20 %
Lymphs Abs: 1.2 10*3/uL (ref 0.7–4.0)
MCH: 27.1 pg (ref 26.0–34.0)
MCHC: 30.8 g/dL (ref 30.0–36.0)
MCV: 88 fL (ref 78.0–100.0)
Monocytes Absolute: 0.4 10*3/uL (ref 0.1–1.0)
Monocytes Relative: 6 %
Neutro Abs: 4.1 10*3/uL (ref 1.7–7.7)
Neutrophils Relative %: 67 %
Platelets: 231 10*3/uL (ref 150–400)
RBC: 2.84 MIL/uL — ABNORMAL LOW (ref 3.87–5.11)
RDW: 15.3 % (ref 11.5–15.5)
WBC: 6.1 10*3/uL (ref 4.0–10.5)

## 2017-08-28 LAB — COMPREHENSIVE METABOLIC PANEL
ALT: 13 U/L — ABNORMAL LOW (ref 14–54)
AST: 14 U/L — ABNORMAL LOW (ref 15–41)
Albumin: 2.5 g/dL — ABNORMAL LOW (ref 3.5–5.0)
Alkaline Phosphatase: 91 U/L (ref 38–126)
Anion gap: 9 (ref 5–15)
BUN: 29 mg/dL — ABNORMAL HIGH (ref 6–20)
CO2: 16 mmol/L — ABNORMAL LOW (ref 22–32)
Calcium: 8.4 mg/dL — ABNORMAL LOW (ref 8.9–10.3)
Chloride: 116 mmol/L — ABNORMAL HIGH (ref 101–111)
Creatinine, Ser: 1.71 mg/dL — ABNORMAL HIGH (ref 0.44–1.00)
GFR calc Af Amer: 33 mL/min — ABNORMAL LOW (ref >60)
GFR calc non Af Amer: 28 mL/min — ABNORMAL LOW (ref >60)
Glucose, Bld: 252 mg/dL — ABNORMAL HIGH (ref 65–99)
Potassium: 4.4 mmol/L (ref 3.5–5.1)
Sodium: 141 mmol/L (ref 135–145)
Total Bilirubin: 0.5 mg/dL (ref 0.3–1.2)
Total Protein: 5.6 g/dL — ABNORMAL LOW (ref 6.5–8.1)

## 2017-08-28 LAB — GLUCOSE, CAPILLARY: Glucose-Capillary: 229 mg/dL — ABNORMAL HIGH (ref 65–99)

## 2017-08-28 LAB — BRAIN NATRIURETIC PEPTIDE: B Natriuretic Peptide: 2542.7 pg/mL — ABNORMAL HIGH (ref 0.0–100.0)

## 2017-08-28 MED ORDER — HYDROCODONE-ACETAMINOPHEN 5-325 MG PO TABS
1.0000 | ORAL_TABLET | Freq: Four times a day (QID) | ORAL | Status: DC | PRN
  Administered 2017-08-28 – 2017-09-04 (×14): 1 via ORAL
  Filled 2017-08-28 (×14): qty 1

## 2017-08-28 MED ORDER — INSULIN ASPART 100 UNIT/ML ~~LOC~~ SOLN
0.0000 [IU] | Freq: Three times a day (TID) | SUBCUTANEOUS | Status: DC
  Administered 2017-08-29 – 2017-09-02 (×5): 2 [IU] via SUBCUTANEOUS
  Administered 2017-09-03: 3 [IU] via SUBCUTANEOUS
  Administered 2017-09-03: 2 [IU] via SUBCUTANEOUS

## 2017-08-28 MED ORDER — CYCLOBENZAPRINE HCL 10 MG PO TABS
10.0000 mg | ORAL_TABLET | Freq: Three times a day (TID) | ORAL | Status: DC
  Administered 2017-08-29 – 2017-09-04 (×19): 10 mg via ORAL
  Filled 2017-08-28 (×20): qty 1

## 2017-08-28 MED ORDER — INSULIN ASPART 100 UNIT/ML ~~LOC~~ SOLN
0.0000 [IU] | Freq: Every day | SUBCUTANEOUS | Status: DC
  Administered 2017-08-28: 2 [IU] via SUBCUTANEOUS

## 2017-08-28 MED ORDER — HYDRALAZINE HCL 25 MG PO TABS
25.0000 mg | ORAL_TABLET | Freq: Three times a day (TID) | ORAL | Status: DC
Start: 2017-08-28 — End: 2017-09-04
  Administered 2017-08-28 – 2017-09-04 (×20): 25 mg via ORAL
  Filled 2017-08-28 (×21): qty 1

## 2017-08-28 MED ORDER — CARVEDILOL 6.25 MG PO TABS
6.2500 mg | ORAL_TABLET | Freq: Two times a day (BID) | ORAL | Status: DC
  Administered 2017-08-28: 6.25 mg via ORAL
  Filled 2017-08-28: qty 1

## 2017-08-28 MED ORDER — LORATADINE 10 MG PO TABS
10.0000 mg | ORAL_TABLET | Freq: Every day | ORAL | Status: DC
  Administered 2017-08-29 – 2017-09-04 (×7): 10 mg via ORAL
  Filled 2017-08-28 (×8): qty 1

## 2017-08-28 MED ORDER — INSULIN GLARGINE 100 UNIT/ML ~~LOC~~ SOLN
30.0000 [IU] | Freq: Every day | SUBCUTANEOUS | Status: DC
  Administered 2017-08-28 – 2017-08-29 (×2): 30 [IU] via SUBCUTANEOUS
  Filled 2017-08-28 (×2): qty 0.3

## 2017-08-28 MED ORDER — ASPIRIN EC 81 MG PO TBEC: 81.0000 mg | DELAYED_RELEASE_TABLET | Freq: Every day | ORAL | Status: DC

## 2017-08-28 MED ORDER — ALPRAZOLAM 0.25 MG PO TABS: 0.2500 mg | ORAL_TABLET | Freq: Two times a day (BID) | ORAL | Status: DC | PRN

## 2017-08-28 MED ORDER — GLIMEPIRIDE 4 MG PO TABS
2.0000 mg | ORAL_TABLET | Freq: Every day | ORAL | Status: DC
  Administered 2017-08-29 – 2017-09-02 (×3): 2 mg via ORAL
  Filled 2017-08-28 (×3): qty 1

## 2017-08-28 MED ORDER — ATORVASTATIN CALCIUM 40 MG PO TABS
40.0000 mg | ORAL_TABLET | Freq: Every day | ORAL | Status: DC
  Administered 2017-08-29 – 2017-09-03 (×6): 40 mg via ORAL
  Filled 2017-08-28 (×7): qty 1

## 2017-08-28 MED ORDER — POTASSIUM CHLORIDE CRYS ER 20 MEQ PO TBCR
20.0000 meq | EXTENDED_RELEASE_TABLET | Freq: Every day | ORAL | Status: DC
  Administered 2017-08-29 – 2017-09-04 (×7): 20 meq via ORAL
  Filled 2017-08-28 (×8): qty 1

## 2017-08-28 MED ORDER — PANTOPRAZOLE SODIUM 40 MG PO TBEC
40.0000 mg | DELAYED_RELEASE_TABLET | Freq: Every day | ORAL | Status: DC
  Administered 2017-08-29 – 2017-09-04 (×6): 40 mg via ORAL
  Filled 2017-08-28 (×4): qty 1

## 2017-08-28 MED ORDER — ONDANSETRON HCL 4 MG/2ML IJ SOLN: 4.0000 mg | Freq: Four times a day (QID) | INTRAMUSCULAR | Status: DC | PRN

## 2017-08-28 MED ORDER — SILVER SULFADIAZINE 1 % EX CREA
TOPICAL_CREAM | Freq: Every day | CUTANEOUS | Status: DC
  Administered 2017-08-29 – 2017-09-03 (×4): via TOPICAL
  Filled 2017-08-28: qty 85

## 2017-08-28 MED ORDER — ENOXAPARIN SODIUM 40 MG/0.4ML ~~LOC~~ SOLN
40.0000 mg | SUBCUTANEOUS | Status: DC
  Administered 2017-08-28 – 2017-08-30 (×3): 40 mg via SUBCUTANEOUS
  Filled 2017-08-28 (×3): qty 0.4

## 2017-08-28 MED ORDER — LISINOPRIL 20 MG PO TABS
30.0000 mg | ORAL_TABLET | Freq: Every day | ORAL | Status: DC
  Administered 2017-08-29 – 2017-09-04 (×7): 30 mg via ORAL
  Filled 2017-08-28 (×8): qty 1

## 2017-08-28 MED ORDER — SODIUM CHLORIDE 0.9% FLUSH
3.0000 mL | INTRAVENOUS | Status: DC | PRN
  Administered 2017-08-30 – 2017-08-31 (×2): 3 mL via INTRAVENOUS
  Filled 2017-08-28 (×2): qty 3

## 2017-08-28 MED ORDER — FUROSEMIDE 10 MG/ML IJ SOLN
40.0000 mg | Freq: Two times a day (BID) | INTRAMUSCULAR | Status: DC
  Administered 2017-08-28: 40 mg via INTRAVENOUS
  Filled 2017-08-28: qty 4

## 2017-08-28 MED ORDER — ACETAMINOPHEN 325 MG PO TABS
650.0000 mg | ORAL_TABLET | ORAL | Status: DC | PRN
  Administered 2017-09-01 – 2017-09-02 (×3): 650 mg via ORAL
  Filled 2017-08-28: qty 2

## 2017-08-28 MED ORDER — NITROGLYCERIN 0.4 MG SL SUBL
0.4000 mg | SUBLINGUAL_TABLET | SUBLINGUAL | Status: DC | PRN
  Administered 2017-09-02 (×2): 0.4 mg via SUBLINGUAL

## 2017-08-28 MED ORDER — CLOPIDOGREL BISULFATE 75 MG PO TABS
75.0000 mg | ORAL_TABLET | Freq: Every day | ORAL | Status: DC
  Administered 2017-08-29 – 2017-09-04 (×7): 75 mg via ORAL
  Filled 2017-08-28 (×7): qty 1

## 2017-08-28 MED ORDER — POLYETHYLENE GLYCOL 3350 17 G PO PACK
17.0000 g | PACK | Freq: Every day | ORAL | Status: DC
  Administered 2017-08-29: 17 g via ORAL

## 2017-08-28 MED ORDER — ISOSORBIDE MONONITRATE ER 30 MG PO TB24
30.0000 mg | ORAL_TABLET | Freq: Every day | ORAL | Status: DC
  Administered 2017-08-29 – 2017-09-04 (×7): 30 mg via ORAL
  Filled 2017-08-28 (×8): qty 1

## 2017-08-28 MED ORDER — LINAGLIPTIN 5 MG PO TABS
5.0000 mg | ORAL_TABLET | Freq: Every day | ORAL | Status: DC
  Administered 2017-08-29 – 2017-09-04 (×6): 5 mg via ORAL
  Filled 2017-08-28 (×7): qty 1

## 2017-08-28 NOTE — Assessment & Plan Note (Signed)
Moderate 3V disease at cath 10/11/16- medical Rx Recurrent chest pain-- RCA PCI with DES 11/07/17 Pt had an LAD PCI with DES and a CFX PCI with DES at [facility] 08/01/17

## 2017-08-28 NOTE — Assessment & Plan Note (Signed)
Last SCr 1.7 

## 2017-08-28 NOTE — Assessment & Plan Note (Signed)
Pt has grade 2 DD, moderate AS, moderate LVH, and EF of 40-45% 

## 2017-08-28 NOTE — Progress Notes (Signed)
08/28/2017 Emily Miranda   12/23/1940  [id number]  Primary Physician Noni Saupe, MD Primary Cardiologist: Dr Antoine Poche  HPI:  77 y.o.femalewith history of mild to moderate aortic stenosis, EF 40-45%,  HCVD-grade 2 DD, IDDM with Charcot chronic Rt foot wound, and CRI-3. She was admitted 10/06/16 with a NSTEMI. Her course was complicated by CHF requiring several days diuresis (her cath was delayed secondary to persistent CHF), anemia requiring transfusion, and acute on CRI. She ultimately underwent cath 10/11/16 which showed moderate, diffuse3v disease. The plan wasfor medical therapy. She was seen in follow up 10/31/16. Mobility was a big problem, her Rt foot was unusable and she couldn't walk with PT. She has know Rt SFA disease but the benefit  of intervention vs risk was not clear then as her Rt foot ulcer was healing.   She then presented to [facility] 11/04/16 with chest pain and a positive Troponin. On 11/07/16 she underwent intervention to two sites in her RCA with DES by Dr Tresa Endo. She had chest pain post PCI but her Troponin was flat and no further intervention was planned. She was noted to be volume overloaded on f/u in July and Zaroxolyn 2.5 mg 3 times a week was added. In Jan 2019 shje was admitted with AKI and her diuretic were backed off. We have not seen her since.  She has been followed at [facility] and was admitted 08/01/17 and had two DES placed. She has been seen by an orthopedist there and apparently she needs a Rt AKA. She is referred to [place] for per op clearance. The pt complains of swelling in her thighs and abdomin. Her weight is 160 lbs, last office weight in Jan was 142 lbs.     Current Outpatient Medications  Medication Sig Dispense Refill  . Ascorbic Acid (VITAMIN C) 1000 MG tablet Take 1,000 mg by mouth daily.    Marland Kitchen aspirin-acetaminophen-caffeine (EXCEDRIN MIGRAINE) 250-250-65 MG tablet Take 1 tablet by mouth every 6 (six) hours as needed for headache.    Marland Kitchen atorvastatin  (LIPITOR) 40 MG tablet Take 1 tablet (40 mg total) by mouth daily at 6 PM. 30 tablet 3  . carvedilol (COREG) 6.25 MG tablet Take 6.25 mg by mouth 2 (two) times daily with a meal.    . clopidogrel (PLAVIX) 75 MG tablet Take 1 tablet (75 mg total) by mouth daily with breakfast. 30 tablet 6  . collagenase (SANTYL) ointment Apply topically daily. Apply to LE plantar surface daily. 15 g 0  . cyclobenzaprine (FLEXERIL) 5 MG tablet Take 10 mg by mouth 3 (three) times daily.   0  . diclofenac sodium (VOLTAREN) 1 % GEL Apply 2 g topically 4 (four) times daily. Please apply to let hip. 1 Tube 0  . fexofenadine (ALLEGRA) 180 MG tablet Take 180 mg by mouth daily.    . fluticasone (FLONASE) 50 MCG/ACT nasal spray Place 1 spray into both nostrils daily as needed (seasonal allergies).     . furosemide (LASIX) 40 MG tablet Take 0.5 tablets (20 mg total) by mouth daily as needed for fluid (wieght gain 2lbs in 24 hours, or 5 lbs in 7 days.). 20 tablet 0  . glimepiride (AMARYL) 2 MG tablet Take 4 mg by mouth daily with breakfast.     . hydrALAZINE (APRESOLINE) 25 MG tablet Take 25 mg by mouth every 8 (eight) hours.    Marland Kitchen HYDROcodone-acetaminophen (NORCO/VICODIN) 5-325 MG tablet Take 1 tablet by mouth every 6 (six) hours as needed  for moderate pain. 10 tablet 0  . isosorbide mononitrate (IMDUR) 30 MG 24 hr tablet Take 1 tablet (30 mg total) by mouth daily. 30 tablet 6  . LANTUS SOLOSTAR 100 UNIT/ML Solostar Pen Inject 30 Units into the skin at bedtime.  2  . linagliptin (TRADJENTA) 5 MG TABS tablet Take 5 mg by mouth daily.    Marland Kitchen lisinopril (PRINIVIL,ZESTRIL) 30 MG tablet Take 30 mg by mouth daily.    . mupirocin cream (BACTROBAN) 2 % Apply 1 application topically 2 (two) times daily. 15 g 0  . nitroGLYCERIN (NITROSTAT) 0.4 MG SL tablet Place 1 tablet (0.4 mg total) under the tongue every 5 (five) minutes x 3 doses as needed for chest pain. 25 tablet 3  . pantoprazole (PROTONIX) 40 MG tablet Take 1 tablet (40 mg total)  by mouth daily. 90 tablet 1  . Polyethyl Glycol-Propyl Glycol (SYSTANE OP) Place 1 drop into both eyes daily as needed (dry eyes).    . polyethylene glycol (MIRALAX / GLYCOLAX) packet Take 17 g by mouth daily as needed (constipation). Mix in 8 oz liquid and drink    . silver sulfADIAZINE (SILVADENE) 1 % cream Apply topically daily. Apply to right lower extremities affected areas, not to the plantar region 50 g 0   No current facility-administered medications for this visit.     Allergies  Allergen Reactions  . Levofloxacin Nausea And Vomiting and Other (See Comments)    Confusion  . Amlodipine Other (See Comments)    Localized edema  . Gabapentin Other (See Comments)    Disoriented, no strength in legs  . Hydrochlorothiazide Other (See Comments)    Hot and disoriented  . Ibuprofen Other (See Comments)    Affected kidneys - stopped flow of urine Reaction to Advil  . Metformin And Related Other (See Comments)    Affected kidneys - stopped flow of urine  . Simvastatin Other (See Comments)    Chest pain    Past Medical History:  Diagnosis Date  . AKI (acute kidney injury) (HCC)   . Aortic stenosis, mild   . CAD (coronary artery disease)    a. 09/2016 NSTEMI/Cath: RCA 70p/m, RPDA 95ost-->Med Rx;  b. 10/2016 PCI: RCA 70p/m (2.25 x 15 Resolute Onyx DES), RPDA 95ost (2.0 x 12 Resolute Onyx DES).  . Charcot foot due to diabetes mellitus (HCC)   . Chronic combined systolic (congestive) and diastolic (congestive) heart failure (HCC)    a. 07/2016 Echo: EF 40-45%, Gr2 DD, mild AS, triv AI, mild MR, mod dil LA.  . Diabetes mellitus   . HTN (hypertension)   . Hyperlipidemia   . Myocardial infarction (HCC)   . Neuropathy   . Pancreatitis   . Physical deconditioning    a. W/C bound.  . Stasis dermatitis of both legs 05/2017    Social History   Socioeconomic History  . Marital status: Married    Spouse name: Not on file  . Number of children: Not on file  . Years of education: Not on  file  . Highest education level: Not on file  Occupational History  . Not on file  Social Needs  . Financial resource strain: Not on file  . Food insecurity:    Worry: Not on file    Inability: Not on file  . Transportation needs:    Medical: Not on file    Non-medical: Not on file  Tobacco Use  . Smoking status: Never Smoker  . Smokeless tobacco: Never Used  Substance and Sexual Activity  . Alcohol use: No  . Drug use: No  . Sexual activity: Never  Lifestyle  . Physical activity:    Days per week: Not on file    Minutes per session: Not on file  . Stress: Not on file  Relationships  . Social connections:    Talks on phone: Not on file    Gets together: Not on file    Attends religious service: Not on file    Active member of club or organization: Not on file    Attends meetings of clubs or organizations: Not on file    Relationship status: Not on file  . Intimate partner violence:    Fear of current or ex partner: Not on file    Emotionally abused: Not on file    Physically abused: Not on file    Forced sexual activity: Not on file  Other Topics Concern  . Not on file  Social History Narrative  . Not on file     Family History  Problem Relation Age of Onset  . Chronic Renal Failure Mother   . Diabetes Father   . Heart attack Father 24     Review of Systems: General: negative for chills, fever, night sweats or weight changes.  Cardiovascular: negative for chest pain, dyspnea on exertion, edema, orthopnea, palpitations, paroxysmal nocturnal dyspnea or shortness of breath Dermatological: negative for rash Respiratory: negative for cough or wheezing Urologic: negative for hematuria Abdominal: negative for nausea, vomiting, diarrhea, bright red blood per rectum, melena, or hematemesis Neurologic: negative for visual changes, syncope, or dizziness All other systems reviewed and are otherwise negative except as noted above.    Blood pressure 128/62, pulse 61,  height 5\' 6"  (1.676 m), weight 160 lb (72.6 kg).  General appearance: alert, cooperative, appears older than stated age, no distress, mildly obese and in a wheel chair Neck: JVD noted Lungs: few basilar rales Heart: regular rate and rhythm and 2/6 systolic murmur Extremities: both LE wrapped. both thighs have firm pitting edema Skin: pale, cool, dry Neurologic: Grossly normal  EKG NSR,  Q V2, 1st degree AVB  ASSESSMENT AND PLAN:   Acute combined systolic and diastolic congestive heart failure (HCC) Pt c/o SOB, increased weight, thigh edema  CAD S/P percutaneous coronary angioplasty Moderate 3V disease at cath 10/11/16- medical Rx Recurrent chest pain-- RCA PCI with DES 11/07/17 Pt had an LAD PCI with DES and a CFX PCI with DES at [facility] 08/01/17  Pre-operative clearance Pt sent to see [place] today for pre op (amputation) cardiac clearance prior to surgery to be done at [facility].  Hypertensive cardiomyopathy, with heart failure (HCC) Pt has grade 2 DD, moderate AS, moderate LVH, and EF of 40-45%  Charcot foot due to diabetes mellitus (HCC) Bilateral leg wounds- followed at [facility]. She needs Rt AKA  CKD (chronic kidney disease), stage III (HCC) Last SCr 1.7   PLAN  I think we should admit her for IV diuresis. She has trouble getting to the BR at home and will need significant diuresis.   [name] PA-C 08/28/2017 3:09 PM

## 2017-08-28 NOTE — Assessment & Plan Note (Addendum)
Bilateral leg wounds- followed at [facility]. She needs Rt AKA

## 2017-08-28 NOTE — Assessment & Plan Note (Signed)
Pt sent to see [place] today for pre op (amputation) cardiac clearance prior to surgery to be done at [facility].

## 2017-08-28 NOTE — Assessment & Plan Note (Signed)
Pt c/o SOB, increased weight, thigh edema

## 2017-08-28 NOTE — H&P (Addendum)
Cardiology Admission History and Physical:   Patient ID: Emily Miranda; MRN: [id number]; DOB: 07/28/1940   Admission date: (Not on file)  Primary Care Provider: Noni Saupe, MD Primary Cardiologist: Dr Antoine Poche  Chief Complaint:  SOB, edema  Patient Profile:   Emily Miranda is a 77 y.o. female with a history of  Chronic combined systolic and diastolic CHF who was admitted from the office with CHF.  History of Present Illness:   [name] is a 77 y.o.femalewith history of mild to moderate aortic stenosis, EF 40-45%, HCVD-grade 2 DD, IDDMwith Charcot chronic Rt foot wound, and CRI-3. She was admitted 10/06/16 with a NSTEMI. Her course was complicated by CHF requiring several days diuresis (her cath was delayed secondary to persistent CHF), anemia requiring transfusion, and acute on CRI. She ultimately underwent cath 10/11/16 which showed moderate, diffuse3v disease. The planwasfor medical therapy.She was seen in follow up 10/31/16. Mobility was a big problem, her Rt foot was unusable and she couldn't walk with PT. She has know Rt SFA disease but the benefit of intervention vs risk was not clear then as her Rt foot ulcer was healing.   She then presented to [name] 11/04/16 with chest pain and a positive Troponin. On 11/07/16 she underwent intervention to two sites in her RCA with DES by Dr Tresa Endo. She had chest pain post PCI but her Troponin was flat and no further intervention was planned. She was noted to be volume overloaded on f/u in July and Zaroxolyn 2.5 mg 3 times a week was added. In Jan 2019 shje was admitted with AKI and her diuretic were backed off. We have not seen her since.  She has been followed at [facility] and was admitted 08/01/17 and had two DES placed. She has been seen by an orthopedist there and apparently she needs a Rt AKA. She is referred to [place] for per op clearance. The pt complains of swelling in her thighs and abdomin. Her weight is 160 lbs, last office  weight in Jan was 142 lbs.    Past Medical History:  Diagnosis Date  . AKI (acute kidney injury) (HCC)   . Aortic stenosis, mild   . CAD (coronary artery disease)    a. 09/2016 NSTEMI/Cath: RCA 70p/m, RPDA 95ost-->Med Rx;  b. 10/2016 PCI: RCA 70p/m (2.25 x 15 Resolute Onyx DES), RPDA 95ost (2.0 x 12 Resolute Onyx DES).  . Charcot foot due to diabetes mellitus (HCC)   . Chronic combined systolic (congestive) and diastolic (congestive) heart failure (HCC)    a. 07/2016 Echo: EF 40-45%, Gr2 DD, mild AS, triv AI, mild MR, mod dil LA.  . Diabetes mellitus   . HTN (hypertension)   . Hyperlipidemia   . Myocardial infarction (HCC)   . Neuropathy   . Pancreatitis   . Physical deconditioning    a. W/C bound.  . Stasis dermatitis of both legs 05/2017    Past Surgical History:  Procedure Laterality Date  . ABDOMINAL HYSTERECTOMY    . APPENDECTOMY    . CORONARY STENT INTERVENTION N/A 11/05/2016   Procedure: Coronary Stent Intervention;  Surgeon: Lennette Bihari, MD;  Location: [facility] INVASIVE CV LAB;  Service: Cardiovascular;  Laterality: N/A;  . FOOT SURGERY    . LAPAROSCOPIC INCISIONAL / UMBILICAL / VENTRAL HERNIA REPAIR  01/04/2008   Dr Bertram Savin  . LAPAROSCOPIC LYSIS OF ADHESIONS  2007   Dr Donovan Kail  . LEFT HEART CATH AND CORONARY ANGIOGRAPHY N/A 10/11/2016   Procedure:  Left Heart Cath and Coronary Angiography;  Surgeon: Yvonne Kendall, MD;  Location: [facility] INVASIVE CV LAB;  Service: Cardiovascular;  Laterality: N/A;  . OVARIAN CYST REMOVAL    . ROBOT ASSISTED PYELOPLASTY  02/2007   Dr Laverle Patter  . SUTURE REMOVAL  08/17/2009   Dr Bertram Savin .  Right paramedian GoreTex stitch  . TONSILLECTOMY       Medications Prior to Admission: Prior to Admission medications   Medication Sig Start Date End Date Taking? Authorizing Provider  Ascorbic Acid (VITAMIN C) 1000 MG tablet Take 1,000 mg by mouth daily.    [provider]  aspirin-acetaminophen-caffeine (EXCEDRIN MIGRAINE) [phone] MG  tablet Take 1 tablet by mouth every 6 (six) hours as needed for headache.    [provider]  atorvastatin (LIPITOR) 40 MG tablet Take 1 tablet (40 mg total) by mouth daily at 6 PM. 08/05/16   Garth Bigness, MD  carvedilol (COREG) 6.25 MG tablet Take 6.25 mg by mouth 2 (two) times daily with a meal.    [provider]  clopidogrel (PLAVIX) 75 MG tablet Take 1 tablet (75 mg total) by mouth daily with breakfast. 11/10/16   Creig Hines, NP  collagenase (SANTYL) ointment Apply topically daily. Apply to LE plantar surface daily. 06/04/17   Arrien, York Ram, MD  cyclobenzaprine (FLEXERIL) 5 MG tablet Take 10 mg by mouth 3 (three) times daily.  10/16/16   [provider]  diclofenac sodium (VOLTAREN) 1 % GEL Apply 2 g topically 4 (four) times daily. Please apply to let hip. 06/03/17   Arrien, York Ram, MD  fexofenadine (ALLEGRA) 180 MG tablet Take 180 mg by mouth daily.    [provider]  fluticasone (FLONASE) 50 MCG/ACT nasal spray Place 1 spray into both nostrils daily as needed (seasonal allergies).  02/05/16   [provider]  furosemide (LASIX) 40 MG tablet Take 0.5 tablets (20 mg total) by mouth daily as needed for fluid (wieght gain 2lbs in 24 hours, or 5 lbs in 7 days.). 06/03/17   Arrien, York Ram, MD  glimepiride (AMARYL) 2 MG tablet Take 4 mg by mouth daily with breakfast.     [provider]  hydrALAZINE (APRESOLINE) 25 MG tablet Take 25 mg by mouth every 8 (eight) hours.    [provider]  HYDROcodone-acetaminophen (NORCO/VICODIN) 5-325 MG tablet Take 1 tablet by mouth every 6 (six) hours as needed for moderate pain. 06/03/17   Arrien, York Ram, MD  isosorbide mononitrate (IMDUR) 30 MG 24 hr tablet Take 1 tablet (30 mg total) by mouth daily. 11/09/16   Creig Hines, NP  LANTUS SOLOSTAR 100 UNIT/ML Solostar Pen Inject 30 Units into the skin at bedtime. 05/18/17   [provider]  linagliptin (TRADJENTA) 5 MG TABS tablet Take 5 mg by mouth daily.    [provider]  lisinopril (PRINIVIL,ZESTRIL) 30 MG tablet Take 30 mg by mouth daily.    [provider]  mupirocin cream (BACTROBAN) 2 % Apply 1 application topically 2 (two) times daily. 05/23/17   Asencion Islam, DPM  nitroGLYCERIN (NITROSTAT) 0.4 MG SL tablet Place 1 tablet (0.4 mg total) under the tongue every 5 (five) minutes x 3 doses as needed for chest pain. 11/09/16   Creig Hines, NP  pantoprazole (PROTONIX) 40 MG tablet Take 1 tablet (40 mg total) by mouth daily. 07/04/17   Rollene Rotunda, MD  Polyethyl Glycol-Propyl Glycol (SYSTANE OP) Place 1 drop into both eyes daily as needed (dry  eyes).    [provider]  polyethylene glycol (MIRALAX / GLYCOLAX) packet Take 17 g by mouth daily as needed (constipation). Mix in 8 oz liquid and drink    [provider]  silver sulfADIAZINE (SILVADENE) 1 % cream Apply topically daily. Apply to right lower extremities affected areas, not to the plantar region 06/04/17   Arrien, York Ram, MD     Allergies:    Allergies  Allergen Reactions  . Levofloxacin Nausea And Vomiting and Other (See Comments)    Confusion  . Amlodipine Other (See Comments)    Localized edema  . Gabapentin Other (See Comments)    Disoriented, no strength in legs  . Hydrochlorothiazide Other (See Comments)    Hot and disoriented  . Ibuprofen Other (See Comments)    Affected kidneys - stopped flow of urine Reaction to Advil  . Metformin And Related Other (See Comments)    Affected kidneys - stopped flow of urine  . Simvastatin Other (See Comments)    Chest pain    Social History:   Social History   Socioeconomic History  . Marital status: Married    Spouse name: Not on file  . Number of children: Not on file  . Years of education: Not on file  . Highest education level: Not on file  Occupational History  . Not on file  Social Needs    . Financial resource strain: Not on file  . Food insecurity:    Worry: Not on file    Inability: Not on file  . Transportation needs:    Medical: Not on file    Non-medical: Not on file  Tobacco Use  . Smoking status: Never Smoker  . Smokeless tobacco: Never Used  Substance and Sexual Activity  . Alcohol use: No  . Drug use: No  . Sexual activity: Never  Lifestyle  . Physical activity:    Days per week: Not on file    Minutes per session: Not on file  . Stress: Not on file  Relationships  . Social connections:    Talks on phone: Not on file    Gets together: Not on file    Attends religious service: Not on file    Active member of club or organization: Not on file    Attends meetings of clubs or organizations: Not on file    Relationship status: Not on file  . Intimate partner violence:    Fear of current or ex partner: Not on file    Emotionally abused: Not on file    Physically abused: Not on file    Forced sexual activity: Not on file  Other Topics Concern  . Not on file  Social History Narrative  . Not on file    Family History:   The patient's family history includes Chronic Renal Failure in her mother; Diabetes in her father; Heart attack (age of onset: 69) in her father.    ROS:  Please see the history of present illness.  All other ROS reviewed and negative.     Physical Exam/Data:   Blood pressure 128/62, pulse 61, height 5\' 6"  (1.676 m), weight 160 lb (72.6 kg).  General appearance: alert, cooperative, appears older than stated age, no distress, mildly obese and in a wheel chair Neck: JVD noted Lungs: few basilar rales Heart: regular rate and rhythm and 2/6 systolic murmur Extremities: both LE wrapped. both thighs have firm pitting edema Skin: pale, cool, dry Neurologic: Grossly normal   EKG:  The ECG that was done today shows NSR, 1st degree AVB, Q in V2  Relevant CV Studies: Echo from [facility] 08/01/17- INTERPRETATION  ---------------------------------------------------------------   MILD LV DYSFUNCTION (See above) WITH MILD LVH   ELEVATED LA PRESSURES WITH DIASTOLIC DYSFUNCTION   NORMAL RIGHT VENTRICULAR SYSTOLIC FUNCTION   VALVULAR REGURGITATION: MILD AR, MILD MR, TRIVIAL PR, MILD TR   VALVULAR STENOSIS: MILD AS   MILD TO MODERATE MR  Cath/PCI from [facility] 08/01/17- 1. 2-vessel CAD (Lcx and LAD) 2. PCI 80% lesion of mid and distal LAD-s/p DES  3. PCI 80% lesion of proximal and mid LCx-s/p DES   Assessment and Plan:   Acute combined systolic and diastolic congestive heart failure (HCC) Pt c/o SOB, increased weight, thigh edema  CAD S/P percutaneous coronary angioplasty Moderate 3V disease at cath 10/11/16- medical Rx Recurrent chest pain-- RCA PCI with DES 11/07/17 Pt had an LAD PCI with DES and a CFX PCI with DES at [facility] 08/01/17  Pre-operative clearance Pt sent to see [place] today for pre op (amputation) cardiac clearance prior to surgery to be done at [facility].  Hypertensive cardiomyopathy, with heart failure (HCC) Pt has grade 2 DD, moderate AS, moderate LVH, and EF of 40-45%  Charcot foot due to diabetes mellitus (HCC) Bilateral leg wounds- followed at [facility]. She needs Rt AKA  CKD (chronic kidney disease), stage III (HCC) Last SCr 1.7  Plan: Pt seen by Dr Tresa Endo and myself in the office. Admit for diuresis.    Severity of Illness: The appropriate patient status for this patient is OBSERVATION. Observation status is judged to be reasonable and necessary in order to provide the required intensity of service to ensure the patient's safety. The patient's presenting symptoms, physical exam findings, and initial radiographic and laboratory data in the context of their medical condition is felt to place them at decreased risk for further clinical deterioration. Furthermore, it is anticipated that the patient will be medically stable for discharge from the hospital within 2 midnights of  admission. The following factors support the patient status of observation.   " The patient's presenting symptoms include SOB. " The physical exam findings include edema. " The initial radiographic and laboratory data are unremarkable.     For questions or updates, please contact [facility] Please consult [email or web address] for contact info under Cardiology/STEMI.    Signed, [name], PA-C  08/28/2017 5:13 PM   Patient seen and examined. Agree with assessment and plan. Ms. Matusek is a 77 year old patient who is followed by Dr. Antoine Poche. .  She has known CAD as well as heart failure.  She presented with a non-ST segment elevation MI in May 2018.  She ultimately underwent stenting to sites in her right coronary artery.  She has had issues with chronic kidney disease and acute kidney injury.  She apparently was recently admitted to [facility] and due to progressive PVD is reportedly need for right AKA.  Apparently during that evaluation, she underwent intervention to her LAD and circumflex vessel.  She presented to the office today and was seen by [name] for preoperative assessment.  She has gained over 20 pounds of fluid since she was last seen in the office and has volume overload with tense lower extremity 4+ edema bilaterally an elevated neck veins. During her recent [facility] evaluation, she was found to have mild LV dysfunction with an EF of 40-45%, mild LVH, mild aortic stenosis with mild AR, mild-to-moderate MR,and diastolic dysfunction.  Agree with decision to  admit the patient for intravenous diuresis in the setting of volume overload. Will need to monitor renal function with her stage III chronic kidney disease.  At present, she is not having any recurrent anginal type symptomatology following her recent 2 vessel PCI to LAD and circumflex vessels.  Lennette Bihari, MD, [facility] 08/28/2017 7:19 PM

## 2017-08-29 DIAGNOSIS — E119 Type 2 diabetes mellitus without complications: Secondary | ICD-10-CM | POA: Diagnosis not present

## 2017-08-29 DIAGNOSIS — Z7902 Long term (current) use of antithrombotics/antiplatelets: Secondary | ICD-10-CM | POA: Diagnosis not present

## 2017-08-29 DIAGNOSIS — Z833 Family history of diabetes mellitus: Secondary | ICD-10-CM | POA: Diagnosis not present

## 2017-08-29 DIAGNOSIS — Z90710 Acquired absence of both cervix and uterus: Secondary | ICD-10-CM | POA: Diagnosis not present

## 2017-08-29 DIAGNOSIS — Z881 Allergy status to other antibiotic agents status: Secondary | ICD-10-CM | POA: Diagnosis not present

## 2017-08-29 DIAGNOSIS — Z7951 Long term (current) use of inhaled steroids: Secondary | ICD-10-CM | POA: Diagnosis not present

## 2017-08-29 DIAGNOSIS — I2510 Atherosclerotic heart disease of native coronary artery without angina pectoris: Secondary | ICD-10-CM | POA: Diagnosis present

## 2017-08-29 DIAGNOSIS — I43 Cardiomyopathy in diseases classified elsewhere: Secondary | ICD-10-CM | POA: Diagnosis present

## 2017-08-29 DIAGNOSIS — I5043 Acute on chronic combined systolic (congestive) and diastolic (congestive) heart failure: Secondary | ICD-10-CM | POA: Diagnosis present

## 2017-08-29 DIAGNOSIS — E11610 Type 2 diabetes mellitus with diabetic neuropathic arthropathy: Secondary | ICD-10-CM | POA: Diagnosis present

## 2017-08-29 DIAGNOSIS — I252 Old myocardial infarction: Secondary | ICD-10-CM | POA: Diagnosis not present

## 2017-08-29 DIAGNOSIS — Z888 Allergy status to other drugs, medicaments and biological substances status: Secondary | ICD-10-CM | POA: Diagnosis not present

## 2017-08-29 DIAGNOSIS — E1122 Type 2 diabetes mellitus with diabetic chronic kidney disease: Secondary | ICD-10-CM | POA: Diagnosis present

## 2017-08-29 DIAGNOSIS — R0602 Shortness of breath: Secondary | ICD-10-CM | POA: Diagnosis present

## 2017-08-29 DIAGNOSIS — N179 Acute kidney failure, unspecified: Secondary | ICD-10-CM | POA: Diagnosis not present

## 2017-08-29 DIAGNOSIS — Z955 Presence of coronary angioplasty implant and graft: Secondary | ICD-10-CM | POA: Diagnosis not present

## 2017-08-29 DIAGNOSIS — I130 Hypertensive heart and chronic kidney disease with heart failure and stage 1 through stage 4 chronic kidney disease, or unspecified chronic kidney disease: Secondary | ICD-10-CM | POA: Diagnosis present

## 2017-08-29 DIAGNOSIS — Z886 Allergy status to analgesic agent status: Secondary | ICD-10-CM | POA: Diagnosis not present

## 2017-08-29 DIAGNOSIS — E785 Hyperlipidemia, unspecified: Secondary | ICD-10-CM | POA: Diagnosis present

## 2017-08-29 DIAGNOSIS — E1151 Type 2 diabetes mellitus with diabetic peripheral angiopathy without gangrene: Secondary | ICD-10-CM | POA: Diagnosis present

## 2017-08-29 DIAGNOSIS — Z8249 Family history of ischemic heart disease and other diseases of the circulatory system: Secondary | ICD-10-CM | POA: Diagnosis not present

## 2017-08-29 DIAGNOSIS — N183 Chronic kidney disease, stage 3 (moderate): Secondary | ICD-10-CM | POA: Diagnosis not present

## 2017-08-29 DIAGNOSIS — E11649 Type 2 diabetes mellitus with hypoglycemia without coma: Secondary | ICD-10-CM | POA: Diagnosis not present

## 2017-08-29 LAB — GLUCOSE, CAPILLARY
Glucose-Capillary: 130 mg/dL — ABNORMAL HIGH (ref 65–99)
Glucose-Capillary: 139 mg/dL — ABNORMAL HIGH (ref 65–99)
Glucose-Capillary: 100 mg/dL — ABNORMAL HIGH (ref 65–99)
Glucose-Capillary: 94 mg/dL (ref 65–99)
Glucose-Capillary: 108 mg/dL — ABNORMAL HIGH (ref 65–99)

## 2017-08-29 MED ORDER — FUROSEMIDE 10 MG/ML IJ SOLN
40.0000 mg | Freq: Three times a day (TID) | INTRAMUSCULAR | Status: DC
  Administered 2017-08-29 – 2017-08-31 (×6): 40 mg via INTRAVENOUS
  Filled 2017-08-29 (×6): qty 4

## 2017-08-29 MED ORDER — CARVEDILOL 6.25 MG PO TABS
9.3750 mg | ORAL_TABLET | Freq: Two times a day (BID) | ORAL | Status: DC
  Administered 2017-08-29 – 2017-09-04 (×12): 9.375 mg via ORAL
  Filled 2017-08-29 (×12): qty 1

## 2017-08-29 NOTE — Progress Notes (Signed)
Per CCMD patient HR went down to 40 bpm during the night, non-sustaned and immediatly went back up.Patient asymptomatic, asleep. Will continue to monitor.  Olden Klauer, RN

## 2017-08-29 NOTE — Progress Notes (Addendum)
Patient has Lower Bilateral Wounds that are wrapped with gauze and kerlex. Per patient, wound nurse changes dressings every day,. She stated that they have been already dressed and she refused dressings  te be removed or changed today. Paged MD and requested WOC consult.  Will continue to monitor.  Byford Schools, RN

## 2017-08-29 NOTE — Care Management Note (Addendum)
Case Management Note  Patient Details  Name: VANESIA WORKING MRN: [id number] Date of Birth: 23-Jun-1940  Subjective/Objective:   History of mild to moderate aortic stenosis, EF 40-45%, HCVD-grade 2 DD, IDDMwithCharcotchronic Rt foot wound, NSTEMI; Admitted for Acute on chronic CHF due to valvular disease.             Action/Plan: PCP noted.  Prior to admission patient lived at home with spouse. Will be returning to the same living situation after discharge.  At discharge, patient has transportation home.  Patient has the ability to pay for  medications and food. NCM will continue to follow for discharge planning needs.  Expected Discharge Date:   To Be determined               Expected Discharge Plan:  Home/Self Care  In-House Referral:  [facility]  Discharge planning Services  CM Consult  Status of Service:  In process, will continue to follow  Yancey Flemings, RN  Nurse case manager 08/29/2017, 11:53 AM

## 2017-08-29 NOTE — Progress Notes (Signed)
Chaplain came to pt.'s room by way of request for prayer. Present in Emily Miranda's room were her husband and adult son. All were welcoming. Chaplain offered Words of Comfort, Social worker. Pt. Expressed gratitude and that she felt encouraged. She also shared her favorite Biblical passage. Proverbs 3:5-6.

## 2017-08-29 NOTE — Consult Note (Signed)
   [facility] CM Inpatient Consult   08/29/2017  Emily Miranda 05/10/1941 [id number]  Patient was assessed for [place] [facility] for community services. Patient was previously active with [place] [facility].  Met with patient, husband, and son at bedside regarding being restarted with [facility] services. Patient appreciative for the visit but states between her trips at [facility] for her wound and upcoming surgery, cardiology follow up and her home health care she feels she is pretty covered and had no additional community follow up needs. She states that she was at her cardiologist yesterday for surgical clearance and found the extra fluid.  She did admit to abdominal pressure and feeling of being full. She currently declines [place] [facility] for follow up.  But she did take the additional 24 hour nurse advise line and contact information.  Encouraged to call for needs.   For additional questions or referrals please contact:   Natividad Brood, RN BSN [facility] Liaison  [phone] business mobile phone Toll free office [phone]

## 2017-08-29 NOTE — Consult Note (Signed)
WOC Nurse wound consult note Reason for Consult: Bilateral lower extremity wounds Wound type: Appearance of bilateral lower legs is consistent with venous insufficiency. No open wounds observed on left lower leg, only erythema and minimal scattered crusted areas; no weeping. The right foot presents with charcot deformity.  In the inner ankle area the patient has a shallow open wound that measures 1.8 cm x 1.8 cm without appreciable depth.  The plantar aspect of the foot has a shallow open area that measures 1.0 cm x 1.0 cm without appreciable depth.  There is no odor or drainage, or overt signs of infection to these areas.  There is erythema to the lower leg.  The patient states she has been receiving wound care at the [facility].  She states she is here to "get the fluid off" so she can go to [facility] to the her right foot amputated.  We discussed at length placing either an ACE wrap to her lower legs, or compression wraps.  At this time she declines either of these options.  She tells me that her legs "swell" and "hurt really bad" when either of these modalities are used, and that the clinicians at the [facility] clinic told her using dry kerlex is fine for now. Orders were entered for care of each leg and to reconsult our WOC team if she decides she wishes to pursue ACE wraps or compression wraps.   Thank you for the consult.  Discussed plan of care with the patient and bedside nurse.  WOC nurse will not follow at this time.  Please re-consult the WOC team if needed.  Helmut Muster, RN, MSN, CWOCN, CNS-BC, pager [phone]

## 2017-08-29 NOTE — Progress Notes (Signed)
Progress Note  Patient Name: Emily Miranda Date of Encounter: 08/29/2017  Primary Cardiologist:   No primary care provider on file.   Subjective   She thinks that she is breathing better.  She has pain in her legs (right greater than left)  Inpatient Medications    Scheduled Meds: . aspirin EC  81 mg Oral Daily  . atorvastatin  40 mg Oral q1800  . carvedilol  6.25 mg Oral BID WC  . clopidogrel  75 mg Oral Daily  . cyclobenzaprine  10 mg Oral TID  . enoxaparin (LOVENOX) injection  40 mg Subcutaneous Q24H  . furosemide  40 mg Intravenous BID  . glimepiride  2 mg Oral Q breakfast  . hydrALAZINE  25 mg Oral Q8H  . insulin aspart  0-15 Units Subcutaneous TID WC  . insulin aspart  0-5 Units Subcutaneous QHS  . insulin glargine  30 Units Subcutaneous QHS  . isosorbide mononitrate  30 mg Oral Daily  . linagliptin  5 mg Oral Daily  . lisinopril  30 mg Oral Daily  . loratadine  10 mg Oral Daily  . pantoprazole  40 mg Oral Q0600  . polyethylene glycol  17 g Oral Daily  . potassium chloride  20 mEq Oral Daily  . silver sulfADIAZINE   Topical Daily   Continuous Infusions:  PRN Meds: acetaminophen, ALPRAZolam, HYDROcodone-acetaminophen, nitroGLYCERIN, ondansetron (ZOFRAN) IV, sodium chloride flush   Vital Signs    Vitals:   08/28/17 2200 08/28/17 2227 08/28/17 2358 08/29/17 0629  BP:  (!) 171/67 (!) 156/74 (!) 144/67  Pulse:  79 75 65  Resp:  18 18 18   Temp:   98.1 F (36.7 C) 98.4 F (36.9 C)  TempSrc:   Oral Oral  SpO2:   95% 95%  Weight: 167 lb 8.8 oz (76 kg)   166 lb 3.6 oz (75.4 kg)  Height: 5\' 6"  (1.676 m)       Intake/Output Summary (Last 24 hours) at 08/29/2017 0755 Last data filed at 08/29/2017 0659 Gross per 24 hour  Intake 120 ml  Output 650 ml  Net -530 ml   Filed Weights   08/28/17 2200 08/29/17 0629  Weight: 167 lb 8.8 oz (76 kg) 166 lb 3.6 oz (75.4 kg)    Telemetry    NSR - Personally Reviewed  ECG    NA - Personally  Reviewed  Physical Exam   GEN: No acute distress.   Neck: No  JVD Cardiac: RRR, 2/6 apical systolic murmur, rubs, or gallops.  Respiratory:   Few basilar crackles GI: Soft, nontender, non-distended  MS: Moderate bilateral leg edema; Charcot joint Neuro:  Nonfocal  Psych: Normal affect   Labs    Chemistry Recent Labs  Lab 08/28/17 1916  NA 141  K 4.4  CL 116*  CO2 16*  GLUCOSE 252*  BUN 29*  CREATININE 1.71*  CALCIUM 8.4*  PROT 5.6*  ALBUMIN 2.5*  AST 14*  ALT 13*  ALKPHOS 91  BILITOT 0.5  GFRNONAA 28*  GFRAA 33*  ANIONGAP 9     Hematology Recent Labs  Lab 08/28/17 1916  WBC 6.1  RBC 2.84*  HGB 7.7*  HCT 25.0*  MCV 88.0  MCH 27.1  MCHC 30.8  RDW 15.3  PLT 231    Cardiac EnzymesNo results for input(s): TROPONINI in the last 168 hours. No results for input(s): TROPIPOC in the last 168 hours.   BNP Recent Labs  Lab 08/28/17 1916  BNP 2,542.7*  Lab Results  Component Value Date   HGBA1C 11.8 (H) 05/29/2017     DDimer No results for input(s): DDIMER in the last 168 hours.   Radiology    No results found.  Cardiac Studies   NA  Patient Profile     77 y.o. female  with a history of  Chronic combined systolic and diastolic CHF who was admitted from the office with CHF.  Assessment & Plan    CAD:  No recurrent angina.  Continue medical management.   Mildly elevated troponin in this situation is not diagnostic.   PVD:  Needs right AKA.  Plan is to have this at [facility] once she is medically cleared.    CKD III:  Current creat is at about her baseline.  Follow closely.    ACUTE ON CHRONIC SYSTOLIC AND DIASTOLIC HF:    - 530 cc overnight.  I am going to increase to TID Lasix tonight and possibly increase again tomorrow pending out put and creat.    HTN:  Is slightly elevated.  I will increase the Coreg slightly.    DM:  Her last A1C demonstrated poorly controlled DM.  This is managed by Noni Saupe, MD as an out patient.  Continue  current meds.  Educate and follow with SSI while in the hospital.     For questions or updates, please contact [facility] Please consult [email or web address] for contact info under Cardiology/STEMI.   Signed, Rollene Rotunda, MD  08/29/2017, 7:55 AM

## 2017-08-30 LAB — BASIC METABOLIC PANEL
Anion gap: 7 (ref 5–15)
BUN: 32 mg/dL — ABNORMAL HIGH (ref 6–20)
CO2: 19 mmol/L — ABNORMAL LOW (ref 22–32)
Calcium: 8.5 mg/dL — ABNORMAL LOW (ref 8.9–10.3)
Chloride: 114 mmol/L — ABNORMAL HIGH (ref 101–111)
Creatinine, Ser: 1.69 mg/dL — ABNORMAL HIGH (ref 0.44–1.00)
GFR calc Af Amer: 33 mL/min — ABNORMAL LOW (ref >60)
GFR calc non Af Amer: 28 mL/min — ABNORMAL LOW (ref >60)
Glucose, Bld: 80 mg/dL (ref 65–99)
Potassium: 4.3 mmol/L (ref 3.5–5.1)
Sodium: 140 mmol/L (ref 135–145)

## 2017-08-30 LAB — GLUCOSE, CAPILLARY
Glucose-Capillary: 88 mg/dL (ref 65–99)
Glucose-Capillary: 51 mg/dL — ABNORMAL LOW (ref 65–99)
Glucose-Capillary: 120 mg/dL — ABNORMAL HIGH (ref 65–99)
Glucose-Capillary: 104 mg/dL — ABNORMAL HIGH (ref 65–99)
Glucose-Capillary: 112 mg/dL — ABNORMAL HIGH (ref 65–99)
Glucose-Capillary: 92 mg/dL (ref 65–99)

## 2017-08-30 MED ORDER — ASPIRIN 81 MG PO CHEW
81.0000 mg | CHEWABLE_TABLET | Freq: Every day | ORAL | Status: DC
  Administered 2017-08-30 – 2017-09-04 (×6): 81 mg via ORAL
  Filled 2017-08-30 (×6): qty 1

## 2017-08-30 MED ORDER — POLYETHYLENE GLYCOL 3350 17 G PO PACK
17.0000 g | PACK | Freq: Every day | ORAL | Status: DC
  Administered 2017-08-30 – 2017-09-02 (×2): 17 g via ORAL
  Filled 2017-08-30 (×6): qty 1

## 2017-08-30 MED ORDER — INSULIN GLARGINE 100 UNIT/ML ~~LOC~~ SOLN
25.0000 [IU] | Freq: Every day | SUBCUTANEOUS | Status: DC
  Administered 2017-08-30 – 2017-08-31 (×2): 25 [IU] via SUBCUTANEOUS
  Filled 2017-08-30 (×2): qty 0.25

## 2017-08-30 MED ORDER — ASPIRIN 81 MG PO CHEW
CHEWABLE_TABLET | ORAL | Status: AC
  Filled 2017-08-30: qty 1

## 2017-08-30 NOTE — Plan of Care (Signed)
  Problem: Activity: Goal: Risk for activity intolerance will decrease Outcome: Completed/Met   Problem: Nutrition: Goal: Adequate nutrition will be maintained Outcome: Completed/Met   Problem: Elimination: Goal: Will not experience complications related to bowel motility Outcome: Completed/Met

## 2017-08-30 NOTE — Progress Notes (Signed)
Progress Note  Patient Name: ZAKEYA BOUCH Date of Encounter: 08/30/2017  Primary Cardiologist: Rollene Rotunda, MD   Subjective   Ongoing SOB  Inpatient Medications    Scheduled Meds: . aspirin      . aspirin  81 mg Oral Daily  . atorvastatin  40 mg Oral q1800  . carvedilol  9.375 mg Oral BID WC  . clopidogrel  75 mg Oral Daily  . cyclobenzaprine  10 mg Oral TID  . enoxaparin (LOVENOX) injection  40 mg Subcutaneous Q24H  . furosemide  40 mg Intravenous TID  . glimepiride  2 mg Oral Q breakfast  . hydrALAZINE  25 mg Oral Q8H  . insulin aspart  0-15 Units Subcutaneous TID WC  . insulin aspart  0-5 Units Subcutaneous QHS  . insulin glargine  30 Units Subcutaneous QHS  . isosorbide mononitrate  30 mg Oral Daily  . linagliptin  5 mg Oral Daily  . lisinopril  30 mg Oral Daily  . loratadine  10 mg Oral Daily  . pantoprazole  40 mg Oral Q0600  . polyethylene glycol  17 g Oral Daily  . potassium chloride  20 mEq Oral Daily  . silver sulfADIAZINE   Topical Daily   Continuous Infusions:  PRN Meds: acetaminophen, ALPRAZolam, HYDROcodone-acetaminophen, nitroGLYCERIN, ondansetron (ZOFRAN) IV, sodium chloride flush   Vital Signs    Vitals:   08/29/17 2025 08/30/17 0648 08/30/17 0706 08/30/17 1141  BP: (!) 158/66 (!) 156/91  133/60  Pulse: 65 70  61  Resp: 18 20    Temp: 98.3 F (36.8 C) 98 F (36.7 C)  97.9 F (36.6 C)  TempSrc: Oral Oral  Oral  SpO2: 95% 96%  97%  Weight:   165 lb 4.8 oz (75 kg)   Height:        Intake/Output Summary (Last 24 hours) at 08/30/2017 1259 Last data filed at 08/30/2017 0900 Gross per 24 hour  Intake 598 ml  Output 2800 ml  Net -2202 ml   Filed Weights   08/28/17 2200 08/29/17 0629 08/30/17 0706  Weight: 167 lb 8.8 oz (76 kg) 166 lb 3.6 oz (75.4 kg) 165 lb 4.8 oz (75 kg)    Telemetry    Sinus brady - Personally Reviewed  ECG     Physical Exam   GEN: No acute distress.   Neck: +JVD Cardiac: RRR, 3/6 systolic murmur  rusb, rubs, or gallops.  Respiratory: Clear to auscultation bilaterally. GI: Soft, nontender, non-distended  MS: No edema; No deformity. Neuro:  Nonfocal  Psych: Normal affect   Labs    Chemistry Recent Labs  Lab 08/28/17 1916 08/30/17 0528  NA 141 140  K 4.4 4.3  CL 116* 114*  CO2 16* 19*  GLUCOSE 252* 80  BUN 29* 32*  CREATININE 1.71* 1.69*  CALCIUM 8.4* 8.5*  PROT 5.6*  --   ALBUMIN 2.5*  --   AST 14*  --   ALT 13*  --   ALKPHOS 91  --   BILITOT 0.5  --   GFRNONAA 28* 28*  GFRAA 33* 33*  ANIONGAP 9 7     Hematology Recent Labs  Lab 08/28/17 1916  WBC 6.1  RBC 2.84*  HGB 7.7*  HCT 25.0*  MCV 88.0  MCH 27.1  MCHC 30.8  RDW 15.3  PLT 231    Cardiac EnzymesNo results for input(s): TROPONINI in the last 168 hours. No results for input(s): TROPIPOC in the last 168 hours.   BNP Recent Labs  Lab 08/28/17 1916  BNP 2,542.7*     DDimer No results for input(s): DDIMER in the last 168 hours.   Radiology    No results found.  Cardiac Studies     Patient Profile     77 y.o. female with a history of Chronic combined systolic and diastolic CHF who was admitted from the office with CHF.    Assessment & Plan    1. Acute on chronic sysotlic/diastolic HF - 07/2016 echo LVEF 40-45%, grade II diasotlic dysfunction. Mild AS, mild MR - negative 1.8 liters yesterday, negative 2.3 liters since admission. Lasix IV 40mg  increased from bid to tid yesterday with improved diuresis. Downtrend in Cr. Continue diuresis today.  - medical threrapy with coreg, hydral, imdur, lisinopril - remains volume overloaded by exam, conintue diuretics. Reasonable net diuresis yesterday, continue current dosing  2. CAD - no acute issues  3. PAD - needs right AKA according to notes, plans to be done at [facility]  4. DM2 - some hypoglycemia this AM. Decrease evening lantus to 25 units   For questions or updates, please contact [facility] Please consult [email or web address] for  contact info under Cardiology/STEMI.      [name], MD  08/30/2017, 12:59 PM

## 2017-08-30 NOTE — Progress Notes (Signed)
Pt blood sugar 51 Pt given orange juice and set up for breakfast  Education provided to pt, pt alert and oriented x4 Will recheck in 30 minutes

## 2017-08-31 LAB — GLUCOSE, CAPILLARY
Glucose-Capillary: 77 mg/dL (ref 65–99)
Glucose-Capillary: 122 mg/dL — ABNORMAL HIGH (ref 65–99)
Glucose-Capillary: 90 mg/dL (ref 65–99)
Glucose-Capillary: 125 mg/dL — ABNORMAL HIGH (ref 65–99)
Glucose-Capillary: 204 mg/dL — ABNORMAL HIGH (ref 65–99)
Glucose-Capillary: 128 mg/dL — ABNORMAL HIGH (ref 65–99)

## 2017-08-31 LAB — BASIC METABOLIC PANEL
Anion gap: 7 (ref 5–15)
BUN: 37 mg/dL — ABNORMAL HIGH (ref 6–20)
CO2: 21 mmol/L — ABNORMAL LOW (ref 22–32)
Calcium: 8.7 mg/dL — ABNORMAL LOW (ref 8.9–10.3)
Chloride: 111 mmol/L (ref 101–111)
Creatinine, Ser: 1.81 mg/dL — ABNORMAL HIGH (ref 0.44–1.00)
GFR calc Af Amer: 30 mL/min — ABNORMAL LOW (ref >60)
GFR calc non Af Amer: 26 mL/min — ABNORMAL LOW (ref >60)
Glucose, Bld: 96 mg/dL (ref 65–99)
Potassium: 4.7 mmol/L (ref 3.5–5.1)
Sodium: 139 mmol/L (ref 135–145)

## 2017-08-31 MED ORDER — ENOXAPARIN SODIUM 30 MG/0.3ML ~~LOC~~ SOLN
30.0000 mg | SUBCUTANEOUS | Status: DC
  Administered 2017-08-31 – 2017-09-03 (×4): 30 mg via SUBCUTANEOUS
  Filled 2017-08-31 (×4): qty 0.3

## 2017-08-31 NOTE — Progress Notes (Signed)
Progress Note  Patient Name: Emily Miranda Date of Encounter: 08/31/2017  Primary Cardiologist: Emily Rotunda, MD   Subjective   SOB improving but not resolved  Inpatient Medications    Scheduled Meds: . aspirin  81 mg Oral Daily  . atorvastatin  40 mg Oral q1800  . carvedilol  9.375 mg Oral BID WC  . clopidogrel  75 mg Oral Daily  . cyclobenzaprine  10 mg Oral TID  . enoxaparin (LOVENOX) injection  40 mg Subcutaneous Q24H  . furosemide  40 mg Intravenous TID  . glimepiride  2 mg Oral Q breakfast  . hydrALAZINE  25 mg Oral Q8H  . insulin aspart  0-15 Units Subcutaneous TID WC  . insulin aspart  0-5 Units Subcutaneous QHS  . insulin glargine  25 Units Subcutaneous QHS  . isosorbide mononitrate  30 mg Oral Daily  . linagliptin  5 mg Oral Daily  . lisinopril  30 mg Oral Daily  . loratadine  10 mg Oral Daily  . pantoprazole  40 mg Oral Q0600  . polyethylene glycol  17 g Oral Daily  . potassium chloride  20 mEq Oral Daily  . silver sulfADIAZINE   Topical Daily   Continuous Infusions:  PRN Meds: acetaminophen, ALPRAZolam, HYDROcodone-acetaminophen, nitroGLYCERIN, ondansetron (ZOFRAN) IV, sodium chloride flush   Vital Signs    Vitals:   08/30/17 1822 08/30/17 1944 08/31/17 0635 08/31/17 0638  BP:  (!) 157/75 (!) 140/57   Pulse:  67 70   Resp: 18 18    Temp:  98 F (36.7 C) 98.2 F (36.8 C)   TempSrc:  Oral Oral   SpO2: 100% 100% 98%   Weight:    159 lb 4.8 oz (72.3 kg)  Height:        Intake/Output Summary (Last 24 hours) at 08/31/2017 0957 Last data filed at 08/31/2017 0921 Gross per 24 hour  Intake 783 ml  Output 3700 ml  Net -2917 ml   Filed Weights   08/29/17 0629 08/30/17 0706 08/31/17 1914  Weight: 166 lb 3.6 oz (75.4 kg) 165 lb 4.8 oz (75 kg) 159 lb 4.8 oz (72.3 kg)    Telemetry    SR - Personally Reviewed  ECG  na  Physical Exam   GEN: No acute distress.   Neck: +JVD Cardiac: RRR, 2/6 systolic murmur rusb, rubs, or gallops.    Respiratory: Clear to auscultation bilaterally. GI: Soft, nontender, non-distended  MS: No edema; No deformity. Neuro:  Nonfocal  Psych: Normal affect   Labs    Chemistry Recent Labs  Lab 08/28/17 1916 08/30/17 0528 08/31/17 0839  NA 141 140 139  K 4.4 4.3 4.7  CL 116* 114* 111  CO2 16* 19* 21*  GLUCOSE 252* 80 96  BUN 29* 32* 37*  CREATININE 1.71* 1.69* 1.81*  CALCIUM 8.4* 8.5* 8.7*  PROT 5.6*  --   --   ALBUMIN 2.5*  --   --   AST 14*  --   --   ALT 13*  --   --   ALKPHOS 91  --   --   BILITOT 0.5  --   --   GFRNONAA 28* 28* 26*  GFRAA 33* 33* 30*  ANIONGAP 9 7 7      Hematology Recent Labs  Lab 08/28/17 1916  WBC 6.1  RBC 2.84*  HGB 7.7*  HCT 25.0*  MCV 88.0  MCH 27.1  MCHC 30.8  RDW 15.3  PLT 231    Cardiac EnzymesNo results  for input(s): TROPONINI in the last 168 hours. No results for input(s): TROPIPOC in the last 168 hours.   BNP Recent Labs  Lab 08/28/17 1916  BNP 2,542.7*     DDimer No results for input(s): DDIMER in the last 168 hours.   Radiology    No results found.  Cardiac Studies    Patient Profile     77 y.o.femalewith a history of Chronic combined systolic and diastolic CHF who was admitted from the office with CHF.   Assessment & Plan    1. Acute on chronic sysotlic/diastolic HF - 07/2016 echo LVEF 40-45%, grade II diasotlic dysfunction. Mild AS, mild MR - negative 3.4 liters yesterday, negative 5.6 liters since admission. Lasix IV 40mg  tid. Uptrend in Cr, significant diuresis yesterday. Will dose IV lasix 40mg  x 1 today.  - medical threrapy with coreg, hydral, imdur, lisinopril  - remains volume overloaded and symptomatic. With bump in Cr dose lasix just once today, hopefully with mobliziation of peripheral fluid will be able to further diurese tomorrow.    2. CAD - no acute issues  3. PAD - needs right AKA according to notes, plans to be done at [facility]  4. DM2 - some hypoglycemia yesterday AM, night time  lantus decreased. Levels ok this morning.     For questions or updates, please contact [facility] Please consult [email or web address] for contact info under Cardiology/STEMI.      Emily Coddington, MD  08/31/2017, 9:57 AM

## 2017-08-31 NOTE — Plan of Care (Signed)
  Problem: Safety: Goal: Ability to remain free from injury will improve Outcome: Completed/Met

## 2017-09-01 DIAGNOSIS — E119 Type 2 diabetes mellitus without complications: Secondary | ICD-10-CM

## 2017-09-01 LAB — URINALYSIS, ROUTINE W REFLEX MICROSCOPIC
Bacteria, UA: NONE SEEN
Bilirubin Urine: NEGATIVE
Glucose, UA: NEGATIVE mg/dL
Hgb urine dipstick: NEGATIVE
Ketones, ur: NEGATIVE mg/dL
Leukocytes, UA: NEGATIVE
Nitrite: NEGATIVE
Protein, ur: 30 mg/dL — AB
Specific Gravity, Urine: 1.005 (ref 1.005–1.030)
pH: 5 (ref 5.0–8.0)

## 2017-09-01 LAB — GLUCOSE, CAPILLARY
Glucose-Capillary: 39 mg/dL — CL (ref 65–99)
Glucose-Capillary: 69 mg/dL (ref 65–99)
Glucose-Capillary: 82 mg/dL (ref 65–99)
Glucose-Capillary: 121 mg/dL — ABNORMAL HIGH (ref 65–99)
Glucose-Capillary: 141 mg/dL — ABNORMAL HIGH (ref 65–99)

## 2017-09-01 LAB — BASIC METABOLIC PANEL
Anion gap: 5 (ref 5–15)
BUN: 35 mg/dL — ABNORMAL HIGH (ref 6–20)
CO2: 22 mmol/L (ref 22–32)
Calcium: 8.2 mg/dL — ABNORMAL LOW (ref 8.9–10.3)
Chloride: 110 mmol/L (ref 101–111)
Creatinine, Ser: 1.67 mg/dL — ABNORMAL HIGH (ref 0.44–1.00)
GFR calc Af Amer: 33 mL/min — ABNORMAL LOW (ref >60)
GFR calc non Af Amer: 29 mL/min — ABNORMAL LOW (ref >60)
Glucose, Bld: 49 mg/dL — ABNORMAL LOW (ref 65–99)
Potassium: 4.8 mmol/L (ref 3.5–5.1)
Sodium: 137 mmol/L (ref 135–145)

## 2017-09-01 MED ORDER — FUROSEMIDE 10 MG/ML IJ SOLN
40.0000 mg | Freq: Two times a day (BID) | INTRAMUSCULAR | Status: DC
  Administered 2017-09-01 – 2017-09-03 (×5): 40 mg via INTRAVENOUS
  Filled 2017-09-01 (×5): qty 4

## 2017-09-01 MED ORDER — INSULIN GLARGINE 100 UNIT/ML ~~LOC~~ SOLN
18.0000 [IU] | Freq: Every day | SUBCUTANEOUS | Status: DC
  Administered 2017-09-01: 18 [IU] via SUBCUTANEOUS
  Filled 2017-09-01: qty 0.18

## 2017-09-01 NOTE — Progress Notes (Addendum)
Progress Note  Patient Name: Emily Miranda Date of Encounter: 09/01/2017  Primary Cardiologist: Rollene Rotunda, MD   Subjective   Still feels tight in her abdomen. She is hypoglycemic this morning making her feel poorly (drinking juice)  Inpatient Medications    Scheduled Meds: . aspirin  81 mg Oral Daily  . atorvastatin  40 mg Oral q1800  . carvedilol  9.375 mg Oral BID WC  . clopidogrel  75 mg Oral Daily  . cyclobenzaprine  10 mg Oral TID  . enoxaparin (LOVENOX) injection  30 mg Subcutaneous Q24H  . glimepiride  2 mg Oral Q breakfast  . hydrALAZINE  25 mg Oral Q8H  . insulin aspart  0-15 Units Subcutaneous TID WC  . insulin aspart  0-5 Units Subcutaneous QHS  . insulin glargine  25 Units Subcutaneous QHS  . isosorbide mononitrate  30 mg Oral Daily  . linagliptin  5 mg Oral Daily  . lisinopril  30 mg Oral Daily  . loratadine  10 mg Oral Daily  . pantoprazole  40 mg Oral Q0600  . polyethylene glycol  17 g Oral Daily  . potassium chloride  20 mEq Oral Daily  . silver sulfADIAZINE   Topical Daily   Continuous Infusions:  PRN Meds: acetaminophen, ALPRAZolam, HYDROcodone-acetaminophen, nitroGLYCERIN, ondansetron (ZOFRAN) IV, sodium chloride flush   Vital Signs    Vitals:   08/31/17 1146 08/31/17 2035 08/31/17 2324 09/01/17 0608  BP: 132/62 (!) 173/69 (!) 164/71 (!) 156/63  Pulse: (!) 59 67  66  Resp: 20     Temp: 97.7 F (36.5 C) 97.7 F (36.5 C)  98.2 F (36.8 C)  TempSrc: Oral Oral  Oral  SpO2: 98% 100%  98%  Weight:      Height:        Intake/Output Summary (Last 24 hours) at 09/01/2017 0911 Last data filed at 08/31/2017 2319 Gross per 24 hour  Intake 973 ml  Output 1050 ml  Net -77 ml   Filed Weights   08/29/17 0629 08/30/17 0706 08/31/17 4098  Weight: 166 lb 3.6 oz (75.4 kg) 165 lb 4.8 oz (75 kg) 159 lb 4.8 oz (72.3 kg)    Telemetry    Appears to be 1st degree heart block, occasional junctional beat - Personally Reviewed  ECG    No new  tracings - Personally Reviewed  Physical Exam   GEN: No acute distress.   Neck: + JVD Cardiac: RRR, 4/6 systolic murmur Respiratory: respirations unlabored. Scattered wheezing GI: Soft, nontender, non-distended  MS: improving edema; No deformity. Neuro:  Nonfocal  Psych: Normal affect   Labs    Chemistry Recent Labs  Lab 08/28/17 1916 08/30/17 0528 08/31/17 0839 09/01/17 0712  NA 141 140 139 137  K 4.4 4.3 4.7 4.8  CL 116* 114* 111 110  CO2 16* 19* 21* 22  GLUCOSE 252* 80 96 49*  BUN 29* 32* 37* 35*  CREATININE 1.71* 1.69* 1.81* 1.67*  CALCIUM 8.4* 8.5* 8.7* 8.2*  PROT 5.6*  --   --   --   ALBUMIN 2.5*  --   --   --   AST 14*  --   --   --   ALT 13*  --   --   --   ALKPHOS 91  --   --   --   BILITOT 0.5  --   --   --   GFRNONAA 28* 28* 26* 29*  GFRAA 33* 33* 30* 33*  ANIONGAP 9 7  7 5     Hematology Recent Labs  Lab 08/28/17 1916  WBC 6.1  RBC 2.84*  HGB 7.7*  HCT 25.0*  MCV 88.0  MCH 27.1  MCHC 30.8  RDW 15.3  PLT 231    Cardiac EnzymesNo results for input(s): TROPONINI in the last 168 hours. No results for input(s): TROPIPOC in the last 168 hours.   BNP Recent Labs  Lab 08/28/17 1916  BNP 2,542.7*     DDimer No results for input(s): DDIMER in the last 168 hours.   Radiology    No results found.  Cardiac Studies   PCI 11/05/16:  Mid RCA lesion, 50 %stenosed.  A STENT RESOLUTE ONYX 2.0X12 drug eluting stent was successfully placed.  Ost RPDA to RPDA lesion, 95 %stenosed.  Post intervention, there is a 0% residual stenosis.  A STENT RESOLUTE ONYX 2.25X15 drug eluting stent was successfully placed.  Prox RCA to Mid RCA lesion, 70 %stenosed.  Post intervention, there is a 0% residual stenosis.  Dist RCA lesion, 40 %stenosed.   Successful percutaneous coronary intervention to a 95% proximal PDA stenosis and a small caliber vessel, treated with a [place] 2.010 mm Cutting Balloon and DES stenting with a 2.012 mm Resolute Onyx  stent postdilated to 2.1 mm with the 95% stenosis being reduced to 0%.  Successful PCI to the mid RCA with Cutting Balloon atherotomy and DES stenting with a 2.2515 mm Resolute Onyx DES stent postdilated with a 2.5 noncompliant balloon and the tandem stenoses being reduced to 0%.   Echo 07/30/16: Study Conclusions - Left ventricle: The cavity size was normal. Wall thickness was   normal. Systolic function was mildly to moderately reduced. The   estimated ejection fraction was in the range of 40% to 45%.   Akinesis of the midanteroseptal myocardium. Features are   consistent with a pseudonormal left ventricular filling pattern,   with concomitant abnormal relaxation and increased filling   pressure ( grade 2 diastolic dysfunction). - Aortic valve: Mildly to moderately calcified annulus. Mildly   thickened, mildly calcified leaflets. There was mild stenosis.   There was trivial regurgitation. Valve area (VTI): 1.31 cm^2.   Valve area (Vmax): 1.28 cm^2. Valve area (Vmean): 1.26 cm^2. - Mitral valve: There was mild regurgitation. - Left atrium: The atrium was moderately dilated. - Pericardium, extracardiac: A trivial pericardial effusion was   identified. There was a left pleural effusion.  Patient Profile     77 y.o. female with a history of Chronic combined systolic and diastolic CHF who was admitted from the office with CHF.  Assessment & Plan    1. Acute on chronic systolic and diastolic heart failure - echo last year with LVEF of 40-45% and grade 2 diastolic dysfunction - previously diuresing on 40 mg IV lasix TID, decreased to daily yesterday for bump in creatinine - sCr 1.67 (1.81) today - she is overall net negative nearly 6 L with 1.4 L urine output yesterday - on exam, patient remains hypervolemic, + JVD - medical therapy includes coreg, hydralazine, imdur, and lisinopril - will add back 40 mg IV lasix BID - daily BMP   2. CAD s/p recent PCI with DES to proximal PDA  and DES to mid RCA - no chest pain   3. PAD - needs right AKA to be done at [facility]   4. DM2 - is hypoglycemic this morning, hold oral diabetic medications this morning  - drinking juice, low BG making her feel poorly   For questions  or updates, please contact [facility] Please consult [email or web address] for contact info under Cardiology/STEMI.      Signed, Roe Rutherford Duke, PA  09/01/2017, 9:11 AM    Pt seen and examined   I agree with findings of A Duke above  Pt still with fullness in abdomen ON exaM:  JVP increased  Lungs are rel clear  Cardiac exam with RRR   Gr III/VI systolic murmur LSB   Abd sl distended   Ext with 1+ edema  I would continue to diuresis with IV lasix bid for acute on chronic systolic / diastolic CHF      Dietrich Pates

## 2017-09-01 NOTE — Progress Notes (Signed)
Inpatient Diabetes Program Recommendations  AACE/ADA: New Consensus Statement on Inpatient Glycemic Control (2015)  Target Ranges:  Prepandial:   less than 140 mg/dL      Peak postprandial:   less than 180 mg/dL (1-2 hours)      Critically ill patients:  140 - 180 mg/dL   Lab Results  Component Value Date   GLUCAP 69 09/01/2017   HGBA1C 11.8 (H) 05/29/2017    Review of Glycemic Control Results for Emily Miranda, Emily Miranda (MRN [id number]) as of 09/01/2017 12:53  Ref. Range 08/31/2017 16:30 08/31/2017 20:36 08/31/2017 23:24 09/01/2017 08:26 09/01/2017 08:54  Glucose-Capillary Latest Ref Range: 65 - 99 mg/dL 798 (H) 921 (H) 194 (H) 39 (LL) 69   Diabetes history: Type 2 DM Outpatient Diabetes medications:  Amaryl 2 mg bid, Tradjenta 5 mg daily Current orders for Inpatient glycemic control:  Novolog moderate tid with meals and HS Amaryl 2 mg bid, Tradjenta 5 mg daily, Lantus 25 units q HS Inpatient Diabetes Program Recommendations:   Note low fasting CBG. Please consider reducing Lantus to 18 units q HS.  Also may consider holding Amaryl while in the hospital. Text page sent.  Thanks,  Beryl Meager, RN, BC-ADM Inpatient Diabetes Coordinator Pager [phone] (8a-5p)

## 2017-09-02 DIAGNOSIS — N179 Acute kidney failure, unspecified: Secondary | ICD-10-CM

## 2017-09-02 LAB — BASIC METABOLIC PANEL
Anion gap: 7 (ref 5–15)
BUN: 42 mg/dL — ABNORMAL HIGH (ref 6–20)
CO2: 22 mmol/L (ref 22–32)
Calcium: 8.5 mg/dL — ABNORMAL LOW (ref 8.9–10.3)
Chloride: 110 mmol/L (ref 101–111)
Creatinine, Ser: 1.81 mg/dL — ABNORMAL HIGH (ref 0.44–1.00)
GFR calc Af Amer: 30 mL/min — ABNORMAL LOW (ref >60)
GFR calc non Af Amer: 26 mL/min — ABNORMAL LOW (ref >60)
Glucose, Bld: 115 mg/dL — ABNORMAL HIGH (ref 65–99)
Potassium: 5.1 mmol/L (ref 3.5–5.1)
Sodium: 139 mmol/L (ref 135–145)

## 2017-09-02 LAB — GLUCOSE, CAPILLARY
Glucose-Capillary: 126 mg/dL — ABNORMAL HIGH (ref 65–99)
Glucose-Capillary: 130 mg/dL — ABNORMAL HIGH (ref 65–99)
Glucose-Capillary: 146 mg/dL — ABNORMAL HIGH (ref 65–99)
Glucose-Capillary: 58 mg/dL — ABNORMAL LOW (ref 65–99)
Glucose-Capillary: 90 mg/dL (ref 65–99)
Glucose-Capillary: 98 mg/dL (ref 65–99)

## 2017-09-02 MED ORDER — SALINE SPRAY 0.65 % NA SOLN
1.0000 | NASAL | Status: DC | PRN
  Filled 2017-09-02: qty 44

## 2017-09-02 MED ORDER — NITROGLYCERIN 0.4 MG SL SUBL
SUBLINGUAL_TABLET | SUBLINGUAL | Status: AC
  Filled 2017-09-02: qty 1

## 2017-09-02 MED ORDER — INSULIN GLARGINE 100 UNIT/ML ~~LOC~~ SOLN
12.0000 [IU] | Freq: Every day | SUBCUTANEOUS | Status: DC
Start: 2017-09-02 — End: 2017-09-04
  Administered 2017-09-02 – 2017-09-03 (×2): 12 [IU] via SUBCUTANEOUS
  Filled 2017-09-02 (×3): qty 0.12

## 2017-09-02 NOTE — Progress Notes (Signed)
Text Paged Dr Milus Height informing him regarding  C/o chest pain of patient. NTG given X2 . Pain now from 7 decreased to 1.

## 2017-09-02 NOTE — Progress Notes (Signed)
Chest pain gone down from 7 to 4. 2nd nitr .4 mg sl given. Continued to observe pt.

## 2017-09-02 NOTE — Progress Notes (Signed)
Pt complained of chest pain pressing in character. 7/10.VS taken.EKG done.  Nitro 0.4 mg SL given.

## 2017-09-02 NOTE — Progress Notes (Signed)
Inpatient Diabetes Program Recommendations  AACE/ADA: New Consensus Statement on Inpatient Glycemic Control (2015)  Target Ranges:  Prepandial:   less than 140 mg/dL      Peak postprandial:   less than 180 mg/dL (1-2 hours)      Critically ill patients:  140 - 180 mg/dL   Lab Results  Component Value Date   GLUCAP 90 09/02/2017   HGBA1C 11.8 (H) 05/29/2017    Review of Glycemic ControlResults for Emily Miranda, Emily Miranda (MRN [id number]) as of 09/02/2017 10:48  Ref. Range 09/01/2017 16:31 09/01/2017 20:43 09/02/2017 00:32 09/02/2017 07:26 09/02/2017 08:08  Glucose-Capillary Latest Ref Range: 65 - 99 mg/dL 861 (H) 683 (H) 98 58 (L) 90    Diabetes history: Type 2 DM Outpatient Diabetes medications:  Amaryl 2 mg bid, Tradjenta 5 mg daily Current orders for Inpatient glycemic control:  Novolog moderate tid with meals and HS Amaryl 2 mg bid, Tradjenta 5 mg daily, Lantus 18 units q HS Inpatient Diabetes Program Recommendations:   Fasting blood sugar continues to be low.  Please consider reducing Lantus to 12 units q HS. Also please hold Amaryl while in the hospital. Text page sent.  Thanks,  Beryl Meager, RN, BC-ADM Inpatient Diabetes Coordinator Pager [phone] (8a-5p)

## 2017-09-02 NOTE — Progress Notes (Addendum)
Progress Note  Patient Name: Emily Miranda Date of Encounter: 09/02/2017  Primary Cardiologist: Rollene Rotunda, MD   Subjective   Pt states her abdomen is significantly less tight. She remains on supplemental O2. Pt found mostly flat sleeping on her side  Inpatient Medications    Scheduled Meds: . aspirin  81 mg Oral Daily  . atorvastatin  40 mg Oral q1800  . carvedilol  9.375 mg Oral BID WC  . clopidogrel  75 mg Oral Daily  . cyclobenzaprine  10 mg Oral TID  . enoxaparin (LOVENOX) injection  30 mg Subcutaneous Q24H  . furosemide  40 mg Intravenous BID  . glimepiride  2 mg Oral Q breakfast  . hydrALAZINE  25 mg Oral Q8H  . insulin aspart  0-15 Units Subcutaneous TID WC  . insulin aspart  0-5 Units Subcutaneous QHS  . insulin glargine  18 Units Subcutaneous QHS  . isosorbide mononitrate  30 mg Oral Daily  . linagliptin  5 mg Oral Daily  . lisinopril  30 mg Oral Daily  . loratadine  10 mg Oral Daily  . pantoprazole  40 mg Oral Q0600  . polyethylene glycol  17 g Oral Daily  . potassium chloride  20 mEq Oral Daily  . silver sulfADIAZINE   Topical Daily   Continuous Infusions:  PRN Meds: acetaminophen, ALPRAZolam, HYDROcodone-acetaminophen, nitroGLYCERIN, ondansetron (ZOFRAN) IV, sodium chloride flush   Vital Signs    Vitals:   09/01/17 1256 09/01/17 2041 09/02/17 0451 09/02/17 0454  BP: 140/66 (!) 149/64 (!) 148/59   Pulse: 65 62 68   Resp:      Temp: 97.9 F (36.6 C)  98.5 F (36.9 C)   TempSrc: Oral  Oral   SpO2: 98% 98% 99%   Weight:    156 lb 6.4 oz (70.9 kg)  Height:        Intake/Output Summary (Last 24 hours) at 09/02/2017 0922 Last data filed at 09/02/2017 0458 Gross per 24 hour  Intake 360 ml  Output 2100 ml  Net -1740 ml   Filed Weights   08/30/17 0706 08/31/17 0638 09/02/17 0454  Weight: 165 lb 4.8 oz (75 kg) 159 lb 4.8 oz (72.3 kg) 156 lb 6.4 oz (70.9 kg)    Telemetry    sinus - Personally Reviewed  ECG    No new tracings -  Personally Reviewed  Physical Exam   GEN: No acute distress.   Neck: + JVD, improved Cardiac: RRR, + murmur Respiratory: Clear to auscultation bilaterally, diminished in bases GI: Soft, nontender, non-distended  MS: + edema with chronic skin changes, right LE wrapped Neuro:  Nonfocal  Psych: Normal affect   Labs    Chemistry Recent Labs  Lab 08/28/17 1916 08/30/17 0528 08/31/17 0839 09/01/17 0712  NA 141 140 139 137  K 4.4 4.3 4.7 4.8  CL 116* 114* 111 110  CO2 16* 19* 21* 22  GLUCOSE 252* 80 96 49*  BUN 29* 32* 37* 35*  CREATININE 1.71* 1.69* 1.81* 1.67*  CALCIUM 8.4* 8.5* 8.7* 8.2*  PROT 5.6*  --   --   --   ALBUMIN 2.5*  --   --   --   AST 14*  --   --   --   ALT 13*  --   --   --   ALKPHOS 91  --   --   --   BILITOT 0.5  --   --   --   GFRNONAA 28* 28* 26*  29*  GFRAA 33* 33* 30* 33*  ANIONGAP 9 7 7 5      Hematology Recent Labs  Lab 08/28/17 1916  WBC 6.1  RBC 2.84*  HGB 7.7*  HCT 25.0*  MCV 88.0  MCH 27.1  MCHC 30.8  RDW 15.3  PLT 231    Cardiac EnzymesNo results for input(s): TROPONINI in the last 168 hours. No results for input(s): TROPIPOC in the last 168 hours.   BNP Recent Labs  Lab 08/28/17 1916  BNP 2,542.7*     DDimer No results for input(s): DDIMER in the last 168 hours.   Radiology    No results found.  Cardiac Studies   PCI 11/05/16:  Mid RCA lesion, 50 %stenosed.  A STENT RESOLUTE ONYX 2.0X12 drug eluting stent was successfully placed.  Ost RPDA to RPDA lesion, 95 %stenosed.  Post intervention, there is a 0% residual stenosis.  A STENT RESOLUTE ONYX 2.25X15 drug eluting stent was successfully placed.  Prox RCA to Mid RCA lesion, 70 %stenosed.  Post intervention, there is a 0% residual stenosis.  Dist RCA lesion, 40 %stenosed.  Successful percutaneous coronary intervention to a 95% proximal PDA stenosis and a small caliber vessel, treated with a Wolferine 2.010 mm Cutting Balloon and DES stenting with a 2.012  mm Resolute Onyx stent postdilated to 2.1 mm with the 95% stenosis being reduced to 0%.  Successful PCI to the mid RCA with Cutting Balloon atherotomy and DES stenting with a 2.2515 mm Resolute Onyx DES stent postdilated with a 2.5 noncompliant balloon and the tandem stenoses being reduced to 0%.   Echo 07/30/16: Study Conclusions - Left ventricle: The cavity size was normal. Wall thickness was normal. Systolic function was mildly to moderately reduced. The estimated ejection fraction was in the range of 40% to 45%. Akinesis of the midanteroseptal myocardium. Features are consistent with a pseudonormal left ventricular filling pattern, with concomitant abnormal relaxation and increased filling pressure ( grade 2 diastolic dysfunction). - Aortic valve: Mildly to moderately calcified annulus. Mildly thickened, mildly calcified leaflets. There was mild stenosis. There was trivial regurgitation. Valve area (VTI): 1.31 cm^2. Valve area (Vmax): 1.28 cm^2. Valve area (Vmean): 1.26 cm^2. - Mitral valve: There was mild regurgitation. - Left atrium: The atrium was moderately dilated. - Pericardium, extracardiac: A trivial pericardial effusion was identified. There was a left pleural effusion.  Patient Profile     77 y.o. female with a history of Chronic combined systolic and diastolic CHF who was admitted from the office with CHF.  Assessment & Plan    1. Acute on chronic systolic and diastolic heart failure - echo last year with LVEF 40-45% and grade 2 DD - patient diuresed on 40 mg IV lasix BID - labs pending today, sCr had trended down yesterday - continue lasix regimen today - she is overall net negative 8 L with 3 L urine output yesterday - she states she feels he abdomen tightness is much improved - will continue with this IV diuresis regimen for at least 1 more day, will observe clinically tomorrow for transition to PO lasix  2. CAD s/p recent PCI with dES  to proximal PDA and DES to mid RCA - no chest pain - continue ASA and plavix  3. PAD - needs right ADA - to be done at [facility]  4. DM2 - was hypoglycemic yesterday - insulin adjusted per diabetes coordinator yesterday (decreased lantus to 18 U qhs) - BG was 58 this morning, will await recommendations from DM coordinator  For questions or updates, please contact [facility] Please consult [email or web address] for contact info under Cardiology/STEMI.      Signed, Marcelino Duster, PA  09/02/2017, 9:22 AM    Patient seen and examined  I agree with findings as noted above by A Duke PT appears comfortable in bed Neck:  JVP normal  Lungs are rel clear    Cardiac exam  RRR   No S3   Abd is supple and nontender   Ext without signif ischemia  REcomm Agree to stop IV diuretics tomorrow  Follow I/O and renal function.    Dietrich Pates

## 2017-09-03 LAB — BASIC METABOLIC PANEL
Anion gap: 5 (ref 5–15)
BUN: 47 mg/dL — ABNORMAL HIGH (ref 6–20)
CO2: 27 mmol/L (ref 22–32)
Calcium: 8.6 mg/dL — ABNORMAL LOW (ref 8.9–10.3)
Chloride: 111 mmol/L (ref 101–111)
Creatinine, Ser: 1.89 mg/dL — ABNORMAL HIGH (ref 0.44–1.00)
GFR calc Af Amer: 29 mL/min — ABNORMAL LOW (ref >60)
GFR calc non Af Amer: 25 mL/min — ABNORMAL LOW (ref >60)
Glucose, Bld: 81 mg/dL (ref 65–99)
Potassium: 4.9 mmol/L (ref 3.5–5.1)
Sodium: 143 mmol/L (ref 135–145)

## 2017-09-03 LAB — GLUCOSE, CAPILLARY
Glucose-Capillary: 78 mg/dL (ref 65–99)
Glucose-Capillary: 107 mg/dL — ABNORMAL HIGH (ref 65–99)
Glucose-Capillary: 173 mg/dL — ABNORMAL HIGH (ref 65–99)
Glucose-Capillary: 147 mg/dL — ABNORMAL HIGH (ref 65–99)
Glucose-Capillary: 161 mg/dL — ABNORMAL HIGH (ref 65–99)

## 2017-09-03 MED ORDER — FUROSEMIDE 40 MG PO TABS
40.0000 mg | ORAL_TABLET | Freq: Every day | ORAL | Status: DC
  Administered 2017-09-04: 40 mg via ORAL
  Filled 2017-09-03: qty 1

## 2017-09-03 NOTE — Plan of Care (Signed)
°  Problem: Clinical Measurements: °Goal: Ability to maintain clinical measurements within normal limits will improve °Outcome: Progressing °  °Problem: Clinical Measurements: °Goal: Diagnostic test results will improve °Outcome: Progressing °  °

## 2017-09-03 NOTE — Plan of Care (Signed)
  Problem: Clinical Measurements: Goal: Ability to maintain clinical measurements within normal limits will improve Outcome: Progressing   Problem: Clinical Measurements: Goal: Diagnostic test results will improve Outcome: Progressing   Problem: Clinical Measurements: Goal: Cardiovascular complication will be avoided Outcome: Progressing   

## 2017-09-03 NOTE — Progress Notes (Addendum)
Progress Note  Patient Name: Emily Miranda Date of Encounter: 09/03/2017  Primary Cardiologist: Rollene Rotunda, MD   Subjective   Pt remains on , likely not needed. Will wean to room air today. Improving abdominal and LE edema  Inpatient Medications    Scheduled Meds: . aspirin  81 mg Oral Daily  . atorvastatin  40 mg Oral q1800  . carvedilol  9.375 mg Oral BID WC  . clopidogrel  75 mg Oral Daily  . cyclobenzaprine  10 mg Oral TID  . enoxaparin (LOVENOX) injection  30 mg Subcutaneous Q24H  . furosemide  40 mg Intravenous BID  . hydrALAZINE  25 mg Oral Q8H  . insulin aspart  0-15 Units Subcutaneous TID WC  . insulin aspart  0-5 Units Subcutaneous QHS  . insulin glargine  12 Units Subcutaneous QHS  . isosorbide mononitrate  30 mg Oral Daily  . linagliptin  5 mg Oral Daily  . lisinopril  30 mg Oral Daily  . loratadine  10 mg Oral Daily  . pantoprazole  40 mg Oral Q0600  . polyethylene glycol  17 g Oral Daily  . potassium chloride  20 mEq Oral Daily  . silver sulfADIAZINE   Topical Daily   Continuous Infusions:  PRN Meds: acetaminophen, ALPRAZolam, HYDROcodone-acetaminophen, nitroGLYCERIN, ondansetron (ZOFRAN) IV, sodium chloride, sodium chloride flush   Vital Signs    Vitals:   09/02/17 1537 09/02/17 2011 09/02/17 2325 09/03/17 0512  BP: (!) 164/71 (!) 138/56 137/61   Pulse: 73 71 75   Resp: 18     Temp: 97.6 F (36.4 C)     TempSrc: Oral     SpO2: 100% 97%    Weight:    152 lb 6.4 oz (69.1 kg)  Height:        Intake/Output Summary (Last 24 hours) at 09/03/2017 0843 Last data filed at 09/03/2017 0600 Gross per 24 hour  Intake 600 ml  Output 3575 ml  Net -2975 ml   Filed Weights   08/31/17 0638 09/02/17 0454 09/03/17 0512  Weight: 159 lb 4.8 oz (72.3 kg) 156 lb 6.4 oz (70.9 kg) 152 lb 6.4 oz (69.1 kg)    Telemetry    sinus - Personally Reviewed  ECG    No new tracings - Personally Reviewed  Physical Exam   GEN: No acute distress.   Neck:  No JVD Cardiac: RRR, no murmurs, rubs, or gallops.  Respiratory: respirations unlabored, improved crackles in bases GI: Soft, nontender, non-distended  MS: trace LE edema; No deformity. Neuro:  Nonfocal  Psych: Normal affect   Labs    Chemistry Recent Labs  Lab 08/28/17 1916  09/01/17 0712 09/02/17 1004 09/03/17 0511  NA 141   < > 137 139 143  K 4.4   < > 4.8 5.1 4.9  CL 116*   < > 110 110 111  CO2 16*   < > 22 22 27   GLUCOSE 252*   < > 49* 115* 81  BUN 29*   < > 35* 42* 47*  CREATININE 1.71*   < > 1.67* 1.81* 1.89*  CALCIUM 8.4*   < > 8.2* 8.5* 8.6*  PROT 5.6*  --   --   --   --   ALBUMIN 2.5*  --   --   --   --   AST 14*  --   --   --   --   ALT 13*  --   --   --   --  ALKPHOS 91  --   --   --   --   BILITOT 0.5  --   --   --   --   GFRNONAA 28*   < > 29* 26* 25*  GFRAA 33*   < > 33* 30* 29*  ANIONGAP 9   < > 5 7 5    < > = values in this interval not displayed.     Hematology Recent Labs  Lab 08/28/17 1916  WBC 6.1  RBC 2.84*  HGB 7.7*  HCT 25.0*  MCV 88.0  MCH 27.1  MCHC 30.8  RDW 15.3  PLT 231    Cardiac EnzymesNo results for input(s): TROPONINI in the last 168 hours. No results for input(s): TROPIPOC in the last 168 hours.   BNP Recent Labs  Lab 08/28/17 1916  BNP 2,542.7*     DDimer No results for input(s): DDIMER in the last 168 hours.   Radiology    No results found.  Cardiac Studies   PCI 11/05/16:  Mid RCA lesion, 50 %stenosed.  A STENT RESOLUTE ONYX 2.0X12 drug eluting stent was successfully placed.  Ost RPDA to RPDA lesion, 95 %stenosed.  Post intervention, there is a 0% residual stenosis.  A STENT RESOLUTE ONYX 2.25X15 drug eluting stent was successfully placed.  Prox RCA to Mid RCA lesion, 70 %stenosed.  Post intervention, there is a 0% residual stenosis.  Dist RCA lesion, 40 %stenosed.  Successful percutaneous coronary intervention to a 95% proximal PDA stenosis and a small caliber vessel, treated with a Wolferine  2.010 mm Cutting Balloon and DES stenting with a 2.012 mm Resolute Onyx stent postdilated to 2.1 mm with the 95% stenosis being reduced to 0%.  Successful PCI to the mid RCA with Cutting Balloon atherotomy and DES stenting with a 2.2515 mm Resolute Onyx DES stent postdilated with a 2.5 noncompliant balloon and the tandem stenoses being reduced to 0%.   Echo 07/30/16: Study Conclusions - Left ventricle: The cavity size was normal. Wall thickness was normal. Systolic function was mildly to moderately reduced. The estimated ejection fraction was in the range of 40% to 45%. Akinesis of the midanteroseptal myocardium. Features are consistent with a pseudonormal left ventricular filling pattern, with concomitant abnormal relaxation and increased filling pressure ( grade 2 diastolic dysfunction). - Aortic valve: Mildly to moderately calcified annulus. Mildly thickened, mildly calcified leaflets. There was mild stenosis. There was trivial regurgitation. Valve area (VTI): 1.31 cm^2. Valve area (Vmax): 1.28 cm^2. Valve area (Vmean): 1.26 cm^2. - Mitral valve: There was mild regurgitation. - Left atrium: The atrium was moderately dilated. - Pericardium, extracardiac: A trivial pericardial effusion was identified. There was a left pleural effusion.  Patient Profile     77 y.o. female with a history of Chronic combined systolic and diastolic CHF who was admitted from the office with CHF.  Assessment & Plan    1. Acute on chronic systolic and diastolic heart failure - echo last year with LVEF of 40-45% and grade 2 DD - diuresing on 40 mg IV lasix BID - she is overall net negative 11 L with 3.5 L urine output yesterday - weight is 153 lbs from 167 lbs - she has already received a dose of IV lasix this morning. Will D/C afternoon IV lasix dose and start PO lasix 40 mg daily tomorrow   2. Acute on chronic kidney disease stage 3 - baseline appears to be 1.5 - sCr 1.89  (1.81) - creatinine has been trending up, will  decrease lasix for today   3. CAD s/p recent PCI with DES to proximal PDA and DES to mid RCA - stable, no chest pain - continue ASA and plavix   4. PAD - right AKA - scheduled at [facility]   5. DM2 - hypoglycemic yesterday morning again - further adjustments made to insulin regimen per diabetes coordinator - this morning BG was 78   Anticipate that she may be ready for discharge tomorrow from a cardiac standpoint. Will need BMP in 1 week.    For questions or updates, please contact [facility] Please consult [email or web address] for contact info under Cardiology/STEMI.      Signed, Roe Rutherford Duke, PA  09/03/2017, 8:43 AM    Pt seen and examined   I agree with findingas as noted above by A Duke Pt comfortable   Has diuresed signif since adit On exam, Lungs are rel clear  Cardiac exam RRR   NO  S3   Ext withut edema  I agree with findings asa noted by A Duke   Would switch to oral angents  Follow Cr in AM   Close to d/c  Dietrich Pates

## 2017-09-03 NOTE — Care Management Important Message (Signed)
Important Message  Patient Details  Name: Emily Miranda MRN: [id number] Date of Birth: [birth year]   Medicare Important Message Given:  Yes    Nicosha Struve P Jullian Previti 09/03/2017, 3:00 PM

## 2017-09-04 LAB — BASIC METABOLIC PANEL
Anion gap: 6 (ref 5–15)
BUN: 47 mg/dL — ABNORMAL HIGH (ref 6–20)
CO2: 23 mmol/L (ref 22–32)
Calcium: 8.3 mg/dL — ABNORMAL LOW (ref 8.9–10.3)
Chloride: 109 mmol/L (ref 101–111)
Creatinine, Ser: 1.86 mg/dL — ABNORMAL HIGH (ref 0.44–1.00)
GFR calc Af Amer: 29 mL/min — ABNORMAL LOW (ref >60)
GFR calc non Af Amer: 25 mL/min — ABNORMAL LOW (ref >60)
Glucose, Bld: 228 mg/dL — ABNORMAL HIGH (ref 65–99)
Potassium: 5.2 mmol/L — ABNORMAL HIGH (ref 3.5–5.1)
Sodium: 138 mmol/L (ref 135–145)

## 2017-09-04 LAB — GLUCOSE, CAPILLARY
Glucose-Capillary: 175 mg/dL — ABNORMAL HIGH (ref 65–99)
Glucose-Capillary: 191 mg/dL — ABNORMAL HIGH (ref 65–99)

## 2017-09-04 MED ORDER — CARVEDILOL 3.125 MG PO TABS: 9.3750 mg | ORAL_TABLET | Freq: Two times a day (BID) | ORAL | 11 refills | Status: DC

## 2017-09-04 MED ORDER — FUROSEMIDE 40 MG PO TABS: 40.0000 mg | ORAL_TABLET | Freq: Every day | ORAL | 5 refills | Status: AC

## 2017-09-04 MED ORDER — ASPIRIN EC 81 MG PO TBEC: 81.0000 mg | DELAYED_RELEASE_TABLET | Freq: Every day | ORAL | 2 refills | Status: AC

## 2017-09-04 MED ORDER — INSULIN GLARGINE 100 UNIT/ML ~~LOC~~ SOLN: 12.0000 [IU] | Freq: Every day | SUBCUTANEOUS | 11 refills | Status: AC

## 2017-09-04 MED ORDER — ASPIRIN 81 MG PO CHEW: 81.0000 mg | CHEWABLE_TABLET | Freq: Every day | ORAL | 3 refills | Status: DC

## 2017-09-04 NOTE — Care Management Note (Signed)
Case Management Note  Patient Details  Name: Emily Miranda MRN: [id number] Date of Birth: 12/29/1940  Subjective/Objective:     CHF              Action/Plan: Patient lives at home; Primary Care Provider: Noni Saupe, MD; has private insurance with [facility] with prescription drug coverage; she is active with [facility] as prior to admission for HHRN/ PT; [name] with [facility] called for continuation of care; DME - walker and wheelchair at home; CM will continue to follow for progression of care.  Expected Discharge Date:    possibly 09/04/2017             Expected Discharge Plan:  Home w Home Health Services  In-House Referral:  [facility], Chaplain  Discharge planning Services  CM Consult  HH Arranged:  RN, PT, Disease Management HH Agency:  [facility]  Status of Service:  In process, will continue to follow  [name] [phone] 09/04/2017, 10:18 AM

## 2017-09-04 NOTE — Discharge Summary (Addendum)
Discharge Summary    Patient ID: LINDAMARIE MACLACHLAN,  MRN: [id number], DOB/AGE: Jul 20, 1940 77 y.o.  Admit date: 08/28/2017 Discharge date: 09/04/2017  Primary Care Provider: Arvin Collard II Primary Cardiologist: Rollene Rotunda, MD  Discharge Diagnoses    Active Problems:   Acute on chronic combined systolic and diastolic CHF (congestive heart failure) (HCC)   Acute on chronic combined systolic and diastolic HF (heart failure) (HCC)   Allergies Allergies  Allergen Reactions  . Levofloxacin Nausea And Vomiting and Other (See Comments)    Confusion  . Amlodipine Other (See Comments)    Localized edema  . Gabapentin Other (See Comments)    Disoriented, no strength in legs  . Hydrochlorothiazide Other (See Comments)    Hot and disoriented  . Ibuprofen Other (See Comments)    Affected kidneys - stopped flow of urine Reaction to Advil  . Metformin And Related Other (See Comments)    Affected kidneys - stopped flow of urine  . Simvastatin Other (See Comments)    Chest pain    Diagnostic Studies/Procedures    None _____________   History of Present Illness     Ms. Haddox is a 76y.o.femalewith history of mild to moderate aortic stenosis, EF 40-45%, HCVD-grade 2 DD, IDDMwithCharcotchronic Rt foot wound, and CRI-3. She was admitted 10/06/16 with a NSTEMI. Her course was complicated by CHF requiring several days diuresis (her cath was delayed secondary to persistent CHF), anemia requiring transfusion, and acute on CRI. She ultimately underwent cath 10/11/16 which showed moderate, diffuse3v disease. The planwasfor medical therapy.She was seen in follow up 10/31/16. Mobility was a big problem, her Rt foot was unusable and she couldn't walk with PT. She has known Rt SFA disease but the benefitof intervention vs risk was not clearthenas her Rt foot ulcer was healing.   She then presented to [facility] 11/04/16 with chest pain andapositive Troponin. On 11/07/16 she underwent  intervention to two sites in her [facility] Dr Tresa Endo. She had chest pain post PCI but her Troponin was flat and no further intervention was planned. She was noted to be volume overloaded on f/u in July and Zaroxolyn 2.5 mg 3 times a week was added. In Jan 2019 she was admitted with AKI and her diuretic were backed off. She has not been seen in our office since.  She has been followed at [facility] and was admitted 08/01/17 with progressive PVD and reportedly needs a R AKA. Apparently during the evaluation she underwent cardiac cath and had two DES placed. She was referred to [place] for per op clearance and upon presentation on 08/28/17 she complained of swelling in her thighs and abdomin. Her weight was 160 lbs, last office weight in Jan was 142 lbs.She was not having any recurrent anginal type symptomatology following her recent 2 vessel PCI to LAD and circumflex vessels.  She was sent from the office to [facility] for admission and IV diuresis.   Hospital Course     Consultants: Diabetes cooordinator  1. Acute on chronic systolic and diastolic CHF -Echo last year with LVEF 40-45% and grade 2 DD. Managed at home with GDMT with carvedilol, ACE-I, imdur, hydralazine, lasix 20 mg daily prn. -Diuresed well with lasix 40 mg IV BID and switched to oral dosing.  -UOP 1.7 in last 24 hrs. Overall fluid balance is net negative 11.9L. -Wt is down 15 lbs since admission, no change from yesterday to today. Current wt 152 lbs.  -SCr is stable at 1.86. K+  5.2 today.  -Clinically improved, ready for discharge, although the patient feels that she still has some fluid retention and says dry wt is about 140 lbs. Will continue lasix 40 mg daily and close follow up in the office.   2. Acute on chronic CKD stage 3 -Baseline SCr ~1.5 -Scr is stable at 1.86 (1.89 yest) with diuresis. K+ 5.2 today.  -Will recheck BMet at follow up.   3. CAD s/p recent PCI with DES to proximal PDA and DES to mid RCA - stable, no chest pain -  continue ASA and plavix  4. Hypertension -BP has been well controlled, mildly elevated this am. Continue current therapy  5. PAD -With severe deformity of right foot. R AKA planned at [facility].  6. DM type 2 -fasting blood sugar has been low. Adjustments made per diabetes coordinator recommendations. Lantus reduced. Will discontinue amaryl due to GFR and age.  -Will need to follow up with PCP.   Patient has been seen by Dr. Tenny Craw today and deemed ready for discharge home. All follow up appointments have been scheduled. Discharge medications are listed below. _____________  Discharge Vitals Blood pressure (!) 156/62, pulse 73, temperature 98.1 F (36.7 C), temperature source Oral, resp. rate 16, height 5\' 6"  (1.676 m), weight 152 lb 11.2 oz (69.3 kg), SpO2 92 %.  Filed Weights   09/02/17 0454 09/03/17 0512 09/04/17 0438  Weight: 156 lb 6.4 oz (70.9 kg) 152 lb 6.4 oz (69.1 kg) 152 lb 11.2 oz (69.3 kg)    Labs & Radiologic Studies    CBC No results for input(s): WBC, NEUTROABS, HGB, HCT, MCV, PLT in the last 72 hours. Basic Metabolic Panel Recent Labs    54/00/86 0511 09/04/17 0411  NA 143 138  K 4.9 5.2*  CL 111 109  CO2 27 23  GLUCOSE 81 228*  BUN 47* 47*  CREATININE 1.89* 1.86*  CALCIUM 8.6* 8.3*   Liver Function Tests No results for input(s): AST, ALT, ALKPHOS, BILITOT, PROT, ALBUMIN in the last 72 hours. No results for input(s): LIPASE, AMYLASE in the last 72 hours. Cardiac Enzymes No results for input(s): CKTOTAL, CKMB, CKMBINDEX, TROPONINI in the last 72 hours. BNP Invalid input(s): POCBNP D-Dimer No results for input(s): DDIMER in the last 72 hours. Hemoglobin A1C No results for input(s): HGBA1C in the last 72 hours. Fasting Lipid Panel No results for input(s): CHOL, HDL, LDLCALC, TRIG, CHOLHDL, LDLDIRECT in the last 72 hours. Thyroid Function Tests No results for input(s): TSH, T4TOTAL, T3FREE, THYROIDAB in the last 72 hours.  Invalid input(s):  FREET3 _____________  No results found. Disposition   Pt is being discharged home today in good condition.  Follow-up Plans & Appointments    Follow-up Information    Noni Saupe, MD Follow up.   Specialty:  Family Medicine Contact information: [address] [place] [address] [phone]        [facility] Follow up.   Specialty:  Home Health Services Why:  They will continue to do your home health care at your home Contact information: [address] [place] [address] [phone]        Barrett, Joline Salt, PA-C Follow up.   Specialties:  Cardiology, Radiology Why:  Cardiology hospital follow up on April 30th at 11:30. You will have labs done to check kidneys. Please arrive 15 minutes early for check in.  Contact information: [address] [place] [address] [phone]  Discharge Instructions    (HEART FAILURE PATIENTS) Call MD:  Anytime you have any of the following symptoms: 1) 3 pound weight gain in 24 hours or 5 pounds in 1 week 2) shortness of breath, with or without a dry hacking cough 3) swelling in the hands, feet or stomach 4) if you have to sleep on extra pillows at night in order to breathe.   Complete by:  As directed    Diet - low sodium heart healthy   Complete by:  As directed    Increase activity slowly   Complete by:  As directed       Discharge Medications   Allergies as of 09/04/2017      Reactions   Levofloxacin Nausea And Vomiting, Other (See Comments)   Confusion   Amlodipine Other (See Comments)   Localized edema   Gabapentin Other (See Comments)   Disoriented, no strength in legs   Hydrochlorothiazide Other (See Comments)   Hot and disoriented   Ibuprofen Other (See Comments)   Affected kidneys - stopped flow of urine Reaction to Advil   Metformin And Related Other (See Comments)   Affected kidneys - stopped flow of urine   Simvastatin Other (See  Comments)   Chest pain      Medication List    STOP taking these medications   aspirin-acetaminophen-caffeine 250-250-65 MG tablet Commonly known as:  EXCEDRIN MIGRAINE   glimepiride 2 MG tablet Commonly known as:  AMARYL   LANTUS SOLOSTAR 100 UNIT/ML Solostar Pen Generic drug:  Insulin Glargine Replaced by:  insulin glargine 100 UNIT/ML injection     TAKE these medications   aspirin EC 81 MG tablet Take 1 tablet (81 mg total) by mouth daily.   atorvastatin 40 MG tablet Commonly known as:  LIPITOR Take 1 tablet (40 mg total) by mouth daily at 6 PM.   carvedilol 3.125 MG tablet Commonly known as:  COREG Take 3 tablets (9.375 mg total) by mouth 2 (two) times daily with a meal. What changed:    medication strength  how much to take   clopidogrel 75 MG tablet Commonly known as:  PLAVIX Take 1 tablet (75 mg total) by mouth daily with breakfast.   collagenase ointment Commonly known as:  SANTYL Apply topically daily. Apply to LE plantar surface daily.   cyclobenzaprine 5 MG tablet Commonly known as:  FLEXERIL Take 10 mg by mouth 3 (three) times daily.   diclofenac sodium 1 % Gel Commonly known as:  VOLTAREN Apply 2 g topically 4 (four) times daily. Please apply to let hip.   fexofenadine 180 MG tablet Commonly known as:  ALLEGRA Take 180 mg by mouth daily.   fluticasone 50 MCG/ACT nasal spray Commonly known as:  FLONASE Place 1 spray into both nostrils daily as needed (seasonal allergies).   furosemide 40 MG tablet Commonly known as:  LASIX Take 1 tablet (40 mg total) by mouth daily. Start taking on:  09/05/2017 What changed:    how much to take  when to take this  reasons to take this   hydrALAZINE 25 MG tablet Commonly known as:  APRESOLINE Take 25 mg by mouth every 8 (eight) hours.   HYDROcodone-acetaminophen 5-325 MG tablet Commonly known as:  NORCO/VICODIN Take 1 tablet by mouth every 6 (six) hours as needed for moderate pain.   insulin  glargine 100 UNIT/ML injection Commonly known as:  LANTUS Inject 0.12 mLs (12 Units total) into the skin at bedtime. Replaces:  LANTUS SOLOSTAR 100  UNIT/ML Solostar Pen   isosorbide mononitrate 30 MG 24 hr tablet Commonly known as:  IMDUR Take 1 tablet (30 mg total) by mouth daily.   linagliptin 5 MG Tabs tablet Commonly known as:  TRADJENTA Take 5 mg by mouth daily.   lisinopril 30 MG tablet Commonly known as:  PRINIVIL,ZESTRIL Take 30 mg by mouth daily.   mupirocin cream 2 % Commonly known as:  BACTROBAN Apply 1 application topically 2 (two) times daily.   nitroGLYCERIN 0.4 MG SL tablet Commonly known as:  NITROSTAT Place 1 tablet (0.4 mg total) under the tongue every 5 (five) minutes x 3 doses as needed for chest pain.   pantoprazole 40 MG tablet Commonly known as:  PROTONIX Take 1 tablet (40 mg total) by mouth daily.   polyethylene glycol packet Commonly known as:  MIRALAX / GLYCOLAX Take 17 g by mouth daily as needed (constipation). Mix in 8 oz liquid and drink   silver sulfADIAZINE 1 % cream Commonly known as:  SILVADENE Apply topically daily. Apply to right lower extremities affected areas, not to the plantar region   SYSTANE OP Place 1 drop into both eyes daily as needed (dry eyes).   vitamin C 1000 MG tablet Take 1,000 mg by mouth daily.         Outstanding Labs/Studies   BMet at follow up  Duration of Discharge Encounter   Greater than 30 minutes including physician time.  Signed, Nunzio Cobbs, NP 09/04/2017, 11:35 AM  Pt seen earlier in day   Doing well  Stable for discharge.  Dietrich Pates

## 2017-09-04 NOTE — Progress Notes (Signed)
Results for Emily Miranda, Emily Miranda (MRN [id number]) as of 09/04/2017 09:04  Ref. Range 09/03/2017 07:29 09/03/2017 11:43 09/03/2017 16:44 09/03/2017 21:06 09/04/2017 07:58  Glucose-Capillary Latest Ref Range: 65 - 99 mg/dL 878 (H) 676 (H) 720 (H) 161 (H) 175 (H)  Received diabetes coordinator consult in regards to discharge diabetes meds. Recommend Lantus 12 units daily and Tradjenta 5 mg daily. Recommend discontinuing Amaryl due to GFR and age. Will need follow up with PCP after discharge.   Smith Mince RN BSN CDE Diabetes Coordinator Pager: [phone]  8am-5pm

## 2017-09-04 NOTE — Progress Notes (Addendum)
Progress Note  Patient Name: Emily Miranda Date of Encounter: 09/04/2017  Primary Cardiologist: Rollene Rotunda, MD   Subjective   Pt is sleepy, just woke up. Had pain meds for leg pain. She is  breathing better but feels that she still has fluid in her abdominal area. She has not been out of bed, great difficulty due to her feet.   Inpatient Medications    Scheduled Meds: . aspirin  81 mg Oral Daily  . atorvastatin  40 mg Oral q1800  . carvedilol  9.375 mg Oral BID WC  . clopidogrel  75 mg Oral Daily  . cyclobenzaprine  10 mg Oral TID  . enoxaparin (LOVENOX) injection  30 mg Subcutaneous Q24H  . furosemide  40 mg Oral Daily  . hydrALAZINE  25 mg Oral Q8H  . insulin aspart  0-15 Units Subcutaneous TID WC  . insulin aspart  0-5 Units Subcutaneous QHS  . insulin glargine  12 Units Subcutaneous QHS  . isosorbide mononitrate  30 mg Oral Daily  . linagliptin  5 mg Oral Daily  . lisinopril  30 mg Oral Daily  . loratadine  10 mg Oral Daily  . pantoprazole  40 mg Oral Q0600  . polyethylene glycol  17 g Oral Daily  . potassium chloride  20 mEq Oral Daily  . silver sulfADIAZINE   Topical Daily   Continuous Infusions:  PRN Meds: acetaminophen, ALPRAZolam, HYDROcodone-acetaminophen, nitroGLYCERIN, ondansetron (ZOFRAN) IV, sodium chloride, sodium chloride flush   Vital Signs    Vitals:   09/03/17 1143 09/03/17 1738 09/03/17 2013 09/04/17 0438  BP: 115/67 (!) 131/59 (!) 136/55 (!) 150/57  Pulse: 73 70 73 78  Resp: 18  18 16   Temp: 98.4 F (36.9 C)  98.1 F (36.7 C) 98.1 F (36.7 C)  TempSrc: Oral  Oral Oral  SpO2: 94% 95% 95% 92%  Weight:    152 lb 11.2 oz (69.3 kg)  Height:        Intake/Output Summary (Last 24 hours) at 09/04/2017 0831 Last data filed at 09/04/2017 0556 Gross per 24 hour  Intake 960 ml  Output 1700 ml  Net -740 ml   Filed Weights   09/02/17 0454 09/03/17 0512 09/04/17 0438  Weight: 156 lb 6.4 oz (70.9 kg) 152 lb 6.4 oz (69.1 kg) 152 lb 11.2 oz  (69.3 kg)    Telemetry    Sinus rhythm in the 70's - Personally Reviewed  ECG    No new tracing - Personally Reviewed  Physical Exam   GEN: No acute distress.   Neck: No JVD Cardiac: RRR, no murmurs, rubs, or gallops.  Respiratory: Clear to auscultation bilaterally. GI: Soft, nontender, non-distended  MS: No edema; severe charcot foot on right and some deformity on left foot also Neuro:  Nonfocal  Psych: Normal affect   Labs    Chemistry Recent Labs  Lab 08/28/17 1916  09/02/17 1004 09/03/17 0511 09/04/17 0411  NA 141   < > 139 143 138  K 4.4   < > 5.1 4.9 5.2*  CL 116*   < > 110 111 109  CO2 16*   < > 22 27 23   GLUCOSE 252*   < > 115* 81 228*  BUN 29*   < > 42* 47* 47*  CREATININE 1.71*   < > 1.81* 1.89* 1.86*  CALCIUM 8.4*   < > 8.5* 8.6* 8.3*  PROT 5.6*  --   --   --   --   ALBUMIN  2.5*  --   --   --   --   AST 14*  --   --   --   --   ALT 13*  --   --   --   --   ALKPHOS 91  --   --   --   --   BILITOT 0.5  --   --   --   --   GFRNONAA 28*   < > 26* 25* 25*  GFRAA 33*   < > 30* 29* 29*  ANIONGAP 9   < > 7 5 6    < > = values in this interval not displayed.     Hematology Recent Labs  Lab 08/28/17 1916  WBC 6.1  RBC 2.84*  HGB 7.7*  HCT 25.0*  MCV 88.0  MCH 27.1  MCHC 30.8  RDW 15.3  PLT 231    Cardiac EnzymesNo results for input(s): TROPONINI in the last 168 hours. No results for input(s): TROPIPOC in the last 168 hours.   BNP Recent Labs  Lab 08/28/17 1916  BNP 2,542.7*     DDimer No results for input(s): DDIMER in the last 168 hours.   Radiology    No results found.  Cardiac Studies   PCI 11/05/16:  Mid RCA lesion, 50 %stenosed.  A STENT RESOLUTE ONYX 2.0X12 drug eluting stent was successfully placed.  Ost RPDA to RPDA lesion, 95 %stenosed.  Post intervention, there is a 0% residual stenosis.  A STENT RESOLUTE ONYX 2.25X15 drug eluting stent was successfully placed.  Prox RCA to Mid RCA lesion, 70 %stenosed.  Post  intervention, there is a 0% residual stenosis.  Dist RCA lesion, 40 %stenosed.  Successful percutaneous coronary intervention to a 95% proximal PDA stenosis and a small caliber vessel, treated with a Wol[place] 2.010 mm Cutting Balloon and DES stenting with a 2.012 mm Resolute Onyx stent postdilated to 2.1 mm with the 95% stenosis being reduced to 0%.  Successful PCI to the mid RCA with Cutting Balloon atherotomy and DES stenting with a 2.2515 mm Resolute Onyx DES stent postdilated with a 2.5 noncompliant balloon and the tandem stenoses being reduced to 0%.   Echo 07/30/16: Study Conclusions - Left ventricle: The cavity size was normal. Wall thickness was normal. Systolic function was mildly to moderately reduced. The estimated ejection fraction was in the range of 40% to 45%. Akinesis of the midanteroseptal myocardium. Features are consistent with a pseudonormal left ventricular filling pattern, with concomitant abnormal relaxation and increased filling pressure ( grade 2 diastolic dysfunction). - Aortic valve: Mildly to moderately calcified annulus. Mildly thickened, mildly calcified leaflets. There was mild stenosis. There was trivial regurgitation. Valve area (VTI): 1.31 cm^2. Valve area (Vmax): 1.28 cm^2. Valve area (Vmean): 1.26 cm^2. - Mitral valve: There was mild regurgitation. - Left atrium: The atrium was moderately dilated. - Pericardium, extracardiac: A trivial pericardial effusion was identified. There was a left pleural effusion.   Patient Profile     77 y.o. female with a history of Chronic combined systolic and diastolic CHF who was admitted from the office with CHF.  Assessment & Plan    1. Acute on chronic systolic and diastolic CHF -Echo last year with LVEF 40-45% and grade 2 DD. Managed at home with GDMT with carvedilol, ACE-I, imdur, hydralazine, lasix 20 mg daily -Diuresed well with lasix 40 mg IV BID and switched to oral dosing for  this am.  -UOP 1.7 in last 24 hrs. Overall fluid balance is  net negative 11.9L. -Wt is down 15 lbs since admission, no change from yesterday to today. Current wt 152 lbs.  -SCr is stable at 1.86. K+ 5.2 today.  -Clinically may be ready for discharge, but pt feels that she still has some extra fluid. Says her usual wt is 140 lbs. Will defer decision to Dr. Tenny Craw.   2. Acute on chronic CKD stage 3 -Baseline SCr ~1.5 -Scr is stable at 1.86 (1.89 yest) with diuresis. K+ 5.2 today.   3. CAD s/p recent PCI with DES to proximal PDA and DES to mid RCA - stable, no chest pain - continue ASA and plavix  4. Hypertension -BP has been well controlled, mildly elevated this am. Continue current therapy  5. PAD -Severe deformity of right foot. R AKA planned at [facility].  6. DM type 2 -fasting blood sugar has been low. Adjustments made per diabetes coordinator recommendations. Lantus reduced. Amaryl on hold.  -Blood sugar up this am.  -Will have DM coordinator input on discharge meds.    For questions or updates, please contact [facility] Please consult [email or web address] for contact info under Cardiology/STEMI.      Signed, Berton Bon, NP  09/04/2017, 8:31 AM     Pt seen and examined  I agree with findings as noted by Cleotis Nipper above   Pt hs had significant diuresis  12 L    ON exam:   JVP normal   Lungs are CTA   Cardiac RRR   N oS3   Ext without siginfi edema    Overall I think she is OK for D/C  May have minimal volume increase Arrange f/u as outpt.    Dietrich Pates

## 2017-09-05 DIAGNOSIS — I5043 Acute on chronic combined systolic (congestive) and diastolic (congestive) heart failure: Secondary | ICD-10-CM | POA: Diagnosis not present

## 2017-09-05 DIAGNOSIS — Z792 Long term (current) use of antibiotics: Secondary | ICD-10-CM | POA: Diagnosis not present

## 2017-09-05 DIAGNOSIS — I2510 Atherosclerotic heart disease of native coronary artery without angina pectoris: Secondary | ICD-10-CM | POA: Diagnosis not present

## 2017-09-05 DIAGNOSIS — Z951 Presence of aortocoronary bypass graft: Secondary | ICD-10-CM | POA: Diagnosis not present

## 2017-09-05 DIAGNOSIS — I130 Hypertensive heart and chronic kidney disease with heart failure and stage 1 through stage 4 chronic kidney disease, or unspecified chronic kidney disease: Secondary | ICD-10-CM | POA: Diagnosis not present

## 2017-09-05 DIAGNOSIS — E1151 Type 2 diabetes mellitus with diabetic peripheral angiopathy without gangrene: Secondary | ICD-10-CM | POA: Diagnosis not present

## 2017-09-05 DIAGNOSIS — E1165 Type 2 diabetes mellitus with hyperglycemia: Secondary | ICD-10-CM | POA: Diagnosis not present

## 2017-09-05 DIAGNOSIS — R262 Difficulty in walking, not elsewhere classified: Secondary | ICD-10-CM | POA: Diagnosis not present

## 2017-09-05 DIAGNOSIS — D631 Anemia in chronic kidney disease: Secondary | ICD-10-CM | POA: Diagnosis not present

## 2017-09-05 DIAGNOSIS — I214 Non-ST elevation (NSTEMI) myocardial infarction: Secondary | ICD-10-CM | POA: Diagnosis not present

## 2017-09-05 DIAGNOSIS — Z7902 Long term (current) use of antithrombotics/antiplatelets: Secondary | ICD-10-CM | POA: Diagnosis not present

## 2017-09-05 DIAGNOSIS — L03116 Cellulitis of left lower limb: Secondary | ICD-10-CM | POA: Diagnosis not present

## 2017-09-05 DIAGNOSIS — Z9181 History of falling: Secondary | ICD-10-CM | POA: Diagnosis not present

## 2017-09-05 DIAGNOSIS — I872 Venous insufficiency (chronic) (peripheral): Secondary | ICD-10-CM | POA: Diagnosis not present

## 2017-09-05 DIAGNOSIS — E11610 Type 2 diabetes mellitus with diabetic neuropathic arthropathy: Secondary | ICD-10-CM | POA: Diagnosis not present

## 2017-09-05 DIAGNOSIS — N183 Chronic kidney disease, stage 3 (moderate): Secondary | ICD-10-CM | POA: Diagnosis not present

## 2017-09-05 DIAGNOSIS — E1122 Type 2 diabetes mellitus with diabetic chronic kidney disease: Secondary | ICD-10-CM | POA: Diagnosis not present

## 2017-09-05 DIAGNOSIS — Z794 Long term (current) use of insulin: Secondary | ICD-10-CM | POA: Diagnosis not present

## 2017-09-09 ENCOUNTER — Encounter: Payer: Self-pay | Admitting: Physician Assistant

## 2017-09-09 ENCOUNTER — Ambulatory Visit: Payer: Medicare Other | Admitting: Physician Assistant

## 2017-09-09 DIAGNOSIS — E1142 Type 2 diabetes mellitus with diabetic polyneuropathy: Secondary | ICD-10-CM | POA: Diagnosis not present

## 2017-09-09 NOTE — Progress Notes (Deleted)
Cardiology Office Note   Date:  09/09/2017   ID:  Asante, Ritacco [birth year], MRN [id number]  PCP:  Noni Saupe, MD  Cardiologist: Dr. Antoine Poche, 01/23/2017 Theodore Demark, PA-C   No chief complaint on file.   History of Present Illness: Emily Miranda is a 77 y.o. female with a history of  NSTEMI 09/2016 w/ med rx, 10/2016 DES RCA & PDA, DM, HTN, HLD, PUD, CKD III, Charcot foot, mild AS, nephrolithiasis, migraines, pancreatits, S-D-CHF with EF 40-45%, stasis dermatitis both legs with recurrent lower extremity ulcers and wounds.  Admitted 03/1/-08/05/2017 to [facility] for acute on chronic CHF, LE cellulitis, NSTEMI>>DES LAD & CFX, no ACE 2nd hyperkalemia, beta-blocker started, discharged on Lantus twice daily and Amaryl, Cr 1.9, alb 1.6 at d/c, anemic w/ H&H 8.1/26.2 Admitted 4/18-4/25/2019 to [facility] for acute on chronic combined CHF by [name], PA-C for volume overload.  Weight 152 pounds at discharge, creatinine 1.86 and potassium 5.2 discharge, DC on Lasix 40 mg daily.  No chest pain  [name] presents for ***   Past Medical History:  Diagnosis Date  . AKI (acute kidney injury) (HCC)   . Aortic stenosis, mild   . CAD (coronary artery disease)    a. 09/2016 NSTEMI/Cath: RCA 70p/m, RPDA 95ost-->Med Rx;  b. 10/2016 PCI: RCA 70p/m (2.25 x 15 Resolute Onyx DES), RPDA 95ost (2.0 x 12 Resolute Onyx DES).  . Charcot foot due to diabetes mellitus (HCC)   . Chronic combined systolic (congestive) and diastolic (congestive) heart failure (HCC)    a. 07/2016 Echo: EF 40-45%, Gr2 DD, mild AS, triv AI, mild MR, mod dil LA.  . Diabetic peripheral neuropathy (HCC)   . Heart murmur   . History of blood transfusion    "related to low HgB"  . History of kidney stones   . History of stomach ulcers   . HTN (hypertension)   . Hyperlipidemia   . Migraine    "maybe once/month" (08/28/2017)  . Myocardial infarction (HCC) 07/2017   "while hospitalized at [facility]"  . NSTEMI  (non-ST elevated myocardial infarction) (HCC) 10/06/2016  . Pancreatitis    "my dad died w/it at 51 yr old"  . Physical deconditioning    a. W/C bound.  . Pneumonia 10/2016  . Stasis dermatitis of both legs 05/2017  . Type II diabetes mellitus (HCC)     Past Surgical History:  Procedure Laterality Date  . ABDOMINAL HYSTERECTOMY    . APPENDECTOMY    . CARPAL TUNNEL RELEASE Bilateral   . CATARACT EXTRACTION W/ INTRAOCULAR LENS  IMPLANT, BILATERAL Bilateral   . CORONARY ANGIOPLASTY WITH STENT PLACEMENT  07/2017   "@ [facility]"  . CORONARY STENT INTERVENTION N/A 11/05/2016   Procedure: Coronary Stent Intervention;  Surgeon: Lennette Bihari, MD;  Location: [facility] INVASIVE CV LAB;  Service: Cardiovascular;  Laterality: N/A;  . DILATION AND CURETTAGE OF UTERUS    . EXCISIONAL HEMORRHOIDECTOMY    . FOOT SURGERY Right    "ulcer"  . GLAUCOMA SURGERY Bilateral   . LAPAROSCOPIC INCISIONAL / UMBILICAL / VENTRAL HERNIA REPAIR  01/04/2008   Dr Bertram Savin  . LAPAROSCOPIC LYSIS OF ADHESIONS  2007   Dr Donovan Kail  . LEFT HEART CATH AND CORONARY ANGIOGRAPHY N/A 10/11/2016   Procedure: Left Heart Cath and Coronary Angiography;  Surgeon: Yvonne Kendall, MD;  Location: [facility] INVASIVE CV LAB;  Service: Cardiovascular;  Laterality: N/A;  . OVARIAN CYST REMOVAL    . ROBOT ASSISTED  PYELOPLASTY  02/2007   Dr Laverle Patter  . SUTURE REMOVAL  08/17/2009   Dr Bertram Savin .  Right paramedian GoreTex stitch  . TONSILLECTOMY      Current Outpatient Medications  Medication Sig Dispense Refill  . Ascorbic Acid (VITAMIN C) 1000 MG tablet Take 1,000 mg by mouth daily.    Marland Kitchen aspirin EC 81 MG tablet Take 1 tablet (81 mg total) by mouth daily. 150 tablet 2  . atorvastatin (LIPITOR) 40 MG tablet Take 1 tablet (40 mg total) by mouth daily at 6 PM. 30 tablet 3  . carvedilol (COREG) 3.125 MG tablet Take 3 tablets (9.375 mg total) by mouth 2 (two) times daily with a meal. 180 tablet 11  . clopidogrel (PLAVIX) 75 MG tablet Take 1 tablet  (75 mg total) by mouth daily with breakfast. 30 tablet 6  . collagenase (SANTYL) ointment Apply topically daily. Apply to LE plantar surface daily. 15 g 0  . cyclobenzaprine (FLEXERIL) 5 MG tablet Take 10 mg by mouth 3 (three) times daily.   0  . diclofenac sodium (VOLTAREN) 1 % GEL Apply 2 g topically 4 (four) times daily. Please apply to let hip. 1 Tube 0  . fexofenadine (ALLEGRA) 180 MG tablet Take 180 mg by mouth daily.    . fluticasone (FLONASE) 50 MCG/ACT nasal spray Place 1 spray into both nostrils daily as needed (seasonal allergies).     . furosemide (LASIX) 40 MG tablet Take 1 tablet (40 mg total) by mouth daily. 30 tablet 5  . hydrALAZINE (APRESOLINE) 25 MG tablet Take 25 mg by mouth every 8 (eight) hours.    Marland Kitchen HYDROcodone-acetaminophen (NORCO/VICODIN) 5-325 MG tablet Take 1 tablet by mouth every 6 (six) hours as needed for moderate pain. (Patient not taking: Reported on 08/29/2017) 10 tablet 0  . insulin glargine (LANTUS) 100 UNIT/ML injection Inject 0.12 mLs (12 Units total) into the skin at bedtime. 10 mL 11  . isosorbide mononitrate (IMDUR) 30 MG 24 hr tablet Take 1 tablet (30 mg total) by mouth daily. 30 tablet 6  . linagliptin (TRADJENTA) 5 MG TABS tablet Take 5 mg by mouth daily.    Marland Kitchen lisinopril (PRINIVIL,ZESTRIL) 30 MG tablet Take 30 mg by mouth daily.    . mupirocin cream (BACTROBAN) 2 % Apply 1 application topically 2 (two) times daily. 15 g 0  . nitroGLYCERIN (NITROSTAT) 0.4 MG SL tablet Place 1 tablet (0.4 mg total) under the tongue every 5 (five) minutes x 3 doses as needed for chest pain. 25 tablet 3  . pantoprazole (PROTONIX) 40 MG tablet Take 1 tablet (40 mg total) by mouth daily. 90 tablet 1  . Polyethyl Glycol-Propyl Glycol (SYSTANE OP) Place 1 drop into both eyes daily as needed (dry eyes).    . polyethylene glycol (MIRALAX / GLYCOLAX) packet Take 17 g by mouth daily as needed (constipation). Mix in 8 oz liquid and drink    . silver sulfADIAZINE (SILVADENE) 1 % cream  Apply topically daily. Apply to right lower extremities affected areas, not to the plantar region 50 g 0   No current facility-administered medications for this visit.     Allergies:   Levofloxacin; Amlodipine; Gabapentin; Hydrochlorothiazide; Ibuprofen; Metformin and related; and Simvastatin    Social History:  The patient  reports that she has never smoked. She has never used smokeless tobacco. She reports that she does not drink alcohol or use drugs.   Family History:  The patient's family history includes Chronic Renal Failure in her mother;  Diabetes in her father; Heart attack (age of onset: 61) in her father.    ROS:  Please see the history of present illness. All other systems are reviewed and negative.    PHYSICAL EXAM: VS:  There were no vitals taken for this visit. , BMI There is no height or weight on file to calculate BMI. GEN: Well nourished, well developed, female in no acute distress  HEENT: normal for age  Neck: no JVD, no carotid bruit, no masses Cardiac: RRR; no murmur, no rubs, or gallops Respiratory:  clear to auscultation bilaterally, normal work of breathing GI: soft, nontender, nondistended, + BS MS: no deformity or atrophy; no edema; distal pulses are 2+ in all 4 extremities   Skin: warm and dry, no rash Neuro:  Strength and sensation are intact Psych: euthymic mood, full affect   EKG:  EKG {ACTION; IS/IS ZOX:[id number]} ordered today. The ekg ordered today demonstrates ***  CATH: 08/01/2017 at [facility] 1. 2-vessel CAD (Lcx and LAD) 2. PCI 80% lesion of mid and distal LAD-s/p DES  3. PCI 80% lesion of proximal and mid LCx-s/p DES  Recommendations:  1. Sheath out; TR band applied; to floor in stable condition 2. Aspirin 81mg  daily for life; clopidogrel 75mg  daily for at least 1 year   [facility] OTHER ORDERS    ECHO: 08/01/2017 at [facility] 2D DIMENSIONS  AORTAValues Normal RangeMAIN PAValues Normal Range    Annulus:nm*cm[1.9 - 2.7]PA Main:nm*cm[1.5 - 2.1]   Aorta Sin:nm*cm[2.4 - 3.6] RIGHT VENTRICLE  ST Junction:nm*cm[2 - 3.2]RV Base:nm*cm[2.5 - 4.1]   Asc.Aorta: 3.5 cm[1.9 - 3.5] RV Mid:nm*cm[1.9 - 3.5]  LEFT VENTRICLE RV Length:nm*cm[]    LVIDd: 4.9 cm[3.7 - 5.3] RIGHT ATRIUM   LVIDs: 3.6 cm[2.2 - 3.4]RA Area:22 cm2 [ <= 20]  LVEDVi:nm*ml/m2 [29 - 61] RAVi:nm*ml/m2 Vindex.Nunnery - 27]  LVESVi:nm*ml/m2 [8 - 24]INFERIOR VENA CAVA  FS:27 % [ >= 25] Max.IVC:nm*cm[ <= 2.1]   SWT: 1.1 cm[0.6 - 0.9]Min.IVC:nm*cm[ <= 1.7]   PWT: 1.1 cm[0.6 - 0.9] __________________  LEFT ATRIUM nm* - not measured   LA Diam: 4.6 cm[2.7 - 3.8]   LA Area:25 cm2 [ <= 20]   LA Volume:65 ml[22 - 52]  LAVi:37 ml/m2 [16 - 34]    ECHOCARDIOGRAPHIC DESCRIPTIONS -----------------------------------------------  AORTIC ROOT  Size: Normal  Dissection: INDETERM FOR DISSECTION    AORTIC VALVE  Leaflets: Tricuspid Morphology: MILDLY THICKENED  Mobility: PARTIALLY MOBILE    LEFT VENTRICLEAnterior: HYPOCONTRACTILE  Size: Normal Lateral: HYPOCONTRACTILE  Contraction: MILD GLOBAL DECREASESeptal: HYPOCONTRACTILE  Closest EF: 45%(Estimated)Apical: HYPOCONTRACTILE   LV masses: No Masses Inferior: HYPOCONTRACTILE   LVH: MILD LVH Posterior: HYPOCONTRACTILE  Dias.FxClass: RELAXATION ABNORMALITY (GRADE 2) CORRESPONDS TO PSEUDONORMAL    MITRAL VALVE  Leaflets:  NormalMobility: Fully mobile  Morphology: Normal    LEFT ATRIUM  Size: MILDLY ENLARGED   LA masses: No masses  Normal IAS    MAIN PA  Size: Not seen    PULMONIC VALVE  Morphology: Normal  Mobility: Fully Mobile    RIGHT VENTRICLE  Size: NormalFree wall: Normal  Contraction: NormalRV masses: No Masses   TAPSE: 1.7 cm,Normal Range [>= 1.6 cm]   RV Note: ANNULAR VELOCITY=10CM/SEC    TRICUSPID VALVE  Leaflets: NormalMobility: Fully mobile  Morphology: Normal    RIGHT ATRIUM  Size: MILDLY ENLARGEDRA Other: None   RA masses: No masses    PERICARDIUM   Fluid: No effusion    INFERIOR VENACAVA  Size: Normal Normal respiratory collapse    DOPPLER ECHO and OTHER  SPECIAL PROCEDURES ------------------------------------   Aortic: MILD ARMILD AS  3.2 m/s peak vel 44 mmHg peak grad24 mmHg mean grad 1.6 cm2 by DOPPLER  LVOT Diam: 2.0 cm. Resting LVOT Vel: 1.7 m/s. Dimensionless Index: 0.52     Mitral: MILD MRNo MS   MV Inflow E Vel.= 115.0 cm/sMV Annulus E'Vel.= 5.0 cm/sE/E'Ratio= 23    Tricuspid: MILD TRNo TS   2.8 m/s peak TR vel 34 mmHg peak RV pressure    Pulmonary: TRIVIAL PR No PS    Other:    INTERPRETATION ---------------------------------------------------------------  MILD LV DYSFUNCTION (See above) WITH MILD LVH  ELEVATED LA PRESSURES WITH DIASTOLIC DYSFUNCTION  NORMAL RIGHT VENTRICULAR SYSTOLIC FUNCTION  VALVULAR REGURGITATION: MILD AR, MILD MR, TRIVIAL PR, MILD TR  VALVULAR STENOSIS: MILD AS  MILD TO MODERATE MR  NO PRIOR STUDY FOR COMPARISON     Recent Labs: 10/06/2016: TSH 5.876 05/30/2017: Magnesium  1.9 08/28/2017: ALT 13; B Natriuretic Peptide 2,542.7; Hemoglobin 7.7; Platelets 231 09/04/2017: BUN 47; Creatinine, Ser 1.86; Potassium 5.2; Sodium 138    Lipid Panel    Component Value Date/Time   CHOL 125 10/07/2016 0233   TRIG 97 10/07/2016 0233   HDL 34 (L) 10/07/2016 0233   CHOLHDL 3.7 10/07/2016 0233   VLDL 19 10/07/2016 0233   LDLCALC 72 10/07/2016 0233     Wt Readings from Last 3 Encounters:  09/04/17 152 lb 11.2 oz (69.3 kg)  08/28/17 160 lb (72.6 kg)  06/03/17 142 lb 13.7 oz (64.8 kg)     Other studies Reviewed: Additional studies/ records that were reviewed today include: Office notes, hospital records and testing.  ASSESSMENT AND PLAN:  1.  ***   Current medicines are reviewed at length with the patient today.  The patient {ACTIONS; HAS/DOES NOT HAVE:19233} concerns regarding medicines.  The following changes have been made:  {PLAN; NO CHANGE:13088:s}  Labs/ tests ordered today include: *** No orders of the defined types were placed in this encounter.    Disposition:   FU with Dr. Antoine Poche  Signed, Theodore Demark, PA-C  09/09/2017 8:18 AM     [facility] Phone: [phone]; Fax: [phone]  This note was written with the assistance of speech recognition software. Please excuse any transcriptional errors.

## 2017-09-10 ENCOUNTER — Encounter: Payer: Self-pay | Admitting: *Deleted

## 2017-09-10 DIAGNOSIS — I872 Venous insufficiency (chronic) (peripheral): Secondary | ICD-10-CM | POA: Diagnosis not present

## 2017-09-10 DIAGNOSIS — Z9181 History of falling: Secondary | ICD-10-CM | POA: Diagnosis not present

## 2017-09-10 DIAGNOSIS — N183 Chronic kidney disease, stage 3 (moderate): Secondary | ICD-10-CM | POA: Diagnosis not present

## 2017-09-10 DIAGNOSIS — I2510 Atherosclerotic heart disease of native coronary artery without angina pectoris: Secondary | ICD-10-CM | POA: Diagnosis not present

## 2017-09-10 DIAGNOSIS — L03115 Cellulitis of right lower limb: Secondary | ICD-10-CM | POA: Diagnosis not present

## 2017-09-10 DIAGNOSIS — R262 Difficulty in walking, not elsewhere classified: Secondary | ICD-10-CM | POA: Diagnosis not present

## 2017-09-10 DIAGNOSIS — E1122 Type 2 diabetes mellitus with diabetic chronic kidney disease: Secondary | ICD-10-CM | POA: Diagnosis not present

## 2017-09-10 DIAGNOSIS — I214 Non-ST elevation (NSTEMI) myocardial infarction: Secondary | ICD-10-CM | POA: Diagnosis not present

## 2017-09-10 DIAGNOSIS — I130 Hypertensive heart and chronic kidney disease with heart failure and stage 1 through stage 4 chronic kidney disease, or unspecified chronic kidney disease: Secondary | ICD-10-CM | POA: Diagnosis not present

## 2017-09-10 DIAGNOSIS — M14671 Charcot's joint, right ankle and foot: Secondary | ICD-10-CM | POA: Diagnosis not present

## 2017-09-10 DIAGNOSIS — D631 Anemia in chronic kidney disease: Secondary | ICD-10-CM | POA: Diagnosis not present

## 2017-09-10 DIAGNOSIS — E1151 Type 2 diabetes mellitus with diabetic peripheral angiopathy without gangrene: Secondary | ICD-10-CM | POA: Diagnosis not present

## 2017-09-10 DIAGNOSIS — R260 Ataxic gait: Secondary | ICD-10-CM | POA: Diagnosis not present

## 2017-09-10 DIAGNOSIS — Z951 Presence of aortocoronary bypass graft: Secondary | ICD-10-CM | POA: Diagnosis not present

## 2017-09-10 DIAGNOSIS — E1165 Type 2 diabetes mellitus with hyperglycemia: Secondary | ICD-10-CM | POA: Diagnosis not present

## 2017-09-10 DIAGNOSIS — I5043 Acute on chronic combined systolic (congestive) and diastolic (congestive) heart failure: Secondary | ICD-10-CM | POA: Diagnosis not present

## 2017-09-10 DIAGNOSIS — Z794 Long term (current) use of insulin: Secondary | ICD-10-CM | POA: Diagnosis not present

## 2017-09-10 DIAGNOSIS — Z792 Long term (current) use of antibiotics: Secondary | ICD-10-CM | POA: Diagnosis not present

## 2017-09-10 DIAGNOSIS — E11610 Type 2 diabetes mellitus with diabetic neuropathic arthropathy: Secondary | ICD-10-CM | POA: Diagnosis not present

## 2017-09-10 DIAGNOSIS — Z7902 Long term (current) use of antithrombotics/antiplatelets: Secondary | ICD-10-CM | POA: Diagnosis not present

## 2017-09-10 DIAGNOSIS — L03116 Cellulitis of left lower limb: Secondary | ICD-10-CM | POA: Diagnosis not present

## 2017-09-15 DIAGNOSIS — E11621 Type 2 diabetes mellitus with foot ulcer: Secondary | ICD-10-CM | POA: Diagnosis not present

## 2017-09-15 DIAGNOSIS — Z7982 Long term (current) use of aspirin: Secondary | ICD-10-CM | POA: Diagnosis not present

## 2017-09-15 DIAGNOSIS — I2510 Atherosclerotic heart disease of native coronary artery without angina pectoris: Secondary | ICD-10-CM | POA: Diagnosis not present

## 2017-09-15 DIAGNOSIS — I5043 Acute on chronic combined systolic (congestive) and diastolic (congestive) heart failure: Secondary | ICD-10-CM | POA: Diagnosis not present

## 2017-09-15 DIAGNOSIS — L97318 Non-pressure chronic ulcer of right ankle with other specified severity: Secondary | ICD-10-CM | POA: Diagnosis not present

## 2017-09-15 DIAGNOSIS — E11649 Type 2 diabetes mellitus with hypoglycemia without coma: Secondary | ICD-10-CM | POA: Diagnosis not present

## 2017-09-15 DIAGNOSIS — E1122 Type 2 diabetes mellitus with diabetic chronic kidney disease: Secondary | ICD-10-CM | POA: Diagnosis not present

## 2017-09-15 DIAGNOSIS — Z7902 Long term (current) use of antithrombotics/antiplatelets: Secondary | ICD-10-CM | POA: Diagnosis not present

## 2017-09-15 DIAGNOSIS — L97415 Non-pressure chronic ulcer of right heel and midfoot with muscle involvement without evidence of necrosis: Secondary | ICD-10-CM | POA: Diagnosis not present

## 2017-09-15 DIAGNOSIS — I890 Lymphedema, not elsewhere classified: Secondary | ICD-10-CM | POA: Diagnosis not present

## 2017-09-15 DIAGNOSIS — I87331 Chronic venous hypertension (idiopathic) with ulcer and inflammation of right lower extremity: Secondary | ICD-10-CM | POA: Diagnosis not present

## 2017-09-15 DIAGNOSIS — N183 Chronic kidney disease, stage 3 (moderate): Secondary | ICD-10-CM | POA: Diagnosis not present

## 2017-09-16 DIAGNOSIS — E1129 Type 2 diabetes mellitus with other diabetic kidney complication: Secondary | ICD-10-CM | POA: Diagnosis not present

## 2017-09-16 DIAGNOSIS — E1165 Type 2 diabetes mellitus with hyperglycemia: Secondary | ICD-10-CM | POA: Diagnosis not present

## 2017-09-18 DIAGNOSIS — I214 Non-ST elevation (NSTEMI) myocardial infarction: Secondary | ICD-10-CM | POA: Diagnosis not present

## 2017-09-18 DIAGNOSIS — N183 Chronic kidney disease, stage 3 (moderate): Secondary | ICD-10-CM | POA: Diagnosis not present

## 2017-09-18 DIAGNOSIS — Z794 Long term (current) use of insulin: Secondary | ICD-10-CM | POA: Diagnosis not present

## 2017-09-18 DIAGNOSIS — E1151 Type 2 diabetes mellitus with diabetic peripheral angiopathy without gangrene: Secondary | ICD-10-CM | POA: Diagnosis not present

## 2017-09-18 DIAGNOSIS — E1165 Type 2 diabetes mellitus with hyperglycemia: Secondary | ICD-10-CM | POA: Diagnosis not present

## 2017-09-18 DIAGNOSIS — E11610 Type 2 diabetes mellitus with diabetic neuropathic arthropathy: Secondary | ICD-10-CM | POA: Diagnosis not present

## 2017-09-18 DIAGNOSIS — I2510 Atherosclerotic heart disease of native coronary artery without angina pectoris: Secondary | ICD-10-CM | POA: Diagnosis not present

## 2017-09-18 DIAGNOSIS — Z792 Long term (current) use of antibiotics: Secondary | ICD-10-CM | POA: Diagnosis not present

## 2017-09-18 DIAGNOSIS — R262 Difficulty in walking, not elsewhere classified: Secondary | ICD-10-CM | POA: Diagnosis not present

## 2017-09-18 DIAGNOSIS — I872 Venous insufficiency (chronic) (peripheral): Secondary | ICD-10-CM | POA: Diagnosis not present

## 2017-09-18 DIAGNOSIS — L03115 Cellulitis of right lower limb: Secondary | ICD-10-CM | POA: Diagnosis not present

## 2017-09-18 DIAGNOSIS — L03116 Cellulitis of left lower limb: Secondary | ICD-10-CM | POA: Diagnosis not present

## 2017-09-18 DIAGNOSIS — Z7902 Long term (current) use of antithrombotics/antiplatelets: Secondary | ICD-10-CM | POA: Diagnosis not present

## 2017-09-18 DIAGNOSIS — E1122 Type 2 diabetes mellitus with diabetic chronic kidney disease: Secondary | ICD-10-CM | POA: Diagnosis not present

## 2017-09-18 DIAGNOSIS — Z9181 History of falling: Secondary | ICD-10-CM | POA: Diagnosis not present

## 2017-09-18 DIAGNOSIS — Z951 Presence of aortocoronary bypass graft: Secondary | ICD-10-CM | POA: Diagnosis not present

## 2017-09-18 DIAGNOSIS — I130 Hypertensive heart and chronic kidney disease with heart failure and stage 1 through stage 4 chronic kidney disease, or unspecified chronic kidney disease: Secondary | ICD-10-CM | POA: Diagnosis not present

## 2017-09-18 DIAGNOSIS — I5043 Acute on chronic combined systolic (congestive) and diastolic (congestive) heart failure: Secondary | ICD-10-CM | POA: Diagnosis not present

## 2017-09-18 DIAGNOSIS — R260 Ataxic gait: Secondary | ICD-10-CM | POA: Diagnosis not present

## 2017-09-18 DIAGNOSIS — D631 Anemia in chronic kidney disease: Secondary | ICD-10-CM | POA: Diagnosis not present

## 2017-09-18 DIAGNOSIS — M14671 Charcot's joint, right ankle and foot: Secondary | ICD-10-CM | POA: Diagnosis not present

## 2017-09-22 DIAGNOSIS — L03116 Cellulitis of left lower limb: Secondary | ICD-10-CM | POA: Diagnosis not present

## 2017-09-22 DIAGNOSIS — E1165 Type 2 diabetes mellitus with hyperglycemia: Secondary | ICD-10-CM | POA: Diagnosis not present

## 2017-09-22 DIAGNOSIS — L03115 Cellulitis of right lower limb: Secondary | ICD-10-CM | POA: Diagnosis not present

## 2017-09-22 DIAGNOSIS — Z794 Long term (current) use of insulin: Secondary | ICD-10-CM | POA: Diagnosis not present

## 2017-09-22 DIAGNOSIS — I5043 Acute on chronic combined systolic (congestive) and diastolic (congestive) heart failure: Secondary | ICD-10-CM | POA: Diagnosis not present

## 2017-09-22 DIAGNOSIS — D631 Anemia in chronic kidney disease: Secondary | ICD-10-CM | POA: Diagnosis not present

## 2017-09-22 DIAGNOSIS — I214 Non-ST elevation (NSTEMI) myocardial infarction: Secondary | ICD-10-CM | POA: Diagnosis not present

## 2017-09-22 DIAGNOSIS — I130 Hypertensive heart and chronic kidney disease with heart failure and stage 1 through stage 4 chronic kidney disease, or unspecified chronic kidney disease: Secondary | ICD-10-CM | POA: Diagnosis not present

## 2017-09-22 DIAGNOSIS — Z951 Presence of aortocoronary bypass graft: Secondary | ICD-10-CM | POA: Diagnosis not present

## 2017-09-22 DIAGNOSIS — M14671 Charcot's joint, right ankle and foot: Secondary | ICD-10-CM | POA: Diagnosis not present

## 2017-09-22 DIAGNOSIS — E1122 Type 2 diabetes mellitus with diabetic chronic kidney disease: Secondary | ICD-10-CM | POA: Diagnosis not present

## 2017-09-22 DIAGNOSIS — R262 Difficulty in walking, not elsewhere classified: Secondary | ICD-10-CM | POA: Diagnosis not present

## 2017-09-22 DIAGNOSIS — N183 Chronic kidney disease, stage 3 (moderate): Secondary | ICD-10-CM | POA: Diagnosis not present

## 2017-09-22 DIAGNOSIS — I872 Venous insufficiency (chronic) (peripheral): Secondary | ICD-10-CM | POA: Diagnosis not present

## 2017-09-22 DIAGNOSIS — Z792 Long term (current) use of antibiotics: Secondary | ICD-10-CM | POA: Diagnosis not present

## 2017-09-22 DIAGNOSIS — Z7902 Long term (current) use of antithrombotics/antiplatelets: Secondary | ICD-10-CM | POA: Diagnosis not present

## 2017-09-22 DIAGNOSIS — I2510 Atherosclerotic heart disease of native coronary artery without angina pectoris: Secondary | ICD-10-CM | POA: Diagnosis not present

## 2017-09-22 DIAGNOSIS — Z9181 History of falling: Secondary | ICD-10-CM | POA: Diagnosis not present

## 2017-09-22 DIAGNOSIS — R260 Ataxic gait: Secondary | ICD-10-CM | POA: Diagnosis not present

## 2017-09-22 DIAGNOSIS — E11610 Type 2 diabetes mellitus with diabetic neuropathic arthropathy: Secondary | ICD-10-CM | POA: Diagnosis not present

## 2017-09-22 DIAGNOSIS — E1151 Type 2 diabetes mellitus with diabetic peripheral angiopathy without gangrene: Secondary | ICD-10-CM | POA: Diagnosis not present

## 2017-09-23 DIAGNOSIS — L97415 Non-pressure chronic ulcer of right heel and midfoot with muscle involvement without evidence of necrosis: Secondary | ICD-10-CM | POA: Diagnosis not present

## 2017-09-23 DIAGNOSIS — E11621 Type 2 diabetes mellitus with foot ulcer: Secondary | ICD-10-CM | POA: Diagnosis not present

## 2017-09-24 DIAGNOSIS — Z951 Presence of aortocoronary bypass graft: Secondary | ICD-10-CM | POA: Diagnosis not present

## 2017-09-24 DIAGNOSIS — N183 Chronic kidney disease, stage 3 (moderate): Secondary | ICD-10-CM | POA: Diagnosis not present

## 2017-09-24 DIAGNOSIS — I130 Hypertensive heart and chronic kidney disease with heart failure and stage 1 through stage 4 chronic kidney disease, or unspecified chronic kidney disease: Secondary | ICD-10-CM | POA: Diagnosis not present

## 2017-09-24 DIAGNOSIS — E11610 Type 2 diabetes mellitus with diabetic neuropathic arthropathy: Secondary | ICD-10-CM | POA: Diagnosis not present

## 2017-09-24 DIAGNOSIS — E1151 Type 2 diabetes mellitus with diabetic peripheral angiopathy without gangrene: Secondary | ICD-10-CM | POA: Diagnosis not present

## 2017-09-24 DIAGNOSIS — Z792 Long term (current) use of antibiotics: Secondary | ICD-10-CM | POA: Diagnosis not present

## 2017-09-24 DIAGNOSIS — I2510 Atherosclerotic heart disease of native coronary artery without angina pectoris: Secondary | ICD-10-CM | POA: Diagnosis not present

## 2017-09-24 DIAGNOSIS — E1122 Type 2 diabetes mellitus with diabetic chronic kidney disease: Secondary | ICD-10-CM | POA: Diagnosis not present

## 2017-09-24 DIAGNOSIS — R262 Difficulty in walking, not elsewhere classified: Secondary | ICD-10-CM | POA: Diagnosis not present

## 2017-09-24 DIAGNOSIS — Z9181 History of falling: Secondary | ICD-10-CM | POA: Diagnosis not present

## 2017-09-24 DIAGNOSIS — E1165 Type 2 diabetes mellitus with hyperglycemia: Secondary | ICD-10-CM | POA: Diagnosis not present

## 2017-09-24 DIAGNOSIS — D631 Anemia in chronic kidney disease: Secondary | ICD-10-CM | POA: Diagnosis not present

## 2017-09-24 DIAGNOSIS — I214 Non-ST elevation (NSTEMI) myocardial infarction: Secondary | ICD-10-CM | POA: Diagnosis not present

## 2017-09-24 DIAGNOSIS — R260 Ataxic gait: Secondary | ICD-10-CM | POA: Diagnosis not present

## 2017-09-24 DIAGNOSIS — I872 Venous insufficiency (chronic) (peripheral): Secondary | ICD-10-CM | POA: Diagnosis not present

## 2017-09-24 DIAGNOSIS — Z7902 Long term (current) use of antithrombotics/antiplatelets: Secondary | ICD-10-CM | POA: Diagnosis not present

## 2017-09-24 DIAGNOSIS — L03115 Cellulitis of right lower limb: Secondary | ICD-10-CM | POA: Diagnosis not present

## 2017-09-24 DIAGNOSIS — M14671 Charcot's joint, right ankle and foot: Secondary | ICD-10-CM | POA: Diagnosis not present

## 2017-09-24 DIAGNOSIS — I5043 Acute on chronic combined systolic (congestive) and diastolic (congestive) heart failure: Secondary | ICD-10-CM | POA: Diagnosis not present

## 2017-09-24 DIAGNOSIS — L03116 Cellulitis of left lower limb: Secondary | ICD-10-CM | POA: Diagnosis not present

## 2017-09-24 DIAGNOSIS — Z794 Long term (current) use of insulin: Secondary | ICD-10-CM | POA: Diagnosis not present

## 2017-09-25 DIAGNOSIS — E1165 Type 2 diabetes mellitus with hyperglycemia: Secondary | ICD-10-CM | POA: Diagnosis not present

## 2017-09-25 DIAGNOSIS — D631 Anemia in chronic kidney disease: Secondary | ICD-10-CM | POA: Diagnosis not present

## 2017-09-25 DIAGNOSIS — R260 Ataxic gait: Secondary | ICD-10-CM | POA: Diagnosis not present

## 2017-09-25 DIAGNOSIS — L03116 Cellulitis of left lower limb: Secondary | ICD-10-CM | POA: Diagnosis not present

## 2017-09-25 DIAGNOSIS — M14671 Charcot's joint, right ankle and foot: Secondary | ICD-10-CM | POA: Diagnosis not present

## 2017-09-25 DIAGNOSIS — E1122 Type 2 diabetes mellitus with diabetic chronic kidney disease: Secondary | ICD-10-CM | POA: Diagnosis not present

## 2017-09-25 DIAGNOSIS — Z951 Presence of aortocoronary bypass graft: Secondary | ICD-10-CM | POA: Diagnosis not present

## 2017-09-25 DIAGNOSIS — I2510 Atherosclerotic heart disease of native coronary artery without angina pectoris: Secondary | ICD-10-CM | POA: Diagnosis not present

## 2017-09-25 DIAGNOSIS — I130 Hypertensive heart and chronic kidney disease with heart failure and stage 1 through stage 4 chronic kidney disease, or unspecified chronic kidney disease: Secondary | ICD-10-CM | POA: Diagnosis not present

## 2017-09-25 DIAGNOSIS — Z7902 Long term (current) use of antithrombotics/antiplatelets: Secondary | ICD-10-CM | POA: Diagnosis not present

## 2017-09-25 DIAGNOSIS — I872 Venous insufficiency (chronic) (peripheral): Secondary | ICD-10-CM | POA: Diagnosis not present

## 2017-09-25 DIAGNOSIS — E11610 Type 2 diabetes mellitus with diabetic neuropathic arthropathy: Secondary | ICD-10-CM | POA: Diagnosis not present

## 2017-09-25 DIAGNOSIS — I214 Non-ST elevation (NSTEMI) myocardial infarction: Secondary | ICD-10-CM | POA: Diagnosis not present

## 2017-09-25 DIAGNOSIS — N183 Chronic kidney disease, stage 3 (moderate): Secondary | ICD-10-CM | POA: Diagnosis not present

## 2017-09-25 DIAGNOSIS — Z9181 History of falling: Secondary | ICD-10-CM | POA: Diagnosis not present

## 2017-09-25 DIAGNOSIS — E1151 Type 2 diabetes mellitus with diabetic peripheral angiopathy without gangrene: Secondary | ICD-10-CM | POA: Diagnosis not present

## 2017-09-25 DIAGNOSIS — I5043 Acute on chronic combined systolic (congestive) and diastolic (congestive) heart failure: Secondary | ICD-10-CM | POA: Diagnosis not present

## 2017-09-25 DIAGNOSIS — R262 Difficulty in walking, not elsewhere classified: Secondary | ICD-10-CM | POA: Diagnosis not present

## 2017-09-25 DIAGNOSIS — Z794 Long term (current) use of insulin: Secondary | ICD-10-CM | POA: Diagnosis not present

## 2017-09-25 DIAGNOSIS — Z792 Long term (current) use of antibiotics: Secondary | ICD-10-CM | POA: Diagnosis not present

## 2017-09-25 DIAGNOSIS — L03115 Cellulitis of right lower limb: Secondary | ICD-10-CM | POA: Diagnosis not present

## 2017-09-29 DIAGNOSIS — R262 Difficulty in walking, not elsewhere classified: Secondary | ICD-10-CM | POA: Diagnosis not present

## 2017-09-29 DIAGNOSIS — L03115 Cellulitis of right lower limb: Secondary | ICD-10-CM | POA: Diagnosis not present

## 2017-09-29 DIAGNOSIS — N183 Chronic kidney disease, stage 3 (moderate): Secondary | ICD-10-CM | POA: Diagnosis not present

## 2017-09-29 DIAGNOSIS — R260 Ataxic gait: Secondary | ICD-10-CM | POA: Diagnosis not present

## 2017-09-29 DIAGNOSIS — E1165 Type 2 diabetes mellitus with hyperglycemia: Secondary | ICD-10-CM | POA: Diagnosis not present

## 2017-09-29 DIAGNOSIS — M14671 Charcot's joint, right ankle and foot: Secondary | ICD-10-CM | POA: Diagnosis not present

## 2017-09-29 DIAGNOSIS — Z951 Presence of aortocoronary bypass graft: Secondary | ICD-10-CM | POA: Diagnosis not present

## 2017-09-29 DIAGNOSIS — E1122 Type 2 diabetes mellitus with diabetic chronic kidney disease: Secondary | ICD-10-CM | POA: Diagnosis not present

## 2017-09-29 DIAGNOSIS — I5043 Acute on chronic combined systolic (congestive) and diastolic (congestive) heart failure: Secondary | ICD-10-CM | POA: Diagnosis not present

## 2017-09-29 DIAGNOSIS — Z792 Long term (current) use of antibiotics: Secondary | ICD-10-CM | POA: Diagnosis not present

## 2017-09-29 DIAGNOSIS — Z7902 Long term (current) use of antithrombotics/antiplatelets: Secondary | ICD-10-CM | POA: Diagnosis not present

## 2017-09-29 DIAGNOSIS — Z9181 History of falling: Secondary | ICD-10-CM | POA: Diagnosis not present

## 2017-09-29 DIAGNOSIS — Z794 Long term (current) use of insulin: Secondary | ICD-10-CM | POA: Diagnosis not present

## 2017-09-29 DIAGNOSIS — E11610 Type 2 diabetes mellitus with diabetic neuropathic arthropathy: Secondary | ICD-10-CM | POA: Diagnosis not present

## 2017-09-29 DIAGNOSIS — L03116 Cellulitis of left lower limb: Secondary | ICD-10-CM | POA: Diagnosis not present

## 2017-09-29 DIAGNOSIS — I214 Non-ST elevation (NSTEMI) myocardial infarction: Secondary | ICD-10-CM | POA: Diagnosis not present

## 2017-09-29 DIAGNOSIS — E1151 Type 2 diabetes mellitus with diabetic peripheral angiopathy without gangrene: Secondary | ICD-10-CM | POA: Diagnosis not present

## 2017-09-29 DIAGNOSIS — I2510 Atherosclerotic heart disease of native coronary artery without angina pectoris: Secondary | ICD-10-CM | POA: Diagnosis not present

## 2017-09-29 DIAGNOSIS — I130 Hypertensive heart and chronic kidney disease with heart failure and stage 1 through stage 4 chronic kidney disease, or unspecified chronic kidney disease: Secondary | ICD-10-CM | POA: Diagnosis not present

## 2017-09-29 DIAGNOSIS — I872 Venous insufficiency (chronic) (peripheral): Secondary | ICD-10-CM | POA: Diagnosis not present

## 2017-09-29 DIAGNOSIS — D631 Anemia in chronic kidney disease: Secondary | ICD-10-CM | POA: Diagnosis not present

## 2017-10-01 DIAGNOSIS — Z7902 Long term (current) use of antithrombotics/antiplatelets: Secondary | ICD-10-CM | POA: Diagnosis not present

## 2017-10-01 DIAGNOSIS — Z792 Long term (current) use of antibiotics: Secondary | ICD-10-CM | POA: Diagnosis not present

## 2017-10-01 DIAGNOSIS — I130 Hypertensive heart and chronic kidney disease with heart failure and stage 1 through stage 4 chronic kidney disease, or unspecified chronic kidney disease: Secondary | ICD-10-CM | POA: Diagnosis not present

## 2017-10-01 DIAGNOSIS — E1165 Type 2 diabetes mellitus with hyperglycemia: Secondary | ICD-10-CM | POA: Diagnosis not present

## 2017-10-01 DIAGNOSIS — L03115 Cellulitis of right lower limb: Secondary | ICD-10-CM | POA: Diagnosis not present

## 2017-10-01 DIAGNOSIS — I5043 Acute on chronic combined systolic (congestive) and diastolic (congestive) heart failure: Secondary | ICD-10-CM | POA: Diagnosis not present

## 2017-10-01 DIAGNOSIS — I214 Non-ST elevation (NSTEMI) myocardial infarction: Secondary | ICD-10-CM | POA: Diagnosis not present

## 2017-10-01 DIAGNOSIS — E11610 Type 2 diabetes mellitus with diabetic neuropathic arthropathy: Secondary | ICD-10-CM | POA: Diagnosis not present

## 2017-10-01 DIAGNOSIS — Z9181 History of falling: Secondary | ICD-10-CM | POA: Diagnosis not present

## 2017-10-01 DIAGNOSIS — I872 Venous insufficiency (chronic) (peripheral): Secondary | ICD-10-CM | POA: Diagnosis not present

## 2017-10-01 DIAGNOSIS — R262 Difficulty in walking, not elsewhere classified: Secondary | ICD-10-CM | POA: Diagnosis not present

## 2017-10-01 DIAGNOSIS — N183 Chronic kidney disease, stage 3 (moderate): Secondary | ICD-10-CM | POA: Diagnosis not present

## 2017-10-01 DIAGNOSIS — R260 Ataxic gait: Secondary | ICD-10-CM | POA: Diagnosis not present

## 2017-10-01 DIAGNOSIS — D631 Anemia in chronic kidney disease: Secondary | ICD-10-CM | POA: Diagnosis not present

## 2017-10-01 DIAGNOSIS — L03116 Cellulitis of left lower limb: Secondary | ICD-10-CM | POA: Diagnosis not present

## 2017-10-01 DIAGNOSIS — I2510 Atherosclerotic heart disease of native coronary artery without angina pectoris: Secondary | ICD-10-CM | POA: Diagnosis not present

## 2017-10-01 DIAGNOSIS — E1151 Type 2 diabetes mellitus with diabetic peripheral angiopathy without gangrene: Secondary | ICD-10-CM | POA: Diagnosis not present

## 2017-10-01 DIAGNOSIS — E1122 Type 2 diabetes mellitus with diabetic chronic kidney disease: Secondary | ICD-10-CM | POA: Diagnosis not present

## 2017-10-01 DIAGNOSIS — M14671 Charcot's joint, right ankle and foot: Secondary | ICD-10-CM | POA: Diagnosis not present

## 2017-10-01 DIAGNOSIS — Z794 Long term (current) use of insulin: Secondary | ICD-10-CM | POA: Diagnosis not present

## 2017-10-01 DIAGNOSIS — Z951 Presence of aortocoronary bypass graft: Secondary | ICD-10-CM | POA: Diagnosis not present

## 2017-10-02 DIAGNOSIS — I214 Non-ST elevation (NSTEMI) myocardial infarction: Secondary | ICD-10-CM | POA: Diagnosis not present

## 2017-10-02 DIAGNOSIS — E1122 Type 2 diabetes mellitus with diabetic chronic kidney disease: Secondary | ICD-10-CM | POA: Diagnosis not present

## 2017-10-02 DIAGNOSIS — D631 Anemia in chronic kidney disease: Secondary | ICD-10-CM | POA: Diagnosis not present

## 2017-10-02 DIAGNOSIS — I130 Hypertensive heart and chronic kidney disease with heart failure and stage 1 through stage 4 chronic kidney disease, or unspecified chronic kidney disease: Secondary | ICD-10-CM | POA: Diagnosis not present

## 2017-10-02 DIAGNOSIS — I2510 Atherosclerotic heart disease of native coronary artery without angina pectoris: Secondary | ICD-10-CM | POA: Diagnosis not present

## 2017-10-02 DIAGNOSIS — Z7902 Long term (current) use of antithrombotics/antiplatelets: Secondary | ICD-10-CM | POA: Diagnosis not present

## 2017-10-02 DIAGNOSIS — I872 Venous insufficiency (chronic) (peripheral): Secondary | ICD-10-CM | POA: Diagnosis not present

## 2017-10-02 DIAGNOSIS — R260 Ataxic gait: Secondary | ICD-10-CM | POA: Diagnosis not present

## 2017-10-02 DIAGNOSIS — M14671 Charcot's joint, right ankle and foot: Secondary | ICD-10-CM | POA: Diagnosis not present

## 2017-10-02 DIAGNOSIS — E11610 Type 2 diabetes mellitus with diabetic neuropathic arthropathy: Secondary | ICD-10-CM | POA: Diagnosis not present

## 2017-10-02 DIAGNOSIS — Z794 Long term (current) use of insulin: Secondary | ICD-10-CM | POA: Diagnosis not present

## 2017-10-02 DIAGNOSIS — E1165 Type 2 diabetes mellitus with hyperglycemia: Secondary | ICD-10-CM | POA: Diagnosis not present

## 2017-10-02 DIAGNOSIS — L03115 Cellulitis of right lower limb: Secondary | ICD-10-CM | POA: Diagnosis not present

## 2017-10-02 DIAGNOSIS — Z951 Presence of aortocoronary bypass graft: Secondary | ICD-10-CM | POA: Diagnosis not present

## 2017-10-02 DIAGNOSIS — I5043 Acute on chronic combined systolic (congestive) and diastolic (congestive) heart failure: Secondary | ICD-10-CM | POA: Diagnosis not present

## 2017-10-02 DIAGNOSIS — Z9181 History of falling: Secondary | ICD-10-CM | POA: Diagnosis not present

## 2017-10-02 DIAGNOSIS — Z792 Long term (current) use of antibiotics: Secondary | ICD-10-CM | POA: Diagnosis not present

## 2017-10-02 DIAGNOSIS — L03116 Cellulitis of left lower limb: Secondary | ICD-10-CM | POA: Diagnosis not present

## 2017-10-02 DIAGNOSIS — R262 Difficulty in walking, not elsewhere classified: Secondary | ICD-10-CM | POA: Diagnosis not present

## 2017-10-02 DIAGNOSIS — E1151 Type 2 diabetes mellitus with diabetic peripheral angiopathy without gangrene: Secondary | ICD-10-CM | POA: Diagnosis not present

## 2017-10-02 DIAGNOSIS — N183 Chronic kidney disease, stage 3 (moderate): Secondary | ICD-10-CM | POA: Diagnosis not present

## 2017-10-03 DIAGNOSIS — R0602 Shortness of breath: Secondary | ICD-10-CM | POA: Diagnosis not present

## 2017-10-03 DIAGNOSIS — R0902 Hypoxemia: Secondary | ICD-10-CM | POA: Diagnosis not present

## 2017-10-04 ENCOUNTER — Emergency Department (HOSPITAL_COMMUNITY): Payer: Medicare Other

## 2017-10-04 ENCOUNTER — Other Ambulatory Visit: Payer: Self-pay

## 2017-10-04 ENCOUNTER — Encounter (HOSPITAL_COMMUNITY): Payer: Self-pay | Admitting: Emergency Medicine

## 2017-10-04 ENCOUNTER — Inpatient Hospital Stay (HOSPITAL_COMMUNITY)
Admission: EM | Admit: 2017-10-04 | Discharge: 2017-10-18 | DRG: 286 | Disposition: A | Discharge status: Home Health | Payer: Medicare Other | Attending: Family Medicine | Admitting: Family Medicine

## 2017-10-04 DIAGNOSIS — N184 Chronic kidney disease, stage 4 (severe): Secondary | ICD-10-CM | POA: Diagnosis present

## 2017-10-04 DIAGNOSIS — N183 Chronic kidney disease, stage 3 unspecified: Secondary | ICD-10-CM | POA: Diagnosis present

## 2017-10-04 DIAGNOSIS — I130 Hypertensive heart and chronic kidney disease with heart failure and stage 1 through stage 4 chronic kidney disease, or unspecified chronic kidney disease: Secondary | ICD-10-CM | POA: Diagnosis present

## 2017-10-04 DIAGNOSIS — R0602 Shortness of breath: Secondary | ICD-10-CM

## 2017-10-04 DIAGNOSIS — N179 Acute kidney failure, unspecified: Secondary | ICD-10-CM | POA: Diagnosis not present

## 2017-10-04 DIAGNOSIS — Z66 Do not resuscitate: Secondary | ICD-10-CM | POA: Diagnosis not present

## 2017-10-04 DIAGNOSIS — E11621 Type 2 diabetes mellitus with foot ulcer: Secondary | ICD-10-CM | POA: Diagnosis present

## 2017-10-04 DIAGNOSIS — I420 Dilated cardiomyopathy: Secondary | ICD-10-CM | POA: Diagnosis present

## 2017-10-04 DIAGNOSIS — Z794 Long term (current) use of insulin: Secondary | ICD-10-CM

## 2017-10-04 DIAGNOSIS — I350 Nonrheumatic aortic (valve) stenosis: Secondary | ICD-10-CM | POA: Diagnosis present

## 2017-10-04 DIAGNOSIS — Z515 Encounter for palliative care: Secondary | ICD-10-CM

## 2017-10-04 DIAGNOSIS — I272 Pulmonary hypertension, unspecified: Secondary | ICD-10-CM | POA: Diagnosis not present

## 2017-10-04 DIAGNOSIS — Z9861 Coronary angioplasty status: Secondary | ICD-10-CM

## 2017-10-04 DIAGNOSIS — Z8249 Family history of ischemic heart disease and other diseases of the circulatory system: Secondary | ICD-10-CM

## 2017-10-04 DIAGNOSIS — J9601 Acute respiratory failure with hypoxia: Secondary | ICD-10-CM | POA: Diagnosis present

## 2017-10-04 DIAGNOSIS — R0902 Hypoxemia: Secondary | ICD-10-CM

## 2017-10-04 DIAGNOSIS — D631 Anemia in chronic kidney disease: Secondary | ICD-10-CM | POA: Diagnosis present

## 2017-10-04 DIAGNOSIS — M25512 Pain in left shoulder: Secondary | ICD-10-CM | POA: Diagnosis present

## 2017-10-04 DIAGNOSIS — Z7902 Long term (current) use of antithrombotics/antiplatelets: Secondary | ICD-10-CM

## 2017-10-04 DIAGNOSIS — I25110 Atherosclerotic heart disease of native coronary artery with unstable angina pectoris: Secondary | ICD-10-CM | POA: Diagnosis present

## 2017-10-04 DIAGNOSIS — I252 Old myocardial infarction: Secondary | ICD-10-CM | POA: Diagnosis not present

## 2017-10-04 DIAGNOSIS — R339 Retention of urine, unspecified: Secondary | ICD-10-CM | POA: Diagnosis present

## 2017-10-04 DIAGNOSIS — E1151 Type 2 diabetes mellitus with diabetic peripheral angiopathy without gangrene: Secondary | ICD-10-CM | POA: Diagnosis present

## 2017-10-04 DIAGNOSIS — E11610 Type 2 diabetes mellitus with diabetic neuropathic arthropathy: Secondary | ICD-10-CM | POA: Diagnosis present

## 2017-10-04 DIAGNOSIS — Z7189 Other specified counseling: Secondary | ICD-10-CM | POA: Diagnosis not present

## 2017-10-04 DIAGNOSIS — Z955 Presence of coronary angioplasty implant and graft: Secondary | ICD-10-CM

## 2017-10-04 DIAGNOSIS — L97519 Non-pressure chronic ulcer of other part of right foot with unspecified severity: Secondary | ICD-10-CM | POA: Diagnosis present

## 2017-10-04 DIAGNOSIS — E11649 Type 2 diabetes mellitus with hypoglycemia without coma: Secondary | ICD-10-CM | POA: Diagnosis present

## 2017-10-04 DIAGNOSIS — R05 Cough: Secondary | ICD-10-CM

## 2017-10-04 DIAGNOSIS — E039 Hypothyroidism, unspecified: Secondary | ICD-10-CM | POA: Diagnosis present

## 2017-10-04 DIAGNOSIS — I2729 Other secondary pulmonary hypertension: Secondary | ICD-10-CM | POA: Diagnosis present

## 2017-10-04 DIAGNOSIS — R059 Cough, unspecified: Secondary | ICD-10-CM

## 2017-10-04 DIAGNOSIS — I43 Cardiomyopathy in diseases classified elsewhere: Secondary | ICD-10-CM | POA: Diagnosis present

## 2017-10-04 DIAGNOSIS — I110 Hypertensive heart disease with heart failure: Secondary | ICD-10-CM | POA: Diagnosis present

## 2017-10-04 DIAGNOSIS — I5043 Acute on chronic combined systolic (congestive) and diastolic (congestive) heart failure: Secondary | ICD-10-CM | POA: Diagnosis present

## 2017-10-04 DIAGNOSIS — I509 Heart failure, unspecified: Secondary | ICD-10-CM | POA: Diagnosis not present

## 2017-10-04 DIAGNOSIS — L8990 Pressure ulcer of unspecified site, unspecified stage: Secondary | ICD-10-CM

## 2017-10-04 DIAGNOSIS — E1142 Type 2 diabetes mellitus with diabetic polyneuropathy: Secondary | ICD-10-CM | POA: Diagnosis present

## 2017-10-04 DIAGNOSIS — B961 Klebsiella pneumoniae [K. pneumoniae] as the cause of diseases classified elsewhere: Secondary | ICD-10-CM | POA: Diagnosis not present

## 2017-10-04 DIAGNOSIS — N390 Urinary tract infection, site not specified: Secondary | ICD-10-CM | POA: Diagnosis not present

## 2017-10-04 DIAGNOSIS — Z833 Family history of diabetes mellitus: Secondary | ICD-10-CM

## 2017-10-04 DIAGNOSIS — E1122 Type 2 diabetes mellitus with diabetic chronic kidney disease: Secondary | ICD-10-CM | POA: Diagnosis present

## 2017-10-04 DIAGNOSIS — Z841 Family history of disorders of kidney and ureter: Secondary | ICD-10-CM

## 2017-10-04 DIAGNOSIS — E785 Hyperlipidemia, unspecified: Secondary | ICD-10-CM | POA: Diagnosis present

## 2017-10-04 DIAGNOSIS — I2510 Atherosclerotic heart disease of native coronary artery without angina pectoris: Secondary | ICD-10-CM

## 2017-10-04 DIAGNOSIS — J810 Acute pulmonary edema: Secondary | ICD-10-CM | POA: Diagnosis present

## 2017-10-04 DIAGNOSIS — I200 Unstable angina: Secondary | ICD-10-CM | POA: Diagnosis present

## 2017-10-04 DIAGNOSIS — Z7982 Long term (current) use of aspirin: Secondary | ICD-10-CM

## 2017-10-04 DIAGNOSIS — I739 Peripheral vascular disease, unspecified: Secondary | ICD-10-CM | POA: Diagnosis not present

## 2017-10-04 DIAGNOSIS — I5041 Acute combined systolic (congestive) and diastolic (congestive) heart failure: Secondary | ICD-10-CM | POA: Diagnosis present

## 2017-10-04 DIAGNOSIS — J90 Pleural effusion, not elsewhere classified: Secondary | ICD-10-CM | POA: Diagnosis not present

## 2017-10-04 LAB — CBC WITH DIFFERENTIAL/PLATELET
Abs Immature Granulocytes: 0 10*3/uL (ref 0.0–0.1)
Abs Immature Granulocytes: 0 10*3/uL (ref 0.0–0.1)
Basophils Absolute: 0 10*3/uL (ref 0.0–0.1)
Basophils Absolute: 0.1 10*3/uL (ref 0.0–0.1)
Basophils Relative: 0 %
Basophils Relative: 1 %
Eosinophils Absolute: 0.4 10*3/uL (ref 0.0–0.7)
Eosinophils Absolute: 0.4 10*3/uL (ref 0.0–0.7)
Eosinophils Relative: 5 %
Eosinophils Relative: 5 %
HCT: 30.1 % — ABNORMAL LOW (ref 36.0–46.0)
HCT: 27.8 % — ABNORMAL LOW (ref 36.0–46.0)
Hemoglobin: 8.7 g/dL — ABNORMAL LOW (ref 12.0–15.0)
Hemoglobin: 8.2 g/dL — ABNORMAL LOW (ref 12.0–15.0)
Immature Granulocytes: 0 %
Immature Granulocytes: 0 %
Lymphocytes Relative: 15 %
Lymphocytes Relative: 20 %
Lymphs Abs: 1.2 10*3/uL (ref 0.7–4.0)
Lymphs Abs: 1.4 10*3/uL (ref 0.7–4.0)
MCH: 24.5 pg — ABNORMAL LOW (ref 26.0–34.0)
MCH: 25.2 pg — ABNORMAL LOW (ref 26.0–34.0)
MCHC: 28.9 g/dL — ABNORMAL LOW (ref 30.0–36.0)
MCHC: 29.5 g/dL — ABNORMAL LOW (ref 30.0–36.0)
MCV: 84.8 fL (ref 78.0–100.0)
MCV: 85.5 fL (ref 78.0–100.0)
Monocytes Absolute: 0.5 10*3/uL (ref 0.1–1.0)
Monocytes Absolute: 0.7 10*3/uL (ref 0.1–1.0)
Monocytes Relative: 6 %
Monocytes Relative: 10 %
Neutro Abs: 6.3 10*3/uL (ref 1.7–7.7)
Neutro Abs: 4.5 10*3/uL (ref 1.7–7.7)
Neutrophils Relative %: 74 %
Neutrophils Relative %: 64 %
Platelets: 285 10*3/uL (ref 150–400)
Platelets: 290 10*3/uL (ref 150–400)
RBC: 3.55 MIL/uL — ABNORMAL LOW (ref 3.87–5.11)
RBC: 3.25 MIL/uL — ABNORMAL LOW (ref 3.87–5.11)
RDW: 16.1 % — ABNORMAL HIGH (ref 11.5–15.5)
RDW: 16.4 % — ABNORMAL HIGH (ref 11.5–15.5)
WBC: 8.4 10*3/uL (ref 4.0–10.5)
WBC: 7.1 10*3/uL (ref 4.0–10.5)

## 2017-10-04 LAB — HEPATIC FUNCTION PANEL
ALT: 22 U/L (ref 14–54)
AST: 29 U/L (ref 15–41)
Albumin: 2.7 g/dL — ABNORMAL LOW (ref 3.5–5.0)
Alkaline Phosphatase: 100 U/L (ref 38–126)
Bilirubin, Direct: <0.1 mg/dL — ABNORMAL LOW (ref 0.1–0.5)
Total Bilirubin: 0.4 mg/dL (ref 0.3–1.2)
Total Protein: 6.5 g/dL (ref 6.5–8.1)

## 2017-10-04 LAB — I-STAT CHEM 8, ED
BUN: 46 mg/dL — ABNORMAL HIGH (ref 6–20)
Calcium, Ion: 1.11 mmol/L — ABNORMAL LOW (ref 1.15–1.40)
Chloride: 115 mmol/L — ABNORMAL HIGH (ref 101–111)
Creatinine, Ser: 1.8 mg/dL — ABNORMAL HIGH (ref 0.44–1.00)
Glucose, Bld: 172 mg/dL — ABNORMAL HIGH (ref 65–99)
HCT: 26 % — ABNORMAL LOW (ref 36.0–46.0)
Hemoglobin: 8.8 g/dL — ABNORMAL LOW (ref 12.0–15.0)
Potassium: 5.3 mmol/L — ABNORMAL HIGH (ref 3.5–5.1)
Sodium: 143 mmol/L (ref 135–145)
TCO2: 19 mmol/L — ABNORMAL LOW (ref 22–32)

## 2017-10-04 LAB — BASIC METABOLIC PANEL
Anion gap: 9 (ref 5–15)
BUN: 43 mg/dL — ABNORMAL HIGH (ref 6–20)
CO2: 21 mmol/L — ABNORMAL LOW (ref 22–32)
Calcium: 8.8 mg/dL — ABNORMAL LOW (ref 8.9–10.3)
Chloride: 112 mmol/L — ABNORMAL HIGH (ref 101–111)
Creatinine, Ser: 1.94 mg/dL — ABNORMAL HIGH (ref 0.44–1.00)
GFR calc Af Amer: 28 mL/min — ABNORMAL LOW (ref >60)
GFR calc non Af Amer: 24 mL/min — ABNORMAL LOW (ref >60)
Glucose, Bld: 138 mg/dL — ABNORMAL HIGH (ref 65–99)
Potassium: 4.8 mmol/L (ref 3.5–5.1)
Sodium: 142 mmol/L (ref 135–145)

## 2017-10-04 LAB — I-STAT TROPONIN, ED: Troponin i, poc: 0.03 ng/mL (ref 0.00–0.08)

## 2017-10-04 LAB — MAGNESIUM: Magnesium: 2.1 mg/dL (ref 1.7–2.4)

## 2017-10-04 LAB — GLUCOSE, CAPILLARY: Glucose-Capillary: 158 mg/dL — ABNORMAL HIGH (ref 65–99)

## 2017-10-04 LAB — TROPONIN I
Troponin I: 0.06 ng/mL (ref <0.03)
Troponin I: 0.05 ng/mL (ref <0.03)
Troponin I: 0.05 ng/mL (ref <0.03)

## 2017-10-04 LAB — TSH: TSH: 8.872 u[IU]/mL — ABNORMAL HIGH (ref 0.350–4.500)

## 2017-10-04 LAB — BRAIN NATRIURETIC PEPTIDE: B Natriuretic Peptide: 4315.3 pg/mL — ABNORMAL HIGH (ref 0.0–100.0)

## 2017-10-04 LAB — CBG MONITORING, ED
Glucose-Capillary: 107 mg/dL — ABNORMAL HIGH (ref 65–99)
Glucose-Capillary: 173 mg/dL — ABNORMAL HIGH (ref 65–99)

## 2017-10-04 MED ORDER — ATORVASTATIN CALCIUM 40 MG PO TABS
40.0000 mg | ORAL_TABLET | Freq: Every day | ORAL | Status: DC
  Administered 2017-10-04 – 2017-10-17 (×14): 40 mg via ORAL
  Filled 2017-10-04 (×15): qty 1

## 2017-10-04 MED ORDER — ACETAMINOPHEN 325 MG PO TABS
650.0000 mg | ORAL_TABLET | Freq: Four times a day (QID) | ORAL | Status: DC | PRN
  Administered 2017-10-05 – 2017-10-16 (×6): 650 mg via ORAL
  Filled 2017-10-04 (×7): qty 2

## 2017-10-04 MED ORDER — TRAMADOL HCL 50 MG PO TABS
50.0000 mg | ORAL_TABLET | Freq: Four times a day (QID) | ORAL | Status: DC | PRN
  Administered 2017-10-04 – 2017-10-17 (×30): 50 mg via ORAL
  Filled 2017-10-04 (×32): qty 1

## 2017-10-04 MED ORDER — INSULIN ASPART 100 UNIT/ML ~~LOC~~ SOLN
0.0000 [IU] | Freq: Three times a day (TID) | SUBCUTANEOUS | Status: DC
  Administered 2017-10-04 – 2017-10-05 (×3): 2 [IU] via SUBCUTANEOUS
  Administered 2017-10-05: 5 [IU] via SUBCUTANEOUS
  Administered 2017-10-05 – 2017-10-06 (×3): 3 [IU] via SUBCUTANEOUS
  Administered 2017-10-06: 1 [IU] via SUBCUTANEOUS
  Administered 2017-10-07 – 2017-10-08 (×2): 2 [IU] via SUBCUTANEOUS
  Administered 2017-10-08: 5 [IU] via SUBCUTANEOUS
  Administered 2017-10-11 (×2): 3 [IU] via SUBCUTANEOUS
  Administered 2017-10-11: 1 [IU] via SUBCUTANEOUS
  Administered 2017-10-13: 2 [IU] via SUBCUTANEOUS
  Administered 2017-10-13: 1 [IU] via SUBCUTANEOUS
  Administered 2017-10-13 – 2017-10-14 (×2): 2 [IU] via SUBCUTANEOUS
  Administered 2017-10-14: 1 [IU] via SUBCUTANEOUS
  Administered 2017-10-14 – 2017-10-16 (×4): 2 [IU] via SUBCUTANEOUS
  Administered 2017-10-17: 1 [IU] via SUBCUTANEOUS
  Administered 2017-10-17 – 2017-10-18 (×2): 2 [IU] via SUBCUTANEOUS
  Administered 2017-10-18: 1 [IU] via SUBCUTANEOUS
  Filled 2017-10-04: qty 1

## 2017-10-04 MED ORDER — PANTOPRAZOLE SODIUM 40 MG PO TBEC
40.0000 mg | DELAYED_RELEASE_TABLET | Freq: Every day | ORAL | Status: DC
  Administered 2017-10-04 – 2017-10-18 (×14): 40 mg via ORAL
  Filled 2017-10-04 (×15): qty 1

## 2017-10-04 MED ORDER — LISINOPRIL 20 MG PO TABS
30.0000 mg | ORAL_TABLET | Freq: Every day | ORAL | Status: DC
  Administered 2017-10-04 – 2017-10-08 (×5): 30 mg via ORAL
  Filled 2017-10-04 (×5): qty 1

## 2017-10-04 MED ORDER — CLOPIDOGREL BISULFATE 75 MG PO TABS
75.0000 mg | ORAL_TABLET | Freq: Every day | ORAL | Status: DC
  Administered 2017-10-04 – 2017-10-18 (×15): 75 mg via ORAL
  Filled 2017-10-04 (×15): qty 1

## 2017-10-04 MED ORDER — POLYETHYLENE GLYCOL 3350 17 G PO PACK
17.0000 g | PACK | Freq: Every day | ORAL | Status: DC | PRN
  Administered 2017-10-18: 17 g via ORAL
  Filled 2017-10-04: qty 1

## 2017-10-04 MED ORDER — LINAGLIPTIN 5 MG PO TABS
5.0000 mg | ORAL_TABLET | Freq: Every day | ORAL | Status: DC
  Administered 2017-10-04 – 2017-10-18 (×15): 5 mg via ORAL
  Filled 2017-10-04 (×17): qty 1

## 2017-10-04 MED ORDER — ONDANSETRON HCL 4 MG/2ML IJ SOLN: 4.0000 mg | Freq: Four times a day (QID) | INTRAMUSCULAR | Status: DC | PRN

## 2017-10-04 MED ORDER — FUROSEMIDE 10 MG/ML IJ SOLN
40.0000 mg | Freq: Once | INTRAMUSCULAR | Status: AC
  Administered 2017-10-04: 40 mg via INTRAVENOUS
  Filled 2017-10-04: qty 4

## 2017-10-04 MED ORDER — ACETAMINOPHEN 650 MG RE SUPP: 650.0000 mg | Freq: Four times a day (QID) | RECTAL | Status: DC | PRN

## 2017-10-04 MED ORDER — ONDANSETRON HCL 4 MG PO TABS: 4.0000 mg | ORAL_TABLET | Freq: Four times a day (QID) | ORAL | Status: DC | PRN

## 2017-10-04 MED ORDER — NITROGLYCERIN 2 % TD OINT
0.5000 [in_us] | TOPICAL_OINTMENT | Freq: Once | TRANSDERMAL | Status: AC
Start: 2017-10-04 — End: 2017-10-04
  Administered 2017-10-04: 0.5 [in_us] via TOPICAL
  Filled 2017-10-04: qty 1

## 2017-10-04 MED ORDER — FUROSEMIDE 10 MG/ML IJ SOLN
40.0000 mg | Freq: Two times a day (BID) | INTRAMUSCULAR | Status: DC
  Administered 2017-10-04 – 2017-10-08 (×10): 40 mg via INTRAVENOUS
  Filled 2017-10-04 (×10): qty 4

## 2017-10-04 MED ORDER — ISOSORBIDE MONONITRATE ER 30 MG PO TB24
30.0000 mg | ORAL_TABLET | Freq: Every day | ORAL | Status: DC
  Administered 2017-10-04 – 2017-10-18 (×14): 30 mg via ORAL
  Filled 2017-10-04 (×15): qty 1

## 2017-10-04 MED ORDER — HYDRALAZINE HCL 25 MG PO TABS
25.0000 mg | ORAL_TABLET | Freq: Three times a day (TID) | ORAL | Status: DC
  Administered 2017-10-04 – 2017-10-09 (×17): 25 mg via ORAL
  Filled 2017-10-04 (×17): qty 1

## 2017-10-04 MED ORDER — ASPIRIN EC 81 MG PO TBEC
81.0000 mg | DELAYED_RELEASE_TABLET | Freq: Every day | ORAL | Status: DC
  Administered 2017-10-04 – 2017-10-18 (×15): 81 mg via ORAL
  Filled 2017-10-04 (×15): qty 1

## 2017-10-04 MED ORDER — INSULIN GLARGINE 100 UNIT/ML ~~LOC~~ SOLN
30.0000 [IU] | Freq: Every day | SUBCUTANEOUS | Status: DC
  Administered 2017-10-06: 30 [IU] via SUBCUTANEOUS
  Filled 2017-10-04 (×4): qty 0.3

## 2017-10-04 MED ORDER — FLUTICASONE PROPIONATE 50 MCG/ACT NA SUSP
1.0000 | Freq: Every day | NASAL | Status: DC | PRN
  Administered 2017-10-05 – 2017-10-07 (×2): 1 via NASAL
  Filled 2017-10-04 (×2): qty 16

## 2017-10-04 MED ORDER — LORATADINE 10 MG PO TABS
10.0000 mg | ORAL_TABLET | Freq: Every day | ORAL | Status: DC
  Administered 2017-10-04 – 2017-10-18 (×15): 10 mg via ORAL
  Filled 2017-10-04 (×15): qty 1

## 2017-10-04 MED ORDER — NITROGLYCERIN 0.4 MG SL SUBL: 0.4000 mg | SUBLINGUAL_TABLET | SUBLINGUAL | Status: DC | PRN

## 2017-10-04 MED ORDER — CARVEDILOL 6.25 MG PO TABS
9.3750 mg | ORAL_TABLET | Freq: Two times a day (BID) | ORAL | Status: DC
  Administered 2017-10-04 – 2017-10-08 (×9): 9.375 mg via ORAL
  Filled 2017-10-04 (×10): qty 1

## 2017-10-04 MED ORDER — CYCLOBENZAPRINE HCL 10 MG PO TABS
10.0000 mg | ORAL_TABLET | Freq: Three times a day (TID) | ORAL | Status: DC
  Administered 2017-10-04 – 2017-10-18 (×42): 10 mg via ORAL
  Filled 2017-10-04 (×42): qty 1

## 2017-10-04 MED ORDER — ENOXAPARIN SODIUM 40 MG/0.4ML ~~LOC~~ SOLN
40.0000 mg | Freq: Every day | SUBCUTANEOUS | Status: DC
  Administered 2017-10-05: 40 mg via SUBCUTANEOUS
  Filled 2017-10-04 (×2): qty 0.4

## 2017-10-04 NOTE — ED Provider Notes (Addendum)
[facility] EMERGENCY DEPARTMENT Provider Note   CSN: [id number] Arrival date & time: 10/04/17  0020     History   Chief Complaint Chief Complaint  Patient presents with  . Shortness of Breath    HPI Emily Miranda is a 77 y.o. female.  The history is provided by the patient.  Shortness of Breath  This is a recurrent problem. The problem occurs continuously.The current episode started more than 1 week ago. The problem has been gradually worsening. Associated symptoms include leg swelling. Pertinent negatives include no fever, no headaches, no coryza, no neck pain, no sputum production, no hemoptysis, no wheezing and no syncope. The problem's precipitants include medical treatment. Risk factors: none, scheduled foor amputation at [facility] on 6/27. The treatment provided no relief. She has had prior hospitalizations. She has had prior ED visits. She has had no prior ICU admissions. Associated medical issues include heart failure. Associated medical issues do not include asthma.    Past Medical History:  Diagnosis Date  . AKI (acute kidney injury) (HCC)   . Aortic stenosis, mild   . CAD (coronary artery disease)    a. 09/2016 NSTEMI/Cath: RCA 70p/m, RPDA 95ost-->Med Rx;  b. 10/2016 PCI: RCA 70p/m (2.25 x 15 Resolute Onyx DES), RPDA 95ost (2.0 x 12 Resolute Onyx DES).  . Charcot foot due to diabetes mellitus (HCC)   . Chronic combined systolic (congestive) and diastolic (congestive) heart failure (HCC)    a. 07/2016 Echo: EF 40-45%, Gr2 DD, mild AS, triv AI, mild MR, mod dil LA.  . Diabetic peripheral neuropathy (HCC)   . Heart murmur   . History of blood transfusion    "related to low HgB"  . History of kidney stones   . History of stomach ulcers   . HTN (hypertension)   . Hyperlipidemia   . Migraine    "maybe once/month" (08/28/2017)  . Myocardial infarction (HCC) 07/2017   while at [facility], s/p cath with DES LAD and CFX  . NSTEMI (non-ST elevated myocardial  infarction) (HCC) 10/06/2016  . Pancreatitis    "my dad died w/it at 35 yr old"  . Physical deconditioning    a. W/C bound.  . Pneumonia 10/2016  . Stasis dermatitis of both legs 05/2017  . Type II diabetes mellitus [facility])     Patient Active Problem List   Diagnosis Date Noted  . Acute on chronic combined systolic and diastolic HF (heart failure) (HCC) 08/29/2017  . Pre-operative clearance 08/28/2017  . Acute on chronic combined systolic and diastolic CHF (congestive heart failure) (HCC) 08/28/2017  . Cellulitis of lower extremity 05/29/2017  . Acute renal failure (ARF) (HCC) 05/29/2017  . Stasis dermatitis of both legs 05/29/2017  . Edema 11/24/2016  . Chronic combined systolic and diastolic heart failure (HCC) 11/09/2016  . Unstable angina (HCC) 11/04/2016  . Charcot foot due to diabetes mellitus (HCC) 10/31/2016  . Chronic anemia 10/31/2016  . Mixed hyperlipidemia   . PVD (peripheral vascular disease) (HCC)   . CAD S/P percutaneous coronary angioplasty   . Non-ST elevation (NSTEMI) myocardial infarction (HCC) 10/06/2016  . NSTEMI (non-ST elevated myocardial infarction) (HCC) 10/06/2016  . CKD (chronic kidney disease), stage III (HCC) 08/27/2016  . Chronic diabetic ulcer of right foot  08/27/2016  . Loss of weight   . Acute combined systolic and diastolic congestive heart failure (HCC)   . Shortness of breath 07/29/2016  . Wound infection 11/13/2015  . Abdominal pain, RLQ (right lower quadrant) -chronic since 2009  06/02/2013  . Constipation, chronic 06/02/2013  . SUI (stress urinary incontinence, female) 06/02/2013  . Recurrent UTI (urinary tract infection) 06/02/2013  . Hypertensive cardiomyopathy, with heart failure (HCC)   . CHEST PAIN 11/27/2009  . OVERWEIGHT 11/28/2008  . DM type 2 causing CKD stage 3 (HCC) 11/25/2008  . Mild aortic stenosis 11/25/2008  . PANCREATITIS, HX OF 11/25/2008    Past Surgical History:  Procedure Laterality Date  . ABDOMINAL HYSTERECTOMY     . APPENDECTOMY    . CARPAL TUNNEL RELEASE Bilateral   . CATARACT EXTRACTION W/ INTRAOCULAR LENS  IMPLANT, BILATERAL Bilateral   . CORONARY ANGIOPLASTY WITH STENT PLACEMENT  07/2017   "@ [facility]"  . CORONARY STENT INTERVENTION N/A 11/05/2016   Procedure: Coronary Stent Intervention;  Surgeon: Lennette Bihari, MD;  Location: [facility] INVASIVE CV LAB;  Service: Cardiovascular;  Laterality: N/A;  . DILATION AND CURETTAGE OF UTERUS    . EXCISIONAL HEMORRHOIDECTOMY    . FOOT SURGERY Right    "ulcer"  . GLAUCOMA SURGERY Bilateral   . LAPAROSCOPIC INCISIONAL / UMBILICAL / VENTRAL HERNIA REPAIR  01/04/2008   Dr Bertram Savin  . LAPAROSCOPIC LYSIS OF ADHESIONS  2007   Dr Donovan Kail  . LEFT HEART CATH AND CORONARY ANGIOGRAPHY N/A 10/11/2016   Procedure: Left Heart Cath and Coronary Angiography;  Surgeon: Yvonne Kendall, MD;  Location: [facility] INVASIVE CV LAB;  Service: Cardiovascular;  Laterality: N/A;  . OVARIAN CYST REMOVAL    . ROBOT ASSISTED PYELOPLASTY  02/2007   Dr Laverle Patter  . SUTURE REMOVAL  08/17/2009   Dr Bertram Savin .  Right paramedian Gore[employer] stitch  . TONSILLECTOMY       OB History   None      Home Medications    Prior to Admission medications   Medication Sig Start Date End Date Taking? Authorizing Provider  Ascorbic Acid (VITAMIN C) 1000 MG tablet Take 1,000 mg by mouth daily.    [provider]  aspirin EC 81 MG tablet Take 1 tablet (81 mg total) by mouth daily. 09/04/17 09/04/18  Berton Bon, NP  atorvastatin (LIPITOR) 40 MG tablet Take 1 tablet (40 mg total) by mouth daily at 6 PM. 08/05/16   Garth Bigness, MD  carvedilol (COREG) 3.125 MG tablet Take 3 tablets (9.375 mg total) by mouth 2 (two) times daily with a meal. 09/04/17   Berton Bon, NP  clopidogrel (PLAVIX) 75 MG tablet Take 1 tablet (75 mg total) by mouth daily with breakfast. 11/10/16   Creig Hines, NP  collagenase (SANTYL) ointment Apply topically daily. Apply to LE plantar surface daily.  06/04/17   Arrien, York Ram, MD  cyclobenzaprine (FLEXERIL) 5 MG tablet Take 10 mg by mouth 3 (three) times daily.  10/16/16   [provider]  diclofenac sodium (VOLTAREN) 1 % GEL Apply 2 g topically 4 (four) times daily. Please apply to let hip. 06/03/17   Arrien, York Ram, MD  fexofenadine (ALLEGRA) 180 MG tablet Take 180 mg by mouth daily.    [provider]  fluticasone (FLONASE) 50 MCG/ACT nasal spray Place 1 spray into both nostrils daily as needed (seasonal allergies).  02/05/16   [provider]  furosemide (LASIX) 40 MG tablet Take 1 tablet (40 mg total) by mouth daily. 09/05/17   Berton Bon, NP  hydrALAZINE (APRESOLINE) 25 MG tablet Take 25 mg by mouth every 8 (eight) hours.    [provider]  HYDROcodone-acetaminophen (NORCO/VICODIN) 5-325 MG tablet Take 1 tablet by mouth  every 6 (six) hours as needed for moderate pain. Patient not taking: Reported on 08/29/2017 06/03/17   Arrien, York Ram, MD  insulin glargine (LANTUS) 100 UNIT/ML injection Inject 0.12 mLs (12 Units total) into the skin at bedtime. 09/04/17   Berton Bon, NP  isosorbide mononitrate (IMDUR) 30 MG 24 hr tablet Take 1 tablet (30 mg total) by mouth daily. 11/09/16   Creig Hines, NP  linagliptin (TRADJENTA) 5 MG TABS tablet Take 5 mg by mouth daily.    [provider]  lisinopril (PRINIVIL,ZESTRIL) 30 MG tablet Take 30 mg by mouth daily.    [provider]  mupirocin cream (BACTROBAN) 2 % Apply 1 application topically 2 (two) times daily. 05/23/17   Asencion Islam, DPM  nitroGLYCERIN (NITROSTAT) 0.4 MG SL tablet Place 1 tablet (0.4 mg total) under the tongue every 5 (five) minutes x 3 doses as needed for chest pain. 11/09/16   Creig Hines, NP  pantoprazole (PROTONIX) 40 MG tablet Take 1 tablet (40 mg total) by mouth daily. 07/04/17   Rollene Rotunda, MD  Polyethyl Glycol-Propyl Glycol (SYSTANE OP) Place 1 drop into both eyes  daily as needed (dry eyes).    [provider]  polyethylene glycol (MIRALAX / GLYCOLAX) packet Take 17 g by mouth daily as needed (constipation). Mix in 8 oz liquid and drink    [provider]  silver sulfADIAZINE (SILVADENE) 1 % cream Apply topically daily. Apply to right lower extremities affected areas, not to the plantar region 06/04/17   Arrien, York Ram, MD    Family History Family History  Problem Relation Age of Onset  . Chronic Renal Failure Mother   . Diabetes Father   . Heart attack Father 42    Social History Social History   Tobacco Use  . Smoking status: Never Smoker  . Smokeless tobacco: Never Used  Substance Use Topics  . Alcohol use: No  . Drug use: No     Allergies   Levofloxacin; Amlodipine; Gabapentin; Hydrochlorothiazide; Ibuprofen; Metformin and related; and Simvastatin   Review of Systems Review of Systems  Constitutional: Negative for fever.  Respiratory: Positive for shortness of breath. Negative for hemoptysis, sputum production and wheezing.   Cardiovascular: Positive for leg swelling. Negative for palpitations and syncope.  Musculoskeletal: Negative for neck pain.  Neurological: Negative for headaches.  All other systems reviewed and are negative.    Physical Exam Updated Vital Signs BP (!) 179/88   Pulse 88   Resp 20   SpO2 100%   Physical Exam  Constitutional: She appears well-developed and well-nourished. No distress.  HENT:  Head: Normocephalic and atraumatic.  Mouth/Throat: No oropharyngeal exudate.  Eyes: Pupils are equal, round, and reactive to light. Conjunctivae are normal.  Neck: Normal range of motion. Neck supple.  Cardiovascular: Normal rate, regular rhythm, normal heart sounds and intact distal pulses.  Pulmonary/Chest: She has rales.  Abdominal: Soft. Bowel sounds are normal. She exhibits no mass. There is no tenderness. There is no rebound and no guarding.  Musculoskeletal: She exhibits  edema.  Neurological: She is alert.  Skin: Skin is warm and dry. Capillary refill takes less than 2 seconds.  Psychiatric: She has a normal mood and affect.     ED Treatments / Results  Labs (all labs ordered are listed, but only abnormal results are displayed) Results for orders placed or performed during the hospital encounter of 10/04/17  CBC with Differential/Platelet  Result Value Ref Range   WBC 8.4 4.0 -  10.5 K/uL   RBC 3.55 (L) 3.87 - 5.11 MIL/uL   Hemoglobin 8.7 (L) 12.0 - 15.0 g/dL   HCT 69.6 (L) 29.5 - 28.4 %   MCV 84.8 78.0 - 100.0 fL   MCH 24.5 (L) 26.0 - 34.0 pg   MCHC 28.9 (L) 30.0 - 36.0 g/dL   RDW 13.2 (H) 44.0 - 10.2 %   Platelets 285 150 - 400 K/uL   Neutrophils Relative % 74 %   Neutro Abs 6.3 1.7 - 7.7 K/uL   Lymphocytes Relative 15 %   Lymphs Abs 1.2 0.7 - 4.0 K/uL   Monocytes Relative 6 %   Monocytes Absolute 0.5 0.1 - 1.0 K/uL   Eosinophils Relative 5 %   Eosinophils Absolute 0.4 0.0 - 0.7 K/uL   Basophils Relative 0 %   Basophils Absolute 0.0 0.0 - 0.1 K/uL   Immature Granulocytes 0 %   Abs Immature Granulocytes 0.0 0.0 - 0.1 K/uL  Brain natriuretic peptide  Result Value Ref Range   B Natriuretic Peptide 4,315.3 (H) 0.0 - 100.0 pg/mL  I-stat chem 8, ed  Result Value Ref Range   Sodium 143 135 - 145 mmol/L   Potassium 5.3 (H) 3.5 - 5.1 mmol/L   Chloride 115 (H) 101 - 111 mmol/L   BUN 46 (H) 6 - 20 mg/dL   Creatinine, Ser 7.25 (H) 0.44 - 1.00 mg/dL   Glucose, Bld 366 (H) 65 - 99 mg/dL   Calcium, Ion 4.40 (L) 1.15 - 1.40 mmol/L   TCO2 19 (L) 22 - 32 mmol/L   Hemoglobin 8.8 (L) 12.0 - 15.0 g/dL   HCT 34.7 (L) 42.5 - 95.6 %  I-stat troponin, ED  Result Value Ref Range   Troponin i, poc 0.03 0.00 - 0.08 ng/mL   Comment 3           Dg Chest Portable 1 View  Result Date: 10/04/2017 CLINICAL DATA:  Short of breath EXAM: PORTABLE CHEST 1 VIEW COMPARISON:  03/01/2017 FINDINGS: Low lung volumes. Cardiomegaly with vascular congestion and mild  diffuse interstitial edema. Small pleural effusions and bibasilar atelectasis. IMPRESSION: Low lung volumes. Cardiomegaly with vascular congestion, mild edema and small pleural effusions. Electronically Signed   By: Jasmine Pang M.D.   On: 10/04/2017 01:18    EKG EKG Interpretation  Date/Time:  Saturday Oct 04 2017 00:38:08 EDT Ventricular Rate:  91 PR Interval:    QRS Duration: 95 QT Interval:  356 QTC Calculation: 438 R Axis:   39 Text Interpretation:  Sinus rhythm Prolonged PR interval Confirmed by Sammuel Blick ([id number]) on 10/04/2017 12:52:30 AM   Radiology Dg Chest Portable 1 View  Result Date: 10/04/2017 CLINICAL DATA:  Short of breath EXAM: PORTABLE CHEST 1 VIEW COMPARISON:  03/01/2017 FINDINGS: Low lung volumes. Cardiomegaly with vascular congestion and mild diffuse interstitial edema. Small pleural effusions and bibasilar atelectasis. IMPRESSION: Low lung volumes. Cardiomegaly with vascular congestion, mild edema and small pleural effusions. Electronically Signed   By: Jasmine Pang M.D.   On: 10/04/2017 01:18    Procedures Procedures (including critical care time)  Medications Ordered in ED Medications  furosemide (LASIX) injection 40 mg (has no administration in time range)  nitroGLYCERIN (NITROGLYN) 2 % ointment 0.5 inch (has no administration in time range)     MDM Number of Diagnoses or Management Options Critical Care Total time providing critical care: 30-74 minutes MDM Reviewed: previous chart, nursing note and vitals Reviewed previous: labs Interpretation: labs, ECG and x-ray (negative troponin, chf on  cxr by me ) Total time providing critical care: 30-74 minutes. This excludes time spent performing separately reportable procedures and services. Consults: admitting MD  CRITICAL CARE Performed by: Jasmine Awe Total critical care time: 60 minutes Critical care time was exclusive of separately billable procedures and treating other  patients. Critical care was necessary to treat or prevent imminent or life-threatening deterioration. Critical care was time spent personally by me on the following activities: development of treatment plan with patient and/or surrogate as well as nursing, discussions with consultants, evaluation of patient's response to treatment, examination of patient, obtaining history from patient or surrogate, ordering and performing treatments and interventions, ordering and review of laboratory studies, ordering and review of radiographic studies, pulse oximetry and re-evaluation of patient's condition.  Final Clinical Impressions(s) / ED Diagnoses   Admit for pulmonary edema   Hulda Reddix, MD 10/04/17 [id number]    Nicanor Alcon, Daissy Yerian, MD 10/04/17 [id number]

## 2017-10-04 NOTE — ED Triage Notes (Signed)
Pt BIB [facility]. EMS from home with c/o increased shortness of breath and abdominal distension. Hx CHF, SpO2 92% room air, 2L [place] O2 improved to 95%. EMS vitals: BP 194/100, HR 92, CBG 206

## 2017-10-04 NOTE — H&P (Signed)
History and Physical    Emily Miranda EAV:[id number] DOB: Sep 11, 1940 DOA: 10/04/2017  PCP: Noni Saupe, MD  Patient coming from: Home.  Chief Complaint: Shortness of breath.  HPI: Emily Miranda is a 77 y.o. female with history of chronic combined systolic and diastolic CHF last EF measured in March 2018 was 40 to 45% recent cardiac cath with stent placement, diabetes mellitus, chronic kidney disease, anemia, hypertension and chart clots foot on the right side plan to have right AKA at [facility] presents to the ER because of sudden onset of shortness of breath since last night.  Patient states she was watching TV when suddenly she started getting worsening shortness of breath.  Patient states she has been chronically short of breath but acutely worsened.  Denies any chest pain productive cough fever or chills.   ED Course: In the ER patient blood pressure was mildly elevated and patient was hypoxic.  Placed on BiPAP and Lasix 40 mg IV was given.  BiPAP was eventually weaned off.  Patient admitted for acute respiratory failure likely from CHF.  EKG shows normal sinus rhythm.  Troponin negative potassium is mildly elevated.  BNP was 4300.  Review of Systems: As per HPI, rest all negative.   Past Medical History:  Diagnosis Date  . AKI (acute kidney injury) (HCC)   . Aortic stenosis, mild   . CAD (coronary artery disease)    a. 09/2016 NSTEMI/Cath: RCA 70p/m, RPDA 95ost-->Med Rx;  b. 10/2016 PCI: RCA 70p/m (2.25 x 15 Resolute Onyx DES), RPDA 95ost (2.0 x 12 Resolute Onyx DES).  . Charcot foot due to diabetes mellitus (HCC)   . Chronic combined systolic (congestive) and diastolic (congestive) heart failure (HCC)    a. 07/2016 Echo: EF 40-45%, Gr2 DD, mild AS, triv AI, mild MR, mod dil LA.  . Diabetic peripheral neuropathy (HCC)   . Heart murmur   . History of blood transfusion    "related to low HgB"  . History of kidney stones   . History of stomach ulcers   . HTN (hypertension)    . Hyperlipidemia   . Migraine    "maybe once/month" (08/28/2017)  . Myocardial infarction (HCC) 07/2017   while at [facility], s/p cath with DES LAD and CFX  . NSTEMI (non-ST elevated myocardial infarction) (HCC) 10/06/2016  . Pancreatitis    "my dad died w/it at 34 yr old"  . Physical deconditioning    a. W/C bound.  . Pneumonia 10/2016  . Stasis dermatitis of both legs 05/2017  . Type II diabetes mellitus (HCC)     Past Surgical History:  Procedure Laterality Date  . ABDOMINAL HYSTERECTOMY    . APPENDECTOMY    . CARPAL TUNNEL RELEASE Bilateral   . CATARACT EXTRACTION W/ INTRAOCULAR LENS  IMPLANT, BILATERAL Bilateral   . CORONARY ANGIOPLASTY WITH STENT PLACEMENT  07/2017   "@ [facility]"  . CORONARY STENT INTERVENTION N/A 11/05/2016   Procedure: Coronary Stent Intervention;  Surgeon: Lennette Bihari, MD;  Location: [facility] INVASIVE CV LAB;  Service: Cardiovascular;  Laterality: N/A;  . DILATION AND CURETTAGE OF UTERUS    . EXCISIONAL HEMORRHOIDECTOMY    . FOOT SURGERY Right    "ulcer"  . GLAUCOMA SURGERY Bilateral   . LAPAROSCOPIC INCISIONAL / UMBILICAL / VENTRAL HERNIA REPAIR  01/04/2008   Dr Bertram Savin  . LAPAROSCOPIC LYSIS OF ADHESIONS  2007   Dr Donovan Kail  . LEFT HEART CATH AND CORONARY ANGIOGRAPHY N/A 10/11/2016  Procedure: Left Heart Cath and Coronary Angiography;  Surgeon: Yvonne Kendall, MD;  Location: [facility] INVASIVE CV LAB;  Service: Cardiovascular;  Laterality: N/A;  . OVARIAN CYST REMOVAL    . ROBOT ASSISTED PYELOPLASTY  02/2007   Dr Laverle Patter  . SUTURE REMOVAL  08/17/2009   Dr Bertram Savin .  Right paramedian GoreTex stitch  . TONSILLECTOMY       reports that she has never smoked. She has never used smokeless tobacco. She reports that she does not drink alcohol or use drugs.  Allergies  Allergen Reactions  . Levofloxacin Nausea And Vomiting and Other (See Comments)    Confusion  . Amlodipine Other (See Comments)    Localized edema  . Gabapentin Other (See Comments)     Disoriented, no strength in legs  . Hydrochlorothiazide Other (See Comments)    Hot and disoriented  . Ibuprofen Other (See Comments)    Affected kidneys - stopped flow of urine Reaction to Advil  . Metformin And Related Other (See Comments)    Affected kidneys - stopped flow of urine  . Simvastatin Other (See Comments)    Chest pain    Family History  Problem Relation Age of Onset  . Chronic Renal Failure Mother   . Diabetes Father   . Heart attack Father 53    Prior to Admission medications   Medication Sig Start Date End Date Taking? Authorizing Provider  Ascorbic Acid (VITAMIN C) 1000 MG tablet Take 1,000 mg by mouth daily.   Yes [provider]  aspirin EC 81 MG tablet Take 1 tablet (81 mg total) by mouth daily. 09/04/17 09/04/18 Yes Berton Bon, NP  atorvastatin (LIPITOR) 40 MG tablet Take 1 tablet (40 mg total) by mouth daily at 6 PM. 08/05/16  Yes Garth Bigness, MD  carvedilol (COREG) 3.125 MG tablet Take 3 tablets (9.375 mg total) by mouth 2 (two) times daily with a meal. 09/04/17  Yes Berton Bon, NP  clopidogrel (PLAVIX) 75 MG tablet Take 1 tablet (75 mg total) by mouth daily with breakfast. 11/10/16  Yes Creig Hines, NP  cyclobenzaprine (FLEXERIL) 10 MG tablet Take 10 mg by mouth 3 (three) times daily.  10/16/16  Yes [provider]  fexofenadine (ALLEGRA) 180 MG tablet Take 180 mg by mouth daily.   Yes [provider]  fluticasone (FLONASE) 50 MCG/ACT nasal spray Place 1 spray into both nostrils daily as needed (seasonal allergies).  02/05/16  Yes [provider]  furosemide (LASIX) 40 MG tablet Take 1 tablet (40 mg total) by mouth daily. 09/05/17  Yes Berton Bon, NP  glimepiride (AMARYL) 4 MG tablet Take 2-4 mg by mouth See admin instructions. Take 1 tablet by mouth every morning, and 1/2 tablet every evening 09/15/17  Yes [provider]  HUMALOG KWIKPEN 100 UNIT/ML KiwkPen Inject 2 Units into the skin  daily with supper. 08/26/17  Yes [provider]  hydrALAZINE (APRESOLINE) 25 MG tablet Take 25 mg by mouth every 8 (eight) hours.   Yes [provider]  insulin glargine (LANTUS) 100 UNIT/ML injection Inject 0.12 mLs (12 Units total) into the skin at bedtime. Patient taking differently: Inject 40 Units into the skin at bedtime.  09/04/17  Yes Berton Bon, NP  isosorbide mononitrate (IMDUR) 30 MG 24 hr tablet Take 1 tablet (30 mg total) by mouth daily. 11/09/16  Yes Creig Hines, NP  linagliptin (TRADJENTA) 5 MG TABS tablet Take 5 mg by mouth daily.   Yes [provider]  lisinopril (PRINIVIL,ZESTRIL) 30 MG tablet Take 30 mg by mouth daily.   Yes [provider]  nitroGLYCERIN (NITROSTAT) 0.4 MG SL tablet Place 1 tablet (0.4 mg total) under the tongue every 5 (five) minutes x 3 doses as needed for chest pain. 11/09/16  Yes Creig Hines, NP  pantoprazole (PROTONIX) 40 MG tablet Take 1 tablet (40 mg total) by mouth daily. 07/04/17  Yes Rollene Rotunda, MD  Polyethyl Glycol-Propyl Glycol (SYSTANE OP) Place 1 drop into both eyes daily as needed (dry eyes).   Yes [provider]  polyethylene glycol (MIRALAX / GLYCOLAX) packet Take 17 g by mouth daily as needed (constipation). Mix in 8 oz liquid and drink   Yes [provider]  collagenase (SANTYL) ointment Apply topically daily. Apply to LE plantar surface daily. Patient not taking: Reported on 10/04/2017 06/04/17   Arrien, York Ram, MD  diclofenac sodium (VOLTAREN) 1 % GEL Apply 2 g topically 4 (four) times daily. Please apply to let hip. Patient not taking: Reported on 10/04/2017 06/03/17   Arrien, York Ram, MD  HYDROcodone-acetaminophen (NORCO/VICODIN) 5-325 MG tablet Take 1 tablet by mouth every 6 (six) hours as needed for moderate pain. Patient not taking: Reported on 08/29/2017 06/03/17   Arrien, York Ram, MD  mupirocin cream (BACTROBAN) 2 % Apply 1  application topically 2 (two) times daily. Patient not taking: Reported on 10/04/2017 05/23/17   Asencion Islam, DPM  silver sulfADIAZINE (SILVADENE) 1 % cream Apply topically daily. Apply to right lower extremities affected areas, not to the plantar region Patient not taking: Reported on 10/04/2017 06/04/17   Coralie Keens, MD    Physical Exam: Vitals:   10/04/17 0245 10/04/17 0300 10/04/17 0315 10/04/17 0355  BP: (!) 178/79 (!) 188/91 (!) 175/75   Pulse: 73 81 71 71  Resp: 14 14 13 15   SpO2: 100% 100% 100% 100%      Constitutional: Moderately built and nourished. Vitals:   10/04/17 0245 10/04/17 0300 10/04/17 0315 10/04/17 0355  BP: (!) 178/79 (!) 188/91 (!) 175/75   Pulse: 73 81 71 71  Resp: 14 14 13 15   SpO2: 100% 100% 100% 100%   Eyes: Anicteric no pallor. ENMT: No discharge from the ears eyes nose or mouth. Neck: JVD elevated no mass felt. Respiratory: No rhonchi mild crepitations. Cardiovascular: S1-S2 heard. Abdomen: Soft nontender bowel sounds present. Musculoskeletal: No edema.  Both feet are deformed. Skin: Right foot is chronic ulcerations.  Which is dressed. Neurologic: Alert awake oriented to time place and person.  Moves all extremities. Psychiatric: Appears normal.  Normal affect.   Labs on Admission: I have personally reviewed following labs and imaging studies  CBC: Recent Labs  Lab 10/04/17 0057 10/04/17 0159  WBC 8.4  --   NEUTROABS 6.3  --   HGB 8.7* 8.8*  HCT 30.1* 26.0*  MCV 84.8  --   PLT 285  --    Basic Metabolic Panel: Recent Labs  Lab 10/04/17 0159  NA 143  K 5.3*  CL 115*  GLUCOSE 172*  BUN 46*  CREATININE 1.80*   GFR: CrCl cannot be calculated (Unknown ideal weight.). Liver Function Tests: No results for input(s): AST, ALT, ALKPHOS, BILITOT, PROT, ALBUMIN in the last 168 hours. No results for input(s): LIPASE, AMYLASE in the last 168 hours. No results for input(s): AMMONIA in the last 168 hours. Coagulation  Profile: No results for input(s): INR, PROTIME in the last 168 hours. Cardiac Enzymes: No results for input(s): CKTOTAL, CKMB, CKMBINDEX,  TROPONINI in the last 168 hours. BNP (last 3 results) No results for input(s): PROBNP in the last 8760 hours. HbA1C: No results for input(s): HGBA1C in the last 72 hours. CBG: No results for input(s): GLUCAP in the last 168 hours. Lipid Profile: No results for input(s): CHOL, HDL, LDLCALC, TRIG, CHOLHDL, LDLDIRECT in the last 72 hours. Thyroid Function Tests: No results for input(s): TSH, T4TOTAL, FREET4, T3FREE, THYROIDAB in the last 72 hours. Anemia Panel: No results for input(s): VITAMINB12, FOLATE, FERRITIN, TIBC, IRON, RETICCTPCT in the last 72 hours. Urine analysis:    Component Value Date/Time   COLORURINE STRAW (A) 09/01/2017 2005   APPEARANCEUR CLEAR 09/01/2017 2005   LABSPEC 1.005 09/01/2017 2005   PHURINE 5.0 09/01/2017 2005   GLUCOSEU NEGATIVE 09/01/2017 2005   HGBUR NEGATIVE 09/01/2017 2005   BILIRUBINUR NEGATIVE 09/01/2017 2005   KETONESUR NEGATIVE 09/01/2017 2005   PROTEINUR 30 (A) 09/01/2017 2005   UROBILINOGEN 0.2 03/03/2007 1337   NITRITE NEGATIVE 09/01/2017 2005   LEUKOCYTESUR NEGATIVE 09/01/2017 2005   Sepsis Labs: @LABRCNTIP (procalcitonin:4,lacticidven:4) )No results found for this or any previous visit (from the past 240 hour(s)).   Radiological Exams on Admission: Dg Chest Portable 1 View  Result Date: 10/04/2017 CLINICAL DATA:  Short of breath EXAM: PORTABLE CHEST 1 VIEW COMPARISON:  03/01/2017 FINDINGS: Low lung volumes. Cardiomegaly with vascular congestion and mild diffuse interstitial edema. Small pleural effusions and bibasilar atelectasis. IMPRESSION: Low lung volumes. Cardiomegaly with vascular congestion, mild edema and small pleural effusions. Electronically Signed   By: Jasmine Pang M.D.   On: 10/04/2017 01:18    EKG: Independently reviewed.  Normal sinus rhythm.  Assessment/Plan Principal Problem:    Acute on chronic combined systolic and diastolic HF (heart failure) (HCC) Active Problems:   DM type 2 causing CKD stage 3 (HCC)   PVD (peripheral vascular disease) (HCC)   CAD S/P percutaneous coronary angioplasty   Charcot foot due to diabetes mellitus (HCC)   CHF (congestive heart failure) (HCC)   Acute respiratory failure with hypoxia (HCC)    1. Acute respiratory failure secondary to acute on chronic combined systolic and diastolic CHF last EF measured in March 2018 was 40 to 45% with grade 2 diastolic dysfunction -patient is placed on Lasix 40 mg IV every 12.  BiPAP was successfully weaned off.  Given the recent history of stent placement we will cycle cardiac markers.  Continue with hydralazine Imdur lisinopril and closely follow intake output metabolic panel. 2. CAD status post stenting denies any chest pain.  We will cycle cardiac markers due to acute shortness of breath.  Patient is on aspirin Plavix beta-blocker Imdur and statins. 3. Chronic kidney disease stage III creatinine appears to be at baseline.  Follow metabolic panel. 4. Hypertension uncontrolled -we will keep patient on PRN IV hydralazine along with home dose of metoprolol hydralazine lisinopril Imdur and presently on IV Lasix. 5. Anemia likely from chronic kidney disease follow CBC. 6. Diabetes mellitus type 2 we will decrease Lantus dose from 40-30 along with sliding scale coverage.   7. Charcot's right foot with ulceration plan for above-knee amputation at [facility].   DVT prophylaxis: Lovenox. Code Status: Full code. Family Communication: Discussed with patient. Disposition Plan: Home. Consults called: None. Admission status: Inpatient.   Eduard Clos MD [facility] Pager [phone].  If 7PM-7AM, please contact night-coverage [email or web address] Password [email or web address]  10/04/2017, 5:29 AM

## 2017-10-04 NOTE — Consult Note (Signed)
WOC Nurse wound consult note Reason for Consult: Management of bilateral LEs.  Patient is followed by outpatient [facility] and is scheduled for an ambulation of the right foot on June 27th. Has HHRN who has been applying bilateral Unna's Boots twice weekly for 4 weeks, but the fluid mobilization has caused knee swelling and pain and SOB. Wound type: Venous insufficiency, arterial insufficiency Pressure Injury POA: NA Measurement: Left anterior LE wound:  5cm x 4cm x 0.1cm with pink, moist wound bed and small amount serous exudate. Right superior anterior LE wound: 4x4cm x 0.1cm with pink, moist wound bed and small amount of serous exudate Right inferior anterior LE wound: 3cm x 3.5cm x 0.1cm with pink, moist wound bed and small amount of serous exudate. Right foot, medial aspect: 1cm round with depth obscured by the presence of yellow nonviable tissue. Right great toe:  1.5cm round x 0.1cm depth. No drainage Wound bed:As described above Drainage (amount, consistency, odor) As described above Periwound: ruddy red in appearance,  Dressing procedure/placement/frequency: I will discontinue the bilateral Unna's Boots for the time being, she can resume upon discharge with the assistance of her HHRN. In house. We will dress wounds daily with an antimicrobial astringent (Xeroform gauze) and secure with Kerlix roll gauze and ACE bandages (mild compression) and use elevation to control edema.  WOC nursing team will not follow, but will remain available to this patient, the nursing and medical teams.  Please re-consult if needed. Thanks, Ladona Mow, MSN, RN, GNP, [place]  Pager# [phone]

## 2017-10-04 NOTE — ED Notes (Signed)
Nurse will draw labs. 

## 2017-10-05 DIAGNOSIS — L8990 Pressure ulcer of unspecified site, unspecified stage: Secondary | ICD-10-CM

## 2017-10-05 DIAGNOSIS — J810 Acute pulmonary edema: Secondary | ICD-10-CM

## 2017-10-05 LAB — GLUCOSE, CAPILLARY
Glucose-Capillary: 178 mg/dL — ABNORMAL HIGH (ref 65–99)
Glucose-Capillary: 220 mg/dL — ABNORMAL HIGH (ref 65–99)
Glucose-Capillary: 254 mg/dL — ABNORMAL HIGH (ref 65–99)
Glucose-Capillary: 154 mg/dL — ABNORMAL HIGH (ref 65–99)
Glucose-Capillary: 103 mg/dL — ABNORMAL HIGH (ref 65–99)

## 2017-10-05 LAB — CBC
HCT: 25.4 % — ABNORMAL LOW (ref 36.0–46.0)
Hemoglobin: 7.4 g/dL — ABNORMAL LOW (ref 12.0–15.0)
MCH: 25.1 pg — ABNORMAL LOW (ref 26.0–34.0)
MCHC: 29.1 g/dL — ABNORMAL LOW (ref 30.0–36.0)
MCV: 86.1 fL (ref 78.0–100.0)
Platelets: 252 10*3/uL (ref 150–400)
RBC: 2.95 MIL/uL — ABNORMAL LOW (ref 3.87–5.11)
RDW: 16.5 % — ABNORMAL HIGH (ref 11.5–15.5)
WBC: 7.2 10*3/uL (ref 4.0–10.5)

## 2017-10-05 LAB — PREPARE RBC (CROSSMATCH)

## 2017-10-05 MED ORDER — SODIUM CHLORIDE 0.9 % IV SOLN
Freq: Once | INTRAVENOUS | Status: AC
  Administered 2017-10-05: 16:00:00 via INTRAVENOUS

## 2017-10-05 NOTE — Progress Notes (Signed)
PROGRESS NOTE    Emily Miranda  KDT:[id number] DOB: December 03, 1940 DOA: 10/04/2017 PCP: Noni Saupe, MD    Brief Narrative: Emily Miranda is a 77 y.o. female with history of chronic combined systolic and diastolic CHF last EF measured in March 2018 was 40 to 45% recent cardiac cath with stent placement, diabetes mellitus, chronic kidney disease, anemia, hypertension and chart clots foot on the right side plan to have right AKA at [facility] presents to the ER because of sudden onset of shortness of breath since one day. She was found to be in acute respiratory failure with hypoxia. She initially required BIPAP and transitioned to [place] oxygen to keep sats greater than 90%.      Assessment & Plan:   Principal Problem:   Acute on chronic combined systolic and diastolic HF (heart failure) (HCC) Active Problems:   DM type 2 causing CKD stage 3 (HCC)   PVD (peripheral vascular disease) (HCC)   CAD S/P percutaneous coronary angioplasty   Charcot foot due to diabetes mellitus (HCC)   CHF (congestive heart failure) (HCC)   Acute respiratory failure with hypoxia (HCC)   Pressure injury of skin   Acute respiratory failure with hypoxia sec to acute on chronic combined systolic and diastoic heart failure.  Admitted to stepdown.  Initially required BiPAP later on transition to nasal cannula oxygen to keep sats greater than 90%. Continue with IV Lasix 40 mg twice daily.  Patient has diuresed up to 3 L since admission.  Resume home medication Coreg, Plavix.  Lisinopril. Radiology will be consulted in the morning.    Hypertension Well controlled.  Resume Coreg, Lasix, lisinopril.   Insulin-dependent diabetes mellitus  CBG (last 3)  Recent Labs    10/04/17 2150 10/05/17 0736 10/05/17 1134  GLUCAP 178* 220* 254*   Not well controlled with hyperglycemia.  On 30 units of Lantus and SSI.  Change SSI to moderate scale SSI.  Multiple foot ulcers on the right Wound care consult  consulted.  Elevated troponins minimal and possibly secondary to acute CHF.    Anemia of chronic disease Normocytic transfuse to keep hemoglobin greater than 8.  1 unit of PRBC transfusion will be ordered.  Repeat H&H in the morning.   DVT prophylaxis: (lovenox.  Code Status: full code.  Family Communication:  None at bedside.  Disposition Plan:  Pending resolution of the respiratory failure.   Consultants:   Cardiology will be consulted in am.    Procedures: NOne.   Antimicrobials:none   Subjective: Reports breathing has improved. No cough or chest pain.    Objective: Vitals:   10/05/17 0338 10/05/17 0548 10/05/17 0900 10/05/17 1001  BP: (!) 160/68 136/60 (!) 151/75 (!) 143/79  Pulse: 64 66 75   Resp: 16     Temp: 98.4 F (36.9 C)     TempSrc: Oral     SpO2: 100% 97%    Weight:  73.2 kg (161 lb 4.8 oz)    Height:        Intake/Output Summary (Last 24 hours) at 10/05/2017 1212 Last data filed at 10/05/2017 1026 Gross per 24 hour  Intake 580 ml  Output 1850 ml  Net -1270 ml   Filed Weights   10/04/17 1610 10/05/17 0548  Weight: 74.5 kg (164 lb 4.8 oz) 73.2 kg (161 lb 4.8 oz)    Examination:  General exam: Appears calm and comfortable on 4 lit of [place] oxygen.  Respiratory system: diminished air entry at bases, no  wheezing or rhonchi.  Cardiovascular system: S1 & S2 heard, RRR. No JVD, murmurs, .pedal edema with multiple ulcers on the right foot.  Gastrointestinal system: Abdomen is nondistended, soft and nontender. No organomegaly or masses felt. Normal bowel sounds heard. Central nervous system: Alert and oriented. No focal neurological deficits. Extremities: deformed right foot with multiple ulcerations.  Skin: No rashes, lesions or ulcers Psychiatry:  Mood & affect appropriate.     Data Reviewed: I have personally reviewed following labs and imaging studies  CBC: Recent Labs  Lab 10/04/17 0057 10/04/17 0159 10/04/17 0538 10/05/17 1037  WBC  8.4  --  7.1 7.2  NEUTROABS 6.3  --  4.5  --   HGB 8.7* 8.8* 8.2* 7.4*  HCT 30.1* 26.0* 27.8* 25.4*  MCV 84.8  --  85.5 86.1  PLT 285  --  290 252   Basic Metabolic Panel: Recent Labs  Lab 10/04/17 0159 10/04/17 0538  NA 143 142  K 5.3* 4.8  CL 115* 112*  CO2  --  21*  GLUCOSE 172* 138*  BUN 46* 43*  CREATININE 1.80* 1.94*  CALCIUM  --  8.8*  MG  --  2.1   GFR: Estimated Creatinine Clearance: 25.7 mL/min (A) (by C-G formula based on SCr of 1.94 mg/dL (H)). Liver Function Tests: Recent Labs  Lab 10/04/17 0538  AST 29  ALT 22  ALKPHOS 100  BILITOT 0.4  PROT 6.5  ALBUMIN 2.7*   No results for input(s): LIPASE, AMYLASE in the last 168 hours. No results for input(s): AMMONIA in the last 168 hours. Coagulation Profile: No results for input(s): INR, PROTIME in the last 168 hours. Cardiac Enzymes: Recent Labs  Lab 10/04/17 0538 10/04/17 1351 10/04/17 1620  TROPONINI 0.06* 0.05* 0.05*   BNP (last 3 results) No results for input(s): PROBNP in the last 8760 hours. HbA1C: No results for input(s): HGBA1C in the last 72 hours. CBG: Recent Labs  Lab 10/04/17 1156 10/04/17 1634 10/04/17 2150 10/05/17 0736 10/05/17 1134  GLUCAP 173* 158* 178* 220* 254*   Lipid Profile: No results for input(s): CHOL, HDL, LDLCALC, TRIG, CHOLHDL, LDLDIRECT in the last 72 hours. Thyroid Function Tests: Recent Labs    10/04/17 0538  TSH 8.872*   Anemia Panel: No results for input(s): VITAMINB12, FOLATE, FERRITIN, TIBC, IRON, RETICCTPCT in the last 72 hours. Sepsis Labs: No results for input(s): PROCALCITON, LATICACIDVEN in the last 168 hours.  No results found for this or any previous visit (from the past 240 hour(s)).       Radiology Studies: Dg Chest Portable 1 View  Result Date: 10/04/2017 CLINICAL DATA:  Short of breath EXAM: PORTABLE CHEST 1 VIEW COMPARISON:  03/01/2017 FINDINGS: Low lung volumes. Cardiomegaly with vascular congestion and mild diffuse interstitial  edema. Small pleural effusions and bibasilar atelectasis. IMPRESSION: Low lung volumes. Cardiomegaly with vascular congestion, mild edema and small pleural effusions. Electronically Signed   By: Jasmine Pang M.D.   On: 10/04/2017 01:18        Scheduled Meds: . aspirin EC  81 mg Oral Daily  . atorvastatin  40 mg Oral q1800  . carvedilol  9.375 mg Oral BID WC  . clopidogrel  75 mg Oral Q breakfast  . cyclobenzaprine  10 mg Oral TID  . enoxaparin (LOVENOX) injection  40 mg Subcutaneous Daily  . furosemide  40 mg Intravenous Q12H  . hydrALAZINE  25 mg Oral Q8H  . insulin aspart  0-9 Units Subcutaneous TID WC  . insulin glargine  30 Units Subcutaneous QHS  . isosorbide mononitrate  30 mg Oral Daily  . linagliptin  5 mg Oral Daily  . lisinopril  30 mg Oral Daily  . loratadine  10 mg Oral Daily  . pantoprazole  40 mg Oral Daily   Continuous Infusions:   LOS: 1 day    Time spent: 35 minutes.     Kathlen Mody, MD [facility] Pager [phone]  If 7PM-7AM, please contact night-coverage [email or web address] Password [email or web address] 10/05/2017, 12:12 PM

## 2017-10-05 NOTE — Progress Notes (Signed)
Pt had an episode of 2nd degree type 1 and type 2, pt was forcefully blowing her nose when this happened, pt's HR went to 37, during this episode, MD notified, will continue to monitor, Thanks, Lavonda Jumbo RN

## 2017-10-05 NOTE — Progress Notes (Signed)
Patient resting comfortably during shift report. Denies complaints.  

## 2017-10-05 NOTE — Plan of Care (Signed)
  Problem: Nutrition: Goal: Adequate nutrition will be maintained Outcome: Completed/Met

## 2017-10-06 LAB — GLUCOSE, CAPILLARY
Glucose-Capillary: 123 mg/dL — ABNORMAL HIGH (ref 65–99)
Glucose-Capillary: 249 mg/dL — ABNORMAL HIGH (ref 65–99)
Glucose-Capillary: 210 mg/dL — ABNORMAL HIGH (ref 65–99)
Glucose-Capillary: 129 mg/dL — ABNORMAL HIGH (ref 65–99)

## 2017-10-06 LAB — TYPE AND SCREEN
ABO/RH(D): O POS
Antibody Screen: NEGATIVE
Unit division: 0

## 2017-10-06 LAB — HEMOGLOBIN AND HEMATOCRIT, BLOOD
HCT: 29.7 % — ABNORMAL LOW (ref 36.0–46.0)
Hemoglobin: 9 g/dL — ABNORMAL LOW (ref 12.0–15.0)

## 2017-10-06 LAB — BPAM RBC
Blood Product Expiration Date: 201905292359
ISSUE DATE / TIME: 201905261624
Unit Type and Rh: 5100

## 2017-10-06 LAB — BASIC METABOLIC PANEL
Anion gap: 6 (ref 5–15)
BUN: 45 mg/dL — ABNORMAL HIGH (ref 6–20)
CO2: 23 mmol/L (ref 22–32)
Calcium: 8.5 mg/dL — ABNORMAL LOW (ref 8.9–10.3)
Chloride: 111 mmol/L (ref 101–111)
Creatinine, Ser: 1.89 mg/dL — ABNORMAL HIGH (ref 0.44–1.00)
GFR calc Af Amer: 29 mL/min — ABNORMAL LOW (ref >60)
GFR calc non Af Amer: 25 mL/min — ABNORMAL LOW (ref >60)
Glucose, Bld: 148 mg/dL — ABNORMAL HIGH (ref 65–99)
Potassium: 4.5 mmol/L (ref 3.5–5.1)
Sodium: 140 mmol/L (ref 135–145)

## 2017-10-06 LAB — T4, FREE: Free T4: 1.02 ng/dL (ref 0.82–1.77)

## 2017-10-06 MED ORDER — ENOXAPARIN SODIUM 30 MG/0.3ML ~~LOC~~ SOLN
30.0000 mg | Freq: Every day | SUBCUTANEOUS | Status: DC
  Administered 2017-10-06 – 2017-10-18 (×12): 30 mg via SUBCUTANEOUS
  Filled 2017-10-06 (×12): qty 0.3

## 2017-10-06 NOTE — Progress Notes (Signed)
Patient resting comfortably during shift report. Denies complaints.  

## 2017-10-06 NOTE — Progress Notes (Signed)
PROGRESS NOTE    Emily Miranda  WUJ:[id number] DOB: 22-Oct-1940 DOA: 10/04/2017 PCP: Noni Saupe, MD    Brief Narrative: Emily Miranda is a 77 y.o. female with history of chronic combined systolic and diastolic CHF last EF measured in March 2018 was 40 to 45% recent cardiac cath with stent placement, diabetes mellitus, chronic kidney disease, anemia, hypertension and chart clots foot on the right side plan to have right AKA at [facility] presents to the ER because of sudden onset of shortness of breath since one day. She was found to be in acute respiratory failure with hypoxia. She initially required BIPAP and transitioned to [place] oxygen to keep sats greater than 90%.      Assessment & Plan:   Principal Problem:   Acute on chronic combined systolic and diastolic HF (heart failure) (HCC) Active Problems:   DM type 2 causing CKD stage 3 (HCC)   PVD (peripheral vascular disease) (HCC)   CAD S/P percutaneous coronary angioplasty   Charcot foot due to diabetes mellitus (HCC)   CHF (congestive heart failure) (HCC)   Acute respiratory failure with hypoxia (HCC)   Pressure injury of skin   Acute respiratory failure with hypoxia sec to acute on chronic combined systolic and diastoic heart failure.  Admitted to stepdown.  Initially required BiPAP later on transition to nasal cannula oxygen to keep sats greater than 90%. Continue with IV Lasix 40 mg twice daily.  Patient has diuresed up to 3.5 L since admission.  Resume home medication Coreg, Plavix.  Lisinopril.     Hypertension Well controlled.  Resume Coreg, Lasix, lisinopril.   Insulin-dependent diabetes mellitus  CBG (last 3)  Recent Labs    10/05/17 2134 10/06/17 0743 10/06/17 1131  GLUCAP 103* 123* 249*   Better controlled with the same regimen.  On 30 units of Lantus and SSI.  Change SSI to moderate scale SSI.  Right foot ulcer/ right pressure ulcer on the foot:  Wound care consult consulted and recommendations  given.  Elevated troponins minimal and possibly secondary to acute CHF.pt denies chest pain.   Anemia of chronic disease Normocytic transfuse to keep hemoglobin greater than 8.  1 unit of PRBC transfusion will be ordered.  Repeat H&H is around 9.    DVT prophylaxis: (lovenox.  Code Status: full code.  Family Communication:  None at bedside.  Disposition Plan:  Pending resolution of the respiratory failure. Possibly home tomorrow.   Consultants:   None.    Procedures: None.   Antimicrobials:none   Subjective: Reports breathing has improved. No cough or chest pain.    Objective: Vitals:   10/06/17 0403 10/06/17 0600 10/06/17 1128 10/06/17 1221  BP: (!) 148/58  (!) 157/98 (!) 160/70  Pulse: (!) 36 75 82 76  Resp: 20  16 18   Temp: 97.8 F (36.6 C)   98 F (36.7 C)  TempSrc: Oral   Oral  SpO2: 100%   98%  Weight:  73.1 kg (161 lb 3.2 oz)    Height:        Intake/Output Summary (Last 24 hours) at 10/06/2017 1540 Last data filed at 10/06/2017 1444 Gross per 24 hour  Intake 1642.17 ml  Output 2050 ml  Net -407.83 ml   Filed Weights   10/04/17 1610 10/05/17 0548 10/06/17 0600  Weight: 74.5 kg (164 lb 4.8 oz) 73.2 kg (161 lb 4.8 oz) 73.1 kg (161 lb 3.2 oz)    Examination:  General exam: Appears calm and comfortable  on 4 lit of [place] oxygen.  Respiratory system: good air entry fair, no wheezing or rhonchi.  Cardiovascular system: S1 & S2 heard, RRR. No JVD, murmurs, .pedal edema with multiple ulcers on the right foot.  Gastrointestinal system: Abdomen is soft non tender non distended bowel sounds heard.  Central nervous system: Alert and oriented. Non focal.  Extremities: deformed right foot with multiple ulcerations.  Skin: No rashes, lesions or ulcers Psychiatry:  Mood & affect appropriate.     Data Reviewed: I have personally reviewed following labs and imaging studies  CBC: Recent Labs  Lab 10/04/17 0057 10/04/17 0159 10/04/17 0538 10/05/17 1037  10/06/17 0951  WBC 8.4  --  7.1 7.2  --   NEUTROABS 6.3  --  4.5  --   --   HGB 8.7* 8.8* 8.2* 7.4* 9.0*  HCT 30.1* 26.0* 27.8* 25.4* 29.7*  MCV 84.8  --  85.5 86.1  --   PLT 285  --  290 252  --    Basic Metabolic Panel: Recent Labs  Lab 10/04/17 0159 10/04/17 0538 10/06/17 0356  NA 143 142 140  K 5.3* 4.8 4.5  CL 115* 112* 111  CO2  --  21* 23  GLUCOSE 172* 138* 148*  BUN 46* 43* 45*  CREATININE 1.80* 1.94* 1.89*  CALCIUM  --  8.8* 8.5*  MG  --  2.1  --    GFR: Estimated Creatinine Clearance: 26.3 mL/min (A) (by C-G formula based on SCr of 1.89 mg/dL (H)). Liver Function Tests: Recent Labs  Lab 10/04/17 0538  AST 29  ALT 22  ALKPHOS 100  BILITOT 0.4  PROT 6.5  ALBUMIN 2.7*   No results for input(s): LIPASE, AMYLASE in the last 168 hours. No results for input(s): AMMONIA in the last 168 hours. Coagulation Profile: No results for input(s): INR, PROTIME in the last 168 hours. Cardiac Enzymes: Recent Labs  Lab 10/04/17 0538 10/04/17 1351 10/04/17 1620  TROPONINI 0.06* 0.05* 0.05*   BNP (last 3 results) No results for input(s): PROBNP in the last 8760 hours. HbA1C: No results for input(s): HGBA1C in the last 72 hours. CBG: Recent Labs  Lab 10/05/17 1134 10/05/17 1641 10/05/17 2134 10/06/17 0743 10/06/17 1131  GLUCAP 254* 154* 103* 123* 249*   Lipid Profile: No results for input(s): CHOL, HDL, LDLCALC, TRIG, CHOLHDL, LDLDIRECT in the last 72 hours. Thyroid Function Tests: Recent Labs    10/04/17 0538 10/06/17 0951  TSH 8.872*  --   FREET4  --  1.02   Anemia Panel: No results for input(s): VITAMINB12, FOLATE, FERRITIN, TIBC, IRON, RETICCTPCT in the last 72 hours. Sepsis Labs: No results for input(s): PROCALCITON, LATICACIDVEN in the last 168 hours.  No results found for this or any previous visit (from the past 240 hour(s)).       Radiology Studies: No results found.      Scheduled Meds: . aspirin EC  81 mg Oral Daily  .  atorvastatin  40 mg Oral q1800  . carvedilol  9.375 mg Oral BID WC  . clopidogrel  75 mg Oral Q breakfast  . cyclobenzaprine  10 mg Oral TID  . enoxaparin (LOVENOX) injection  30 mg Subcutaneous Daily  . furosemide  40 mg Intravenous Q12H  . hydrALAZINE  25 mg Oral Q8H  . insulin aspart  0-9 Units Subcutaneous TID WC  . insulin glargine  30 Units Subcutaneous QHS  . isosorbide mononitrate  30 mg Oral Daily  . linagliptin  5 mg Oral  Daily  . lisinopril  30 mg Oral Daily  . loratadine  10 mg Oral Daily  . pantoprazole  40 mg Oral Daily   Continuous Infusions:   LOS: 2 days    Time spent: 35 minutes.     Kathlen Mody, MD [facility] Pager [phone]  If 7PM-7AM, please contact night-coverage [email or web address] Password [facility] 10/06/2017, 3:40 PM

## 2017-10-07 ENCOUNTER — Inpatient Hospital Stay (HOSPITAL_COMMUNITY): Payer: Medicare Other

## 2017-10-07 DIAGNOSIS — I509 Heart failure, unspecified: Secondary | ICD-10-CM

## 2017-10-07 LAB — BASIC METABOLIC PANEL
Anion gap: 8 (ref 5–15)
BUN: 42 mg/dL — ABNORMAL HIGH (ref 6–20)
CO2: 21 mmol/L — ABNORMAL LOW (ref 22–32)
Calcium: 8.7 mg/dL — ABNORMAL LOW (ref 8.9–10.3)
Chloride: 112 mmol/L — ABNORMAL HIGH (ref 101–111)
Creatinine, Ser: 1.71 mg/dL — ABNORMAL HIGH (ref 0.44–1.00)
GFR calc Af Amer: 33 mL/min — ABNORMAL LOW (ref >60)
GFR calc non Af Amer: 28 mL/min — ABNORMAL LOW (ref >60)
Glucose, Bld: 62 mg/dL — ABNORMAL LOW (ref 65–99)
Potassium: 4.1 mmol/L (ref 3.5–5.1)
Sodium: 141 mmol/L (ref 135–145)

## 2017-10-07 LAB — ECHOCARDIOGRAM COMPLETE
Height: 66 in
Weight: 2502.4 oz

## 2017-10-07 LAB — GLUCOSE, CAPILLARY
Glucose-Capillary: 57 mg/dL — ABNORMAL LOW (ref 65–99)
Glucose-Capillary: 137 mg/dL — ABNORMAL HIGH (ref 65–99)
Glucose-Capillary: 168 mg/dL — ABNORMAL HIGH (ref 65–99)
Glucose-Capillary: 108 mg/dL — ABNORMAL HIGH (ref 65–99)
Glucose-Capillary: 165 mg/dL — ABNORMAL HIGH (ref 65–99)

## 2017-10-07 MED ORDER — INSULIN GLARGINE 100 UNIT/ML ~~LOC~~ SOLN
20.0000 [IU] | Freq: Every day | SUBCUTANEOUS | Status: DC
  Administered 2017-10-08 – 2017-10-09 (×2): 20 [IU] via SUBCUTANEOUS
  Filled 2017-10-07 (×3): qty 0.2

## 2017-10-07 NOTE — Progress Notes (Signed)
PROGRESS NOTE    BRIANNAH LONA  Miranda:[id number] DOB: 1941-02-15 DOA: 10/04/2017 PCP: Noni Saupe, MD    Brief Narrative: Emily Miranda is a 77 y.o. female with history of chronic combined systolic and diastolic CHF last EF measured in March 2018 was 40 to 45% recent cardiac cath with stent placement, diabetes mellitus, chronic kidney disease, anemia, hypertension and chart clots foot on the right side plan to have right AKA at [facility] presents to the ER because of sudden onset of shortness of breath since one day. She was found to be in acute respiratory failure with hypoxia. She initially required BIPAP and transitioned to [place] oxygen to keep sats greater than 90%.      Assessment & Plan:   Principal Problem:   Acute on chronic combined systolic and diastolic HF (heart failure) (HCC) Active Problems:   DM type 2 causing CKD stage 3 (HCC)   PVD (peripheral vascular disease) (HCC)   CAD S/P percutaneous coronary angioplasty   Charcot foot due to diabetes mellitus (HCC)   CHF (congestive heart failure) (HCC)   Acute respiratory failure with hypoxia (HCC)   Pressure injury of skin   Acute respiratory failure with hypoxia sec to acute on chronic combined systolic and diastoic heart failure.  Admitted to stepdown.  Initially required BiPAP later on transition to nasal cannula oxygen to keep sats greater than 90%. Continue with IV Lasix 40 mg twice daily.  Patient has diuresed up to 5 L since admission.  Resume home medication Coreg, Plavix.  Lisinopril. Echocardiogram ordered and pending.  Cardiology will be consulted depending on the echocardiogram.      Hypertension Well controlled.  Resume Coreg, Lasix, lisinopril.   Insulin-dependent diabetes mellitus  CBG (last 3)  Recent Labs    10/07/17 0729 10/07/17 0909 10/07/17 1156  GLUCAP 57* 137* 168*   An episode of hypoglycemia this am, decrease the dose of lantus from 30 to 20 units and resume with SSI.   Right  foot ulcer/ right pressure ulcer on the foot:  Wound care consult consulted and recommendations given.  Elevated troponins minimal and possibly secondary to acute CHF.pt denies chest pain.   Anemia of chronic disease Normocytic transfuse to keep hemoglobin greater than 8.  1 unit of PRBC transfusion ordered.  Repeat H&H is around 9.    DVT prophylaxis: (lovenox.  Code Status: full code.  Family Communication:  None at bedside.  Disposition Plan:  Pending resolution of the respiratory failure. Possibly home in the next 24 hours.   Consultants:   None.    Procedures: None.   Antimicrobials:none   Subjective: Reports no change in breathing in the last 24 hours.  Wean her off the oxygen as appropriate.     Objective: Vitals:   10/06/17 1221 10/06/17 1939 10/07/17 0441 10/07/17 1157  BP: (!) 160/70 (!) 152/65 (!) 178/77 (!) 157/65  Pulse: 76 64 74 72  Resp: 18 18 18    Temp: 98 F (36.7 C) (!) 97.5 F (36.4 C) 98.3 F (36.8 C)   TempSrc: Oral Oral Oral   SpO2: 98% 100% 100% 98%  Weight:   70.9 kg (156 lb 6.4 oz)   Height:        Intake/Output Summary (Last 24 hours) at 10/07/2017 1507 Last data filed at 10/07/2017 1035 Gross per 24 hour  Intake 990 ml  Output 2050 ml  Net -1060 ml   Filed Weights   10/05/17 0548 10/06/17 0600 10/07/17 0441  Weight: 73.2 kg (161 lb 4.8 oz) 73.1 kg (161 lb 3.2 oz) 70.9 kg (156 lb 6.4 oz)    Examination:  General exam: Appears calm and comfortable on 3 lit of [place] oxygen.  Respiratory system: good air entry fair, no wheezing or rhonchi.  Cardiovascular system: S1 & S2 heard, RRR. No JVD, murmurs, Gastrointestinal system: Abdomen is soft non tender non distended bowel sounds good.  Central nervous system: Alert and oriented. Non focal.  Extremities: deformed right foot with multiple ulcerations.  Skin: No rashes, lesions or ulcers Psychiatry:  Mood & affect appropriate.     Data Reviewed: I have personally reviewed following  labs and imaging studies  CBC: Recent Labs  Lab 10/04/17 0057 10/04/17 0159 10/04/17 0538 10/05/17 1037 10/06/17 0951  WBC 8.4  --  7.1 7.2  --   NEUTROABS 6.3  --  4.5  --   --   HGB 8.7* 8.8* 8.2* 7.4* 9.0*  HCT 30.1* 26.0* 27.8* 25.4* 29.7*  MCV 84.8  --  85.5 86.1  --   PLT 285  --  290 252  --    Basic Metabolic Panel: Recent Labs  Lab 10/04/17 0159 10/04/17 0538 10/06/17 0356 10/07/17 0609  NA 143 142 140 141  K 5.3* 4.8 4.5 4.1  CL 115* 112* 111 112*  CO2  --  21* 23 21*  GLUCOSE 172* 138* 148* 62*  BUN 46* 43* 45* 42*  CREATININE 1.80* 1.94* 1.89* 1.71*  CALCIUM  --  8.8* 8.5* 8.7*  MG  --  2.1  --   --    GFR: Estimated Creatinine Clearance: 26.6 mL/min (A) (by C-G formula based on SCr of 1.71 mg/dL (H)). Liver Function Tests: Recent Labs  Lab 10/04/17 0538  AST 29  ALT 22  ALKPHOS 100  BILITOT 0.4  PROT 6.5  ALBUMIN 2.7*   No results for input(s): LIPASE, AMYLASE in the last 168 hours. No results for input(s): AMMONIA in the last 168 hours. Coagulation Profile: No results for input(s): INR, PROTIME in the last 168 hours. Cardiac Enzymes: Recent Labs  Lab 10/04/17 0538 10/04/17 1351 10/04/17 1620  TROPONINI 0.06* 0.05* 0.05*   BNP (last 3 results) No results for input(s): PROBNP in the last 8760 hours. HbA1C: No results for input(s): HGBA1C in the last 72 hours. CBG: Recent Labs  Lab 10/06/17 1629 10/06/17 2110 10/07/17 0729 10/07/17 0909 10/07/17 1156  GLUCAP 210* 129* 57* 137* 168*   Lipid Profile: No results for input(s): CHOL, HDL, LDLCALC, TRIG, CHOLHDL, LDLDIRECT in the last 72 hours. Thyroid Function Tests: Recent Labs    10/06/17 0951  FREET4 1.02   Anemia Panel: No results for input(s): VITAMINB12, FOLATE, FERRITIN, TIBC, IRON, RETICCTPCT in the last 72 hours. Sepsis Labs: No results for input(s): PROCALCITON, LATICACIDVEN in the last 168 hours.  No results found for this or any previous visit (from the past 240  hour(s)).       Radiology Studies: No results found.      Scheduled Meds: . aspirin EC  81 mg Oral Daily  . atorvastatin  40 mg Oral q1800  . carvedilol  9.375 mg Oral BID WC  . clopidogrel  75 mg Oral Q breakfast  . cyclobenzaprine  10 mg Oral TID  . enoxaparin (LOVENOX) injection  30 mg Subcutaneous Daily  . furosemide  40 mg Intravenous Q12H  . hydrALAZINE  25 mg Oral Q8H  . insulin aspart  0-9 Units Subcutaneous TID WC  . insulin  glargine  20 Units Subcutaneous QHS  . isosorbide mononitrate  30 mg Oral Daily  . linagliptin  5 mg Oral Daily  . lisinopril  30 mg Oral Daily  . loratadine  10 mg Oral Daily  . pantoprazole  40 mg Oral Daily   Continuous Infusions:   LOS: 3 days    Time spent: 35 minutes.     Kathlen Mody, MD [facility] Pager [phone]  If 7PM-7AM, please contact night-coverage [email or web address] Password [facility] 10/07/2017, 3:07 PM

## 2017-10-07 NOTE — Evaluation (Signed)
Physical Therapy Evaluation Patient Details Name: Emily Miranda MRN: [id number] DOB: 20-Sep-1940 Today's Date: 10/07/2017   History of Present Illness  Pt is a 77 y/o female with a PMH significant for chronic combined systolic and diastolic CHF last EF measured in March 2018 was 40 to 45%, recent cardiac cath with stent placement, IDDM, CKDIII, hypertension and charcot foot on the right side plan to have right AKA at [facility] end of June 2019. Pt presents with sudden onset of SOB. She was found to be in acute respiratory failure with hypoxia. She initially required BiPAP and transitioned to [place] supplemental O2 2L/min.   Clinical Impression  Pt admitted with above diagnosis. Pt currently with functional limitations due to the deficits listed below (see PT Problem List). At the time of PT eval pt was able to perform transfers and ambulation with gross min guard assist to min assist for balance support and safety. Pt was on RA during transfers and sats dropped significantly (see below for details). Overall strength and tolerance for functional activity are low at this time. Pt will benefit from skilled PT to increase their independence and safety with mobility to allow discharge to the venue listed below.      SATURATION QUALIFICATIONS: (This note is used to comply with regulatory documentation for home oxygen)  Patient Saturations on Room Air at Rest = 86%  Patient Saturations on Room Air while Ambulating = 86%  Patient Saturations on 2 Liters of oxygen while at rest = 98%  Please briefly explain why patient needs home oxygen: Pt is not able to maintain O2 sats >86% on RA during basic functional mobility tasks.    Follow Up Recommendations Home health PT;Supervision/Assistance - 24 hour    Equipment Recommendations  None recommended by PT    Recommendations for Other Services       Precautions / Restrictions Precautions Precautions: Fall Restrictions Weight Bearing Restrictions: No       Mobility  Bed Mobility Overal bed mobility: Needs Assistance Bed Mobility: Supine to Sit     Supine to sit: Supervision     General bed mobility comments: HOB elevated. Use of rails required. No assist but increased time to fully transition to EOB.   Transfers Overall transfer level: Needs assistance Equipment used: None Transfers: Sit to/from [employer] Sit to Stand: Min guard Stand pivot transfers: Min guard;Min assist       General transfer comment: Hands-on guarding with gait belt as pt powered up to full stand. She was able to take a few pivotal steps around to the chair and assist was provided for controlled descent to the chair.   Ambulation/Gait             General Gait Details: Not tested. Pt reports she is nonambulatory at baseline and only transfers to/from the wheelchair.   Stairs            Wheelchair Mobility    Modified Rankin (Stroke Patients Only)       Balance Overall balance assessment: Needs assistance Sitting-balance support: Feet supported;No upper extremity supported Sitting balance-Leahy Scale: Fair     Standing balance support: Bilateral upper extremity supported;During functional activity Standing balance-Leahy Scale: Poor Standing balance comment: Pt reaching for outside support with dynamic standing activity.                              Pertinent Vitals/Pain Pain Assessment: 0-10 Pain Score: 8  Pain Location: lower abdominal pain and bilateral lower extremity pain Pain Descriptors / Indicators: Cramping((abdomen)) Pain Intervention(s): Limited activity within patient's tolerance;Monitored during session;Repositioned    Home Living Family/patient expects to be discharged to:: Private residence Living Arrangements: Spouse/significant other;Children Available Help at Discharge: Family;Available 24 hours/day Type of Home: Apartment Home Access: Level entry     Home Layout: One level Home  Equipment: Walker - 2 wheels;Wheelchair - manual;Grab bars - toilet      Prior Function Level of Independence: Needs assistance   Gait / Transfers Assistance Needed: Has been in the wheelchair for mobility. Reports several falls and husband holds on to her for support during transfers.   ADL's / Homemaking Assistance Needed: Husband assists with ADL's. Has been washing up at the sink instead of getting into the shower. States she has tried a tub bench in the past and has not been able to get her feet over the edge to shower that way. Can do upper body washing and dressing and husband assists mostly with lower body washing/dressing.         Hand Dominance   Dominant Hand: Right    Extremity/Trunk Assessment   Upper Extremity Assessment Upper Extremity Assessment: Overall WFL for tasks assessed    Lower Extremity Assessment Lower Extremity Assessment: RLE deficits/detail;LLE deficits/detail RLE Deficits / Details: Charcot foot wrapped in kerlix to cover wounds (cellulitis). Decreased strength and AROM overall.  RLE Sensation: (Hypersensitive to touch) LLE Deficits / Details: Foot also wrapped in kerlix due to wounds (cellulitis). Grossly externally rotated at the ankle LLE Sensation: (Hypersensitive to touch)    Cervical / Trunk Assessment Cervical / Trunk Assessment: Kyphotic  Communication   Communication: No difficulties  Cognition Arousal/Alertness: Awake/alert Behavior During Therapy: WFL for tasks assessed/performed Overall Cognitive Status: Within Functional Limits for tasks assessed                                        General Comments      Exercises     Assessment/Plan    PT Assessment Patient needs continued PT services  PT Problem List Decreased strength;Decreased range of motion;Decreased activity tolerance;Decreased balance;Decreased mobility;Decreased knowledge of use of DME;Decreased safety awareness;Decreased knowledge of  precautions;Pain;Cardiopulmonary status limiting activity       PT Treatment Interventions DME instruction;Gait training;Stair training;Functional mobility training;Therapeutic activities;Therapeutic exercise;Neuromuscular re-education;Patient/family education    PT Goals (Current goals can be found in the Care Plan section)  Acute Rehab PT Goals Patient Stated Goal: Return home at d/c. PT Goal Formulation: With patient Time For Goal Achievement: 10/21/17 Potential to Achieve Goals: Good    Frequency Min 3X/week   Barriers to discharge        Co-evaluation               AM-PAC PT "6 Clicks" Daily Activity  Outcome Measure Difficulty turning over in bed (including adjusting bedclothes, sheets and blankets)?: None Difficulty moving from lying on back to sitting on the side of the bed? : A Little Difficulty sitting down on and standing up from a chair with arms (e.g., wheelchair, bedside commode, etc,.)?: A Little Help needed moving to and from a bed to chair (including a wheelchair)?: A Little Help needed walking in hospital room?: Total Help needed climbing 3-5 steps with a railing? : Total 6 Click Score: 15    End of Session Equipment Utilized During Treatment: Gait belt;Oxygen  Activity Tolerance: Patient tolerated treatment well Patient left: in chair;with call bell/phone within reach;with chair alarm set Nurse Communication: Mobility status;Other (comment)(O2 status) PT Visit Diagnosis: Unsteadiness on feet (R26.81);Pain;Muscle weakness (generalized) (M62.81);Repeated falls (R29.6);Difficulty in walking, not elsewhere classified (R26.2) Pain - part of body: Leg    Time: 9604-5409 PT Time Calculation (min) (ACUTE ONLY): 34 min   Charges:   PT Evaluation $PT Eval Moderate Complexity: 1 Mod PT Treatments $Therapeutic Activity: 8-22 mins   PT G Codes:        Conni Slipper, PT, DPT Acute Rehabilitation Services Pager: [phone]   Marylynn Pearson 10/07/2017,  12:01 PM

## 2017-10-07 NOTE — Care Management Important Message (Signed)
Important Message  Patient Details  Name: Emily Miranda MRN: [id number] Date of Birth: [birth year]   Medicare Important Message Given:  Yes    Maleta Pacha P Loucile Posner 10/07/2017, 3:03 PM

## 2017-10-07 NOTE — Progress Notes (Signed)
  Echocardiogram 2D Echocardiogram has been performed.  Emily Miranda 10/07/2017, 10:08 AM

## 2017-10-08 DIAGNOSIS — E11610 Type 2 diabetes mellitus with diabetic neuropathic arthropathy: Secondary | ICD-10-CM

## 2017-10-08 DIAGNOSIS — I739 Peripheral vascular disease, unspecified: Secondary | ICD-10-CM

## 2017-10-08 DIAGNOSIS — I420 Dilated cardiomyopathy: Secondary | ICD-10-CM

## 2017-10-08 DIAGNOSIS — I350 Nonrheumatic aortic (valve) stenosis: Secondary | ICD-10-CM

## 2017-10-08 DIAGNOSIS — I2510 Atherosclerotic heart disease of native coronary artery without angina pectoris: Secondary | ICD-10-CM

## 2017-10-08 DIAGNOSIS — I5043 Acute on chronic combined systolic (congestive) and diastolic (congestive) heart failure: Secondary | ICD-10-CM

## 2017-10-08 DIAGNOSIS — Z9861 Coronary angioplasty status: Secondary | ICD-10-CM

## 2017-10-08 LAB — BASIC METABOLIC PANEL
Anion gap: 10 (ref 5–15)
BUN: 42 mg/dL — ABNORMAL HIGH (ref 6–20)
CO2: 26 mmol/L (ref 22–32)
Calcium: 8.7 mg/dL — ABNORMAL LOW (ref 8.9–10.3)
Chloride: 104 mmol/L (ref 101–111)
Creatinine, Ser: 1.77 mg/dL — ABNORMAL HIGH (ref 0.44–1.00)
GFR calc Af Amer: 31 mL/min — ABNORMAL LOW (ref >60)
GFR calc non Af Amer: 27 mL/min — ABNORMAL LOW (ref >60)
Glucose, Bld: 134 mg/dL — ABNORMAL HIGH (ref 65–99)
Potassium: 3.8 mmol/L (ref 3.5–5.1)
Sodium: 140 mmol/L (ref 135–145)

## 2017-10-08 LAB — CBC
HCT: 30 % — ABNORMAL LOW (ref 36.0–46.0)
Hemoglobin: 9 g/dL — ABNORMAL LOW (ref 12.0–15.0)
MCH: 25.4 pg — ABNORMAL LOW (ref 26.0–34.0)
MCHC: 30 g/dL (ref 30.0–36.0)
MCV: 84.7 fL (ref 78.0–100.0)
Platelets: 259 10*3/uL (ref 150–400)
RBC: 3.54 MIL/uL — ABNORMAL LOW (ref 3.87–5.11)
RDW: 16 % — ABNORMAL HIGH (ref 11.5–15.5)
WBC: 7.7 10*3/uL (ref 4.0–10.5)

## 2017-10-08 LAB — GLUCOSE, CAPILLARY
Glucose-Capillary: 116 mg/dL — ABNORMAL HIGH (ref 65–99)
Glucose-Capillary: 197 mg/dL — ABNORMAL HIGH (ref 65–99)
Glucose-Capillary: 260 mg/dL — ABNORMAL HIGH (ref 65–99)
Glucose-Capillary: 278 mg/dL — ABNORMAL HIGH (ref 65–99)

## 2017-10-08 LAB — D-DIMER, QUANTITATIVE: D-Dimer, Quant: 3.09 ug/mL-FEU — ABNORMAL HIGH (ref 0.00–0.50)

## 2017-10-08 MED ORDER — SODIUM CHLORIDE 0.9% FLUSH
3.0000 mL | Freq: Two times a day (BID) | INTRAVENOUS | Status: DC
  Administered 2017-10-08: 3 mL via INTRAVENOUS

## 2017-10-08 MED ORDER — CARVEDILOL 12.5 MG PO TABS
12.5000 mg | ORAL_TABLET | Freq: Two times a day (BID) | ORAL | Status: DC
Start: 2017-10-08 — End: 2017-10-10
  Administered 2017-10-08 – 2017-10-10 (×3): 12.5 mg via ORAL
  Filled 2017-10-08 (×3): qty 1

## 2017-10-08 MED ORDER — SODIUM CHLORIDE 0.9 % IV SOLN
INTRAVENOUS | Status: DC
  Administered 2017-10-09: 05:00:00 via INTRAVENOUS

## 2017-10-08 MED ORDER — ASPIRIN 81 MG PO CHEW
81.0000 mg | CHEWABLE_TABLET | ORAL | Status: AC
  Administered 2017-10-09: 81 mg via ORAL
  Filled 2017-10-08: qty 1

## 2017-10-08 MED ORDER — SODIUM CHLORIDE 0.9% FLUSH: 3.0000 mL | INTRAVENOUS | Status: DC | PRN

## 2017-10-08 MED ORDER — SODIUM CHLORIDE 0.9 % IV SOLN: 250.0000 mL | INTRAVENOUS | Status: DC | PRN

## 2017-10-08 NOTE — Consult Note (Addendum)
Cardiology Consultation:   Patient ID: Emily Miranda; [id number]; 1941-04-27   Admit date: 10/04/2017 Date of Consult: 10/08/2017  Primary Care Provider: Noni Saupe, MD Primary Cardiologist: Dr. Rollene Rotunda   Patient Profile:   Emily Miranda is a 77 y.o. female with a hx of chronic combined diastolic and systolic CHF LVEF 40 to 45% with mild to moderate aortic stenosis (2018), CAD with prior DES to RCA(10/2016) and LAD and LFX (07/2017), CKD stage III, DM 2, right foot charcot's deformity (plans for AKA at [facility]) and anemia who is being seen today for the evaluation of acute on chronic CHF and worsening aortic stenosis per echo at the request of Dr. Jarvis Newcomer.  History of Present Illness:   [name] is a 77 year old female with a history stated above who presented to [facility] ED on 10/04/2017 with sudden onset of shortness of breath x1 day.  Although she suffers from chronic dyspnea, she reported that this was acutely worse causing much distress. Given her symptoms she proceeded to the ED for further evaluation.  She denied chest pain on admission however, today she reports that she had "crushing" mid left breast chest pain this morning (rated a 5/10 severity) lasting from 6 AM to 7 AM which dissipated on its own.  She states the pain did not radiate however, she does report back pain since admission. She denies notifying her RN or any other family or staff member.  She reports no associated symptoms at that time.  She states that she has intermittent chest pain which waxes and wanes at times along with her shortness of breath since her last stent placement in March 2019. She denies productive cough, fever or chills.  No reports of worsening LE swelling, orthopnea, dizziness or syncope.   In the ED, BPs were noted to be mildly elevated and patient was hypoxic.  She was placed on BiPAP ventilation and Lasix 40 mg IV was given.  She was eventually weaned off of BiPAP and stable.  EKG  revealed normal sinus rhythm with no acute ischemic ST-T wave changes.  Troponin level were noted to be 0.06>0.05>0.05.  Her BNP was markedly elevated at 4300.  Chest x-ray was completed which showed cardiomegaly with vascular congestion, mild edema and small pleural effusions with low lung volumes.  She was admitted to internal medicine service and cardiology has now been consulted despite adequate diuresis with IV Lasix with persistent mild hypoxia and an echocardiogram with stable LVEF at 45 to 50%, basal mid inferoseptal hypokinesis, G1 DD, severe RAE, mild LAE and dilated/blunted IVF as well as advancement of aortic stenosis.   Of note, she underwent a cardiac cath 10/11/2016 which showed moderate, diffuse three-vessel disease.  The plan was for continued medical therapy.  She was once again admitted 11/04/2016 with chest pain and positive troponin.  On 11/05/2016 she underwent intervention to 2 sites and RCA with DES by Dr. Tresa Endo.  She had chest pain post PCI but her troponin was flat and no further intervention was planned.  She was noted to be volume overloaded in follow-up in July and Zaroxolyn 2.5 mg 3 times a week was added.  In January 2019 she was admitted with AKI and her diuretics were backed off.    She has been followed at [facility] and was admitted 08/01/2017 and had 2 additional DES stents placed.  She has been seen by an orthopedist there and apparently she needs a right AKA, planned for next week.  She was referred to see [facility] for preoperative clearance 08/28/2017.  At that time, she had complaints of swelling in her thighs and abdomen.  Her weight was reported at 160lb the last office weight in January 2019 at 142lb. the decision was made to admit her for IV diuresis at that time. She was discharged on 09/04/2017 and has not been seen in our office since.  Cardiology has been consulted given her complicated heart failure situation and worsening AS.   Past Medical History:  Diagnosis Date  . AKI  (acute kidney injury) (HCC)   . Aortic stenosis, mild   . CAD (coronary artery disease)    a. 09/2016 NSTEMI/Cath: RCA 70p/m, RPDA 95ost-->Med Rx;  b. 10/2016 PCI: RCA 70p/m (2.25 x 15 Resolute Onyx DES), RPDA 95ost (2.0 x 12 Resolute Onyx DES).  . Charcot foot due to diabetes mellitus (HCC)   . Chronic combined systolic (congestive) and diastolic (congestive) heart failure (HCC)    a. 07/2016 Echo: EF 40-45%, Gr2 DD, mild AS, triv AI, mild MR, mod dil LA.  . Diabetic peripheral neuropathy (HCC)   . Heart murmur   . History of blood transfusion    "related to low HgB"  . History of kidney stones   . History of stomach ulcers   . HTN (hypertension)   . Hyperlipidemia   . Migraine    "maybe once/month" (08/28/2017)  . Myocardial infarction (HCC) 07/2017   while at [facility], s/p cath with DES LAD and CFX  . NSTEMI (non-ST elevated myocardial infarction) (HCC) 10/06/2016  . Pancreatitis    "my dad died w/it at 11 yr old"  . Physical deconditioning    a. W/C bound.  . Pneumonia 10/2016  . Stasis dermatitis of both legs 05/2017  . Type II diabetes mellitus (HCC)     Past Surgical History:  Procedure Laterality Date  . ABDOMINAL HYSTERECTOMY    . APPENDECTOMY    . CARPAL TUNNEL RELEASE Bilateral   . CATARACT EXTRACTION W/ INTRAOCULAR LENS  IMPLANT, BILATERAL Bilateral   . CORONARY ANGIOPLASTY WITH STENT PLACEMENT  07/2017   "@ [facility]"  . CORONARY STENT INTERVENTION N/A 11/05/2016   Procedure: Coronary Stent Intervention;  Surgeon: Lennette Bihari, MD;  Location: [facility] INVASIVE CV LAB;  Service: Cardiovascular;  Laterality: N/A;  . DILATION AND CURETTAGE OF UTERUS    . EXCISIONAL HEMORRHOIDECTOMY    . FOOT SURGERY Right    "ulcer"  . GLAUCOMA SURGERY Bilateral   . LAPAROSCOPIC INCISIONAL / UMBILICAL / VENTRAL HERNIA REPAIR  01/04/2008   Dr Bertram Savin  . LAPAROSCOPIC LYSIS OF ADHESIONS  2007   Dr Donovan Kail  . LEFT HEART CATH AND CORONARY ANGIOGRAPHY N/A 10/11/2016   Procedure: Left Heart  Cath and Coronary Angiography;  Surgeon: Yvonne Kendall, MD;  Location: [facility] INVASIVE CV LAB;  Service: Cardiovascular;  Laterality: N/A;  . OVARIAN CYST REMOVAL    . ROBOT ASSISTED PYELOPLASTY  02/2007   Dr Laverle Patter  . SUTURE REMOVAL  08/17/2009   Dr Bertram Savin .  Right paramedian GoreTex stitch  . TONSILLECTOMY       Prior to Admission medications   Medication Sig Start Date End Date Taking? Authorizing Provider  Ascorbic Acid (VITAMIN C) 1000 MG tablet Take 1,000 mg by mouth daily.   Yes [provider]  aspirin EC 81 MG tablet Take 1 tablet (81 mg total) by mouth daily. 09/04/17 09/04/18 Yes Berton Bon, NP  atorvastatin (LIPITOR) 40 MG tablet Take 1 tablet (  40 mg total) by mouth daily at 6 PM. 08/05/16  Yes Garth Bigness, MD  carvedilol (COREG) 3.125 MG tablet Take 3 tablets (9.375 mg total) by mouth 2 (two) times daily with a meal. 09/04/17  Yes Berton Bon, NP  clopidogrel (PLAVIX) 75 MG tablet Take 1 tablet (75 mg total) by mouth daily with breakfast. 11/10/16  Yes Creig Hines, NP  cyclobenzaprine (FLEXERIL) 10 MG tablet Take 10 mg by mouth 3 (three) times daily.  10/16/16  Yes [provider]  fexofenadine (ALLEGRA) 180 MG tablet Take 180 mg by mouth daily.   Yes [provider]  fluticasone (FLONASE) 50 MCG/ACT nasal spray Place 1 spray into both nostrils daily as needed (seasonal allergies).  02/05/16  Yes [provider]  furosemide (LASIX) 40 MG tablet Take 1 tablet (40 mg total) by mouth daily. 09/05/17  Yes Berton Bon, NP  glimepiride (AMARYL) 4 MG tablet Take 2-4 mg by mouth See admin instructions. Take 1 tablet by mouth every morning, and 1/2 tablet every evening 09/15/17  Yes [provider]  HUMALOG KWIKPEN 100 UNIT/ML KiwkPen Inject 2 Units into the skin daily with supper. 08/26/17  Yes [provider]  hydrALAZINE (APRESOLINE) 25 MG tablet Take 25 mg by mouth every 8 (eight) hours.   Yes [provider]  insulin glargine (LANTUS) 100 UNIT/ML injection Inject 0.12 mLs (12 Units total) into the skin at bedtime. Patient taking differently: Inject 40 Units into the skin at bedtime.  09/04/17  Yes Berton Bon, NP  isosorbide mononitrate (IMDUR) 30 MG 24 hr tablet Take 1 tablet (30 mg total) by mouth daily. 11/09/16  Yes Creig Hines, NP  linagliptin (TRADJENTA) 5 MG TABS tablet Take 5 mg by mouth daily.   Yes [provider]  lisinopril (PRINIVIL,ZESTRIL) 30 MG tablet Take 30 mg by mouth daily.   Yes [provider]  nitroGLYCERIN (NITROSTAT) 0.4 MG SL tablet Place 1 tablet (0.4 mg total) under the tongue every 5 (five) minutes x 3 doses as needed for chest pain. 11/09/16  Yes Creig Hines, NP  pantoprazole (PROTONIX) 40 MG tablet Take 1 tablet (40 mg total) by mouth daily. 07/04/17  Yes Rollene Rotunda, MD  Polyethyl Glycol-Propyl Glycol (SYSTANE OP) Place 1 drop into both eyes daily as needed (dry eyes).   Yes [provider]  polyethylene glycol (MIRALAX / GLYCOLAX) packet Take 17 g by mouth daily as needed (constipation). Mix in 8 oz liquid and drink   Yes [provider]  collagenase (SANTYL) ointment Apply topically daily. Apply to LE plantar surface daily. Patient not taking: Reported on 10/04/2017 06/04/17   Arrien, York Ram, MD  diclofenac sodium (VOLTAREN) 1 % GEL Apply 2 g topically 4 (four) times daily. Please apply to let hip. Patient not taking: Reported on 10/04/2017 06/03/17   Arrien, York Ram, MD  HYDROcodone-acetaminophen (NORCO/VICODIN) 5-325 MG tablet Take 1 tablet by mouth every 6 (six) hours as needed for moderate pain. Patient not taking: Reported on 08/29/2017 06/03/17   Arrien, York Ram, MD  mupirocin cream (BACTROBAN) 2 % Apply 1 application topically 2 (two) times daily. Patient not taking: Reported on 10/04/2017 05/23/17   Asencion Islam, DPM  silver sulfADIAZINE (SILVADENE) 1 % cream  Apply topically daily. Apply to right lower extremities affected areas, not to the plantar region Patient not taking: Reported on 10/04/2017 06/04/17   Arrien, York Ram, MD    Inpatient Medications: Scheduled Meds: . aspirin EC  81 mg  Oral Daily  . atorvastatin  40 mg Oral q1800  . carvedilol  9.375 mg Oral BID WC  . clopidogrel  75 mg Oral Q breakfast  . cyclobenzaprine  10 mg Oral TID  . enoxaparin (LOVENOX) injection  30 mg Subcutaneous Daily  . furosemide  40 mg Intravenous Q12H  . hydrALAZINE  25 mg Oral Q8H  . insulin aspart  0-9 Units Subcutaneous TID WC  . insulin glargine  20 Units Subcutaneous QHS  . isosorbide mononitrate  30 mg Oral Daily  . linagliptin  5 mg Oral Daily  . lisinopril  30 mg Oral Daily  . loratadine  10 mg Oral Daily  . pantoprazole  40 mg Oral Daily   Continuous Infusions:  PRN Meds: acetaminophen **OR** acetaminophen, fluticasone, nitroGLYCERIN, ondansetron **OR** ondansetron (ZOFRAN) IV, polyethylene glycol, traMADol  Allergies:    Allergies  Allergen Reactions  . Levofloxacin Nausea And Vomiting and Other (See Comments)    Confusion  . Amlodipine Other (See Comments)    Localized edema  . Gabapentin Other (See Comments)    Disoriented, no strength in legs  . Hydrochlorothiazide Other (See Comments)    Hot and disoriented  . Ibuprofen Other (See Comments)    Affected kidneys - stopped flow of urine Reaction to Advil  . Metformin And Related Other (See Comments)    Affected kidneys - stopped flow of urine  . Simvastatin Other (See Comments)    Chest pain    Social History:   Social History   Socioeconomic History  . Marital status: Married    Spouse name: Not on file  . Number of children: Not on file  . Years of education: Not on file  . Highest education level: Not on file  Occupational History  . Not on file  Social Needs  . Financial resource strain: Not on file  . Food insecurity:    Worry: Not on file    Inability:  Not on file  . Transportation needs:    Medical: Not on file    Non-medical: Not on file  Tobacco Use  . Smoking status: Never Smoker  . Smokeless tobacco: Never Used  Substance and Sexual Activity  . Alcohol use: No  . Drug use: No  . Sexual activity: Not on file  Lifestyle  . Physical activity:    Days per week: Not on file    Minutes per session: Not on file  . Stress: Not on file  Relationships  . Social connections:    Talks on phone: Not on file    Gets together: Not on file    Attends religious service: Not on file    Active member of club or organization: Not on file    Attends meetings of clubs or organizations: Not on file    Relationship status: Not on file  . Intimate partner violence:    Fear of current or ex partner: Not on file    Emotionally abused: Not on file    Physically abused: Not on file    Forced sexual activity: Not on file  Other Topics Concern  . Not on file  Social History Narrative  . Not on file    Family History:   Family History  Problem Relation Age of Onset  . Chronic Renal Failure Mother   . Diabetes Father   . Heart attack Father 52   Family Status:  Family Status  Relation Name Status  . Mother  Deceased  . Father  Deceased  . MGM  Deceased  . MGF  Deceased  . PGM  Deceased  . PGF  Deceased   ROS:  Please see the history of present illness.  All other ROS reviewed and negative.     Physical Exam/Data:   Vitals:   10/07/17 0441 10/07/17 1157 10/07/17 2024 10/08/17 0555  BP: (!) 178/77 (!) 157/65 (!) 162/82 (!) 165/71  Pulse: 74 72 68 80  Resp: 18  18 18   Temp: 98.3 F (36.8 C)   98.2 F (36.8 C)  TempSrc: Oral   Oral  SpO2: 100% 98% 100% 97%  Weight: 156 lb 6.4 oz (70.9 kg)   150 lb 14.4 oz (68.4 kg)  Height:        Intake/Output Summary (Last 24 hours) at 10/08/2017 1112 Last data filed at 10/08/2017 0911 Gross per 24 hour  Intake 600 ml  Output 2025 ml  Net -1425 ml   Filed Weights   10/06/17 0600  10/07/17 0441 10/08/17 0555  Weight: 161 lb 3.2 oz (73.1 kg) 156 lb 6.4 oz (70.9 kg) 150 lb 14.4 oz (68.4 kg)   Body mass index is 24.36 kg/m.   General: Frail, elderly, NAD Skin: Warm, dry, intact  Head: Normocephalic, atraumatic, clear, moist mucus membranes. Neck: Negative for carotid bruits. No JVD Lungs:Clear to ausculation bilaterally. No wheezes, rales, or rhonchi. Breathing is unlabored. Cardiovascular: RRR with S1 S2. + murmurs. No rubs or gallops Abdomen: Soft, non-tender, non-distended with normoactive bowel sounds. No obvious abdominal masses. MSK: Strength and tone appear normal for age. 5/5 in all extremities Extremities: Mild 1+ BLE edema.  Right LE deformity  DP/PT pulses 1+ bilaterally Neuro: Alert and oriented. No focal deficits. No facial asymmetry. MAE spontaneously. Psych: Responds to questions appropriately with normal affect.     EKG:  The EKG was personally reviewed and demonstrates: 10/04/2017 NSR with first-degree AV block, HR 91 Telemetry:  Telemetry was personally reviewed and demonstrates: 10/08/2017 NSR with first-degree AV block  Relevant CV Studies:  ECHO: Echocardiogram 10/07/2017: Study Conclusions  - Left ventricle: The cavity size was normal. There was mild   concentric hypertrophy. Systolic function was mildly reduced. The   estimated ejection fraction was in the range of 45% to 50%.   Hypokinesis of the basal-midinferoseptal myocardium. Doppler   parameters are consistent with abnormal left ventricular   relaxation (grade 1 diastolic dysfunction). Doppler parameters   are consistent with high ventricular filling pressure. - Aortic valve: There was moderate stenosis. There was moderate   regurgitation. Peak velocity (S): 365 cm/s. Mean gradient (S): 26   mm Hg. Valve area (VTI): 0.85 cm^2. Valve area (Vmax): 0.84 cm^2.   Valve area (Vmean): 0.81 cm^2. Regurgitation pressure half-time:   379 ms. - Mitral valve: Transvalvular velocity was within  the normal range.   There was no evidence for stenosis. There was mild regurgitation. - Left atrium: The atrium was mildly dilated. - Right ventricle: The cavity size was normal. Wall thickness was   normal. Systolic function was normal. - Right atrium: The atrium was severely dilated. - Tricuspid valve: There was mild regurgitation. - Pulmonary arteries: Systolic pressure was severely increased. PA   peak pressure: 60 mm Hg (S).  Echocardiogram 07/30/2016: Study Conclusions  - Left ventricle: The cavity size was normal. Wall thickness was   normal. Systolic function was mildly to moderately reduced. The   estimated ejection fraction was in the range of 40% to 45%.   Akinesis of the midanteroseptal myocardium.  Features are   consistent with a pseudonormal left ventricular filling pattern,   with concomitant abnormal relaxation and increased filling   pressure (grade 2 diastolic dysfunction). - Aortic valve: Mildly to moderately calcified annulus. Mildly   thickened, mildly calcified leaflets. There was mild stenosis.   There was trivial regurgitation. Valve area (VTI): 1.31 cm^2.   Valve area (Vmax): 1.28 cm^2. Valve area (Vmean): 1.26 cm^2. - Mitral valve: There was mild regurgitation. - Left atrium: The atrium was moderately dilated. - Pericardium, extracardiac: A trivial pericardial effusion was   identified. There was a left pleural effusion.  CATH:  Cardiac catheterization 10/11/2016: Conclusions: 1. Three vessel coronary artery disease, including diffuse LAD disease with sequential 50% lesions, sequential 70% and 60% mid LCx stenosis, 50% mid RCA lesion, and 90% stenosis in midportion of small rPDA. 2. Mildly elevated left ventricular filling pressure. 3. Moderately elevated aortic valve gradient (peak-to-peak gradient 25 mmHg).  Recommendations: 1. Medical therapy given improvement of chest pain and shortness with blood transfusion and diuresis. 2. If symptoms worsen  despite stable hemoglobin and optimized volume status, FFR/PCI to the LCx could be performed. I would defer PCI to PDA given its small size.  Cardiac catheterization 11/05/2016 with PCI:  Mid RCA lesion, 50 %stenosed.  A STENT RESOLUTE ONYX 2.0X12 drug eluting stent was successfully placed.  Ost RPDA to RPDA lesion, 95 %stenosed.  Post intervention, there is a 0% residual stenosis.  A STENT RESOLUTE ONYX 2.25X15 drug eluting stent was successfully placed.  Prox RCA to Mid RCA lesion, 70 %stenosed.  Post intervention, there is a 0% residual stenosis.  Dist RCA lesion, 40 %stenosed.   Successful percutaneous coronary intervention to a 95% proximal PDA stenosis and a small caliber vessel, treated with a Wol[place] 2.010 mm Cutting Balloon and DES stenting with a 2.012 mm Resolute Onyx stent postdilated to 2.1 mm with the 95% stenosis being reduced to 0%.  Successful PCI to the mid RCA with Cutting Balloon atherotomy and DES stenting with a 2.2515 mm Resolute Onyx DES stent postdilated with a 2.5 noncompliant balloon and the tandem stenoses being reduced to 0%.  Laboratory Data:  Chemistry Recent Labs  Lab 10/06/17 0356 10/07/17 0609 10/08/17 0631  NA 140 141 140  K 4.5 4.1 3.8  CL 111 112* 104  CO2 23 21* 26  GLUCOSE 148* 62* 134*  BUN 45* 42* 42*  CREATININE 1.89* 1.71* 1.77*  CALCIUM 8.5* 8.7* 8.7*  GFRNONAA 25* 28* 27*  GFRAA 29* 33* 31*  ANIONGAP 6 8 10     Total Protein  Date Value Ref Range Status  10/04/2017 6.5 6.5 - 8.1 g/dL Final   Albumin  Date Value Ref Range Status  10/04/2017 2.7 (L) 3.5 - 5.0 g/dL Final   AST  Date Value Ref Range Status  10/04/2017 29 15 - 41 U/L Final   ALT  Date Value Ref Range Status  10/04/2017 22 14 - 54 U/L Final   Alkaline Phosphatase  Date Value Ref Range Status  10/04/2017 100 38 - 126 U/L Final   Total Bilirubin  Date Value Ref Range Status  10/04/2017 0.4 0.3 - 1.2 mg/dL Final   Hematology Recent Labs  Lab  10/04/17 0538 10/05/17 1037 10/06/17 0951 10/08/17 0631  WBC 7.1 7.2  --  7.7  RBC 3.25* 2.95*  --  3.54*  HGB 8.2* 7.4* 9.0* 9.0*  HCT 27.8* 25.4* 29.7* 30.0*  MCV 85.5 86.1  --  84.7  MCH 25.2* 25.1*  --  25.4*  MCHC 29.5* 29.1*  --  30.0  RDW 16.4* 16.5*  --  16.0*  PLT 290 252  --  259   Cardiac Enzymes Recent Labs  Lab 10/04/17 0538 10/04/17 1351 10/04/17 1620  TROPONINI 0.06* 0.05* 0.05*    Recent Labs  Lab 10/04/17 0105  TROPIPOC 0.03    BNP Recent Labs  Lab 10/04/17 0057  BNP 4,315.3*    DDimer No results for input(s): DDIMER in the last 168 hours. TSH:  Lab Results  Component Value Date   TSH 8.872 (H) 10/04/2017   Lipids: Lab Results  Component Value Date   CHOL 125 10/07/2016   HDL 34 (L) 10/07/2016   LDLCALC 72 10/07/2016   TRIG 97 10/07/2016   CHOLHDL 3.7 10/07/2016   HgbA1c: Lab Results  Component Value Date   HGBA1C 11.8 (H) 05/29/2017    Radiology/Studies:  No results found.  Assessment and Plan:   1.  Acute on chronic systolic and diastolic heart failure: -Echocardiogram 2018 with LVEF 40 to 45% and grade 2 DD.  She was initially managed at home with carvedilol, ACE-I, Imdur, hydralazine and Lasix 20 mg daily as needed -Echocardiogram from 10/07/2017, with stable LVEF at 45 to 50%, basal mid inferoseptal hypokinesis, G1 DD, severe RAE, mild LAE and dilated/blunted IVF as well as advancement of aortic stenosis. -Weight, 150lb today, 164lb admission 10/04/2017.  Last discharge weight on 09/04/2017 152lb -I&O, net -6.1 L, 24-hour total output 700 mL -Creatinine, 1.77 today, 1.71 yesterday> peak this admission 1.94 -Continue IV Lasix 40 mg twice daily  -Carvedilol 9.375 mg twice daily, hydralazine 25, Imdur 30, lisinopril 30 -Plan for a R/L heart cath to further evaluate AS versus heart failure etiology with further treatment based on results  -Will discuss plan further with MD  2.  Worsening aortic stenosis: -Advanced from last  echocardiogram with trivial regurgitation>>>Valve area (VTI): 1.31 cm^2. Valve area (Vmax): 1.28 cm^2. Valve area (Vmean): 1.26 cm^2VTI 1.31 cm2 > 0.85cm2. With moderate insufficiency.  -Echocardiogram from 10/07/2017 revealed mean gradient (S): 26 mm Hg. Valve area (VTI): 0.85 cm^2. Valve area (Vmax): 0.84 cm^2. Valve area (Vmean): 0.81 cm^2. -See plan above   3.  Acute on chronic CKD stage III: -Baseline creatinine approximately 1.5 -Creatinine,1.77 today, 1.71 yesterday> peak this admission 1.94 -During last hospitalization creatinine maintained at 1.86 diuresis, stable potassium  4.  Chest pain with hx of CAD s/p recent PCI with DES to proximal PDA/mid RCA/LAD (2018/2019): -Reports having crushing mid breast chest pain without radiation or associated symptoms. She had spontaneous resolution>>>reports being comfortable now>>no associated symptoms  -Trop, 0.06>0.05>0.05 -Continue ASA, Plavix, statin -Plan for R/L heart cath for further evaluation   5.  HTN: -Stable, 151/77, 165/71, 162/82 -Continue carvedilol, hydralazine 25, lisinopril 30  6.  PAD: -Severe deformity of right foot.  R [facility]A planned at [facility] next week  -WOC consulted, per primary team -Pt currently not surgical candidate for planned next week procedure given her current medical status   7.  DM2: -Uncontrolled, last hemoglobin A1c on 05/29/2017, 11.8  -Followed by diabetes coordinator with recommendations during last hospitalization -Currently on SSI and linagliptin per primary team   For questions or updates, please contact [facility] Please consult [email or web address] for contact info under Cardiology/STEMI.   SignedGeorgie Chard NP-C [facility] Pager: [phone] 10/08/2017 11:12 AM

## 2017-10-08 NOTE — Progress Notes (Signed)
PROGRESS NOTE  Emily Miranda  OVP:[id number] DOB: Nov 05, 1940 DOA: 10/04/2017 PCP: Noni Saupe, MD Brief Narrative: Emily Miranda is a 77 y.o. female with a history of chronic combined CHF, AS, CAD s/p PCI, T2DM, stage III CKD, anemia, HTN and right foot charcot's deformity (with plans for AKA at [facility]) who presented with 1 day of sbrupt onset dyspnea. She was found to be hypoxic requiring BiPAP, transitioned to nasal cannula. Diuresis was started with improvement, though she still requires oxygen despite weight being down below previous DC weight. Echocardiogram demonstrated stable EF 45-50%, basal midinferoseptal hypokinesis, G1DD, severe RAE, mild LAE, and dilated/blunted IVF as well as advancement of aortic stenosis.   Assessment & Plan: Principal Problem:   Acute on chronic combined systolic and diastolic HF (heart failure) (HCC) Active Problems:   DM type 2 causing CKD stage 3 (HCC)   PVD (peripheral vascular disease) (HCC)   CAD S/P percutaneous coronary angioplasty   Charcot foot due to diabetes mellitus (HCC)   CHF (congestive heart failure) (HCC)   Acute respiratory failure with hypoxia (HCC)   Pressure injury of skin  Acute hypoxic respiratory failure: Due to CHF exacerbation - Continue supplemental oxygen prn - Treat as below  Acute on chronic combined CHF:  - Continue lasix 40mg  IV BID for today, anticipate transition to po tomorrow. Cr stable, continue to monitor.  - Daily weights, I/O. Last DC weight was 152lbs, currently 150lbs but still hypoxic. Reports her EDW to be in the 140's. Will have cardiology weigh in.  - Continue coreg, lisinopril.   CAD s/p DES x2 June 2018 and DES March 2019: Troponin trend flat. No chest pain.  - Continue BB, ASA, plavix, statin  Aortic stenosis: Advanced to moderate in the past year based on March 2018 echo. VTI 1.31 cm2 > 0.85cm2. With moderate insufficiency.  - Will ask for cardiology input  Charcot deformity of right  foot with pressure ulcer:  - WOC consulted, following local wound care recommendations.  - Follow up with [facility] orthopedics, planning AKA pending cardiology clearance  IDT2DM:  - Reprts taking lantus 40u qHS, but became hypoglycemic with 30u administered, so decreased to 20u. Refused last night and has been at inpatient goal. Will DC lantus for now.  - Hold OSU - Continue linagliptin and SSI.   Anemia of chronic disease: Received 1u PRBCs earlier this admission.  - Monitor hgb, transfusion threshold felt to be 8g/dl. No elevation of bilirubin to indicate hemolysis.  Subclinical hypothyroidism: TSH 8.872, with normal free T4 1.02.  - Suggest recheck TFTs in 4-6 weeks.   Midline lower abdominal density on exam: ?Urinary retention.  - Post void residual today, d/w RN.   DVT prophylaxis: Lovenox Code Status: Full Family Communication: None at bedside Disposition Plan: Pending improvement in respiratory failure, cardiology evaluation  Consultants:   Cardiology  Procedures:   Echocardiogram 5/28:  - Left ventricle: The cavity size was normal. There was mild   concentric hypertrophy. Systolic function was mildly reduced. The   estimated ejection fraction was in the range of 45% to 50%.   Hypokinesis of the basal-midinferoseptal myocardium. Doppler   parameters are consistent with abnormal left ventricular   relaxation (grade 1 diastolic dysfunction). Doppler parameters   are consistent with high ventricular filling pressure. - Aortic valve: There was moderate stenosis. There was moderate   regurgitation. Peak velocity (S): 365 cm/s. Mean gradient (S): 26   mm Hg. Valve area (VTI): 0.85 cm^2. Valve area (Vmax):  0.84 cm^2.   Valve area (Vmean): 0.81 cm^2. Regurgitation pressure half-time:   379 ms. - Mitral valve: Transvalvular velocity was within the normal range.   There was no evidence for stenosis. There was mild regurgitation. - Left atrium: The atrium was mildly dilated. -  Right ventricle: The cavity size was normal. Wall thickness was   normal. Systolic function was normal. - Right atrium: The atrium was severely dilated. - Tricuspid valve: There was mild regurgitation. - Pulmonary arteries: Systolic pressure was severely increased. PA   peak pressure: 60 mm Hg (S).  Antimicrobials:  None   Subjective: Breathing is somewhat better from admission but no where near baseline. No chest pain. Feels there is still a lot of fluid in her abdomen but leg swelling has resolved.  Objective: Vitals:   10/07/17 0441 10/07/17 1157 10/07/17 2024 10/08/17 0555  BP: (!) 178/77 (!) 157/65 (!) 162/82 (!) 165/71  Pulse: 74 72 68 80  Resp: 18  18 18   Temp: 98.3 F (36.8 C)   98.2 F (36.8 C)  TempSrc: Oral   Oral  SpO2: 100% 98% 100% 97%  Weight: 70.9 kg (156 lb 6.4 oz)   68.4 kg (150 lb 14.4 oz)  Height:        Intake/Output Summary (Last 24 hours) at 10/08/2017 1056 Last data filed at 10/08/2017 0911 Gross per 24 hour  Intake 600 ml  Output 2025 ml  Net -1425 ml   Filed Weights   10/06/17 0600 10/07/17 0441 10/08/17 0555  Weight: 73.1 kg (161 lb 3.2 oz) 70.9 kg (156 lb 6.4 oz) 68.4 kg (150 lb 14.4 oz)    Gen: Chronically ill-appearing 77 y.o. female in no distress Pulm: Non-labored breathing supplemental oxygen. Diminished with no crackles or wheezes. CV: Regular rate and rhythm. II/VI systolic murmur at RUSB > carotids, no rub or gallop. No significant pedal edema. GI: Abdomen soft, non-tender, non-distended, with normoactive bowel sounds. Lower midline density palpated, nontender.  Ext: Warm, Right foot with severe charcot deformity, decreased pulses, cap refill fair.  Skin: No rashes, lesions other than right foot. Neuro: Alert and oriented. No focal neurological deficits. Psych: Judgement and insight appear normal. Mood & affect appropriate.   CBC: Recent Labs  Lab 10/04/17 0057 10/04/17 0159 10/04/17 0538 10/05/17 1037 10/06/17 0951  10/08/17 0631  WBC 8.4  --  7.1 7.2  --  7.7  NEUTROABS 6.3  --  4.5  --   --   --   HGB 8.7* 8.8* 8.2* 7.4* 9.0* 9.0*  HCT 30.1* 26.0* 27.8* 25.4* 29.7* 30.0*  MCV 84.8  --  85.5 86.1  --  84.7  PLT 285  --  290 252  --  259   Basic Metabolic Panel: Recent Labs  Lab 10/04/17 0159 10/04/17 0538 10/06/17 0356 10/07/17 0609 10/08/17 0631  NA 143 142 140 141 140  K 5.3* 4.8 4.5 4.1 3.8  CL 115* 112* 111 112* 104  CO2  --  21* 23 21* 26  GLUCOSE 172* 138* 148* 62* 134*  BUN 46* 43* 45* 42* 42*  CREATININE 1.80* 1.94* 1.89* 1.71* 1.77*  CALCIUM  --  8.8* 8.5* 8.7* 8.7*  MG  --  2.1  --   --   --    Liver Function Tests: Recent Labs  Lab 10/04/17 0538  AST 29  ALT 22  ALKPHOS 100  BILITOT 0.4  PROT 6.5  ALBUMIN 2.7*   Cardiac Enzymes: Recent Labs  Lab 10/04/17 0538 10/04/17  1351 10/04/17 1620  TROPONINI 0.06* 0.05* 0.05*   CBG: Recent Labs  Lab 10/07/17 0909 10/07/17 1156 10/07/17 1703 10/07/17 2205 10/08/17 0744  GLUCAP 137* 168* 108* 165* 116*   Thyroid Function Tests: Recent Labs    10/06/17 0951  FREET4 1.02     LOS: 4 days   Time spent: 25 minutes.  Tyrone Nine, MD [facility] [email or web address] Password [facility] 10/08/2017, 10:56 AM

## 2017-10-08 NOTE — Progress Notes (Signed)
Physical Therapy Treatment Patient Details Name: Emily Miranda MRN: [id number] DOB: 03/23/1941 Today's Date: 10/08/2017    History of Present Illness Pt is a 77 y/o female with a PMH significant for chronic combined systolic and diastolic CHF last EF measured in March 2018 was 40 to 45%, recent cardiac cath with stent placement, IDDM, CKDIII, hypertension and charcot foot on the right side plan to have right AKA at [facility] end of June 2019. Pt presents with sudden onset of SOB. She was found to be in acute respiratory failure with hypoxia. She initially required BiPAP and transitioned to [place] supplemental O2 2L/min.    PT Comments    Pt declining OOB mobility secondary to wanting to visit with family and c/o L shoulder pain. Pain currently 8/10 with heat application, 10/10 with shoulder flex/abd limited to 20', located in L scapular area, reports onset when reaching for item on tray this morning; RN aware and pt receiving pain medication. Pt with good recall of BLE therex from HHPT services, only requiring intermittent cues for technique. Planning for heart cath tomorrow. Will plan to continue working on transfers next session.   Follow Up Recommendations  Home health PT;Supervision/Assistance - 24 hour     Equipment Recommendations  None recommended by PT    Recommendations for Other Services       Precautions / Restrictions Precautions Precautions: Fall Restrictions Weight Bearing Restrictions: No    Mobility  Bed Mobility               General bed mobility comments: Declining OOB mobility; agreeable to LE therex  Transfers                    Ambulation/Gait                 Stairs             Wheelchair Mobility    Modified Rankin (Stroke Patients Only)       Balance                                            Cognition Arousal/Alertness: Awake/alert Behavior During Therapy: WFL for tasks assessed/performed Overall  Cognitive Status: Within Functional Limits for tasks assessed                                        Exercises Other Exercises Other Exercises: Reviewed HHPT therex, including heel slides, SLR, hip ABD/ADD, SAQ; pt with good recall of HEP, only requiring intermittent cues    General Comments        Pertinent Vitals/Pain Pain Assessment: 0-10 Pain Score: 8  Pain Location: L scapula  Pain Descriptors / Indicators: Sore;Sharp Pain Intervention(s): Monitored during session;Repositioned    Home Living                      Prior Function            PT Goals (current goals can now be found in the care plan section) Acute Rehab PT Goals Patient Stated Goal: Return home at d/c. PT Goal Formulation: With patient Time For Goal Achievement: 10/21/17 Potential to Achieve Goals: Good Progress towards PT goals: Not progressing toward goals - comment(Declining mobility)    Frequency  Min 3X/week      PT Plan Current plan remains appropriate    Co-evaluation              AM-PAC PT "6 Clicks" Daily Activity  Outcome Measure  Difficulty turning over in bed (including adjusting bedclothes, sheets and blankets)?: None Difficulty moving from lying on back to sitting on the side of the bed? : A Little Difficulty sitting down on and standing up from a chair with arms (e.g., wheelchair, bedside commode, etc,.)?: A Little Help needed moving to and from a bed to chair (including a wheelchair)?: A Little Help needed walking in hospital room?: Total Help needed climbing 3-5 steps with a railing? : Total 6 Click Score: 15    End of Session Equipment Utilized During Treatment: Gait belt Activity Tolerance: Patient limited by pain Patient left: in bed;with call bell/phone within reach;with family/visitor present Nurse Communication: Mobility status PT Visit Diagnosis: Unsteadiness on feet (R26.81);Pain;Muscle weakness (generalized) (M62.81);Repeated falls  (R29.6);Difficulty in walking, not elsewhere classified (R26.2) Pain - part of body: Leg     Time: 5366-4403 PT Time Calculation (min) (ACUTE ONLY): 11 min  Charges:  $Therapeutic Exercise: 8-22 mins                    G Codes:      Ina Homes, PT, DPT Acute Rehab Services  Pager: [phone]  Malachy Chamber 10/08/2017, 4:44 PM

## 2017-10-08 NOTE — Progress Notes (Signed)
patient is experiencing leg leg pain hot and b/p elevation gave pain med's and scheduled heart med's.. Adjusted temp and remove K-Pad for back for left shoulder plan t have a heart cath in the am

## 2017-10-09 ENCOUNTER — Inpatient Hospital Stay (HOSPITAL_COMMUNITY): Payer: Medicare Other

## 2017-10-09 ENCOUNTER — Inpatient Hospital Stay (HOSPITAL_COMMUNITY)
Admission: EM | Disposition: A | Discharge status: Home Health | Payer: Self-pay | Source: Home / Self Care | Attending: Internal Medicine

## 2017-10-09 DIAGNOSIS — J9601 Acute respiratory failure with hypoxia: Secondary | ICD-10-CM

## 2017-10-09 DIAGNOSIS — N183 Chronic kidney disease, stage 3 (moderate): Secondary | ICD-10-CM

## 2017-10-09 HISTORY — PX: RIGHT/LEFT HEART CATH AND CORONARY ANGIOGRAPHY: CATH118266

## 2017-10-09 LAB — GLUCOSE, CAPILLARY
Glucose-Capillary: 93 mg/dL (ref 65–99)
Glucose-Capillary: 46 mg/dL — ABNORMAL LOW (ref 65–99)
Glucose-Capillary: 109 mg/dL — ABNORMAL HIGH (ref 65–99)
Glucose-Capillary: 91 mg/dL (ref 65–99)
Glucose-Capillary: 116 mg/dL — ABNORMAL HIGH (ref 65–99)

## 2017-10-09 LAB — BASIC METABOLIC PANEL
Anion gap: 6 (ref 5–15)
Anion gap: 9 (ref 5–15)
BUN: 43 mg/dL — ABNORMAL HIGH (ref 6–20)
BUN: 41 mg/dL — ABNORMAL HIGH (ref 6–20)
CO2: 26 mmol/L (ref 22–32)
CO2: 26 mmol/L (ref 22–32)
Calcium: 8.4 mg/dL — ABNORMAL LOW (ref 8.9–10.3)
Calcium: 8.9 mg/dL (ref 8.9–10.3)
Chloride: 106 mmol/L (ref 101–111)
Chloride: 107 mmol/L (ref 101–111)
Creatinine, Ser: 1.87 mg/dL — ABNORMAL HIGH (ref 0.44–1.00)
Creatinine, Ser: 1.77 mg/dL — ABNORMAL HIGH (ref 0.44–1.00)
GFR calc Af Amer: 29 mL/min — ABNORMAL LOW (ref >60)
GFR calc Af Amer: 31 mL/min — ABNORMAL LOW (ref >60)
GFR calc non Af Amer: 25 mL/min — ABNORMAL LOW (ref >60)
GFR calc non Af Amer: 27 mL/min — ABNORMAL LOW (ref >60)
Glucose, Bld: 173 mg/dL — ABNORMAL HIGH (ref 65–99)
Glucose, Bld: 50 mg/dL — ABNORMAL LOW (ref 65–99)
Potassium: 3.8 mmol/L (ref 3.5–5.1)
Potassium: 3.8 mmol/L (ref 3.5–5.1)
Sodium: 138 mmol/L (ref 135–145)
Sodium: 142 mmol/L (ref 135–145)

## 2017-10-09 LAB — CREATININE, SERUM
Creatinine, Ser: 1.82 mg/dL — ABNORMAL HIGH (ref 0.44–1.00)
GFR calc Af Amer: 30 mL/min — ABNORMAL LOW (ref >60)
GFR calc non Af Amer: 26 mL/min — ABNORMAL LOW (ref >60)

## 2017-10-09 LAB — POCT I-STAT 3, ART BLOOD GAS (G3+)
Acid-base deficit: 1 mmol/L (ref 0.0–2.0)
Bicarbonate: 24.1 mmol/L (ref 20.0–28.0)
O2 Saturation: 96 %
TCO2: 25 mmol/L (ref 22–32)
pCO2 arterial: 39.8 mmHg (ref 32.0–48.0)
pH, Arterial: 7.391 (ref 7.350–7.450)
pO2, Arterial: 85 mmHg (ref 83.0–108.0)

## 2017-10-09 LAB — CBC
HCT: 29.7 % — ABNORMAL LOW (ref 36.0–46.0)
HCT: 28.2 % — ABNORMAL LOW (ref 36.0–46.0)
Hemoglobin: 8.9 g/dL — ABNORMAL LOW (ref 12.0–15.0)
Hemoglobin: 8.3 g/dL — ABNORMAL LOW (ref 12.0–15.0)
MCH: 25.1 pg — ABNORMAL LOW (ref 26.0–34.0)
MCH: 25.1 pg — ABNORMAL LOW (ref 26.0–34.0)
MCHC: 30 g/dL (ref 30.0–36.0)
MCHC: 29.4 g/dL — ABNORMAL LOW (ref 30.0–36.0)
MCV: 83.9 fL (ref 78.0–100.0)
MCV: 85.2 fL (ref 78.0–100.0)
Platelets: 272 10*3/uL (ref 150–400)
Platelets: 241 10*3/uL (ref 150–400)
RBC: 3.54 MIL/uL — ABNORMAL LOW (ref 3.87–5.11)
RBC: 3.31 MIL/uL — ABNORMAL LOW (ref 3.87–5.11)
RDW: 16.3 % — ABNORMAL HIGH (ref 11.5–15.5)
RDW: 16.3 % — ABNORMAL HIGH (ref 11.5–15.5)
WBC: 9 10*3/uL (ref 4.0–10.5)
WBC: 8.7 10*3/uL (ref 4.0–10.5)

## 2017-10-09 LAB — POCT I-STAT 3, VENOUS BLOOD GAS (G3P V)
Bicarbonate: 25.3 mmol/L (ref 20.0–28.0)
O2 Saturation: 63 %
TCO2: 27 mmol/L (ref 22–32)
pCO2, Ven: 45.1 mmHg (ref 44.0–60.0)
pH, Ven: 7.358 (ref 7.250–7.430)
pO2, Ven: 35 mmHg (ref 32.0–45.0)

## 2017-10-09 LAB — BRAIN NATRIURETIC PEPTIDE: B Natriuretic Peptide: 4427.6 pg/mL — ABNORMAL HIGH (ref 0.0–100.0)

## 2017-10-09 SURGERY — RIGHT/LEFT HEART CATH AND CORONARY ANGIOGRAPHY
Anesthesia: LOCAL

## 2017-10-09 MED ORDER — SODIUM CHLORIDE 0.9% FLUSH
3.0000 mL | Freq: Two times a day (BID) | INTRAVENOUS | Status: DC
  Administered 2017-10-10 – 2017-10-17 (×13): 3 mL via INTRAVENOUS

## 2017-10-09 MED ORDER — VERAPAMIL HCL 2.5 MG/ML IV SOLN
INTRAVENOUS | Status: AC
  Filled 2017-10-09: qty 2

## 2017-10-09 MED ORDER — HEPARIN (PORCINE) IN NACL 1000-0.9 UT/500ML-% IV SOLN
INTRAVENOUS | Status: AC
  Filled 2017-10-09: qty 1000

## 2017-10-09 MED ORDER — HYDRALAZINE HCL 50 MG PO TABS
50.0000 mg | ORAL_TABLET | Freq: Three times a day (TID) | ORAL | Status: DC
  Administered 2017-10-09 – 2017-10-18 (×28): 50 mg via ORAL
  Filled 2017-10-09 (×28): qty 1

## 2017-10-09 MED ORDER — HEPARIN (PORCINE) IN NACL 2-0.9 UNITS/ML
INTRAMUSCULAR | Status: AC | PRN
  Administered 2017-10-09 (×2): 500 mL

## 2017-10-09 MED ORDER — SODIUM CHLORIDE 0.9 % IV SOLN: 250.0000 mL | INTRAVENOUS | Status: DC | PRN

## 2017-10-09 MED ORDER — DEXTROSE 50 % IV SOLN
INTRAVENOUS | Status: AC
Start: 2017-10-09 — End: 2017-10-10
  Filled 2017-10-09: qty 50

## 2017-10-09 MED ORDER — SODIUM CHLORIDE 0.9% FLUSH
3.0000 mL | INTRAVENOUS | Status: DC | PRN
  Administered 2017-10-18: 3 mL via INTRAVENOUS
  Filled 2017-10-09: qty 3

## 2017-10-09 MED ORDER — MIDAZOLAM HCL 2 MG/2ML IJ SOLN
INTRAMUSCULAR | Status: AC
  Filled 2017-10-09: qty 2

## 2017-10-09 MED ORDER — ONDANSETRON HCL 4 MG/2ML IJ SOLN: 4.0000 mg | Freq: Four times a day (QID) | INTRAMUSCULAR | Status: DC | PRN

## 2017-10-09 MED ORDER — HEPARIN SODIUM (PORCINE) 5000 UNIT/ML IJ SOLN: 5000.0000 [IU] | Freq: Three times a day (TID) | INTRAMUSCULAR | Status: DC

## 2017-10-09 MED ORDER — SODIUM CHLORIDE 0.9 % IV SOLN: INTRAVENOUS | Status: AC

## 2017-10-09 MED ORDER — IOHEXOL 350 MG/ML SOLN
INTRAVENOUS | Status: DC | PRN
  Administered 2017-10-09: 28 mL

## 2017-10-09 MED ORDER — LIDOCAINE HCL (PF) 1 % IJ SOLN
INTRAMUSCULAR | Status: DC | PRN
  Administered 2017-10-09 (×2): 2 mL

## 2017-10-09 MED ORDER — TECHNETIUM TO 99M ALBUMIN AGGREGATED
4.0000 | Freq: Once | INTRAVENOUS | Status: AC | PRN
  Administered 2017-10-09: 4 via INTRAVENOUS

## 2017-10-09 MED ORDER — HEPARIN SODIUM (PORCINE) 1000 UNIT/ML IJ SOLN
INTRAMUSCULAR | Status: AC
  Filled 2017-10-09: qty 1

## 2017-10-09 MED ORDER — HEPARIN SODIUM (PORCINE) 1000 UNIT/ML IJ SOLN
INTRAMUSCULAR | Status: DC | PRN
  Administered 2017-10-09: 3000 [IU] via INTRAVENOUS

## 2017-10-09 MED ORDER — LIDOCAINE HCL (PF) 1 % IJ SOLN
INTRAMUSCULAR | Status: AC
  Filled 2017-10-09: qty 30

## 2017-10-09 MED ORDER — MIDAZOLAM HCL 2 MG/2ML IJ SOLN
INTRAMUSCULAR | Status: DC | PRN
  Administered 2017-10-09: 0.5 mg via INTRAVENOUS

## 2017-10-09 MED ORDER — VERAPAMIL HCL 2.5 MG/ML IV SOLN
INTRAVENOUS | Status: DC | PRN
  Administered 2017-10-09: 10 mL via INTRA_ARTERIAL

## 2017-10-09 MED ORDER — TECHNETIUM TC 99M DIETHYLENETRIAME-PENTAACETIC ACID
31.5000 | Freq: Once | INTRAVENOUS | Status: AC | PRN
  Administered 2017-10-09: 31.5 via INTRAVENOUS

## 2017-10-09 SURGICAL SUPPLY — 14 items
CATH BALLN WEDGE 5F 110CM (CATHETERS) ×1 IMPLANT
CATH INFINITI 5 FR JL3.5 (CATHETERS) ×1 IMPLANT
CATH INFINITI JR4 5F (CATHETERS) ×1 IMPLANT
COVER PRB 48X5XTLSCP FOLD TPE (BAG) IMPLANT
COVER PROBE 5X48 (BAG) ×2
DEVICE RAD COMP TR BAND LRG (VASCULAR PRODUCTS) ×1 IMPLANT
GLIDESHEATH SLEND A-KIT 6F 22G (SHEATH) ×1 IMPLANT
GUIDEWIRE INQWIRE 1.5J.035X260 (WIRE) IMPLANT
INQWIRE 1.5J .035X260CM (WIRE) ×2
KIT HEART LEFT (KITS) ×2 IMPLANT
PACK CARDIAC CATHETERIZATION (CUSTOM PROCEDURE TRAY) ×2 IMPLANT
SHEATH RAIN 4/5FR (SHEATH) ×1 IMPLANT
TRANSDUCER W/STOPCOCK (MISCELLANEOUS) ×2 IMPLANT
TUBING CIL FLEX 10 FLL-RA (TUBING) ×2 IMPLANT

## 2017-10-09 NOTE — Progress Notes (Addendum)
Progress Note  Patient Name: Emily Miranda Date of Encounter: 10/09/2017  Primary Cardiologist: Dr. Rollene Rotunda  Subjective   Patient reports feeling about the same as yesterday.  Continues to have mild back discomfort. Denies recurrent CP.   Inpatient Medications    Scheduled Meds: . aspirin EC  81 mg Oral Daily  . atorvastatin  40 mg Oral q1800  . carvedilol  12.5 mg Oral BID WC  . clopidogrel  75 mg Oral Q breakfast  . cyclobenzaprine  10 mg Oral TID  . enoxaparin (LOVENOX) injection  30 mg Subcutaneous Daily  . furosemide  40 mg Intravenous Q12H  . hydrALAZINE  25 mg Oral Q8H  . insulin aspart  0-9 Units Subcutaneous TID WC  . insulin glargine  20 Units Subcutaneous QHS  . isosorbide mononitrate  30 mg Oral Daily  . linagliptin  5 mg Oral Daily  . loratadine  10 mg Oral Daily  . pantoprazole  40 mg Oral Daily  . sodium chloride flush  3 mL Intravenous Q12H   Continuous Infusions: . sodium chloride    . sodium chloride 10 mL/hr at 10/09/17 0524   PRN Meds: sodium chloride, acetaminophen **OR** acetaminophen, fluticasone, nitroGLYCERIN, ondansetron **OR** ondansetron (ZOFRAN) IV, polyethylene glycol, sodium chloride flush, traMADol   Vital Signs    Vitals:   10/08/17 1146 10/08/17 1853 10/08/17 2053 10/09/17 0619  BP: (!) 151/77 (!) 153/110 (!) 166/89 (!) 158/69  Pulse: 74  95 76  Resp: 18  16   Temp:   98.5 F (36.9 C) 98.5 F (36.9 C)  TempSrc:   Oral Oral  SpO2: 97%  100% 99%  Weight:    154 lb 1.6 oz (69.9 kg)  Height:        Intake/Output Summary (Last 24 hours) at 10/09/2017 0806 Last data filed at 10/08/2017 2344 Gross per 24 hour  Intake 480 ml  Output 2800 ml  Net -2320 ml   Filed Weights   10/07/17 0441 10/08/17 0555 10/09/17 0619  Weight: 156 lb 6.4 oz (70.9 kg) 150 lb 14.4 oz (68.4 kg) 154 lb 1.6 oz (69.9 kg)    Physical Exam   General: Frail, elderly, NAD Skin: Warm, dry, intact  Head: Normocephalic, atraumatic, clear,  moist mucus membranes. Neck: Negative for carotid bruits. No JVD Lungs:Clear to ausculation bilaterally. No wheezes, rales, or rhonchi. Breathing is unlabored. Cardiovascular: RRR with S1 S2. + murmur. No rubs or gallops Abdomen: Soft, non-tender, non-distended with normoactive bowel sounds.  No obvious abdominal masses. MSK: Strength and tone appear normal for age. 5/5 in all extremities.  Significant deformity of her RLE. Extremities: Mild 1+ BLE edema. No clubbing or cyanosis. DP/PT pulses 1 + bilaterally.  Significant deformity of RLE, wound wrapping present Neuro: Alert and oriented. No focal deficits. No facial asymmetry. MAE spontaneously. Psych: Responds to questions appropriately with normal affect.    Labs    Chemistry Recent Labs  Lab 10/04/17 0538  10/07/17 0609 10/08/17 0631 10/09/17 0430  NA 142   < > 141 140 138  K 4.8   < > 4.1 3.8 3.8  CL 112*   < > 112* 104 106  CO2 21*   < > 21* 26 26  GLUCOSE 138*   < > 62* 134* 173*  BUN 43*   < > 42* 42* 43*  CREATININE 1.94*   < > 1.71* 1.77* 1.87*  CALCIUM 8.8*   < > 8.7* 8.7* 8.4*  PROT 6.5  --   --   --   --  ALBUMIN 2.7*  --   --   --   --   AST 29  --   --   --   --   ALT 22  --   --   --   --   ALKPHOS 100  --   --   --   --   BILITOT 0.4  --   --   --   --   GFRNONAA 24*   < > 28* 27* 25*  GFRAA 28*   < > 33* 31* 29*  ANIONGAP 9   < > 8 10 6    < > = values in this interval not displayed.     Hematology Recent Labs  Lab 10/05/17 1037 10/06/17 0951 10/08/17 0631 10/09/17 0430  WBC 7.2  --  7.7 9.0  RBC 2.95*  --  3.54* 3.54*  HGB 7.4* 9.0* 9.0* 8.9*  HCT 25.4* 29.7* 30.0* 29.7*  MCV 86.1  --  84.7 83.9  MCH 25.1*  --  25.4* 25.1*  MCHC 29.1*  --  30.0 30.0  RDW 16.5*  --  16.0* 16.3*  PLT 252  --  259 272    Cardiac Enzymes Recent Labs  Lab 10/04/17 0538 10/04/17 1351 10/04/17 1620  TROPONINI 0.06* 0.05* 0.05*    Recent Labs  Lab 10/04/17 0105  TROPIPOC 0.03     BNP Recent Labs  Lab  10/04/17 0057  BNP 4,315.3*     DDimer  Recent Labs  Lab 10/08/17 1423  DDIMER 3.09*    Radiology    No results found.  Telemetry    10/09/2017 NSR HR 84- Personally Reviewed  ECG    No new tracings as of 10/09/2017- Personally Reviewed  Cardiac Studies   ECHO: Echocardiogram 10/07/2017: Study Conclusions  - Left ventricle: The cavity size was normal. There was mild concentric hypertrophy. Systolic function was mildly reduced. The estimated ejection fraction was in the range of 45% to 50%. Hypokinesis of the basal-midinferoseptal myocardium. Doppler parameters are consistent with abnormal left ventricular relaxation (grade 1 diastolic dysfunction). Doppler parameters are consistent with high ventricular filling pressure. - Aortic valve: There was moderate stenosis. There was moderate regurgitation. Peak velocity (S): 365 cm/s. Mean gradient (S): 26 mm Hg. Valve area (VTI): 0.85 cm^2. Valve area (Vmax): 0.84 cm^2. Valve area (Vmean): 0.81 cm^2. Regurgitation pressure half-time: 379 ms. - Mitral valve: Transvalvular velocity was within the normal range. There was no evidence for stenosis. There was mild regurgitation. - Left atrium: The atrium was mildly dilated. - Right ventricle: The cavity size was normal. Wall thickness was normal. Systolic function was normal. - Right atrium: The atrium was severely dilated. - Tricuspid valve: There was mild regurgitation. - Pulmonary arteries: Systolic pressure was severely increased. PA peak pressure: 60 mm Hg (S).  Echocardiogram 07/30/2016: Study Conclusions  - Left ventricle: The cavity size was normal. Wall thickness was normal. Systolic function was mildly to moderately reduced. The estimated ejection fraction was in the range of 40% to 45%. Akinesis of the midanteroseptal myocardium. Features are consistent with a pseudonormal left ventricular filling pattern, with concomitant  abnormal relaxation and increased filling pressure (grade 2 diastolic dysfunction). - Aortic valve: Mildly to moderately calcified annulus. Mildly thickened, mildly calcified leaflets. There was mild stenosis. There was trivial regurgitation. Valve area (VTI): 1.31 cm^2. Valve area (Vmax): 1.28 cm^2. Valve area (Vmean): 1.26 cm^2. - Mitral valve: There was mild regurgitation. - Left atrium: The atrium was moderately dilated. - Pericardium, extracardiac: A  trivial pericardial effusion was identified. There was a left pleural effusion.  CATH:  Cardiac catheterization 10/11/2016: Conclusions: 1. Three vessel coronary artery disease, including diffuse LAD disease with sequential 50% lesions, sequential 70% and 60% mid LCx stenosis, 50% mid RCA lesion, and 90% stenosis in midportion of small rPDA. 2. Mildly elevated left ventricular filling pressure. 3. Moderately elevated aortic valve gradient (peak-to-peak gradient 25 mmHg).  Recommendations: 1. Medical therapy given improvement of chest pain and shortness with blood transfusion and diuresis. 2. If symptoms worsen despite stable hemoglobin and optimized volume status, FFR/PCI to the LCx could be performed. I would defer PCI to PDA given its small size.  Cardiac catheterization 11/05/2016 with PCI:  Mid RCA lesion, 50 %stenosed.  A STENT RESOLUTE ONYX 2.0X12 drug eluting stent was successfully placed.  Ost RPDA to RPDA lesion, 95 %stenosed.  Post intervention, there is a 0% residual stenosis.  A STENT RESOLUTE ONYX 2.25X15 drug eluting stent was successfully placed.  Prox RCA to Mid RCA lesion, 70 %stenosed.  Post intervention, there is a 0% residual stenosis.  Dist RCA lesion, 40 %stenosed.  Successful percutaneous coronary intervention to a 95% proximal PDA stenosis and a small caliber vessel, treated with a [place] 2.010 mm Cutting Balloon and DES stenting with a 2.012 mm Resolute Onyx stent postdilated to 2.1 mm  with the 95% stenosis being reduced to 0%.  Successful PCI to the mid RCA with Cutting Balloon atherotomy and DES stenting with a 2.2515 mm Resolute Onyx DES stent postdilated with a 2.5 noncompliant balloon and the tandem stenoses being reduced to 0%.  Patient Profile     77 y.o. female with a hx of chronic combined diastolic and systolic CHF LVEF 40 to 45% with mild to moderate aortic stenosis (2018), CAD with prior DES to RCA(10/2016) and LAD and LFX (07/2017), CKD stage III, DM 2, right foot charcot's deformity (plans for AKA at [facility]) and anemia who is being seen today for the evaluation of acute on chronic CHF and worsening aortic stenosis per echo at the request of Dr. Jarvis Newcomer.  Assessment & Plan    1.  Acute on chronic systolic diastolic heart failure: -Echocardiogram 2018 with LV EF 40 to 45% with G2 DD.  Echocardiogram from 10/07/2017 with stable LVEF 45 to 50% however, there is now evidence of basal mid inferior septal hypokinesis, grade 1 DD, severe RAE, mild LAE and dilated/blunted IVF as well as advancement of aortic stenosis -Plan is for right and left heart catheterization today 10/09/2016 for further evaluation -Weight, 154lb today, 164lb on admission -I&O, net -7.9 L since admission, 24-hour output 2.8 L -Creatinine, 1.87 today, up from 1.77 yesterday -hold diuretics due to worsening renal function -Carvedilol, hydralazine, Imdur, lisinopril on hold for cath  2.  Worsening aortic stenosis: -Advanced from last echocardiogram which revealed regurgitation with Valve area (VTI): 1.31 cm^2. Valve area (Vmax): 1.28 cm^2. Valve area (Vmean): 1.26 cm^2VTI 1.31 cm2 >0.85cm2. With moderate insufficiency.  -Echocardiogram from 10/07/2017 revealed mean gradient (S): 26 mm Hg. Valve area (VTI): 0.85 cm^2. Valve area (Vmax): 0.84 cm^2. Valve area (Vmean): 0.81 cm^2. -There is evidence of progressive pulmonary hypertension with PASP 60 mmHg -See plan above   3.  Acute on chronic CKD stage  III: -Creatinine, 1.87 today, up from 1.77 yesterday -Baseline creatinine approximately 1.5 -Will need to be mindful of contrast dye during cardiac cath  -Plan for gentle hydration post cath -hold diuretics -check BMET in am  4.  Chest pain with history  of CAD status post recent PCI with DES to proximal PDA/mid RCA/LAD (2018/2019): -Patient with recurrent chest pain intermittently, now with concern for stent restenosis given recent PCI approximately 3 months ago -Denies CP this AM  -Troponin, 0.06> 0.05> 0.05 -Continue ASA, Plavix, statin -hold off on cath at this time and get VQ scan due to elevated ddimer at 3 -Plan is for right/L heart cath tomorrow if VQ scan neg for PE today  5.  HTN: -Stable, 158/69, 166/89, 153/110 -Continue carvedilol, hydralazine, lisinopril on hold for cath -Carvedilol increased to 12.5 mg twice daily yesterday -increase Hydralazine to 50mg  TID due to elevated BP after stopping ACE I  6. PAD: -Severe deformity of right foot with planned right AKA at [facility] next week -She is followed by [facility], her primary team -Likely patient not currently a surgical candidate next week secondary to current medical status  7.  DM2: -Uncontrolled, last hemoglobin A1c 05/29/2017, 11.8 -Followed by diabetes coordinator last hospitalization -Currently on SSI and linagliptin per primary team  8.  Elevated d-dimer: -D-dimer, 3.09 -VQ today  Signed, Georgie Chard NP-C [facility] Pager: [phone] 10/09/2017, 8:06 AM    For questions or updates, please contact _  Please consult [email or web address] for contact info under Cardiology/STEMI.  Patient seen and independently examined with Georgie Chard, NP. We discussed all aspects of the encounter. I agree with the assessment and plan as stated above.  She denies any chest pain but continues to have some back discomfort.  No arrhythmias on telemetry.  GEN: Well nourished, well developed in no acute distress HEENT: Normal NECK: No  JVD; No carotid bruits LYMPHATICS: No lymphadenopathy CARDIAC:RRR, no rubs, gallops.  2/6 SM at RUSB to LLSB RESPIRATORY: diffuse wheezes and rhonchi ABDOMEN: Soft, non-tender, non-distended MUSCULOSKELETAL:  No edema; No deformity  SKIN: Warm and dry NEUROLOGIC:  Alert and oriented x 3 PSYCHIATRIC:  Normal affect    D-dimer is elevated at 3.09 and could explain acute worsening of pulmonary hypertension as well as shortness of breath and chest pain. She does have chest congestion on exam so I will check a chest xray and repeat BNP. Her creatinine is elevated to 1.87 today so would avoid CT angio.  I will order a VQ scan for today to rule out pulmonary embolism.  If VQ scan is negative then will plan right and left heart cath tomorrow to reassess coronary anatomy as well as evaluate for progression of aortic stenosis and get better assessment of PA pressures.  Repeat 2D echocardiogram this admission showed actually improved LV function from 40 to 45% to now 45 to 50% increase in PASP to 60 mmHg.  I am going to stop her Lasix today in hopes that her renal function will improve.  Her ACE inhibitor has also been stopped but she did get her dose yesterday prior to stopping that.  We will repeat a bmet in the morning.  BP is elevated today after stopping ACE inhibitor so  will increase hydralazine to 50 mg 3 times daily.  Signed: Armanda Magic, MD [facility] 10/09/2017

## 2017-10-09 NOTE — Progress Notes (Signed)
PROGRESS NOTE  Emily Miranda  HYW:[id number] DOB: Nov 29, 1940 DOA: 10/04/2017 PCP: Noni Saupe, MD Brief Narrative: Emily Miranda is a 77 y.o. female with a history of chronic combined CHF, AS, CAD s/p PCI, T2DM, stage III CKD, anemia, HTN and right foot charcot's deformity (with plans for AKA at [facility]) who presented with 1 day of sbrupt onset dyspnea. She was found to be hypoxic requiring BiPAP, transitioned to nasal cannula. Diuresis was started with improvement, though she still requires oxygen despite weight being down below previous DC weight. Echocardiogram demonstrated stable EF 45-50%, basal midinferoseptal hypokinesis, G1DD, severe RAE, mild LAE, and dilated/blunted IVF as well as advancement of aortic stenosis.   Assessment & Plan: Principal Problem:   Acute on chronic combined systolic and diastolic HF (heart failure) (HCC) Active Problems:   DM type 2 causing CKD stage 3 (HCC)   PVD (peripheral vascular disease) (HCC)   CAD S/P percutaneous coronary angioplasty   Charcot foot due to diabetes mellitus (HCC)   CHF (congestive heart failure) (HCC)   Acute respiratory failure with hypoxia (HCC)   Pressure injury of skin   DCM (dilated cardiomyopathy) (HCC)   Nonrheumatic aortic valve stenosis  Acute hypoxic respiratory failure: Due to CHF exacerbation. No evidence of pneumonia. D-dimer elevated with very low probability for PE on V/Q scanning. Pulmonary HTN has worsened, likely contributing. - Continue supplemental oxygen prn - Treat as below  Acute on chronic combined CHF:  - Appreciate cardiology recommendations. Cr up and plan is for catheterization 5/31, therefore cardiology stopped diuretic and BP medications.  - Daily weights, I/O. Last DC weight was 152lbs. Reports her EDW to be in the 140's.  - Continue coreg, lisinopril, bidil when appropriate per cardiology.   CAD s/p DES x2 June 2018 and DES March 2019: Troponin trend flat. No chest pain.  - Continue BB,  ASA, plavix, statin  Aortic stenosis: Advanced to moderate in the past year based on March 2018 echo. VTI 1.31 cm2 > 0.85cm2. With moderate insufficiency.  - Appreciate cardiology input. Planning cath.  Charcot deformity of right foot with pressure ulcer:  - WOC consulted, following local wound care recommendations.  - Follow up with [facility] orthopedics, planning AKA pending cardiology clearance  IDT2DM:  - Reprts taking lantus 40u qHS, but became hypoglycemic with 30u administered, so decreased to 20u. Refused last night and has been at inpatient goal. Will DC lantus for now.  - Hold [facility] - Continue linagliptin and SSI.   AKI on stage III CKD: Cr max 1.94, improved with diuresis. Historical baseline is increasing slowly, with most values in 1.4 - 1.6 range.  - Avoid nephrotoxins, monitor closely following contrast w/cath.  - PVR slightly elevated, will check renal U/S.   Anemia of chronic disease: Received 1u PRBCs earlier this admission.  - Monitor hgb, transfusion threshold felt to be 8g/dl. No elevation of bilirubin to indicate hemolysis.  Subclinical hypothyroidism: TSH 8.872, with normal free T4 1.02.  - Suggest recheck TFTs in 4-6 weeks.   Midline lower abdominal density on exam: ?Urinary retention.  - Post void residual today, d/w RN.   DVT prophylaxis: Lovenox Code Status: Full Family Communication: None at bedside Disposition Plan: Pending improvement in respiratory failure, cardiology evaluations  Consultants:   Cardiology  Procedures:   Echocardiogram 5/28:  - Left ventricle: The cavity size was normal. There was mild   concentric hypertrophy. Systolic function was mildly reduced. The   estimated ejection fraction was in the range  of 45% to 50%.   Hypokinesis of the basal-midinferoseptal myocardium. Doppler   parameters are consistent with abnormal left ventricular   relaxation (grade 1 diastolic dysfunction). Doppler parameters   are consistent with high  ventricular filling pressure. - Aortic valve: There was moderate stenosis. There was moderate   regurgitation. Peak velocity (S): 365 cm/s. Mean gradient (S): 26   mm Hg. Valve area (VTI): 0.85 cm^2. Valve area (Vmax): 0.84 cm^2.   Valve area (Vmean): 0.81 cm^2. Regurgitation pressure half-time:   379 ms. - Mitral valve: Transvalvular velocity was within the normal range.   There was no evidence for stenosis. There was mild regurgitation. - Left atrium: The atrium was mildly dilated. - Right ventricle: The cavity size was normal. Wall thickness was   normal. Systolic function was normal. - Right atrium: The atrium was severely dilated. - Tricuspid valve: There was mild regurgitation. - Pulmonary arteries: Systolic pressure was severely increased. PA   peak pressure: 60 mm Hg (S).  Antimicrobials:  None   Subjective: No change per pt. Having trouble sleeping at night, but drowsy during day. Mouth very dry since was NPO. Abdominal swelling stable.  Objective: Vitals:   10/08/17 1146 10/08/17 1853 10/08/17 2053 10/09/17 0619  BP: (!) 151/77 (!) 153/110 (!) 166/89 (!) 158/69  Pulse: 74  95 76  Resp: 18  16   Temp:   98.5 F (36.9 C) 98.5 F (36.9 C)  TempSrc:   Oral Oral  SpO2: 97%  100% 99%  Weight:    69.9 kg (154 lb 1.6 oz)  Height:        Intake/Output Summary (Last 24 hours) at 10/09/2017 1306 Last data filed at 10/08/2017 2344 Gross per 24 hour  Intake 240 ml  Output 2100 ml  Net -1860 ml   Filed Weights   10/07/17 0441 10/08/17 0555 10/09/17 0619  Weight: 70.9 kg (156 lb 6.4 oz) 68.4 kg (150 lb 14.4 oz) 69.9 kg (154 lb 1.6 oz)    Gen: Chronically ill-appearing 77 y.o. female in no distress Pulm: Non-labored breathing, mildly tachypneic when leaning up with pulm exam, supplemental oxygen. Diminished with no crackles or wheezes. CV: Regular rate and rhythm. II/VI systolic murmur at RUSB > carotids, no rub or gallop. No significant pedal edema. GI: Abdomen soft,  non-tender, non-distended, with normoactive bowel sounds. Lower midline density palpated, nontender.  Ext: Warm, Right foot with severe charcot deformity, decreased pulses, cap refill fair.  Skin: No rashes, lesions other than right foot. Neuro: Alert and oriented. No focal neurological deficits. Psych: Judgement and insight appear normal. Mood & affect appropriate.   CBC: Recent Labs  Lab 10/04/17 0057  10/04/17 0538 10/05/17 1037 10/06/17 0951 10/08/17 0631 10/09/17 0430  WBC 8.4  --  7.1 7.2  --  7.7 9.0  NEUTROABS 6.3  --  4.5  --   --   --   --   HGB 8.7*   < > 8.2* 7.4* 9.0* 9.0* 8.9*  HCT 30.1*   < > 27.8* 25.4* 29.7* 30.0* 29.7*  MCV 84.8  --  85.5 86.1  --  84.7 83.9  PLT 285  --  290 252  --  259 272   < > = values in this interval not displayed.   Basic Metabolic Panel: Recent Labs  Lab 10/04/17 0538 10/06/17 0356 10/07/17 0609 10/08/17 0631 10/09/17 0430  NA 142 140 141 140 138  K 4.8 4.5 4.1 3.8 3.8  CL 112* 111 112* 104 106  CO2 21* 23 21* 26 26  GLUCOSE 138* 148* 62* 134* 173*  BUN 43* 45* 42* 42* 43*  CREATININE 1.94* 1.89* 1.71* 1.77* 1.87*  CALCIUM 8.8* 8.5* 8.7* 8.7* 8.4*  MG 2.1  --   --   --   --    Liver Function Tests: Recent Labs  Lab 10/04/17 0538  AST 29  ALT 22  ALKPHOS 100  BILITOT 0.4  PROT 6.5  ALBUMIN 2.7*   Cardiac Enzymes: Recent Labs  Lab 10/04/17 0538 10/04/17 1351 10/04/17 1620  TROPONINI 0.06* 0.05* 0.05*   CBG: Recent Labs  Lab 10/08/17 0744 10/08/17 1144 10/08/17 1641 10/08/17 2146 10/09/17 0741  GLUCAP 116* 197* 260* 278* 93   Thyroid Function Tests: No results for input(s): TSH, T4TOTAL, FREET4, T3FREE, THYROIDAB in the last 72 hours.   LOS: 5 days   Time spent: 25 minutes.  Tyrone Nine, MD [facility] [email or web address] Password [email or web address] 10/09/2017, 1:06 PM

## 2017-10-09 NOTE — Progress Notes (Signed)
PT Cancellation Note  Patient Details Name: [name] MRN: [id number] DOB: [birth year]   Cancelled Treatment:    Reason Eval/Treat Not Completed: Patient at procedure or test/unavailable(Chart reviewed, RN consulted. Pt scheduled for cardiac cath this date. Please place new PT Order once patient is appropriate to resume PT. )  1:52 PM, 10/09/17 Rosamaria Lints, PT, DPT Physical Therapist - [place] [phone] (Pager)  [phone] (Office)      Cobain Morici C 10/09/2017, 1:52 PM

## 2017-10-09 NOTE — Progress Notes (Signed)
CRITICAL VALUE ALERT  Critical Value:  Blood sugar 46  Date & Time Notied:  5/3 1300  Provider Notified: Dr. Jarvis Newcomer  Orders Received/Actions taken: Dextrose 1 amp

## 2017-10-09 NOTE — Interval H&P Note (Signed)
Cath Lab Visit (complete for each Cath Lab visit)  Clinical Evaluation Leading to the Procedure:   ACS: Yes.    Non-ACS:    Anginal Classification: CCS III  Anti-ischemic medical therapy: Maximal Therapy (2 or more classes of medications)  Non-Invasive Test Results: No non-invasive testing performed  Prior CABG: No previous CABG      History and Physical Interval Note:  10/09/2017 4:27 PM  Emily Miranda  has presented today for surgery, with the diagnosis of chf  The various methods of treatment have been discussed with the patient and family. After consideration of risks, benefits and other options for treatment, the patient has consented to  Procedure(s): RIGHT/LEFT HEART CATH AND CORONARY ANGIOGRAPHY (N/A) as a surgical intervention .  The patient's history has been reviewed, patient examined, no change in status, stable for surgery.  I have reviewed the patient's chart and labs.  Questions were answered to the patient's satisfaction.     Lyn Records III

## 2017-10-09 NOTE — Plan of Care (Signed)
  Problem: Pain Managment: Goal: General experience of comfort will improve Outcome: Progressing   Problem: Clinical Measurements: Goal: Cardiovascular complication will be avoided Outcome: Progressing   

## 2017-10-09 NOTE — H&P (View-Only) (Signed)
Progress Note  Patient Name: Emily Miranda Date of Encounter: 10/09/2017  Primary Cardiologist: Dr. Rollene Rotunda  Subjective   Patient reports feeling about the same as yesterday.  Continues to have mild back discomfort. Denies recurrent CP.   Inpatient Medications    Scheduled Meds: . aspirin EC  81 mg Oral Daily  . atorvastatin  40 mg Oral q1800  . carvedilol  12.5 mg Oral BID WC  . clopidogrel  75 mg Oral Q breakfast  . cyclobenzaprine  10 mg Oral TID  . enoxaparin (LOVENOX) injection  30 mg Subcutaneous Daily  . furosemide  40 mg Intravenous Q12H  . hydrALAZINE  25 mg Oral Q8H  . insulin aspart  0-9 Units Subcutaneous TID WC  . insulin glargine  20 Units Subcutaneous QHS  . isosorbide mononitrate  30 mg Oral Daily  . linagliptin  5 mg Oral Daily  . loratadine  10 mg Oral Daily  . pantoprazole  40 mg Oral Daily  . sodium chloride flush  3 mL Intravenous Q12H   Continuous Infusions: . sodium chloride    . sodium chloride 10 mL/hr at 10/09/17 0524   PRN Meds: sodium chloride, acetaminophen **OR** acetaminophen, fluticasone, nitroGLYCERIN, ondansetron **OR** ondansetron (ZOFRAN) IV, polyethylene glycol, sodium chloride flush, traMADol   Vital Signs    Vitals:   10/08/17 1146 10/08/17 1853 10/08/17 2053 10/09/17 0619  BP: (!) 151/77 (!) 153/110 (!) 166/89 (!) 158/69  Pulse: 74  95 76  Resp: 18  16   Temp:   98.5 F (36.9 C) 98.5 F (36.9 C)  TempSrc:   Oral Oral  SpO2: 97%  100% 99%  Weight:    154 lb 1.6 oz (69.9 kg)  Height:        Intake/Output Summary (Last 24 hours) at 10/09/2017 0806 Last data filed at 10/08/2017 2344 Gross per 24 hour  Intake 480 ml  Output 2800 ml  Net -2320 ml   Filed Weights   10/07/17 0441 10/08/17 0555 10/09/17 0619  Weight: 156 lb 6.4 oz (70.9 kg) 150 lb 14.4 oz (68.4 kg) 154 lb 1.6 oz (69.9 kg)    Physical Exam   General: Frail, elderly, NAD Skin: Warm, dry, intact  Head: Normocephalic, atraumatic, clear,  moist mucus membranes. Neck: Negative for carotid bruits. No JVD Lungs:Clear to ausculation bilaterally. No wheezes, rales, or rhonchi. Breathing is unlabored. Cardiovascular: RRR with S1 S2. + murmur. No rubs or gallops Abdomen: Soft, non-tender, non-distended with normoactive bowel sounds.  No obvious abdominal masses. MSK: Strength and tone appear normal for age. 5/5 in all extremities.  Significant deformity of her RLE. Extremities: Mild 1+ BLE edema. No clubbing or cyanosis. DP/PT pulses 1 + bilaterally.  Significant deformity of RLE, wound wrapping present Neuro: Alert and oriented. No focal deficits. No facial asymmetry. MAE spontaneously. Psych: Responds to questions appropriately with normal affect.    Labs    Chemistry Recent Labs  Lab 10/04/17 0538  10/07/17 0609 10/08/17 0631 10/09/17 0430  NA 142   < > 141 140 138  K 4.8   < > 4.1 3.8 3.8  CL 112*   < > 112* 104 106  CO2 21*   < > 21* 26 26  GLUCOSE 138*   < > 62* 134* 173*  BUN 43*   < > 42* 42* 43*  CREATININE 1.94*   < > 1.71* 1.77* 1.87*  CALCIUM 8.8*   < > 8.7* 8.7* 8.4*  PROT 6.5  --   --   --   --  ALBUMIN 2.7*  --   --   --   --   AST 29  --   --   --   --   ALT 22  --   --   --   --   ALKPHOS 100  --   --   --   --   BILITOT 0.4  --   --   --   --   GFRNONAA 24*   < > 28* 27* 25*  GFRAA 28*   < > 33* 31* 29*  ANIONGAP 9   < > 8 10 6    < > = values in this interval not displayed.     Hematology Recent Labs  Lab 10/05/17 1037 10/06/17 0951 10/08/17 0631 10/09/17 0430  WBC 7.2  --  7.7 9.0  RBC 2.95*  --  3.54* 3.54*  HGB 7.4* 9.0* 9.0* 8.9*  HCT 25.4* 29.7* 30.0* 29.7*  MCV 86.1  --  84.7 83.9  MCH 25.1*  --  25.4* 25.1*  MCHC 29.1*  --  30.0 30.0  RDW 16.5*  --  16.0* 16.3*  PLT 252  --  259 272    Cardiac Enzymes Recent Labs  Lab 10/04/17 0538 10/04/17 1351 10/04/17 1620  TROPONINI 0.06* 0.05* 0.05*    Recent Labs  Lab 10/04/17 0105  TROPIPOC 0.03     BNP Recent Labs  Lab  10/04/17 0057  BNP 4,315.3*     DDimer  Recent Labs  Lab 10/08/17 1423  DDIMER 3.09*    Radiology    No results found.  Telemetry    10/09/2017 NSR HR 84- Personally Reviewed  ECG    No new tracings as of 10/09/2017- Personally Reviewed  Cardiac Studies   ECHO: Echocardiogram 10/07/2017: Study Conclusions  - Left ventricle: The cavity size was normal. There was mild concentric hypertrophy. Systolic function was mildly reduced. The estimated ejection fraction was in the range of 45% to 50%. Hypokinesis of the basal-midinferoseptal myocardium. Doppler parameters are consistent with abnormal left ventricular relaxation (grade 1 diastolic dysfunction). Doppler parameters are consistent with high ventricular filling pressure. - Aortic valve: There was moderate stenosis. There was moderate regurgitation. Peak velocity (S): 365 cm/s. Mean gradient (S): 26 mm Hg. Valve area (VTI): 0.85 cm^2. Valve area (Vmax): 0.84 cm^2. Valve area (Vmean): 0.81 cm^2. Regurgitation pressure half-time: 379 ms. - Mitral valve: Transvalvular velocity was within the normal range. There was no evidence for stenosis. There was mild regurgitation. - Left atrium: The atrium was mildly dilated. - Right ventricle: The cavity size was normal. Wall thickness was normal. Systolic function was normal. - Right atrium: The atrium was severely dilated. - Tricuspid valve: There was mild regurgitation. - Pulmonary arteries: Systolic pressure was severely increased. PA peak pressure: 60 mm Hg (S).  Echocardiogram 07/30/2016: Study Conclusions  - Left ventricle: The cavity size was normal. Wall thickness was normal. Systolic function was mildly to moderately reduced. The estimated ejection fraction was in the range of 40% to 45%. Akinesis of the midanteroseptal myocardium. Features are consistent with a pseudonormal left ventricular filling pattern, with concomitant  abnormal relaxation and increased filling pressure (grade 2 diastolic dysfunction). - Aortic valve: Mildly to moderately calcified annulus. Mildly thickened, mildly calcified leaflets. There was mild stenosis. There was trivial regurgitation. Valve area (VTI): 1.31 cm^2. Valve area (Vmax): 1.28 cm^2. Valve area (Vmean): 1.26 cm^2. - Mitral valve: There was mild regurgitation. - Left atrium: The atrium was moderately dilated. - Pericardium, extracardiac: A  trivial pericardial effusion was identified. There was a left pleural effusion.  CATH:  Cardiac catheterization 10/11/2016: Conclusions: 1. Three vessel coronary artery disease, including diffuse LAD disease with sequential 50% lesions, sequential 70% and 60% mid LCx stenosis, 50% mid RCA lesion, and 90% stenosis in midportion of small rPDA. 2. Mildly elevated left ventricular filling pressure. 3. Moderately elevated aortic valve gradient (peak-to-peak gradient 25 mmHg).  Recommendations: 1. Medical therapy given improvement of chest pain and shortness with blood transfusion and diuresis. 2. If symptoms worsen despite stable hemoglobin and optimized volume status, FFR/PCI to the LCx could be performed. I would defer PCI to PDA given its small size.  Cardiac catheterization 11/05/2016 with PCI:  Mid RCA lesion, 50 %stenosed.  A STENT RESOLUTE ONYX 2.0X12 drug eluting stent was successfully placed.  Ost RPDA to RPDA lesion, 95 %stenosed.  Post intervention, there is a 0% residual stenosis.  A STENT RESOLUTE ONYX 2.25X15 drug eluting stent was successfully placed.  Prox RCA to Mid RCA lesion, 70 %stenosed.  Post intervention, there is a 0% residual stenosis.  Dist RCA lesion, 40 %stenosed.  Successful percutaneous coronary intervention to a 95% proximal PDA stenosis and a small caliber vessel, treated with a Wol[place] 2.010 mm Cutting Balloon and DES stenting with a 2.012 mm Resolute Onyx stent postdilated to 2.1 mm  with the 95% stenosis being reduced to 0%.  Successful PCI to the mid RCA with Cutting Balloon atherotomy and DES stenting with a 2.2515 mm Resolute Onyx DES stent postdilated with a 2.5 noncompliant balloon and the tandem stenoses being reduced to 0%.  Patient Profile     77 y.o. female with a hx of chronic combined diastolic and systolic CHF LVEF 40 to 45% with mild to moderate aortic stenosis (2018), CAD with prior DES to RCA(10/2016) and LAD and LFX (07/2017), CKD stage III, DM 2, right foot charcot's deformity (plans for AKA at [facility]) and anemia who is being seen today for the evaluation of acute on chronic CHF and worsening aortic stenosis per echo at the request of Dr. Jarvis Newcomer.  Assessment & Plan    1.  Acute on chronic systolic diastolic heart failure: -Echocardiogram 2018 with LV EF 40 to 45% with G2 DD.  Echocardiogram from 10/07/2017 with stable LVEF 45 to 50% however, there is now evidence of basal mid inferior septal hypokinesis, grade 1 DD, severe RAE, mild LAE and dilated/blunted IVF as well as advancement of aortic stenosis -Plan is for right and left heart catheterization today 10/09/2016 for further evaluation -Weight, 154lb today, 164lb on admission -I&O, net -7.9 L since admission, 24-hour output 2.8 L -Creatinine, 1.87 today, up from 1.77 yesterday -hold diuretics due to worsening renal function -Carvedilol, hydralazine, Imdur, lisinopril on hold for cath  2.  Worsening aortic stenosis: -Advanced from last echocardiogram which revealed regurgitation with Valve area (VTI): 1.31 cm^2. Valve area (Vmax): 1.28 cm^2. Valve area (Vmean): 1.26 cm^2VTI 1.31 cm2 >0.85cm2. With moderate insufficiency.  -Echocardiogram from 10/07/2017 revealed mean gradient (S): 26 mm Hg. Valve area (VTI): 0.85 cm^2. Valve area (Vmax): 0.84 cm^2. Valve area (Vmean): 0.81 cm^2. -There is evidence of progressive pulmonary hypertension with PASP 60 mmHg -See plan above   3.  Acute on chronic CKD stage  III: -Creatinine, 1.87 today, up from 1.77 yesterday -Baseline creatinine approximately 1.5 -Will need to be mindful of contrast dye during cardiac cath  -Plan for gentle hydration post cath -hold diuretics -check BMET in am  4.  Chest pain with history  of CAD status post recent PCI with DES to proximal PDA/mid RCA/LAD (2018/2019): -Patient with recurrent chest pain intermittently, now with concern for stent restenosis given recent PCI approximately 3 months ago -Denies CP this AM  -Troponin, 0.06> 0.05> 0.05 -Continue ASA, Plavix, statin -hold off on cath at this time and get VQ scan due to elevated ddimer at 3 -Plan is for right/L heart cath tomorrow if VQ scan neg for PE today  5.  HTN: -Stable, 158/69, 166/89, 153/110 -Continue carvedilol, hydralazine, lisinopril on hold for cath -Carvedilol increased to 12.5 mg twice daily yesterday -increase Hydralazine to 50mg  TID due to elevated BP after stopping ACE I  6. PAD: -Severe deformity of right foot with planned right AKA at [facility] next week -She is followed by [facility], her primary team -Likely patient not currently a surgical candidate next week secondary to current medical status  7.  DM2: -Uncontrolled, last hemoglobin A1c 05/29/2017, 11.8 -Followed by diabetes coordinator last hospitalization -Currently on SSI and linagliptin per primary team  8.  Elevated d-dimer: -D-dimer, 3.09 -VQ today  Signed, Georgie Chard NP-C [facility] Pager: [phone] 10/09/2017, 8:06 AM    For questions or updates, please contact _  Please consult [email or web address] for contact info under Cardiology/STEMI.  Patient seen and independently examined with Georgie Chard, NP. We discussed all aspects of the encounter. I agree with the assessment and plan as stated above.  She denies any chest pain but continues to have some back discomfort.  No arrhythmias on telemetry.  GEN: Well nourished, well developed in no acute distress HEENT: Normal NECK: No  JVD; No carotid bruits LYMPHATICS: No lymphadenopathy CARDIAC:RRR, no rubs, gallops.  2/6 SM at RUSB to LLSB RESPIRATORY: diffuse wheezes and rhonchi ABDOMEN: Soft, non-tender, non-distended MUSCULOSKELETAL:  No edema; No deformity  SKIN: Warm and dry NEUROLOGIC:  Alert and oriented x 3 PSYCHIATRIC:  Normal affect    D-dimer is elevated at 3.09 and could explain acute worsening of pulmonary hypertension as well as shortness of breath and chest pain. She does have chest congestion on exam so I will check a chest xray and repeat BNP. Her creatinine is elevated to 1.87 today so would avoid CT angio.  I will order a VQ scan for today to rule out pulmonary embolism.  If VQ scan is negative then will plan right and left heart cath tomorrow to reassess coronary anatomy as well as evaluate for progression of aortic stenosis and get better assessment of PA pressures.  Repeat 2D echocardiogram this admission showed actually improved LV function from 40 to 45% to now 45 to 50% increase in PASP to 60 mmHg.  I am going to stop her Lasix today in hopes that her renal function will improve.  Her ACE inhibitor has also been stopped but she did get her dose yesterday prior to stopping that.  We will repeat a bmet in the morning.  BP is elevated today after stopping ACE inhibitor so  will increase hydralazine to 50 mg 3 times daily.  Signed: Armanda Magic, MD [facility] 10/09/2017

## 2017-10-10 ENCOUNTER — Encounter (HOSPITAL_COMMUNITY): Payer: Self-pay | Admitting: Interventional Cardiology

## 2017-10-10 ENCOUNTER — Inpatient Hospital Stay (HOSPITAL_COMMUNITY): Payer: Medicare Other

## 2017-10-10 LAB — BASIC METABOLIC PANEL
Anion gap: 11 (ref 5–15)
BUN: 40 mg/dL — ABNORMAL HIGH (ref 6–20)
CO2: 23 mmol/L (ref 22–32)
Calcium: 8.5 mg/dL — ABNORMAL LOW (ref 8.9–10.3)
Chloride: 103 mmol/L (ref 101–111)
Creatinine, Ser: 1.83 mg/dL — ABNORMAL HIGH (ref 0.44–1.00)
GFR calc Af Amer: 30 mL/min — ABNORMAL LOW (ref >60)
GFR calc non Af Amer: 26 mL/min — ABNORMAL LOW (ref >60)
Glucose, Bld: 99 mg/dL (ref 65–99)
Potassium: 3.9 mmol/L (ref 3.5–5.1)
Sodium: 137 mmol/L (ref 135–145)

## 2017-10-10 LAB — GLUCOSE, CAPILLARY
Glucose-Capillary: 87 mg/dL (ref 65–99)
Glucose-Capillary: 114 mg/dL — ABNORMAL HIGH (ref 65–99)
Glucose-Capillary: 153 mg/dL — ABNORMAL HIGH (ref 65–99)
Glucose-Capillary: 166 mg/dL — ABNORMAL HIGH (ref 65–99)

## 2017-10-10 MED ORDER — INSULIN GLARGINE 100 UNIT/ML ~~LOC~~ SOLN
15.0000 [IU] | Freq: Every day | SUBCUTANEOUS | Status: DC
  Administered 2017-10-10 – 2017-10-17 (×8): 15 [IU] via SUBCUTANEOUS
  Filled 2017-10-10 (×9): qty 0.15

## 2017-10-10 MED ORDER — CARVEDILOL 25 MG PO TABS
25.0000 mg | ORAL_TABLET | Freq: Two times a day (BID) | ORAL | Status: DC
  Administered 2017-10-10 – 2017-10-18 (×16): 25 mg via ORAL
  Filled 2017-10-10 (×10): qty 1
  Filled 2017-10-10: qty 2
  Filled 2017-10-10 (×8): qty 1

## 2017-10-10 MED ORDER — MORPHINE SULFATE (PF) 2 MG/ML IV SOLN
1.0000 mg | INTRAVENOUS | Status: DC | PRN
  Administered 2017-10-10 – 2017-10-18 (×12): 1 mg via INTRAVENOUS
  Filled 2017-10-10 (×12): qty 1

## 2017-10-10 MED FILL — Heparin Sod (Porcine)-NaCl IV Soln 1000 Unit/500ML-0.9%: INTRAVENOUS | Qty: 1000 | Status: AC

## 2017-10-10 NOTE — Progress Notes (Signed)
PT Cancellation Note  Patient Details Name: Emily Miranda MRN: [id number] DOB: June 12, 1940   Cancelled Treatment:     Pt refusing therapy citing high levels of pain in shoulder and headache. RN notified, will re attempt as time permits.   Etta Grandchild, PT, DPT Acute Rehab Services Pager: [phone]     Etta Grandchild 10/10/2017, 11:10 AM

## 2017-10-10 NOTE — Consult Note (Signed)
   [facility] CM Inpatient Consult   10/10/2017  SHANISA BONICA 09-01-1940 [id number]  Patient was assessed for [facility] Care Management for community services. Patient was previously active with [facility] Care Management.  Spoke with RN regarding patient and patient is currently having a lot of pain and awaiting pain medication.  Can follow up at a more appropriate time.   Of note, [facility] Care Management services does not replace or interfere with any services that are arranged by inpatient case management or social work. For additional questions or referrals please contact:  Charlesetta Shanks, RN BSN CCM [facility]  [phone] business mobile phone Toll free office [phone]

## 2017-10-10 NOTE — Care Management Note (Signed)
Case Management Note  Patient Details  Name: Emily Miranda MRN: [id number] Date of Birth: 1941/03/12  Subjective/Objective:     Pt admitted with CHF. She is from home with spouse and son.                Action/Plan: Pt with orders for [facility] services when ready for d/c. Pt states she used [facility] in the past and she would like to use them again. Drew with [facility] notified and accepted the referral. CM following.   Expected Discharge Date:                  Expected Discharge Plan:  Home w Home Health Services  In-House Referral:     Discharge planning Services  CM Consult  Post Acute Care Choice:  Home Health Choice offered to:  Patient, Spouse  DME Arranged:    DME Agency:     HH Arranged:  RN, PT, OT, Nurse's Aide HH Agency:  [facility]  Status of Service:  In process, will continue to follow  If discussed at Long Length of Stay Meetings, dates discussed:    Additional Comments:  Kermit Balo, RN 10/10/2017, 2:34 PM

## 2017-10-10 NOTE — Progress Notes (Signed)
Inpatient Diabetes Program Recommendations  AACE/ADA: New Consensus Statement on Inpatient Glycemic Control (2015)  Target Ranges:  Prepandial:   less than 140 mg/dL      Peak postprandial:   less than 180 mg/dL (1-2 hours)      Critically ill patients:  140 - 180 mg/dL   Lab Results  Component Value Date   GLUCAP 87 10/10/2017   HGBA1C 11.8 (H) 05/29/2017    Review of Glycemic ControlResults for ELADIA, RAMSEUR (MRN [id number]) as of 10/10/2017 10:34  Ref. Range 10/09/2017 12:37 10/09/2017 14:20 10/09/2017 17:23 10/09/2017 21:26 10/10/2017 08:06  Glucose-Capillary Latest Ref Range: 65 - 99 mg/dL 46 (L) 916 (H) 91 606 (H) 87   Diabetes history: Type 2 DM  Outpatient Diabetes medications:  Current orders for Inpatient glycemic control: Novolog sensitive tid with meals Lantus 20 units q HS Inpatient Diabetes Program Recommendations:   Please reduce Lantus to 15 units q HS.   Thanks,  Beryl Meager, RN, BC-ADM Inpatient Diabetes Coordinator Pager [phone] (8a-5p)

## 2017-10-10 NOTE — Progress Notes (Signed)
PROGRESS NOTE    Emily Miranda  VBT:[id number] DOB: 14-Oct-1940 DOA: 10/04/2017 PCP: Noni Saupe, MD     Brief Narrative:  Emily Miranda is a 77 y.o. female with a history of chronic combined CHF, AS, CAD s/p PCI, T2DM, stage III CKD, anemia, HTN and right foot charcot's deformity (with plans for AKA at [facility]) who presented with 1 day of abrupt onset dyspnea. She was found to be hypoxic requiring BiPAP, transitioned to nasal cannula. Diuresis was started with improvement, though she still requires oxygen despite weight being down below previous DC weight. Echocardiogram demonstrated stable EF 45-50%, basal midinferoseptal hypokinesis, G1DD, severe RAE, mild LAE, and dilated/blunted IVF as well as advancement of aortic stenosis.   New events last 24 hours / Subjective: Continues to have left shoulder pain which is chronic in nature. Denies worsening shortness of breath. Has dry cough.   Assessment & Plan:   Principal Problem:   Acute on chronic combined systolic and diastolic HF (heart failure) (HCC) Active Problems:   DM type 2 causing CKD stage 3 (HCC)   Mild aortic stenosis   Hypertensive cardiomyopathy, with heart failure (HCC)   Acute combined systolic and diastolic congestive heart failure (HCC)   PVD (peripheral vascular disease) (HCC)   CAD S/P percutaneous coronary angioplasty   Charcot foot due to diabetes mellitus (HCC)   Unstable angina (HCC)   Acute kidney injury (HCC)   CHF (congestive heart failure) (HCC)   Acute respiratory failure with hypoxia (HCC)   Pressure injury of skin   DCM (dilated cardiomyopathy) (HCC)   Nonrheumatic aortic valve stenosis   Acute hypoxic respiratory failure: Due to CHF exacerbation -Continue [place] O2 as needed  Acute on chronic combined systolic and diastolic CHF  -S/p heart cath 9/77 showed patent coronary stents, moderate pulm HTN  -Cardiology following  -Daily weights, I/O. Last DC weight was 152lbs. Reports her EDW to  be in the 140's.  -Diuretics, ACE on hold due to renal function -Continue coreg, hydralazine, imdur   AKI on stage III CKD -Historical baseline is increasing slowly, with most values in 1.4 - 1.6 range.  -Continue to monitor BMP   CAD s/p DES x2 June 2018 and DES March 2019 -Continue coreg , ASA, plavix, statin -Will need DAPT for at least 6 months, preferably 1 year   Aortic stenosis -S/p heart cath revealed mild-moderate AS with mean aortic valve gradient and aortic valve area 1.48 cm2  Charcot deformity of right foot with pressure ulcer -WOC consulted, following local wound care recommendations.  -Follow up with [facility] orthopedics -Per cardiology, likely not a surgical candidate next week. Will need DAPT for at least 6 months, preferably 1 year prior to any elective surgery   Insulin-dependent T2DM  -Lantus, SSI, tradjenta. Lantus dose adjusted today per diabetes coordinator recommendations   Anemia of chronic disease -Received 1u PRBCs earlier this admission -Monitor CBC   Subclinical hypothyroidism -TSH 8.872, with normal free T4 1.02  -Suggest recheck labs in 4-6 weeks   DVT prophylaxis: Lovenox Code Status: Full Family Communication: No family at bedside Disposition Plan: Pending improvement in respiratory status, kidney function    Consultants:   Cardiology   Procedures:   Heart cath 5/30  Antimicrobials:  Anti-infectives (From admission, onward)   None       Objective: Vitals:   10/10/17 0802 10/10/17 0957 10/10/17 1002 10/10/17 1149  BP: (!) 144/69 (!) 157/98 (!) 157/98 (!) 151/73  Pulse: (!) 105 61  97  Resp:      Temp:    98.6 F (37 C)  TempSrc:      SpO2: 99% 99% 99% 93%  Weight:      Height:        Intake/Output Summary (Last 24 hours) at 10/10/2017 1328 Last data filed at 10/10/2017 0615 Gross per 24 hour  Intake 480 ml  Output 1400 ml  Net -920 ml   Filed Weights   10/07/17 0441 10/08/17 0555 10/09/17 0619  Weight:  70.9 kg (156 lb 6.4 oz) 68.4 kg (150 lb 14.4 oz) 69.9 kg (154 lb 1.6 oz)    Examination:  General exam: Appears calm and comfortable  Respiratory system: Clear to auscultation. Respiratory effort normal. On [place] O2  Cardiovascular system: S1 & S2 heard, RRR. No JVD, murmurs, rubs, gallops or clicks. No pedal edema. Gastrointestinal system: Abdomen is nondistended, soft and nontender. No organomegaly or masses felt. Normal bowel sounds heard. Central nervous system: Alert and oriented. No focal neurological deficits. Extremities: +Right foot deformity  Skin: No rashes, lesions or ulcers on exposed skin, bilateral shins covered with dry and clean dressing  Psychiatry: Judgement and insight appear normal. Mood & affect appropriate.   Data Reviewed: I have personally reviewed following labs and imaging studies  CBC: Recent Labs  Lab 10/04/17 0057  10/04/17 0538 10/05/17 1037 10/06/17 0951 10/08/17 0631 10/09/17 0430 10/09/17 1836  WBC 8.4  --  7.1 7.2  --  7.7 9.0 8.7  NEUTROABS 6.3  --  4.5  --   --   --   --   --   HGB 8.7*   < > 8.2* 7.4* 9.0* 9.0* 8.9* 8.3*  HCT 30.1*   < > 27.8* 25.4* 29.7* 30.0* 29.7* 28.2*  MCV 84.8  --  85.5 86.1  --  84.7 83.9 85.2  PLT 285  --  290 252  --  259 272 241   < > = values in this interval not displayed.   Basic Metabolic Panel: Recent Labs  Lab 10/04/17 0538  10/07/17 0609 10/08/17 0631 10/09/17 0430 10/09/17 1240 10/09/17 1836 10/10/17 0425  NA 142   < > 141 140 138 142  --  137  K 4.8   < > 4.1 3.8 3.8 3.8  --  3.9  CL 112*   < > 112* 104 106 107  --  103  CO2 21*   < > 21* 26 26 26   --  23  GLUCOSE 138*   < > 62* 134* 173* 50*  --  99  BUN 43*   < > 42* 42* 43* 41*  --  40*  CREATININE 1.94*   < > 1.71* 1.77* 1.87* 1.77* 1.82* 1.83*  CALCIUM 8.8*   < > 8.7* 8.7* 8.4* 8.9  --  8.5*  MG 2.1  --   --   --   --   --   --   --    < > = values in this interval not displayed.   GFR: Estimated Creatinine Clearance: 24.9 mL/min (A) (by  C-G formula based on SCr of 1.83 mg/dL (H)). Liver Function Tests: Recent Labs  Lab 10/04/17 0538  AST 29  ALT 22  ALKPHOS 100  BILITOT 0.4  PROT 6.5  ALBUMIN 2.7*   No results for input(s): LIPASE, AMYLASE in the last 168 hours. No results for input(s): AMMONIA in the last 168 hours. Coagulation Profile: No results for input(s): INR, PROTIME in the last  168 hours. Cardiac Enzymes: Recent Labs  Lab 10/04/17 0538 10/04/17 1351 10/04/17 1620  TROPONINI 0.06* 0.05* 0.05*   BNP (last 3 results) No results for input(s): PROBNP in the last 8760 hours. HbA1C: No results for input(s): HGBA1C in the last 72 hours. CBG: Recent Labs  Lab 10/09/17 1420 10/09/17 1723 10/09/17 2126 10/10/17 0806 10/10/17 1145  GLUCAP 109* 91 116* 87 114*   Lipid Profile: No results for input(s): CHOL, HDL, LDLCALC, TRIG, CHOLHDL, LDLDIRECT in the last 72 hours. Thyroid Function Tests: No results for input(s): TSH, T4TOTAL, FREET4, T3FREE, THYROIDAB in the last 72 hours. Anemia Panel: No results for input(s): VITAMINB12, FOLATE, FERRITIN, TIBC, IRON, RETICCTPCT in the last 72 hours. Sepsis Labs: No results for input(s): PROCALCITON, LATICACIDVEN in the last 168 hours.  No results found for this or any previous visit (from the past 240 hour(s)).     Radiology Studies: Dg Chest 2 View  Result Date: 10/09/2017 CLINICAL DATA:  Shortness of breath. EXAM: CHEST - 2 VIEW COMPARISON:  10/04/2017. FINDINGS: Heart size normal. Mild pulmonary vascular prominence and interstitial prominence with bilateral pleural effusions. Findings consistent with mild CHF. IMPRESSION: Congestive heart failure with bilateral interstitial edema and bilateral pleural effusions. Electronically Signed   By: Maisie Fus  Register   On: 10/09/2017 12:52   US Renal  Result Date: 10/10/2017 CLINICAL DATA:  Initial evaluation for acute renal injury. EXAM: RENAL / URINARY TRACT ULTRASOUND COMPLETE COMPARISON:  Prior CT from  12/02/2016. FINDINGS: Right Kidney: Length: 11.3 cm. Echogenicity within normal limits. Lobulated renal contour noted. No mass or hydronephrosis visualized. Trace free perinephric fluid. Left Kidney: Length: 9.4 cm. Echogenicity within normal limits. Approximate 5 mm nonobstructive stone within the lower pole. 1.8 x 1.5 x 1.1 cm cyst present within the interpolar region. Bladder: Appears normal for degree of bladder distention. IMPRESSION: 1. No hydronephrosis. 2. 5 mm nonobstructive left renal nephrolithiasis. 3. 1.8 cm simple left renal cyst. Electronically Signed   By: Rise Mu M.D.   On: 10/10/2017 00:37   Nm Pulmonary Perf And Vent  Result Date: 10/09/2017 CLINICAL DATA:  Shortness of Breath EXAM: NUCLEAR MEDICINE VENTILATION - PERFUSION LUNG SCAN VIEWS: Anterior, posterior, left lateral, right lateral, RPO, LPO, RAO, LAO-ventilation and perfusion RADIOPHARMACEUTICALS:  31.5 mCi of Tc-48m DTPA aerosol inhalation and 4.15 mCi Tc49m-MAA IV COMPARISON:  Ventilation perfusion lung scan July 30, 2016. Chest radiograph Oct 09, 2017 FINDINGS: Ventilation: Radiotracer uptake bilaterally is homogeneous and symmetric. No ventilation defects are appreciable. Perfusion: Radiotracer uptake on the perfusion study is homogeneous and symmetric bilaterally. No perfusion defects are evident. No appreciable change from prior lung scan. IMPRESSION: No appreciable ventilation or perfusion defects. This study constitutes a very low probability of pulmonary embolus. Electronically Signed   By: Bretta Bang III M.D.   On: 10/09/2017 13:00      Scheduled Meds: . aspirin EC  81 mg Oral Daily  . atorvastatin  40 mg Oral q1800  . carvedilol  25 mg Oral BID WC  . clopidogrel  75 mg Oral Q breakfast  . cyclobenzaprine  10 mg Oral TID  . enoxaparin (LOVENOX) injection  30 mg Subcutaneous Daily  . hydrALAZINE  50 mg Oral Q8H  . insulin aspart  0-9 Units Subcutaneous TID WC  . insulin glargine  15 Units  Subcutaneous QHS  . isosorbide mononitrate  30 mg Oral Daily  . linagliptin  5 mg Oral Daily  . loratadine  10 mg Oral Daily  . pantoprazole  40 mg  Oral Daily  . sodium chloride flush  3 mL Intravenous Q12H   Continuous Infusions: . sodium chloride       LOS: 6 days    Time spent: 40 minutes   Noralee Stain, DO [facility] [email or web address] Password [email or web address] 10/10/2017, 1:28 PM

## 2017-10-10 NOTE — Progress Notes (Signed)
Pt is experiencing uncontrolled head ache MD aware

## 2017-10-10 NOTE — Progress Notes (Addendum)
Progress Note  Patient Name: Emily Miranda Date of Encounter: 10/10/2017  Primary Cardiologist: Rollene Rotunda, MD   Subjective   Denies any chest pain shortness of breath is improved.  Main complaint is bad neck and shoulder pain on left side  Inpatient Medications    Scheduled Meds: . aspirin EC  81 mg Oral Daily  . atorvastatin  40 mg Oral q1800  . carvedilol  12.5 mg Oral BID WC  . clopidogrel  75 mg Oral Q breakfast  . cyclobenzaprine  10 mg Oral TID  . enoxaparin (LOVENOX) injection  30 mg Subcutaneous Daily  . hydrALAZINE  50 mg Oral Q8H  . insulin aspart  0-9 Units Subcutaneous TID WC  . insulin glargine  20 Units Subcutaneous QHS  . isosorbide mononitrate  30 mg Oral Daily  . linagliptin  5 mg Oral Daily  . loratadine  10 mg Oral Daily  . pantoprazole  40 mg Oral Daily  . sodium chloride flush  3 mL Intravenous Q12H   Continuous Infusions: . sodium chloride     PRN Meds: sodium chloride, acetaminophen **OR** acetaminophen, fluticasone, nitroGLYCERIN, ondansetron **OR** ondansetron (ZOFRAN) IV, ondansetron (ZOFRAN) IV, polyethylene glycol, sodium chloride flush, traMADol   Vital Signs    Vitals:   10/09/17 1710 10/09/17 2037 10/10/17 0503 10/10/17 1002  BP: 119/65 (!) 146/65 (!) 145/69 (!) 157/98  Pulse: 68  87   Resp: 12  18   Temp:  98.5 F (36.9 C)    TempSrc:  Oral    SpO2: 99%  99% 99%  Weight:      Height:        Intake/Output Summary (Last 24 hours) at 10/10/2017 1109 Last data filed at 10/10/2017 0615 Gross per 24 hour  Intake 480 ml  Output 1400 ml  Net -920 ml   Filed Weights   10/07/17 0441 10/08/17 0555 10/09/17 0619  Weight: 156 lb 6.4 oz (70.9 kg) 150 lb 14.4 oz (68.4 kg) 154 lb 1.6 oz (69.9 kg)    Telemetry    NSR - Personally Reviewed  ECG    No new EKG to review- Personally Reviewed  Physical Exam   GEN: No acute distress.   Neck: No JVD Cardiac: RRR, no murmurs, rubs, or gallops.  Respiratory: crackles at right  base GI: Soft, nontender, non-distended  MS: No edema; No deformity. Neuro:  Nonfocal  Psych: Normal affect   Labs    Chemistry Recent Labs  Lab 10/04/17 0538  10/09/17 0430 10/09/17 1240 10/09/17 1836 10/10/17 0425  NA 142   < > 138 142  --  137  K 4.8   < > 3.8 3.8  --  3.9  CL 112*   < > 106 107  --  103  CO2 21*   < > 26 26  --  23  GLUCOSE 138*   < > 173* 50*  --  99  BUN 43*   < > 43* 41*  --  40*  CREATININE 1.94*   < > 1.87* 1.77* 1.82* 1.83*  CALCIUM 8.8*   < > 8.4* 8.9  --  8.5*  PROT 6.5  --   --   --   --   --   ALBUMIN 2.7*  --   --   --   --   --   AST 29  --   --   --   --   --   ALT 22  --   --   --   --   --  ALKPHOS 100  --   --   --   --   --   BILITOT 0.4  --   --   --   --   --   GFRNONAA 24*   < > 25* 27* 26* 26*  GFRAA 28*   < > 29* 31* 30* 30*  ANIONGAP 9   < > 6 9  --  11   < > = values in this interval not displayed.     Hematology Recent Labs  Lab 10/08/17 0631 10/09/17 0430 10/09/17 1836  WBC 7.7 9.0 8.7  RBC 3.54* 3.54* 3.31*  HGB 9.0* 8.9* 8.3*  HCT 30.0* 29.7* 28.2*  MCV 84.7 83.9 85.2  MCH 25.4* 25.1* 25.1*  MCHC 30.0 30.0 29.4*  RDW 16.0* 16.3* 16.3*  PLT 259 272 241    Cardiac Enzymes Recent Labs  Lab 10/04/17 0538 10/04/17 1351 10/04/17 1620  TROPONINI 0.06* 0.05* 0.05*    Recent Labs  Lab 10/04/17 0105  TROPIPOC 0.03     BNP Recent Labs  Lab 10/04/17 0057 10/09/17 1240  BNP 4,315.3* 4,427.6*     DDimer  Recent Labs  Lab 10/08/17 1423  DDIMER 3.09*     Radiology    Dg Chest 2 View  Result Date: 10/09/2017 CLINICAL DATA:  Shortness of breath. EXAM: CHEST - 2 VIEW COMPARISON:  10/04/2017. FINDINGS: Heart size normal. Mild pulmonary vascular prominence and interstitial prominence with bilateral pleural effusions. Findings consistent with mild CHF. IMPRESSION: Congestive heart failure with bilateral interstitial edema and bilateral pleural effusions. Electronically Signed   By: Maisie Fus  Register   On:  10/09/2017 12:52   US Renal  Result Date: 10/10/2017 CLINICAL DATA:  Initial evaluation for acute renal injury. EXAM: RENAL / URINARY TRACT ULTRASOUND COMPLETE COMPARISON:  Prior CT from 12/02/2016. FINDINGS: Right Kidney: Length: 11.3 cm. Echogenicity within normal limits. Lobulated renal contour noted. No mass or hydronephrosis visualized. Trace free perinephric fluid. Left Kidney: Length: 9.4 cm. Echogenicity within normal limits. Approximate 5 mm nonobstructive stone within the lower pole. 1.8 x 1.5 x 1.1 cm cyst present within the interpolar region. Bladder: Appears normal for degree of bladder distention. IMPRESSION: 1. No hydronephrosis. 2. 5 mm nonobstructive left renal nephrolithiasis. 3. 1.8 cm simple left renal cyst. Electronically Signed   By: Rise Mu M.D.   On: 10/10/2017 00:37   Nm Pulmonary Perf And Vent  Result Date: 10/09/2017 CLINICAL DATA:  Shortness of Breath EXAM: NUCLEAR MEDICINE VENTILATION - PERFUSION LUNG SCAN VIEWS: Anterior, posterior, left lateral, right lateral, RPO, LPO, RAO, LAO-ventilation and perfusion RADIOPHARMACEUTICALS:  31.5 mCi of Tc-9m DTPA aerosol inhalation and 4.15 mCi Tc26m-MAA IV COMPARISON:  Ventilation perfusion lung scan July 30, 2016. Chest radiograph Oct 09, 2017 FINDINGS: Ventilation: Radiotracer uptake bilaterally is homogeneous and symmetric. No ventilation defects are appreciable. Perfusion: Radiotracer uptake on the perfusion study is homogeneous and symmetric bilaterally. No perfusion defects are evident. No appreciable change from prior lung scan. IMPRESSION: No appreciable ventilation or perfusion defects. This study constitutes a very low probability of pulmonary embolus. Electronically Signed   By: Bretta Bang III M.D.   On: 10/09/2017 13:00    Cardiac Studies   Cardiac Cath 09/2017  Conclusion    Moderate pulmonary hypertension -mean PA pressure 35 mmHg.  Diffuse three-vessel coronary disease with widely patent  stents.  Left main is widely patent  Mid LAD contains a long region of stent which is widely patent.  Proximal and distal to the stent there  is diffuse coronary disease with up to 70% narrowing.  This vessel is significantly improved since the pre-PCI comparison from June 2018.  Widely patent stent in the proximal to mid circumflex.  Moderate diffuse disease is noted beyond the stent.  Widely patent stent in the mid RCA and in the proximal portion of the PDA.  Diffuse disease in the distal RCA improved compared to prior angiogram June 2018.  Normal left ventricular end-diastolic pressure.  Mild to moderate gradient across the aortic valve with calculated aortic valve area 1.48 cm  RECOMMENDATIONS:   Further management per treating team.   2D echo 10/07/2017 Study Conclusions  - Left ventricle: The cavity size was normal. There was mild   concentric hypertrophy. Systolic function was mildly reduced. The   estimated ejection fraction was in the range of 45% to 50%.   Hypokinesis of the basal-midinferoseptal myocardium. Doppler   parameters are consistent with abnormal left ventricular   relaxation (grade 1 diastolic dysfunction). Doppler parameters   are consistent with high ventricular filling pressure. - Aortic valve: There was moderate stenosis. There was moderate   regurgitation. Peak velocity (S): 365 cm/s. Mean gradient (S): 26   mm Hg. Valve area (VTI): 0.85 cm^2. Valve area (Vmax): 0.84 cm^2.   Valve area (Vmean): 0.81 cm^2. Regurgitation pressure half-time:   379 ms. - Mitral valve: Transvalvular velocity was within the normal range.   There was no evidence for stenosis. There was mild regurgitation. - Left atrium: The atrium was mildly dilated. - Right ventricle: The cavity size was normal. Wall thickness was   normal. Systolic function was normal. - Right atrium: The atrium was severely dilated. - Tricuspid valve: There was mild regurgitation. - Pulmonary  arteries: Systolic pressure was severely increased. PA   peak pressure: 60 mm Hg (S).   Patient Profile     77 y.o. female with a hx of chronic combined diastolic and systolic CHF LVEF 40 to 45%withmild to moderate aortic stenosis (2018), CAD with prior DES to RCA(10/2016) and LAD and LFX (07/2017),CKD stage III, DM 2,right footcharcot'sdeformity(pl[facility] at [facility] anemiawho is being seen today for the evaluation ofacute on chronic CHFand worsening aortic stenosis per echoat the request ofDr. Jarvis Newcomer.  Assessment & Plan    1.  Acute on chronic systolic diastolic heart failure: -Echocardiogram 2018 with LV EF 40 to 45% with G2 DD.  Echocardiogram from 10/07/2017 with stable LVEF 45 to 50% however, there is now evidence of basal mid inferior septal hypokinesis, grade 1 DD, severe RAE, mild LAE and dilated/blunted IVF as well as advancement of aortic stenosis -Left heart cath yesterday showed patent coronary stents with 65% stenosis of the proximal to mid LAD and 70% stenosis distal to the mid LAD stent, 60% distal left circumflex -Right heart cath showed moderate pulmonary hypertension with PASP 49 mmHg.  Pulmoanry capillary wedge 11 mmHg and LVEDP 8 mmHg. -Weight down 10 pounds since admission ([id number]) -I&O -she put out 1.4 L yesterday and is net -8.9 L.  Appears euvolemic as LVEDP and pulmonary capillary wedge were normal on cath yesterday -Creatinine up to 1.83 today -Diuretics and ACE inhibitor on hold -Continue hydralazine, Imdur -Increase carvedilol to 25 mg twice daily as blood pressure is elevated -Suspect chronic anemia is contributing some to heart failure. -she has crackles at right base so will repeat chest xray.    2.   Aortic stenosis: -Advanced from last echocardiogram which revealed regurgitation with Valve area (VTI): 1.31 cm^2. Valve area (Vmax):  1.28 cm^2. Valve area (Vmean): 1.26 cm^2VTI 1.31 cm2 >0.85cm2. With moderate insufficiency. -Echocardiogram  from 10/07/2017 revealed mean gradient (S): 26 mm Hg. Valve area (VTI): 0.85 cm^2. Valve area (Vmax): 0.84 cm^2. Valve area (Vmean): 0.81 cm^2. -Cardiac cath yesterday showed mild to moderate aortic stenosis with mean aortic valve gradient 24 mmHg and aortic valve area calculated 1.48 cm.  3.  Acute on chronic CKD stage III: -Creatinine, 1.83 today, up from 1.82 yesterday -Baseline creatinine approximately 1.5 -Diuretics and ACE inhibitor on hold -repeat BMET in a.m.  4.  Chest pain with history of CAD status post recent PCI with DES to proximal PDA/mid RCA/LAD (2018/2019): -Patient with recurrent chest pain intermittently s/p recent PCI approximately 3 months ago -Denies CP this AM  -Troponin, 0.06> 0.05> 0.05 -D-dimer elevated at 3 but no evidence of PE on VQ scan -Cardiac catheterization yesterday showed diffuse three-vessel coronary disease with widely patent stents.  Mid LAD stent was widely patent withDiffuse coronary disease up to 70% proximally and distally to the stent but improved from pre-PCI in June 2018.  Moderate diffuse disease beyond the patent stent in the proximal to mid left circumflex and diffuse disease in the distal RCA with widely patent stent in the mid RCA. -Continue medical management with ASA, Plavix, beta-blocker and statin  5.  HTN: -BP remains elevated 157/98 mmHg this morning -Continue hydralazine and increase carvedilol to 25 mg twice daily -ACE inhibitor stopped due to due to on chronic kidney disease  6. PAD: -Severe deformity of right foot with planned right AKA at [facility] next week -She is followed by [facility], her primary team -Likely patient not currently a surgical candidate next week secondary to current medical status as well as recent PCI in March.  She needs to be on DAPT for at least 6 months and preferably 1 year prior to coming off for any elective surgery  7.  DM2: -Uncontrolled, last hemoglobin A1c 05/29/2017, 11.8 -Followed by diabetes  coordinator last hospitalization -Currently on SSI and linagliptin per primary team  8.  Elevated d-dimer: -D-dimer, 3.09 -VQ scan with no evidence of PE  For questions or updates, please contact [facility] Please consult [email or web address] for contact info under Cardiology/STEMI.      Signed, Armanda Magic, MD  10/10/2017, 11:09 AM

## 2017-10-10 NOTE — Progress Notes (Signed)
Paged MD for Pain uncontrolled with tramadol, MD ordered Morphine 1mg  for pain. Chest Xray completed and Bladder scan shows 240s. MD aware.

## 2017-10-11 LAB — CBC
HCT: 26.5 % — ABNORMAL LOW (ref 36.0–46.0)
Hemoglobin: 8 g/dL — ABNORMAL LOW (ref 12.0–15.0)
MCH: 25.7 pg — ABNORMAL LOW (ref 26.0–34.0)
MCHC: 30.2 g/dL (ref 30.0–36.0)
MCV: 85.2 fL (ref 78.0–100.0)
Platelets: 245 10*3/uL (ref 150–400)
RBC: 3.11 MIL/uL — ABNORMAL LOW (ref 3.87–5.11)
RDW: 16.6 % — ABNORMAL HIGH (ref 11.5–15.5)
WBC: 7.2 10*3/uL (ref 4.0–10.5)

## 2017-10-11 LAB — BASIC METABOLIC PANEL
Anion gap: 9 (ref 5–15)
BUN: 47 mg/dL — ABNORMAL HIGH (ref 6–20)
CO2: 23 mmol/L (ref 22–32)
Calcium: 8.3 mg/dL — ABNORMAL LOW (ref 8.9–10.3)
Chloride: 106 mmol/L (ref 101–111)
Creatinine, Ser: 2 mg/dL — ABNORMAL HIGH (ref 0.44–1.00)
GFR calc Af Amer: 27 mL/min — ABNORMAL LOW (ref >60)
GFR calc non Af Amer: 23 mL/min — ABNORMAL LOW (ref >60)
Glucose, Bld: 190 mg/dL — ABNORMAL HIGH (ref 65–99)
Potassium: 4 mmol/L (ref 3.5–5.1)
Sodium: 138 mmol/L (ref 135–145)

## 2017-10-11 LAB — GLUCOSE, CAPILLARY
Glucose-Capillary: 138 mg/dL — ABNORMAL HIGH (ref 65–99)
Glucose-Capillary: 183 mg/dL — ABNORMAL HIGH (ref 65–99)
Glucose-Capillary: 199 mg/dL — ABNORMAL HIGH (ref 65–99)
Glucose-Capillary: 204 mg/dL — ABNORMAL HIGH (ref 65–99)
Glucose-Capillary: 238 mg/dL — ABNORMAL HIGH (ref 65–99)

## 2017-10-11 MED ORDER — SODIUM CHLORIDE 0.9 % IV SOLN
INTRAVENOUS | Status: AC
  Administered 2017-10-11: 12:00:00 via INTRAVENOUS

## 2017-10-11 NOTE — Progress Notes (Signed)
Progress Note  Patient Name: Emily Miranda Date of Encounter: 10/11/2017  Primary Cardiologist: Rollene Rotunda, MD   Subjective   Complains of severe left shoulder pain.  Any chest pain and shortness of breath is improved  Inpatient Medications    Scheduled Meds: . aspirin EC  81 mg Oral Daily  . atorvastatin  40 mg Oral q1800  . carvedilol  25 mg Oral BID WC  . clopidogrel  75 mg Oral Q breakfast  . cyclobenzaprine  10 mg Oral TID  . enoxaparin (LOVENOX) injection  30 mg Subcutaneous Daily  . hydrALAZINE  50 mg Oral Q8H  . insulin aspart  0-9 Units Subcutaneous TID WC  . insulin glargine  15 Units Subcutaneous QHS  . isosorbide mononitrate  30 mg Oral Daily  . linagliptin  5 mg Oral Daily  . loratadine  10 mg Oral Daily  . pantoprazole  40 mg Oral Daily  . sodium chloride flush  3 mL Intravenous Q12H   Continuous Infusions: . sodium chloride     PRN Meds: sodium chloride, acetaminophen **OR** acetaminophen, fluticasone, morphine injection, nitroGLYCERIN, ondansetron **OR** ondansetron (ZOFRAN) IV, polyethylene glycol, sodium chloride flush, traMADol   Vital Signs    Vitals:   10/10/17 2005 10/10/17 2300 10/11/17 0100 10/11/17 0530  BP: (!) 148/67 (!) 132/56  (!) 128/56  Pulse: 80 71  67  Resp: 20   20  Temp: 98.6 F (37 C)   98.5 F (36.9 C)  TempSrc: Oral   Oral  SpO2: 98% 99%  97%  Weight:   150 lb 12.8 oz (68.4 kg)   Height:        Intake/Output Summary (Last 24 hours) at 10/11/2017 1007 Last data filed at 10/11/2017 0921 Gross per 24 hour  Intake 420 ml  Output 650 ml  Net -230 ml   Filed Weights   10/08/17 0555 10/09/17 0619 10/11/17 0100  Weight: 150 lb 14.4 oz (68.4 kg) 154 lb 1.6 oz (69.9 kg) 150 lb 12.8 oz (68.4 kg)    Telemetry    Sinus rhythm- Personally Reviewed  ECG    EKG review- Personally Reviewed  Physical Exam   GEN: No acute distress.   Neck: No JVD Cardiac: RRR, no murmurs, rubs, or gallops.  Respiratory: Clear to  auscultation bilaterally. GI: Soft, nontender, non-distended  MS: No edema; No deformity. Neuro:  Nonfocal  Psych: Normal affect   Labs    Chemistry Recent Labs  Lab 10/09/17 1240 10/09/17 1836 10/10/17 0425 10/11/17 0533  NA 142  --  137 138  K 3.8  --  3.9 4.0  CL 107  --  103 106  CO2 26  --  23 23  GLUCOSE 50*  --  99 190*  BUN 41*  --  40* 47*  CREATININE 1.77* 1.82* 1.83* 2.00*  CALCIUM 8.9  --  8.5* 8.3*  GFRNONAA 27* 26* 26* 23*  GFRAA 31* 30* 30* 27*  ANIONGAP 9  --  11 9     Hematology Recent Labs  Lab 10/09/17 0430 10/09/17 1836 10/11/17 0533  WBC 9.0 8.7 7.2  RBC 3.54* 3.31* 3.11*  HGB 8.9* 8.3* 8.0*  HCT 29.7* 28.2* 26.5*  MCV 83.9 85.2 85.2  MCH 25.1* 25.1* 25.7*  MCHC 30.0 29.4* 30.2  RDW 16.3* 16.3* 16.6*  PLT 272 241 245    Cardiac Enzymes Recent Labs  Lab 10/04/17 1351 10/04/17 1620  TROPONINI 0.05* 0.05*   No results for input(s): TROPIPOC in the  last 168 hours.   BNP Recent Labs  Lab 10/09/17 1240  BNP 4,427.6*     DDimer  Recent Labs  Lab 10/08/17 1423  DDIMER 3.09*     Radiology    Dg Chest 2 View  Result Date: 10/10/2017 CLINICAL DATA:  Shortness of breath and weakness. EXAM: CHEST - 2 VIEW COMPARISON:  10/09/2017. FINDINGS: Mildly progressive enlargement of the cardiac silhouette. Mild increase in prominence of the pulmonary vasculature and interstitial markings. Small to moderate-sized bilateral pleural effusions without significant change. Mildly progressive left lower lobe airspace opacity. Mildly decreased right basilar airspace opacity. No acute bony abnormality. IMPRESSION: 1. Mildly progressive cardiomegaly and changes of congestive heart failure. 2. Mildly increased left lower lobe atelectasis or pneumonia and mildly improved right basilar atelectasis, pneumonia or alveolar edema. Electronically Signed   By: Beckie Salts M.D.   On: 10/10/2017 14:55   Dg Chest 2 View  Result Date: 10/09/2017 CLINICAL DATA:   Shortness of breath. EXAM: CHEST - 2 VIEW COMPARISON:  10/04/2017. FINDINGS: Heart size normal. Mild pulmonary vascular prominence and interstitial prominence with bilateral pleural effusions. Findings consistent with mild CHF. IMPRESSION: Congestive heart failure with bilateral interstitial edema and bilateral pleural effusions. Electronically Signed   By: Maisie Fus  Register   On: 10/09/2017 12:52   US Renal  Result Date: 10/10/2017 CLINICAL DATA:  Initial evaluation for acute renal injury. EXAM: RENAL / URINARY TRACT ULTRASOUND COMPLETE COMPARISON:  Prior CT from 12/02/2016. FINDINGS: Right Kidney: Length: 11.3 cm. Echogenicity within normal limits. Lobulated renal contour noted. No mass or hydronephrosis visualized. Trace free perinephric fluid. Left Kidney: Length: 9.4 cm. Echogenicity within normal limits. Approximate 5 mm nonobstructive stone within the lower pole. 1.8 x 1.5 x 1.1 cm cyst present within the interpolar region. Bladder: Appears normal for degree of bladder distention. IMPRESSION: 1. No hydronephrosis. 2. 5 mm nonobstructive left renal nephrolithiasis. 3. 1.8 cm simple left renal cyst. Electronically Signed   By: Rise Mu M.D.   On: 10/10/2017 00:37   Nm Pulmonary Perf And Vent  Result Date: 10/09/2017 CLINICAL DATA:  Shortness of Breath EXAM: NUCLEAR MEDICINE VENTILATION - PERFUSION LUNG SCAN VIEWS: Anterior, posterior, left lateral, right lateral, RPO, LPO, RAO, LAO-ventilation and perfusion RADIOPHARMACEUTICALS:  31.5 mCi of Tc-34m DTPA aerosol inhalation and 4.15 mCi Tc57m-MAA IV COMPARISON:  Ventilation perfusion lung scan July 30, 2016. Chest radiograph Oct 09, 2017 FINDINGS: Ventilation: Radiotracer uptake bilaterally is homogeneous and symmetric. No ventilation defects are appreciable. Perfusion: Radiotracer uptake on the perfusion study is homogeneous and symmetric bilaterally. No perfusion defects are evident. No appreciable change from prior lung scan. IMPRESSION: No  appreciable ventilation or perfusion defects. This study constitutes a very low probability of pulmonary embolus. Electronically Signed   By: Bretta Bang III M.D.   On: 10/09/2017 13:00    Cardiac Studies   Cardiac Cath 09/2017  Conclusion    Moderate pulmonary hypertension -mean PA pressure 35 mmHg.  Diffuse three-vessel coronary disease with widely patent stents.  Left main is widely patent  Mid LAD contains a long region of stent which is widely patent. Proximal and distal to the stent there is diffuse coronary disease with up to 70% narrowing. This vessel is significantly improved since the pre-PCI comparison from June 2018.  Widely patent stent in the proximal to mid circumflex. Moderate diffuse disease is noted beyond the stent.  Widely patent stent in the mid RCA and in the proximal portion of the PDA. Diffuse disease in the distal RCA  improved compared to prior angiogram June 2018.  Normal left ventricular end-diastolic pressure.  Mild to moderate gradient across the aortic valve with calculated aortic valve area 1.48 cm  RECOMMENDATIONS:   Further management per treating team.   2D echo 10/07/2017 Study Conclusions  - Left ventricle: The cavity size was normal. There was mild concentric hypertrophy. Systolic function was mildly reduced. The estimated ejection fraction was in the range of 45% to 50%. Hypokinesis of the basal-midinferoseptal myocardium. Doppler parameters are consistent with abnormal left ventricular relaxation (grade 1 diastolic dysfunction). Doppler parameters are consistent with high ventricular filling pressure. - Aortic valve: There was moderate stenosis. There was moderate regurgitation. Peak velocity (S): 365 cm/s. Mean gradient (S): 26 mm Hg. Valve area (VTI): 0.85 cm^2. Valve area (Vmax): 0.84 cm^2. Valve area (Vmean): 0.81 cm^2. Regurgitation pressure half-time: 379 ms. - Mitral valve: Transvalvular  velocity was within the normal range. There was no evidence for stenosis. There was mild regurgitation. - Left atrium: The atrium was mildly dilated. - Right ventricle: The cavity size was normal. Wall thickness was normal. Systolic function was normal. - Right atrium: The atrium was severely dilated. - Tricuspid valve: There was mild regurgitation. - Pulmonary arteries: Systolic pressure was severely increased. PA peak pressure: 60 mm Hg (S).    Patient Profile     77 y.o. female with a hx of chronic combined diastolic and systolic CHF LVEF 40 to 45%withmild to moderate aortic stenosis (2018), CAD with prior DES to RCA(10/2016) and LAD and LFX (07/2017),CKD stage III, DM 2,right footcharcot'sdeformity(pl[facility] at [facility] anemiawho is being seen today for the evaluation ofacute on chronic CHFand worsening aortic stenosis per echoat the request ofDr. Jarvis Newcomer.  Assessment & Plan    1. Acute on chronic systolic diastolic heart failure: -Echocardiogram 2018 with LV EF 40 to 45% with G2 DD. Echocardiogram from 10/07/2017 with stable LVEF 45 to 50% however, there is now evidence of basal mid inferior septal hypokinesis, grade 1 DD, severe RAE, mild LAE and dilated/blunted IVF as well as advancement of aortic stenosis -Left heart cath this admit showed patent coronary stents with 65% stenosis of the proximal to mid LAD and 70% stenosis distal to the mid LAD stent, 60% distal left circumflex -Right heart cath showed moderate pulmonary hypertension with PASP 49 mmHg.  PCWP 11 mmHg and LVEDP 8 mmHg. -Weight down 14 pounds since admission (164>150lb) -She put out 650 cc yesterday and is net -9.1 L -Appears euvolemic as LVEDP and pulmonary capillary wedge were normal on cath yesterday -Creatinine continues to climb and is now up to 2 -likely related to recent cardiac cath -Diuretics and ACE inhibitor on hold -Continue hydralazine, Imdur, carvedilol -Suspect chronic anemia is  contributing some to heart failure. -could consider gentle fluid hydration  2.  Aortic stenosis: -Advanced from last echocardiogram which revealed regurgitation withValve area (VTI): 1.31 cm^2. Valve area (Vmax): 1.28 cm^2. Valve area (Vmean): 1.26 cm^2VTI 1.31 cm2 >0.85cm2. With moderate insufficiency. -Echocardiogram from 10/07/2017 revealed mean gradient (S): 26 mm Hg. Valve area (VTI): 0.85 cm^2. Valve area (Vmax): 0.84 cm^2. Valve area (Vmean): 0.81 cm^2 -Cardiac cath yesterday showed mild to moderate aortic stenosis with mean aortic valve gradient 24 mmHg and aortic valve area calculated 1.48 cm.  3. Acute on chronic CKD stage III: -Creatinine, 2 today,up from 1.82 yesterday -likely related to recent cardiac cath -Baseline creatinine approximately 1.5 -Diuretics and ACE inhibitor on hold -consider gentle hydration since LVEDP and PCW normal at cath -repeat BMET  in a.m.  4. Chest pain with history of CAD status post recent PCI with DES to proximal PDA/mid RCA/LAD (2018/2019): -Patient with recurrent chest pain intermittently s/p recent PCI approximately 3 months ago -Denies CP this AM -Troponin, 0.06>0.05>0.05 -D-dimer elevated at 3 but no evidence of PE on VQ scan -Cardiac catheterization yesterday showed diffuse three-vessel coronary disease with widely patent stents.  Mid LAD stent was widely patent with Diffuse coronary disease up to 70% proximally and distally to the stent but improved from pre-PCI in June 2018.  Moderate diffuse disease beyond the patent stent in the proximal to mid left circumflex and diffuse disease in the distal RCA with widely patent stent in the mid RCA. -Continue medical management with ASA, Plavix, beta-blocker and statin  5. HTN: -BP improved at 128/56 mmHg -Continue hydralazine and carvedilol -ACE inhibitor stopped due to due to on acute on chronic kidney disease  6.PAD: -Severe deformity of right foot with planned right AKA at [facility]  next week -She is followed byWOC,her primary team -Likely patient not currently a surgical candidate next week secondary to current medical status as well as recent PCI in March.  She needs to be on DAPT for at least 6 months and preferably 1 year prior to coming off for any elective surgery  7. DM2: -Uncontrolled, last hemoglobin A1c 05/29/2017, 11.8 -Followed by diabetes coordinator last hospitalization -Currently on SSI and linagliptin per primary team  8. Elevated d-dimer: -D-dimer, 3.09 -VQ scan with no evidence of PE  For questions or updates, please contact [facility] Please consult [email or web address] for contact info under Cardiology/STEMI.      Signed, Armanda Magic, MD  10/11/2017, 10:07 AM

## 2017-10-11 NOTE — Progress Notes (Signed)
PROGRESS NOTE    MADDIE BRAZIER  WUJ:[id number] DOB: 16-Aug-1940 DOA: 10/04/2017 PCP: Noni Saupe, MD     Brief Narrative:  ELGIE LANDINO is a 77 y.o. female with a history of chronic combined CHF, AS, CAD s/p PCI, T2DM, stage III CKD, anemia, HTN and right foot charcot's deformity (with plans for AKA at [facility]) who presented with 1 day of abrupt onset dyspnea. She was found to be hypoxic requiring BiPAP, transitioned to nasal cannula. Diuresis was started with improvement, though she still requires oxygen despite weight being down below previous DC weight. Echocardiogram demonstrated stable EF 45-50%, basal midinferoseptal hypokinesis, G1DD, severe RAE, mild LAE, and dilated/blunted IVF as well as advancement of aortic stenosis.   New events last 24 hours / Subjective: States that she did not have a good night last night.  Complains of left shoulder pain which is chronic in nature.  Assessment & Plan:   Principal Problem:   Acute on chronic combined systolic and diastolic HF (heart failure) (HCC) Active Problems:   DM type 2 causing CKD stage 3 (HCC)   Mild aortic stenosis   Hypertensive cardiomyopathy, with heart failure (HCC)   Acute combined systolic and diastolic congestive heart failure (HCC)   PVD (peripheral vascular disease) (HCC)   CAD S/P percutaneous coronary angioplasty   Charcot foot due to diabetes mellitus (HCC)   Unstable angina (HCC)   Acute kidney injury (HCC)   CHF (congestive heart failure) (HCC)   Acute respiratory failure with hypoxia (HCC)   Pressure injury of skin   DCM (dilated cardiomyopathy) (HCC)   Nonrheumatic aortic valve stenosis   Acute hypoxic respiratory failure: Due to CHF exacerbation -Continue [place] O2 as needed  Acute on chronic combined systolic and diastolic CHF  -S/p heart cath 9/56 showed patent coronary stents, moderate pulm HTN  -Cardiology following  -Daily weights, I/O. Last DC weight was 152lbs. Reports her EDW to be in  the 140's.  -Diuretics, ACE on hold due to renal function -Continue coreg, hydralazine, imdur   AKI on stage III CKD -Historical baseline is increasing slowly, with most values in 1.4 - 1.6 range.  -Continue to monitor BMP  -Started cautious IV fluid 34ml/hr for next 12 hours  CAD s/p DES x2 June 2018 and DES March 2019 -Continue coreg , ASA, plavix, statin -Will need DAPT for at least 6 months, preferably 1 year   Aortic stenosis -S/p heart cath revealed mild-moderate AS with mean aortic valve gradient and aortic valve area 1.48 cm2  Charcot deformity of right foot with pressure ulcer -WOC consulted, following local wound care recommendations.  -Follow up with [facility] orthopedics -Per cardiology, likely not a surgical candidate next week. Will need DAPT for at least 6 months, preferably 1 year prior to any elective surgery   Insulin-dependent T2DM  -Lantus, SSI, tradjenta  Anemia of chronic disease -Received 1u PRBCs earlier this admission -Monitor CBC   Subclinical hypothyroidism -TSH 8.872, with normal free T4 1.02  -Suggest recheck labs in 4-6 weeks   DVT prophylaxis: Lovenox Code Status: Full Family Communication: Discussed with family at bedside Disposition Plan: Pending improvement in respiratory status, kidney function.  Plan for home health physical therapy on discharge   Consultants:   Cardiology   Procedures:   Heart cath 5/30  Antimicrobials:  Anti-infectives (From admission, onward)   None       Objective: Vitals:   10/10/17 2005 10/10/17 2300 10/11/17 0100 10/11/17 0530  BP: (!) 148/67 Marland Kitchen)  132/56  (!) 128/56  Pulse: 80 71  67  Resp: 20   20  Temp: 98.6 F (37 C)   98.5 F (36.9 C)  TempSrc: Oral   Oral  SpO2: 98% 99%  97%  Weight:   68.4 kg (150 lb 12.8 oz)   Height:        Intake/Output Summary (Last 24 hours) at 10/11/2017 1237 Last data filed at 10/11/2017 0921 Gross per 24 hour  Intake 420 ml  Output 650 ml  Net -230  ml   Filed Weights   10/08/17 0555 10/09/17 0619 10/11/17 0100  Weight: 68.4 kg (150 lb 14.4 oz) 69.9 kg (154 lb 1.6 oz) 68.4 kg (150 lb 12.8 oz)    Examination: General exam: Appears calm and comfortable, lethargic  Respiratory system: Clear to auscultation. Respiratory effort normal. Cardiovascular system: S1 & S2 heard, RRR. No JVD, murmurs, rubs, gallops or clicks. No pedal edema. Gastrointestinal system: Abdomen is nondistended, soft and nontender. No organomegaly or masses felt. Normal bowel sounds heard. Central nervous system: Alert and oriented. No focal neurological deficits. Extremities: +right foot deformity  Skin: No rashes, lesions or ulcers Psychiatry: Judgement and insight appear normal. Mood & affect appropriate.    Data Reviewed: I have personally reviewed following labs and imaging studies  CBC: Recent Labs  Lab 10/05/17 1037 10/06/17 0951 10/08/17 0631 10/09/17 0430 10/09/17 1836 10/11/17 0533  WBC 7.2  --  7.7 9.0 8.7 7.2  HGB 7.4* 9.0* 9.0* 8.9* 8.3* 8.0*  HCT 25.4* 29.7* 30.0* 29.7* 28.2* 26.5*  MCV 86.1  --  84.7 83.9 85.2 85.2  PLT 252  --  259 272 241 245   Basic Metabolic Panel: Recent Labs  Lab 10/08/17 0631 10/09/17 0430 10/09/17 1240 10/09/17 1836 10/10/17 0425 10/11/17 0533  NA 140 138 142  --  137 138  K 3.8 3.8 3.8  --  3.9 4.0  CL 104 106 107  --  103 106  CO2 26 26 26   --  23 23  GLUCOSE 134* 173* 50*  --  99 190*  BUN 42* 43* 41*  --  40* 47*  CREATININE 1.77* 1.87* 1.77* 1.82* 1.83* 2.00*  CALCIUM 8.7* 8.4* 8.9  --  8.5* 8.3*   GFR: Estimated Creatinine Clearance: 22.8 mL/min (A) (by C-G formula based on SCr of 2 mg/dL (H)). Liver Function Tests: No results for input(s): AST, ALT, ALKPHOS, BILITOT, PROT, ALBUMIN in the last 168 hours. No results for input(s): LIPASE, AMYLASE in the last 168 hours. No results for input(s): AMMONIA in the last 168 hours. Coagulation Profile: No results for input(s): INR, PROTIME in the  last 168 hours. Cardiac Enzymes: Recent Labs  Lab 10/04/17 1351 10/04/17 1620  TROPONINI 0.05* 0.05*   BNP (last 3 results) No results for input(s): PROBNP in the last 8760 hours. HbA1C: No results for input(s): HGBA1C in the last 72 hours. CBG: Recent Labs  Lab 10/10/17 1145 10/10/17 1638 10/10/17 2108 10/11/17 0045 10/11/17 0812  GLUCAP 114* 153* 166* 199* 138*   Lipid Profile: No results for input(s): CHOL, HDL, LDLCALC, TRIG, CHOLHDL, LDLDIRECT in the last 72 hours. Thyroid Function Tests: No results for input(s): TSH, T4TOTAL, FREET4, T3FREE, THYROIDAB in the last 72 hours. Anemia Panel: No results for input(s): VITAMINB12, FOLATE, FERRITIN, TIBC, IRON, RETICCTPCT in the last 72 hours. Sepsis Labs: No results for input(s): PROCALCITON, LATICACIDVEN in the last 168 hours.  No results found for this or any previous visit (from the past 240  hour(s)).     Radiology Studies: Dg Chest 2 View  Result Date: 10/10/2017 CLINICAL DATA:  Shortness of breath and weakness. EXAM: CHEST - 2 VIEW COMPARISON:  10/09/2017. FINDINGS: Mildly progressive enlargement of the cardiac silhouette. Mild increase in prominence of the pulmonary vasculature and interstitial markings. Small to moderate-sized bilateral pleural effusions without significant change. Mildly progressive left lower lobe airspace opacity. Mildly decreased right basilar airspace opacity. No acute bony abnormality. IMPRESSION: 1. Mildly progressive cardiomegaly and changes of congestive heart failure. 2. Mildly increased left lower lobe atelectasis or pneumonia and mildly improved right basilar atelectasis, pneumonia or alveolar edema. Electronically Signed   By: Beckie Salts M.D.   On: 10/10/2017 14:55   US Renal  Result Date: 10/10/2017 CLINICAL DATA:  Initial evaluation for acute renal injury. EXAM: RENAL / URINARY TRACT ULTRASOUND COMPLETE COMPARISON:  Prior CT from 12/02/2016. FINDINGS: Right Kidney: Length: 11.3 cm.  Echogenicity within normal limits. Lobulated renal contour noted. No mass or hydronephrosis visualized. Trace free perinephric fluid. Left Kidney: Length: 9.4 cm. Echogenicity within normal limits. Approximate 5 mm nonobstructive stone within the lower pole. 1.8 x 1.5 x 1.1 cm cyst present within the interpolar region. Bladder: Appears normal for degree of bladder distention. IMPRESSION: 1. No hydronephrosis. 2. 5 mm nonobstructive left renal nephrolithiasis. 3. 1.8 cm simple left renal cyst. Electronically Signed   By: Rise Mu M.D.   On: 10/10/2017 00:37   Nm Pulmonary Perf And Vent  Result Date: 10/09/2017 CLINICAL DATA:  Shortness of Breath EXAM: NUCLEAR MEDICINE VENTILATION - PERFUSION LUNG SCAN VIEWS: Anterior, posterior, left lateral, right lateral, RPO, LPO, RAO, LAO-ventilation and perfusion RADIOPHARMACEUTICALS:  31.5 mCi of Tc-49m DTPA aerosol inhalation and 4.15 mCi Tc63m-MAA IV COMPARISON:  Ventilation perfusion lung scan July 30, 2016. Chest radiograph Oct 09, 2017 FINDINGS: Ventilation: Radiotracer uptake bilaterally is homogeneous and symmetric. No ventilation defects are appreciable. Perfusion: Radiotracer uptake on the perfusion study is homogeneous and symmetric bilaterally. No perfusion defects are evident. No appreciable change from prior lung scan. IMPRESSION: No appreciable ventilation or perfusion defects. This study constitutes a very low probability of pulmonary embolus. Electronically Signed   By: Bretta Bang III M.D.   On: 10/09/2017 13:00      Scheduled Meds: . aspirin EC  81 mg Oral Daily  . atorvastatin  40 mg Oral q1800  . carvedilol  25 mg Oral BID WC  . clopidogrel  75 mg Oral Q breakfast  . cyclobenzaprine  10 mg Oral TID  . enoxaparin (LOVENOX) injection  30 mg Subcutaneous Daily  . hydrALAZINE  50 mg Oral Q8H  . insulin aspart  0-9 Units Subcutaneous TID WC  . insulin glargine  15 Units Subcutaneous QHS  . isosorbide mononitrate  30 mg Oral  Daily  . linagliptin  5 mg Oral Daily  . loratadine  10 mg Oral Daily  . pantoprazole  40 mg Oral Daily  . sodium chloride flush  3 mL Intravenous Q12H   Continuous Infusions: . sodium chloride    . sodium chloride 50 mL/hr at 10/11/17 1147     LOS: 7 days    Time spent: 25 minutes   Noralee Stain, DO [facility] [email or web address] Password [facility] 10/11/2017, 12:37 PM

## 2017-10-11 NOTE — Plan of Care (Signed)
  Problem: Clinical Measurements: Goal: Cardiovascular complication will be avoided Outcome: Progressing   Problem: Activity: Goal: Risk for activity intolerance will decrease Outcome: Progressing   Problem: Coping: Goal: Level of anxiety will decrease Outcome: Progressing   Problem: Skin Integrity: Goal: Risk for impaired skin integrity will decrease Outcome: Progressing   

## 2017-10-11 NOTE — Progress Notes (Signed)
PT Cancellation Note  Patient Details Name: Emily Miranda MRN: [id number] DOB: June 30, 1940   Cancelled Treatment:    Reason Eval/Treat Not Completed: Pain limiting ability to participate. Pt continues to refuse PT treatment secondary to L shoulder pain and BIL LE pain. Pt advised we may need to look into rehab options for pt recovery if she continues to remain in bed and refuse PT.   Kallie Locks, PTA Pager [phone] Acute Rehab   Sheral Apley 10/11/2017, 1:34 PM

## 2017-10-12 ENCOUNTER — Inpatient Hospital Stay (HOSPITAL_COMMUNITY): Payer: Medicare Other

## 2017-10-12 LAB — BASIC METABOLIC PANEL
Anion gap: 8 (ref 5–15)
BUN: 52 mg/dL — ABNORMAL HIGH (ref 6–20)
CO2: 24 mmol/L (ref 22–32)
Calcium: 8.4 mg/dL — ABNORMAL LOW (ref 8.9–10.3)
Chloride: 107 mmol/L (ref 101–111)
Creatinine, Ser: 2.57 mg/dL — ABNORMAL HIGH (ref 0.44–1.00)
GFR calc Af Amer: 20 mL/min — ABNORMAL LOW (ref >60)
GFR calc non Af Amer: 17 mL/min — ABNORMAL LOW (ref >60)
Glucose, Bld: 78 mg/dL (ref 65–99)
Potassium: 3.8 mmol/L (ref 3.5–5.1)
Sodium: 139 mmol/L (ref 135–145)

## 2017-10-12 LAB — GLUCOSE, CAPILLARY
Glucose-Capillary: 69 mg/dL (ref 65–99)
Glucose-Capillary: 88 mg/dL (ref 65–99)
Glucose-Capillary: 83 mg/dL (ref 65–99)
Glucose-Capillary: 107 mg/dL — ABNORMAL HIGH (ref 65–99)
Glucose-Capillary: 200 mg/dL — ABNORMAL HIGH (ref 65–99)

## 2017-10-12 MED ORDER — DEXTROMETHORPHAN POLISTIREX ER 30 MG/5ML PO SUER
15.0000 mg | Freq: Two times a day (BID) | ORAL | Status: DC | PRN
  Administered 2017-10-13: 15 mg via ORAL
  Filled 2017-10-12: qty 5

## 2017-10-12 MED ORDER — SODIUM CHLORIDE 0.9 % IV SOLN
INTRAVENOUS | Status: DC
  Administered 2017-10-12 (×2): via INTRAVENOUS

## 2017-10-12 MED ORDER — SODIUM CHLORIDE 0.9 % IV SOLN: INTRAVENOUS | Status: DC

## 2017-10-12 NOTE — Plan of Care (Signed)
  Problem: Clinical Measurements: Goal: Will remain free from infection Outcome: Progressing   Problem: Activity: Goal: Risk for activity intolerance will decrease Outcome: Progressing   Problem: Pain Managment: Goal: General experience of comfort will improve Outcome: Progressing   

## 2017-10-12 NOTE — Progress Notes (Signed)
Dressing changed performed on BLE per order. Pt tolerated well.

## 2017-10-12 NOTE — Progress Notes (Signed)
PROGRESS NOTE    Emily Miranda  ZOX:[id number] DOB: [birth year] DOA: 10/04/2017 PCP: Noni Saupe, MD     Brief Narrative:  Emily Miranda is a 77 y.o. female with a history of chronic combined CHF, AS, CAD s/p PCI, T2DM, stage III CKD, anemia, HTN and right foot charcot's deformity (with plans for AKA at [facility]) who presented with 1 day of abrupt onset dyspnea. She was found to be hypoxic requiring BiPAP, transitioned to nasal cannula. Diuresis was started with improvement, though she still requires oxygen despite weight being down below previous DC weight. Echocardiogram demonstrated stable EF 45-50%, basal midinferoseptal hypokinesis, G1DD, severe RAE, mild LAE, and dilated/blunted IVF as well as advancement of aortic stenosis. She underwent heart cath 5/30 showed patent coronary stents, moderate pulm HTN. Hospitalization further complicated by AKI.   New events last 24 hours / Subjective: Admits to cough that she is unable to bring out. Breathing has improved. Continues to have left shoulder pain.   Assessment & Plan:   Principal Problem:   Acute on chronic combined systolic and diastolic HF (heart failure) (HCC) Active Problems:   DM type 2 causing CKD stage 3 (HCC)   Mild aortic stenosis   Hypertensive cardiomyopathy, with heart failure (HCC)   Acute combined systolic and diastolic congestive heart failure (HCC)   PVD (peripheral vascular disease) (HCC)   CAD S/P percutaneous coronary angioplasty   Charcot foot due to diabetes mellitus (HCC)   Unstable angina (HCC)   Acute kidney injury (HCC)   CHF (congestive heart failure) (HCC)   Acute respiratory failure with hypoxia (HCC)   Pressure injury of skin   DCM (dilated cardiomyopathy) (HCC)   Nonrheumatic aortic valve stenosis   Acute hypoxic respiratory failure: Due to CHF exacerbation -Continue [place] O2 as needed  Acute on chronic combined systolic and diastolic CHF  -S/p heart cath 8/11 showed patent coronary  stents, moderate pulm HTN  -Cardiology following  -Daily weights, I/O. Last DC weight was 152lbs. Reports her EDW to be in the 140s.  -Diuretics, ACE on hold due to renal function -Continue coreg, hydralazine, imdur   AKI on stage III CKD -Historical baseline is increasing slowly, with most values in 1.4 - 1.6 range. Worsened in setting of heart cath.  -Continue to monitor BMP  -Started cautious IV fluid 42ml/hr for next 24 hours  CAD s/p DES x2 June 2018 and DES March 2019 -Continue coreg , ASA, plavix, statin -Will need DAPT for at least 6 months, preferably 1 year   Aortic stenosis -S/p heart cath revealed mild-moderate AS with mean aortic valve gradient and aortic valve area 1.48 cm2  Charcot deformity of right foot with pressure ulcer -WOC consulted, following local wound care recommendations.  -Follow up with [facility] orthopedics -Per cardiology, likely not a surgical candidate next week. Will need DAPT for at least 6 months, preferably 1 year prior to any elective surgery   Insulin-dependent T2DM  -Lantus, SSI, tradjenta  Anemia of chronic disease -Received 1u PRBCs earlier this admission -Monitor CBC   Subclinical hypothyroidism -TSH 8.872, with normal free T4 1.02  -Suggest recheck labs in 4-6 weeks   DVT prophylaxis: Lovenox Code Status: Full Family Communication: Discussed with family at bedside Disposition Plan: Pending improvement in respiratory status, kidney function.  Plan for home health physical therapy on discharge   Consultants:   Cardiology   Procedures:   Heart cath 5/30  Antimicrobials:  Anti-infectives (From admission, onward)   None  Objective: Vitals:   10/11/17 1252 10/11/17 2100 10/12/17 0433 10/12/17 1150  BP: (!) 132/59 140/64 (!) 144/63 128/60  Pulse: 85 69 70 63  Resp: 18 19 19 18   Temp: 98 F (36.7 C) 98.1 F (36.7 C) 98 F (36.7 C)   TempSrc: Oral Oral Oral   SpO2: 96% 99% 100% 100%  Weight:   69.4 kg  (152 lb 14.4 oz)   Height:        Intake/Output Summary (Last 24 hours) at 10/12/2017 1216 Last data filed at 10/12/2017 0900 Gross per 24 hour  Intake 630.83 ml  Output 850 ml  Net -219.17 ml   Filed Weights   10/09/17 0619 10/11/17 0100 10/12/17 0433  Weight: 69.9 kg (154 lb 1.6 oz) 68.4 kg (150 lb 12.8 oz) 69.4 kg (152 lb 14.4 oz)    Examination: General exam: Appears calm and comfortable  Respiratory system: Clear to auscultation. Respiratory effort normal. Cardiovascular system: S1 & S2 heard, RRR. No JVD, murmurs, rubs, gallops or clicks. No pedal edema. Gastrointestinal system: Abdomen is nondistended, soft and nontender. No organomegaly or masses felt. Normal bowel sounds heard. Central nervous system: Alert and oriented. No focal neurological deficits. Extremities: +right foot deformity  Skin: No rashes, lesions or ulcers Psychiatry: Judgement and insight appear normal. Mood & affect appropriate.    Data Reviewed: I have personally reviewed following labs and imaging studies  CBC: Recent Labs  Lab 10/06/17 0951 10/08/17 0631 10/09/17 0430 10/09/17 1836 10/11/17 0533  WBC  --  7.7 9.0 8.7 7.2  HGB 9.0* 9.0* 8.9* 8.3* 8.0*  HCT 29.7* 30.0* 29.7* 28.2* 26.5*  MCV  --  84.7 83.9 85.2 85.2  PLT  --  259 272 241 245   Basic Metabolic Panel: Recent Labs  Lab 10/09/17 0430 10/09/17 1240 10/09/17 1836 10/10/17 0425 10/11/17 0533 10/12/17 0646  NA 138 142  --  137 138 139  K 3.8 3.8  --  3.9 4.0 3.8  CL 106 107  --  103 106 107  CO2 26 26  --  23 23 24   GLUCOSE 173* 50*  --  99 190* 78  BUN 43* 41*  --  40* 47* 52*  CREATININE 1.87* 1.77* 1.82* 1.83* 2.00* 2.57*  CALCIUM 8.4* 8.9  --  8.5* 8.3* 8.4*   GFR: Estimated Creatinine Clearance: 17.7 mL/min (A) (by C-G formula based on SCr of 2.57 mg/dL (H)). Liver Function Tests: No results for input(s): AST, ALT, ALKPHOS, BILITOT, PROT, ALBUMIN in the last 168 hours. No results for input(s): LIPASE, AMYLASE in  the last 168 hours. No results for input(s): AMMONIA in the last 168 hours. Coagulation Profile: No results for input(s): INR, PROTIME in the last 168 hours. Cardiac Enzymes: No results for input(s): CKTOTAL, CKMB, CKMBINDEX, TROPONINI in the last 168 hours. BNP (last 3 results) No results for input(s): PROBNP in the last 8760 hours. HbA1C: No results for input(s): HGBA1C in the last 72 hours. CBG: Recent Labs  Lab 10/11/17 1611 10/11/17 2101 10/12/17 0726 10/12/17 0759 10/12/17 1141  GLUCAP 204* 183* 69 88 83   Lipid Profile: No results for input(s): CHOL, HDL, LDLCALC, TRIG, CHOLHDL, LDLDIRECT in the last 72 hours. Thyroid Function Tests: No results for input(s): TSH, T4TOTAL, FREET4, T3FREE, THYROIDAB in the last 72 hours. Anemia Panel: No results for input(s): VITAMINB12, FOLATE, FERRITIN, TIBC, IRON, RETICCTPCT in the last 72 hours. Sepsis Labs: No results for input(s): PROCALCITON, LATICACIDVEN in the last 168 hours.  No results found  for this or any previous visit (from the past 240 hour(s)).     Radiology Studies: Dg Chest 2 View  Result Date: 10/12/2017 CLINICAL DATA:  Cough and shortness of breath EXAM: CHEST - 2 VIEW COMPARISON:  Oct 10, 2017 FINDINGS: Stable cardiomegaly. Improved pulmonary edema. Small effusions with underlying atelectasis. No other abnormalities. IMPRESSION: Cardiomegaly with small effusions and underlying atelectasis. Improved pulmonary edema. Electronically Signed   By: Gerome Sam III M.D   On: 10/12/2017 10:40   Dg Chest 2 View  Result Date: 10/10/2017 CLINICAL DATA:  Shortness of breath and weakness. EXAM: CHEST - 2 VIEW COMPARISON:  10/09/2017. FINDINGS: Mildly progressive enlargement of the cardiac silhouette. Mild increase in prominence of the pulmonary vasculature and interstitial markings. Small to moderate-sized bilateral pleural effusions without significant change. Mildly progressive left lower lobe airspace opacity. Mildly  decreased right basilar airspace opacity. No acute bony abnormality. IMPRESSION: 1. Mildly progressive cardiomegaly and changes of congestive heart failure. 2. Mildly increased left lower lobe atelectasis or pneumonia and mildly improved right basilar atelectasis, pneumonia or alveolar edema. Electronically Signed   By: Beckie Salts M.D.   On: 10/10/2017 14:55      Scheduled Meds: . aspirin EC  81 mg Oral Daily  . atorvastatin  40 mg Oral q1800  . carvedilol  25 mg Oral BID WC  . clopidogrel  75 mg Oral Q breakfast  . cyclobenzaprine  10 mg Oral TID  . enoxaparin (LOVENOX) injection  30 mg Subcutaneous Daily  . hydrALAZINE  50 mg Oral Q8H  . insulin aspart  0-9 Units Subcutaneous TID WC  . insulin glargine  15 Units Subcutaneous QHS  . isosorbide mononitrate  30 mg Oral Daily  . linagliptin  5 mg Oral Daily  . loratadine  10 mg Oral Daily  . pantoprazole  40 mg Oral Daily  . sodium chloride flush  3 mL Intravenous Q12H   Continuous Infusions: . sodium chloride    . sodium chloride 50 mL/hr at 10/12/17 0940     LOS: 8 days    Time spent: 25 minutes   Noralee Stain, DO [facility] [email or web address] Password [email or web address] 10/12/2017, 12:16 PM

## 2017-10-12 NOTE — Progress Notes (Signed)
Hypoglycemic Event  CBG: 69  Treatment: orange juice   Symptoms: Asymptomatic    Follow-up CBG: Time: 0800   CBG Result: 83 Patient is doing fine eating breakfast at this time.     Emily Miranda  Emily Miranda

## 2017-10-12 NOTE — Progress Notes (Addendum)
Progress Note  Patient Name: Emily Miranda Date of Encounter: 10/12/2017  Primary Cardiologist: Rollene Rotunda, MD   Subjective   Denies any chest pain or shortness of breath.  Continues to complain of severe left shoulder pain which is worsened with any type of movement.  She still continues to have a heating pad on her shoulder.  Also has a cough  Inpatient Medications    Scheduled Meds: . aspirin EC  81 mg Oral Daily  . atorvastatin  40 mg Oral q1800  . carvedilol  25 mg Oral BID WC  . clopidogrel  75 mg Oral Q breakfast  . cyclobenzaprine  10 mg Oral TID  . enoxaparin (LOVENOX) injection  30 mg Subcutaneous Daily  . hydrALAZINE  50 mg Oral Q8H  . insulin aspart  0-9 Units Subcutaneous TID WC  . insulin glargine  15 Units Subcutaneous QHS  . isosorbide mononitrate  30 mg Oral Daily  . linagliptin  5 mg Oral Daily  . loratadine  10 mg Oral Daily  . pantoprazole  40 mg Oral Daily  . sodium chloride flush  3 mL Intravenous Q12H   Continuous Infusions: . sodium chloride    . sodium chloride     PRN Meds: sodium chloride, acetaminophen **OR** acetaminophen, fluticasone, morphine injection, nitroGLYCERIN, ondansetron **OR** ondansetron (ZOFRAN) IV, polyethylene glycol, sodium chloride flush, traMADol   Vital Signs    Vitals:   10/11/17 0530 10/11/17 1252 10/11/17 2100 10/12/17 0433  BP: (!) 128/56 (!) 132/59 140/64 (!) 144/63  Pulse: 67 85 69 70  Resp: 20 18 19 19   Temp: 98.5 F (36.9 C) 98 F (36.7 C) 98.1 F (36.7 C) 98 F (36.7 C)  TempSrc: Oral Oral Oral Oral  SpO2: 97% 96% 99% 100%  Weight:    152 lb 14.4 oz (69.4 kg)  Height:        Intake/Output Summary (Last 24 hours) at 10/12/2017 0911 Last data filed at 10/12/2017 0618 Gross per 24 hour  Intake 750.83 ml  Output 850 ml  Net -99.17 ml   Filed Weights   10/09/17 0619 10/11/17 0100 10/12/17 0433  Weight: 154 lb 1.6 oz (69.9 kg) 150 lb 12.8 oz (68.4 kg) 152 lb 14.4 oz (69.4 kg)    Telemetry      Normal sinus rhythm- Personally Reviewed  ECG    No new EKG to review- Personally Reviewed  Physical Exam   GEN:  Distress due to severe left shoulder pain Neck: No JVD Cardiac: RRR, no murmurs, rubs, or gallops.  Respiratory:  Diffuse rhonchi GI: Soft, nontender, non-distended  MS: No edema; No deformity. Neuro:  Nonfocal  Psych: Normal affect   Labs    Chemistry Recent Labs  Lab 10/10/17 0425 10/11/17 0533 10/12/17 0646  NA 137 138 139  K 3.9 4.0 3.8  CL 103 106 107  CO2 23 23 24   GLUCOSE 99 190* 78  BUN 40* 47* 52*  CREATININE 1.83* 2.00* 2.57*  CALCIUM 8.5* 8.3* 8.4*  GFRNONAA 26* 23* 17*  GFRAA 30* 27* 20*  ANIONGAP 11 9 8      Hematology Recent Labs  Lab 10/09/17 0430 10/09/17 1836 10/11/17 0533  WBC 9.0 8.7 7.2  RBC 3.54* 3.31* 3.11*  HGB 8.9* 8.3* 8.0*  HCT 29.7* 28.2* 26.5*  MCV 83.9 85.2 85.2  MCH 25.1* 25.1* 25.7*  MCHC 30.0 29.4* 30.2  RDW 16.3* 16.3* 16.6*  PLT 272 241 245    Cardiac EnzymesNo results for input(s): TROPONINI in  the last 168 hours. No results for input(s): TROPIPOC in the last 168 hours.   BNP Recent Labs  Lab 10/09/17 1240  BNP 4,427.6*     DDimer  Recent Labs  Lab 10/08/17 1423  DDIMER 3.09*     Radiology    Dg Chest 2 View  Result Date: 10/10/2017 CLINICAL DATA:  Shortness of breath and weakness. EXAM: CHEST - 2 VIEW COMPARISON:  10/09/2017. FINDINGS: Mildly progressive enlargement of the cardiac silhouette. Mild increase in prominence of the pulmonary vasculature and interstitial markings. Small to moderate-sized bilateral pleural effusions without significant change. Mildly progressive left lower lobe airspace opacity. Mildly decreased right basilar airspace opacity. No acute bony abnormality. IMPRESSION: 1. Mildly progressive cardiomegaly and changes of congestive heart failure. 2. Mildly increased left lower lobe atelectasis or pneumonia and mildly improved right basilar atelectasis, pneumonia or alveolar  edema. Electronically Signed   By: Beckie Salts M.D.   On: 10/10/2017 14:55    Cardiac Studies   Cardiac Cath 09/2017  Conclusion    Moderate pulmonary hypertension -mean PA pressure 35 mmHg.  Diffuse three-vessel coronary disease with widely patent stents.  Left main is widely patent  Mid LAD contains a long region of stent which is widely patent. Proximal and distal to the stent there is diffuse coronary disease with up to 70% narrowing. This vessel is significantly improved since the pre-PCI comparison from June 2018.  Widely patent stent in the proximal to mid circumflex. Moderate diffuse disease is noted beyond the stent.  Widely patent stent in the mid RCA and in the proximal portion of the PDA. Diffuse disease in the distal RCA improved compared to prior angiogram June 2018.  Normal left ventricular end-diastolic pressure.  Mild to moderate gradient across the aortic valve with calculated aortic valve area 1.48 cm  RECOMMENDATIONS:   Further management per treating team.   2D echo 10/07/2017 Study Conclusions  - Left ventricle: The cavity size was normal. There was mild concentric hypertrophy. Systolic function was mildly reduced. The estimated ejection fraction was in the range of 45% to 50%. Hypokinesis of the basal-midinferoseptal myocardium. Doppler parameters are consistent with abnormal left ventricular relaxation (grade 1 diastolic dysfunction). Doppler parameters are consistent with high ventricular filling pressure. - Aortic valve: There was moderate stenosis. There was moderate regurgitation. Peak velocity (S): 365 cm/s. Mean gradient (S): 26 mm Hg. Valve area (VTI): 0.85 cm^2. Valve area (Vmax): 0.84 cm^2. Valve area (Vmean): 0.81 cm^2. Regurgitation pressure half-time: 379 ms. - Mitral valve: Transvalvular velocity was within the normal range. There was no evidence for stenosis. There was mild regurgitation. - Left  atrium: The atrium was mildly dilated. - Right ventricle: The cavity size was normal. Wall thickness was normal. Systolic function was normal. - Right atrium: The atrium was severely dilated. - Tricuspid valve: There was mild regurgitation. - Pulmonary arteries: Systolic pressure was severely increased. PA peak pressure: 60 mm Hg (S).  Patient Profile     77 y.o. female with a hx of chronic combined diastolic and systolic CHF LVEF 40 to 45%withmild to moderate aortic stenosis (2018), CAD with prior DES to RCA(10/2016) and LAD and LFX (07/2017),CKD stage III, DM 2,right footcharcot'sdeformity(plans for AKA at [facility] anemiawho is being seen today for the evaluation ofacute on chronic CHFand worsening aortic stenosis per echoat the request ofDr. Jarvis Newcomer.  Assessment & Plan    1. Acute on chronic systolic diastolic heart failure: -Echocardiogram 2018 with LV EF 40 to 45% with G2 DD. Echocardiogram from  10/07/2017 with stable LVEF 45 to 50% however, there is now evidence of basal mid inferior septal hypokinesis, grade 1 DD, severe RAE, mild LAE and dilated/blunted IVF as well as advancement of aortic stenosis -Left heart cath this admit showed patent coronary stents with 65% stenosis of the proximal to mid LAD and 70% stenosis distal to the mid LAD stent, 60% distal left circumflex -Right heart cath showed moderate pulmonary hypertension with PASP 49 mmHg. PCWP 11 mmHg and LVEDP 8 mmHg. -Weight down 12 pounds since admission (164>152lb).  2lbs up from yesterday -She put out 850 cc yesterday and is net -9.4 L since admit -Appears euvolemic as LVEDP and pulmonary capillary wedge were normal on cath yesterday -Creatinine continues to climb and is now up to 2.5 today despite stopping ACE I and diuretics -Diuretics and ACE inhibitor on hold -Continue hydralazine, Imdur, carvedilol -will start gentle fluid hydration  2. Aortic stenosis: -Advanced from last echocardiogram which  revealed regurgitation withValve area (VTI): 1.31 cm^2. Valve area (Vmax): 1.28 cm^2. Valve area (Vmean): 1.26 cm^2VTI 1.31 cm2 >0.85cm2. With moderate insufficiency. -Echocardiogram from 10/07/2017 revealed mean gradient (S): 26 mm Hg. Valve area (VTI): 0.85 cm^2. Valve area (Vmax): 0.84 cm^2. Valve area (Vmean): 0.81 cm^2 -Cardiac cath showed mild to moderate aortic stenosis with mean aortic valve gradient 24 mmHg and aortic valve area calculated 1.48 cm.  3. Acute on chronic CKD stage III: -Creatinine 2.57 today -likely related to recent cardiac cath -Baseline creatinine approximately 1.5 -Diuretics and ACE inhibitor on hold -will start gentle hydration since LVEDP and PCW normal at cath -repeat BMET in a.m.  4. Chest pain with history of CAD status post recent PCI with DES to proximal PDA/mid RCA/LAD (2018/2019): -Patient with recurrent chest pain intermittently s/p recent PCI approximately 3 months ago -Denies CP this AM -Troponin, 0.06>0.05>0.05 -D-dimer elevated at 3 but no evidence of PE on VQ scan -Cardiac catheterization showed diffuse three-vessel coronary disease with widely patent stents. Mid LAD stent was widely patent with Diffuse coronary disease up to 70% proximally and distally to the stent but improved from pre-PCI in June 2018. Moderate diffuse disease beyond the patent stent in the proximal to mid left circumflex and diffuse disease in the distal RCA with widely patent stent in the mid RCA. -Continue medical management with ASA, Plavix, beta-blocker, long acting nitrate and statin  5. HTN: -BP controlled at 144/81mmHg -Continue hydralazine and carvedilol -ACE inhibitor stopped due to acute on chronic kidney disease  6.PAD: -Severe deformity of right foot with planned right AKA at [facility] next week -She is followed byWOC,her primary team -Likely patient not currently a surgical candidate next week secondary to current medical status as well as recent  PCI in March. She needs to be on DAPT for at least 6 months and preferably 1 year prior to coming off for any elective surgery  7. DM2: -Uncontrolled, last hemoglobin A1c 05/29/2017, 11.8 -Followed by diabetes coordinator last hospitalization -Currently on SSI and linagliptin per primary team  8. Elevated d-dimer: -D-dimer, 3.09 -VQ scan with no evidence of PE  9.  Severe left shoulder pain -Question whether this could be referred pain from underlying pneumonia.  Is worse when she takes a deep breath then she has a very junky sounding cough today.  X-ray 5/31 showed mildly increased left lower lobe atelectasis/pneumonia -Repeat PA and lateral chest x-ray today  For questions or updates, please contact [facility] Please consult [email or web address] for contact info under Cardiology/STEMI.  Signed, Armanda Magic, MD  10/12/2017, 9:11 AM

## 2017-10-13 DIAGNOSIS — I272 Pulmonary hypertension, unspecified: Secondary | ICD-10-CM

## 2017-10-13 LAB — BASIC METABOLIC PANEL
Anion gap: 6 (ref 5–15)
BUN: 56 mg/dL — ABNORMAL HIGH (ref 6–20)
CO2: 23 mmol/L (ref 22–32)
Calcium: 8 mg/dL — ABNORMAL LOW (ref 8.9–10.3)
Chloride: 109 mmol/L (ref 101–111)
Creatinine, Ser: 2.57 mg/dL — ABNORMAL HIGH (ref 0.44–1.00)
GFR calc Af Amer: 20 mL/min — ABNORMAL LOW (ref >60)
GFR calc non Af Amer: 17 mL/min — ABNORMAL LOW (ref >60)
Glucose, Bld: 217 mg/dL — ABNORMAL HIGH (ref 65–99)
Potassium: 3.9 mmol/L (ref 3.5–5.1)
Sodium: 138 mmol/L (ref 135–145)

## 2017-10-13 LAB — GLUCOSE, CAPILLARY
Glucose-Capillary: 184 mg/dL — ABNORMAL HIGH (ref 65–99)
Glucose-Capillary: 145 mg/dL — ABNORMAL HIGH (ref 65–99)
Glucose-Capillary: 170 mg/dL — ABNORMAL HIGH (ref 65–99)
Glucose-Capillary: 181 mg/dL — ABNORMAL HIGH (ref 65–99)

## 2017-10-13 NOTE — Progress Notes (Signed)
PROGRESS NOTE    Emily Miranda  ZOX:[id number] DOB: 1941-02-24 DOA: 10/04/2017 PCP: Noni Saupe, MD     Brief Narrative:  Emily Miranda is a 77 y.o. female with a history of chronic combined CHF, AS, CAD s/p PCI, T2DM, stage III CKD, anemia, HTN and right foot charcot's deformity (with plans for AKA at [facility]) who presented with 1 day of abrupt onset dyspnea. She was found to be hypoxic requiring BiPAP, transitioned to nasal cannula. Diuresis was started with improvement and CXR f/u showed improved edema, although she still requires oxygen despite weight being down below previous DC weight. Echocardiogram demonstrated stable EF 45-50%, basal midinferoseptal hypokinesis, G1DD, severe RAE, mild LAE, and dilated/blunted IVF as well as advancement of aortic stenosis. She underwent heart cath 5/30 showed patent coronary stents, moderate pulm HTN. Hospitalization further complicated by AKI.   New events last 24 hours / Subjective: Breathing stable.   Assessment & Plan:   Principal Problem:   Acute on chronic combined systolic and diastolic HF (heart failure) (HCC) Active Problems:   DM type 2 causing CKD stage 3 (HCC)   Mild aortic stenosis   Hypertensive cardiomyopathy, with heart failure (HCC)   Acute combined systolic and diastolic congestive heart failure (HCC)   PVD (peripheral vascular disease) (HCC)   CAD S/P percutaneous coronary angioplasty   Charcot foot due to diabetes mellitus (HCC)   Unstable angina (HCC)   Acute kidney injury (HCC)   CHF (congestive heart failure) (HCC)   Acute respiratory failure with hypoxia (HCC)   Pressure injury of skin   DCM (dilated cardiomyopathy) (HCC)   Nonrheumatic aortic valve stenosis   Acute hypoxic respiratory failure: Due to CHF exacerbation -Continue [place] O2 as needed  Acute on chronic combined systolic and diastolic CHF  -S/p heart cath 8/11 showed patent coronary stents, moderate pulm HTN  -Cardiology following  -Daily  weights, I/O. Diuresed 164 > 150 lbs then started IVF's for creat bump -Diuretics, ACE on hold due to renal function -Continue coreg, hydralazine, imdur   AKI on stage IV CKD - progressive CKD over last few years, creat 1.5 in 2018 and in 2019 baseline creat 1.6- 1.9, suspect cardiorenal component -Started cautious IV fluid 21ml/hr for next 24 hours and creat stable today at 2.5, will dc IVF's, creat bump may be contrast related as well  CAD s/p DES x2 June 2018 and DES March 2019 -Continue coreg , ASA, plavix, statin -Will need DAPT for at least 6 months, preferably 1 year   Aortic stenosis -S/p heart cath revealed mild-moderate AS with mean aortic valve gradient and aortic valve area 1.48 cm2  Charcot deformity of right foot with pressure ulcer -WOC consulted, following local wound care recommendations.  -Follow up with [facility]opedics -Per cardiology, likely not a surgical candidate next week. Will need DAPT for at least 6 months, preferably 1 year prior to any elective surgery   Insulin-dependent T2DM  -Lantus, SSI, tradjenta  Anemia of chronic disease -Received 1u PRBCs earlier this admission -Monitor CBC   Subclinical hypothyroidism -TSH 8.872, with normal free T4 1.02  -Suggest recheck labs in 4-6 weeks  Lower ext edema - probably some R heart failure associated with pulm HTN, still significant LE edema but rising creatinine precludes treating with further diuretics.  EOL - patient has signed DNR form at home, filled it out when at [facility] in [place] , and pt confirms with daughter present desire for DNR order.  Will order.  Vinson Moselle MD [facility] pgr [phone] 10/05/2017, 9:25 AM   DVT prophylaxis: Lovenox Code Status: Full Family Communication: Discussed with family at bedside Disposition Plan: Pending improvement in respiratory status, kidney function.  Plan for home health physical therapy on discharge   Consultants:    Cardiology   Procedures:   Heart cath 5/30  Antimicrobials:  Anti-infectives (From admission, onward)   None       Objective: Vitals:   10/12/17 2026 10/12/17 2052 10/13/17 0509 10/13/17 1211  BP: 138/63 (!) 143/65 (!) 125/48 127/60  Pulse:  70 62 62  Resp:  18 18 20   Temp:  98.2 F (36.8 C) 97.8 F (36.6 C) 97.7 F (36.5 C)  TempSrc:  Oral Oral Oral  SpO2:  100% 99% 100%  Weight:   70 kg (154 lb 4.8 oz)   Height:        Intake/Output Summary (Last 24 hours) at 10/13/2017 1520 Last data filed at 10/13/2017 0935 Gross per 24 hour  Intake 240 ml  Output 550 ml  Net -310 ml   Filed Weights   10/11/17 0100 10/12/17 0433 10/13/17 0509  Weight: 68.4 kg (150 lb 12.8 oz) 69.4 kg (152 lb 14.4 oz) 70 kg (154 lb 4.8 oz)    Examination: General exam: Appears calm and comfortable  Respiratory system: Clear to auscultation. Respiratory effort normal. Cardiovascular system: S1 & S2 heard, RRR. No JVD, murmurs, rubs, gallops or clicks. No pedal edema. Gastrointestinal system: Abdomen is nondistended, soft and nontender. No organomegaly or masses felt. Normal bowel sounds heard. Central nervous system: Alert and oriented. No focal neurological deficits. Extremities: +right foot deformity 2+ diffuse pitting edema bilat LE's lower and upper legs Skin: No rashes, lesions or ulcers Psychiatry: Judgement and insight appear normal. Mood & affect appropriate.    Data Reviewed: I have personally reviewed following labs and imaging studies  CBC: Recent Labs  Lab 10/08/17 0631 10/09/17 0430 10/09/17 1836 10/11/17 0533  WBC 7.7 9.0 8.7 7.2  HGB 9.0* 8.9* 8.3* 8.0*  HCT 30.0* 29.7* 28.2* 26.5*  MCV 84.7 83.9 85.2 85.2  PLT 259 272 241 245   Basic Metabolic Panel: Recent Labs  Lab 10/09/17 1240 10/09/17 1836 10/10/17 0425 10/11/17 0533 10/12/17 0646 10/13/17 0443  NA 142  --  137 138 139 138  K 3.8  --  3.9 4.0 3.8 3.9  CL 107  --  103 106 107 109  CO2 26  --  23 23  24 23   GLUCOSE 50*  --  99 190* 78 217*  BUN 41*  --  40* 47* 52* 56*  CREATININE 1.77* 1.82* 1.83* 2.00* 2.57* 2.57*  CALCIUM 8.9  --  8.5* 8.3* 8.4* 8.0*   GFR: Estimated Creatinine Clearance: 17.7 mL/min (A) (by C-G formula based on SCr of 2.57 mg/dL (H)). Liver Function Tests: No results for input(s): AST, ALT, ALKPHOS, BILITOT, PROT, ALBUMIN in the last 168 hours. No results for input(s): LIPASE, AMYLASE in the last 168 hours. No results for input(s): AMMONIA in the last 168 hours. Coagulation Profile: No results for input(s): INR, PROTIME in the last 168 hours. Cardiac Enzymes: No results for input(s): CKTOTAL, CKMB, CKMBINDEX, TROPONINI in the last 168 hours. BNP (last 3 results) No results for input(s): PROBNP in the last 8760 hours. HbA1C: No results for input(s): HGBA1C in the last 72 hours. CBG: Recent Labs  Lab 10/12/17 1141 10/12/17 1734 10/12/17 2044 10/13/17 0800 10/13/17 1124  GLUCAP 83 107* 200* 184*  145*   Lipid Profile: No results for input(s): CHOL, HDL, LDLCALC, TRIG, CHOLHDL, LDLDIRECT in the last 72 hours. Thyroid Function Tests: No results for input(s): TSH, T4TOTAL, FREET4, T3FREE, THYROIDAB in the last 72 hours. Anemia Panel: No results for input(s): VITAMINB12, FOLATE, FERRITIN, TIBC, IRON, RETICCTPCT in the last 72 hours. Sepsis Labs: No results for input(s): PROCALCITON, LATICACIDVEN in the last 168 hours.  No results found for this or any previous visit (from the past 240 hour(s)).     Radiology Studies: Dg Chest 2 View  Result Date: 10/12/2017 CLINICAL DATA:  Cough and shortness of breath EXAM: CHEST - 2 VIEW COMPARISON:  Oct 10, 2017 FINDINGS: Stable cardiomegaly. Improved pulmonary edema. Small effusions with underlying atelectasis. No other abnormalities. IMPRESSION: Cardiomegaly with small effusions and underlying atelectasis. Improved pulmonary edema. Electronically Signed   By: Gerome Sam III M.D   On: 10/12/2017 10:40       Scheduled Meds: . aspirin EC  81 mg Oral Daily  . atorvastatin  40 mg Oral q1800  . carvedilol  25 mg Oral BID WC  . clopidogrel  75 mg Oral Q breakfast  . cyclobenzaprine  10 mg Oral TID  . enoxaparin (LOVENOX) injection  30 mg Subcutaneous Daily  . hydrALAZINE  50 mg Oral Q8H  . insulin aspart  0-9 Units Subcutaneous TID WC  . insulin glargine  15 Units Subcutaneous QHS  . isosorbide mononitrate  30 mg Oral Daily  . linagliptin  5 mg Oral Daily  . loratadine  10 mg Oral Daily  . pantoprazole  40 mg Oral Daily  . sodium chloride flush  3 mL Intravenous Q12H   Continuous Infusions: . sodium chloride       LOS: 9 days

## 2017-10-13 NOTE — Progress Notes (Signed)
Patient had a episode of confusion. Called out for john(her husband) quickly realized he was not here. And asked for pain medication for headache also have PRN delsym for congestion.

## 2017-10-13 NOTE — Progress Notes (Signed)
Physical Therapy Treatment Patient Details Name: Emily Miranda MRN: [id number] DOB: 08/22/1940 Today's Date: 10/13/2017    History of Present Illness Pt is a 77 y/o female with a PMH significant for chronic combined systolic and diastolic CHF last EF measured in March 2018 was 40 to 45%, recent cardiac cath with stent placement, IDDM, CKDIII, hypertension and charcot foot on the right side plan to have right AKA at [facility] end of June 2019. Pt presents with sudden onset of SOB. She was found to be in acute respiratory failure with hypoxia. She initially required BiPAP and transitioned to [place] supplemental O2 2L/min.     PT Comments    Pt agreeable to get up OOB to the chair today. Pt c/o of L radiating shoulder pain from the neck down her arm to her hand. Pt did use L UE during transfer to EOB and then OOB to chair. Pt con't to require max encouragement to participate in therapy and OOB mobility. Acute PT to con't to follow.    Follow Up Recommendations  Home health PT;Supervision/Assistance - 24 hour     Equipment Recommendations  None recommended by PT    Recommendations for Other Services       Precautions / Restrictions Precautions Precautions: Fall Precaution Comments: R foot deformity Restrictions Weight Bearing Restrictions: No    Mobility  Bed Mobility Overal bed mobility: Needs Assistance Bed Mobility: Supine to Sit     Supine to sit: Supervision;Mod assist     General bed mobility comments: pt initiated LE movement to L side of bed, pt used bilat UE to pull up on bed rail and PT to bring self to EOB, maxA via pad to scoot hips to EOB  Transfers Overall transfer level: Needs assistance Equipment used: (2 person lift with gait belt and bed pad) Transfers: Sit to/from [employer] Transfers Sit to Stand: Min assist Stand pivot transfers: Min assist       General transfer comment: bilat feet blocked to prevent slipping, pt able to power up with difficulty  and step toward chair during stand pivot  Ambulation/Gait             General Gait Details: deferred today due to "I get lighted headed when I stand up"   Stairs             Wheelchair Mobility    Modified Rankin (Stroke Patients Only)       Balance Overall balance assessment: Needs assistance Sitting-balance support: Feet supported;No upper extremity supported Sitting balance-Leahy Scale: Fair     Standing balance support: Bilateral upper extremity supported;During functional activity Standing balance-Leahy Scale: Poor Standing balance comment: dependent on physical assist                            Cognition Arousal/Alertness: Awake/alert Behavior During Therapy: WFL for tasks assessed/performed Overall Cognitive Status: Within Functional Limits for tasks assessed                                 General Comments: pt with depressed spirits, can be self limiting, minimal motivation      Exercises General Exercises - Lower Extremity Long Arc Quad: AROM;Both;10 reps;Seated(with 5 sec hold at top)    General Comments        Pertinent Vitals/Pain Pain Assessment: 0-10 Pain Score: 8  Pain Location: L shoulder, radiating from neck down  the arm Pain Descriptors / Indicators: Sharp Pain Intervention(s): Monitored during session    Home Living                      Prior Function            PT Goals (current goals can now be found in the care plan section) Acute Rehab PT Goals Patient Stated Goal: stop my shoulder pain Progress towards PT goals: Progressing toward goals    Frequency    Min 3X/week      PT Plan Current plan remains appropriate    Co-evaluation              AM-PAC PT "6 Clicks" Daily Activity  Outcome Measure  Difficulty turning over in bed (including adjusting bedclothes, sheets and blankets)?: None Difficulty moving from lying on back to sitting on the side of the bed? :  Unable Difficulty sitting down on and standing up from a chair with arms (e.g., wheelchair, bedside commode, etc,.)?: Unable Help needed moving to and from a bed to chair (including a wheelchair)?: A Lot Help needed walking in hospital room?: A Lot Help needed climbing 3-5 steps with a railing? : A Lot 6 Click Score: 12    End of Session Equipment Utilized During Treatment: Gait belt Activity Tolerance: Patient limited by pain Patient left: in bed;with call bell/phone within reach;with family/visitor present Nurse Communication: Mobility status PT Visit Diagnosis: Unsteadiness on feet (R26.81);Pain;Muscle weakness (generalized) (M62.81);Repeated falls (R29.6);Difficulty in walking, not elsewhere classified (R26.2) Pain - Right/Left: Left Pain - part of body: Shoulder     Time: 1610-9604 PT Time Calculation (min) (ACUTE ONLY): 29 min  Charges:  $Therapeutic Exercise: 8-22 mins $Therapeutic Activity: 8-22 mins                    G Codes:       Lewis Shock, PT, DPT Pager #: [phone] Office #: [phone]    Emily Miranda M Emily Miranda 10/13/2017, 12:50 PM

## 2017-10-13 NOTE — Progress Notes (Addendum)
Progress Note  Patient Name: Emily Miranda Date of Encounter: 10/13/2017  Primary Cardiologist: Rollene Rotunda, MD   Subjective   Dyspnea has been improved. Continues to have L shoulder pain. CXR showed improved edema yesterday. Work up per primary team.   Inpatient Medications    Scheduled Meds: . aspirin EC  81 mg Oral Daily  . atorvastatin  40 mg Oral q1800  . carvedilol  25 mg Oral BID WC  . clopidogrel  75 mg Oral Q breakfast  . cyclobenzaprine  10 mg Oral TID  . enoxaparin (LOVENOX) injection  30 mg Subcutaneous Daily  . hydrALAZINE  50 mg Oral Q8H  . insulin aspart  0-9 Units Subcutaneous TID WC  . insulin glargine  15 Units Subcutaneous QHS  . isosorbide mononitrate  30 mg Oral Daily  . linagliptin  5 mg Oral Daily  . loratadine  10 mg Oral Daily  . pantoprazole  40 mg Oral Daily  . sodium chloride flush  3 mL Intravenous Q12H   Continuous Infusions: . sodium chloride     PRN Meds: sodium chloride, acetaminophen **OR** acetaminophen, dextromethorphan, fluticasone, morphine injection, nitroGLYCERIN, ondansetron **OR** ondansetron (ZOFRAN) IV, polyethylene glycol, sodium chloride flush, traMADol   Vital Signs    Vitals:   10/12/17 1150 10/12/17 2026 10/12/17 2052 10/13/17 0509  BP: 128/60 138/63 (!) 143/65 (!) 125/48  Pulse: 63  70 62  Resp: 18  18 18   Temp: 98 F (36.7 C)  98.2 F (36.8 C) 97.8 F (36.6 C)  TempSrc: Oral  Oral Oral  SpO2: 100%  100% 99%  Weight:    154 lb 4.8 oz (70 kg)  Height:        Intake/Output Summary (Last 24 hours) at 10/13/2017 0959 Last data filed at 10/13/2017 0935 Gross per 24 hour  Intake 506.67 ml  Output 550 ml  Net -43.33 ml   Filed Weights   10/11/17 0100 10/12/17 0433 10/13/17 0509  Weight: 150 lb 12.8 oz (68.4 kg) 152 lb 14.4 oz (69.4 kg) 154 lb 4.8 oz (70 kg)    Telemetry    SR with PACs- Personally Reviewed  ECG    N/A  Physical Exam   GEN: Thin frail female in no acute distress.   Neck: No  JVD Cardiac: RRR, no murmurs, rubs, or gallops.  Respiratory: Clear to auscultation bilaterally. GI: Soft, nontender, non-distended  MS: No edema, dressing on bilateral legs Neuro:  Nonfocal  Psych: Normal affect   Labs    Chemistry Recent Labs  Lab 10/11/17 0533 10/12/17 0646 10/13/17 0443  NA 138 139 138  K 4.0 3.8 3.9  CL 106 107 109  CO2 23 24 23   GLUCOSE 190* 78 217*  BUN 47* 52* 56*  CREATININE 2.00* 2.57* 2.57*  CALCIUM 8.3* 8.4* 8.0*  GFRNONAA 23* 17* 17*  GFRAA 27* 20* 20*  ANIONGAP 9 8 6      Hematology Recent Labs  Lab 10/09/17 0430 10/09/17 1836 10/11/17 0533  WBC 9.0 8.7 7.2  RBC 3.54* 3.31* 3.11*  HGB 8.9* 8.3* 8.0*  HCT 29.7* 28.2* 26.5*  MCV 83.9 85.2 85.2  MCH 25.1* 25.1* 25.7*  MCHC 30.0 29.4* 30.2  RDW 16.3* 16.3* 16.6*  PLT 272 241 245   BNP Recent Labs  Lab 10/09/17 1240  BNP 4,427.6*    DDimer  Recent Labs  Lab 10/08/17 1423  DDIMER 3.09*     Radiology    Dg Chest 2 View  Result Date: 10/12/2017 CLINICAL  DATA:  Cough and shortness of breath EXAM: CHEST - 2 VIEW COMPARISON:  Oct 10, 2017 FINDINGS: Stable cardiomegaly. Improved pulmonary edema. Small effusions with underlying atelectasis. No other abnormalities. IMPRESSION: Cardiomegaly with small effusions and underlying atelectasis. Improved pulmonary edema. Electronically Signed   By: Gerome Sam III M.D   On: 10/12/2017 10:40    Cardiac Studies   Cardiac Cath 09/2017  Conclusion    Moderate pulmonary hypertension -mean PA pressure 35 mmHg.  Diffuse three-vessel coronary disease with widely patent stents.  Left main is widely patent  Mid LAD contains a long region of stent which is widely patent. Proximal and distal to the stent there is diffuse coronary disease with up to 70% narrowing. This vessel is significantly improved since the pre-PCI comparison from June 2018.  Widely patent stent in the proximal to mid circumflex. Moderate diffuse disease is noted  beyond the stent.  Widely patent stent in the mid RCA and in the proximal portion of the PDA. Diffuse disease in the distal RCA improved compared to prior angiogram June 2018.  Normal left ventricular end-diastolic pressure.  Mild to moderate gradient across the aortic valve with calculated aortic valve area 1.48 cm  RECOMMENDATIONS:   Further management per treating team.   2D echo 10/07/2017 Study Conclusions  - Left ventricle: The cavity size was normal. There was mild concentric hypertrophy. Systolic function was mildly reduced. The estimated ejection fraction was in the range of 45% to 50%. Hypokinesis of the basal-midinferoseptal myocardium. Doppler parameters are consistent with abnormal left ventricular relaxation (grade 1 diastolic dysfunction). Doppler parameters are consistent with high ventricular filling pressure. - Aortic valve: There was moderate stenosis. There was moderate regurgitation. Peak velocity (S): 365 cm/s. Mean gradient (S): 26 mm Hg. Valve area (VTI): 0.85 cm^2. Valve area (Vmax): 0.84 cm^2. Valve area (Vmean): 0.81 cm^2. Regurgitation pressure half-time: 379 ms. - Mitral valve: Transvalvular velocity was within the normal range. There was no evidence for stenosis. There was mild regurgitation. - Left atrium: The atrium was mildly dilated. - Right ventricle: The cavity size was normal. Wall thickness was normal. Systolic function was normal. - Right atrium: The atrium was severely dilated. - Tricuspid valve: There was mild regurgitation. - Pulmonary arteries: Systolic pressure was severely increased. PA peak pressure: 60 mm Hg (S).  Patient Profile     77 y.o. female with a hx of chronic combined diastolic and systolic CHF (LVEF 40 to 45%), mild to moderate aortic stenosis, CAD with prior DES to RCA(10/2016) and LAD and LFX (07/2017),CKD stage III, DM 2,right footcharcot'sdeformity(plans for AKA at [facility]  anemiawho is consulted for the evaluation ofacute on chronic CHFand worsening aortic stenosis per echo.  Assessment & Plan    1. Acute on chronic systolic diastolic heart failure: - BNP > 4000 on admission. Echo this admission showed stable LVEF 45 to 50% however, there is now evidence of basal mid inferior septal hypokinesis, grade 1 DD, severe RAE, mild LAE and dilated/blunted IVF as well as advancement of aortic stenosis -Left heart caththis admitshowed patent coronary stents with 65% stenosis of the proximal to mid LAD and 70% stenosis distal to the mid LAD stent, 60% distal left circumflex -Right heart cath showed moderate pulmonary hypertension with PASP 49 mmHg. PCWP11 mmHg and LVEDP 8 mmHg. Appreared euvolemic. - Given gentle hydration yesterday due to worsening renal function. Creatinine stable today to 2.5.  -Diuretics and ACE inhibitor on hold -Continue hydralazine, Imdur,carvedilol  2. Aortic stenosis: -Echo this admission showed  worsen stenosis.  -Cardiac cath showed mild to moderate aortic stenosis with mean aortic valve gradient 24 mmHg and aortic valve area calculated 1.48 cm.  3. Acute on chronic CKD stage III: - likely related to recent cardiac cath (Baseline creatinine approximately 1.5_ -Diuretics and ACE inhibitor on hold - Renal function stable today. Continue to follow   4. Chest pain with history of CAD status post recent PCI with DES to proximal PDA/mid RCA/LAD (2018/2019): -Patient with recurrent chest pain intermittently s/p recent PCI approximately 3 months ago -Troponin, 0.06>0.05>0.05 -D-dimer elevated at 3 but no evidence of PE on VQ scan -Cardiac catheterization showed diffuse three-vessel coronary disease with widely patent stents. Mid LAD stent was widely patent with Diffuse coronary disease up to 70% proximally and distally to the stent but improved from pre-PCI in June 2018. Moderate diffuse disease beyond the patent stent in the  proximal to mid left circumflex and diffuse disease in the distal RCA with widely patent stent in the mid RCA. -Continue medical management with ASA, Plavix, beta-blocker, long acting nitrate and statin  5. HTN: -BPcontrolled on current medications  6.PAD: -Severe deformity of right foot with planned right AKA at [facility] next week -Likely patient not currently a surgical candidate next week secondary to current medical status as well as recent PCI in March. She needs to be on DAPT for at least 6 months and preferably 1 year prior to coming off for any elective surgery  7. DM2: -Uncontrolled, last hemoglobin A1c 05/29/2017, 11.8 - Per primary team  8. Elevated d-dimer: -D-dimer, 3.09 -VQ scan with no evidence of PE  9.  Severe left shoulder pain - CXR yesterday showed improved pulmonary edema. No underlying pneumonia.  - Further work up per primary team. Patient having quite discomfort due to this.    For questions or updates, please contact [facility] Please consult [email or web address] for contact info under Cardiology/STEMI.      SignedManson Passey, PA  10/13/2017, 9:59 AM    Attending Note:   The patient was seen and examined.  Agree with assessment and plan as noted above.  Changes made to the above note as needed.  Patient seen and independently examined with Chelsea Aus, PA .   We discussed all aspects of the encounter. I agree with the assessment and plan as stated above.  1.  Pulmonary HTN:   The patient has significant pulmonary vascular HTN From [facility]:   Mean PA pressure = 33 Mean PCWP = 11 CO = 6 3.7 [place] units   Will review with Dr. Gala Romney She may benefit from a pulmonary vasodilator    2.  Acute on chronic systolic and diastolic congestive heart failure: Her left ventricular systolic function is mild mildly reduced with an EF of 45 to 50%.  She has diuresed but her creatinine continues to increase.  3.  Aortic stenosis: Patient has moderate  aortic stenosis.   I have spent a total of 40 minutes with patient reviewing hospital  notes , telemetry, EKGs, labs and examining patient as well as establishing an assessment and plan that was discussed with the patient. > 50% of time was spent in direct patient care.   Vesta Mixer, Montez Hageman., MD, [facility] 10/13/2017, 2:17 PM [address]  [address] Office [phone] Pager [phone]

## 2017-10-14 LAB — GLUCOSE, CAPILLARY
Glucose-Capillary: 225 mg/dL — ABNORMAL HIGH (ref 65–99)
Glucose-Capillary: 171 mg/dL — ABNORMAL HIGH (ref 65–99)
Glucose-Capillary: 134 mg/dL — ABNORMAL HIGH (ref 65–99)
Glucose-Capillary: 157 mg/dL — ABNORMAL HIGH (ref 65–99)
Glucose-Capillary: 139 mg/dL — ABNORMAL HIGH (ref 65–99)

## 2017-10-14 LAB — BASIC METABOLIC PANEL
Anion gap: 8 (ref 5–15)
BUN: 57 mg/dL — ABNORMAL HIGH (ref 6–20)
CO2: 24 mmol/L (ref 22–32)
Calcium: 8.3 mg/dL — ABNORMAL LOW (ref 8.9–10.3)
Chloride: 107 mmol/L (ref 101–111)
Creatinine, Ser: 2.55 mg/dL — ABNORMAL HIGH (ref 0.44–1.00)
GFR calc Af Amer: 20 mL/min — ABNORMAL LOW (ref >60)
GFR calc non Af Amer: 17 mL/min — ABNORMAL LOW (ref >60)
Glucose, Bld: 202 mg/dL — ABNORMAL HIGH (ref 65–99)
Potassium: 4 mmol/L (ref 3.5–5.1)
Sodium: 139 mmol/L (ref 135–145)

## 2017-10-14 NOTE — Progress Notes (Signed)
PROGRESS NOTE    Emily Miranda  ZOX:[id number] DOB: 1941/01/04 DOA: 10/04/2017 PCP: Noni Saupe, MD     Brief Narrative:  Emily Miranda is a 77 y.o. female with a history of chronic combined CHF, AS, CAD s/p PCI, T2DM, stage III CKD, anemia, HTN and right foot charcot's deformity (with plans for AKA at [facility]) who presented with 1 day of abrupt onset dyspnea. She was found to be hypoxic requiring BiPAP, transitioned to nasal cannula. Diuresis was started with improvement and CXR f/u showed improved edema, although she still requires oxygen despite weight being down below previous DC weight. Echocardiogram demonstrated stable EF 45-50%, basal midinferoseptal hypokinesis, G1DD, severe RAE, mild LAE, and dilated/blunted IVF as well as advancement of aortic stenosis. She underwent heart cath 5/30 showed patent coronary stents, moderate pulm HTN. Hospitalization further complicated by AKI.   New events last 24 hours / Subjective: Seen by cardiology, creat stable, may benefit from vasodilator for pulm HTN per cards notes  Assessment & Plan:   Principal Problem:   Acute on chronic combined systolic and diastolic HF (heart failure) (HCC) Active Problems:   DM type 2 causing CKD stage 3 (HCC)   Mild aortic stenosis   Hypertensive cardiomyopathy, with heart failure (HCC)   Acute combined systolic and diastolic congestive heart failure (HCC)   PVD (peripheral vascular disease) (HCC)   CAD S/P percutaneous coronary angioplasty   Charcot foot due to diabetes mellitus (HCC)   Unstable angina (HCC)   Acute kidney injury (HCC)   CHF (congestive heart failure) (HCC)   Acute respiratory failure with hypoxia (HCC)   Pressure injury of skin   DCM (dilated cardiomyopathy) (HCC)   Nonrheumatic aortic valve stenosis   Acute hypoxic respiratory failure: Due to CHF exacerbation -Continue [place] O2 as needed  Acute on chronic combined systolic and diastolic CHF  -S/p heart cath 8/11 showed  patent coronary stents, moderate pulm HTN  -Daily weights, I/O. Diuresed 164 > 150 lbs then started IVF's for creat bump >2.0, now stable at 2.5 and off of IVF's -Diuretics, ACE on hold due to renal function -Continue coreg, hydralazine, imdur --Cardiology following may benefit from pulmonary vasodilator for pulm HTN  AKI on stage IV CKD - progressive CKD over last few years w creat 1.5 in 2018 and creat 1.6- 1.9 in 2019 - suspect cardiorenal component for CKD/ AKI - may be contrast injury as well - creat stable at 2.5 today, baseline creat 1.5- 1.9 - spoke w/ pt and family, pt does not want any dialysis, she has had family and friends on dialysis and doesn't want to go through that; also would be a poor candidate given comorbidities and heart disease; family agrees   CAD s/p DES x2 June 2018 and DES March 2019  -Continue coreg , ASA, plavix, statin -Will need DAPT for at least 6 months, preferably 1 year   Aortic stenosis -S/p heart cath revealed mild-moderate AS with mean aortic valve gradient and aortic valve area 1.48 cm2  Charcot deformity of right foot with pressure ulcer -WOC consulted, following local wound care recommendations.  -Follow up with [facility] orthopedics - patient had surgery/ BKA scheduled next week at [facility], however per cardiology patient is not likely a candidate for surgery next week given her medical status as well as recent PCI in March of this year. Will need DAPT for at least 6 months, preferably 1 year prior to any elective surgery.    Insulin-dependent T2DM  -  Lantus, SSI, tradjenta  Anemia of chronic disease -Received 1u PRBCs earlier this admission -Monitor CBC   Subclinical hypothyroidism -TSH 8.872, with normal free T4 1.02  -Suggest recheck labs in 4-6 weeks  Lower ext edema - probably R heart failure associated with pulm HTN - still significant LE edema but rising creatinine precludes giving more diuretics.  EOL - patient has signed  DNR form at home, filled it out while in a SNF in [place] - confirmed with pt's family/ daughter/ husband - DNR order written   Vinson Moselle MD [facility] pgr [phone] 10/05/2017, 9:25 AM   DVT prophylaxis: Lovenox Code Status: Full Family Communication: Discussed with family at bedside Disposition Plan: Pending improvement in respiratory status, kidney function.  Plan for home health physical therapy on discharge   Consultants:   Cardiology   Procedures:   Heart cath 5/30  Antimicrobials:  Anti-infectives (From admission, onward)   None       Objective: Vitals:   10/13/17 2100 10/14/17 0330 10/14/17 0340 10/14/17 1131  BP: (!) 165/76  (!) 156/75 (!) 129/58  Pulse: 74  73 60  Resp: 20  18 12   Temp: 98.1 F (36.7 C)  98 F (36.7 C)   TempSrc: Oral  Oral   SpO2: 94%  100% 100%  Weight:  68.7 kg (151 lb 8 oz)    Height:        Intake/Output Summary (Last 24 hours) at 10/14/2017 1507 Last data filed at 10/14/2017 1300 Gross per 24 hour  Intake 360 ml  Output 800 ml  Net -440 ml   Filed Weights   10/12/17 0433 10/13/17 0509 10/14/17 0330  Weight: 69.4 kg (152 lb 14.4 oz) 70 kg (154 lb 4.8 oz) 68.7 kg (151 lb 8 oz)    Examination: General exam: Appears calm and comfortable  Respiratory system: Clear to auscultation. Respiratory effort normal. Cardiovascular system: S1 & S2 heard, RRR. No JVD, murmurs, rubs, gallops or clicks. No pedal edema. Gastrointestinal system: Abdomen is nondistended, soft and nontender. No organomegaly or masses felt. Normal bowel sounds heard. Central nervous system: Alert and oriented. No focal neurological deficits. Extremities: +right foot deformity 2+ diffuse pitting edema bilat LE's lower and upper legs Skin: No rashes, lesions or ulcers Psychiatry: Judgement and insight appear normal. Mood & affect appropriate.    Data Reviewed: I have personally reviewed following labs and imaging studies  CBC: Recent  Labs  Lab 10/08/17 0631 10/09/17 0430 10/09/17 1836 10/11/17 0533  WBC 7.7 9.0 8.7 7.2  HGB 9.0* 8.9* 8.3* 8.0*  HCT 30.0* 29.7* 28.2* 26.5*  MCV 84.7 83.9 85.2 85.2  PLT 259 272 241 245   Basic Metabolic Panel: Recent Labs  Lab 10/10/17 0425 10/11/17 0533 10/12/17 0646 10/13/17 0443 10/14/17 0536  NA 137 138 139 138 139  K 3.9 4.0 3.8 3.9 4.0  CL 103 106 107 109 107  CO2 23 23 24 23 24   GLUCOSE 99 190* 78 217* 202*  BUN 40* 47* 52* 56* 57*  CREATININE 1.83* 2.00* 2.57* 2.57* 2.55*  CALCIUM 8.5* 8.3* 8.4* 8.0* 8.3*   GFR: Estimated Creatinine Clearance: 17.8 mL/min (A) (by C-G formula based on SCr of 2.55 mg/dL (H)). Liver Function Tests: No results for input(s): AST, ALT, ALKPHOS, BILITOT, PROT, ALBUMIN in the last 168 hours. No results for input(s): LIPASE, AMYLASE in the last 168 hours. No results for input(s): AMMONIA in the last 168 hours. Coagulation Profile: No results for input(s): INR, PROTIME in  the last 168 hours. Cardiac Enzymes: No results for input(s): CKTOTAL, CKMB, CKMBINDEX, TROPONINI in the last 168 hours. BNP (last 3 results) No results for input(s): PROBNP in the last 8760 hours. HbA1C: No results for input(s): HGBA1C in the last 72 hours. CBG: Recent Labs  Lab 10/13/17 1730 10/13/17 2108 10/14/17 0312 10/14/17 0803 10/14/17 1124  GLUCAP 170* 181* 225* 171* 134*   Lipid Profile: No results for input(s): CHOL, HDL, LDLCALC, TRIG, CHOLHDL, LDLDIRECT in the last 72 hours. Thyroid Function Tests: No results for input(s): TSH, T4TOTAL, FREET4, T3FREE, THYROIDAB in the last 72 hours. Anemia Panel: No results for input(s): VITAMINB12, FOLATE, FERRITIN, TIBC, IRON, RETICCTPCT in the last 72 hours. Sepsis Labs: No results for input(s): PROCALCITON, LATICACIDVEN in the last 168 hours.  No results found for this or any previous visit (from the past 240 hour(s)).     Radiology Studies: No results found.    Scheduled Meds: . aspirin EC   81 mg Oral Daily  . atorvastatin  40 mg Oral q1800  . carvedilol  25 mg Oral BID WC  . clopidogrel  75 mg Oral Q breakfast  . cyclobenzaprine  10 mg Oral TID  . enoxaparin (LOVENOX) injection  30 mg Subcutaneous Daily  . hydrALAZINE  50 mg Oral Q8H  . insulin aspart  0-9 Units Subcutaneous TID WC  . insulin glargine  15 Units Subcutaneous QHS  . isosorbide mononitrate  30 mg Oral Daily  . linagliptin  5 mg Oral Daily  . loratadine  10 mg Oral Daily  . pantoprazole  40 mg Oral Daily  . sodium chloride flush  3 mL Intravenous Q12H   Continuous Infusions: . sodium chloride       LOS: 10 days

## 2017-10-14 NOTE — Progress Notes (Addendum)
Progress Note  Patient Name: Emily Miranda Date of Encounter: 10/14/2017  Primary Cardiologist: Rollene Rotunda, MD   Subjective   Continues to have improvement in breathing - feels back to baseline. Reported an episode of chest discomfort yesterday when nurse aids repositioned her in bed. Reported an episode of choking this AM while eating breakfast. Also with some nose bleeding which she attributes to O2 via [place]. Denies chest pain this morning or palpitations.   Inpatient Medications    Scheduled Meds: . aspirin EC  81 mg Oral Daily  . atorvastatin  40 mg Oral q1800  . carvedilol  25 mg Oral BID WC  . clopidogrel  75 mg Oral Q breakfast  . cyclobenzaprine  10 mg Oral TID  . enoxaparin (LOVENOX) injection  30 mg Subcutaneous Daily  . hydrALAZINE  50 mg Oral Q8H  . insulin aspart  0-9 Units Subcutaneous TID WC  . insulin glargine  15 Units Subcutaneous QHS  . isosorbide mononitrate  30 mg Oral Daily  . linagliptin  5 mg Oral Daily  . loratadine  10 mg Oral Daily  . pantoprazole  40 mg Oral Daily  . sodium chloride flush  3 mL Intravenous Q12H   Continuous Infusions: . sodium chloride     PRN Meds: sodium chloride, acetaminophen **OR** acetaminophen, dextromethorphan, fluticasone, morphine injection, nitroGLYCERIN, ondansetron **OR** ondansetron (ZOFRAN) IV, polyethylene glycol, sodium chloride flush, traMADol   Vital Signs    Vitals:   10/13/17 1211 10/13/17 2100 10/14/17 0330 10/14/17 0340  BP: 127/60 (!) 165/76  (!) 156/75  Pulse: 62 74  73  Resp: 20 20  18   Temp: 97.7 F (36.5 C) 98.1 F (36.7 C)  98 F (36.7 C)  TempSrc: Oral Oral  Oral  SpO2: 100% 94%  100%  Weight:   151 lb 8 oz (68.7 kg)   Height:        Intake/Output Summary (Last 24 hours) at 10/14/2017 0958 Last data filed at 10/14/2017 0600 Gross per 24 hour  Intake 120 ml  Output 200 ml  Net -80 ml   Filed Weights   10/12/17 0433 10/13/17 0509 10/14/17 0330  Weight: 152 lb 14.4 oz (69.4 kg) 154  lb 4.8 oz (70 kg) 151 lb 8 oz (68.7 kg)    Telemetry    Sinus rhythm with 1st degree AV block - Personally Reviewed  Physical Exam   GEN: Elderly lady sitting in bedside chair in no acute distress.   Neck: No JVD, no carotid bruits Cardiac: RRR, + murmurs, no rubs or gallops.  Respiratory: decreased breath sounds at lung bases with mild L basilar crackles; no wheezing or rhonchi GI: NABS, Soft, nontender, non-distended  MS: trace edema RLE; B/L LE dressings in place. Severe deformity of R foot. Neuro:  Nonfocal, moving all extremities spontaneously Psych: Normal affect   Labs    Chemistry Recent Labs  Lab 10/12/17 0646 10/13/17 0443 10/14/17 0536  NA 139 138 139  K 3.8 3.9 4.0  CL 107 109 107  CO2 24 23 24   GLUCOSE 78 217* 202*  BUN 52* 56* 57*  CREATININE 2.57* 2.57* 2.55*  CALCIUM 8.4* 8.0* 8.3*  GFRNONAA 17* 17* 17*  GFRAA 20* 20* 20*  ANIONGAP 8 6 8      Hematology Recent Labs  Lab 10/09/17 0430 10/09/17 1836 10/11/17 0533  WBC 9.0 8.7 7.2  RBC 3.54* 3.31* 3.11*  HGB 8.9* 8.3* 8.0*  HCT 29.7* 28.2* 26.5*  MCV 83.9 85.2 85.2  MCH 25.1* 25.1* 25.7*  MCHC 30.0 29.4* 30.2  RDW 16.3* 16.3* 16.6*  PLT 272 241 245    Cardiac EnzymesNo results for input(s): TROPONINI in the last 168 hours. No results for input(s): TROPIPOC in the last 168 hours.   BNP Recent Labs  Lab 10/09/17 1240  BNP 4,427.6*     DDimer  Recent Labs  Lab 10/08/17 1423  DDIMER 3.09*     Radiology    Dg Chest 2 View  Result Date: 10/12/2017 CLINICAL DATA:  Cough and shortness of breath EXAM: CHEST - 2 VIEW COMPARISON:  Oct 10, 2017 FINDINGS: Stable cardiomegaly. Improved pulmonary edema. Small effusions with underlying atelectasis. No other abnormalities. IMPRESSION: Cardiomegaly with small effusions and underlying atelectasis. Improved pulmonary edema. Electronically Signed   By: Gerome Sam III M.D   On: 10/12/2017 10:40    Cardiac Studies   Cardiac Cath 10/09/2017    Conclusion    Moderate pulmonary hypertension -mean PA pressure 35 mmHg.  Diffuse three-vessel coronary disease with widely patent stents.  Left main is widely patent  Mid LAD contains a long region of stent which is widely patent. Proximal and distal to the stent there is diffuse coronary disease with up to 70% narrowing. This vessel is significantly improved since the pre-PCI comparison from June 2018.  Widely patent stent in the proximal to mid circumflex. Moderate diffuse disease is noted beyond the stent.  Widely patent stent in the mid RCA and in the proximal portion of the PDA. Diffuse disease in the distal RCA improved compared to prior angiogram June 2018.  Normal left ventricular end-diastolic pressure.  Mild to moderate gradient across the aortic valve with calculated aortic valve area 1.48 cm  RECOMMENDATIONS:   Further management per treating team.   2D echo 10/07/2017 Study Conclusions  - Left ventricle: The cavity size was normal. There was mild concentric hypertrophy. Systolic function was mildly reduced. The estimated ejection fraction was in the range of 45% to 50%. Hypokinesis of the basal-midinferoseptal myocardium. Doppler parameters are consistent with abnormal left ventricular relaxation (grade 1 diastolic dysfunction). Doppler parameters are consistent with high ventricular filling pressure. - Aortic valve: There was moderate stenosis. There was moderate regurgitation. Peak velocity (S): 365 cm/s. Mean gradient (S): 26 mm Hg. Valve area (VTI): 0.85 cm^2. Valve area (Vmax): 0.84 cm^2. Valve area (Vmean): 0.81 cm^2. Regurgitation pressure half-time: 379 ms. - Mitral valve: Transvalvular velocity was within the normal range. There was no evidence for stenosis. There was mild regurgitation. - Left atrium: The atrium was mildly dilated. - Right ventricle: The cavity size was normal. Wall thickness was normal. Systolic  function was normal. - Right atrium: The atrium was severely dilated. - Tricuspid valve: There was mild regurgitation. - Pulmonary arteries: Systolic pressure was severely increased. PA peak pressure: 60 mm Hg (S).     Patient Profile     77 y.o. female with PMH of chronic combined CHF (EF 40-45%), mild-mod AS, CAD s/p PCI to RCA 10/2016 and LAD/ Cx 07/2017, HTN, DM type 2, CKD stage 3, amnemia, R charcot's deformity with plans for ADA at [facility], who presented with SOB. Cardiology following for acute on chronic CHF and worsening AS on echocardiogram.  Assessment & Plan    1. Acute on chronic combined CHF: p/w SOB. BNP >4000 on admission. Echo this admission with EF 45-50%, however new septal hypokinesis, G1DD, sever RAE, mild LAE, and advancement in AS. Diuresed with IV lasix initially. Discontinued given bump in Cr. She  underwent R/LHC which revealed patent stents, moderate PulmHTN, and euvolemic status. CXR 6/2 with improved pulmonary edema. - Continue to hold diuretics and ACEi in the setting of acute on chronic renal insufficiency - Continue hydralazine, imdur, and carvedilol  2. Aortic stenosis: noted to have worsening AS on echo this admission with mean gradient 26 mmHg. Noted to be mild-mod on RHC with mean gradient 24 mmHg - Continue routine monitoring of AS  3. Chest pain in patient with CAD s/p multiple PCI: trop trend flat 0.06>0.05>0.05. Ddimer elevated by VQ scan negative for PE. She underwent [facility] 10/09/17 which revealed diffuse 3 vessel CAD with widely patent stents. Mid LAD stent was widely patent with Diffuse coronary disease up to 70% proximally and distally to the stent but improved from pre-PCI in June 2018. Moderate diffuse disease beyond the patent stent in the proximal to mid left circumflex and diffuse disease in the distal RCA with widely patent stent in the mid RCA. Recommended for continued medical management.  - Continue ASA, plavix, statin, carvedilol, and  imdur  4. Pulm HTN: noted on [facility] 10/09/17 with mean PA pressure 33 mmHg. Dr. Elease Hashimoto to discuss management with Dr. Gala Romney.  - May benefit from pulmonary vasodilator  5. HTN: BP stable - Continue current regimen  6. PAD: severe deformity of the R foot (charcot's foot) with planned ADA and [facility] next week. Unfortunately she is likely not a candidate or upcoming procedure given recent PCI 07/2017 and need for DAPT x6 months, preferably 1 year prior to discontinuation for elective surgery.  - Recommend rescheduling surgery  7. Acute on chronic renal insufficiency stage 3: Cr 2.57>2.55 today (baseline 1.7).  - Continue to monitor closely - Continue to hold diuretic and ACEi  8. DM type 2: Last A1C 11.8 05/2017; goal <7 - Continue management per primary team.    For questions or updates, please contact [facility] Please consult [email or web address] for contact info under Cardiology/STEMI.      Signed, Beatriz Stallion, PA-C  10/14/2017, 9:58 AM   [phone]  Attending Note:   The patient was seen and examined.  Agree with assessment and plan as noted above.  Changes made to the above note as needed.  Patient seen and independently examined with Judy Pimple, PA .   We discussed all aspects of the encounter. I agree with the assessment and plan as stated above.  1.  Pulmonary HTN:   The patient has significant pulmonary vascular HTN From RHC:   Mean PA pressure = 33 Mean PCWP = 11 CO = 6 3.7 [place] units  Net diuresis is -9.5 liters so far this admission She has very poor breath sounds over the entire left lung field.  She appears to have bilateral atelectasis.  This may be contributing to her pulmonary hypertension.   2.  Aortic stenosis: The patient has moderate aortic stenosis with a mean aortic valve gradient of 24 mmHg.  3.  Essential hypertension: Pressure has generally been fairly well controlled.     I have spent a total of 40 minutes with patient reviewing  hospital  notes , telemetry, EKGs, labs and examining patient as well as establishing an assessment and plan that was discussed with the patient. > 50% of time was spent in direct patient care.    Vesta Mixer, Montez Hageman., MD, [facility] 10/14/2017, 10:58 AM [address],  [address] Office [phone] Pager [phone]

## 2017-10-14 NOTE — Care Management Important Message (Signed)
Important Message  Patient Details  Name: Emily Miranda MRN: [id number] Date of Birth: [birth year]   Medicare Important Message Given:  Yes    Vyla Pint P Shadiyah Wernli 10/14/2017, 3:10 PM

## 2017-10-14 NOTE — Progress Notes (Signed)
CM following for progression of care; request [facility] for [facility] services at discharge per CM note from 10/10/2017. Abelino Derrick [facility] [phone]

## 2017-10-14 NOTE — Progress Notes (Signed)
Physical Therapy Treatment Patient Details Name: Emily Miranda MRN: [id number] DOB: [birth year] Today's Date: 10/14/2017    History of Present Illness Pt is a 77 y/o female with a PMH significant for chronic combined systolic and diastolic CHF last EF measured in March 2018 was 40 to 45%, recent cardiac cath with stent placement, IDDM, CKDIII, hypertension and charcot foot on the right side plan to have right AKA at [facility] end of June 2019. Pt presents with sudden onset of SOB. She was found to be in acute respiratory failure with hypoxia. She initially required BiPAP and transitioned to [place] supplemental O2 2L/min.     PT Comments    Pt progressing well today. Pt engaging in conversation about her son and athletics. Pt ambulated 10' today with minAx2 with chair follow. Ambulation limited by pain in L UE and bilat LEs. Pt with noted R foot deformity. Acute PT to con't to follow.    Follow Up Recommendations  Home health PT;Supervision/Assistance - 24 hour     Equipment Recommendations  None recommended by PT    Recommendations for Other Services       Precautions / Restrictions Precautions Precautions: Fall Precaution Comments: R foot deformity Restrictions Weight Bearing Restrictions: No    Mobility  Bed Mobility Overal bed mobility: Needs Assistance Bed Mobility: Supine to Sit     Supine to sit: Min guard     General bed mobility comments: with HOB elevated pt able to bring self to R side of bed, used bilat UEs  Transfers Overall transfer level: Needs assistance Equipment used: None Transfers: Sit to/from Stand Sit to Stand: Min assist         General transfer comment: R foot blocked to prevent sliding, minA to steady pt during transition of hands from bed to RW  Ambulation/Gait Ambulation/Gait assistance: Min assist;+2 safety/equipment Ambulation Distance (Feet): 10 Feet Assistive device: Rolling walker (2 wheeled) Gait Pattern/deviations: Step-to  pattern;Decreased stride length;Decreased step length - right;Decreased stance time - right Gait velocity: slow Gait velocity interpretation: <1.8 ft/sec, indicate of risk for recurrent falls General Gait Details: with max encouragement pt amb to hallway 10' with RW and chair follow. mina for walker management and to off weight R LE due to deformity   Stairs             Wheelchair Mobility    Modified Rankin (Stroke Patients Only)       Balance Overall balance assessment: Needs assistance Sitting-balance support: Feet supported;No upper extremity supported Sitting balance-Leahy Scale: Fair     Standing balance support: Bilateral upper extremity supported;During functional activity Standing balance-Leahy Scale: Poor Standing balance comment: dependent on physical assist                            Cognition Arousal/Alertness: Awake/alert Behavior During Therapy: Flat affect Overall Cognitive Status: Within Functional Limits for tasks assessed                                 General Comments: pt con't with depressed spirits but open to working with PT today      Exercises      General Comments        Pertinent Vitals/Pain Pain Assessment: 0-10 Pain Score: 8  Pain Location: L shoulder radiating from neck, bilat LE pain Pain Descriptors / Indicators: Sharp Pain Intervention(s): Monitored during session  Home Living                      Prior Function            PT Goals (current goals can now be found in the care plan section) Acute Rehab PT Goals Patient Stated Goal: stop the pain Progress towards PT goals: Progressing toward goals    Frequency    Min 3X/week      PT Plan Current plan remains appropriate    Co-evaluation              AM-PAC PT "6 Clicks" Daily Activity  Outcome Measure  Difficulty turning over in bed (including adjusting bedclothes, sheets and blankets)?: None Difficulty moving  from lying on back to sitting on the side of the bed? : A Little Difficulty sitting down on and standing up from a chair with arms (e.g., wheelchair, bedside commode, etc,.)?: A Little Help needed moving to and from a bed to chair (including a wheelchair)?: A Lot Help needed walking in hospital room?: A Lot Help needed climbing 3-5 steps with a railing? : A Lot 6 Click Score: 16    End of Session Equipment Utilized During Treatment: Gait belt Activity Tolerance: Patient limited by fatigue;Patient limited by pain Patient left: in chair;with call bell/phone within reach;with nursing/sitter in room Nurse Communication: Mobility status PT Visit Diagnosis: Unsteadiness on feet (R26.81);Pain;Muscle weakness (generalized) (M62.81);Repeated falls (R29.6);Difficulty in walking, not elsewhere classified (R26.2) Pain - Right/Left: Left Pain - part of body: Shoulder     Time: 1610-9604 PT Time Calculation (min) (ACUTE ONLY): 23 min  Charges:  $Gait Training: 8-22 mins $Therapeutic Activity: 8-22 mins                    G Codes:       Lewis Shock, PT, DPT Pager #: [phone] Office #: [phone]    Mckala Pantaleon M Hamlin Devine 10/14/2017, 10:31 AM

## 2017-10-15 ENCOUNTER — Other Ambulatory Visit: Payer: Self-pay

## 2017-10-15 ENCOUNTER — Telehealth: Payer: Self-pay | Admitting: Cardiology

## 2017-10-15 LAB — BASIC METABOLIC PANEL
Anion gap: 4 — ABNORMAL LOW (ref 5–15)
BUN: 57 mg/dL — ABNORMAL HIGH (ref 6–20)
CO2: 23 mmol/L (ref 22–32)
Calcium: 8.3 mg/dL — ABNORMAL LOW (ref 8.9–10.3)
Chloride: 113 mmol/L — ABNORMAL HIGH (ref 101–111)
Creatinine, Ser: 2.3 mg/dL — ABNORMAL HIGH (ref 0.44–1.00)
GFR calc Af Amer: 23 mL/min — ABNORMAL LOW (ref >60)
GFR calc non Af Amer: 20 mL/min — ABNORMAL LOW (ref >60)
Glucose, Bld: 116 mg/dL — ABNORMAL HIGH (ref 65–99)
Potassium: 4.4 mmol/L (ref 3.5–5.1)
Sodium: 140 mmol/L (ref 135–145)

## 2017-10-15 LAB — URINALYSIS, ROUTINE W REFLEX MICROSCOPIC
Bilirubin Urine: NEGATIVE
Glucose, UA: NEGATIVE mg/dL
Ketones, ur: NEGATIVE mg/dL
Nitrite: POSITIVE — AB
Protein, ur: 100 mg/dL — AB
Specific Gravity, Urine: 1.015 (ref 1.005–1.030)
pH: 6 (ref 5.0–8.0)

## 2017-10-15 LAB — URINALYSIS, MICROSCOPIC (REFLEX): WBC, UA: >50 WBC/hpf (ref 0–5)

## 2017-10-15 LAB — GLUCOSE, CAPILLARY
Glucose-Capillary: 113 mg/dL — ABNORMAL HIGH (ref 65–99)
Glucose-Capillary: 157 mg/dL — ABNORMAL HIGH (ref 65–99)
Glucose-Capillary: 160 mg/dL — ABNORMAL HIGH (ref 65–99)
Glucose-Capillary: 163 mg/dL — ABNORMAL HIGH (ref 65–99)
Glucose-Capillary: 164 mg/dL — ABNORMAL HIGH (ref 65–99)

## 2017-10-15 MED ORDER — CEPHALEXIN 250 MG PO CAPS
250.0000 mg | ORAL_CAPSULE | Freq: Two times a day (BID) | ORAL | Status: DC
  Administered 2017-10-15 – 2017-10-18 (×6): 250 mg via ORAL
  Filled 2017-10-15 (×7): qty 1

## 2017-10-15 NOTE — Progress Notes (Signed)
PROGRESS NOTE    Emily Miranda  ZOX:[id number] DOB: 08-14-1940 DOA: 10/04/2017 PCP: Noni Saupe, MD   Brief Narrative:  [name] a76 y.o.femalewith a history of chronic combined CHF, AS, CAD s/p PCI, T2DM, stage III CKD, anemia, HTN and right foot charcot's deformity (with plans for AKA at [facility]) who presented with 1 day of abrupt onset dyspnea. She was found to be hypoxic requiring BiPAP, transitioned to nasal cannula. Diuresis was started with improvement and CXR f/u showed improved edema, although she still requires oxygen despite weight being down below previous DC weight. Echocardiogram demonstrated stable EF 45-50%, basal midinferoseptal hypokinesis, G1DD, severe RAE, mild LAE, and dilated/blunted IVF as well as advancement of aortic stenosis. She underwent heart cath 5/30 showed patent coronary stents, moderate pulm HTN. Hospitalization further complicated by AKI.   Assessment & Plan:   Principal Problem:   Acute on chronic combined systolic and diastolic HF (heart failure) (HCC) Active Problems:   DM type 2 causing CKD stage 3 (HCC)   Mild aortic stenosis   Hypertensive cardiomyopathy, with heart failure (HCC)   Acute combined systolic and diastolic congestive heart failure (HCC)   PVD (peripheral vascular disease) (HCC)   CAD S/P percutaneous coronary angioplasty   Charcot foot due to diabetes mellitus (HCC)   Unstable angina (HCC)   Acute kidney injury (HCC)   CHF (congestive heart failure) (HCC)   Acute respiratory failure with hypoxia (HCC)   Pressure injury of skin   DCM (dilated cardiomyopathy) (HCC)   Nonrheumatic aortic valve stenosis   Acute hypoxic respiratory failure: Due to CHF exacerbation -Continue [place] O2 as needed -will likely need home O2 - PFT's ordered  Acute on chronic combined systolic and diastolic CHF  -S/p heart cath 8/11 showed patent coronary stents, moderate pulm HTN  - echo 5/28 with EF 45-50%, grade 1 diastolic  dysfunction, hypokinesis of basal-midinferoseptal myocardium -Daily weights, I/O. Diuresed 164 > 150 lbs then started IVF's for creat bump >2.0, now stable at 2.5 and off of IVF's -Diuretics, ACE on hold due to renal function -Continue coreg, hydralazine, imdur -Cardiology following may benefit from pulmonary vasodilator for pulm HTN (recommending PFT's, no other changes in therapy at this time)  Wt Readings from Last 3 Encounters:  10/15/17 69.3 kg (152 lb 11.2 oz)  09/04/17 69.3 kg (152 lb 11.2 oz)  08/28/17 72.6 kg (160 lb)    AKI on stage IV CKD - progressive CKD over last few years w creat 1.5 in 2018 and creat 1.6- 1.9 in 2019 - Peaked to 2.57.  Improving today to 2.3.  - suspect cardiorenal component for CKD/ AKI - maybe contrast injury as well - Previous provider spoke w/ pt and family, pt does not want any dialysis, she has had family and friends on dialysis and doesn't want to go through that; also would be a poor candidate given comorbidities and heart disease; family agrees  CAD s/p DES x2 June 2018 and DES March 2019  -Continue coreg , ASA, plavix, statin -Will need DAPT for at least 6 months, preferably 1 year   Aortic stenosis -S/p heart cath revealed mild-moderate AS with mean aortic valve gradient and aortic valve area 1.48 cm2  Charcot deformity of right foot with pressure ulcer - WOC consulted, following local wound care recommendations.  - Follow up with [facility] orthopedics - patient had surgery/ BKA scheduled next week at [facility], however per cardiology patient is not likely a candidate for surgery next week  given her medical status as well as recent PCI in March of this year. Will need DAPT for at least 6 months, preferably 1 year prior to any elective surgery.    Insulin-dependent T2DM  -Lantus, SSI, tradjenta  Anemia of chronic disease -Received 1u PRBCs earlier this admission -Monitor CBC   Subclinical hypothyroidism -TSH 8.872, with normal free  T4 1.02  -Suggest recheck labs in 4-6 weeks  Lower ext edema - probably R heart failure associated with pulm HTN - still significant LE edema but rising creatinine precludes giving more diuretics.  Pyuria  Dysuria: concern for UTI, sx of pressure, stinging, burning.  Suspect UTI.  Will start keflex. Reported she also has been I/O'd for urinary retention, will schedule bladder scans.  EOL - patient has signed DNR form at home, filled it out while in a SNF in [place] - confirmed with pt's family/ daughter/ husband - DNR order written  DVT prophylaxis: lovenox Code Status: DNR Family Communication: son and husband at bedside Disposition Plan: pending PFT's, discussed possibly tomorrow if renal function continuing to improve and cardiology input   Consultants:   cardiology  Procedures:  Right/Left Heart Cath  Moderate pulmonary hypertension -mean PA pressure 35 mmHg.  Diffuse three-vessel coronary disease with widely patent stents.  Left main is widely patent  Mid LAD contains a long region of stent which is widely patent.  Proximal and distal to the stent there is diffuse coronary disease with up to 70% narrowing.  This vessel is significantly improved since the pre-PCI comparison from June 2018.  Widely patent stent in the proximal to mid circumflex.  Moderate diffuse disease is noted beyond the stent.  Widely patent stent in the mid RCA and in the proximal portion of the PDA.  Diffuse disease in the distal RCA improved compared to prior angiogram June 2018.  Normal left ventricular end-diastolic pressure.  Mild to moderate gradient across the aortic valve with calculated aortic valve area 1.48 cm  RECOMMENDATIONS:   Further management per treating team.  Echo 5/28 Study Conclusions  - Left ventricle: The cavity size was normal. There was mild   concentric hypertrophy. Systolic function was mildly reduced. The   estimated ejection fraction was in the range of  45% to 50%.   Hypokinesis of the basal-midinferoseptal myocardium. Doppler   parameters are consistent with abnormal left ventricular   relaxation (grade 1 diastolic dysfunction). Doppler parameters   are consistent with high ventricular filling pressure. - Aortic valve: There was moderate stenosis. There was moderate   regurgitation. Peak velocity (S): 365 cm/s. Mean gradient (S): 26   mm Hg. Valve area (VTI): 0.85 cm^2. Valve area (Vmax): 0.84 cm^2.   Valve area (Vmean): 0.81 cm^2. Regurgitation pressure half-time:   379 ms. - Mitral valve: Transvalvular velocity was within the normal range.   There was no evidence for stenosis. There was mild regurgitation. - Left atrium: The atrium was mildly dilated. - Right ventricle: The cavity size was normal. Wall thickness was   normal. Systolic function was normal. - Right atrium: The atrium was severely dilated. - Tricuspid valve: There was mild regurgitation. - Pulmonary arteries: Systolic pressure was severely increased. PA   peak pressure: 60 mm Hg (S).   Antimicrobials:  Anti-infectives (From admission, onward)   Start     Dose/Rate Route Frequency Ordered Stop   10/15/17 2200  cephALEXin (KEFLEX) capsule 250 mg     250 mg Oral Every 12 hours 10/15/17 1715  Subjective: Notes UTI symptoms. Does not use O2 at home.  SOB better.   Objective: Vitals:   10/14/17 1131 10/14/17 2016 10/15/17 0506 10/15/17 1220  BP: (!) 129/58 (!) 149/71 (!) 151/73 (!) 145/66  Pulse: 60 66 (!) 50 72  Resp: 12 20 18 18   Temp:  97.8 F (36.6 C) 98.2 F (36.8 C) 97.6 F (36.4 C)  TempSrc:  Oral Oral Oral  SpO2: 100% 99% 96% 99%  Weight:   69.3 kg (152 lb 11.2 oz)   Height:        Intake/Output Summary (Last 24 hours) at 10/15/2017 1716 Last data filed at 10/15/2017 1052 Gross per 24 hour  Intake 240 ml  Output 1500 ml  Net -1260 ml   Filed Weights   10/13/17 0509 10/14/17 0330 10/15/17 0506  Weight: 70 kg (154 lb 4.8 oz) 68.7 kg (151  lb 8 oz) 69.3 kg (152 lb 11.2 oz)    Examination:  General exam: Appears calm and comfortable  Respiratory system: Clear to auscultation. Respiratory effort normal. Cardiovascular system: S1 & S2 heard, RRR. No JVD, murmurs, rubs, gallops or clicks. No pedal edema. Gastrointestinal system: Abdomen is nondistended, soft and nontender. No organomegaly or masses felt. Normal bowel sounds heard. Central nervous system: Alert and oriented. No focal neurological deficits. Extremities: RLE foot deformity.  Bilateral LE dressings in place.  Skin: several chronic LE wounds Psychiatry: Judgement and insight appear normal. Mood & affect appropriate.   Data Reviewed: I have personally reviewed following labs and imaging studies  CBC: Recent Labs  Lab 10/09/17 0430 10/09/17 1836 10/11/17 0533  WBC 9.0 8.7 7.2  HGB 8.9* 8.3* 8.0*  HCT 29.7* 28.2* 26.5*  MCV 83.9 85.2 85.2  PLT 272 241 245   Basic Metabolic Panel: Recent Labs  Lab 10/11/17 0533 10/12/17 0646 10/13/17 0443 10/14/17 0536 10/15/17 0532  NA 138 139 138 139 140  K 4.0 3.8 3.9 4.0 4.4  CL 106 107 109 107 113*  CO2 23 24 23 24 23   GLUCOSE 190* 78 217* 202* 116*  BUN 47* 52* 56* 57* 57*  CREATININE 2.00* 2.57* 2.57* 2.55* 2.30*  CALCIUM 8.3* 8.4* 8.0* 8.3* 8.3*   GFR: Estimated Creatinine Clearance: 19.8 mL/min (A) (by C-G formula based on SCr of 2.3 mg/dL (H)). Liver Function Tests: No results for input(s): AST, ALT, ALKPHOS, BILITOT, PROT, ALBUMIN in the last 168 hours. No results for input(s): LIPASE, AMYLASE in the last 168 hours. No results for input(s): AMMONIA in the last 168 hours. Coagulation Profile: No results for input(s): INR, PROTIME in the last 168 hours. Cardiac Enzymes: No results for input(s): CKTOTAL, CKMB, CKMBINDEX, TROPONINI in the last 168 hours. BNP (last 3 results) No results for input(s): PROBNP in the last 8760 hours. HbA1C: No results for input(s): HGBA1C in the last 72  hours. CBG: Recent Labs  Lab 10/14/17 1639 10/14/17 2048 10/15/17 0729 10/15/17 1218 10/15/17 1643  GLUCAP 157* 139* 113* 164* 157*   Lipid Profile: No results for input(s): CHOL, HDL, LDLCALC, TRIG, CHOLHDL, LDLDIRECT in the last 72 hours. Thyroid Function Tests: No results for input(s): TSH, T4TOTAL, FREET4, T3FREE, THYROIDAB in the last 72 hours. Anemia Panel: No results for input(s): VITAMINB12, FOLATE, FERRITIN, TIBC, IRON, RETICCTPCT in the last 72 hours. Sepsis Labs: No results for input(s): PROCALCITON, LATICACIDVEN in the last 168 hours.  No results found for this or any previous visit (from the past 240 hour(s)).       Radiology Studies: No results  found.      Scheduled Meds: . aspirin EC  81 mg Oral Daily  . atorvastatin  40 mg Oral q1800  . carvedilol  25 mg Oral BID WC  . cephALEXin  250 mg Oral Q12H  . clopidogrel  75 mg Oral Q breakfast  . cyclobenzaprine  10 mg Oral TID  . enoxaparin (LOVENOX) injection  30 mg Subcutaneous Daily  . hydrALAZINE  50 mg Oral Q8H  . insulin aspart  0-9 Units Subcutaneous TID WC  . insulin glargine  15 Units Subcutaneous QHS  . isosorbide mononitrate  30 mg Oral Daily  . linagliptin  5 mg Oral Daily  . loratadine  10 mg Oral Daily  . pantoprazole  40 mg Oral Daily  . sodium chloride flush  3 mL Intravenous Q12H   Continuous Infusions: . sodium chloride       LOS: 11 days    Time spent: over 30 min    Lacretia Nicks, MD [facility] Pager [phone]  If 7PM-7AM, please contact night-coverage [email or web address] Password [email or web address] 10/15/2017, 5:16 PM

## 2017-10-15 NOTE — Progress Notes (Signed)
Called daughter Derion Malach at [phone]. Concerned about timing of possible discharge. I had discussed possible discharge with pt and husband if kidney function continued to improve (no concerns expressed at the time, but sounds like husband was upset and concerned about this). Discussed with daughter who notes she's concerned in general about discharge with many frequent hospitalizations with improvement and then decline out of hospital.   She doesn't think her mother will function well at home independently.  Notes her father has memory problems as well and isn't able to provide 24 hour care for her.    Discussed overall situation and general plan. Acute Hypoxic Resp Failure - holding diuretics at this point.  Currently on O2, will likely need O2 at discharge.  Plan for PFTs tomorrow. Acute kidney injury - holding diuretics.  Kidney function improving.  Will eventually need to restart diuretic with HF, but will continue to hold diuresis with improving kidney function. PT/OT recommending home health with 24 hour supervision/assistance.  Daughter notes mom doesn't have 24 hour supervision/assistance at home.  Will touch base with PT and see if this changes recommendation, possibly to SNF. Will consult palliative care as well for more extensive goals of care conversation.  Daughter will ask nursing to page me tomorrow when she's at bedside.

## 2017-10-15 NOTE — Telephone Encounter (Signed)
New message      Pt is in the hospital.  Daughter states that pt is to be discharged tomorrow.  Daughter insist that she talk to the physician caring for her mother in the hospital.  She does not think this is a good idea to discharge her mother tomorrow.

## 2017-10-15 NOTE — Telephone Encounter (Signed)
Call placed to the daughter. She is not on the dpr list. She stated that she is highly upset that her mother is getting discharged tomorrow when she is no where ready to go home. She stated that this will be "the patient's death sentence." She has been advised to tell the nurse at the hospital of her concerns and that she is not listed on the dpr for this office. She asked that her father be called.  A call was placed to the patient's husband who is on the approved list. He stated that the patient is scheduled to be discharged tomorrow. He has been informed of his daughter's concern and that if he would like for a physician to talk to her then he should tell his wife's nurse and she can page the physician. He verbalized his understanding.

## 2017-10-15 NOTE — Consult Note (Signed)
   [facility] CM Inpatient Consult   10/15/2017  SRISTI OLLILA June 28, 1940 [id number]  This writer has been following patient progress.  Patient has in the past declined [facility] services for restart.  Patient is in [facility] Care Management in the [facility]. Went by to speak with the patient her family and PA were conferencing. Current disposition is recommended for [facility].   Please place a [facility] Care Management consult or for questions contact:   Charlesetta Shanks, RN BSN CCM [facility]  [phone] business mobile phone Toll free office [phone]

## 2017-10-15 NOTE — Progress Notes (Signed)
Physical Therapy Treatment Patient Details Name: Emily Miranda MRN: [id number] DOB: 05-Sep-1940 Today's Date: 10/15/2017    History of Present Illness Pt is a 77 y/o female with a PMH significant for chronic combined systolic and diastolic CHF last EF measured in March 2018 was 40 to 45%, recent cardiac cath with stent placement, IDDM, CKDIII, hypertension and charcot foot on the right side plan to have right AKA at [facility] end of June 2019. Pt presents with sudden onset of SOB. She was found to be in acute respiratory failure with hypoxia. She initially required BiPAP and transitioned to [place] supplemental O2 2L/min.     PT Comments    Pt continuing to progress well. Pt supervision with bed mobility and only required assist to block R foot from sliding during sit to stand transfer. Pt ambulated 10' to the door with increased fluidity and pace. Pt only ambulates into bathroom at home and is advised to minimize ambulation other than that due to R foot deformity and ulcer on bottom of foot. Pt with improved spirits as well. [place] the mobility tech will follow up with the patient the rest of the week. Spoke with daughter about patients progress as well. SpO2 did drop to 83% on RA while at rest. Acute PT to cont. To follow.   Follow Up Recommendations  Home health PT;Supervision/Assistance - 24 hour     Equipment Recommendations  None recommended by PT    Recommendations for Other Services       Precautions / Restrictions Precautions Precautions: Fall Precaution Comments: R foot deformity Restrictions Weight Bearing Restrictions: No    Mobility  Bed Mobility Overal bed mobility: Needs Assistance Bed Mobility: Supine to Sit     Supine to sit: Supervision     General bed mobility comments: HOB elevated, pt able to move LEs and bring self to EOB, using L UE without physical assist  Transfers Overall transfer level: Needs assistance Equipment used: None Transfers: Sit to/from  Stand Sit to Stand: Min assist         General transfer comment: R foot blocked to prevent sliding however pt able to power up and safety transition hands from bed to walker without physical assist  Ambulation/Gait Ambulation/Gait assistance: Min assist;+2 safety/equipment Ambulation Distance (Feet): 10 Feet Assistive device: Rolling walker (2 wheeled) Gait Pattern/deviations: Step-to pattern;Decreased stride length;Decreased step length - right;Decreased stance time - right Gait velocity: slow Gait velocity interpretation: 1.31 - 2.62 ft/sec, indicative of limited community ambulator General Gait Details: pt with improved step length and pace this date compared to yesterday.    Stairs             Wheelchair Mobility    Modified Rankin (Stroke Patients Only)       Balance Overall balance assessment: Needs assistance Sitting-balance support: No upper extremity supported;Feet supported Sitting balance-Leahy Scale: Good     Standing balance support: Bilateral upper extremity supported;During functional activity Standing balance-Leahy Scale: Poor Standing balance comment: dependent on physical assist                            Cognition Arousal/Alertness: Awake/alert Behavior During Therapy: WFL for tasks assessed/performed Overall Cognitive Status: Within Functional Limits for tasks assessed                                 General Comments: pt with improved spirits, had  visitors this AM.       Exercises      General Comments General comments (skin integrity, edema, etc.): bilat LEs with sores, dressed in bandages. SpO2 at 83% on RA at rest.       Pertinent Vitals/Pain Pain Assessment: 0-10 Pain Score: 8  Pain Location: L shoulder Radiating pain from neck, L LE pain Pain Descriptors / Indicators: Cramping;Crushing;Radiating;Throbbing Pain Intervention(s): Monitored during session    Home Living                       Prior Function            PT Goals (current goals can now be found in the care plan section) Acute Rehab PT Goals Patient Stated Goal: stop the L sided pain Progress towards PT goals: Progressing toward goals    Frequency    Min 3X/week      PT Plan Discharge plan needs to be updated    Co-evaluation              AM-PAC PT "6 Clicks" Daily Activity  Outcome Measure  Difficulty turning over in bed (including adjusting bedclothes, sheets and blankets)?: None Difficulty moving from lying on back to sitting on the side of the bed? : A Little Difficulty sitting down on and standing up from a chair with arms (e.g., wheelchair, bedside commode, etc,.)?: A Little Help needed moving to and from a bed to chair (including a wheelchair)?: A Little Help needed walking in hospital room?: A Little Help needed climbing 3-5 steps with a railing? : A Lot 6 Click Score: 18    End of Session Equipment Utilized During Treatment: Gait belt Activity Tolerance: Patient limited by fatigue;Patient limited by pain Patient left: in chair;with call bell/phone within reach;with nursing/sitter in room Nurse Communication: Mobility status PT Visit Diagnosis: Unsteadiness on feet (R26.81);Pain;Muscle weakness (generalized) (M62.81);Repeated falls (R29.6);Difficulty in walking, not elsewhere classified (R26.2) Pain - Right/Left: Left Pain - part of body: Shoulder     Time: 0927-1000 PT Time Calculation (min) (ACUTE ONLY): 33 min  Charges:  $Gait Training: 8-22 mins $Therapeutic Activity: 8-22 mins                    G Codes:       Lewis Shock, PT, DPT Pager #: [phone] Office #: [phone]    Takyah Ciaramitaro M Urbano Milhouse 10/15/2017, 10:18 AM

## 2017-10-15 NOTE — Progress Notes (Addendum)
Progress Note  Patient Name: Emily Miranda Date of Encounter: 10/15/2017  Primary Cardiologist: Rollene Rotunda, MD   Subjective   [facility] well with PT this morning. Breathing has been stable.   Inpatient Medications    Scheduled Meds: . aspirin EC  81 mg Oral Daily  . atorvastatin  40 mg Oral q1800  . carvedilol  25 mg Oral BID WC  . clopidogrel  75 mg Oral Q breakfast  . cyclobenzaprine  10 mg Oral TID  . enoxaparin (LOVENOX) injection  30 mg Subcutaneous Daily  . hydrALAZINE  50 mg Oral Q8H  . insulin aspart  0-9 Units Subcutaneous TID WC  . insulin glargine  15 Units Subcutaneous QHS  . isosorbide mononitrate  30 mg Oral Daily  . linagliptin  5 mg Oral Daily  . loratadine  10 mg Oral Daily  . pantoprazole  40 mg Oral Daily  . sodium chloride flush  3 mL Intravenous Q12H   Continuous Infusions: . sodium chloride     PRN Meds: sodium chloride, acetaminophen **OR** acetaminophen, dextromethorphan, fluticasone, morphine injection, nitroGLYCERIN, ondansetron **OR** ondansetron (ZOFRAN) IV, polyethylene glycol, sodium chloride flush, traMADol   Vital Signs    Vitals:   10/14/17 0340 10/14/17 1131 10/14/17 2016 10/15/17 0506  BP: (!) 156/75 (!) 129/58 (!) 149/71 (!) 151/73  Pulse: 73 60 66 (!) 50  Resp: 18 12 20 18   Temp: 98 F (36.7 C)  97.8 F (36.6 C) 98.2 F (36.8 C)  TempSrc: Oral  Oral Oral  SpO2: 100% 100% 99% 96%  Weight:    152 lb 11.2 oz (69.3 kg)  Height:        Intake/Output Summary (Last 24 hours) at 10/15/2017 1009 Last data filed at 10/15/2017 0509 Gross per 24 hour  Intake 240 ml  Output 1600 ml  Net -1360 ml   Filed Weights   10/13/17 0509 10/14/17 0330 10/15/17 0506  Weight: 154 lb 4.8 oz (70 kg) 151 lb 8 oz (68.7 kg) 152 lb 11.2 oz (69.3 kg)    Telemetry    SR  - Personally Reviewed  ECG  N/A  Physical Exam   GEN: Elderly lady sitting in bedside chair in no acute distress.   Neck: No JVD Cardiac: RRR, + murmurs, rubs, or  gallops.  Respiratory: decreased breath sounds at lung bases with mild basilar crackles; no wheezing or rhonchi GI: Soft, nontender, non-distended  MS: RLE; B/L LE dressings in place. Severe deformity of R foot. Neuro:  Nonfocal  Psych: Normal affect   Labs    Chemistry Recent Labs  Lab 10/13/17 0443 10/14/17 0536 10/15/17 0532  NA 138 139 140  K 3.9 4.0 4.4  CL 109 107 113*  CO2 23 24 23   GLUCOSE 217* 202* 116*  BUN 56* 57* 57*  CREATININE 2.57* 2.55* 2.30*  CALCIUM 8.0* 8.3* 8.3*  GFRNONAA 17* 17* 20*  GFRAA 20* 20* 23*  ANIONGAP 6 8 4*     Hematology Recent Labs  Lab 10/09/17 0430 10/09/17 1836 10/11/17 0533  WBC 9.0 8.7 7.2  RBC 3.54* 3.31* 3.11*  HGB 8.9* 8.3* 8.0*  HCT 29.7* 28.2* 26.5*  MCV 83.9 85.2 85.2  MCH 25.1* 25.1* 25.7*  MCHC 30.0 29.4* 30.2  RDW 16.3* 16.3* 16.6*  PLT 272 241 245   BNP Recent Labs  Lab 10/09/17 1240  BNP 4,427.6*     DDimer  Recent Labs  Lab 10/08/17 1423  DDIMER 3.09*     Radiology  No results found.  Cardiac Studies   Cardiac Cath 10/09/2017  Conclusion    Moderate pulmonary hypertension -mean PA pressure 35 mmHg.  Diffuse three-vessel coronary disease with widely patent stents.  Left main is widely patent  Mid LAD contains a long region of stent which is widely patent. Proximal and distal to the stent there is diffuse coronary disease with up to 70% narrowing. This vessel is significantly improved since the pre-PCI comparison from June 2018.  Widely patent stent in the proximal to mid circumflex. Moderate diffuse disease is noted beyond the stent.  Widely patent stent in the mid RCA and in the proximal portion of the PDA. Diffuse disease in the distal RCA improved compared to prior angiogram June 2018.  Normal left ventricular end-diastolic pressure.  Mild to moderate gradient across the aortic valve with calculated aortic valve area 1.48 cm  RECOMMENDATIONS:   Further management per  treating team.   2D echo 10/07/2017 Study Conclusions  - Left ventricle: The cavity size was normal. There was mild concentric hypertrophy. Systolic function was mildly reduced. The estimated ejection fraction was in the range of 45% to 50%. Hypokinesis of the basal-midinferoseptal myocardium. Doppler parameters are consistent with abnormal left ventricular relaxation (grade 1 diastolic dysfunction). Doppler parameters are consistent with high ventricular filling pressure. - Aortic valve: There was moderate stenosis. There was moderate regurgitation. Peak velocity (S): 365 cm/s. Mean gradient (S): 26 mm Hg. Valve area (VTI): 0.85 cm^2. Valve area (Vmax): 0.84 cm^2. Valve area (Vmean): 0.81 cm^2. Regurgitation pressure half-time: 379 ms. - Mitral valve: Transvalvular velocity was within the normal range. There was no evidence for stenosis. There was mild regurgitation. - Left atrium: The atrium was mildly dilated. - Right ventricle: The cavity size was normal. Wall thickness was normal. Systolic function was normal. - Right atrium: The atrium was severely dilated. - Tricuspid valve: There was mild regurgitation. - Pulmonary arteries: Systolic pressure was severely increased. PA peak pressure: 60 mm Hg (S).  Patient Profile     77 y.o. female with PMH of chronic combined CHF (EF 40-45%), mild-mod AS, CAD s/p PCI to RCA 10/2016 and LAD/ Cx 07/2017, HTN, DM type 2, CKD stage 3, amnemia, R charcot's deformity with plans for ADA at [facility], who presented with SOB. Cardiology following for acute on chronic CHF and worsening AS on echocardiogram.  Assessment & Plan    1. Acute on chronic combined CHF/pumonary HTN - Echo this admission with EF 45-50%, however new septal hypokinesis, G1DD, sever RAE, mild LAE, and advancement in AS.  She underwent R/LHC which revealed patent stents, moderate PulmHTN.  Mean PA pressure = 33 Mean PCWP = 11 CO = 6 3.7 [place] units  -  Net diuresis 10.8L. Currently on auto diuresis.  - Continue to hold diuretics and ACEi in the setting of acute on chronic renal insufficiency - Continue hydralazine, imdur, and carvedilol - Pulmonary vasodilator per MD. May be outpatient CHF evaluation.   2. Aortic stenosis -Noted to have worsening AS on echo this admission with mean gradient 26 mmHg. Noted to be mild-mod on RHC with mean gradient 24 mmHg - Continue routine monitoring of AS  3. Chest pain in patient with CAD s/p multiple PCI:  She underwent [facility] 10/09/17 which revealed diffuse 3 vessel CAD with widely patent stents. Mid LAD stent was widely patent with Diffuse coronary disease up to 70% proximally and distally to the stent but improved from pre-PCI in June 2018. Moderate diffuse disease beyond the patent  stent in the proximal to mid left circumflex and diffuse disease in the distal RCA with widely patent stent in the mid RCA. Recommended for continued medical management.  - Continue ASA, plavix, statin, carvedilol, and imdur  4. Acute on CKD Stage IV - Scr improved today. Patient and family does not want dialysis. She will benefits from outpatient nephrology evaluation for diuretics management.  Will sign off. Call with questions.   For questions or updates, please contact [facility] Please consult [email or web address] for contact info under Cardiology/STEMI.      SignedSharrell Ku [place], [place]  10/15/2017, 10:09 AM    Attending Note:   The patient was seen and examined.  Agree with assessment and plan as noted above.  Changes made to the above note as needed.  Patient seen and independently examined with Chelsea Aus, PA .   We discussed all aspects of the encounter. I agree with the assessment and plan as stated above.  1.   Pulmonary HTN:   Have discussed with Dr. Gala Romney.   She has mild PHTN.  He would not recommend any  Changes in therapy at this time. Will get PFTs   2.  Acute on chronic combined congestive  heart failure Feels better after diuresis. She is developed some slightly worsened renal function. Lasix is currently on hold   3.  Aortic stenosis: Moderate aortic stenosis by echo.  Continue current therapy.   I have spent a total of 40 minutes with patient reviewing hospital  notes , telemetry, EKGs, labs and examining patient as well as establishing an assessment and plan that was discussed with the patient. > 50% of time was spent in direct patient care.    Vesta Mixer, Montez Hageman., MD, [facility] 10/15/2017, 12:43 PM [address]  [address] Office [phone] Pager [phone]

## 2017-10-16 ENCOUNTER — Inpatient Hospital Stay (HOSPITAL_COMMUNITY): Payer: Medicare Other

## 2017-10-16 ENCOUNTER — Ambulatory Visit: Payer: Medicare Other | Admitting: Cardiology

## 2017-10-16 DIAGNOSIS — J810 Acute pulmonary edema: Secondary | ICD-10-CM

## 2017-10-16 DIAGNOSIS — N179 Acute kidney failure, unspecified: Secondary | ICD-10-CM

## 2017-10-16 DIAGNOSIS — Z515 Encounter for palliative care: Secondary | ICD-10-CM

## 2017-10-16 DIAGNOSIS — Z7189 Other specified counseling: Secondary | ICD-10-CM

## 2017-10-16 LAB — CBC
HCT: 27.2 % — ABNORMAL LOW (ref 36.0–46.0)
Hemoglobin: 8 g/dL — ABNORMAL LOW (ref 12.0–15.0)
MCH: 24.9 pg — ABNORMAL LOW (ref 26.0–34.0)
MCHC: 29.4 g/dL — ABNORMAL LOW (ref 30.0–36.0)
MCV: 84.7 fL (ref 78.0–100.0)
Platelets: 344 10*3/uL (ref 150–400)
RBC: 3.21 MIL/uL — ABNORMAL LOW (ref 3.87–5.11)
RDW: 16.4 % — ABNORMAL HIGH (ref 11.5–15.5)
WBC: 4.4 10*3/uL (ref 4.0–10.5)

## 2017-10-16 LAB — PULMONARY FUNCTION TEST
FEF 25-75 Pre: 1.98 L/s
FEF2575-%Pred-Pre: 114 %
FEV1-%Pred-Pre: 33 %
FEV1-Pre: 0.77 L
FEV1FVC-%Pred-Pre: 133 %
FEV6-%Pred-Pre: 26 %
FEV6-Pre: 0.77 L
FEV6FVC-%Pred-Pre: 105 %
FVC-%Pred-Pre: 25 %
FVC-Pre: 0.77 L
Pre FEV1/FVC ratio: 100 %
Pre FEV6/FVC Ratio: 100 %

## 2017-10-16 LAB — MAGNESIUM: Magnesium: 2.1 mg/dL (ref 1.7–2.4)

## 2017-10-16 LAB — BASIC METABOLIC PANEL WITH GFR
Anion gap: 4 — ABNORMAL LOW (ref 5–15)
BUN: 50 mg/dL — ABNORMAL HIGH (ref 6–20)
CO2: 23 mmol/L (ref 22–32)
Calcium: 8.4 mg/dL — ABNORMAL LOW (ref 8.9–10.3)
Chloride: 112 mmol/L — ABNORMAL HIGH (ref 101–111)
Creatinine, Ser: 1.95 mg/dL — ABNORMAL HIGH (ref 0.44–1.00)
GFR calc Af Amer: 28 mL/min — ABNORMAL LOW
GFR calc non Af Amer: 24 mL/min — ABNORMAL LOW
Glucose, Bld: 126 mg/dL — ABNORMAL HIGH (ref 65–99)
Potassium: 4.5 mmol/L (ref 3.5–5.1)
Sodium: 139 mmol/L (ref 135–145)

## 2017-10-16 LAB — GLUCOSE, CAPILLARY
Glucose-Capillary: 103 mg/dL — ABNORMAL HIGH (ref 65–99)
Glucose-Capillary: 134 mg/dL — ABNORMAL HIGH (ref 65–99)
Glucose-Capillary: 178 mg/dL — ABNORMAL HIGH (ref 65–99)
Glucose-Capillary: 170 mg/dL — ABNORMAL HIGH (ref 65–99)

## 2017-10-16 MED ORDER — FUROSEMIDE 40 MG PO TABS: 40.0000 mg | ORAL_TABLET | Freq: Every day | ORAL | Status: DC

## 2017-10-16 NOTE — Consult Note (Addendum)
   [facility] CM Inpatient Consult   10/16/2017  ALLYSIN Miranda August 03, 1940 [id number]   [facility] Care Management follow up.  Went to bedside to speak with [name] and family about [facility] Care Management re-engagement. She is agreeable and written consent obtained.  Explained [facility] Care Management will not interfere or replace services provided by home health.  [name] endorses her Primary Care MD is Dr. Jeanie Miranda at [facility] (practice is listed as doing transition of care calls). Denies concerns with medications or transportation. Lives with husband and has supportive family. Denies weighing daily due to being unable to stand alone for a period of time. Has a [facility] scale to weigh.  Also spoke with patient's daughter, per request, Emily Miranda at [phone] to explain [facility] Care Management services. She is also agreeable. Both Emily Miranda (daughter) and husband Emily Miranda are listed on [facility] Care Management consent for emergency contact.  Discussed [facility] Care Management follow up for CHF.   Also noted palliative consult is pending. Will make appropriate [facility] team referrals once disposition plans are confirmed.    Raiford Noble, MSN-Ed, RN,BSN [facility] Liaison [phone]

## 2017-10-16 NOTE — Plan of Care (Signed)
  Problem: Clinical Measurements: Goal: Diagnostic test results will improve Outcome: Progressing   Problem: Clinical Measurements: Goal: Cardiovascular complication will be avoided Outcome: Progressing   Problem: Activity: Goal: Risk for activity intolerance will decrease Outcome: Progressing   

## 2017-10-16 NOTE — Consult Note (Signed)
Consultation Note Date: 10/16/2017   Patient Name: Emily Miranda  DOB: 10-03-1940  MRN: [id number]  Age / Sex: 77 y.o., female  PCP: Angelina Sheriff, MD Referring Physician: Elodia Florence., *  Reason for Consultation: Establishing goals of care  HPI/Patient Profile: 77 y.o. female  with past medical history of diabetes mellitus type II, peripheral neuropathy, charcot foot right secondary to DM (plans for AKA at [facility]), CHF, CAD, NSTEMI s/p stents, HTN, aortic stenosis, pulmonary hypertension, CKD, HLD admitted on 10/04/2017 with dyspnea. Found to be hypoxic requiring BiPAP initially, now transitioned to nasal cannula. Echo reveals EF 45-50%, basal midinferoseptal hypokinesis, G1DD, severe RAE, mild LAE, and advancement of aortic stenosis. Recent NSTEMI s/p heart cath with stents on 5/30. Also with moderate pulmonary hypertension. Lasix currently on hold due to acute kidney injury. Palliative medicine consultation for goals of care.   Clinical Assessment and Goals of Care:  I have reviewed medical records, discussed with care team, and met with patient and husband Emily Miranda) at bedside to discuss diagnosis, Emily Miranda, EOL wishes, disposition and options. Patients son Emily Miranda) and granddaughter [name]) at bedside.   Patient is awake, alert, oriented and able to participate in [place] conversation. She feels better today and "almost" back to baseline prior to admit.   I introduced Palliative Medicine as specialized medical care for people living with serious illness. It focuses on providing relief from the symptoms and stress of a serious illness. The goal is to improve quality of life for both the patient and the family.  We discussed a brief life review of the patient. Her and Emily Miranda have been married for 65 years. They have three children. 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 Emily Miranda  and [place] share numerous stories of their past including the challenges of caring for son Emily Miranda) with special needs and frequent seizures. They have fought hard (and even assisted with passing a few legislative bills) because of their determination to stand up for individuals with special needs.   Emily Miranda has been followed by outpatient cardiology for 20+ years. Charcots foot has effected her functionally in the last year. Prior to hospitalization, patient using rolling walker with seat to get around. Emily Miranda cares for all of her needs along with other supportive family members.   Discussed hospital diagnoses, interventions, and multiple underlying co-morbidities. Explained chronic, progressive nature of CHF along with underlying co-morbidities.   I attempted to elicit values and goals of care important to the patient. She wishes to stay at home as long as possible (with support from family). Patient and husband speak of frustrations with previous stays at rehab and would prefer NOT to go to rehab after this hospitalization.   Patient's ultimate goal is to "walk again" preferably without walker. She is hopeful to get well enough to have amputation performed. We explained that amputation is put off for at least 6 months to one year after recent heart attack and her requiring blood thinners. Patient understands this.  Advanced directives, concepts specific to code status, and artifical feeding and hydration were discussed. Patient speaks clearly of her wishes against heroic measures including resuscitation, life support, and/or dialysis. "I don't want dialysis" and "I don't want to be hooked up to machines." Emily Miranda tells me her family understands her wishes.   Palliative Care services outpatient were explained and offered.  Questions and concerns were addressed.  Hard Choices booklet left for review. PMT contact information given.   Therapeutic listening as Emily Miranda and [place] share many stories.  Emotional/spiritual support provided.    SUMMARY OF RECOMMENDATIONS    Patient speaks clearly of her wishes against heroic interventions including resuscitation, life support, feeding tubes, and/or dialysis.   Continue medical management and current interventions.   She is feeling better today and is hopeful for discharge home soon. Patient/husband prefer not to go to SNF after hospitalization and share bad experiences from previous SNF visits.   Patient remains hopeful for improvement and eventually amputation. She wishes to "walk again" as she did prior to charcots foot.   Educated on chronic, progressive nature of CHF and with underlying co-morbidities.   Patient/family would benefit from outpatient palliative referral for future [place] discussions and supportive care.   Code Status/Advance Care Planning:  DNR  Symptom Management:   Per attending  Palliative Prophylaxis:   Aspiration and Delirium Protocol  Additional Recommendations (Limitations, Scope, Preferences):  DNR/DNI. No dialysis. Continue current interventions/supportive care.   Psycho-social/Spiritual:   Desire for further Chaplaincy support: yes  Additional Recommendations: Caregiving  Support/Resources  Prognosis:   Unable to determine  Discharge Planning: Home with Palliative Services      Primary Diagnoses: Present on Admission: . PVD (peripheral vascular disease) ([place]) . DM type 2 causing CKD stage 3 ([place]) . Charcot foot due to diabetes mellitus ([place]) . Acute on chronic combined systolic and diastolic HF (heart failure) ([place]) . Acute respiratory failure with hypoxia ([place]) . Hypertensive cardiomyopathy, with heart failure ([place]) . Acute combined systolic and diastolic congestive heart failure ([place]) . Unstable angina ([facility])   I have reviewed the medical record, interviewed the patient and family, and examined the patient. The following aspects are pertinent.  Past Medical History:  Diagnosis  Date  . AKI (acute kidney injury) ([place])   . Aortic stenosis, mild   . CAD (coronary artery disease)    a. 09/2016 NSTEMI/Cath: RCA 70p/m, RPDA 95ost-->Med Rx;  b. 10/2016 PCI: RCA 70p/m (2.25 x 15 Resolute Onyx DES), RPDA 95ost (2.0 x 12 Resolute Onyx DES).  . Charcot foot due to diabetes mellitus ([place])   . Chronic combined systolic (congestive) and diastolic (congestive) heart failure ([place])    a. 07/2016 Echo: EF 40-45%, Gr2 DD, mild AS, triv AI, mild MR, mod dil LA.  . Diabetic peripheral neuropathy ([place])   . Heart murmur   . History of blood transfusion    "related to low HgB"  . History of kidney stones   . History of stomach ulcers   . HTN (hypertension)   . Hyperlipidemia   . Migraine    "maybe once/month" (08/28/2017)  . Myocardial infarction ([place]) 07/2017   while at [facility], s/p cath with DES LAD and CFX  . NSTEMI (non-ST elevated myocardial infarction) ([place]) 10/06/2016  . Pancreatitis    "my dad died w/it at 67 yr old"  . Physical deconditioning    a. W/C bound.  . Pneumonia 10/2016  . Stasis dermatitis of both legs 05/2017  . Type II diabetes mellitus ([place])  Social History   Socioeconomic History  . Marital status: Married    Spouse name: Not on file  . Number of children: Not on file  . Years of education: Not on file  . Highest education level: Not on file  Occupational History  . Not on file  Social Needs  . Financial resource strain: Not on file  . Food insecurity:    Worry: Not on file    Inability: Not on file  . Transportation needs:    Medical: Not on file    Non-medical: Not on file  Tobacco Use  . Smoking status: Never Smoker  . Smokeless tobacco: Never Used  Substance and Sexual Activity  . Alcohol use: No  . Drug use: No  . Sexual activity: Not on file  Lifestyle  . Physical activity:    Days per week: Not on file    Minutes per session: Not on file  . Stress: Not on file  Relationships  . Social connections:    Talks on phone: Not on file     Gets together: Not on file    Attends religious service: Not on file    Active member of club or organization: Not on file    Attends meetings of clubs or organizations: Not on file    Relationship status: Not on file  Other Topics Concern  . Not on file  Social History Narrative  . Not on file   Family History  Problem Relation Age of Onset  . Chronic Renal Failure Mother   . Diabetes Father   . Heart attack Father 60   Scheduled Meds: . aspirin EC  81 mg Oral Daily  . atorvastatin  40 mg Oral q1800  . carvedilol  25 mg Oral BID WC  . cephALEXin  250 mg Oral Q12H  . clopidogrel  75 mg Oral Q breakfast  . cyclobenzaprine  10 mg Oral TID  . enoxaparin (LOVENOX) injection  30 mg Subcutaneous Daily  . hydrALAZINE  50 mg Oral Q8H  . insulin aspart  0-9 Units Subcutaneous TID WC  . insulin glargine  15 Units Subcutaneous QHS  . isosorbide mononitrate  30 mg Oral Daily  . linagliptin  5 mg Oral Daily  . loratadine  10 mg Oral Daily  . pantoprazole  40 mg Oral Daily  . sodium chloride flush  3 mL Intravenous Q12H   Continuous Infusions: . sodium chloride     PRN Meds:.sodium chloride, acetaminophen **OR** acetaminophen, dextromethorphan, fluticasone, morphine injection, nitroGLYCERIN, ondansetron **OR** ondansetron (ZOFRAN) IV, polyethylene glycol, sodium chloride flush, traMADol Medications Prior to Admission:  Prior to Admission medications   Medication Sig Start Date End Date Taking? Authorizing Provider  Ascorbic Acid (VITAMIN C) 1000 MG tablet Take 1,000 mg by mouth daily.   Yes [provider]  aspirin EC 81 MG tablet Take 1 tablet (81 mg total) by mouth daily. 09/04/17 09/04/18 Yes Daune Perch, NP  atorvastatin (LIPITOR) 40 MG tablet Take 1 tablet (40 mg total) by mouth daily at 6 PM. 08/05/16  Yes Sela Hilding, MD  carvedilol (COREG) 3.125 MG tablet Take 3 tablets (9.375 mg total) by mouth 2 (two) times daily with a meal. 09/04/17  Yes Daune Perch,  NP  clopidogrel (PLAVIX) 75 MG tablet Take 1 tablet (75 mg total) by mouth daily with breakfast. 11/10/16  Yes Theora Gianotti, NP  cyclobenzaprine (FLEXERIL) 10 MG tablet Take 10 mg by mouth 3 (three) times daily.  10/16/16  Yes [provider]  fexofenadine (ALLEGRA) 180 MG tablet Take 180 mg by mouth daily.   Yes [provider]  fluticasone (FLONASE) 50 MCG/ACT nasal spray Place 1 spray into both nostrils daily as needed (seasonal allergies).  02/05/16  Yes [provider]  furosemide (LASIX) 40 MG tablet Take 1 tablet (40 mg total) by mouth daily. 09/05/17  Yes Daune Perch, NP  glimepiride (AMARYL) 4 MG tablet Take 2-4 mg by mouth See admin instructions. Take 1 tablet by mouth every morning, and 1/2 tablet every evening 09/15/17  Yes [provider]  HUMALOG KWIKPEN 100 UNIT/ML KiwkPen Inject 2 Units into the skin daily with supper. 08/26/17  Yes [provider]  hydrALAZINE (APRESOLINE) 25 MG tablet Take 25 mg by mouth every 8 (eight) hours.   Yes [provider]  insulin glargine (LANTUS) 100 UNIT/ML injection Inject 0.12 mLs (12 Units total) into the skin at bedtime. Patient taking differently: Inject 40 Units into the skin at bedtime.  09/04/17  Yes Daune Perch, NP  isosorbide mononitrate (IMDUR) 30 MG 24 hr tablet Take 1 tablet (30 mg total) by mouth daily. 11/09/16  Yes Theora Gianotti, NP  linagliptin (TRADJENTA) 5 MG TABS tablet Take 5 mg by mouth daily.   Yes [provider]  lisinopril (PRINIVIL,ZESTRIL) 30 MG tablet Take 30 mg by mouth daily.   Yes [provider]  nitroGLYCERIN (NITROSTAT) 0.4 MG SL tablet Place 1 tablet (0.4 mg total) under the tongue every 5 (five) minutes x 3 doses as needed for chest pain. 11/09/16  Yes Theora Gianotti, NP  pantoprazole (PROTONIX) 40 MG tablet Take 1 tablet (40 mg total) by mouth daily. 07/04/17  Yes Minus Breeding, MD  Polyethyl Glycol-Propyl Glycol  (SYSTANE OP) Place 1 drop into both eyes daily as needed (dry eyes).   Yes [provider]  polyethylene glycol (MIRALAX / GLYCOLAX) packet Take 17 g by mouth daily as needed (constipation). Mix in 8 oz liquid and drink   Yes [provider]  collagenase (SANTYL) ointment Apply topically daily. Apply to LE plantar surface daily. Patient not taking: Reported on 10/04/2017 06/04/17   Arrien, Jimmy Picket, MD  diclofenac sodium (VOLTAREN) 1 % GEL Apply 2 g topically 4 (four) times daily. Please apply to let hip. Patient not taking: Reported on 10/04/2017 06/03/17   Arrien, Jimmy Picket, MD  HYDROcodone-acetaminophen (NORCO/VICODIN) 5-325 MG tablet Take 1 tablet by mouth every 6 (six) hours as needed for moderate pain. Patient not taking: Reported on 08/29/2017 06/03/17   Arrien, Jimmy Picket, MD  mupirocin cream (BACTROBAN) 2 % Apply 1 application topically 2 (two) times daily. Patient not taking: Reported on 10/04/2017 05/23/17   Landis Martins, DPM  silver sulfADIAZINE (SILVADENE) 1 % cream Apply topically daily. Apply to right lower extremities affected areas, not to the plantar region Patient not taking: Reported on 10/04/2017 06/04/17   Arrien, Jimmy Picket, MD   Allergies  Allergen Reactions  . Levofloxacin Nausea And Vomiting and Other (See Comments)    Confusion  . Amlodipine Other (See Comments)    Localized edema  . Gabapentin Other (See Comments)    Disoriented, no strength in legs  . Hydrochlorothiazide Other (See Comments)    Hot and disoriented  . Ibuprofen Other (See Comments)    Affected kidneys - stopped flow of urine Reaction to Advil  . Metformin And Related Other (See Comments)    Affected kidneys - stopped flow of urine  . Simvastatin Other (See Comments)  Chest pain   Review of Systems  Constitutional: Positive for activity change.  Respiratory: Positive for shortness of breath.   Neurological: Positive for weakness.   Physical Exam    Constitutional: She is oriented to person, place, and time. She is cooperative.  HENT:  Head: Normocephalic and atraumatic.  Pulmonary/Chest: No accessory muscle usage. No tachypnea. No respiratory distress.  Neurological: She is alert and oriented to person, place, and time.  Skin: Skin is warm and dry. There is pallor.  Psychiatric: She has a normal mood and affect. Her speech is normal and behavior is normal. Cognition and memory are normal.  Nursing note and vitals reviewed.  Vital Signs: BP (!) 161/78   Pulse 70   Temp 98.7 F (37.1 C) (Oral)   Resp 14   Ht _0  (1.676 m)   Wt 71 kg (156 lb 8 oz)   SpO2 100%   BMI 25.26 kg/m  Pain Scale: 0-10 POSS *See Group Information*: 1-Acceptable,Awake and alert Pain Score: 0-No pain   SpO2: SpO2: 100 % O2 Device:SpO2: 100 % O2 Flow Rate: .O2 Flow Rate (L/min): 2 L/min  IO: Intake/output summary:   Intake/Output Summary (Last 24 hours) at 10/16/2017 1824 Last data filed at 10/16/2017 0941 Gross per 24 hour  Intake 480 ml  Output 1700 ml  Net -1220 ml    LBM: Last BM Date: 10/14/17(per pt) Baseline Weight: Weight: 74.5 kg (164 lb 4.8 oz)(bed scale) Most recent weight: Weight: 71 kg (156 lb 8 oz)     Palliative Assessment/Data: PPS 50%   Flowsheet Rows     Most Recent Value  Intake Tab  Referral Department  Hospitalist  Unit at Time of Referral  Med/Surg Unit  Palliative Care Primary Diagnosis  Cardiac  Palliative Care Type  New Palliative care  Reason for referral  Clarify Goals of Care  Date first seen by Palliative Care  10/16/17  Clinical Assessment  Palliative Performance Scale Score  50%  Psychosocial & Spiritual Assessment  Palliative Care Outcomes  Patient/Family meeting held?  Yes  Who was at the meeting?  patient, husband  Palliative Care Outcomes  Clarified goals of care, Provided psychosocial or spiritual support, ACP counseling assistance, Linked to palliative care logitudinal support     Time In:  1500 Time Out: 1630 Time Total: 55mn Greater than 50%  of this time was spent counseling and coordinating care related to the above assessment and plan.  Signed by:  MIhor Dow FNP-C Palliative Medicine Team  Phone: [phone]: [phone]  Please contact Palliative Medicine Team phone at [phone] questions and concerns.  For individual provider: See AShea Evans

## 2017-10-16 NOTE — Progress Notes (Signed)
PROGRESS NOTE    Emily Miranda  VVZ:[id number] DOB: [birth year] DOA: 10/04/2017 PCP: Noni Saupe, MD   Brief Narrative:  Emily Miranda a76 y.o.femalewith Emily Miranda history of chronic combined CHF, AS, CAD s/p PCI, T2DM, stage III CKD, anemia, HTN and right foot charcot's deformity (with plans for AKA at [facility]) who presented with 1 day of abrupt onset dyspnea. She was found to be hypoxic requiring BiPAP, transitioned to nasal cannula. Diuresis was started with improvement and CXR f/u showed improved edema, although she still requires oxygen despite weight being down below previous DC weight. Echocardiogram demonstrated stable EF 45-50%, basal midinferoseptal hypokinesis, G1DD, severe RAE, mild LAE, and dilated/blunted IVF as well as advancement of aortic stenosis. She underwent heart cath 5/30 showed patent coronary stents, moderate pulm HTN. Hospitalization further complicated by AKI.   Assessment & Plan:   Principal Problem:   Acute on chronic combined systolic and diastolic HF (heart failure) (HCC) Active Problems:   DM type 2 causing CKD stage 3 (HCC)   Mild aortic stenosis   Hypertensive cardiomyopathy, with heart failure (HCC)   Acute combined systolic and diastolic congestive heart failure (HCC)   PVD (peripheral vascular disease) (HCC)   CAD S/P percutaneous coronary angioplasty   Charcot foot due to diabetes mellitus (HCC)   Unstable angina (HCC)   Acute kidney injury (HCC)   CHF (congestive heart failure) (HCC)   Acute respiratory failure with hypoxia (HCC)   Pressure injury of skin   DCM (dilated cardiomyopathy) (HCC)   Nonrheumatic aortic valve stenosis   Acute hypoxic respiratory failure: Due to CHF exacerbation -Continue [place] O2 as needed -will likely need home O2 - PFT's ordered -> possible restrictive disease, minimal obstructive airway disease.  Limited as pt unable to perform certain components of testing.   Acute on chronic combined systolic and  diastolic CHF  -S/p heart cath 5/44 showed patent coronary stents, moderate pulm HTN  - echo 5/28 with EF 45-50%, grade 1 diastolic dysfunction, hypokinesis of basal-midinferoseptal myocardium -Daily weights, I/O. Diuresed 164 > 150 lbs then started IVF's for creat bump >2.0, now improving  -Diuretics, ACE on hold due to renal function -Continue coreg, hydralazine, imdur -Cardiology following may benefit from pulmonary vasodilator for pulm HTN (recommending PFT's, no other changes in therapy at this time) - renal function improved today.   - Will plan to restart lasix tomorrow as long as creatinine stable (will plan to discuss dosing with cardiology).  She still has volume overload with CHF findings on imaging.   Wt Readings from Last 3 Encounters:  10/16/17 71 kg (156 lb 8 oz)  09/04/17 69.3 kg (152 lb 11.2 oz)  08/28/17 72.6 kg (160 lb)    AKI on stage IV CKD - progressive CKD over last few years w creat 1.5 in 2018 and creat 1.6- 1.9 in 2019 - Peaked to 2.57.  Improving today to 1.95.  Restart lasix tomorrow if continued improvement.  - suspect cardiorenal component for CKD/ AKI - maybe contrast injury as well - Previous provider spoke w/ pt and family, pt does not want any dialysis, she has had family and friends on dialysis and doesn't want to go through that; also would be Emily Miranda poor candidate given comorbidities and heart disease; family agrees  CAD s/p DES x2 June 2018 and DES March 2019  -Continue coreg , ASA, plavix, statin -Will need DAPT for at least 6 months, preferably 1 year   Aortic stenosis -S/p heart cath revealed mild-moderate  AS with mean aortic valve gradient and aortic valve area 1.48 cm2  Charcot deformity of right foot with pressure ulcer - WOC consulted, following local wound care recommendations.  - Follow up with [facility] orthopedics - patient had surgery/ BKA scheduled next week at [facility], however per cardiology patient is not likely Emily Miranda candidate for  surgery next week given her medical status as well as recent PCI in March of this year. Will need DAPT for at least 6 months, preferably 1 year prior to any elective surgery.    Insulin-dependent T2DM  -Lantus, SSI, tradjenta  Anemia of chronic disease -Received 1u PRBCs earlier this admission -Monitor CBC   Subclinical hypothyroidism -TSH 8.872, with normal free T4 1.02  -Suggest recheck labs in 4-6 weeks  Lower ext edema - probably R heart failure associated with pulm HTN - still significant LE edema but rising creatinine precludes giving more diuretics.  Pyuria  Dysuria: concern for UTI, sx of pressure, stinging, burning.  Suspect UTI.  Will start keflex. Reported she also has been I/O'd for urinary retention, will schedule bladder scans. - follow culture results  EOL - patient has signed DNR form at home, filled it out while in [facility] in [place] - confirmed with pt's family/ daughter/ husband - DNR order written  Deconditioning: PT recommending home health, but daughter concerned that pt's husband cannot provide 24/7 care that she needs at home.  Will ask PT to evaluate again with this in mind.  She tells me she'd potentially be willing to go to SNF.    Goals of Care: asked palliative to see her to assist with goals of care.  DVT prophylaxis: lovenox Code Status: DNR Family Communication: son and husband at bedside Disposition Plan: pending PFT's, discussed possibly tomorrow if renal function continuing to improve and cardiology input   Consultants:   cardiology  Procedures:  Right/Left Heart Cath  Moderate pulmonary hypertension -mean PA pressure 35 mmHg.  Diffuse three-vessel coronary disease with widely patent stents.  Left main is widely patent  Mid LAD contains Emily Miranda long region of stent which is widely patent.  Proximal and distal to the stent there is diffuse coronary disease with up to 70% narrowing.  This vessel is significantly improved since the pre-PCI  comparison from June 2018.  Widely patent stent in the proximal to mid circumflex.  Moderate diffuse disease is noted beyond the stent.  Widely patent stent in the mid RCA and in the proximal portion of the PDA.  Diffuse disease in the distal RCA improved compared to prior angiogram June 2018.  Normal left ventricular end-diastolic pressure.  Mild to moderate gradient across the aortic valve with calculated aortic valve area 1.48 cm  RECOMMENDATIONS:   Further management per treating team.  Echo 5/28 Study Conclusions  - Left ventricle: The cavity size was normal. There was mild   concentric hypertrophy. Systolic function was mildly reduced. The   estimated ejection fraction was in the range of 45% to 50%.   Hypokinesis of the basal-midinferoseptal myocardium. Doppler   parameters are consistent with abnormal left ventricular   relaxation (grade 1 diastolic dysfunction). Doppler parameters   are consistent with high ventricular filling pressure. - Aortic valve: There was moderate stenosis. There was moderate   regurgitation. Peak velocity (S): 365 cm/s. Mean gradient (S): 26   mm Hg. Valve area (VTI): 0.85 cm^2. Valve area (Vmax): 0.84 cm^2.   Valve area (Vmean): 0.81 cm^2. Regurgitation pressure half-time:   379 ms. - Mitral  valve: Transvalvular velocity was within the normal range.   There was no evidence for stenosis. There was mild regurgitation. - Left atrium: The atrium was mildly dilated. - Right ventricle: The cavity size was normal. Wall thickness was   normal. Systolic function was normal. - Right atrium: The atrium was severely dilated. - Tricuspid valve: There was mild regurgitation. - Pulmonary arteries: Systolic pressure was severely increased. PA   peak pressure: 60 mm Hg (S).   Antimicrobials:  Anti-infectives (From admission, onward)   Start     Dose/Rate Route Frequency Ordered Stop   10/15/17 2200  cephALEXin (KEFLEX) capsule 250 mg     250 mg Oral  Every 12 hours 10/15/17 1715       Subjective: Feeling ok. Would be open to going to SNF if this was recommended.   Objective: Vitals:   10/15/17 1220 10/15/17 1959 10/15/17 2228 10/16/17 0446  BP: (!) 145/66 (!) 147/61 (!) 168/71 (!) 160/66  Pulse: 72 71  72  Resp: 18 14    Temp: 97.6 F (36.4 C) 98.4 F (36.9 C)  98.7 F (37.1 C)  TempSrc: Oral Oral  Oral  SpO2: 99% 100%  100%  Weight:    71 kg (156 lb 8 oz)  Height:        Intake/Output Summary (Last 24 hours) at 10/16/2017 1651 Last data filed at 10/16/2017 0941 Gross per 24 hour  Intake 480 ml  Output 1700 ml  Net -1220 ml   Filed Weights   10/14/17 0330 10/15/17 0506 10/16/17 0446  Weight: 68.7 kg (151 lb 8 oz) 69.3 kg (152 lb 11.2 oz) 71 kg (156 lb 8 oz)    Examination:  General: No acute distress. Cardiovascular: Heart sounds show Emily Miranda regular rate, and rhythm. No gallops or rubs. No murmurs. No JVD. Lungs: Clear to auscultation bilaterally with good air movement. No rales, rhonchi or wheezes. Abdomen: Soft, nontender, nondistended with normal active bowel sounds. No masses. No hepatosplenomegaly. Neurological: Alert and oriented 3. Moves all extremities 4. Cranial nerves II through XII grossly intact. Skin: Warm and dry. No rashes or lesions. Extremities: RLE deformity, dressings to bilateral LE Psychiatric: Mood and affect are normal. Insight and judgment are appropriate.  Data Reviewed: I have personally reviewed following labs and imaging studies  CBC: Recent Labs  Lab 10/09/17 1836 10/11/17 0533 10/16/17 0432  WBC 8.7 7.2 4.4  HGB 8.3* 8.0* 8.0*  HCT 28.2* 26.5* 27.2*  MCV 85.2 85.2 84.7  PLT 241 245 344   Basic Metabolic Panel: Recent Labs  Lab 10/12/17 0646 10/13/17 0443 10/14/17 0536 10/15/17 0532 10/16/17 0432  NA 139 138 139 140 139  K 3.8 3.9 4.0 4.4 4.5  CL 107 109 107 113* 112*  CO2 24 23 24 23 23   GLUCOSE 78 217* 202* 116* 126*  BUN 52* 56* 57* 57* 50*  CREATININE 2.57* 2.57*  2.55* 2.30* 1.95*  CALCIUM 8.4* 8.0* 8.3* 8.3* 8.4*  MG  --   --   --   --  2.1   GFR: Estimated Creatinine Clearance: 23.3 mL/min (Emily Miranda) (by C-G formula based on SCr of 1.95 mg/dL (H)). Liver Function Tests: No results for input(s): AST, ALT, ALKPHOS, BILITOT, PROT, ALBUMIN in the last 168 hours. No results for input(s): LIPASE, AMYLASE in the last 168 hours. No results for input(s): AMMONIA in the last 168 hours. Coagulation Profile: No results for input(s): INR, PROTIME in the last 168 hours. Cardiac Enzymes: No results for input(s): CKTOTAL, CKMB,  CKMBINDEX, TROPONINI in the last 168 hours. BNP (last 3 results) No results for input(s): PROBNP in the last 8760 hours. HbA1C: No results for input(s): HGBA1C in the last 72 hours. CBG: Recent Labs  Lab 10/15/17 2108 10/15/17 2353 10/16/17 0722 10/16/17 1213 10/16/17 1606  GLUCAP 163* 160* 103* 134* 178*   Lipid Profile: No results for input(s): CHOL, HDL, LDLCALC, TRIG, CHOLHDL, LDLDIRECT in the last 72 hours. Thyroid Function Tests: No results for input(s): TSH, T4TOTAL, FREET4, T3FREE, THYROIDAB in the last 72 hours. Anemia Panel: No results for input(s): VITAMINB12, FOLATE, FERRITIN, TIBC, IRON, RETICCTPCT in the last 72 hours. Sepsis Labs: No results for input(s): PROCALCITON, LATICACIDVEN in the last 168 hours.  Recent Results (from the past 240 hour(s))  Culture, Urine     Status: Abnormal (Preliminary result)   Collection Time: 10/15/17  4:50 PM  Result Value Ref Range Status   Specimen Description URINE, RANDOM  Final   Special Requests   Final    NONE Performed at [facility], [address] [place], [place] [address]    Culture >=100,000 COLONIES/mL GRAM NEGATIVE RODS ([place]pi Edwin)  Final   Report Status PENDING  Incomplete         Radiology Studies: Dg Chest 2 View  Result Date: 10/16/2017 CLINICAL DATA:  Hypoxia; history coronary disease post MI and coronary stenting, CHF, hypertension, type II diabetes  mellitus EXAM: CHEST - 2 VIEW COMPARISON:  10/12/2017 FINDINGS: Enlargement of cardiac silhouette. Mediastinal contours normal. Mild pulmonary vascular congestion. Interstitial infiltrates favoring pulmonary edema slightly improved at RIGHT base and slightly increased in LEFT perihilar region since previous exam. Bibasilar pleural effusions and atelectasis. Atherosclerotic calcification aortic arch. No pneumothorax. Bones demineralized. IMPRESSION: Persistent CHF with bibasilar effusions and atelectasis. Electronically Signed   By: Ulyses Southward M.D.   On: 10/16/2017 09:45        Scheduled Meds: . aspirin EC  81 mg Oral Daily  . atorvastatin  40 mg Oral q1800  . carvedilol  25 mg Oral BID WC  . cephALEXin  250 mg Oral Q12H  . clopidogrel  75 mg Oral Q breakfast  . cyclobenzaprine  10 mg Oral TID  . enoxaparin (LOVENOX) injection  30 mg Subcutaneous Daily  . [START ON 10/17/2017] furosemide  40 mg Oral Daily  . hydrALAZINE  50 mg Oral Q8H  . insulin aspart  0-9 Units Subcutaneous TID WC  . insulin glargine  15 Units Subcutaneous QHS  . isosorbide mononitrate  30 mg Oral Daily  . linagliptin  5 mg Oral Daily  . loratadine  10 mg Oral Daily  . pantoprazole  40 mg Oral Daily  . sodium chloride flush  3 mL Intravenous Q12H   Continuous Infusions: . sodium chloride       LOS: 12 days    Time spent: over 30 min    Lacretia Nicks, MD [facility] Pager [phone]  If 7PM-7AM, please contact night-coverage [email or web address] Password [facility] 10/16/2017, 4:51 PM

## 2017-10-16 NOTE — Progress Notes (Addendum)
Progress Note  Patient Name: Emily Miranda Date of Encounter: 10/16/2017  Primary Cardiologist: Rollene Rotunda, MD   Subjective   She tells me that she is back to her baseline. She is SOB with minimal exertion and activity. She does not want to go to rehab and feels that she has enough help at home. She is anxious to leave.   Inpatient Medications    Scheduled Meds: . aspirin EC  81 mg Oral Daily  . atorvastatin  40 mg Oral q1800  . carvedilol  25 mg Oral BID WC  . cephALEXin  250 mg Oral Q12H  . clopidogrel  75 mg Oral Q breakfast  . cyclobenzaprine  10 mg Oral TID  . enoxaparin (LOVENOX) injection  30 mg Subcutaneous Daily  . hydrALAZINE  50 mg Oral Q8H  . insulin aspart  0-9 Units Subcutaneous TID WC  . insulin glargine  15 Units Subcutaneous QHS  . isosorbide mononitrate  30 mg Oral Daily  . linagliptin  5 mg Oral Daily  . loratadine  10 mg Oral Daily  . pantoprazole  40 mg Oral Daily  . sodium chloride flush  3 mL Intravenous Q12H   Continuous Infusions: . sodium chloride     PRN Meds: sodium chloride, acetaminophen **OR** acetaminophen, dextromethorphan, fluticasone, morphine injection, nitroGLYCERIN, ondansetron **OR** ondansetron (ZOFRAN) IV, polyethylene glycol, sodium chloride flush, traMADol   Vital Signs    Vitals:   10/15/17 1220 10/15/17 1959 10/15/17 2228 10/16/17 0446  BP: (!) 145/66 (!) 147/61 (!) 168/71 (!) 160/66  Pulse: 72 71  72  Resp: 18 14    Temp: 97.6 F (36.4 C) 98.4 F (36.9 C)  98.7 F (37.1 C)  TempSrc: Oral Oral  Oral  SpO2: 99% 100%  100%  Weight:    156 lb 8 oz (71 kg)  Height:        Intake/Output Summary (Last 24 hours) at 10/16/2017 1003 Last data filed at 10/16/2017 0941 Gross per 24 hour  Intake 720 ml  Output 2200 ml  Net -1480 ml   Filed Weights   10/14/17 0330 10/15/17 0506 10/16/17 0446  Weight: 151 lb 8 oz (68.7 kg) 152 lb 11.2 oz (69.3 kg) 156 lb 8 oz (71 kg)   Telemetry    NSR- Personally Reviewed  ECG      No new EKG for review - Personally Reviewed  Physical Exam   General: Well nourished female in no acute distress Pulm: Good air movement with no wheezing or crackles  CV: RRR, systolic murmur Abdomen: Active bowel sounds, soft, non-distended, no tenderness to palpation  Extremities: No LE edema  Skin: Warm and dry  Neuro: Alert and oriented x 3  Labs    Chemistry Recent Labs  Lab 10/14/17 0536 10/15/17 0532 10/16/17 0432  NA 139 140 139  K 4.0 4.4 4.5  CL 107 113* 112*  CO2 24 23 23   GLUCOSE 202* 116* 126*  BUN 57* 57* 50*  CREATININE 2.55* 2.30* 1.95*  CALCIUM 8.3* 8.3* 8.4*  GFRNONAA 17* 20* 24*  GFRAA 20* 23* 28*  ANIONGAP 8 4* 4*    Hematology Recent Labs  Lab 10/09/17 1836 10/11/17 0533 10/16/17 0432  WBC 8.7 7.2 4.4  RBC 3.31* 3.11* 3.21*  HGB 8.3* 8.0* 8.0*  HCT 28.2* 26.5* 27.2*  MCV 85.2 85.2 84.7  MCH 25.1* 25.7* 24.9*  MCHC 29.4* 30.2 29.4*  RDW 16.3* 16.6* 16.4*  PLT 241 245 344   Cardiac EnzymesNo results for  input(s): TROPONINI in the last 168 hours. No results for input(s): TROPIPOC in the last 168 hours.   BNP Recent Labs  Lab 10/09/17 1240  BNP 4,427.6*     DDimer No results for input(s): DDIMER in the last 168 hours.   Radiology    Dg Chest 2 View  Result Date: 10/16/2017 CLINICAL DATA:  Hypoxia; history coronary disease post MI and coronary stenting, CHF, hypertension, type II diabetes mellitus EXAM: CHEST - 2 VIEW COMPARISON:  10/12/2017 FINDINGS: Enlargement of cardiac silhouette. Mediastinal contours normal. Mild pulmonary vascular congestion. Interstitial infiltrates favoring pulmonary edema slightly improved at RIGHT base and slightly increased in LEFT perihilar region since previous exam. Bibasilar pleural effusions and atelectasis. Atherosclerotic calcification aortic arch. No pneumothorax. Bones demineralized. IMPRESSION: Persistent CHF with bibasilar effusions and atelectasis. Electronically Signed   By: Ulyses Southward M.D.    On: 10/16/2017 09:45   Cardiac Studies   Cardiac Cath 10/09/2017  Conclusion   Moderate pulmonary hypertension -mean PA pressure 35 mmHg.  Diffuse three-vessel coronary disease with widely patent stents.  Left main is widely patent  Mid LAD contains a long region of stent which is widely patent. Proximal and distal to the stent there is diffuse coronary disease with up to 70% narrowing. This vessel is significantly improved since the pre-PCI comparison from June 2018.  Widely patent stent in the proximal to mid circumflex. Moderate diffuse disease is noted beyond the stent.  Widely patent stent in the mid RCA and in the proximal portion of the PDA. Diffuse disease in the distal RCA improved compared to prior angiogram June 2018.  Normal left ventricular end-diastolic pressure.  Mild to moderate gradient across the aortic valve with calculated aortic valve area 1.48 cm  RECOMMENDATIONS:   Further management per treating team.   2D echo 10/07/2017 Study Conclusions - Left ventricle: The cavity size was normal. There was mild concentric hypertrophy. Systolic function was mildly reduced. The estimated ejection fraction was in the range of 45% to 50%. Hypokinesis of the basal-midinferoseptal myocardium. Doppler parameters are consistent with abnormal left ventricular relaxation (grade 1 diastolic dysfunction). Doppler parameters are consistent with high ventricular filling pressure. - Aortic valve: There was moderate stenosis. There was moderate regurgitation. Peak velocity (S): 365 cm/s. Mean gradient (S): 26 mm Hg. Valve area (VTI): 0.85 cm^2. Valve area (Vmax): 0.84 cm^2. Valve area (Vmean): 0.81 cm^2. Regurgitation pressure half-time: 379 ms. - Mitral valve: Transvalvular velocity was within the normal range. There was no evidence for stenosis. There was mild regurgitation. - Left atrium: The atrium was mildly dilated. - Right ventricle: The  cavity size was normal. Wall thickness was normal. Systolic function was normal. - Right atrium: The atrium was severely dilated. - Tricuspid valve: There was mild regurgitation. - Pulmonary arteries: Systolic pressure was severely increased. PA peak pressure: 60 mm Hg (S).  Patient Profile     77 y.o. female with PMH of chronic combined CHF (EF 40-45%), mild-mod AS, CAD s/p PCI to RCA 10/2016 and LAD/ Cx 07/2017, HTN, DM type 2, CKD stage 3, amnemia, R charcot's deformity with plans for ADA at [facility], who presented with SOB. Cardiology following for acute on chronic CHF and worsening AS on echocardiogram.  Assessment & Plan    HFpEF, Acute exacerbation  - NYHA class III at baseline  - Echocardiogram with LVEF of 45-50% and new septal hypokinesis, G1DD, RAE, LAE, and advancement in her AS.  - Right/left heart cath illustrated diffuse 3 vessel disease and widely patent  stents along with elevated PA pressures but normal LVED pressures - Net negative 7.7 L since admission - Renal function improving with stable Mg and potassium  - Holding diuretics  - Continue hydralazine, imdur, and carvedilol   AS - Mean gradient 26 mmHg - Continue routine monitoring   CAD s/p multiple PCI -  Right/left heart cath illustrated diffuse 3 vessel disease and widely patent stents - Continue ASA, plavix, atorvastatin, carvedilol, and imdur  - Holding lisinopril due to AKI  AKI on CKD Stage IV - Creatinine down to 1.95 from 2.57 on 10/12/2017 (baseline 1.7-1.8) - Continuing to hold lisinopril and diuretics   For questions or updates, please contact [facility] Please consult [email or web address] for contact info under Cardiology/STEMI.     Signed, Levora Dredge, MD  10/16/2017, 10:03 AM    Attending Note:   The patient was seen and examined.  Agree with assessment and plan as noted above.  Changes made to the above note as needed.  Patient seen and independently examined with Levora Dredge, MD .   We  discussed all aspects of the encounter. I agree with the assessment and plan as stated above.  1.  Acute on chronic combined systolic and diastolic congestive heart failure: She seems to be feeling a bit better. I/O are -12.3 liters this admission Continue current meds  2.  pulmonary HTN - mild   3.  CAD - no angina.   S/p stenting in March. Continue DAPT    I have spent a total of 40 minutes with patient reviewing hospital  notes , telemetry, EKGs, labs and examining patient as well as establishing an assessment and plan that was discussed with the patient. > 50% of time was spent in direct patient care.    Vesta Mixer, Montez Hageman., MD, [facility] 10/16/2017, 10:59 AM [address],  [address] Office [phone] Pager [phone]

## 2017-10-17 LAB — BASIC METABOLIC PANEL
Anion gap: 6 (ref 5–15)
BUN: 46 mg/dL — ABNORMAL HIGH (ref 6–20)
CO2: 22 mmol/L (ref 22–32)
Calcium: 8.5 mg/dL — ABNORMAL LOW (ref 8.9–10.3)
Chloride: 112 mmol/L — ABNORMAL HIGH (ref 101–111)
Creatinine, Ser: 1.77 mg/dL — ABNORMAL HIGH (ref 0.44–1.00)
GFR calc Af Amer: 31 mL/min — ABNORMAL LOW (ref >60)
GFR calc non Af Amer: 27 mL/min — ABNORMAL LOW (ref >60)
Glucose, Bld: 134 mg/dL — ABNORMAL HIGH (ref 65–99)
Potassium: 4.5 mmol/L (ref 3.5–5.1)
Sodium: 140 mmol/L (ref 135–145)

## 2017-10-17 LAB — URINE CULTURE: Culture: >=100000 — AB

## 2017-10-17 LAB — CBC
HCT: 29.6 % — ABNORMAL LOW (ref 36.0–46.0)
Hemoglobin: 8.6 g/dL — ABNORMAL LOW (ref 12.0–15.0)
MCH: 24.6 pg — ABNORMAL LOW (ref 26.0–34.0)
MCHC: 29.1 g/dL — ABNORMAL LOW (ref 30.0–36.0)
MCV: 84.8 fL (ref 78.0–100.0)
Platelets: 383 10*3/uL (ref 150–400)
RBC: 3.49 MIL/uL — ABNORMAL LOW (ref 3.87–5.11)
RDW: 16.5 % — ABNORMAL HIGH (ref 11.5–15.5)
WBC: 4.6 10*3/uL (ref 4.0–10.5)

## 2017-10-17 LAB — GLUCOSE, CAPILLARY
Glucose-Capillary: 136 mg/dL — ABNORMAL HIGH (ref 65–99)
Glucose-Capillary: 191 mg/dL — ABNORMAL HIGH (ref 65–99)
Glucose-Capillary: 110 mg/dL — ABNORMAL HIGH (ref 65–99)

## 2017-10-17 LAB — MAGNESIUM: Magnesium: 2 mg/dL (ref 1.7–2.4)

## 2017-10-17 MED ORDER — TAMSULOSIN HCL 0.4 MG PO CAPS
0.4000 mg | ORAL_CAPSULE | Freq: Every day | ORAL | Status: DC
  Administered 2017-10-17: 0.4 mg via ORAL
  Filled 2017-10-17: qty 1

## 2017-10-17 MED ORDER — FUROSEMIDE 40 MG PO TABS: 40.0000 mg | ORAL_TABLET | Freq: Every day | ORAL | Status: DC

## 2017-10-17 NOTE — Progress Notes (Signed)
MD Lowell Guitar aware of pt retaining urine  MD stated to place foley if next bladder scan pt is retaining  MD aware of description of urine and needing oxygen at discharge

## 2017-10-17 NOTE — Care Management Note (Addendum)
Case Management Note  Patient Details  Name: Emily Miranda MRN: [id number] Date of Birth: [birth year]  Subjective/Objective:   CHF                Action/Plan: CM talked to patient at the bedside with family members at the bedside. Physical Therapy to re-eval patient today for home vs SNF placement. Patient stated that she wants to go home at discharge with [facility] services provided by [facility]; she is agreeable to outpatient Palliative Care; pt/ family is requesting a hospital bed and home oxygen; pt stated that hospital bed could be delivered to the home at any time; DME - walker and cane at home; CM will continue to follow for progression of care.  2:47 pm- CM talked to patient again at the bedside with spouse present; pt does not know if she wants the hospital bed at this time; she is agreeable to home 02[name] with [facility] made aware, 3:1; [name] with [facility] made aware of discharge home tomorrow; Referral made to [facility] with [facility] for Outpatient Palliative Care services as requested. B Oliviarose Punch RN,MHA,BSN  3:20 pm- follow up apt made with PCP Dr Gwendlyn Deutscher for October 23, 2017 at 11:30 am; please fax dc summary to [phone]. Abelino Derrick RN,MHA,BSN  Expected Discharge Date:    possibly 10/18/2017              Expected Discharge Plan:  Home w Home Health Services  In-House Referral:  [facility]  Discharge planning Services  CM Consult  Post Acute Care Choice:  Home Health Choice offered to:  Patient, Spouse  DME Arranged:   Hospital bed; 3:1 DME Agency:   [facility]  HH Arranged:  RN, PT, OT, Nurse's Aide HH Agency:  [facility]  Status of Service:  In process, will continue to follow  If discussed at Long Length of Stay Meetings, dates discussed:  10/16/2017  Additional Comments: Continue plan of care  [name] [phone] 10/17/2017, 1:02 PM

## 2017-10-17 NOTE — Progress Notes (Signed)
PROGRESS NOTE    Emily Miranda  BMW:[id number] DOB: 29-Jun-1940 DOA: 10/04/2017 PCP: Emily Saupe, MD   Brief Narrative:  Emily Miranda a76 y.o.femalewith Emily Miranda history of chronic combined CHF, AS, CAD s/p PCI, T2DM, stage III CKD, anemia, HTN and right foot charcot's deformity (with plans for AKA at [facility]) who presented with 1 day of abrupt onset dyspnea. She was found to be hypoxic requiring BiPAP, transitioned to nasal cannula. Diuresis was started with improvement and CXR f/u showed improved edema, although she still requires oxygen despite weight being down below previous DC weight. Echocardiogram demonstrated stable EF 45-50%, basal midinferoseptal hypokinesis, G1DD, severe RAE, mild LAE, and dilated/blunted IVF as well as advancement of aortic stenosis. She underwent heart cath 5/30 showed patent coronary stents, moderate pulm HTN. Hospitalization further complicated by AKI.   Assessment & Plan:   Principal Problem:   Acute on chronic combined systolic and diastolic HF (heart failure) (HCC) Active Problems:   DM type 2 causing CKD stage 3 (HCC)   Mild aortic stenosis   Hypertensive cardiomyopathy, with heart failure (HCC)   Acute combined systolic and diastolic congestive heart failure (HCC)   PVD (peripheral vascular disease) (HCC)   CAD S/P percutaneous coronary angioplasty   Charcot foot due to diabetes mellitus (HCC)   Unstable angina (HCC)   Acute kidney injury (HCC)   CHF (congestive heart failure) (HCC)   Acute respiratory failure with hypoxia (HCC)   Pressure injury of skin   DCM (dilated cardiomyopathy) (HCC)   Nonrheumatic aortic valve stenosis   Palliative care by specialist   Goals of care, counseling/discussion   Acute pulmonary edema (HCC)   Acute hypoxic respiratory failure: Due to CHF exacerbation -Continue [place] O2 as needed - will likely need home O2 - PFT's ordered -> possible restrictive disease, minimal obstructive airway disease.  Limited  as pt unable to perform certain components of testing - Outpatient pulmonology follow up arranged    Acute on chronic combined systolic and diastolic CHF  Pulmonary Hypertension -S/p heart cath 5/30 showed patent coronary stents, moderate pulm HTN  - echo 5/28 with EF 45-50%, grade 1 diastolic dysfunction, hypokinesis of basal-midinferoseptal myocardium -Daily weights, I/O. Diuresed 164 > 150 lbs then started IVF's for creat bump >2.0, now improving  -Diuretics, ACE on hold due to renal function -Continue coreg, hydralazine, imdur -Cardiology following may benefit from pulmonary vasodilator for pulm HTN (recommending PFT's, no other changes in therapy at this time) - renal function improved today.   - Holding lasix for now, has had 1.5 L out already today with foley placement.  Will need to decide tomorrow long term plan for lasix, but will watch UOP for now.  - We've been limited in ability to diurese with kidney function.  Will need continued close follow up after discharge.  Wt Readings from Last 3 Encounters:  10/17/17 70.8 kg (156 lb)  09/04/17 69.3 kg (152 lb 11.2 oz)  08/28/17 72.6 kg (160 lb)    AKI on stage IV CKD - progressive CKD over last few years w creat 1.5 in 2018 and creat 1.6- 1.9 in 2019 - Peaked to 2.57.  Improving today to 1.77 - Holding diuresis at the moment with 1.5 L out with foley placement, will continue to monitor - suspect cardiorenal component for CKD/ AKI - maybe contrast injury as well - pt also with urinary retention, but no evidence of hydro on [place] from 5/30 - Previous provider spoke w/ pt and family,  pt does not want any dialysis, she has had family and friends on dialysis and doesn't want to go through that; also would be Emily Miranda poor candidate given comorbidities and heart disease; family agrees.  She reiterated this to me as well.  Acute Urinary Retention: - foley catheter placed - being treated for UTI  - Start flomax - follow up with urology  arranged   CAD s/p DES x2 June 2018 and DES March 2019  -Continue coreg , ASA, plavix, statin -Will need DAPT for at least 6 months, preferably 1 year   Aortic stenosis -S/p heart cath revealed mild-moderate AS with mean aortic valve gradient and aortic valve area 1.48 cm2  Charcot deformity of right foot with pressure ulcer - WOC consulted, following local wound care recommendations.  - Follow up with [facility] orthopedics - patient had surgery/ BKA scheduled next week at [facility], however per cardiology patient is not likely Emily Miranda candidate for surgery next week given her medical status as well as recent PCI in March of this year. Will need DAPT for at least 6 months, preferably 1 year prior to any elective surgery.    Insulin-dependent T2DM  -Lantus, SSI, tradjenta  Anemia of chronic disease -Received 1u PRBCs earlier this admission -Monitor CBC   Subclinical hypothyroidism -TSH 8.872, with normal free T4 1.02  -Suggest recheck labs in 4-6 weeks  Lower ext edema - probably R heart failure associated with pulm HTN  Klebsiella UTI: concern for UTI, sx of pressure, stinging, burning.  Suspect UTI.  Will start keflex.  EOL - patient has signed DNR form at home, filled it out while in [facility] in [place] - confirmed with pt's family/ daughter/ husband - DNR order written  Deconditioning: PT recommending home health, but daughter concerned that pt's husband cannot provide 24/7 care that she needs at home.  Will ask PT to evaluate again with this in mind.  She tells me she'd potentially be willing to go to SNF.  - Sounds like palliative discussion, pt prefers to go home, will work on home health   Goals of Care: asked palliative to see her to assist with goals of care.  Appreciate recs.  They confirm DNR and note she prefers to go home.   DVT prophylaxis: lovenox Code Status: DNR Family Communication: discussed with daughter over phone Disposition Plan: likely  tomorrow  Consultants:   cardiology  Procedures:  Right/Left Heart Cath  Moderate pulmonary hypertension -mean PA pressure 35 mmHg.  Diffuse three-vessel coronary disease with widely patent stents.  Left main is widely patent  Mid LAD contains Jensen Kilburg long region of stent which is widely patent.  Proximal and distal to the stent there is diffuse coronary disease with up to 70% narrowing.  This vessel is significantly improved since the pre-PCI comparison from June 2018.  Widely patent stent in the proximal to mid circumflex.  Moderate diffuse disease is noted beyond the stent.  Widely patent stent in the mid RCA and in the proximal portion of the PDA.  Diffuse disease in the distal RCA improved compared to prior angiogram June 2018.  Normal left ventricular end-diastolic pressure.  Mild to moderate gradient across the aortic valve with calculated aortic valve area 1.48 cm  RECOMMENDATIONS:   Further management per treating team.  Echo 5/28 Study Conclusions  - Left ventricle: The cavity size was normal. There was mild   concentric hypertrophy. Systolic function was mildly reduced. The   estimated ejection fraction was in the range  of 45% to 50%.   Hypokinesis of the basal-midinferoseptal myocardium. Doppler   parameters are consistent with abnormal left ventricular   relaxation (grade 1 diastolic dysfunction). Doppler parameters   are consistent with high ventricular filling pressure. - Aortic valve: There was moderate stenosis. There was moderate   regurgitation. Peak velocity (S): 365 cm/s. Mean gradient (S): 26   mm Hg. Valve area (VTI): 0.85 cm^2. Valve area (Vmax): 0.84 cm^2.   Valve area (Vmean): 0.81 cm^2. Regurgitation pressure half-time:   379 ms. - Mitral valve: Transvalvular velocity was within the normal range.   There was no evidence for stenosis. There was mild regurgitation. - Left atrium: The atrium was mildly dilated. - Right ventricle: The cavity size  was normal. Wall thickness was   normal. Systolic function was normal. - Right atrium: The atrium was severely dilated. - Tricuspid valve: There was mild regurgitation. - Pulmonary arteries: Systolic pressure was severely increased. PA   peak pressure: 60 mm Hg (S).   Antimicrobials:  Anti-infectives (From admission, onward)   Start     Dose/Rate Route Frequency Ordered Stop   10/15/17 2200  cephALEXin (KEFLEX) capsule 250 mg     250 mg Oral Every 12 hours 10/15/17 1715       Subjective: Feels ok today.    Objective: Vitals:   10/17/17 1310 10/17/17 1313 10/17/17 1314 10/17/17 1318  BP:    (!) 149/65  Pulse:    72  Resp:    18  Temp:      TempSrc:      SpO2: 92% (!) 88% 94% 92%  Weight:      Height:        Intake/Output Summary (Last 24 hours) at 10/17/2017 1638 Last data filed at 10/17/2017 1125 Gross per 24 hour  Intake 420 ml  Output 1500 ml  Net -1080 ml   Filed Weights   10/15/17 0506 10/16/17 0446 10/17/17 0402  Weight: 69.3 kg (152 lb 11.2 oz) 71 kg (156 lb 8 oz) 70.8 kg (156 lb)    Examination:  General: No acute distress. Cardiovascular: Heart sounds show Baljit Liebert regular rate, and rhythm. No gallops or rubs. No murmurs. No JVD. Lungs: On 2 L, no appreciable adventitious lung sounds today Abdomen: Soft, nontender, nondistended with normal active bowel sounds. No masses. No hepatosplenomegaly. Neurological: Alert and oriented 3. Moves all extremities 4. Cranial nerves II through XII grossly intact. Skin: Warm and dry. No rashes or lesions. Extremities: RLE deformity.  LE dressed bilaterally Psychiatric: Mood and affect are normal. Insight and judgment are appropriate.  Data Reviewed: I have personally reviewed following labs and imaging studies  CBC: Recent Labs  Lab 10/11/17 0533 10/16/17 0432 10/17/17 0557  WBC 7.2 4.4 4.6  HGB 8.0* 8.0* 8.6*  HCT 26.5* 27.2* 29.6*  MCV 85.2 84.7 84.8  PLT 245 344 383   Basic Metabolic Panel: Recent Labs  Lab  10/13/17 0443 10/14/17 0536 10/15/17 0532 10/16/17 0432 10/17/17 0557  NA 138 139 140 139 140  K 3.9 4.0 4.4 4.5 4.5  CL 109 107 113* 112* 112*  CO2 23 24 23 23 22   GLUCOSE 217* 202* 116* 126* 134*  BUN 56* 57* 57* 50* 46*  CREATININE 2.57* 2.55* 2.30* 1.95* 1.77*  CALCIUM 8.0* 8.3* 8.3* 8.4* 8.5*  MG  --   --   --  2.1 2.0   GFR: Estimated Creatinine Clearance: 25.7 mL/min (Airon Sahni) (by C-G formula based on SCr of 1.77 mg/dL (H)). Liver Function  Tests: No results for input(s): AST, ALT, ALKPHOS, BILITOT, PROT, ALBUMIN in the last 168 hours. No results for input(s): LIPASE, AMYLASE in the last 168 hours. No results for input(s): AMMONIA in the last 168 hours. Coagulation Profile: No results for input(s): INR, PROTIME in the last 168 hours. Cardiac Enzymes: No results for input(s): CKTOTAL, CKMB, CKMBINDEX, TROPONINI in the last 168 hours. BNP (last 3 results) No results for input(s): PROBNP in the last 8760 hours. HbA1C: No results for input(s): HGBA1C in the last 72 hours. CBG: Recent Labs  Lab 10/16/17 1213 10/16/17 1606 10/16/17 2102 10/17/17 0709 10/17/17 1219  GLUCAP 134* 178* 170* 136* 191*   Lipid Profile: No results for input(s): CHOL, HDL, LDLCALC, TRIG, CHOLHDL, LDLDIRECT in the last 72 hours. Thyroid Function Tests: No results for input(s): TSH, T4TOTAL, FREET4, T3FREE, THYROIDAB in the last 72 hours. Anemia Panel: No results for input(s): VITAMINB12, FOLATE, FERRITIN, TIBC, IRON, RETICCTPCT in the last 72 hours. Sepsis Labs: No results for input(s): PROCALCITON, LATICACIDVEN in the last 168 hours.  Recent Results (from the past 240 hour(s))  Culture, Urine     Status: Abnormal   Collection Time: 10/15/17  4:50 PM  Result Value Ref Range Status   Specimen Description URINE, RANDOM  Final   Special Requests   Final    NONE Performed at [facility], [address], [place], [place] [address]    Culture >=100,000 COLONIES/mL KLEBSIELLA PNEUMONIAE (Deneen Slager)   Final   Report Status 10/17/2017 FINAL  Final   Organism ID, Bacteria KLEBSIELLA PNEUMONIAE (Janelly Switalski)  Final      Susceptibility   Klebsiella pneumoniae - MIC*    AMPICILLIN >=32 RESISTANT Resistant     CEFAZOLIN <=4 SENSITIVE Sensitive     CEFTRIAXONE <=1 SENSITIVE Sensitive     CIPROFLOXACIN <=0.25 SENSITIVE Sensitive     GENTAMICIN <=1 SENSITIVE Sensitive     IMIPENEM <=0.25 SENSITIVE Sensitive     NITROFURANTOIN 64 INTERMEDIATE Intermediate     TRIMETH/SULFA <=20 SENSITIVE Sensitive     AMPICILLIN/SULBACTAM >=32 RESISTANT Resistant     PIP/TAZO 8 SENSITIVE Sensitive     Extended ESBL NEGATIVE Sensitive     * >=100,000 COLONIES/mL KLEBSIELLA PNEUMONIAE         Radiology Studies: Dg Chest 2 View  Result Date: 10/16/2017 CLINICAL DATA:  Hypoxia; history coronary disease post MI and coronary stenting, CHF, hypertension, type II diabetes mellitus EXAM: CHEST - 2 VIEW COMPARISON:  10/12/2017 FINDINGS: Enlargement of cardiac silhouette. Mediastinal contours normal. Mild pulmonary vascular congestion. Interstitial infiltrates favoring pulmonary edema slightly improved at RIGHT base and slightly increased in LEFT perihilar region since previous exam. Bibasilar pleural effusions and atelectasis. Atherosclerotic calcification aortic arch. No pneumothorax. Bones demineralized. IMPRESSION: Persistent CHF with bibasilar effusions and atelectasis. Electronically Signed   By: Ulyses Southward M.D.   On: 10/16/2017 09:45        Scheduled Meds: . aspirin EC  81 mg Oral Daily  . atorvastatin  40 mg Oral q1800  . carvedilol  25 mg Oral BID WC  . cephALEXin  250 mg Oral Q12H  . clopidogrel  75 mg Oral Q breakfast  . cyclobenzaprine  10 mg Oral TID  . enoxaparin (LOVENOX) injection  30 mg Subcutaneous Daily  . furosemide  40 mg Oral Daily  . hydrALAZINE  50 mg Oral Q8H  . insulin aspart  0-9 Units Subcutaneous TID WC  . insulin glargine  15 Units Subcutaneous QHS  . isosorbide mononitrate  30  mg Oral  Daily  . linagliptin  5 mg Oral Daily  . loratadine  10 mg Oral Daily  . pantoprazole  40 mg Oral Daily  . sodium chloride flush  3 mL Intravenous Q12H   Continuous Infusions: . sodium chloride       LOS: 13 days    Time spent: over 30 min    Lacretia Nicks, MD [facility] Pager [phone]  If 7PM-7AM, please contact night-coverage [email or web address] Password [facility] 10/17/2017, 4:38 PM

## 2017-10-17 NOTE — Progress Notes (Signed)
Physical Therapy Treatment Patient Details Name: Emily Miranda MRN: [id number] DOB: 10-17-1940 Today's Date: 10/17/2017    History of Present Illness Pt is a 77 y/o female with a PMH significant for chronic combined systolic and diastolic CHF last EF measured in March 2018 was 40 to 45%, recent cardiac cath with stent placement, IDDM, CKDIII, hypertension and charcot foot on the right side plan to have right AKA at [facility] end of June 2019. Pt presents with sudden onset of SOB. She was found to be in acute respiratory failure with hypoxia. She initially required BiPAP and transitioned to [place] supplemental O2 2L/min. Pt reports on 6-7 that AKA has been cancelled     PT Comments    Session focused on O2 evaluation, Pt SpO2% 91-92 at rest on RA with desat to 88% on RA with 18 ft ambulation. On 2L via [place], pt spO2 94% at rest and with 12 ft ambulation. Pt would likely benefit from SNF placement but pt and spouse refuse. Pt also states that she does not plan to use O2 at home even if recommended. Frequency decreased to 2x per week to maximize functional transfers.    Follow Up Recommendations  Home health PT;Supervision/Assistance - 24 hour(Pt likely to benefit from SNF but pt and spouse refusing)     Equipment Recommendations  None recommended by PT    Recommendations for Other Services       Precautions / Restrictions Precautions Precautions: Fall Precaution Comments: R foot deformity - Planned AKA has been cancelled per pt report Restrictions Weight Bearing Restrictions: No    Mobility  Bed Mobility Overal bed mobility: Needs Assistance Bed Mobility: Supine to Sit;Sit to Supine     Supine to sit: Supervision     General bed mobility comments: HOB elevated; supervision for safety  Transfers Overall transfer level: Needs assistance Equipment used: Rolling walker (2 wheeled) Transfers: Sit to/from Stand Sit to Stand: Min guard         General transfer comment: Pt able to  perform sit>stand min guard with increased time and effort.   Ambulation/Gait Ambulation/Gait assistance: +2 safety/equipment;Min guard Ambulation Distance (Feet): 18 Feet Assistive device: Rolling walker (2 wheeled) Gait Pattern/deviations: Step-to pattern;Decreased step length - right;Decreased stance time - right Gait velocity: slow   General Gait Details: 2 gait trials: 18 ft, 12 ft. Pt ambulates slowly min guard for safety. Pt reports that LLE has hx of buckling. No buckling noted today   Stairs             Wheelchair Mobility    Modified Rankin (Stroke Patients Only)       Balance Overall balance assessment: Needs assistance Sitting-balance support: No upper extremity supported;Feet unsupported Sitting balance-Leahy Scale: Good     Standing balance support: Bilateral upper extremity supported;During functional activity Standing balance-Leahy Scale: Poor Standing balance comment: Bilateral UE support throughout session when standing                            Cognition Arousal/Alertness: Awake/alert Behavior During Therapy: WFL for tasks assessed/performed Overall Cognitive Status: Within Functional Limits for tasks assessed                                 General Comments: Pt has family in room and states that she does not plan to wear O2 at home and is not interested in SNF placement  Exercises      General Comments        Pertinent Vitals/Pain Pain Assessment: No/denies pain    Home Living                      Prior Function            PT Goals (current goals can now be found in the care plan section) Acute Rehab PT Goals Patient Stated Goal: To go home PT Goal Formulation: With patient Time For Goal Achievement: 10/31/17 Potential to Achieve Goals: Fair Progress towards PT goals: Progressing toward goals    Frequency    Min 2X/week      PT Plan Frequency needs to be updated;Current plan  remains appropriate    Co-evaluation              AM-PAC PT "6 Clicks" Daily Activity  Outcome Measure  Difficulty turning over in bed (including adjusting bedclothes, sheets and blankets)?: None Difficulty moving from lying on back to sitting on the side of the bed? : A Little Difficulty sitting down on and standing up from a chair with arms (e.g., wheelchair, bedside commode, etc,.)?: A Little Help needed moving to and from a bed to chair (including a wheelchair)?: A Little Help needed walking in hospital room?: A Little Help needed climbing 3-5 steps with a railing? : A Lot 6 Click Score: 18    End of Session Equipment Utilized During Treatment: Gait belt Activity Tolerance: Patient tolerated treatment well Patient left: in bed;with bed alarm set;with family/visitor present Nurse Communication: Other (comment);Mobility status(purewick and O2 removal) PT Visit Diagnosis: Unsteadiness on feet (R26.81);Pain;Muscle weakness (generalized) (M62.81);Repeated falls (R29.6);Difficulty in walking, not elsewhere classified (R26.2) Pain - Right/Left: Left Pain - part of body: Shoulder     Time: 1115-5208 PT Time Calculation (min) (ACUTE ONLY): 23 min  Charges:  $Gait Training: 8-22 mins $Therapeutic Activity: 8-22 mins                    G Codes:       Gabe Yurika Pereda, SPT   [employer] 10/17/2017, 2:34 PM

## 2017-10-17 NOTE — Progress Notes (Signed)
Straight cath completed, MD [place] at bedside aware pt retaining Urine was thick, cloudy and malodorous

## 2017-10-17 NOTE — Progress Notes (Signed)
MD aware of pt retaining , verbal order from MD to place foley catheter

## 2017-10-17 NOTE — Progress Notes (Signed)
PCCM Note  I discussed case with Dr Lowell Guitar today. Pt was admitted with dyspnea, hypoxemia and suspected AE-CHF. She has improved with diuresis, still requiring O2. Unfortunately she was unable to do PFT's effectively.   I've made her an appointment to see Dr Isaiah Serge in office as a new consult to assess, possibly repeat the PFT, determine her O2 needs.   Levy Pupa, MD, PhD 10/17/2017, 3:17 PM [facility] [phone] or if no answer [phone]

## 2017-10-17 NOTE — Progress Notes (Signed)
SATURATION QUALIFICATIONS: (This note is used to comply with regulatory documentation for home oxygen)  Patient Saturations on Room Air at Rest = 83%  Patient Saturations on Room Air while Ambulating = n/a  Patient Saturations on 2 Liters of oxygen while at rest = 94%

## 2017-10-17 NOTE — Progress Notes (Signed)
Dressing change completed  Bilateral legs cleaned with NS, placed xeroform guaze on, 4x4s, ABD pad and wrapped in xerlex.  Site clean dry and intact

## 2017-10-17 NOTE — Progress Notes (Addendum)
Progress Note  Patient Name: Emily Miranda Date of Encounter: 10/17/2017  Primary Cardiologist: Rollene Rotunda, MD   Subjective   Patient continues to feel back to baseline. Nursing at bedside, bladder scan with >800 cc in her bladder. Straight cath performed. Patient reports always leaking and sensation of inadequate bladder emptying on occasion.  Inpatient Medications    Scheduled Meds: . aspirin EC  81 mg Oral Daily  . atorvastatin  40 mg Oral q1800  . carvedilol  25 mg Oral BID WC  . cephALEXin  250 mg Oral Q12H  . clopidogrel  75 mg Oral Q breakfast  . cyclobenzaprine  10 mg Oral TID  . enoxaparin (LOVENOX) injection  30 mg Subcutaneous Daily  . hydrALAZINE  50 mg Oral Q8H  . insulin aspart  0-9 Units Subcutaneous TID WC  . insulin glargine  15 Units Subcutaneous QHS  . isosorbide mononitrate  30 mg Oral Daily  . linagliptin  5 mg Oral Daily  . loratadine  10 mg Oral Daily  . pantoprazole  40 mg Oral Daily  . sodium chloride flush  3 mL Intravenous Q12H   Continuous Infusions: . sodium chloride     PRN Meds: sodium chloride, acetaminophen **OR** acetaminophen, dextromethorphan, fluticasone, morphine injection, nitroGLYCERIN, ondansetron **OR** ondansetron (ZOFRAN) IV, polyethylene glycol, sodium chloride flush, traMADol   Vital Signs    Vitals:   10/16/17 0446 10/16/17 1800 10/16/17 1925 10/17/17 0402  BP: (!) 160/66 (!) 161/78 (!) 150/73 (!) 159/73  Pulse: 72 70 73 75  Resp:      Temp: 98.7 F (37.1 C)  98.2 F (36.8 C) 98.2 F (36.8 C)  TempSrc: Oral  Oral Oral  SpO2: 100%  100% 100%  Weight: 156 lb 8 oz (71 kg)   156 lb (70.8 kg)  Height:        Intake/Output Summary (Last 24 hours) at 10/17/2017 1122 Last data filed at 10/17/2017 0900 Gross per 24 hour  Intake 420 ml  Output -  Net 420 ml   Filed Weights   10/15/17 0506 10/16/17 0446 10/17/17 0402  Weight: 152 lb 11.2 oz (69.3 kg) 156 lb 8 oz (71 kg) 156 lb (70.8 kg)   Telemetry    NSR with  PR prolongation- Personally Reviewed  ECG    No new EKG for review - Personally Reviewed  Physical Exam   General: Well nourished female in no acute distress Pulm: Good air movement with no wheezing or crackles  CV: RRR, systolic murmur Abdomen: Active bowel sounds, soft, non-distended, no tenderness to palpation  Extremities: No LE edema  Skin: Warm and dry  Neuro: Alert and oriented x 3  Labs    Chemistry Recent Labs  Lab 10/15/17 0532 10/16/17 0432 10/17/17 0557  NA 140 139 140  K 4.4 4.5 4.5  CL 113* 112* 112*  CO2 23 23 22   GLUCOSE 116* 126* 134*  BUN 57* 50* 46*  CREATININE 2.30* 1.95* 1.77*  CALCIUM 8.3* 8.4* 8.5*  GFRNONAA 20* 24* 27*  GFRAA 23* 28* 31*  ANIONGAP 4* 4* 6    Hematology Recent Labs  Lab 10/11/17 0533 10/16/17 0432 10/17/17 0557  WBC 7.2 4.4 4.6  RBC 3.11* 3.21* 3.49*  HGB 8.0* 8.0* 8.6*  HCT 26.5* 27.2* 29.6*  MCV 85.2 84.7 84.8  MCH 25.7* 24.9* 24.6*  MCHC 30.2 29.4* 29.1*  RDW 16.6* 16.4* 16.5*  PLT 245 344 383   Cardiac EnzymesNo results for input(s): TROPONINI in the last 168  hours. No results for input(s): TROPIPOC in the last 168 hours.   BNP No results for input(s): BNP, PROBNP in the last 168 hours.   DDimer No results for input(s): DDIMER in the last 168 hours.   Radiology    Dg Chest 2 View  Result Date: 10/16/2017 CLINICAL DATA:  Hypoxia; history coronary disease post MI and coronary stenting, CHF, hypertension, type II diabetes mellitus EXAM: CHEST - 2 VIEW COMPARISON:  10/12/2017 FINDINGS: Enlargement of cardiac silhouette. Mediastinal contours normal. Mild pulmonary vascular congestion. Interstitial infiltrates favoring pulmonary edema slightly improved at RIGHT base and slightly increased in LEFT perihilar region since previous exam. Bibasilar pleural effusions and atelectasis. Atherosclerotic calcification aortic arch. No pneumothorax. Bones demineralized. IMPRESSION: Persistent CHF with bibasilar effusions and  atelectasis. Electronically Signed   By: Ulyses Southward M.D.   On: 10/16/2017 09:45   Cardiac Studies   Cardiac Cath 10/09/2017  Conclusion   Moderate pulmonary hypertension -mean PA pressure 35 mmHg.  Diffuse three-vessel coronary disease with widely patent stents.  Left main is widely patent  Mid LAD contains a long region of stent which is widely patent. Proximal and distal to the stent there is diffuse coronary disease with up to 70% narrowing. This vessel is significantly improved since the pre-PCI comparison from June 2018.  Widely patent stent in the proximal to mid circumflex. Moderate diffuse disease is noted beyond the stent.  Widely patent stent in the mid RCA and in the proximal portion of the PDA. Diffuse disease in the distal RCA improved compared to prior angiogram June 2018.  Normal left ventricular end-diastolic pressure.  Mild to moderate gradient across the aortic valve with calculated aortic valve area 1.48 cm  RECOMMENDATIONS:   Further management per treating team.   2D echo 10/07/2017 Study Conclusions - Left ventricle: The cavity size was normal. There was mild concentric hypertrophy. Systolic function was mildly reduced. The estimated ejection fraction was in the range of 45% to 50%. Hypokinesis of the basal-midinferoseptal myocardium. Doppler parameters are consistent with abnormal left ventricular relaxation (grade 1 diastolic dysfunction). Doppler parameters are consistent with high ventricular filling pressure. - Aortic valve: There was moderate stenosis. There was moderate regurgitation. Peak velocity (S): 365 cm/s. Mean gradient (S): 26 mm Hg. Valve area (VTI): 0.85 cm^2. Valve area (Vmax): 0.84 cm^2. Valve area (Vmean): 0.81 cm^2. Regurgitation pressure half-time: 379 ms. - Mitral valve: Transvalvular velocity was within the normal range. There was no evidence for stenosis. There was mild regurgitation. - Left  atrium: The atrium was mildly dilated. - Right ventricle: The cavity size was normal. Wall thickness was normal. Systolic function was normal. - Right atrium: The atrium was severely dilated. - Tricuspid valve: There was mild regurgitation. - Pulmonary arteries: Systolic pressure was severely increased. PA peak pressure: 60 mm Hg (S).  Patient Profile     77 y.o. female with PMH of chronic combined CHF (EF 40-45%), mild-mod AS, CAD s/p PCI to RCA 10/2016 and LAD/ Cx 07/2017, HTN, DM type 2, CKD stage 3, amnemia, R charcot's deformity with plans for ADA at [facility], who presented with SOB. Cardiology following for acute on chronic CHF and worsening AS on echocardiogram.  Assessment & Plan    HFpEF, Acute exacerbation  - NYHA class III at baseline  - Echocardiogram with LVEF of 45-50% and new septal hypokinesis, G1DD, RAE, LAE, and advancement in her AS.  - Right/left heart cath illustrated diffuse 3 vessel disease and widely patent stents along with elevated PA pressures  but normal LVED pressures - Net negative 3.9 L since admission - Renal function improving with stable Mg and potassium  - Hold lasix due to urinary rentention outlined below - Continue hydralazine, imdur, and carvedilol   Type 2 vs Type 3 Pulmonary HTN  - PA pressure 35 mmHg on right heart cath  - PFT more indicative of restrictive lung physiology than obstructive but it was a poor study.    Urinary Retention  - Bladder scan with >800 cc. Straight cath preformed.  - Medications reviewed no medications listed that could result in urinary retention  - Renal ultrasound on 5/31 with no hydronephrosis, however, review of images does show a distended bladder with >1000 cc calculated  - Likely acute urinary retention given lack of hydronephrosis. Would hold off on Lasix as she should auto-diurese and also need to consider placing foley with urology follow-up as she may have stretch injury   AS - Mean gradient 26 mmHg -  Continue routine monitoring   CAD s/p multiple PCI -  Right/left heart cath illustrated diffuse 3 vessel disease and widely patent stents - Continue ASA, plavix, atorvastatin, carvedilol, and imdur  - Restart Lisinopril on discharge   AKI on CKD Stage IV - Creatinine back to baseline - Continue holding lasix and lisinopril   For questions or updates, please contact [facility] Please consult [email or web address] for contact info under Cardiology/STEMI.     Signed, Levora Dredge, MD  10/17/2017, 11:22 AM    Attending Note:   The patient was seen and examined.  Agree with assessment and plan as noted above.  Changes made to the above note as needed.  Patient seen and independently examined with Levora Dredge, PGY-1.   We discussed all aspects of the encounter. I agree with the assessment and plan as stated above.  1.  Pulmonary hypertension: The patient appears to have restrictive lung disease by pulmonary function test but it should be noted that the pulmonary function test was a relatively poor study.  She had difficulty in performing some of the tasks. She is diuresed and is overall feeling better.  2.  Aortic stenosis:   Stable  3.   CAD - no angina at present     I have spent a total of 40 minutes with patient reviewing hospital  notes , telemetry, EKGs, labs and examining patient as well as establishing an assessment and plan that was discussed with the patient. > 50% of time was spent in direct patient care.    Vesta Mixer, Montez Hageman., MD, [facility] 10/17/2017, 1:41 PM [address]  [address] Office [phone] Pager [phone]

## 2017-10-17 NOTE — Care Management Important Message (Signed)
Important Message  Patient Details  Name: ALIRA SQUICCIARINI MRN: [id number] Date of Birth: 1940/10/29   Medicare Important Message Given:  Yes    Denee Boeder P Theresa Wedel 10/17/2017, 3:30 PM

## 2017-10-18 LAB — CBC
HCT: 29.1 % — ABNORMAL LOW (ref 36.0–46.0)
Hemoglobin: 8.4 g/dL — ABNORMAL LOW (ref 12.0–15.0)
MCH: 24.7 pg — ABNORMAL LOW (ref 26.0–34.0)
MCHC: 28.9 g/dL — ABNORMAL LOW (ref 30.0–36.0)
MCV: 85.6 fL (ref 78.0–100.0)
Platelets: 384 10*3/uL (ref 150–400)
RBC: 3.4 MIL/uL — ABNORMAL LOW (ref 3.87–5.11)
RDW: 16.6 % — ABNORMAL HIGH (ref 11.5–15.5)
WBC: 6.3 10*3/uL (ref 4.0–10.5)

## 2017-10-18 LAB — BASIC METABOLIC PANEL
Anion gap: 6 (ref 5–15)
BUN: 42 mg/dL — ABNORMAL HIGH (ref 6–20)
CO2: 23 mmol/L (ref 22–32)
Calcium: 8.5 mg/dL — ABNORMAL LOW (ref 8.9–10.3)
Chloride: 111 mmol/L (ref 101–111)
Creatinine, Ser: 1.68 mg/dL — ABNORMAL HIGH (ref 0.44–1.00)
GFR calc Af Amer: 33 mL/min — ABNORMAL LOW (ref >60)
GFR calc non Af Amer: 29 mL/min — ABNORMAL LOW (ref >60)
Glucose, Bld: 128 mg/dL — ABNORMAL HIGH (ref 65–99)
Potassium: 4.5 mmol/L (ref 3.5–5.1)
Sodium: 140 mmol/L (ref 135–145)

## 2017-10-18 LAB — GLUCOSE, CAPILLARY
Glucose-Capillary: 119 mg/dL — ABNORMAL HIGH (ref 65–99)
Glucose-Capillary: 120 mg/dL — ABNORMAL HIGH (ref 65–99)
Glucose-Capillary: 124 mg/dL — ABNORMAL HIGH (ref 65–99)
Glucose-Capillary: 162 mg/dL — ABNORMAL HIGH (ref 65–99)

## 2017-10-18 LAB — MAGNESIUM: Magnesium: 2 mg/dL (ref 1.7–2.4)

## 2017-10-18 MED ORDER — CEPHALEXIN 250 MG PO CAPS: 250.0000 mg | ORAL_CAPSULE | Freq: Two times a day (BID) | ORAL | 0 refills | Status: AC

## 2017-10-18 MED ORDER — TAMSULOSIN HCL 0.4 MG PO CAPS: 0.4000 mg | ORAL_CAPSULE | Freq: Every day | ORAL | 0 refills | Status: AC

## 2017-10-18 MED ORDER — CARVEDILOL 25 MG PO TABS: 25.0000 mg | ORAL_TABLET | Freq: Two times a day (BID) | ORAL | 0 refills | Status: AC

## 2017-10-18 NOTE — Discharge Summary (Signed)
Physician Discharge Summary  Emily Emily Miranda QQV:[id number] DOB: [birth year] DOA: 10/04/2017  PCP: Emily Saupe, MD  Admit date: 10/04/2017 Discharge date: 10/18/2017  Time spent: 45 minutes  Recommendations for Outpatient Follow-up:  1. Follow up outpatient CBC/CMP 2. Follow up renal function - Home dose of lasix restarted on discharge (had AKI which seems like it was related to IV lasix and possibly contrast with cardiac cath as well, but most recent imaging still with some evidence of CHF).  Holding lisinopril due to CKD and risk of AKI.  Follow volume status and kidney function to determine long term plan for diuretics. 3. Pt with pulmonary hypertension on cath.  PFT's poor quality, but possibly with restrictive disease.  Outpatient pulm follow up arranged to follow up. 4. Sent home with Emily Miranda, continue to wean as tolerated at outpatient visits 5. Pt with acute urinary retention.  Foley catheter placed and started on flomax.  Has outpatient urology scheduled for 6/18. 6. Pt was to have BKA next week at [facility], but pt is not good candidate for surgery next week with recent complications.  She also had PCI on 07/2017 and she needs DAPT for at least 6 months and preferably 1 year prior to any elective surgery.  Continue to follow up with outpatient providers to plan timing for surgery. 7. Recheck thyroid function tests in 6-8 weeks  8. Recommend palliative care to follow as outpatient   Discharge Diagnoses:  Principal Problem:   Acute on chronic combined systolic and diastolic HF (heart failure) (HCC) Active Problems:   DM type 2 causing CKD stage 3 (HCC)   Mild aortic stenosis   Hypertensive cardiomyopathy, with heart failure (HCC)   Acute combined systolic and diastolic congestive heart failure (HCC)   PVD (peripheral vascular disease) (HCC)   CAD S/P percutaneous coronary angioplasty   Charcot foot due to diabetes mellitus (HCC)   Unstable angina (HCC)   Acute kidney injury (HCC)    CHF (congestive heart failure) (HCC)   Acute respiratory failure with hypoxia (HCC)   Pressure injury of skin   DCM (dilated cardiomyopathy) (HCC)   Nonrheumatic aortic valve stenosis   Palliative care by specialist   Goals of care, counseling/discussion   Acute pulmonary edema (HCC)   Discharge Condition: stable  Diet recommendation: heart healthy  Filed Weights   10/16/17 0446 10/17/17 0402 10/18/17 0430  Weight: 71 kg (156 lb 8 oz) 70.8 kg (156 lb) 70.8 kg (156 lb)    History of present illness:  Emily Emily Miranda a76 y.o.femalewith Emily Emily Miranda history of chronic combined CHF, AS, CAD s/p PCI, T2DM, stage III CKD, anemia, HTN and right foot charcot's deformity (with plans for AKA at [facility]) who presented with 1 day of abrupt onset dyspnea. She was found to be hypoxic requiring BiPAP, transitioned to nasal cannula. Diuresis was started with improvement and CXR f/u showed improved edema, although she still required oxygen despite weight being down below previous DC weight. Echocardiogram demonstrated stable EF 45-50%, basal midinferoseptal hypokinesis, G1DD, severe RAE, mild LAE, and dilated/blunted IVF as well as advancement of aortic stenosis. She underwent heart cath 5/30 showed patent coronary stents, moderate pulm HTN. Hospitalization further complicated by AKI.  Her lasix was held as well as lisinopril and her renal function has gradually improved.  Her lasix was resumed on discharge at the previous home dose.  She had PFT's due to her pulmonary hypertension which showed possible restrictive disease (but the PFT's were poor quality).  She was  treated for Emily Emily Miranda klebsiella UTI with keflex and she required foley catheter placement for acute urinary retention.    See below for additional details  Hospital Course:  Acute hypoxic respiratory failure: Due to CHF exacerbation - Continue Emily Emily Miranda as needed - Discharged with home Emily Miranda - PFT's ordered -> possible restrictive disease, minimal obstructive  airway disease.  Limited as pt unable to perform certain components of testing - Outpatient pulmonology follow up arranged    Acute on chronic combined systolic and diastolic CHF  Pulmonary Hypertension -S/p heart cath 5/30 showed patent coronary stents, moderate pulm HTN  - echo 5/28 with EF 45-50%, grade 1 diastolic dysfunction, hypokinesis of basal-midinferoseptal myocardium (see report) -Daily weights, I/O. Diuresed 164 > 150 lbs then started IVF's for creat bump>2.0, now improved -Diuretics, ACE on hold due to renal function - resuming lasix at discharge -Continue coreg, hydralazine, imdur -Cardiology followingand recommended PFT's for pulmonary hypertension (possibly restrictive, but poor study - outpatient follow up recommended) - renal function improved today.   - Lasix resumed at previous home dose at discharge   - We've been limited in ability to diurese with kidney function.  Will need continued close follow up after discharge.  Wt Readings from Last 3 Encounters:  10/18/17 70.8 kg (156 lb)  09/04/17 69.3 kg (152 lb 11.2 oz)  08/28/17 72.6 kg (160 lb)    AKI on stage IV CKD - progressive CKD over last few yearswcreat 1.5 in 2018 and creat 1.6- 1.9 in2019 - Peaked to 2.57.  Improving today to ~1.68 - Resume home lasix at discharge, needs close follow up -suspect cardiorenal componentfor CKD/ AKI as well as possibly contrast related - pt also with urinary retention, but no evidence of hydro on [place] from 5/30 - Previous provider spoke w/ pt and family, pt does not want any dialysis, she has had family and friends on dialysis and doesn't want to go through that; also would be Emily Emily Miranda poor candidate given comorbidities and heart disease; family agrees.  She reiterated this to me as well.  Acute Urinary Retention: - foley catheter placed - being treated for UTI  - Start flomax - follow up with urology arranged   CAD s/p DES x2 June 2018 and DES March 2019 -Continue coreg  , ASA, plavix, statin -Will need DAPT for at least 6 months, preferably 1 year   Aortic stenosis -S/p heart cath revealed mild-moderate AS with mean aortic valve gradient and aortic valve area 1.48 cm2  Charcot deformity of right foot with pressure ulcer - WOC consulted, following local wound care recommendations.  - Follow up with [facility] orthopedics -patient had surgery/ BKA scheduled next week at [facility], however per cardiology patient is not likely Emily Emily Miranda candidate for surgerynext week given her medical statusas well as recent PCI in March of this year.Will need DAPT for at least 6 months, preferably 1 year prior to any elective surgery.  Insulin-dependent T2DM  -Lantus, SSI, tradjenta  Anemia of chronic disease -Received 1u PRBCs earlier this admission -Monitor CBC   Subclinical hypothyroidism -TSH 8.872, with normal free T4 1.02  -Suggest recheck labs in 4-6 weeks  Lower ext edema - probably R heart failure associated with pulm HTN  Klebsiella UTI: keflex course prescribed to continue at discharge.  EOL - patient has signed DNR form at home, filled it out while in aSNF in [place] - confirmed with pt's family/ daughter/ husband - DNR order written  Deconditioning: PT recommending home health, but daughter concerned that  pt's husband cannot provide 24/7 care that she needs at home.  Will ask PT to evaluate again with this in mind.  She tells me she'd potentially be willing to go to SNF.  - Sounds like palliative discussion, pt prefers to go home, will work on home health   Goals of Care: asked palliative to see her to assist with goals of care.  Appreciate recs.  They confirm DNR and note she prefers to go home (and not to SNF).   Procedures: Right and Left Heart Cath  Moderate pulmonary hypertension -mean PA pressure 35 mmHg.  Diffuse three-vessel coronary disease with widely patent stents.  Left main is widely patent  Mid LAD contains Emily Emily Miranda long region of  stent which is widely patent.  Proximal and distal to the stent there is diffuse coronary disease with up to 70% narrowing.  This vessel is significantly improved since the pre-PCI comparison from June 2018.  Widely patent stent in the proximal to mid circumflex.  Moderate diffuse disease is noted beyond the stent.  Widely patent stent in the mid RCA and in the proximal portion of the PDA.  Diffuse disease in the distal RCA improved compared to prior angiogram June 2018.  Normal left ventricular end-diastolic pressure.  Mild to moderate gradient across the aortic valve with calculated aortic valve area 1.48 cm  RECOMMENDATIONS:   Further management per treating team.  Echo Study Conclusions  - Left ventricle: The cavity size was normal. There was mild   concentric hypertrophy. Systolic function was mildly reduced. The   estimated ejection fraction was in the range of 45% to 50%.   Hypokinesis of the basal-midinferoseptal myocardium. Doppler   parameters are consistent with abnormal left ventricular   relaxation (grade 1 diastolic dysfunction). Doppler parameters   are consistent with high ventricular filling pressure. - Aortic valve: There was moderate stenosis. There was moderate   regurgitation. Peak velocity (S): 365 cm/s. Mean gradient (S): 26   mm Hg. Valve area (VTI): 0.85 cm^2. Valve area (Vmax): 0.84 cm^2.   Valve area (Vmean): 0.81 cm^2. Regurgitation pressure half-time:   379 ms. - Mitral valve: Transvalvular velocity was within the normal range.   There was no evidence for stenosis. There was mild regurgitation. - Left atrium: The atrium was mildly dilated. - Right ventricle: The cavity size was normal. Wall thickness was   normal. Systolic function was normal. - Right atrium: The atrium was severely dilated. - Tricuspid valve: There was mild regurgitation. - Pulmonary arteries: Systolic pressure was severely increased. PA   peak pressure: 60 mm Hg  (S).  Consultations:  cardiology  Discharge Exam: Vitals:   10/18/17 0430 10/18/17 1204  BP: (!) 150/70 137/73  Pulse: 66 68  Resp: 14 20  Temp: 97.9 F (36.6 C) (!) 97.5 F (36.4 C)  SpO2: 90% 96%   Feeling well.  No complaints.  Ready to go home.  No excited about the foley.  Had discussion about importance of this.  General: No acute distress. Cardiovascular: Heart sounds show Emily Emily Miranda regular rate, and rhythm. No gallops or rubs. No murmurs. No JVD. Lungs: Clear to auscultation bilaterally with good air movement. No rales, rhonchi or wheezes. Abdomen: Soft, nontender, nondistended with normal active bowel sounds. No masses. No hepatosplenomegaly. Neurological: Alert and oriented 3. Moves all extremities 4. Cranial nerves II through XII grossly intact. Skin: Warm and dry. No rashes or lesions. Extremities: Bilateral LE dressed, charcot deformity to R foot Psychiatric: Mood and affect are normal.  Insight and judgment are appropriate.   Discharge Instructions   Discharge Instructions    (HEART FAILURE PATIENTS) Call MD:  Anytime you have any of the following symptoms: 1) 3 pound weight gain in 24 hours or 5 pounds in 1 week 2) shortness of breath, with or without Ranen Doolin dry hacking cough 3) swelling in the hands, feet or stomach 4) if you have to sleep on extra pillows at night in order to breathe.   Complete by:  As directed    Avoid straining   Complete by:  As directed    Call MD for:  difficulty breathing, headache or visual disturbances   Complete by:  As directed    Call MD for:  extreme fatigue   Complete by:  As directed    Call MD for:  hives   Complete by:  As directed    Call MD for:  persistant dizziness or light-headedness   Complete by:  As directed    Call MD for:  persistant nausea and vomiting   Complete by:  As directed    Call MD for:  redness, tenderness, or signs of infection (pain, swelling, redness, odor or green/yellow discharge around incision site)    Complete by:  As directed    Call MD for:  severe uncontrolled pain   Complete by:  As directed    Call MD for:  temperature >100.4   Complete by:  As directed    Diet - low sodium heart healthy   Complete by:  As directed    Diet - low sodium heart healthy   Complete by:  As directed    Discharge instructions   Complete by:  As directed    You were seen for heart failure.  We used lasix to take extra fluid off, but your kidney function limited how much we could continue to diurese you.  We held the lasix for the past several days and your kidney function has improved.    You needed Mcgwire Dasaro catheter to be placed for urinary retention.  Please follow up with urology for this.  We started Emily Emily Miranda medicine called flomax that hopefully will help as well.    You have 5 more days of antibiotics to take for your UTI (keflex).  We are restarting your lasix at discharge.  Please follow up with your PCP to recheck your kidney function within Emily Emily Miranda few days to make sure this is okay to continue.  Stop your lisinopril for now.  This may be restarted at some point in the future if your kidney function is stable.  You have an appointment with the lung doctors on Monday because of your abnormal lung function tests.  They may repeat the testing at that time.  You will need repeat thyroid testing at some time.   It's too early for your surgery that is scheduled next week.  Please follow up with your PCP and outpatient doctors to plan Donyel Nester safe time to get this done.   Return for new, recurrent, or worsening symptoms.  Please ask your PCP to request records from this hospitalization so they know what was done and what the next steps will be.   Heart Failure patients record your daily weight using the same scale at the same time of day   Complete by:  As directed    Increase activity slowly   Complete by:  As directed    Increase activity slowly   Complete by:  As directed  Allergies as of 10/18/2017       Reactions   Levofloxacin Nausea And Vomiting, Other (See Comments)   Confusion   Amlodipine Other (See Comments)   Localized edema   Gabapentin Other (See Comments)   Disoriented, no strength in legs   Hydrochlorothiazide Other (See Comments)   Hot and disoriented   Ibuprofen Other (See Comments)   Affected kidneys - stopped flow of urine Reaction to Advil   Metformin And Related Other (See Comments)   Affected kidneys - stopped flow of urine   Simvastatin Other (See Comments)   Chest pain      Medication List    STOP taking these medications   lisinopril 30 MG tablet Commonly known as:  PRINIVIL,ZESTRIL     TAKE these medications   aspirin EC 81 MG tablet Take 1 tablet (81 mg total) by mouth daily.   atorvastatin 40 MG tablet Commonly known as:  LIPITOR Take 1 tablet (40 mg total) by mouth daily at 6 PM.   carvedilol 25 MG tablet Commonly known as:  COREG Take 1 tablet (25 mg total) by mouth 2 (two) times daily with [place] meal. What changed:    medication strength  how much to take   cephALEXin 250 MG capsule Commonly known as:  KEFLEX Take 1 capsule (250 mg total) by mouth every 12 (twelve) hours for 5 days.   clopidogrel 75 MG tablet Commonly known as:  PLAVIX Take 1 tablet (75 mg total) by mouth daily with breakfast.   collagenase ointment Commonly known as:  SANTYL Apply topically daily. Apply to LE plantar surface daily.   cyclobenzaprine 10 MG tablet Commonly known as:  FLEXERIL Take 10 mg by mouth 3 (three) times daily.   diclofenac sodium 1 % Gel Commonly known as:  VOLTAREN Apply 2 g topically 4 (four) times daily. Please apply to let hip.   fexofenadine 180 MG tablet Commonly known as:  ALLEGRA Take 180 mg by mouth daily.   fluticasone 50 MCG/ACT nasal spray Commonly known as:  FLONASE Place 1 spray into both nostrils daily as needed (seasonal allergies).   furosemide 40 MG tablet Commonly known as:  LASIX Take 1 tablet (40 mg total) by  mouth daily.   glimepiride 4 MG tablet Commonly known as:  AMARYL Take 2-4 mg by mouth See admin instructions. Take 1 tablet by mouth every morning, and 1/2 tablet every evening   HUMALOG KWIKPEN 100 UNIT/ML KiwkPen Generic drug:  insulin lispro Inject 2 Units into the skin daily with supper.   hydrALAZINE 25 MG tablet Commonly known as:  APRESOLINE Take 25 mg by mouth every 8 (eight) hours.   HYDROcodone-acetaminophen 5-325 MG tablet Commonly known as:  NORCO/VICODIN Take 1 tablet by mouth every 6 (six) hours as needed for moderate pain.   insulin glargine 100 UNIT/ML injection Commonly known as:  LANTUS Inject 0.12 mLs (12 Units total) into the skin at bedtime. What changed:  how much to take   isosorbide mononitrate 30 MG 24 hr tablet Commonly known as:  IMDUR Take 1 tablet (30 mg total) by mouth daily.   linagliptin 5 MG Tabs tablet Commonly known as:  TRADJENTA Take 5 mg by mouth daily.   mupirocin cream 2 % Commonly known as:  BACTROBAN Apply 1 application topically 2 (two) times daily.   nitroGLYCERIN 0.4 MG SL tablet Commonly known as:  NITROSTAT Place 1 tablet (0.4 mg total) under the tongue every 5 (five) minutes x 3 doses as needed  for chest pain.   pantoprazole 40 MG tablet Commonly known as:  PROTONIX Take 1 tablet (40 mg total) by mouth daily.   polyethylene glycol packet Commonly known as:  MIRALAX / GLYCOLAX Take 17 g by mouth daily as needed (constipation). Mix in 8 oz liquid and drink   silver sulfADIAZINE 1 % cream Commonly known as:  SILVADENE Apply topically daily. Apply to right lower extremities affected areas, not to the plantar region   SYSTANE OP Place 1 drop into both eyes daily as needed (dry eyes).   tamsulosin 0.4 MG Caps capsule Commonly known as:  FLOMAX Take 1 capsule (0.4 mg total) by mouth daily after supper.   vitamin C 1000 MG tablet Take 1,000 mg by mouth daily.            Durable Medical Equipment  (From  admission, onward)        Start     Ordered   10/17/17 1452  For home use only DME 3 n 1  Once     10/17/17 1451   10/17/17 1350  For home use only DME oxygen  Once    Question Answer Comment  Mode or (Route) Nasal cannula   Liters per Minute 2   Frequency Continuous (stationary and portable oxygen unit needed)   Oxygen conserving device No   Oxygen delivery system Gas      10/17/17 1350   10/17/17 1311  For home use only DME Hospital bed  Once    Question Answer Comment  Patient has (list medical condition): CHF, weakness, deconditioning   The above medical condition requires: Patient requires the ability to reposition frequently   Head must be elevated greater than: 30 degrees   Bed type Semi-electric   [employer] Yes   Trapeze Bar Yes   Support Surface: Gel Overlay      10/17/17 1314     Allergies  Allergen Reactions  . Levofloxacin Nausea And Vomiting and Other (See Comments)    Confusion  . Amlodipine Other (See Comments)    Localized edema  . Gabapentin Other (See Comments)    Disoriented, no strength in legs  . Hydrochlorothiazide Other (See Comments)    Hot and disoriented  . Ibuprofen Other (See Comments)    Affected kidneys - stopped flow of urine Reaction to Advil  . Metformin And Related Other (See Comments)    Affected kidneys - stopped flow of urine  . Simvastatin Other (See Comments)    Chest pain   Follow-up Information    [facility] Follow up.   Specialty:  Home Health Services Why:  They will call you to set up visit Contact information: [address] [place] [address] [phone]        Chilton Greathouse, MD Follow up on 11/19/2017.   Specialty:  Pulmonary Disease Why:  2:30 pm Contact information: [address] [place] [address] [phone]        Emily Saupe, MD Follow up on 10/23/2017.   Specialty:  Family Medicine Why:  at 11:30 am; please try to keep your apt or  call to reschedule Contact information: [address] [place] [address] [phone]        Heloise Purpura, MD Follow up.   Specialty:  Urology Why:  Please follow up with urology on 6/18 for your urinary retention.  The appointment will be at 8:45 AM (arrive at 8:30 to get checked  in).  Contact information: [address] [place] [address] [phone]        Rollene Rotunda, MD Follow up.   Specialty:  Cardiology Why:  Please call for an appointment Contact information: [address] [place] [address] [phone]            The results of significant diagnostics from this hospitalization (including imaging, microbiology, ancillary and laboratory) are listed below for reference.    Significant Diagnostic Studies: Dg Chest 2 View  Result Date: 10/16/2017 CLINICAL DATA:  Hypoxia; history coronary disease post MI and coronary stenting, CHF, hypertension, type II diabetes mellitus EXAM: CHEST - 2 VIEW COMPARISON:  10/12/2017 FINDINGS: Enlargement of cardiac silhouette. Mediastinal contours normal. Mild pulmonary vascular congestion. Interstitial infiltrates favoring pulmonary edema slightly improved at RIGHT base and slightly increased in LEFT perihilar region since previous exam. Bibasilar pleural effusions and atelectasis. Atherosclerotic calcification aortic arch. No pneumothorax. Bones demineralized. IMPRESSION: Persistent CHF with bibasilar effusions and atelectasis. Electronically Signed   By: Ulyses Southward M.D.   On: 10/16/2017 09:45   Dg Chest 2 View  Result Date: 10/12/2017 CLINICAL DATA:  Cough and shortness of breath EXAM: CHEST - 2 VIEW COMPARISON:  Oct 10, 2017 FINDINGS: Stable cardiomegaly. Improved pulmonary edema. Small effusions with underlying atelectasis. No other abnormalities. IMPRESSION: Cardiomegaly with small effusions and underlying atelectasis. Improved pulmonary edema. Electronically Signed   By: Gerome Sam III M.D   On:  10/12/2017 10:40   Dg Chest 2 View  Result Date: 10/10/2017 CLINICAL DATA:  Shortness of breath and weakness. EXAM: CHEST - 2 VIEW COMPARISON:  10/09/2017. FINDINGS: Mildly progressive enlargement of the cardiac silhouette. Mild increase in prominence of the pulmonary vasculature and interstitial markings. Small to moderate-sized bilateral pleural effusions without significant change. Mildly progressive left lower lobe airspace opacity. Mildly decreased right basilar airspace opacity. No acute bony abnormality. IMPRESSION: 1. Mildly progressive cardiomegaly and changes of congestive heart failure. 2. Mildly increased left lower lobe atelectasis or pneumonia and mildly improved right basilar atelectasis, pneumonia or alveolar edema. Electronically Signed   By: Beckie Salts M.D.   On: 10/10/2017 14:55   Dg Chest 2 View  Result Date: 10/09/2017 CLINICAL DATA:  Shortness of breath. EXAM: CHEST - 2 VIEW COMPARISON:  10/04/2017. FINDINGS: Heart size normal. Mild pulmonary vascular prominence and interstitial prominence with bilateral pleural effusions. Findings consistent with mild CHF. IMPRESSION: Congestive heart failure with bilateral interstitial edema and bilateral pleural effusions. Electronically Signed   By: Maisie Fus  Register   On: 10/09/2017 12:52   US Renal  Result Date: 10/10/2017 CLINICAL DATA:  Initial evaluation for acute renal injury. EXAM: RENAL / URINARY TRACT ULTRASOUND COMPLETE COMPARISON:  Prior CT from 12/02/2016. FINDINGS: Right Kidney: Length: 11.3 cm. Echogenicity within normal limits. Lobulated renal contour noted. No mass or hydronephrosis visualized. Trace free perinephric fluid. Left Kidney: Length: 9.4 cm. Echogenicity within normal limits. Approximate 5 mm nonobstructive stone within the lower pole. 1.8 x 1.5 x 1.1 cm cyst present within the interpolar region. Bladder: Appears normal for degree of bladder distention. IMPRESSION: 1. No hydronephrosis. 2. 5 mm nonobstructive left  renal nephrolithiasis. 3. 1.8 cm simple left renal cyst. Electronically Signed   By: Rise Mu M.D.   On: 10/10/2017 00:37   Nm Pulmonary Perf And Vent  Result Date: 10/09/2017 CLINICAL DATA:  Shortness of Breath EXAM: NUCLEAR MEDICINE VENTILATION - PERFUSION LUNG SCAN VIEWS: Anterior, posterior, left lateral, right lateral, RPO, LPO, RAO, LAO-ventilation and perfusion RADIOPHARMACEUTICALS:  31.5 mCi of Tc-27m DTPA aerosol inhalation and 4.15 mCi Tc34m-MAA IV COMPARISON:  Ventilation perfusion lung scan July 30, 2016. Chest radiograph Oct 09, 2017 FINDINGS: Ventilation: Radiotracer uptake bilaterally is homogeneous and symmetric. No ventilation defects are appreciable. Perfusion: Radiotracer uptake on the perfusion study is homogeneous and symmetric bilaterally. No perfusion defects are evident. No appreciable change from prior lung scan. IMPRESSION: No appreciable ventilation or perfusion defects. This study constitutes Melik Blancett very low probability of pulmonary embolus. Electronically Signed   By: Bretta Bang III M.D.   On: 10/09/2017 13:00   Dg Chest Portable 1 View  Result Date: 10/04/2017 CLINICAL DATA:  Short of breath EXAM: PORTABLE CHEST 1 VIEW COMPARISON:  03/01/2017 FINDINGS: Low lung volumes. Cardiomegaly with vascular congestion and mild diffuse interstitial edema. Small pleural effusions and bibasilar atelectasis. IMPRESSION: Low lung volumes. Cardiomegaly with vascular congestion, mild edema and small pleural effusions. Electronically Signed   By: Jasmine Pang M.D.   On: 10/04/2017 01:18    Microbiology: Recent Results (from the past 240 hour(s))  Culture, Urine     Status: Abnormal   Collection Time: 10/15/17  4:50 PM  Result Value Ref Range Status   Specimen Description URINE, RANDOM  Final   Special Requests   Final    NONE Performed at [facility], [address], [place], [place] [address]    Culture >=100,000 COLONIES/mL KLEBSIELLA PNEUMONIAE (Remberto Lienhard)  Final    Report Status 10/17/2017 FINAL  Final   Organism ID, Bacteria KLEBSIELLA PNEUMONIAE (Albirtha Grinage)  Final      Susceptibility   Klebsiella pneumoniae - MIC*    AMPICILLIN >=32 RESISTANT Resistant     CEFAZOLIN <=4 SENSITIVE Sensitive     CEFTRIAXONE <=1 SENSITIVE Sensitive     CIPROFLOXACIN <=0.25 SENSITIVE Sensitive     GENTAMICIN <=1 SENSITIVE Sensitive     IMIPENEM <=0.25 SENSITIVE Sensitive     NITROFURANTOIN 64 INTERMEDIATE Intermediate     TRIMETH/SULFA <=20 SENSITIVE Sensitive     AMPICILLIN/SULBACTAM >=32 RESISTANT Resistant     PIP/TAZO 8 SENSITIVE Sensitive     Extended ESBL NEGATIVE Sensitive     * >=100,000 COLONIES/mL KLEBSIELLA PNEUMONIAE     Labs: Basic Metabolic Panel: Recent Labs  Lab 10/14/17 0536 10/15/17 0532 10/16/17 0432 10/17/17 0557 10/18/17 0659  NA 139 140 139 140 140  K 4.0 4.4 4.5 4.5 4.5  CL 107 113* 112* 112* 111  CO2 24 23 23 22 23   GLUCOSE 202* 116* 126* 134* 128*  BUN 57* 57* 50* 46* 42*  CREATININE 2.55* 2.30* 1.95* 1.77* 1.68*  CALCIUM 8.3* 8.3* 8.4* 8.5* 8.5*  MG  --   --  2.1 2.0 2.0   Liver Function Tests: No results for input(s): AST, ALT, ALKPHOS, BILITOT, PROT, ALBUMIN in the last 168 hours. No results for input(s): LIPASE, AMYLASE in the last 168 hours. No results for input(s): AMMONIA in the last 168 hours. CBC: Recent Labs  Lab 10/16/17 0432 10/17/17 0557 10/18/17 0659  WBC 4.4 4.6 6.3  HGB 8.0* 8.6* 8.4*  HCT 27.2* 29.6* 29.1*  MCV 84.7 84.8 85.6  PLT 344 383 384   Cardiac Enzymes: No results for input(s): CKTOTAL, CKMB, CKMBINDEX, TROPONINI in the last 168 hours. BNP: BNP (last 3 results) Recent Labs    08/28/17 1916 10/04/17 0057 10/09/17 1240  BNP 2,542.7* 4,315.3* 4,427.6*    ProBNP (last 3 results) No results for input(s): PROBNP in the last 8760 hours.  CBG: Recent Labs  Lab 10/17/17  1645 10/17/17 2133 10/18/17 0336 10/18/17 0710 10/18/17 1200  GLUCAP 110* 119* 120* 124* 162*        Signed:  Lacretia Nicks MD.  [facility] 10/18/2017, 1:50 PM

## 2017-10-18 NOTE — Progress Notes (Signed)
Pt got discharged to home, discharge instructions provided and patient showed understanding to it, IV taken out,Telemonitor DC,pt left unit in wheelchair with all of the belongings accompanied with a family member (husband and Son)  Lonia Farber, Charity fundraiser

## 2017-10-18 NOTE — Care Management (Signed)
1337 10-18-17 CM did speak with patient and family. Plan will be for home with Home Health Services- [facility]. Liaison is aware that pt will need CSW added to Services. 02 in the room for home. Pt states she will not need Hospital Bed and 3n1.  No further needs from CM at this time. Gala Lewandowsky, RN,BSN Case Manager [phone].

## 2017-10-18 NOTE — Plan of Care (Signed)
  Problem: Clinical Measurements: Goal: Ability to maintain clinical measurements within normal limits will improve Outcome: Progressing   Problem: Clinical Measurements: Goal: Diagnostic test results will improve Outcome: Progressing   Problem: Activity: Goal: Risk for activity intolerance will decrease Outcome: Progressing   Problem: Skin Integrity: Goal: Risk for impaired skin integrity will decrease Outcome: Progressing

## 2017-10-20 ENCOUNTER — Other Ambulatory Visit: Payer: Self-pay | Admitting: *Deleted

## 2017-10-20 DIAGNOSIS — I5043 Acute on chronic combined systolic (congestive) and diastolic (congestive) heart failure: Secondary | ICD-10-CM

## 2017-10-20 NOTE — Consult Note (Signed)
[facility] Care Management follow up.  Chart reviewed. Noted [name] discharged home over the weekend with [facility] and outpatient palliative services with [facility].   Will make referral for [facility] RNCM follow up.  Raiford Noble, MSN-Ed, RN,BSN [facility] Liaison [phone]

## 2017-10-21 ENCOUNTER — Other Ambulatory Visit: Payer: Self-pay

## 2017-10-21 DIAGNOSIS — Z7982 Long term (current) use of aspirin: Secondary | ICD-10-CM | POA: Diagnosis not present

## 2017-10-21 DIAGNOSIS — R238 Other skin changes: Secondary | ICD-10-CM | POA: Diagnosis not present

## 2017-10-21 DIAGNOSIS — Z7902 Long term (current) use of antithrombotics/antiplatelets: Secondary | ICD-10-CM | POA: Diagnosis not present

## 2017-10-21 DIAGNOSIS — Z79891 Long term (current) use of opiate analgesic: Secondary | ICD-10-CM | POA: Diagnosis not present

## 2017-10-21 DIAGNOSIS — I5042 Chronic combined systolic (congestive) and diastolic (congestive) heart failure: Secondary | ICD-10-CM | POA: Diagnosis not present

## 2017-10-21 DIAGNOSIS — Z794 Long term (current) use of insulin: Secondary | ICD-10-CM | POA: Diagnosis not present

## 2017-10-21 DIAGNOSIS — N183 Chronic kidney disease, stage 3 (moderate): Secondary | ICD-10-CM | POA: Diagnosis not present

## 2017-10-21 DIAGNOSIS — E1122 Type 2 diabetes mellitus with diabetic chronic kidney disease: Secondary | ICD-10-CM | POA: Diagnosis not present

## 2017-10-21 DIAGNOSIS — I350 Nonrheumatic aortic (valve) stenosis: Secondary | ICD-10-CM | POA: Diagnosis not present

## 2017-10-21 DIAGNOSIS — E1151 Type 2 diabetes mellitus with diabetic peripheral angiopathy without gangrene: Secondary | ICD-10-CM | POA: Diagnosis not present

## 2017-10-21 DIAGNOSIS — E11610 Type 2 diabetes mellitus with diabetic neuropathic arthropathy: Secondary | ICD-10-CM | POA: Diagnosis not present

## 2017-10-21 NOTE — Patient Outreach (Signed)
Telephone assessment:  Primary MD office doing transition of care calls: Discharged from [facility] on 10/18/2017 for heart failure   Placed call to well known previous patient of mine. Patient reports that she is doing well. Reports no shortness of breath at this time. Reports not wearing oxygen. Reports taking all medications as prescribed. Reviewed pending follow up appointments and patient confirms her appointments and transportation. Reviewed [facility] program and patient is willing to participate. Reviewed with patient her discharge orders and follow up plans. Reviewed with patient the importance of following low salt diet and diabetic diet. Patient reports that she is not able to weigh due to inability to stand. Patient reports home health nurse from [facility] is coming out today.   Plan: offered home visit and patient has declined at this time. Would like a call back in 2 weeks. Will plan a call back in 2 weeks to schedule home visit.   No care plan at this time as patient did not identify any goals. Will reassess in 2 weeks.   Rowe Pavy, RN, BSN, CEN [facility] [employer] [phone]

## 2017-10-23 ENCOUNTER — Encounter: Payer: Self-pay | Admitting: Cardiology

## 2017-10-23 DIAGNOSIS — E785 Hyperlipidemia, unspecified: Secondary | ICD-10-CM | POA: Diagnosis not present

## 2017-10-23 DIAGNOSIS — Z79899 Other long term (current) drug therapy: Secondary | ICD-10-CM | POA: Diagnosis not present

## 2017-10-23 DIAGNOSIS — I10 Essential (primary) hypertension: Secondary | ICD-10-CM | POA: Diagnosis not present

## 2017-10-23 DIAGNOSIS — D638 Anemia in other chronic diseases classified elsewhere: Secondary | ICD-10-CM | POA: Diagnosis not present

## 2017-10-23 DIAGNOSIS — D510 Vitamin B12 deficiency anemia due to intrinsic factor deficiency: Secondary | ICD-10-CM | POA: Diagnosis not present

## 2017-10-23 DIAGNOSIS — Z6823 Body mass index (BMI) 23.0-23.9, adult: Secondary | ICD-10-CM | POA: Diagnosis not present

## 2017-10-23 DIAGNOSIS — I509 Heart failure, unspecified: Secondary | ICD-10-CM | POA: Diagnosis not present

## 2017-10-23 DIAGNOSIS — J984 Other disorders of lung: Secondary | ICD-10-CM | POA: Diagnosis not present

## 2017-10-24 DIAGNOSIS — Z7982 Long term (current) use of aspirin: Secondary | ICD-10-CM | POA: Diagnosis not present

## 2017-10-24 DIAGNOSIS — Z79891 Long term (current) use of opiate analgesic: Secondary | ICD-10-CM | POA: Diagnosis not present

## 2017-10-24 DIAGNOSIS — E11610 Type 2 diabetes mellitus with diabetic neuropathic arthropathy: Secondary | ICD-10-CM | POA: Diagnosis not present

## 2017-10-24 DIAGNOSIS — Z794 Long term (current) use of insulin: Secondary | ICD-10-CM | POA: Diagnosis not present

## 2017-10-24 DIAGNOSIS — R238 Other skin changes: Secondary | ICD-10-CM | POA: Diagnosis not present

## 2017-10-24 DIAGNOSIS — I5042 Chronic combined systolic (congestive) and diastolic (congestive) heart failure: Secondary | ICD-10-CM | POA: Diagnosis not present

## 2017-10-24 DIAGNOSIS — Z7902 Long term (current) use of antithrombotics/antiplatelets: Secondary | ICD-10-CM | POA: Diagnosis not present

## 2017-10-24 DIAGNOSIS — N183 Chronic kidney disease, stage 3 (moderate): Secondary | ICD-10-CM | POA: Diagnosis not present

## 2017-10-24 DIAGNOSIS — E1151 Type 2 diabetes mellitus with diabetic peripheral angiopathy without gangrene: Secondary | ICD-10-CM | POA: Diagnosis not present

## 2017-10-24 DIAGNOSIS — E1122 Type 2 diabetes mellitus with diabetic chronic kidney disease: Secondary | ICD-10-CM | POA: Diagnosis not present

## 2017-10-24 DIAGNOSIS — I350 Nonrheumatic aortic (valve) stenosis: Secondary | ICD-10-CM | POA: Diagnosis not present

## 2017-10-28 DIAGNOSIS — E1122 Type 2 diabetes mellitus with diabetic chronic kidney disease: Secondary | ICD-10-CM | POA: Diagnosis not present

## 2017-10-28 DIAGNOSIS — R338 Other retention of urine: Secondary | ICD-10-CM | POA: Diagnosis not present

## 2017-10-29 DIAGNOSIS — Z7982 Long term (current) use of aspirin: Secondary | ICD-10-CM | POA: Diagnosis not present

## 2017-10-29 DIAGNOSIS — I350 Nonrheumatic aortic (valve) stenosis: Secondary | ICD-10-CM | POA: Diagnosis not present

## 2017-10-29 DIAGNOSIS — Z794 Long term (current) use of insulin: Secondary | ICD-10-CM | POA: Diagnosis not present

## 2017-10-29 DIAGNOSIS — E1151 Type 2 diabetes mellitus with diabetic peripheral angiopathy without gangrene: Secondary | ICD-10-CM | POA: Diagnosis not present

## 2017-10-29 DIAGNOSIS — Z79891 Long term (current) use of opiate analgesic: Secondary | ICD-10-CM | POA: Diagnosis not present

## 2017-10-29 DIAGNOSIS — R238 Other skin changes: Secondary | ICD-10-CM | POA: Diagnosis not present

## 2017-10-29 DIAGNOSIS — I5042 Chronic combined systolic (congestive) and diastolic (congestive) heart failure: Secondary | ICD-10-CM | POA: Diagnosis not present

## 2017-10-29 DIAGNOSIS — Z7902 Long term (current) use of antithrombotics/antiplatelets: Secondary | ICD-10-CM | POA: Diagnosis not present

## 2017-10-29 DIAGNOSIS — N183 Chronic kidney disease, stage 3 (moderate): Secondary | ICD-10-CM | POA: Diagnosis not present

## 2017-10-29 DIAGNOSIS — E1122 Type 2 diabetes mellitus with diabetic chronic kidney disease: Secondary | ICD-10-CM | POA: Diagnosis not present

## 2017-10-29 DIAGNOSIS — E11610 Type 2 diabetes mellitus with diabetic neuropathic arthropathy: Secondary | ICD-10-CM | POA: Diagnosis not present

## 2017-10-30 ENCOUNTER — Ambulatory Visit (INDEPENDENT_AMBULATORY_CARE_PROVIDER_SITE_OTHER): Payer: Medicare Other | Admitting: Sports Medicine

## 2017-10-30 ENCOUNTER — Other Ambulatory Visit: Payer: Self-pay

## 2017-10-30 ENCOUNTER — Encounter: Payer: Self-pay | Admitting: Sports Medicine

## 2017-10-30 VITALS — BP 172/84 | HR 77 | Temp 97.7°F | Resp 16

## 2017-10-30 DIAGNOSIS — I83029 Varicose veins of left lower extremity with ulcer of unspecified site: Secondary | ICD-10-CM

## 2017-10-30 DIAGNOSIS — I739 Peripheral vascular disease, unspecified: Secondary | ICD-10-CM | POA: Diagnosis not present

## 2017-10-30 DIAGNOSIS — L97919 Non-pressure chronic ulcer of unspecified part of right lower leg with unspecified severity: Secondary | ICD-10-CM | POA: Diagnosis not present

## 2017-10-30 DIAGNOSIS — I83019 Varicose veins of right lower extremity with ulcer of unspecified site: Secondary | ICD-10-CM | POA: Diagnosis not present

## 2017-10-30 DIAGNOSIS — M14671 Charcot's joint, right ankle and foot: Secondary | ICD-10-CM | POA: Diagnosis not present

## 2017-10-30 DIAGNOSIS — L97929 Non-pressure chronic ulcer of unspecified part of left lower leg with unspecified severity: Secondary | ICD-10-CM | POA: Diagnosis not present

## 2017-10-30 DIAGNOSIS — E1142 Type 2 diabetes mellitus with diabetic polyneuropathy: Secondary | ICD-10-CM

## 2017-10-30 DIAGNOSIS — I890 Lymphedema, not elsewhere classified: Secondary | ICD-10-CM

## 2017-10-30 MED ORDER — CEPHALEXIN 500 MG PO CAPS: 500.0000 mg | ORAL_CAPSULE | Freq: Three times a day (TID) | ORAL | 1 refills | Status: DC

## 2017-10-30 NOTE — Progress Notes (Signed)
Subjective: SARAHMARIE LEAVEY is a 77 y.o. female patient seen today in office for wound evaluation and swelling in both legs. Reports that she goes to [facility] for wound care however the facility was closed today and the home nurse called to ask if we can see the patient since the wound center was not open.  Patient reports burning and stinging pain to legs constantly with constant oozing and draining from both legs of which she is going to [facility] for.  Patient admits that her medical issues precludes them from being able to do any amputation at this time especially since the right foot is still deformed and misshapen from the Charcot deformity.  Patient denies fever chills night sweats or vomiting admits that she does have difficulty breathing from the fluid that is accumulating all throughout her body states that she cannot lie flat has to sleep in a recliner. Patient is assisted by son. No other issues noted.   Patient Active Problem List   Diagnosis Date Noted  . Palliative care by specialist   . Goals of care, counseling/discussion   . Acute pulmonary edema (HCC)   . DCM (dilated cardiomyopathy) (HCC)   . Nonrheumatic aortic valve stenosis   . Pressure injury of skin 10/05/2017  . CHF (congestive heart failure) (HCC) 10/04/2017  . Acute respiratory failure with hypoxia (HCC) 10/04/2017  . Acute on chronic combined systolic and diastolic HF (heart failure) (HCC) 08/29/2017  . Pre-operative clearance 08/28/2017  . Acute on chronic combined systolic and diastolic CHF (congestive heart failure) (HCC) 08/28/2017  . Cellulitis of lower extremity 05/29/2017  . Acute kidney injury (HCC) 05/29/2017  . Acute renal failure (ARF) (HCC) 05/29/2017  . Stasis dermatitis of both legs 05/29/2017  . Edema 11/24/2016  . Chronic combined systolic and diastolic heart failure (HCC) 11/09/2016  . Unstable angina (HCC) 11/04/2016  . Charcot foot due to diabetes mellitus (HCC) 10/31/2016  . Chronic anemia  10/31/2016  . Mixed hyperlipidemia   . PVD (peripheral vascular disease) (HCC)   . CAD S/P percutaneous coronary angioplasty   . Non-ST elevation (NSTEMI) myocardial infarction (HCC) 10/06/2016  . NSTEMI (non-ST elevated myocardial infarction) (HCC) 10/06/2016  . CKD (chronic kidney disease), stage III (HCC) 08/27/2016  . Chronic diabetic ulcer of right foot  08/27/2016  . Loss of weight   . Acute combined systolic and diastolic congestive heart failure (HCC)   . Shortness of breath 07/29/2016  . Wound infection 11/13/2015  . Abdominal pain, RLQ (right lower quadrant) -chronic since 2009 06/02/2013  . Constipation, chronic 06/02/2013  . SUI (stress urinary incontinence, female) 06/02/2013  . Recurrent UTI (urinary tract infection) 06/02/2013  . Hypertensive cardiomyopathy, with heart failure (HCC)   . CHEST PAIN 11/27/2009  . OVERWEIGHT 11/28/2008  . DM type 2 causing CKD stage 3 (HCC) 11/25/2008  . Mild aortic stenosis 11/25/2008  . PANCREATITIS, HX OF 11/25/2008    Current Outpatient Medications on File Prior to Visit  Medication Sig Dispense Refill  . Ascorbic Acid (VITAMIN C) 1000 MG tablet Take 1,000 mg by mouth daily.    Marland Kitchen aspirin EC 81 MG tablet Take 1 tablet (81 mg total) by mouth daily. 150 tablet 2  . atorvastatin (LIPITOR) 40 MG tablet Take 1 tablet (40 mg total) by mouth daily at 6 PM. 30 tablet 3  . carvedilol (COREG) 25 MG tablet Take 1 tablet (25 mg total) by mouth 2 (two) times daily with a meal. 60 tablet 0  . clopidogrel (PLAVIX) 75  MG tablet Take 1 tablet (75 mg total) by mouth daily with breakfast. 30 tablet 6  . collagenase (SANTYL) ointment Apply topically daily. Apply to LE plantar surface daily. 15 g 0  . cyclobenzaprine (FLEXERIL) 10 MG tablet Take 10 mg by mouth 3 (three) times daily.   0  . diclofenac sodium (VOLTAREN) 1 % GEL Apply 2 g topically 4 (four) times daily. Please apply to let hip. 1 Tube 0  . fexofenadine (ALLEGRA) 180 MG tablet Take 180 mg by  mouth daily.    . fluticasone (FLONASE) 50 MCG/ACT nasal spray Place 1 spray into both nostrils daily as needed (seasonal allergies).     . furosemide (LASIX) 40 MG tablet Take 1 tablet (40 mg total) by mouth daily. 30 tablet 5  . glimepiride (AMARYL) 4 MG tablet Take 2-4 mg by mouth See admin instructions. Take 1 tablet by mouth every morning, and 1/2 tablet every evening  0  . HUMALOG KWIKPEN 100 UNIT/ML KiwkPen Inject 2 Units into the skin daily with supper.  3  . hydrALAZINE (APRESOLINE) 25 MG tablet Take 25 mg by mouth every 8 (eight) hours.    Marland Kitchen HYDROcodone-acetaminophen (NORCO/VICODIN) 5-325 MG tablet Take 1 tablet by mouth every 6 (six) hours as needed for moderate pain. 10 tablet 0  . insulin glargine (LANTUS) 100 UNIT/ML injection Inject 0.12 mLs (12 Units total) into the skin at bedtime. (Patient taking differently: Inject 40 Units into the skin at bedtime. ) 10 mL 11  . isosorbide mononitrate (IMDUR) 30 MG 24 hr tablet Take 1 tablet (30 mg total) by mouth daily. 30 tablet 6  . linagliptin (TRADJENTA) 5 MG TABS tablet Take 5 mg by mouth daily.    . mupirocin cream (BACTROBAN) 2 % Apply 1 application topically 2 (two) times daily. 15 g 0  . nitroGLYCERIN (NITROSTAT) 0.4 MG SL tablet Place 1 tablet (0.4 mg total) under the tongue every 5 (five) minutes x 3 doses as needed for chest pain. 25 tablet 3  . pantoprazole (PROTONIX) 40 MG tablet Take 1 tablet (40 mg total) by mouth daily. 90 tablet 1  . Polyethyl Glycol-Propyl Glycol (SYSTANE OP) Place 1 drop into both eyes daily as needed (dry eyes).    . polyethylene glycol (MIRALAX / GLYCOLAX) packet Take 17 g by mouth daily as needed (constipation). Mix in 8 oz liquid and drink    . silver sulfADIAZINE (SILVADENE) 1 % cream Apply topically daily. Apply to right lower extremities affected areas, not to the plantar region 50 g 0  . tamsulosin (FLOMAX) 0.4 MG CAPS capsule Take 1 capsule (0.4 mg total) by mouth daily after supper. 30 capsule 0    No current facility-administered medications on file prior to visit.     Allergies  Allergen Reactions  . Levofloxacin Nausea And Vomiting and Other (See Comments)    Confusion  . Amlodipine Other (See Comments)    Localized edema  . Gabapentin Other (See Comments)    Disoriented, no strength in legs  . Hydrochlorothiazide Other (See Comments)    Hot and disoriented  . Ibuprofen Other (See Comments)    Affected kidneys - stopped flow of urine Reaction to Advil  . Metformin And Related Other (See Comments)    Affected kidneys - stopped flow of urine  . Simvastatin Other (See Comments)    Chest pain    Objective: There were no vitals filed for this visit.  General: No acute distress, AAOx3  Focused lower extremity physical exam  Chronic venous stasis with hyperpigmentation to both lower extremities that is red and oozing clear drainage with multiple areas of trophic skin and skin that has sloughed off due to the excessive drainage bilateral.  There is no foot wounds however there is significant right foot deformity with complete lateral dislocation of the foot with prominent talar head medially due to severe Charcot deformity.  Pedal pulses are unobtainable due to chronic edema in legs however capillary fill time is present to toes.  Patient has impaired sensation bilateral lower extremities secondary to neuropathy.  Assessment and Plan:  Problem List Items Addressed This Visit    None    Visit Diagnoses    Venous stasis ulcers of both lower extremities (HCC)    -  Primary   Relevant Medications   cephALEXin (KEFLEX) 500 MG capsule   Lymphedema       Relevant Medications   cephALEXin (KEFLEX) 500 MG capsule   Peripheral vascular disease, unspecified (HCC)       Diabetic polyneuropathy associated with type 2 diabetes mellitus (HCC)       Charcot's joint of ankle, right          -Patient seen and evaluated -Applied Silvadene cream and petroleum gauze to both lower  extremities and covered with dry light compressive dressing with Ace wrap  -Prescribed Keflex for preventative measure -Patient to continue with follow-up at [facility] wound care -Continue with medication management by PCP and cardiology and nephrology -Continue with transport wheelchair and no to limited weightbearing for transfers only -Return to office as needed  Asencion Islam, DPM

## 2017-10-30 NOTE — Patient Outreach (Addendum)
Transition of care call:  Placed call to patient who was not home. Husband reports patient is at " foot doctor"   Husband reports patient had follow up with primary MD and nephrologist this week. Reports that patient remains swollen and unable to weigh.   Reviewed with husband I would like to schedule a home visit and husband reports to call back next week.  PLAN: will call patient back in 1 week to schedule home visit. No care plan as I was unable to speak with patient.  Rowe Pavy, RN, BSN, CEN [facility] [employer] [phone]

## 2017-10-31 ENCOUNTER — Other Ambulatory Visit: Payer: Self-pay | Admitting: *Deleted

## 2017-10-31 DIAGNOSIS — Z794 Long term (current) use of insulin: Secondary | ICD-10-CM | POA: Diagnosis not present

## 2017-10-31 DIAGNOSIS — E1122 Type 2 diabetes mellitus with diabetic chronic kidney disease: Secondary | ICD-10-CM | POA: Diagnosis not present

## 2017-10-31 DIAGNOSIS — I350 Nonrheumatic aortic (valve) stenosis: Secondary | ICD-10-CM | POA: Diagnosis not present

## 2017-10-31 DIAGNOSIS — Z7982 Long term (current) use of aspirin: Secondary | ICD-10-CM | POA: Diagnosis not present

## 2017-10-31 DIAGNOSIS — Z7902 Long term (current) use of antithrombotics/antiplatelets: Secondary | ICD-10-CM | POA: Diagnosis not present

## 2017-10-31 DIAGNOSIS — E1151 Type 2 diabetes mellitus with diabetic peripheral angiopathy without gangrene: Secondary | ICD-10-CM | POA: Diagnosis not present

## 2017-10-31 DIAGNOSIS — Z79891 Long term (current) use of opiate analgesic: Secondary | ICD-10-CM | POA: Diagnosis not present

## 2017-10-31 DIAGNOSIS — N183 Chronic kidney disease, stage 3 (moderate): Secondary | ICD-10-CM | POA: Diagnosis not present

## 2017-10-31 DIAGNOSIS — I5042 Chronic combined systolic (congestive) and diastolic (congestive) heart failure: Secondary | ICD-10-CM | POA: Diagnosis not present

## 2017-10-31 DIAGNOSIS — E11610 Type 2 diabetes mellitus with diabetic neuropathic arthropathy: Secondary | ICD-10-CM | POA: Diagnosis not present

## 2017-10-31 DIAGNOSIS — R238 Other skin changes: Secondary | ICD-10-CM | POA: Diagnosis not present

## 2017-10-31 NOTE — Patient Outreach (Addendum)
[facility] Customer service manager [facility]) Care Management  10/31/2017  Emily Miranda 09/06/1940 [id number]   EMMI red alert flag 6/20.  Questions patient answered yes New problems - yes Worsening swelling -yes  No exactly what meds to take- no   Received Red alert message from yesterday noted Rowe Pavy made contact with patient husband on 6/20,  outreach call to patient to follow up , HIPAA verified,explained reason for the call . Patient discussed swelling that she has in her legs, redness and blisters, she attempted to follow up at [facility] wound center  on yesterday where she usually goes  but no appointments , so she states home health RN arranged for her to [facility] and silvadene dressing was applied and she was started on an antibiotic.  Patient states her husband and son supportive helps as needed with medication, transportation and care needs,  states she is anticipating home health nurse visit on today and needs to go.    Plan  Will update Rowe Pavy assigned care manager of phone call.  Egbert Garibaldi, RN, [facility] Care Management,Care Management Coordinator  [phone]- Mobile [phone]- Toll Free Main Office

## 2017-11-03 ENCOUNTER — Telehealth: Payer: Self-pay | Admitting: Cardiology

## 2017-11-03 NOTE — Telephone Encounter (Signed)
see phone note from Deliah Goody, RN.Per Tereso Newcomer, PA I called the pt's daughter to go over recommendations per Tereso Newcomer, PA that pt will need an appt with Dr. Antoine Poche or APP on Care team in the next 1-2 weeks for surgery clearance. Daughter states to me that they s/w and said the pt was given clearance. I advised Tereso Newcomer, PA of this. I do not see where in the chart this has happened. I tried to reach out to Dr. Brayton Caves office to see if we could find out who sent the clearance because t does not look like clearance came from Cardiology at this time. I was unable to reach anyone in the Dr. Kathleen Lime office.

## 2017-11-03 NOTE — Telephone Encounter (Signed)
   Primary Cardiologist:James Hochrein, MD  Preop Callback staff: 1. Patient cannot be cleared for surgery and surgery should be postponed. 2. Please arrange follow up with Dr. Antoine Poche or APP on care team in next 1-2 weeks. 3. Please route phone note to provider that will see the patient as this provider will provide surgical risk recommendations to the requesting surgeon. 4. Note will be removed from preop pool.  Extensive, complex hx reviewed: CAD - multiple PCI procedures; most recent DES to LAD and LCx in 07/2017 at [facility] Mild to mod AS (mean gradient 13 by echo in 3/18) IDDM with Charcot Foot PAD - needs AKA at [facility] soon Chronic combined systolic and diastolic CHF EF 42-68 by echo in 2018 Admitted 08/2017 with decompensated CHF CKD Pulmo HTN HTN  Admitted to [facility] 5/25-6/8 with decompensated CHF.   R/L heart cath with patent stents in LAD, LCx, RCA, RPDA.  Residual prox LAD 65%, D1 70%, mid LCx 60%.  Mod pulmo HTN with PAP 35. Echo with worsening AS (EF 45-50; mean gradient 26, PASP 60) PFTs suggested restrictive lung disease. Dr. Mayford Knife recommended no surgery given current clinical status and recent PCI in 07/2017.  The patient should be seen in follow up before any recommendations can be made regarding surgical risk.  An appointment will be made.  Tereso Newcomer, PA-C  11/03/2017, 3:10 PM

## 2017-11-03 NOTE — Telephone Encounter (Addendum)
Spoke with pt dtr, she is concerned because the patient is scheduled for surgery and while the patient was in the hospital they were told she is not a candidate for surgery. dtr made aware we got the surgical clearance this morning and the information will be sent to the surgical clearance pool to be reviewed and then the decision will be sent to the requesting provider. Please call daughter with the recommendations.

## 2017-11-03 NOTE — Telephone Encounter (Signed)
New Message:       Pt's daughter is calling with questions about her mother's upcoming amputation at [facility]. She states that her mother is not up to this surgery and we should not have given the go ahead to them for this procedure

## 2017-11-03 NOTE — Telephone Encounter (Signed)
New Message:      Request for surgical clearance:  1. What type of surgery is being performed?Above the knee Amputation   2. When is this surgery scheduled? 11/06/2017   3. What type of clearance is required (medical clearance vs. Pharmacy clearance to hold med vs. Both)?General Cardiac Clearance 4. Are there any medications that need to be held prior to surgery and how long?Pt is on Plavix,but the doctor will not stop itNo   5. Practice name and name of physician performing surgery? Dr Clide Dales   6. What is your office phone [phone]    7.   What is your office fax [phone]  8.   Anesthesia type (None, local, MAC, general) ? General

## 2017-11-04 ENCOUNTER — Telehealth: Payer: Self-pay | Admitting: Cardiology

## 2017-11-04 ENCOUNTER — Other Ambulatory Visit: Payer: Self-pay

## 2017-11-04 DIAGNOSIS — E1151 Type 2 diabetes mellitus with diabetic peripheral angiopathy without gangrene: Secondary | ICD-10-CM | POA: Diagnosis not present

## 2017-11-04 DIAGNOSIS — N183 Chronic kidney disease, stage 3 (moderate): Secondary | ICD-10-CM | POA: Diagnosis not present

## 2017-11-04 DIAGNOSIS — Z7982 Long term (current) use of aspirin: Secondary | ICD-10-CM | POA: Diagnosis not present

## 2017-11-04 DIAGNOSIS — E1122 Type 2 diabetes mellitus with diabetic chronic kidney disease: Secondary | ICD-10-CM | POA: Diagnosis not present

## 2017-11-04 DIAGNOSIS — R238 Other skin changes: Secondary | ICD-10-CM | POA: Diagnosis not present

## 2017-11-04 DIAGNOSIS — E11610 Type 2 diabetes mellitus with diabetic neuropathic arthropathy: Secondary | ICD-10-CM | POA: Diagnosis not present

## 2017-11-04 DIAGNOSIS — Z79891 Long term (current) use of opiate analgesic: Secondary | ICD-10-CM | POA: Diagnosis not present

## 2017-11-04 DIAGNOSIS — I5042 Chronic combined systolic (congestive) and diastolic (congestive) heart failure: Secondary | ICD-10-CM | POA: Diagnosis not present

## 2017-11-04 DIAGNOSIS — I350 Nonrheumatic aortic (valve) stenosis: Secondary | ICD-10-CM | POA: Diagnosis not present

## 2017-11-04 DIAGNOSIS — Z794 Long term (current) use of insulin: Secondary | ICD-10-CM | POA: Diagnosis not present

## 2017-11-04 DIAGNOSIS — Z7902 Long term (current) use of antithrombotics/antiplatelets: Secondary | ICD-10-CM | POA: Diagnosis not present

## 2017-11-04 NOTE — Patient Outreach (Signed)
Telephone assessment:  Placed call to patient to follow up on scheduling a home visit.  Patient reports she is about the same. Declines home visit this week. Offered home visit for next week and patient has accepted for July 2. Confirmed address.  PLAN: home visit for assessment of needs on July 2.  Rowe Pavy, RN, BSN, CEN [facility] [employer] [phone]

## 2017-11-04 NOTE — Telephone Encounter (Signed)
New Message:     Pt's daughter is calling reference to a pre-op paper from [facility] for the pt.

## 2017-11-04 NOTE — Telephone Encounter (Signed)
Spoke with Zella Ball, pt's daughter. She was so confused about the phone note that was documented in pts chart re; surgical clearance. She has been made aware that the pt has not been cleared for surgery and she has a appt to see Theodore Demark, PA-C, 11/10/17, for that particular reason. Pt verbalized understanding and stated that she will call the surgeons office and make them aware as well. I have place a call to Dr. Kathleen Lime office as well, spoke with Winona Legato.  They have been made aware that pt has not been cleared and her surgery scheduled for 11/06/17, will need to be cancelled until she can be cleared and that she has an appt 11/10/17 for surgical clearance.

## 2017-11-06 ENCOUNTER — Ambulatory Visit: Payer: Medicare Other | Admitting: Sports Medicine

## 2017-11-07 ENCOUNTER — Other Ambulatory Visit: Payer: Self-pay

## 2017-11-07 NOTE — Patient Outreach (Signed)
Telephone call: Incoming call and voicemail from patient requesting to cancel her home visit for July 2.    Placed call back to patient and reviewed [facility] program. Patient reports that she has 2 other agencies coming in including home health and she is no longer interested in [facility].  Patient has a agreed to letter and she will contact me if she needs assistance.   PLAN: close case has withdrawn. Will mail letter to patient and notify MD.  Rowe Pavy, RN, BSN, CEN [facility] Coordinator [phone]

## 2017-11-10 ENCOUNTER — Encounter: Payer: Self-pay | Admitting: Physician Assistant

## 2017-11-10 ENCOUNTER — Ambulatory Visit (INDEPENDENT_AMBULATORY_CARE_PROVIDER_SITE_OTHER): Payer: Medicare Other | Admitting: Physician Assistant

## 2017-11-10 VITALS — BP 145/71 | HR 72 | Ht 66.0 in | Wt 134.6 lb

## 2017-11-10 DIAGNOSIS — Z01810 Encounter for preprocedural cardiovascular examination: Secondary | ICD-10-CM

## 2017-11-10 DIAGNOSIS — I255 Ischemic cardiomyopathy: Secondary | ICD-10-CM | POA: Diagnosis not present

## 2017-11-10 DIAGNOSIS — I5042 Chronic combined systolic (congestive) and diastolic (congestive) heart failure: Secondary | ICD-10-CM

## 2017-11-10 DIAGNOSIS — I214 Non-ST elevation (NSTEMI) myocardial infarction: Secondary | ICD-10-CM | POA: Diagnosis not present

## 2017-11-10 DIAGNOSIS — N183 Chronic kidney disease, stage 3 unspecified: Secondary | ICD-10-CM

## 2017-11-10 DIAGNOSIS — Z79899 Other long term (current) drug therapy: Secondary | ICD-10-CM | POA: Diagnosis not present

## 2017-11-10 DIAGNOSIS — E11610 Type 2 diabetes mellitus with diabetic neuropathic arthropathy: Secondary | ICD-10-CM | POA: Diagnosis not present

## 2017-11-10 DIAGNOSIS — I10 Essential (primary) hypertension: Secondary | ICD-10-CM

## 2017-11-10 NOTE — Progress Notes (Signed)
Cardiology Office Note   Date:  11/10/2017   ID:  Emily, Miranda [birth year], MRN [id number]  PCP:  Noni Saupe, MD  Cardiologist:  Rollene Rotunda, MD Theodore Demark, PA-C    History of Present Illness: Emily Miranda is a 77 y.o. female with a history of multiple PCIs, last 07/2017 w/ DES LAD & CFX @ [facility], cath  mild-mod AS, S-D-CHF w/ EF 40-45% 2018, CKD III, DM, HTN, HLD, pulm HTN, R foot Charcot's deformity, PAD, anemia  Admitted 5/25-10/18/2017 for CHF exacerbation, AKI on CKD, initially required BiPAP, Klebsiella UTI, discharged on home O2, DC weight 150 pounds, peak creatinine 2.57, 1.68 at discharge, patient currently refusing dialysis, patient required Foley for urinary retention, TSH abnormal, recheck in 4-6 weeks, patient signed a DNR form, significant deconditioning 6/24 phone notes regarding cardiology clearance for R AKA, appointment made  Emily Miranda presents for cardiology follow up and preoperative evaluation.  She is here with her daughter and her son.  She can tell her fluid status is improved. However, she still feels that she has some fluid overload.  She is not able to weigh yourself at home.  She was able to weigh herself here and is lost 15 pounds.  She is aware that her abdomen is smaller than it used to be.  Her legs are at their baseline.  She has problems breathing first thing in the morning. It gets better throughout the day.   BP range 119-140 first thing in the morning.   She follows her blood sugar regularly.  She recently had an evening reading in the 60s, the next morning her blood sugar was over 200.  She eats 2 meals a day.  Much of the food is restaurant food, from locally under restaurants.  She eats mostly vegetables, not much protein.  She has been compliant w/ her medications.  She has not had chest pain.    She constantly struggles trying to do things because she is not able to walk because her legs and she also has  problems with dyspnea with any exertion.  However, there has been no recent change.  She reiterates her desire not to have dialysis.  She knows she needs an amputation on her right leg.  She is very worried about the procedure because she understands the risks however, she is also extremely worried about the possibility of the infection worsening and killing her.  Therefore, she feels that it is time for her to go ahead and have the procedure.  She had labs checked at her PCP recently, available in [facility]   Past Medical History:  Diagnosis Date  . AKI (acute kidney injury) (HCC)   . Aortic stenosis, mild   . CAD (coronary artery disease)    a. 09/2016 NSTEMI/Cath: RCA 70p/m, RPDA 95ost-->Med Rx;  b. 10/2016 PCI: RCA 70p/m (2.25 x 15 Resolute Onyx DES), RPDA 95ost (2.0 x 12 Resolute Onyx DES).  . Charcot foot due to diabetes mellitus (HCC)   . Chronic combined systolic (congestive) and diastolic (congestive) heart failure (HCC)    a. 07/2016 Echo: EF 40-45%, Gr2 DD, mild AS, triv AI, mild MR, mod dil LA.  . Diabetic peripheral neuropathy (HCC)   . Heart murmur   . History of blood transfusion    "related to low HgB"  . History of kidney stones   . History of stomach ulcers   . HTN (hypertension)   . Hyperlipidemia   . Migraine    "  maybe once/month" (08/28/2017)  . Myocardial infarction (HCC) 07/2017   while at [facility], s/p cath with DES LAD and CFX  . NSTEMI (non-ST elevated myocardial infarction) (HCC) 10/06/2016  . Pancreatitis    "my dad died w/it at 30 yr old"  . Physical deconditioning    a. W/C bound.  . Pneumonia 10/2016  . Stasis dermatitis of both legs 05/2017  . Type II diabetes mellitus (HCC)     Past Surgical History:  Procedure Laterality Date  . ABDOMINAL HYSTERECTOMY    . APPENDECTOMY    . CARPAL TUNNEL RELEASE Bilateral   . CATARACT EXTRACTION W/ INTRAOCULAR LENS  IMPLANT, BILATERAL Bilateral   . CORONARY ANGIOPLASTY WITH STENT PLACEMENT  07/2017   "@ [facility]"  .  CORONARY STENT INTERVENTION N/A 11/05/2016   Procedure: Coronary Stent Intervention;  Surgeon: Lennette Bihari, MD;  Location: [facility] INVASIVE CV LAB;  Service: Cardiovascular;  Laterality: N/A;  . DILATION AND CURETTAGE OF UTERUS    . EXCISIONAL HEMORRHOIDECTOMY    . FOOT SURGERY Right    "ulcer"  . GLAUCOMA SURGERY Bilateral   . LAPAROSCOPIC INCISIONAL / UMBILICAL / VENTRAL HERNIA REPAIR  01/04/2008   Dr Bertram Savin  . LAPAROSCOPIC LYSIS OF ADHESIONS  2007   Dr Donovan Kail  . LEFT HEART CATH AND CORONARY ANGIOGRAPHY N/A 10/11/2016   Procedure: Left Heart Cath and Coronary Angiography;  Surgeon: Yvonne Kendall, MD;  Location: [facility] INVASIVE CV LAB;  Service: Cardiovascular;  Laterality: N/A;  . OVARIAN CYST REMOVAL    . RIGHT/LEFT HEART CATH AND CORONARY ANGIOGRAPHY N/A 10/09/2017   Procedure: RIGHT/LEFT HEART CATH AND CORONARY ANGIOGRAPHY;  Surgeon: Lyn Records, MD;  Location: [facility] INVASIVE CV LAB;  Service: Cardiovascular;  Laterality: N/A;  . ROBOT ASSISTED PYELOPLASTY  02/2007   Dr Laverle Patter  . SUTURE REMOVAL  08/17/2009   Dr Bertram Savin .  Right paramedian GoreTex stitch  . TONSILLECTOMY      Current Outpatient Medications  Medication Sig Dispense Refill  . Ascorbic Acid (VITAMIN C) 1000 MG tablet Take 1,000 mg by mouth daily.    Marland Kitchen aspirin EC 81 MG tablet Take 1 tablet (81 mg total) by mouth daily. 150 tablet 2  . atorvastatin (LIPITOR) 40 MG tablet Take 1 tablet (40 mg total) by mouth daily at 6 PM. 30 tablet 3  . carvedilol (COREG) 25 MG tablet Take 1 tablet (25 mg total) by mouth 2 (two) times daily with a meal. 60 tablet 0  . cephALEXin (KEFLEX) 500 MG capsule Take 1 capsule (500 mg total) by mouth 3 (three) times daily. 30 capsule 1  . clopidogrel (PLAVIX) 75 MG tablet Take 1 tablet (75 mg total) by mouth daily with breakfast. 30 tablet 6  . collagenase (SANTYL) ointment Apply topically daily. Apply to LE plantar surface daily. 15 g 0  . cyclobenzaprine (FLEXERIL) 10 MG tablet Take 10 mg  by mouth 3 (three) times daily.   0  . diclofenac sodium (VOLTAREN) 1 % GEL Apply 2 g topically 4 (four) times daily. Please apply to let hip. 1 Tube 0  . fexofenadine (ALLEGRA) 180 MG tablet Take 180 mg by mouth daily.    . fluticasone (FLONASE) 50 MCG/ACT nasal spray Place 1 spray into both nostrils daily as needed (seasonal allergies).     . furosemide (LASIX) 40 MG tablet Take 1 tablet (40 mg total) by mouth daily. 30 tablet 5  . glimepiride (AMARYL) 4 MG tablet Take 2-4 mg by mouth See admin  instructions. Take 1 tablet by mouth every morning, and 1/2 tablet every evening  0  . HUMALOG KWIKPEN 100 UNIT/ML KiwkPen Inject 2 Units into the skin daily with supper.  3  . hydrALAZINE (APRESOLINE) 25 MG tablet Take 25 mg by mouth every 8 (eight) hours.    Marland Kitchen HYDROcodone-acetaminophen (NORCO/VICODIN) 5-325 MG tablet Take 1 tablet by mouth every 6 (six) hours as needed for moderate pain. 10 tablet 0  . insulin glargine (LANTUS) 100 UNIT/ML injection Inject 0.12 mLs (12 Units total) into the skin at bedtime. (Patient taking differently: Inject 40 Units into the skin at bedtime. ) 10 mL 11  . isosorbide mononitrate (IMDUR) 30 MG 24 hr tablet Take 1 tablet (30 mg total) by mouth daily. 30 tablet 6  . linagliptin (TRADJENTA) 5 MG TABS tablet Take 5 mg by mouth daily.    . mupirocin cream (BACTROBAN) 2 % Apply 1 application topically 2 (two) times daily. 15 g 0  . nitroGLYCERIN (NITROSTAT) 0.4 MG SL tablet Place 1 tablet (0.4 mg total) under the tongue every 5 (five) minutes x 3 doses as needed for chest pain. 25 tablet 3  . pantoprazole (PROTONIX) 40 MG tablet Take 1 tablet (40 mg total) by mouth daily. 90 tablet 1  . Polyethyl Glycol-Propyl Glycol (SYSTANE OP) Place 1 drop into both eyes daily as needed (dry eyes).    . polyethylene glycol (MIRALAX / GLYCOLAX) packet Take 17 g by mouth daily as needed (constipation). Mix in 8 oz liquid and drink    . silver sulfADIAZINE (SILVADENE) 1 % cream Apply topically  daily. Apply to right lower extremities affected areas, not to the plantar region 50 g 0  . tamsulosin (FLOMAX) 0.4 MG CAPS capsule Take 1 capsule (0.4 mg total) by mouth daily after supper. 30 capsule 0   No current facility-administered medications for this visit.     Allergies:   Levofloxacin; Amlodipine; Gabapentin; Hydrochlorothiazide; Ibuprofen; Metformin and related; and Simvastatin    Social History:  The patient  reports that she has never smoked. She has never used smokeless tobacco. She reports that she does not drink alcohol or use drugs.   Family History:  The patient's family history includes Chronic Renal Failure in her mother; Diabetes in her father; Heart attack (age of onset: 93) in her father.    ROS:  Please see the history of present illness. All other systems are reviewed and negative.    PHYSICAL EXAM: VS:  BP (!) 145/71   Pulse 72   Ht 5\' 6"  (1.676 m)   Wt 134 lb 9.6 oz (61.1 kg)   BMI 21.73 kg/m  , BMI Body mass index is 21.73 kg/m. GEN: Well nourished, chronically ill appearing female, in no acute distress  HEENT: normal for age  Neck: Minimal JVD, no carotid bruit, no masses Cardiac: RRR; 2/6 murmur, no rubs, or gallops Respiratory: Decreased breath sounds bases bilaterally, normal work of breathing GI: soft, nontender, nondistended, + BS MS: no deformity or atrophy; chronic lymphedema; both legs are wrapped because of cellulitis, erythema seen, distal pulses are 2+ in upper extremities, not palpable in lower extremities due to chronic skin changes Skin: warm and dry, no rash, chronic lower extremity skin thickening Neuro:  Strength and sensation are intact Psych: euthymic mood, full affect   EKG:  EKG is ordered today. The ekg ordered today demonstrates sinus rhythm, first-degree AV block with PR interval 234 ms, no significant morphology change from 10/04/2017  ECHO: 10/07/2017 - Left  ventricle: The cavity size was normal. There was mild    concentric hypertrophy. Systolic function was mildly reduced. The   estimated ejection fraction was in the range of 45% to 50%.   Hypokinesis of the basal-midinferoseptal myocardium. Doppler   parameters are consistent with abnormal left ventricular   relaxation (grade 1 diastolic dysfunction). Doppler parameters   are consistent with high ventricular filling pressure. - Aortic valve: There was moderate stenosis. There was moderate   regurgitation. Peak velocity (S): 365 cm/s. Mean gradient (S): 26   mm Hg. Valve area (VTI): 0.85 cm^2. Valve area (Vmax): 0.84 cm^2.   Valve area (Vmean): 0.81 cm^2. Regurgitation pressure half-time:   379 ms. - Mitral valve: Transvalvular velocity was within the normal range.   There was no evidence for stenosis. There was mild regurgitation. - Left atrium: The atrium was mildly dilated. - Right ventricle: The cavity size was normal. Wall thickness was   normal. Systolic function was normal. - Right atrium: The atrium was severely dilated. - Tricuspid valve: There was mild regurgitation. - Pulmonary arteries: Systolic pressure was severely increased. PA   peak pressure: 60 mm Hg (S).  Cardiac CATH: 10/09/2017  Moderate pulmonary hypertension -mean PA pressure 35 mmHg.  Diffuse three-vessel coronary disease with widely patent stents.  Left main is widely patent  Mid LAD contains a long region of stent which is widely patent.  Proximal and distal to the stent there is diffuse coronary disease with up to 70% narrowing.  This vessel is significantly improved since the pre-PCI comparison from June 2018.  Widely patent stent in the proximal to mid circumflex.  Moderate diffuse disease is noted beyond the stent.  Widely patent stent in the mid RCA and in the proximal portion of the PDA.  Diffuse disease in the distal RCA improved compared to prior angiogram June 2018.  Normal left ventricular end-diastolic pressure.  Mild to moderate gradient across the  aortic valve with calculated aortic valve area 1.48 cm  RECOMMENDATIONS:   Further management per treating team. Diagnostic Diagram        Recent Labs: 10/04/2017: ALT 22; TSH 8.872 10/09/2017: B Natriuretic Peptide 4,427.6 10/18/2017: BUN 42; Creatinine, Ser 1.68; Hemoglobin 8.4; Magnesium 2.0; Platelets 384; Potassium 4.5; Sodium 140    Lipid Panel    Component Value Date/Time   CHOL 125 10/07/2016 0233   TRIG 97 10/07/2016 0233   HDL 34 (L) 10/07/2016 0233   CHOLHDL 3.7 10/07/2016 0233   VLDL 19 10/07/2016 0233   LDLCALC 72 10/07/2016 0233     Wt Readings from Last 3 Encounters:  11/10/17 134 lb 9.6 oz (61.1 kg)  10/18/17 156 lb (70.8 kg)  09/04/17 152 lb 11.2 oz (69.3 kg)     Other studies Reviewed: Additional studies/ records that were reviewed today include: Office notes, hospital records and testing.  ASSESSMENT AND PLAN:  1.  Preoperative evaluation: -Currently, her respiratory status is at baseline. -I reviewed her RCRI with the patient and her daughter.  - She has an 11% chance is of major cardiac event in the perioperative period.  - I explained that there is nothing we can do to change that.  Her recent cardiac catheterization and echo were reviewed. -Her EF is mildly abnormal with grade 1 diastolic dysfunction. - Her volume status is improved with medication compliance and improved dietary compliance - She had no critical CAD and patent stents at her cardiac catheterization, medical therapy is the best option. -Her exercise tolerance is poor,  but this cannot be improved at this time. - Her age and general frailty also play a role here. - [name] and her family are aware that she is at high risk for the planned procedure. -However, because of the risks having recurrent infections in her leg, it may be worth the risk. -She is to discuss this with her family and with the surgeon.  Her daughter and son in the room understand.  2.  Chronic combined  systolic and diastolic CHF: -I feel her volume status is at baseline. -No med changes right now, BMET by her PCP showed a creatinine of 1.89, on the high side but in line with previous values, recheck ordered. - I explained that is a balancing act between her volume status and her creatinine. -I could decrease the Lasix intermittently, but her volume status might get worse and it is important to keep her on the dry side so that her respiratory status is maximized.  3.  NSTEMI/ CAD: - Compliance with her medications is encouraged.  She is on baby aspirin, Coreg 25 mg twice daily and high-dose Lipitor.  She is also on Plavix and Imdur 30 mg. -She is not having any ischemic symptoms. -Continue current medications.  4.  Ischemic cardiomyopathy: - In addition to the medications above, she is on hydralazine 25 mg daily.  She is not on ACE or ARB due to her poor renal function.  5.  Hypertension: Her blood pressures of above target today, but she has not had all of her medications because of the appointment.  Compliance with all of her drugs is encouraged.  6.  Chronic kidney disease: -She reiterates her resolved not to go on dialysis. - Continue current therapy, follow-up with PCP  Current medicines are reviewed at length with the patient today.  The patient has concerns regarding medicines.  Concerns were addressed  The following changes have been made: None  Labs/ tests ordered today include:   Orders Placed This Encounter  Procedures  . Basic metabolic panel  . EKG 12-Lead     Disposition:   FU with Dr. Antoine Poche  Signed, Theodore Demark, PA-C  11/10/2017 4:45 PM    [facility] Phone: [phone]; Fax: [phone]  This note was written with the assistance of speech recognition software. Please excuse any transcriptional errors.

## 2017-11-10 NOTE — Patient Instructions (Addendum)
Medication Instructions:   Your physician recommends that you continue on your current medications as directed. Please refer to the Current Medication list given to you today.   If you need a refill on your cardiac medications before your next appointment, please call your pharmacy.  Labwork:  BMET     Testing/Procedures: NONE ORDERED  TODAY    Follow-Up: AS SCHEDULED WITH [facility]    Any Other Special Instructions Will Be Listed Below (If Applicable).  MAKES SURE YOUR ALL FLUID INTAKE IS TO 1 TO 1/2 QUART A DAY   LOW SODIUM PER MEAL 500 MG   BOILED EGG WHITES  MAKES SURE YOU ARE EATING 4 X'S A DAY WITH MEAL REPLACEMENT BEING TWO OF THOSE DAYS.

## 2017-11-11 ENCOUNTER — Ambulatory Visit: Payer: Medicare Other

## 2017-11-11 DIAGNOSIS — E11610 Type 2 diabetes mellitus with diabetic neuropathic arthropathy: Secondary | ICD-10-CM | POA: Diagnosis not present

## 2017-11-11 DIAGNOSIS — R600 Localized edema: Secondary | ICD-10-CM | POA: Diagnosis not present

## 2017-11-11 DIAGNOSIS — L97415 Non-pressure chronic ulcer of right heel and midfoot with muscle involvement without evidence of necrosis: Secondary | ICD-10-CM | POA: Diagnosis not present

## 2017-11-11 DIAGNOSIS — I872 Venous insufficiency (chronic) (peripheral): Secondary | ICD-10-CM | POA: Diagnosis not present

## 2017-11-11 DIAGNOSIS — I898 Other specified noninfective disorders of lymphatic vessels and lymph nodes: Secondary | ICD-10-CM | POA: Diagnosis not present

## 2017-11-11 DIAGNOSIS — E11621 Type 2 diabetes mellitus with foot ulcer: Secondary | ICD-10-CM | POA: Diagnosis not present

## 2017-11-11 DIAGNOSIS — I890 Lymphedema, not elsewhere classified: Secondary | ICD-10-CM | POA: Diagnosis not present

## 2017-11-11 DIAGNOSIS — L97919 Non-pressure chronic ulcer of unspecified part of right lower leg with unspecified severity: Secondary | ICD-10-CM | POA: Diagnosis not present

## 2017-11-11 DIAGNOSIS — I87331 Chronic venous hypertension (idiopathic) with ulcer and inflammation of right lower extremity: Secondary | ICD-10-CM | POA: Diagnosis not present

## 2017-11-12 ENCOUNTER — Ambulatory Visit: Payer: Medicare Other | Admitting: Physician Assistant

## 2017-11-14 DIAGNOSIS — Z79891 Long term (current) use of opiate analgesic: Secondary | ICD-10-CM | POA: Diagnosis not present

## 2017-11-14 DIAGNOSIS — R238 Other skin changes: Secondary | ICD-10-CM | POA: Diagnosis not present

## 2017-11-14 DIAGNOSIS — I5042 Chronic combined systolic (congestive) and diastolic (congestive) heart failure: Secondary | ICD-10-CM | POA: Diagnosis not present

## 2017-11-14 DIAGNOSIS — Z794 Long term (current) use of insulin: Secondary | ICD-10-CM | POA: Diagnosis not present

## 2017-11-14 DIAGNOSIS — E11610 Type 2 diabetes mellitus with diabetic neuropathic arthropathy: Secondary | ICD-10-CM | POA: Diagnosis not present

## 2017-11-14 DIAGNOSIS — I350 Nonrheumatic aortic (valve) stenosis: Secondary | ICD-10-CM | POA: Diagnosis not present

## 2017-11-14 DIAGNOSIS — E1122 Type 2 diabetes mellitus with diabetic chronic kidney disease: Secondary | ICD-10-CM | POA: Diagnosis not present

## 2017-11-14 DIAGNOSIS — N183 Chronic kidney disease, stage 3 (moderate): Secondary | ICD-10-CM | POA: Diagnosis not present

## 2017-11-14 DIAGNOSIS — Z7902 Long term (current) use of antithrombotics/antiplatelets: Secondary | ICD-10-CM | POA: Diagnosis not present

## 2017-11-14 DIAGNOSIS — E1151 Type 2 diabetes mellitus with diabetic peripheral angiopathy without gangrene: Secondary | ICD-10-CM | POA: Diagnosis not present

## 2017-11-14 DIAGNOSIS — Z7982 Long term (current) use of aspirin: Secondary | ICD-10-CM | POA: Diagnosis not present

## 2017-11-18 ENCOUNTER — Ambulatory Visit: Payer: Medicare Other | Admitting: Physician Assistant

## 2017-11-19 ENCOUNTER — Ambulatory Visit (INDEPENDENT_AMBULATORY_CARE_PROVIDER_SITE_OTHER): Payer: Medicare Other | Admitting: Pulmonary Disease

## 2017-11-19 ENCOUNTER — Encounter: Payer: Self-pay | Admitting: Pulmonary Disease

## 2017-11-19 VITALS — BP 148/80 | HR 55 | Ht 66.0 in | Wt 145.0 lb

## 2017-11-19 DIAGNOSIS — R0602 Shortness of breath: Secondary | ICD-10-CM | POA: Diagnosis not present

## 2017-11-19 NOTE — Patient Instructions (Addendum)
I have reviewed your records It is likely that your shortness of breath is from the heart failure and kidney failure We can repeat the pulmonary function test if you wish to see if we can get a better result Follow-up in pulmonary clinic as needed.  Please call us if there is any change in your symptoms or if you need a reassessment.

## 2017-11-19 NOTE — Progress Notes (Signed)
Emily Miranda    [id number]    [birth year]  Primary Care Physician:Redding, Valrie Hart, MD  Referring Physician: Noni Saupe, MD [address] [place], [place] [address]  Chief complaint: Consult for dyspnea, abnormal PFTs  HPI: 77 year old with history of coronary artery disease, chronic kidney disease, diabetes, hypertension, hyperlipidemia, pulmonary hypertension  She had an admission end of May 2019 for CHF exacerbation, acute kidney injury on chronic kidney disease, Klebsiella UTI.  She was initially on BiPAP and transitioned to nasal cannula.  She was diuresed with improvement in respiratory status and pulmonary edema on chest x-ray. PFT at that time which showed possible restrictive disease but the test was poor quality.  She has been referred to pulmonary for further evaluation.   She has Charcot's foot secondary to diabetes and with being evaluated at [facility] for amputation.  She recently saw her cardiologist for clearance for surgery.  Pets: No pets Occupation: Used to work as a Insurance claims handler. Exposures: No known exposures, no leak, hot tub, Jacuzzi Smoking history: Smoker Travel history: No significant travel.  Lived in [place] for 2 years in the past.  Outpatient Encounter Medications as of 11/19/2017  Medication Sig  . Ascorbic Acid (VITAMIN C) 1000 MG tablet Take 1,000 mg by mouth daily.  Marland Kitchen aspirin EC 81 MG tablet Take 1 tablet (81 mg total) by mouth daily.  Marland Kitchen atorvastatin (LIPITOR) 40 MG tablet Take 1 tablet (40 mg total) by mouth daily at 6 PM.  . cephALEXin (KEFLEX) 500 MG capsule Take 1 capsule (500 mg total) by mouth 3 (three) times daily.  . clopidogrel (PLAVIX) 75 MG tablet Take 1 tablet (75 mg total) by mouth daily with breakfast.  . collagenase (SANTYL) ointment Apply topically daily. Apply to LE plantar surface daily.  . cyclobenzaprine (FLEXERIL) 10 MG tablet Take 10 mg by mouth 3 (three) times daily.   . diclofenac sodium  (VOLTAREN) 1 % GEL Apply 2 g topically 4 (four) times daily. Please apply to let hip.  . fexofenadine (ALLEGRA) 180 MG tablet Take 180 mg by mouth daily.  . fluticasone (FLONASE) 50 MCG/ACT nasal spray Place 1 spray into both nostrils daily as needed (seasonal allergies).   . furosemide (LASIX) 40 MG tablet Take 1 tablet (40 mg total) by mouth daily.  Marland Kitchen glimepiride (AMARYL) 4 MG tablet Take 2-4 mg by mouth See admin instructions. Take 1 tablet by mouth every morning, and 1/2 tablet every evening  . HUMALOG KWIKPEN 100 UNIT/ML KiwkPen Inject 2 Units into the skin daily with supper.  . hydrALAZINE (APRESOLINE) 25 MG tablet Take 25 mg by mouth every 8 (eight) hours.  Marland Kitchen HYDROcodone-acetaminophen (NORCO/VICODIN) 5-325 MG tablet Take 1 tablet by mouth every 6 (six) hours as needed for moderate pain.  Marland Kitchen insulin glargine (LANTUS) 100 UNIT/ML injection Inject 0.12 mLs (12 Units total) into the skin at bedtime. (Patient taking differently: Inject 40 Units into the skin at bedtime. )  . isosorbide mononitrate (IMDUR) 30 MG 24 hr tablet Take 1 tablet (30 mg total) by mouth daily.  Marland Kitchen linagliptin (TRADJENTA) 5 MG TABS tablet Take 5 mg by mouth daily.  . mupirocin cream (BACTROBAN) 2 % Apply 1 application topically 2 (two) times daily.  . nitroGLYCERIN (NITROSTAT) 0.4 MG SL tablet Place 1 tablet (0.4 mg total) under the tongue every 5 (five) minutes x 3 doses as needed for chest pain.  . pantoprazole (PROTONIX) 40 MG tablet  Take 1 tablet (40 mg total) by mouth daily.  Bertram Gala Glycol-Propyl Glycol (SYSTANE OP) Place 1 drop into both eyes daily as needed (dry eyes).  . polyethylene glycol (MIRALAX / GLYCOLAX) packet Take 17 g by mouth daily as needed (constipation). Mix in 8 oz liquid and drink  . silver sulfADIAZINE (SILVADENE) 1 % cream Apply topically daily. Apply to right lower extremities affected areas, not to the plantar region  . carvedilol (COREG) 25 MG tablet Take 1 tablet (25 mg total) by mouth 2  (two) times daily with a meal.   No facility-administered encounter medications on file as of 11/19/2017.     Allergies as of 11/19/2017 - Review Complete 11/19/2017  Allergen Reaction Noted  . Levofloxacin Nausea And Vomiting and Other (See Comments) 03/25/2016  . Amlodipine Other (See Comments) 11/04/2016  . Gabapentin Other (See Comments) 07/29/2016  . Hydrochlorothiazide Other (See Comments) 11/04/2016  . Ibuprofen Other (See Comments) 11/04/2016  . Metformin and related Other (See Comments) 11/04/2016  . Simvastatin Other (See Comments) 11/04/2016    Past Medical History:  Diagnosis Date  . AKI (acute kidney injury) (HCC)   . Aortic stenosis, mild   . CAD (coronary artery disease)    a. 09/2016 NSTEMI/Cath: RCA 70p/m, RPDA 95ost-->Med Rx;  b. 10/2016 PCI: RCA 70p/m (2.25 x 15 Resolute Onyx DES), RPDA 95ost (2.0 x 12 Resolute Onyx DES).  . Charcot foot due to diabetes mellitus (HCC)   . Chronic combined systolic (congestive) and diastolic (congestive) heart failure (HCC)    a. 07/2016 Echo: EF 40-45%, Gr2 DD, mild AS, triv AI, mild MR, mod dil LA.  . Diabetic peripheral neuropathy (HCC)   . Heart murmur   . History of blood transfusion    "related to low HgB"  . History of kidney stones   . History of stomach ulcers   . HTN (hypertension)   . Hyperlipidemia   . Migraine    "maybe once/month" (08/28/2017)  . Myocardial infarction (HCC) 07/2017   while at [facility], s/p cath with DES LAD and CFX  . NSTEMI (non-ST elevated myocardial infarction) (HCC) 10/06/2016  . Pancreatitis    "my dad died w/it at 87 yr old"  . Physical deconditioning    a. W/C bound.  . Pneumonia 10/2016  . Stasis dermatitis of both legs 05/2017  . Type II diabetes mellitus (HCC)     Past Surgical History:  Procedure Laterality Date  . ABDOMINAL HYSTERECTOMY    . APPENDECTOMY    . CARPAL TUNNEL RELEASE Bilateral   . CATARACT EXTRACTION W/ INTRAOCULAR LENS  IMPLANT, BILATERAL Bilateral   . CORONARY  ANGIOPLASTY WITH STENT PLACEMENT  07/2017   "@ [facility]"  . CORONARY STENT INTERVENTION N/A 11/05/2016   Procedure: Coronary Stent Intervention;  Surgeon: Lennette Bihari, MD;  Location: [facility] INVASIVE CV LAB;  Service: Cardiovascular;  Laterality: N/A;  . DILATION AND CURETTAGE OF UTERUS    . EXCISIONAL HEMORRHOIDECTOMY    . FOOT SURGERY Right    "ulcer"  . GLAUCOMA SURGERY Bilateral   . LAPAROSCOPIC INCISIONAL / UMBILICAL / VENTRAL HERNIA REPAIR  01/04/2008   Dr Bertram Savin  . LAPAROSCOPIC LYSIS OF ADHESIONS  2007   Dr Donovan Kail  . LEFT HEART CATH AND CORONARY ANGIOGRAPHY N/A 10/11/2016   Procedure: Left Heart Cath and Coronary Angiography;  Surgeon: Yvonne Kendall, MD;  Location: [facility] INVASIVE CV LAB;  Service: Cardiovascular;  Laterality: N/A;  . OVARIAN CYST REMOVAL    . RIGHT/LEFT HEART  CATH AND CORONARY ANGIOGRAPHY N/A 10/09/2017   Procedure: RIGHT/LEFT HEART CATH AND CORONARY ANGIOGRAPHY;  Surgeon: Lyn Records, MD;  Location: [facility] INVASIVE CV LAB;  Service: Cardiovascular;  Laterality: N/A;  . ROBOT ASSISTED PYELOPLASTY  02/2007   Dr Laverle Patter  . SUTURE REMOVAL  08/17/2009   Dr Bertram Savin .  Right paramedian GoreTex stitch  . TONSILLECTOMY      Family History  Problem Relation Age of Onset  . Chronic Renal Failure Mother   . Diabetes Father   . Heart attack Father 97    Social History   Socioeconomic History  . Marital status: Married    Spouse name: Not on file  . Number of children: Not on file  . Years of education: Not on file  . Highest education level: Not on file  Occupational History  . Not on file  Social Needs  . Financial resource strain: Not on file  . Food insecurity:    Worry: Not on file    Inability: Not on file  . Transportation needs:    Medical: Not on file    Non-medical: Not on file  Tobacco Use  . Smoking status: Never Smoker  . Smokeless tobacco: Never Used  Substance and Sexual Activity  . Alcohol use: No  . Drug use: No  . Sexual activity:  Not on file  Lifestyle  . Physical activity:    Days per week: Not on file    Minutes per session: Not on file  . Stress: Not on file  Relationships  . Social connections:    Talks on phone: Not on file    Gets together: Not on file    Attends religious service: Not on file    Active member of club or organization: Not on file    Attends meetings of clubs or organizations: Not on file    Relationship status: Not on file  . Intimate partner violence:    Fear of current or ex partner: Not on file    Emotionally abused: Not on file    Physically abused: Not on file    Forced sexual activity: Not on file  Other Topics Concern  . Not on file  Social History Narrative  . Not on file    Review of systems: Review of Systems  Constitutional: Negative for fever and chills.  HENT: Negative.   Eyes: Negative for blurred vision.  Respiratory: as per HPI  Cardiovascular: Negative for chest pain and palpitations.  Gastrointestinal: Negative for vomiting, diarrhea, blood per rectum. Genitourinary: Negative for dysuria, urgency, frequency and hematuria.  Musculoskeletal: Negative for myalgias, back pain and joint pain.  Skin: Negative for itching and rash.  Neurological: Negative for dizziness, tremors, focal weakness, seizures and loss of consciousness.  Endo/Heme/Allergies: Negative for environmental allergies.  Psychiatric/Behavioral: Negative for depression, suicidal ideas and hallucinations.  All other systems reviewed and are negative.  Physical Exam: Blood pressure (!) 148/80, pulse (!) 55, height 5\' 6"  (1.676 m), weight 145 lb (65.8 kg), SpO2 98 %. Gen:      No acute distress HEENT:  EOMI, sclera anicteric Neck:     No masses; no thyromegaly Lungs:    Clear to auscultation bilaterally; normal respiratory effort CV:         Regular rate and rhythm; no murmurs Abd:      + bowel sounds; soft, non-tender; no palpable masses, no distension Ext:    No edema; adequate peripheral  perfusion Skin:  Warm and dry; no rash Neuro: alert and oriented x 3 Psych: normal mood and affect  Data Reviewed: PFTs 10/16/2017 FVC 0.77 [25%], FEV1 0.77 [33%], F/F 100 Unreliable test as she could not exhale past 1 second.  CT abdomen 12/02/2016-visualized lung bases show bilateral moderate effusions with associated atelectasis VQ scan 10/09/2017-very low probability of pulmonary embolism Chest x-ray 10/16/17-CHF, bilateral effusions with associated atelectasis. I have reviewed the images personally.  Assessment:  Consult for dyspnea and abnormal PFT Dyspnea is secondary to CHF, volume overload, renal failure.  I do not see any evidence of intrinsic lung problems or interstitial lung disease  PFTs done recently are unreliable as FVC and FEV1 have the same value, she could not exhale past 1 second and was unable to do lung volumes or diffusion capacity.  We discussed getting repeat PFTs but she is not interested in retrying. I have asked her to return to the pulmonary clinic as needed.  We are available for reassessment if needed in the future.  Plan/Recommendations: - Return to pulmonary clinic as needed.  Chilton Greathouse MD [facility] 11/19/2017, 2:43 PM  CC: Noni Saupe, MD

## 2017-11-26 DIAGNOSIS — Z7982 Long term (current) use of aspirin: Secondary | ICD-10-CM | POA: Diagnosis not present

## 2017-11-26 DIAGNOSIS — I350 Nonrheumatic aortic (valve) stenosis: Secondary | ICD-10-CM | POA: Diagnosis not present

## 2017-11-26 DIAGNOSIS — Z794 Long term (current) use of insulin: Secondary | ICD-10-CM | POA: Diagnosis not present

## 2017-11-26 DIAGNOSIS — E1122 Type 2 diabetes mellitus with diabetic chronic kidney disease: Secondary | ICD-10-CM | POA: Diagnosis not present

## 2017-11-26 DIAGNOSIS — E11610 Type 2 diabetes mellitus with diabetic neuropathic arthropathy: Secondary | ICD-10-CM | POA: Diagnosis not present

## 2017-11-26 DIAGNOSIS — Z79891 Long term (current) use of opiate analgesic: Secondary | ICD-10-CM | POA: Diagnosis not present

## 2017-11-26 DIAGNOSIS — N183 Chronic kidney disease, stage 3 (moderate): Secondary | ICD-10-CM | POA: Diagnosis not present

## 2017-11-26 DIAGNOSIS — E1151 Type 2 diabetes mellitus with diabetic peripheral angiopathy without gangrene: Secondary | ICD-10-CM | POA: Diagnosis not present

## 2017-11-26 DIAGNOSIS — I5042 Chronic combined systolic (congestive) and diastolic (congestive) heart failure: Secondary | ICD-10-CM | POA: Diagnosis not present

## 2017-11-26 DIAGNOSIS — Z7902 Long term (current) use of antithrombotics/antiplatelets: Secondary | ICD-10-CM | POA: Diagnosis not present

## 2017-11-26 DIAGNOSIS — R238 Other skin changes: Secondary | ICD-10-CM | POA: Diagnosis not present

## 2017-12-04 ENCOUNTER — Ambulatory Visit: Payer: Medicare Other | Admitting: Physician Assistant

## 2017-12-05 ENCOUNTER — Other Ambulatory Visit: Payer: Self-pay | Admitting: Sports Medicine

## 2017-12-05 ENCOUNTER — Telehealth: Payer: Self-pay | Admitting: *Deleted

## 2017-12-05 DIAGNOSIS — I890 Lymphedema, not elsewhere classified: Secondary | ICD-10-CM

## 2017-12-05 DIAGNOSIS — L97919 Non-pressure chronic ulcer of unspecified part of right lower leg with unspecified severity: Secondary | ICD-10-CM

## 2017-12-05 DIAGNOSIS — I83029 Varicose veins of left lower extremity with ulcer of unspecified site: Secondary | ICD-10-CM

## 2017-12-05 DIAGNOSIS — L97929 Non-pressure chronic ulcer of unspecified part of left lower leg with unspecified severity: Secondary | ICD-10-CM

## 2017-12-05 DIAGNOSIS — I83019 Varicose veins of right lower extremity with ulcer of unspecified site: Secondary | ICD-10-CM

## 2017-12-05 MED ORDER — CEPHALEXIN 500 MG PO CAPS: 500.0000 mg | ORAL_CAPSULE | Freq: Three times a day (TID) | ORAL | 1 refills | Status: DC

## 2017-12-05 NOTE — Progress Notes (Signed)
Refilled keflex Patient to continue with [facility] wound care and is awaiting eval for amputation -Dr. Marylene Land

## 2017-12-05 NOTE — Telephone Encounter (Signed)
Sent!

## 2017-12-05 NOTE — Telephone Encounter (Signed)
Patient is requesting a refill of the antibiotic.  States you told her she should remain on an antibiotic until she has her amputation.  Please advise.

## 2017-12-15 ENCOUNTER — Telehealth: Payer: Self-pay | Admitting: *Deleted

## 2017-12-15 NOTE — Telephone Encounter (Signed)
Patient called requesting a refill on tramadol to get her through until amputation August 29th at [facility].  States the pain has been unbearable the past 2 nights.

## 2017-12-15 NOTE — Telephone Encounter (Signed)
She will have to get Tramadol from her PCP or her doctor who is planning on doing her amputation -Dr Marylene Land

## 2017-12-19 DIAGNOSIS — I872 Venous insufficiency (chronic) (peripheral): Secondary | ICD-10-CM | POA: Diagnosis not present

## 2017-12-19 DIAGNOSIS — I890 Lymphedema, not elsewhere classified: Secondary | ICD-10-CM | POA: Diagnosis not present

## 2017-12-19 DIAGNOSIS — I898 Other specified noninfective disorders of lymphatic vessels and lymph nodes: Secondary | ICD-10-CM | POA: Diagnosis not present

## 2018-01-01 ENCOUNTER — Inpatient Hospital Stay (HOSPITAL_COMMUNITY)
Admission: EM | Admit: 2018-01-01 | Discharge: 2018-01-06 | DRG: 557 | Disposition: A | Discharge status: Home / Self Care | Payer: Medicare Other | Attending: Student in an Organized Health Care Education/Training Program | Admitting: Student in an Organized Health Care Education/Training Program

## 2018-01-01 ENCOUNTER — Encounter (HOSPITAL_COMMUNITY): Payer: Self-pay

## 2018-01-01 DIAGNOSIS — I110 Hypertensive heart disease with heart failure: Secondary | ICD-10-CM | POA: Diagnosis not present

## 2018-01-01 DIAGNOSIS — Z993 Dependence on wheelchair: Secondary | ICD-10-CM

## 2018-01-01 DIAGNOSIS — I5040 Unspecified combined systolic (congestive) and diastolic (congestive) heart failure: Secondary | ICD-10-CM | POA: Diagnosis not present

## 2018-01-01 DIAGNOSIS — M25452 Effusion, left hip: Secondary | ICD-10-CM | POA: Diagnosis present

## 2018-01-01 DIAGNOSIS — I5042 Chronic combined systolic (congestive) and diastolic (congestive) heart failure: Secondary | ICD-10-CM | POA: Diagnosis present

## 2018-01-01 DIAGNOSIS — R52 Pain, unspecified: Secondary | ICD-10-CM

## 2018-01-01 DIAGNOSIS — L03115 Cellulitis of right lower limb: Secondary | ICD-10-CM | POA: Diagnosis present

## 2018-01-01 DIAGNOSIS — Z961 Presence of intraocular lens: Secondary | ICD-10-CM | POA: Diagnosis present

## 2018-01-01 DIAGNOSIS — I350 Nonrheumatic aortic (valve) stenosis: Secondary | ICD-10-CM | POA: Diagnosis present

## 2018-01-01 DIAGNOSIS — M609 Myositis, unspecified: Secondary | ICD-10-CM | POA: Diagnosis present

## 2018-01-01 DIAGNOSIS — I272 Pulmonary hypertension, unspecified: Secondary | ICD-10-CM | POA: Diagnosis present

## 2018-01-01 DIAGNOSIS — Z90710 Acquired absence of both cervix and uterus: Secondary | ICD-10-CM

## 2018-01-01 DIAGNOSIS — E1122 Type 2 diabetes mellitus with diabetic chronic kidney disease: Secondary | ICD-10-CM | POA: Diagnosis present

## 2018-01-01 DIAGNOSIS — M7062 Trochanteric bursitis, left hip: Secondary | ICD-10-CM | POA: Diagnosis present

## 2018-01-01 DIAGNOSIS — I420 Dilated cardiomyopathy: Secondary | ICD-10-CM | POA: Diagnosis present

## 2018-01-01 DIAGNOSIS — Z87442 Personal history of urinary calculi: Secondary | ICD-10-CM

## 2018-01-01 DIAGNOSIS — E1142 Type 2 diabetes mellitus with diabetic polyneuropathy: Secondary | ICD-10-CM | POA: Diagnosis present

## 2018-01-01 DIAGNOSIS — M7061 Trochanteric bursitis, right hip: Secondary | ICD-10-CM | POA: Diagnosis present

## 2018-01-01 DIAGNOSIS — R748 Abnormal levels of other serum enzymes: Secondary | ICD-10-CM | POA: Diagnosis not present

## 2018-01-01 DIAGNOSIS — Z7951 Long term (current) use of inhaled steroids: Secondary | ICD-10-CM

## 2018-01-01 DIAGNOSIS — I440 Atrioventricular block, first degree: Secondary | ICD-10-CM | POA: Diagnosis present

## 2018-01-01 DIAGNOSIS — Z66 Do not resuscitate: Secondary | ICD-10-CM | POA: Diagnosis present

## 2018-01-01 DIAGNOSIS — R509 Fever, unspecified: Secondary | ICD-10-CM | POA: Diagnosis not present

## 2018-01-01 DIAGNOSIS — Z9842 Cataract extraction status, left eye: Secondary | ICD-10-CM

## 2018-01-01 DIAGNOSIS — Z888 Allergy status to other drugs, medicaments and biological substances status: Secondary | ICD-10-CM

## 2018-01-01 DIAGNOSIS — E11610 Type 2 diabetes mellitus with diabetic neuropathic arthropathy: Secondary | ICD-10-CM | POA: Diagnosis present

## 2018-01-01 DIAGNOSIS — E1165 Type 2 diabetes mellitus with hyperglycemia: Secondary | ICD-10-CM | POA: Diagnosis not present

## 2018-01-01 DIAGNOSIS — Z881 Allergy status to other antibiotic agents status: Secondary | ICD-10-CM

## 2018-01-01 DIAGNOSIS — R1012 Left upper quadrant pain: Secondary | ICD-10-CM

## 2018-01-01 DIAGNOSIS — E11628 Type 2 diabetes mellitus with other skin complications: Secondary | ICD-10-CM | POA: Diagnosis present

## 2018-01-01 DIAGNOSIS — N183 Chronic kidney disease, stage 3 (moderate): Secondary | ICD-10-CM | POA: Diagnosis present

## 2018-01-01 DIAGNOSIS — Z9841 Cataract extraction status, right eye: Secondary | ICD-10-CM

## 2018-01-01 DIAGNOSIS — I252 Old myocardial infarction: Secondary | ICD-10-CM

## 2018-01-01 DIAGNOSIS — E782 Mixed hyperlipidemia: Secondary | ICD-10-CM | POA: Diagnosis present

## 2018-01-01 DIAGNOSIS — I255 Ischemic cardiomyopathy: Secondary | ICD-10-CM | POA: Diagnosis not present

## 2018-01-01 DIAGNOSIS — N179 Acute kidney failure, unspecified: Secondary | ICD-10-CM | POA: Diagnosis present

## 2018-01-01 DIAGNOSIS — Z7982 Long term (current) use of aspirin: Secondary | ICD-10-CM

## 2018-01-01 DIAGNOSIS — I214 Non-ST elevation (NSTEMI) myocardial infarction: Secondary | ICD-10-CM | POA: Diagnosis not present

## 2018-01-01 DIAGNOSIS — Z833 Family history of diabetes mellitus: Secondary | ICD-10-CM

## 2018-01-01 DIAGNOSIS — I5041 Acute combined systolic (congestive) and diastolic (congestive) heart failure: Secondary | ICD-10-CM | POA: Diagnosis not present

## 2018-01-01 DIAGNOSIS — I2510 Atherosclerotic heart disease of native coronary artery without angina pectoris: Secondary | ICD-10-CM | POA: Diagnosis present

## 2018-01-01 DIAGNOSIS — N189 Chronic kidney disease, unspecified: Secondary | ICD-10-CM | POA: Diagnosis not present

## 2018-01-01 DIAGNOSIS — Z955 Presence of coronary angioplasty implant and graft: Secondary | ICD-10-CM

## 2018-01-01 DIAGNOSIS — M25552 Pain in left hip: Secondary | ICD-10-CM | POA: Diagnosis not present

## 2018-01-01 DIAGNOSIS — M1610 Unilateral primary osteoarthritis, unspecified hip: Secondary | ICD-10-CM | POA: Diagnosis present

## 2018-01-01 DIAGNOSIS — E875 Hyperkalemia: Secondary | ICD-10-CM | POA: Diagnosis present

## 2018-01-01 DIAGNOSIS — L97419 Non-pressure chronic ulcer of right heel and midfoot with unspecified severity: Secondary | ICD-10-CM | POA: Diagnosis not present

## 2018-01-01 DIAGNOSIS — E785 Hyperlipidemia, unspecified: Secondary | ICD-10-CM | POA: Diagnosis not present

## 2018-01-01 DIAGNOSIS — Z8249 Family history of ischemic heart disease and other diseases of the circulatory system: Secondary | ICD-10-CM

## 2018-01-01 DIAGNOSIS — M25559 Pain in unspecified hip: Secondary | ICD-10-CM

## 2018-01-01 DIAGNOSIS — Z79899 Other long term (current) drug therapy: Secondary | ICD-10-CM

## 2018-01-01 DIAGNOSIS — Z8711 Personal history of peptic ulcer disease: Secondary | ICD-10-CM

## 2018-01-01 DIAGNOSIS — I129 Hypertensive chronic kidney disease with stage 1 through stage 4 chronic kidney disease, or unspecified chronic kidney disease: Secondary | ICD-10-CM | POA: Diagnosis present

## 2018-01-01 DIAGNOSIS — Z8701 Personal history of pneumonia (recurrent): Secondary | ICD-10-CM

## 2018-01-01 DIAGNOSIS — E11621 Type 2 diabetes mellitus with foot ulcer: Secondary | ICD-10-CM | POA: Diagnosis not present

## 2018-01-01 DIAGNOSIS — I21A1 Myocardial infarction type 2: Secondary | ICD-10-CM | POA: Diagnosis not present

## 2018-01-01 DIAGNOSIS — D649 Anemia, unspecified: Secondary | ICD-10-CM | POA: Diagnosis present

## 2018-01-01 DIAGNOSIS — Z841 Family history of disorders of kidney and ureter: Secondary | ICD-10-CM

## 2018-01-01 DIAGNOSIS — M810 Age-related osteoporosis without current pathological fracture: Secondary | ICD-10-CM | POA: Diagnosis not present

## 2018-01-01 DIAGNOSIS — R609 Edema, unspecified: Secondary | ICD-10-CM | POA: Diagnosis not present

## 2018-01-01 DIAGNOSIS — I130 Hypertensive heart and chronic kidney disease with heart failure and stage 1 through stage 4 chronic kidney disease, or unspecified chronic kidney disease: Secondary | ICD-10-CM | POA: Diagnosis not present

## 2018-01-01 DIAGNOSIS — Z9889 Other specified postprocedural states: Secondary | ICD-10-CM | POA: Diagnosis not present

## 2018-01-01 DIAGNOSIS — N200 Calculus of kidney: Secondary | ICD-10-CM | POA: Diagnosis not present

## 2018-01-01 DIAGNOSIS — L03119 Cellulitis of unspecified part of limb: Secondary | ICD-10-CM

## 2018-01-01 DIAGNOSIS — Z7902 Long term (current) use of antithrombotics/antiplatelets: Secondary | ICD-10-CM

## 2018-01-01 DIAGNOSIS — Z792 Long term (current) use of antibiotics: Secondary | ICD-10-CM

## 2018-01-01 DIAGNOSIS — J80 Acute respiratory distress syndrome: Secondary | ICD-10-CM | POA: Diagnosis not present

## 2018-01-01 DIAGNOSIS — I872 Venous insufficiency (chronic) (peripheral): Secondary | ICD-10-CM | POA: Diagnosis present

## 2018-01-01 DIAGNOSIS — I10 Essential (primary) hypertension: Secondary | ICD-10-CM | POA: Diagnosis not present

## 2018-01-01 DIAGNOSIS — Z794 Long term (current) use of insulin: Secondary | ICD-10-CM

## 2018-01-01 DIAGNOSIS — M1612 Unilateral primary osteoarthritis, left hip: Secondary | ICD-10-CM | POA: Diagnosis not present

## 2018-01-01 LAB — COMPREHENSIVE METABOLIC PANEL
ALT: 17 U/L (ref 0–44)
AST: 14 U/L — ABNORMAL LOW (ref 15–41)
Albumin: 2.4 g/dL — ABNORMAL LOW (ref 3.5–5.0)
Alkaline Phosphatase: 114 U/L (ref 38–126)
Anion gap: 8 (ref 5–15)
BUN: 89 mg/dL — ABNORMAL HIGH (ref 8–23)
CO2: 15 mmol/L — ABNORMAL LOW (ref 22–32)
Calcium: 9 mg/dL (ref 8.9–10.3)
Chloride: 113 mmol/L — ABNORMAL HIGH (ref 98–111)
Creatinine, Ser: 2.6 mg/dL — ABNORMAL HIGH (ref 0.44–1.00)
GFR calc Af Amer: 20 mL/min — ABNORMAL LOW (ref >60)
GFR calc non Af Amer: 17 mL/min — ABNORMAL LOW (ref >60)
Glucose, Bld: 302 mg/dL — ABNORMAL HIGH (ref 70–99)
Potassium: 5.6 mmol/L — ABNORMAL HIGH (ref 3.5–5.1)
Sodium: 136 mmol/L (ref 135–145)
Total Bilirubin: 0.4 mg/dL (ref 0.3–1.2)
Total Protein: 7 g/dL (ref 6.5–8.1)

## 2018-01-01 LAB — CBC WITH DIFFERENTIAL/PLATELET
Abs Immature Granulocytes: 0.1 10*3/uL (ref 0.0–0.1)
Basophils Absolute: 0 10*3/uL (ref 0.0–0.1)
Basophils Relative: 0 %
Eosinophils Absolute: 0.1 10*3/uL (ref 0.0–0.7)
Eosinophils Relative: 0 %
HCT: 30.4 % — ABNORMAL LOW (ref 36.0–46.0)
Hemoglobin: 9 g/dL — ABNORMAL LOW (ref 12.0–15.0)
Immature Granulocytes: 1 %
Lymphocytes Relative: 3 %
Lymphs Abs: 0.5 10*3/uL — ABNORMAL LOW (ref 0.7–4.0)
MCH: 25.4 pg — ABNORMAL LOW (ref 26.0–34.0)
MCHC: 29.6 g/dL — ABNORMAL LOW (ref 30.0–36.0)
MCV: 85.9 fL (ref 78.0–100.0)
Monocytes Absolute: 0.2 10*3/uL (ref 0.1–1.0)
Monocytes Relative: 2 %
Neutro Abs: 14.8 10*3/uL — ABNORMAL HIGH (ref 1.7–7.7)
Neutrophils Relative %: 94 %
Platelets: 295 10*3/uL (ref 150–400)
RBC: 3.54 MIL/uL — ABNORMAL LOW (ref 3.87–5.11)
RDW: 18.4 % — ABNORMAL HIGH (ref 11.5–15.5)
WBC: 15.7 10*3/uL — ABNORMAL HIGH (ref 4.0–10.5)

## 2018-01-01 LAB — TYPE AND SCREEN
ABO/RH(D): O POS
Antibody Screen: NEGATIVE

## 2018-01-01 LAB — I-STAT CG4 LACTIC ACID, ED
Lactic Acid, Venous: 0.73 mmol/L (ref 0.5–1.9)
Lactic Acid, Venous: 0.77 mmol/L (ref 0.5–1.9)

## 2018-01-01 LAB — CBG MONITORING, ED: Glucose-Capillary: 276 mg/dL — ABNORMAL HIGH (ref 70–99)

## 2018-01-01 MED ORDER — SODIUM CHLORIDE 0.9 % IV BOLUS
500.0000 mL | Freq: Once | INTRAVENOUS | Status: AC
  Administered 2018-01-01: 500 mL via INTRAVENOUS

## 2018-01-01 MED ORDER — HYDROCODONE-ACETAMINOPHEN 5-325 MG PO TABS
1.0000 | ORAL_TABLET | Freq: Once | ORAL | Status: AC
  Administered 2018-01-01: 1 via ORAL
  Filled 2018-01-01: qty 1

## 2018-01-01 MED ORDER — SODIUM CHLORIDE 0.9 % IV BOLUS
500.0000 mL | Freq: Once | INTRAVENOUS | Status: AC
Start: 2018-01-01 — End: 2018-01-01
  Administered 2018-01-01: 500 mL via INTRAVENOUS

## 2018-01-01 MED ORDER — FENTANYL CITRATE (PF) 100 MCG/2ML IJ SOLN
25.0000 ug | Freq: Once | INTRAMUSCULAR | Status: AC
Start: 2018-01-01 — End: 2018-01-01
  Administered 2018-01-01: 25 ug via INTRAVENOUS
  Filled 2018-01-01: qty 2

## 2018-01-01 MED ORDER — VANCOMYCIN HCL IN DEXTROSE 1-5 GM/200ML-% IV SOLN
1000.0000 mg | Freq: Once | INTRAVENOUS | Status: AC
  Administered 2018-01-01: 1000 mg via INTRAVENOUS
  Filled 2018-01-01: qty 200

## 2018-01-01 NOTE — ED Notes (Signed)
Pt is on a Purewick.

## 2018-01-01 NOTE — H&P (Addendum)
Date: 01/02/2018               Patient Name:  Emily Miranda MRN: [id number]  DOB: 23-Nov-1940 Age / Sex: 77 y.o., female   PCP: Noni Saupe, MD         Medical Service: Internal Medicine Teaching Service         Attending Physician: Dr. Oswaldo Done, Marquita Palms, *    First Contact: Dr. Gwyneth Revels Pager: [phone]  Second Contact: Dr. Obie Dredge Pager: [phone]       After Hours (After 5p/  First Contact Pager: [phone]  weekends / holidays): Second Contact Pager: [phone]   Chief Complaint: flank pain  History of Present Illness: Ms. Orchard is a 77 y/o female with PMHx of CAD with multiple stents, CKD stage III, DM, HTN, HLD, combined systolic and diastolic heart failure presents with 2 week history of progressively worsening left flank and hip pain. The pain is described as stabbing, constant and radiates deep into her left groin, then to the back. It has some radiation along her mid-lower abdomen as well. She is having more pain when she bears any weight on the left heel. Also complains of associated low-grade fever for one week. Has never had a joint infection before. She has tried Tramadol for the pain with minimal relief. No palliating factors. Denies history of similar symptoms. Endorses decreased appetite, urinary urgency, and sensation of incomplete bladder emptying. Denies vomiting, hematuria, burning with urination, chest pain, shortness of breath.   She has history of severe bilateral stasis dermatitis and Charcot foot on the right. She is followed by [facility] with planned right AKA next week. She states her swelling has progressively gotten worse with increased drainage and pain in her right leg. She has been changing the dressings at home herself; she ran out of supplies and has been using paper towels.    Meds:   No current facility-administered medications on file prior to encounter.    Current Outpatient Medications on File Prior to Encounter  Medication Sig Dispense  Refill  . Ascorbic Acid (VITAMIN C) 1000 MG tablet Take 1,000 mg by mouth daily.    Marland Kitchen aspirin EC 81 MG tablet Take 1 tablet (81 mg total) by mouth daily. 150 tablet 2  . atorvastatin (LIPITOR) 40 MG tablet Take 1 tablet (40 mg total) by mouth daily at 6 PM. 30 tablet 3  . carvedilol (COREG) 25 MG tablet Take 1 tablet (25 mg total) by mouth 2 (two) times daily with a meal. 60 tablet 0  . cephALEXin (KEFLEX) 500 MG capsule Take 1 capsule (500 mg total) by mouth 3 (three) times daily. 30 capsule 1  . clopidogrel (PLAVIX) 75 MG tablet Take 1 tablet (75 mg total) by mouth daily with breakfast. 30 tablet 6  . collagenase (SANTYL) ointment Apply topically daily. Apply to LE plantar surface daily. 15 g 0  . cyclobenzaprine (FLEXERIL) 10 MG tablet Take 10 mg by mouth 3 (three) times daily.   0  . diclofenac sodium (VOLTAREN) 1 % GEL Apply 2 g topically 4 (four) times daily. Please apply to let hip. 1 Tube 0  . fexofenadine (ALLEGRA) 180 MG tablet Take 180 mg by mouth daily.    . fluticasone (FLONASE) 50 MCG/ACT nasal spray Place 1 spray into both nostrils daily as needed (seasonal allergies).     . furosemide (LASIX) 40 MG tablet Take 1 tablet (40 mg total) by mouth daily. 30 tablet 5  .  glimepiride (AMARYL) 4 MG tablet Take 2-4 mg by mouth See admin instructions. Take 1 tablet by mouth every morning, and 1/2 tablet every evening  0  . HUMALOG KWIKPEN 100 UNIT/ML KiwkPen Inject 2 Units into the skin daily with supper.  3  . hydrALAZINE (APRESOLINE) 25 MG tablet Take 25 mg by mouth every 8 (eight) hours.    Marland Kitchen HYDROcodone-acetaminophen (NORCO/VICODIN) 5-325 MG tablet Take 1 tablet by mouth every 6 (six) hours as needed for moderate pain. 10 tablet 0  . insulin glargine (LANTUS) 100 UNIT/ML injection Inject 0.12 mLs (12 Units total) into the skin at bedtime. (Patient taking differently: Inject 40 Units into the skin at bedtime. ) 10 mL 11  . isosorbide mononitrate (IMDUR) 30 MG 24 hr tablet Take 1 tablet (30  mg total) by mouth daily. 30 tablet 6  . linagliptin (TRADJENTA) 5 MG TABS tablet Take 5 mg by mouth daily.    . mupirocin cream (BACTROBAN) 2 % Apply 1 application topically 2 (two) times daily. 15 g 0  . nitroGLYCERIN (NITROSTAT) 0.4 MG SL tablet Place 1 tablet (0.4 mg total) under the tongue every 5 (five) minutes x 3 doses as needed for chest pain. 25 tablet 3  . pantoprazole (PROTONIX) 40 MG tablet Take 1 tablet (40 mg total) by mouth daily. 90 tablet 1  . Polyethyl Glycol-Propyl Glycol (SYSTANE OP) Place 1 drop into both eyes daily as needed (dry eyes).    . polyethylene glycol (MIRALAX / GLYCOLAX) packet Take 17 g by mouth daily as needed (constipation). Mix in 8 oz liquid and drink    . silver sulfADIAZINE (SILVADENE) 1 % cream Apply topically daily. Apply to right lower extremities affected areas, not to the plantar region 50 g 0     Allergies: Allergies as of 01/01/2018 - Review Complete 01/01/2018  Allergen Reaction Noted  . Levofloxacin Nausea And Vomiting and Other (See Comments) 03/25/2016  . Amlodipine Other (See Comments) 11/04/2016  . Gabapentin Other (See Comments) 07/29/2016  . Hydrochlorothiazide Other (See Comments) 11/04/2016  . Ibuprofen Other (See Comments) 11/04/2016  . Metformin and related Other (See Comments) 11/04/2016  . Simvastatin Other (See Comments) 11/04/2016   Past Medical History:  Diagnosis Date  . AKI (acute kidney injury) (HCC)   . Aortic stenosis, mild   . CAD (coronary artery disease)    a. 09/2016 NSTEMI/Cath: RCA 70p/m, RPDA 95ost-->Med Rx;  b. 10/2016 PCI: RCA 70p/m (2.25 x 15 Resolute Onyx DES), RPDA 95ost (2.0 x 12 Resolute Onyx DES).  . Charcot foot due to diabetes mellitus (HCC)   . Chronic combined systolic (congestive) and diastolic (congestive) heart failure (HCC)    a. 07/2016 Echo: EF 40-45%, Gr2 DD, mild AS, triv AI, mild MR, mod dil LA.  . Diabetic peripheral neuropathy (HCC)   . Heart murmur   . History of blood transfusion     "related to low HgB"  . History of kidney stones   . History of stomach ulcers   . HTN (hypertension)   . Hyperlipidemia   . Migraine    "maybe once/month" (08/28/2017)  . Myocardial infarction (HCC) 07/2017   while at [facility], s/p cath with DES LAD and CFX  . NSTEMI (non-ST elevated myocardial infarction) (HCC) 10/06/2016  . Pancreatitis    "my dad died w/it at 57 yr old"  . Physical deconditioning    a. W/C bound.  . Pneumonia 10/2016  . Stasis dermatitis of both legs 05/2017  . Type II diabetes mellitus (  HCC)     Family History: mother - CKD; father - DM and MI at 71  Social History: non-ambulatory for 3 years; lives at home with husband; no tobacco, EtOH or illicit drug use   Review of Systems: A complete ROS was negative except as per HPI. Physical Exam: Blood pressure (!) 135/55, pulse 92, temperature 99.3 F (37.4 C), temperature source Oral, resp. rate 17, height 5\' 6"  (1.676 m), weight 66.3 kg, SpO2 96 %. General: A&Ox3, in mild distress secondary to pain  HEENT: normocephalic, atraumatic; PEERLA; mucous membranes appear dry CV: RRR; no murmurs, rubs or gallops Resp: no increased work of breathing; CTA bilaterally Abd: BS+; abdomen is soft, non-distended; TTP of LLQ with guarding; no rebound; + CVA tenderness on left  Skin: diaphoretic and warm Ext: Left hip with pain with external rotation of the foot, stiffness with flexion, pain directly over the left groin. The right leg is erythematous, edematous, ulceration of the skin of BLE with lichenification and nonpurulent, copious discharge exuding from wounds; faint distal pulses Psych: appropriate mood and affect             EKG: personally reviewed my interpretation is sinus rhythm; 1st degree AV block    Assessment & Plan by Problem: Active Problems:   DM type 2 causing CKD stage 3 (HCC)   Charcot foot due to diabetes mellitus (HCC)   Chronic combined systolic and diastolic heart failure (HCC)   Stasis  dermatitis of both legs   1. Fever and Left Hip Pain:  Seems high risk for septic arthritis of the left hip. Other risk factors include diabetes. Cannot bear weight on the left foot because of pain. On my bedside ultrasound exam I do see a small-moderate left hip effusion where the femoral head meets the femoral neck. Xray with moderate osteoarthritis of the hip, no fracture, no sign of avascular necrosis. Will consult with ortho, tentatively will order an MRI for now, but they may advise that we don't need it based on their assessment. Empiric antibiotics for now with Vancomycin and Ceftriaxone. Blood culture pending. Pain control Norco and Dilaudid for breakthrough.   2. AKI on CKD III; most recent baseline crt appears to be 1.8  - BUN:Cr ratio most consistent with pre-renal - Crt has already trended down 2.6>2.3 on IVF  - likely decreased PO intake in the setting of acute illness and continued diuretic use - received 1L of fluids; will hold off on further fluids and continue to monitor BMETs, as she is volume sensitive in the setting of combined systolic and diastolic heart failure  3. Chronic Charcot foot of right ankle. Seems to be stable, has stasis dermatitis and a large dry neuropathic wound at the base of the medial malleolus. She has been wheel chair bound for three years. Is planned for surgical correction as an outpatient next week. I don't think we need to change this plan right now.   4. DM type II  - 12 units Lantus daily (chart notes that she is taking 40 units instead of prescribed 12) - sensitive sliding scale with meals   DVT prophylaxis: heparin  Diet: heart healthy carb modified Code status: DNR   Dispo: Admit patient to Inpatient with expected length of stay greater than 2 midnights.  SignedBridget Hartshorn, DO 01/02/2018, 1:17 AM  Pager: Pager# [phone]    Internal Medicine Attending:   I saw and examined the patient. I reviewed the resident's note and  I agree with  the resident's findings and plan as documented in the resident's note. I have made the necessary changes to this note.   Erlinda Hong, MD

## 2018-01-01 NOTE — ED Provider Notes (Signed)
[facility] EMERGENCY DEPARTMENT Provider Note   CSN: [id number] Arrival date & time: 01/01/18  1723     History   Chief Complaint No chief complaint on file.   HPI Emily Miranda is a 77 y.o. female.  HPI  Patient with multiple medical issues presents with concern of increased pain, discoloration and drainage from her right ankle and foot. Patient has multiple medical issues including vasculopathy, and Charcot disease of the right ankle. She notes that over the past 2 days, possible week she has had increased erythema, drainage of malodorous material and pain from the ankle. She is unsure of objective fever, states that she feels generally weak, without focal weakness. Notably, the patient is scheduled to have amputation of the leg next week. She is on Keflex.    Past Medical History:  Diagnosis Date  . AKI (acute kidney injury) (HCC)   . Aortic stenosis, mild   . CAD (coronary artery disease)    a. 09/2016 NSTEMI/Cath: RCA 70p/m, RPDA 95ost-->Med Rx;  b. 10/2016 PCI: RCA 70p/m (2.25 x 15 Resolute Onyx DES), RPDA 95ost (2.0 x 12 Resolute Onyx DES).  . Charcot foot due to diabetes mellitus (HCC)   . Chronic combined systolic (congestive) and diastolic (congestive) heart failure (HCC)    a. 07/2016 Echo: EF 40-45%, Gr2 DD, mild AS, triv AI, mild MR, mod dil LA.  . Diabetic peripheral neuropathy (HCC)   . Heart murmur   . History of blood transfusion    "related to low HgB"  . History of kidney stones   . History of stomach ulcers   . HTN (hypertension)   . Hyperlipidemia   . Migraine    "maybe once/month" (08/28/2017)  . Myocardial infarction (HCC) 07/2017   while at [facility], s/p cath with DES LAD and CFX  . NSTEMI (non-ST elevated myocardial infarction) (HCC) 10/06/2016  . Pancreatitis    "my dad died w/it at 51 yr old"  . Physical deconditioning    a. W/C bound.  . Pneumonia 10/2016  . Stasis dermatitis of both legs 05/2017  . Type II diabetes  [facility])     Patient Active Problem List   Diagnosis Date Noted  . Palliative care by specialist   . Goals of care, counseling/discussion   . Acute pulmonary edema (HCC)   . DCM (dilated cardiomyopathy) (HCC)   . Nonrheumatic aortic valve stenosis   . Pressure injury of skin 10/05/2017  . CHF (congestive heart failure) (HCC) 10/04/2017  . Acute respiratory failure with hypoxia (HCC) 10/04/2017  . Acute on chronic combined systolic and diastolic HF (heart failure) (HCC) 08/29/2017  . Pre-operative clearance 08/28/2017  . Acute on chronic combined systolic and diastolic CHF (congestive heart failure) (HCC) 08/28/2017  . Cellulitis of lower extremity 05/29/2017  . Acute kidney injury (HCC) 05/29/2017  . Acute renal failure (ARF) (HCC) 05/29/2017  . Stasis dermatitis of both legs 05/29/2017  . Edema 11/24/2016  . Chronic combined systolic and diastolic heart failure (HCC) 11/09/2016  . Unstable angina (HCC) 11/04/2016  . Charcot foot due to diabetes mellitus (HCC) 10/31/2016  . Chronic anemia 10/31/2016  . Mixed hyperlipidemia   . PVD (peripheral vascular disease) (HCC)   . CAD S/P percutaneous coronary angioplasty   . Non-ST elevation (NSTEMI) myocardial infarction (HCC) 10/06/2016  . NSTEMI (non-ST elevated myocardial infarction) (HCC) 10/06/2016  . CKD (chronic kidney disease), stage III (HCC) 08/27/2016  . Chronic diabetic ulcer of right foot  08/27/2016  . Loss  of weight   . Acute combined systolic and diastolic congestive heart failure (HCC)   . Shortness of breath 07/29/2016  . Wound infection 11/13/2015  . Abdominal pain, RLQ (right lower quadrant) -chronic since 2009 06/02/2013  . Constipation, chronic 06/02/2013  . SUI (stress urinary incontinence, female) 06/02/2013  . Recurrent UTI (urinary tract infection) 06/02/2013  . Hypertensive cardiomyopathy, with heart failure (HCC)   . CHEST PAIN 11/27/2009  . OVERWEIGHT 11/28/2008  . DM type 2 causing CKD stage 3  (HCC) 11/25/2008  . Mild aortic stenosis 11/25/2008  . PANCREATITIS, HX OF 11/25/2008    Past Surgical History:  Procedure Laterality Date  . ABDOMINAL HYSTERECTOMY    . APPENDECTOMY    . CARPAL TUNNEL RELEASE Bilateral   . CATARACT EXTRACTION W/ INTRAOCULAR LENS  IMPLANT, BILATERAL Bilateral   . CORONARY ANGIOPLASTY WITH STENT PLACEMENT  07/2017   "@ [facility]"  . CORONARY STENT INTERVENTION N/A 11/05/2016   Procedure: Coronary Stent Intervention;  Surgeon: Lennette Bihari, MD;  Location: [facility] INVASIVE CV LAB;  Service: Cardiovascular;  Laterality: N/A;  . DILATION AND CURETTAGE OF UTERUS    . EXCISIONAL HEMORRHOIDECTOMY    . FOOT SURGERY Right    "ulcer"  . GLAUCOMA SURGERY Bilateral   . LAPAROSCOPIC INCISIONAL / UMBILICAL / VENTRAL HERNIA REPAIR  01/04/2008   Dr Bertram Savin  . LAPAROSCOPIC LYSIS OF ADHESIONS  2007   Dr Donovan Kail  . LEFT HEART CATH AND CORONARY ANGIOGRAPHY N/A 10/11/2016   Procedure: Left Heart Cath and Coronary Angiography;  Surgeon: Yvonne Kendall, MD;  Location: [facility] INVASIVE CV LAB;  Service: Cardiovascular;  Laterality: N/A;  . OVARIAN CYST REMOVAL    . RIGHT/LEFT HEART CATH AND CORONARY ANGIOGRAPHY N/A 10/09/2017   Procedure: RIGHT/LEFT HEART CATH AND CORONARY ANGIOGRAPHY;  Surgeon: Lyn Records, MD;  Location: [facility] INVASIVE CV LAB;  Service: Cardiovascular;  Laterality: N/A;  . ROBOT ASSISTED PYELOPLASTY  02/2007   Dr Laverle Patter  . SUTURE REMOVAL  08/17/2009   Dr Bertram Savin .  Right paramedian GoreTex stitch  . TONSILLECTOMY       OB History   None      Home Medications    Prior to Admission medications   Medication Sig Start Date End Date Taking? Authorizing Provider  Ascorbic Acid (VITAMIN C) 1000 MG tablet Take 1,000 mg by mouth daily.    [provider]  aspirin EC 81 MG tablet Take 1 tablet (81 mg total) by mouth daily. 09/04/17 09/04/18  Berton Bon, NP  atorvastatin (LIPITOR) 40 MG tablet Take 1 tablet (40 mg total) by mouth daily at 6 PM.  08/05/16   Garth Bigness, MD  carvedilol (COREG) 25 MG tablet Take 1 tablet (25 mg total) by mouth 2 (two) times daily with a meal. 10/18/17 11/17/17  Zigmund Daniel., MD  cephALEXin (KEFLEX) 500 MG capsule Take 1 capsule (500 mg total) by mouth 3 (three) times daily. 12/05/17   Asencion Islam, DPM  clopidogrel (PLAVIX) 75 MG tablet Take 1 tablet (75 mg total) by mouth daily with breakfast. 11/10/16   Creig Hines, NP  collagenase (SANTYL) ointment Apply topically daily. Apply to LE plantar surface daily. 06/04/17   Arrien, York Ram, MD  cyclobenzaprine (FLEXERIL) 10 MG tablet Take 10 mg by mouth 3 (three) times daily.  10/16/16   [provider]  diclofenac sodium (VOLTAREN) 1 % GEL Apply 2 g topically 4 (four) times daily. Please apply to let hip. 06/03/17  Arrien, York Ram, MD  fexofenadine (ALLEGRA) 180 MG tablet Take 180 mg by mouth daily.    [provider]  fluticasone (FLONASE) 50 MCG/ACT nasal spray Place 1 spray into both nostrils daily as needed (seasonal allergies).  02/05/16   [provider]  furosemide (LASIX) 40 MG tablet Take 1 tablet (40 mg total) by mouth daily. 09/05/17   Berton Bon, NP  glimepiride (AMARYL) 4 MG tablet Take 2-4 mg by mouth See admin instructions. Take 1 tablet by mouth every morning, and 1/2 tablet every evening 09/15/17   [provider]  HUMALOG KWIKPEN 100 UNIT/ML KiwkPen Inject 2 Units into the skin daily with supper. 08/26/17   [provider]  hydrALAZINE (APRESOLINE) 25 MG tablet Take 25 mg by mouth every 8 (eight) hours.    [provider]  HYDROcodone-acetaminophen (NORCO/VICODIN) 5-325 MG tablet Take 1 tablet by mouth every 6 (six) hours as needed for moderate pain. 06/03/17   Arrien, York Ram, MD  insulin glargine (LANTUS) 100 UNIT/ML injection Inject 0.12 mLs (12 Units total) into the skin at bedtime. Patient taking differently: Inject 40 Units into the skin at  bedtime.  09/04/17   Berton Bon, NP  isosorbide mononitrate (IMDUR) 30 MG 24 hr tablet Take 1 tablet (30 mg total) by mouth daily. 11/09/16   Creig Hines, NP  linagliptin (TRADJENTA) 5 MG TABS tablet Take 5 mg by mouth daily.    [provider]  mupirocin cream (BACTROBAN) 2 % Apply 1 application topically 2 (two) times daily. 05/23/17   Asencion Islam, DPM  nitroGLYCERIN (NITROSTAT) 0.4 MG SL tablet Place 1 tablet (0.4 mg total) under the tongue every 5 (five) minutes x 3 doses as needed for chest pain. 11/09/16   Creig Hines, NP  pantoprazole (PROTONIX) 40 MG tablet Take 1 tablet (40 mg total) by mouth daily. 07/04/17   Rollene Rotunda, MD  Polyethyl Glycol-Propyl Glycol (SYSTANE OP) Place 1 drop into both eyes daily as needed (dry eyes).    [provider]  polyethylene glycol (MIRALAX / GLYCOLAX) packet Take 17 g by mouth daily as needed (constipation). Mix in 8 oz liquid and drink    [provider]  silver sulfADIAZINE (SILVADENE) 1 % cream Apply topically daily. Apply to right lower extremities affected areas, not to the plantar region 06/04/17   Arrien, York Ram, MD    Family History Family History  Problem Relation Age of Onset  . Chronic Renal Failure Mother   . Diabetes Father   . Heart attack Father 58    Social History Social History   Tobacco Use  . Smoking status: Never Smoker  . Smokeless tobacco: Never Used  Substance Use Topics  . Alcohol use: No  . Drug use: No     Allergies   Levofloxacin; Amlodipine; Gabapentin; Hydrochlorothiazide; Ibuprofen; Metformin and related; and Simvastatin   Review of Systems Review of Systems  Constitutional:       Per HPI, otherwise negative  HENT:       Per HPI, otherwise negative  Respiratory:       Per HPI, otherwise negative  Cardiovascular:       Per HPI, otherwise negative  Gastrointestinal: Negative for vomiting.  Endocrine:       Negative aside from HPI   Genitourinary:       Neg aside from HPI   Musculoskeletal:       Per HPI, otherwise negative  Skin: Positive for color change and wound.  Neurological: Positive for weakness. Negative for syncope.     Physical Exam Updated Vital Signs BP 128/63   Pulse 97   Temp (!) 101 F (38.3 C) (Rectal)   SpO2 94%   Physical Exam  Constitutional: She is oriented to person, place, and time.  Uncomfortable appearing elderly F  HENT:  Head: Normocephalic and atraumatic.  Eyes: Conjunctivae and EOM are normal.  Cardiovascular: Normal rate and regular rhythm.  Pulmonary/Chest: Effort normal. Tachypnea noted. No respiratory distress.  Abdominal: She exhibits no distension.  Musculoskeletal: She exhibits no edema.       Feet:  Neurological: She is alert and oriented to person, place, and time. No cranial nerve deficit.  Skin: Skin is warm and dry.  Erythema erythema, sloughing, drainage of the right ankle  Psychiatric: She has a normal mood and affect.  Nursing note and vitals reviewed.    ED Treatments / Results  Labs (all labs ordered are listed, but only abnormal results are displayed) Labs Reviewed  COMPREHENSIVE METABOLIC PANEL - Abnormal; Notable for the following components:      Result Value   Potassium 5.6 (*)    Chloride 113 (*)    CO2 15 (*)    Glucose, Bld 302 (*)    BUN 89 (*)    Creatinine, Ser 2.60 (*)    Albumin 2.4 (*)    AST 14 (*)    GFR calc non Af Amer 17 (*)    GFR calc Af Amer 20 (*)    All other components within normal limits  CBC WITH DIFFERENTIAL/PLATELET - Abnormal; Notable for the following components:   WBC 15.7 (*)    RBC 3.54 (*)    Hemoglobin 9.0 (*)    HCT 30.4 (*)    MCH 25.4 (*)    MCHC 29.6 (*)    RDW 18.4 (*)    Neutro Abs 14.8 (*)    Lymphs Abs 0.5 (*)    All other components within normal limits  CBG MONITORING, ED - Abnormal; Notable for the following components:   Glucose-Capillary 276 (*)    All other components within normal  limits  I-STAT CG4 LACTIC ACID, ED  I-STAT CG4 LACTIC ACID, ED  TYPE AND SCREEN    EKG EKG Interpretation  Date/Time:  Thursday January 01 2018 20:53:23 EDT Ventricular Rate:  97 PR Interval:    QRS Duration: 95 QT Interval:  316 QTC Calculation: 402 R Axis:   44 Text Interpretation:  Sinus or ectopic atrial rhythm Prolonged PR interval Confirmed by Gerhard Munch [phone]) on 01/01/2018 8:57:55 PM   Procedures Procedures (including critical care time)  Medications Ordered in ED Medications  sodium chloride 0.9 % bolus 500 mL (500 mLs Intravenous New Bag/Given 01/01/18 1809)  vancomycin (VANCOCIN) IVPB 1000 mg/200 mL premix (0 mg Intravenous Stopped 01/01/18 1910)  fentaNYL (SUBLIMAZE) injection 25 mcg (25 mcg Intravenous Given 01/01/18 1946)     Initial Impression / Assessment and Plan / ED Course  I have reviewed the triage vital signs and the nursing notes.  Pertinent labs & imaging results that were available during my care of the patient were reviewed by me and considered in my medical decision making (see chart for details).     8:41 PM 8:41 PM Labs concerning with elevated leukocytosis, acute kidney injury Patient does not have lactic acidosis, but given these findings, clinical evidence for infection, the patient has received vancomycin, which was provided empirically on arrival. Patient also received IV narcotic for  pain medication given the ongoing drainage, from the wound.  With concern for cellulitis, and this vasculopath, she required admission for further evaluation and management.  Final Clinical Impressions(s) / ED Diagnoses  Cellulitis   Gerhard Munch, MD 01/01/18 [phone]

## 2018-01-01 NOTE — ED Triage Notes (Signed)
Pt arrived via [facility] from home c/o right leg infection.  Pt reports leg is to be amputated at [facility] next week.  Right leg has redness, swelling, and pain for the last 2 weeks.

## 2018-01-01 NOTE — ED Notes (Signed)
Patient refused to change into hospital gown. Pt is currently wearing personal gown from home.

## 2018-01-02 ENCOUNTER — Inpatient Hospital Stay (HOSPITAL_COMMUNITY): Payer: Medicare Other

## 2018-01-02 ENCOUNTER — Other Ambulatory Visit: Payer: Self-pay

## 2018-01-02 DIAGNOSIS — Z833 Family history of diabetes mellitus: Secondary | ICD-10-CM

## 2018-01-02 DIAGNOSIS — Z794 Long term (current) use of insulin: Secondary | ICD-10-CM

## 2018-01-02 DIAGNOSIS — Z888 Allergy status to other drugs, medicaments and biological substances status: Secondary | ICD-10-CM

## 2018-01-02 DIAGNOSIS — R509 Fever, unspecified: Secondary | ICD-10-CM

## 2018-01-02 DIAGNOSIS — M1612 Unilateral primary osteoarthritis, left hip: Secondary | ICD-10-CM

## 2018-01-02 DIAGNOSIS — N179 Acute kidney failure, unspecified: Secondary | ICD-10-CM

## 2018-01-02 DIAGNOSIS — M25452 Effusion, left hip: Secondary | ICD-10-CM

## 2018-01-02 DIAGNOSIS — Z881 Allergy status to other antibiotic agents status: Secondary | ICD-10-CM

## 2018-01-02 DIAGNOSIS — E785 Hyperlipidemia, unspecified: Secondary | ICD-10-CM

## 2018-01-02 DIAGNOSIS — I130 Hypertensive heart and chronic kidney disease with heart failure and stage 1 through stage 4 chronic kidney disease, or unspecified chronic kidney disease: Secondary | ICD-10-CM

## 2018-01-02 DIAGNOSIS — Z8249 Family history of ischemic heart disease and other diseases of the circulatory system: Secondary | ICD-10-CM

## 2018-01-02 DIAGNOSIS — Z886 Allergy status to analgesic agent status: Secondary | ICD-10-CM

## 2018-01-02 DIAGNOSIS — Z955 Presence of coronary angioplasty implant and graft: Secondary | ICD-10-CM

## 2018-01-02 DIAGNOSIS — I872 Venous insufficiency (chronic) (peripheral): Secondary | ICD-10-CM

## 2018-01-02 DIAGNOSIS — Z79899 Other long term (current) drug therapy: Secondary | ICD-10-CM

## 2018-01-02 DIAGNOSIS — I2510 Atherosclerotic heart disease of native coronary artery without angina pectoris: Secondary | ICD-10-CM

## 2018-01-02 DIAGNOSIS — L97419 Non-pressure chronic ulcer of right heel and midfoot with unspecified severity: Secondary | ICD-10-CM

## 2018-01-02 DIAGNOSIS — I440 Atrioventricular block, first degree: Secondary | ICD-10-CM

## 2018-01-02 DIAGNOSIS — Z66 Do not resuscitate: Secondary | ICD-10-CM

## 2018-01-02 DIAGNOSIS — E11621 Type 2 diabetes mellitus with foot ulcer: Secondary | ICD-10-CM

## 2018-01-02 LAB — CBC
HCT: 27.5 % — ABNORMAL LOW (ref 36.0–46.0)
Hemoglobin: 8.2 g/dL — ABNORMAL LOW (ref 12.0–15.0)
MCH: 25.2 pg — ABNORMAL LOW (ref 26.0–34.0)
MCHC: 29.8 g/dL — ABNORMAL LOW (ref 30.0–36.0)
MCV: 84.6 fL (ref 78.0–100.0)
Platelets: 258 10*3/uL (ref 150–400)
RBC: 3.25 MIL/uL — ABNORMAL LOW (ref 3.87–5.11)
RDW: 18.5 % — ABNORMAL HIGH (ref 11.5–15.5)
WBC: 17.5 10*3/uL — ABNORMAL HIGH (ref 4.0–10.5)

## 2018-01-02 LAB — SYNOVIAL CELL COUNT + DIFF, W/ CRYSTALS
Crystals, Fluid: NONE SEEN
Eosinophils-Synovial: 0 % (ref 0–1)
Lymphocytes-Synovial Fld: 19 % (ref 0–20)
Monocyte-Macrophage-Synovial Fluid: 14 % — ABNORMAL LOW (ref 50–90)
Neutrophil, Synovial: 67 % — ABNORMAL HIGH (ref 0–25)
Other Cells-SYN: 0
WBC, Synovial: 11 /mm3 (ref 0–200)

## 2018-01-02 LAB — BASIC METABOLIC PANEL
Anion gap: 6 (ref 5–15)
BUN: 80 mg/dL — ABNORMAL HIGH (ref 8–23)
CO2: 13 mmol/L — ABNORMAL LOW (ref 22–32)
Calcium: 8.6 mg/dL — ABNORMAL LOW (ref 8.9–10.3)
Chloride: 117 mmol/L — ABNORMAL HIGH (ref 98–111)
Creatinine, Ser: 2.32 mg/dL — ABNORMAL HIGH (ref 0.44–1.00)
GFR calc Af Amer: 23 mL/min — ABNORMAL LOW (ref >60)
GFR calc non Af Amer: 19 mL/min — ABNORMAL LOW (ref >60)
Glucose, Bld: 267 mg/dL — ABNORMAL HIGH (ref 70–99)
Potassium: 4.9 mmol/L (ref 3.5–5.1)
Sodium: 136 mmol/L (ref 135–145)

## 2018-01-02 LAB — URINALYSIS, ROUTINE W REFLEX MICROSCOPIC
Bilirubin Urine: NEGATIVE
Glucose, UA: 50 mg/dL — AB
Hgb urine dipstick: NEGATIVE
Ketones, ur: NEGATIVE mg/dL
Leukocytes, UA: NEGATIVE
Nitrite: NEGATIVE
Protein, ur: 100 mg/dL — AB
Specific Gravity, Urine: 1.014 (ref 1.005–1.030)
pH: 5 (ref 5.0–8.0)

## 2018-01-02 LAB — CK: Total CK: 156 U/L (ref 38–234)

## 2018-01-02 LAB — GLUCOSE, CAPILLARY
Glucose-Capillary: 240 mg/dL — ABNORMAL HIGH (ref 70–99)
Glucose-Capillary: 176 mg/dL — ABNORMAL HIGH (ref 70–99)
Glucose-Capillary: 180 mg/dL — ABNORMAL HIGH (ref 70–99)
Glucose-Capillary: 299 mg/dL — ABNORMAL HIGH (ref 70–99)
Glucose-Capillary: 180 mg/dL — ABNORMAL HIGH (ref 70–99)

## 2018-01-02 MED ORDER — INSULIN GLARGINE 100 UNIT/ML ~~LOC~~ SOLN
12.0000 [IU] | Freq: Every day | SUBCUTANEOUS | Status: DC
  Administered 2018-01-02 – 2018-01-05 (×5): 12 [IU] via SUBCUTANEOUS
  Filled 2018-01-02 (×6): qty 0.12

## 2018-01-02 MED ORDER — CLOPIDOGREL BISULFATE 75 MG PO TABS
75.0000 mg | ORAL_TABLET | Freq: Every day | ORAL | Status: DC
  Administered 2018-01-02 – 2018-01-04 (×3): 75 mg via ORAL
  Filled 2018-01-02 (×3): qty 1

## 2018-01-02 MED ORDER — HYDRALAZINE HCL 25 MG PO TABS
25.0000 mg | ORAL_TABLET | Freq: Three times a day (TID) | ORAL | Status: DC
  Administered 2018-01-02 – 2018-01-06 (×13): 25 mg via ORAL
  Filled 2018-01-02 (×13): qty 1

## 2018-01-02 MED ORDER — PANTOPRAZOLE SODIUM 40 MG PO TBEC
40.0000 mg | DELAYED_RELEASE_TABLET | Freq: Every day | ORAL | Status: DC
  Administered 2018-01-02 – 2018-01-06 (×5): 40 mg via ORAL
  Filled 2018-01-02 (×5): qty 1

## 2018-01-02 MED ORDER — INSULIN ASPART 100 UNIT/ML ~~LOC~~ SOLN
0.0000 [IU] | Freq: Every day | SUBCUTANEOUS | Status: DC
  Administered 2018-01-02: 2 [IU] via SUBCUTANEOUS
  Administered 2018-01-05: 4 [IU] via SUBCUTANEOUS

## 2018-01-02 MED ORDER — HYDROCODONE-ACETAMINOPHEN 5-325 MG PO TABS
1.0000 | ORAL_TABLET | ORAL | Status: DC | PRN
  Administered 2018-01-02 – 2018-01-04 (×8): 2 via ORAL
  Filled 2018-01-02 (×9): qty 2

## 2018-01-02 MED ORDER — ISOSORBIDE MONONITRATE ER 30 MG PO TB24
30.0000 mg | ORAL_TABLET | Freq: Every day | ORAL | Status: DC
  Administered 2018-01-02 – 2018-01-06 (×5): 30 mg via ORAL
  Filled 2018-01-02 (×5): qty 1

## 2018-01-02 MED ORDER — CARVEDILOL 12.5 MG PO TABS
25.0000 mg | ORAL_TABLET | Freq: Two times a day (BID) | ORAL | Status: DC
  Administered 2018-01-02 – 2018-01-06 (×8): 25 mg via ORAL
  Filled 2018-01-02 (×8): qty 2

## 2018-01-02 MED ORDER — VANCOMYCIN HCL IN DEXTROSE 1-5 GM/200ML-% IV SOLN: 1000.0000 mg | INTRAVENOUS | Status: DC

## 2018-01-02 MED ORDER — INSULIN ASPART 100 UNIT/ML ~~LOC~~ SOLN
0.0000 [IU] | Freq: Three times a day (TID) | SUBCUTANEOUS | Status: DC
  Administered 2018-01-02: 2 [IU] via SUBCUTANEOUS
  Administered 2018-01-02: 5 [IU] via SUBCUTANEOUS
  Administered 2018-01-02 – 2018-01-03 (×3): 2 [IU] via SUBCUTANEOUS
  Administered 2018-01-04: 3 [IU] via SUBCUTANEOUS
  Administered 2018-01-04: 2 [IU] via SUBCUTANEOUS
  Administered 2018-01-04: 3 [IU] via SUBCUTANEOUS
  Administered 2018-01-05: 5 [IU] via SUBCUTANEOUS
  Administered 2018-01-05 (×2): 3 [IU] via SUBCUTANEOUS
  Administered 2018-01-06: 2 [IU] via SUBCUTANEOUS
  Administered 2018-01-06: 3 [IU] via SUBCUTANEOUS

## 2018-01-02 MED ORDER — HYDROCODONE-ACETAMINOPHEN 5-325 MG PO TABS
1.0000 | ORAL_TABLET | ORAL | Status: DC | PRN
  Administered 2018-01-02: 1 via ORAL
  Filled 2018-01-02: qty 1

## 2018-01-02 MED ORDER — HEPARIN SODIUM (PORCINE) 5000 UNIT/ML IJ SOLN: 5000.0000 [IU] | Freq: Three times a day (TID) | INTRAMUSCULAR | Status: DC

## 2018-01-02 MED ORDER — VANCOMYCIN HCL IN DEXTROSE 1-5 GM/200ML-% IV SOLN
1000.0000 mg | INTRAVENOUS | Status: DC
  Administered 2018-01-03: 1000 mg via INTRAVENOUS
  Filled 2018-01-02: qty 200

## 2018-01-02 MED ORDER — HYDROCODONE-ACETAMINOPHEN 5-325 MG PO TABS
1.0000 | ORAL_TABLET | Freq: Four times a day (QID) | ORAL | Status: DC | PRN
  Administered 2018-01-02: 1 via ORAL
  Filled 2018-01-02: qty 1

## 2018-01-02 MED ORDER — LIDOCAINE HCL (PF) 1 % IJ SOLN
INTRAMUSCULAR | Status: AC
  Filled 2018-01-02: qty 10

## 2018-01-02 MED ORDER — ATORVASTATIN CALCIUM 40 MG PO TABS
40.0000 mg | ORAL_TABLET | Freq: Every day | ORAL | Status: DC
  Administered 2018-01-03: 40 mg via ORAL
  Filled 2018-01-02 (×2): qty 1

## 2018-01-02 MED ORDER — HYDROMORPHONE HCL 1 MG/ML IJ SOLN
0.5000 mg | INTRAMUSCULAR | Status: DC | PRN
  Administered 2018-01-02 – 2018-01-03 (×4): 0.5 mg via INTRAVENOUS
  Filled 2018-01-02 (×4): qty 0.5

## 2018-01-02 MED ORDER — SODIUM CHLORIDE 0.9 % IV SOLN
1.0000 g | INTRAVENOUS | Status: DC
  Administered 2018-01-02 – 2018-01-04 (×3): 1 g via INTRAVENOUS
  Filled 2018-01-02 (×3): qty 10

## 2018-01-02 NOTE — Progress Notes (Addendum)
Pharmacy Antibiotic Note  Emily Miranda is a 77 y.o. female admitted on 01/01/2018 with septic joint.  Pharmacy has been consulted for vancomycin dosing.  Presented with progressive L hip/flank pain x1 week. WBC 15.7>17.5. Scr 2.32 (CrCl ~19 mL/min), uop 300 mL/24 hrs. S/p hip aspiration 8/23. MRI pending. On concurrent ceftriaxone. Afebrile. Received 1g IV vancomycin on 8/22@1810 .  Plan: Vancomycin 1000 mg IV every 48 hours.  Goal trough 15-20 mcg/mL.  Continue ceftriaxone 1 g IV every 24 hours Monitor renal function, cx results, clinical pic, and VT as indicated  Height: 5\' 6"  (167.6 cm) Weight: 143 lb 15.4 oz (65.3 kg) IBW/kg (Calculated) : 59.3  Temp (24hrs), Avg:98.7 F (37.1 C), Min:98 F (36.7 C), Max:99.3 F (37.4 C)  Recent Labs  Lab 01/01/18 1725 01/01/18 1749 01/01/18 2151 01/02/18 0102  WBC 15.7*  --   --  17.5*  CREATININE 2.60*  --   --  2.32*  LATICACIDVEN  --  0.73 0.77  --     Estimated Creatinine Clearance: 19.6 mL/min (A) (by C-G formula based on SCr of 2.32 mg/dL (H)).    Allergies  Allergen Reactions  . Levofloxacin Nausea And Vomiting and Other (See Comments)    Confusion  . Amlodipine Other (See Comments)    Localized edema  . Gabapentin Other (See Comments)    Disoriented, no strength in legs  . Hydrochlorothiazide Other (See Comments)    Hot and disoriented  . Ibuprofen Other (See Comments)    Affected kidneys - stopped flow of urine Reaction to Advil  . Metformin And Related Other (See Comments)    Affected kidneys - stopped flow of urine  . Simvastatin Other (See Comments)    Chest pain    Antimicrobials this admission: Vancomycin 8/22 >>  Ceftriaxone 8/23 >>   Dose adjustments this admission: N/A  Microbiology results: 8/22 BCx: NGTD 8/23 UCx: sent   Thank you for allowing pharmacy to be a part of this patient's care.  Girard Cooter, PharmD Clinical Pharmacist  Pager: [phone] Phone: [phone] 01/02/2018 9:15 PM

## 2018-01-02 NOTE — Consult Note (Signed)
Reason for Consult:Left hip pain Referring Physician: Sherlie Boyum is an 77 y.o. female.  HPI: Emily Miranda was admitted yesterday with progressive left hip/flank pain. She says it's been going on for about a week and getting worse. She denies any prior hx/o similar. She has had some subjective low-grade fevers as well. Denies N/T. Has been able to ambulate.  Past Medical History:  Diagnosis Date  . AKI (acute kidney injury) ([place])   . Aortic stenosis, mild   . CAD (coronary artery disease)    a. 09/2016 NSTEMI/Cath: RCA 70p/m, RPDA 95ost-->Med Rx;  b. 10/2016 PCI: RCA 70p/m (2.25 x 15 Resolute Onyx DES), RPDA 95ost (2.0 x 12 Resolute Onyx DES).  . Charcot foot due to diabetes mellitus ([place])   . Chronic combined systolic (congestive) and diastolic (congestive) heart failure ([place])    a. 07/2016 Echo: EF 40-45%, Gr2 DD, mild AS, triv AI, mild MR, mod dil LA.  . Diabetic peripheral neuropathy ([place])   . Heart murmur   . History of blood transfusion    "related to low HgB"  . History of kidney stones   . History of stomach ulcers   . HTN (hypertension)   . Hyperlipidemia   . Migraine    "maybe once/month" (08/28/2017)  . Myocardial infarction ([place]) 07/2017   while at [facility], s/p cath with DES LAD and CFX  . NSTEMI (non-ST elevated myocardial infarction) ([place]) 10/06/2016  . Pancreatitis    "my dad died w/it at 31 yr old"  . Physical deconditioning    a. W/C bound.  . Pneumonia 10/2016  . Stasis dermatitis of both legs 05/2017  . Type II diabetes mellitus ([place])     Past Surgical History:  Procedure Laterality Date  . ABDOMINAL HYSTERECTOMY    . APPENDECTOMY    . CARPAL TUNNEL RELEASE Bilateral   . CATARACT EXTRACTION W/ INTRAOCULAR LENS  IMPLANT, BILATERAL Bilateral   . CORONARY ANGIOPLASTY WITH STENT PLACEMENT  07/2017   "@ [facility]"  . CORONARY STENT INTERVENTION N/A 11/05/2016   Procedure: Coronary Stent Intervention;  Surgeon: Troy Sine, MD;  Location: [place] [facility];  Service: Cardiovascular;  Laterality: N/A;  . DILATION AND CURETTAGE OF UTERUS    . EXCISIONAL HEMORRHOIDECTOMY    . FOOT SURGERY Right    "ulcer"  . GLAUCOMA SURGERY Bilateral   . LAPAROSCOPIC INCISIONAL / UMBILICAL / VENTRAL HERNIA REPAIR  01/04/2008   Dr Ronnald Collum  . LAPAROSCOPIC LYSIS OF ADHESIONS  2007   Dr Harle Battiest  . LEFT HEART CATH AND CORONARY ANGIOGRAPHY N/A 10/11/2016   Procedure: Left Heart Cath and Coronary Angiography;  Surgeon: Nelva Bush, MD;  Location: [place];  Service: Cardiovascular;  Laterality: N/A;  . OVARIAN CYST REMOVAL    . RIGHT/LEFT HEART CATH AND CORONARY ANGIOGRAPHY N/A 10/09/2017   Procedure: RIGHT/LEFT HEART CATH AND CORONARY ANGIOGRAPHY;  Surgeon: Belva Crome, MD;  Location: [facility];  Service: Cardiovascular;  Laterality: N/A;  . ROBOT ASSISTED PYELOPLASTY  02/2007   Dr Alinda Money  . SUTURE REMOVAL  08/17/2009   Dr Ronnald Collum .  Right paramedian GoreTex stitch  . TONSILLECTOMY      Family History  Problem Relation Age of Onset  . Chronic Renal Failure Mother   . Diabetes Father   . Heart attack Father 68    Social History:  reports that she has never smoked. She has never used smokeless tobacco. She reports that she  does not drink alcohol or use drugs.  Allergies:  Allergies  Allergen Reactions  . Levofloxacin Nausea And Vomiting and Other (See Comments)    Confusion  . Amlodipine Other (See Comments)    Localized edema  . Gabapentin Other (See Comments)    Disoriented, no strength in legs  . Hydrochlorothiazide Other (See Comments)    Hot and disoriented  . Ibuprofen Other (See Comments)    Affected kidneys - stopped flow of urine Reaction to Advil  . Metformin And Related Other (See Comments)    Affected kidneys - stopped flow of urine  . Simvastatin Other (See Comments)    Chest pain    Medications: I have reviewed the patient's current medications.  Results for orders placed or performed during  the hospital encounter of 01/01/18 (from the past 48 hour(s))  CBG monitoring, ED     Status: Abnormal   Collection Time: 01/01/18  5:25 PM  Result Value Ref Range   Glucose-Capillary 276 (H) 70 - 99 mg/dL  Type and screen [place]     Status: None   Collection Time: 01/01/18  5:25 PM  Result Value Ref Range   ABO/RH(D) O POS    Antibody Screen NEG    Sample Expiration      01/04/2018 Performed at [facility], [place] [address], [place], [place] [address]   Comprehensive metabolic panel     Status: Abnormal   Collection Time: 01/01/18  5:25 PM  Result Value Ref Range   Sodium 136 135 - 145 mmol/L   Potassium 5.6 (H) 3.5 - 5.1 mmol/L   Chloride 113 (H) 98 - 111 mmol/L   CO2 15 (L) 22 - 32 mmol/L   Glucose, Bld 302 (H) 70 - 99 mg/dL   BUN 89 (H) 8 - 23 mg/dL   Creatinine, Ser 2.60 (H) 0.44 - 1.00 mg/dL   Calcium 9.0 8.9 - 10.3 mg/dL   Total Protein 7.0 6.5 - 8.1 g/dL   Albumin 2.4 (L) 3.5 - 5.0 g/dL   AST 14 (L) 15 - 41 U/L   ALT 17 0 - 44 U/L   Alkaline Phosphatase 114 38 - 126 U/L   Total Bilirubin 0.4 0.3 - 1.2 mg/dL   GFR calc non Af Amer 17 (L) >60 mL/min   GFR calc Af Amer 20 (L) >60 mL/min    Comment: (NOTE) The eGFR has been calculated using the CKD EPI equation. This calculation has not been validated in all clinical situations. eGFR's persistently <60 mL/min signify possible Chronic Kidney Disease.    Anion gap 8 5 - 15    Comment: Performed at [facility] [address] [place], [place] [address]  CBC with Differential     Status: Abnormal   Collection Time: 01/01/18  5:25 PM  Result Value Ref Range   WBC 15.7 (H) 4.0 - 10.5 K/uL   RBC 3.54 (L) 3.87 - 5.11 MIL/uL   Hemoglobin 9.0 (L) 12.0 - 15.0 g/dL   HCT 30.4 (L) 36.0 - 46.0 %   MCV 85.9 78.0 - 100.0 fL   MCH 25.4 (L) 26.0 - 34.0 pg   MCHC 29.6 (L) 30.0 - 36.0 g/dL   RDW 18.4 (H) 11.5 - 15.5 %   Platelets 295 150 - 400 K/uL   Neutrophils Relative % 94 %   Neutro Abs  14.8 (H) 1.7 - 7.7 K/uL   Lymphocytes Relative 3 %   Lymphs Abs 0.5 (L) 0.7 -  4.0 K/uL   Monocytes Relative 2 %   Monocytes Absolute 0.2 0.1 - 1.0 K/uL   Eosinophils Relative 0 %   Eosinophils Absolute 0.1 0.0 - 0.7 K/uL   Basophils Relative 0 %   Basophils Absolute 0.0 0.0 - 0.1 K/uL   Immature Granulocytes 1 %   Abs Immature Granulocytes 0.1 0.0 - 0.1 K/uL    Comment: Performed at [facility] [address], [place], [place] [address]  Blood culture (routine x 2)     Status: None (Preliminary result)   Collection Time: 01/01/18  5:25 PM  Result Value Ref Range   Specimen Description BLOOD RIGHT ANTECUBITAL    Special Requests      BOTTLES DRAWN AEROBIC AND ANAEROBIC Blood Culture adequate volume   Culture      NO GROWTH < 12 HOURS Performed at [facility] [address], [place], [place] [address]    Report Status PENDING   I-Stat CG4 Lactic Acid, ED     Status: None   Collection Time: 01/01/18  5:49 PM  Result Value Ref Range   Lactic Acid, Venous 0.73 0.5 - 1.9 mmol/L  I-Stat CG4 Lactic Acid, ED     Status: None   Collection Time: 01/01/18  9:51 PM  Result Value Ref Range   Lactic Acid, Venous 0.77 0.5 - 1.9 mmol/L  Urinalysis, Routine w reflex microscopic     Status: Abnormal   Collection Time: 01/02/18 12:57 AM  Result Value Ref Range   Color, Urine YELLOW YELLOW   APPearance HAZY (A) CLEAR   Specific Gravity, Urine 1.014 1.005 - 1.030   pH 5.0 5.0 - 8.0   Glucose, UA 50 (A) NEGATIVE mg/dL   Hgb urine dipstick NEGATIVE NEGATIVE   Bilirubin Urine NEGATIVE NEGATIVE   Ketones, ur NEGATIVE NEGATIVE mg/dL   Protein, ur 100 (A) NEGATIVE mg/dL   Nitrite NEGATIVE NEGATIVE   Leukocytes, UA NEGATIVE NEGATIVE   RBC / HPF 0-5 0 - 5 RBC/hpf   WBC, UA 0-5 0 - 5 WBC/hpf   Bacteria, UA RARE (A) NONE SEEN   Squamous Epithelial / LPF 0-5 0 - 5    Comment: Performed at [facility], [address], [place], [place] [address]  CBC     Status: Abnormal    Collection Time: 01/02/18  1:02 AM  Result Value Ref Range   WBC 17.5 (H) 4.0 - 10.5 K/uL   RBC 3.25 (L) 3.87 - 5.11 MIL/uL   Hemoglobin 8.2 (L) 12.0 - 15.0 g/dL   HCT 27.5 (L) 36.0 - 46.0 %   MCV 84.6 78.0 - 100.0 fL   MCH 25.2 (L) 26.0 - 34.0 pg   MCHC 29.8 (L) 30.0 - 36.0 g/dL   RDW 18.5 (H) 11.5 - 15.5 %   Platelets 258 150 - 400 K/uL    Comment: Performed at [facility], [place] [address], [place], [place] [address]  Basic metabolic panel     Status: Abnormal   Collection Time: 01/02/18  1:02 AM  Result Value Ref Range   Sodium 136 135 - 145 mmol/L   Potassium 4.9 3.5 - 5.1 mmol/L   Chloride 117 (H) 98 - 111 mmol/L   CO2 13 (L) 22 - 32 mmol/L   Glucose, Bld 267 (H) 70 - 99 mg/dL   BUN 80 (H) 8 - 23 mg/dL   Creatinine, Ser 2.32 (H) 0.44 - 1.00 mg/dL   Calcium 8.6 (L) 8.9 -  10.3 mg/dL   GFR calc non Af Amer 19 (L) >60 mL/min   GFR calc Af Amer 23 (L) >60 mL/min    Comment: (NOTE) The eGFR has been calculated using the CKD EPI equation. This calculation has not been validated in all clinical situations. eGFR's persistently <60 mL/min signify possible Chronic Kidney Disease.    Anion gap 6 5 - 15    Comment: Performed at [facility] [address], [place], [place] [address]  Glucose, capillary     Status: Abnormal   Collection Time: 01/02/18  1:16 AM  Result Value Ref Range   Glucose-Capillary 240 (H) 70 - 99 mg/dL  Glucose, capillary     Status: Abnormal   Collection Time: 01/02/18  6:14 AM  Result Value Ref Range   Glucose-Capillary 176 (H) 70 - 99 mg/dL   Comment 1 Notify RN    Comment 2 Document in Chart   Glucose, capillary     Status: Abnormal   Collection Time: 01/02/18 10:44 AM  Result Value Ref Range   Glucose-Capillary 180 (H) 70 - 99 mg/dL    US Abdomen Complete  Result Date: 01/02/2018 CLINICAL DATA:  Left upper quadrant pain.  Acute kidney injury. EXAM: ABDOMEN ULTRASOUND COMPLETE COMPARISON:  Renal ultrasound 10/09/2017.  Abdominal CT  12/02/2016 FINDINGS: Gallbladder: Physiologically distended containing layering gallstones. No gallbladder wall thickening or pericholecystic fluid. No sonographic Murphy sign noted by sonographer. Common bile duct: Diameter: 4-5 mm, normal. Liver: No focal lesion identified. Within normal limits in parenchymal echogenicity. Portal vein is patent on color Doppler imaging with normal direction of blood flow towards the liver. IVC: No abnormality visualized. Pancreas: Not well visualized. Spleen: Size and appearance within normal limits. Right Kidney: Length: 11.7 cm. Minimal perinephric fluid, likely chronic and seen on prior abdominal CT. Echogenicity within normal limits. No mass or hydronephrosis visualized. Left Kidney: Length: 11.0 cm. Lobular contours. 4 mm shadowing calculus in the lateral kidney. 1.8 cm cyst in the mid kidney. Echogenicity within normal limits. No solid mass or hydronephrosis visualized. Abdominal aorta: No aneurysm visualized. Other findings: Urinary bladder is physiologically distended without wall thickening. IMPRESSION: 1. Gallstones without sonographic findings of acute cholecystitis. 2. Nonobstructing left renal stone. Electronically Signed   By: Jeb Levering M.D.   On: 01/02/2018 05:07   Dg Hip Unilat With Pelvis 1v Left  Result Date: 01/02/2018 CLINICAL DATA:  Pain for several days EXAM: DG HIP (WITH OR WITHOUT PELVIS) 2+ V COMPARISON:  May 29, 2017 FINDINGS: Frontal pelvis as well as frontal and lateral left hip images were obtained. No fracture or dislocation. There is moderate narrowing of each hip joint. No erosive change. There is periarticular osteoporosis. There is mesh in lower abdomen and pelvic regions. IMPRESSION: No demonstrable fracture or dislocation. Moderate symmetric narrowing both hip joints. Periarticular osteoporosis bilaterally noted. Electronically Signed   By: Lowella Grip III M.D.   On: 01/02/2018 09:16    Review of Systems  Constitutional:  Negative for weight loss.  HENT: Negative for ear discharge, ear pain, hearing loss and tinnitus.   Eyes: Negative for blurred vision, double vision, photophobia and pain.  Respiratory: Negative for cough, sputum production and shortness of breath.   Cardiovascular: Negative for chest pain.  Gastrointestinal: Negative for abdominal pain, nausea and vomiting.  Genitourinary: Negative for dysuria, flank pain, frequency and urgency.  Musculoskeletal: Positive for joint pain (Left hip). Negative for back pain, falls, myalgias and neck pain.  Neurological: Negative for dizziness, tingling,  sensory change, focal weakness, loss of consciousness and headaches.  Endo/Heme/Allergies: Does not bruise/bleed easily.  Psychiatric/Behavioral: Negative for depression, memory loss and substance abuse. The patient is not nervous/anxious.    Blood pressure 121/62, pulse 75, temperature 98.7 F (37.1 C), temperature source Oral, resp. rate 18, height '5\' 6"'$  (1.676 m), weight 65.3 kg, SpO2 94 %. Physical Exam  Constitutional: She appears well-developed and well-nourished. No distress.  HENT:  Head: Normocephalic and atraumatic.  Eyes: Conjunctivae are normal. Right eye exhibits no discharge. Left eye exhibits no discharge. No scleral icterus.  Neck: Normal range of motion.  Cardiovascular: Normal rate and regular rhythm.  Respiratory: Effort normal. No respiratory distress.  Musculoskeletal:  LLE No traumatic wounds, ecchymosis, or rash  TTP pelvis/lateral thigh, no erythema, mild pain with AROM/PROM  No knee or ankle effusion  Knee stable to varus/ valgus and anterior/posterior stress  Sens DPN, SPN, TN intact  Motor EHL, ext, flex, evers 5/5  DP 1+, PT 1+, No significant edema  Neurological: She is alert.  Skin: Skin is warm and dry. She is not diaphoretic.  Psychiatric: She has a normal mood and affect. Her behavior is normal.    Assessment/Plan: Left hip pain -- Doubt septic joint given superficial  TTP and only mildly tender ROM. However, please keep NPO until hip can be aspirated by IR. Dr. Stann Mainland to evaluate patient, will await lab/radiology results.    Lisette Abu, PA-C Orthopedic Surgery [phone] 01/02/2018, 3:50 PM

## 2018-01-02 NOTE — Discharge Summary (Signed)
Name: Emily Miranda MRN: [id number] DOB: 09-12-1940 77 y.o. PCP: Noni Saupe, MD  Date of Admission: 01/01/2018  5:23 PM Date of Discharge:  01/06/2018  3:16 PM Attending Physician: Tyson Alias, *  Discharge Diagnosis: 1. Hip pain 2. NSTEMI 3. Cellulitis 4. Anemia   Discharge Medications: Allergies as of 01/06/2018      Reactions   Levofloxacin Nausea And Vomiting, Other (See Comments)   Confusion   Amlodipine Other (See Comments)   Localized edema   Gabapentin Other (See Comments)   Disoriented, no strength in legs   Hydrochlorothiazide Other (See Comments)   Hot and disoriented   Ibuprofen Other (See Comments)   Affected kidneys - stopped flow of urine Reaction to Advil   Metformin And Related Other (See Comments)   Affected kidneys - stopped flow of urine   Simvastatin Other (See Comments)   Chest pain      Medication List    STOP taking these medications   atorvastatin 40 MG tablet Commonly known as:  LIPITOR   cephALEXin 500 MG capsule Commonly known as:  KEFLEX   HYDROcodone-acetaminophen 5-325 MG tablet Commonly known as:  NORCO/VICODIN     TAKE these medications   aspirin EC 81 MG tablet Take 1 tablet (81 mg total) by mouth daily.   carvedilol 25 MG tablet Commonly known as:  COREG Take 1 tablet (25 mg total) by mouth 2 (two) times daily with a meal.   clopidogrel 75 MG tablet Commonly known as:  PLAVIX Take 1 tablet (75 mg total) by mouth daily with breakfast.   collagenase ointment Commonly known as:  SANTYL Apply topically daily. Apply to LE plantar surface daily. What changed:    how much to take  when to take this   cyclobenzaprine 10 MG tablet Commonly known as:  FLEXERIL Take 10 mg by mouth 3 (three) times daily.   diclofenac sodium 1 % Gel Commonly known as:  VOLTAREN Apply 2 g topically 4 (four) times daily. Please apply to let hip. What changed:  additional instructions   fexofenadine 180 MG  tablet Commonly known as:  ALLEGRA Take 180 mg by mouth daily.   fluticasone 50 MCG/ACT nasal spray Commonly known as:  FLONASE Place 1 spray into both nostrils daily as needed (seasonal allergies).   furosemide 40 MG tablet Commonly known as:  LASIX Take 1 tablet (40 mg total) by mouth daily.   glimepiride 4 MG tablet Commonly known as:  AMARYL Take 2-4 mg by mouth See admin instructions. Take 1 tablet by mouth every morning, and 1/2 tablet every evening   HUMALOG KWIKPEN 100 UNIT/ML KiwkPen Generic drug:  insulin lispro Inject 2 Units into the skin daily with supper.   hydrALAZINE 25 MG tablet Commonly known as:  APRESOLINE Take 25 mg by mouth every 8 (eight) hours.   insulin glargine 100 UNIT/ML injection Commonly known as:  LANTUS Inject 0.12 mLs (12 Units total) into the skin at bedtime.   isosorbide mononitrate 30 MG 24 hr tablet Commonly known as:  IMDUR Take 1 tablet (30 mg total) by mouth daily.   linagliptin 5 MG Tabs tablet Commonly known as:  TRADJENTA Take 5 mg by mouth daily.   mupirocin cream 2 % Commonly known as:  BACTROBAN Apply 1 application topically 2 (two) times daily.   nitroGLYCERIN 0.4 MG SL tablet Commonly known as:  NITROSTAT Place 1 tablet (0.4 mg total) under the tongue every 5 (five) minutes x 3 doses  as needed for chest pain.   pantoprazole 40 MG tablet Commonly known as:  PROTONIX Take 1 tablet (40 mg total) by mouth daily.   polyethylene glycol packet Commonly known as:  MIRALAX / GLYCOLAX Take 17 g by mouth daily as needed (constipation). Mix in 8 oz liquid and drink   rosuvastatin 20 MG tablet Commonly known as:  CRESTOR Take 1 tablet (20 mg total) by mouth at bedtime.   silver sulfADIAZINE 1 % cream Commonly known as:  SILVADENE Apply topically daily. Apply to right lower extremities affected areas, not to the plantar region   SYSTANE OP Place 1 drop into both eyes daily as needed (dry eyes).   vitamin C 1000 MG  tablet Take 1,000 mg by mouth daily.       Disposition and follow-up:   Emily Miranda was discharged from [facility] in Stable condition.  At the hospital follow up visit please address:  1.  Patient had an NSTEMI while in the hospital, she was medically managed, she should be on ASA and clopidogrel and have a follow up with cardiology.   She also had hip pain, septic joint and fracture was ruled out,   2.  Labs / imaging needed at time of follow-up: None  3.  Pending labs/ test needing follow-up: None  Follow-up Appointments: Follow-up Information    Noni Saupe, MD. Schedule an appointment as soon as possible for a visit in 1 week(s).   Specialty:  Family Medicine Contact information: [address] [place] [address] [phone]        Rollene Rotunda, MD .   Specialty:  Cardiology Contact information: [address] [place] [address] [phone]           Hospital Course by problem list: 1. Hip pain: Patient presented with a 2 week history of left hip pain, was sharp in nature, radiated to her groin area and mid-abdomen. She was found to be febrile and had significant tenderness over the left hip and pain with passive movement. She also had a leukocytosis of 15.7. Ortho was consulted for possible septic arthritis. She was started on ceftriaxone for this. Left hip aspiration was done which was not consistent with septic joint, gout or pseudogout. She had some improvement with pain medications.  MRI showed no fractures, bilateral trochanteric bursitis, and diffuse soft tissue edema around pelvis and hips representing myositis vs muscle strain however CK was normal. She atorvastatin was switched to rosuvastatin due to lower risk of developing myositis. She started having right medical thigh pain with tenderness to palpation, no erythema or rash noted. Her daughter was called and reported that the patient has had hip  girdle pain for weeks before the onset of this more localized pain. It's unclear what specifically caused her hip pain, it may be from a myositis or the trochanteric bursitis. Septic joint and fracture was ruled out. PT evaluated and recommended SNF placement but patient declined this. She was on dilaudid and was switched to oxy IR, she became very drowsy on those medications so we did not continue this on discharge.    2. NSTEMI: Patient has a history of CAD s/p stent, DM, HTN, HLD combined systolic and diastolic heart failure. She initially presented with the hip pain, see above, and during the first night she developed an episode of chest pain that she reported felt like her previous heart attack. EKG was done which showed no ST changes, no  T wave depression, no changes from previous EKG. Troponin was done which was 7.13, cardiology was consulted and placed her on a heparin drip. She was given NTG and dilaudid which improved the pain. She had a PCI in 3/19 with DES in the LAD and LCX, and a 5/19 cath showed LM patent, mid LAD 70% proximal and distal to existing stent, patent LCX, and RCA with diffuse distal disease. Troponin down trended down. Cath was not done due to an AKI, and a drop in her hemoglobin, cardiology thought this was likely demand ischemia 2/2 her ongoing infection. Continued medical management on discharge.    3. Cellulitis: Patient had a history of charcots foot in her right leg, it is normally erythematous, edematous, indurated and warm, she reported that nothing had changed in her foot. However given her unknown source of iinfection she was started on cextriaxone for the possible hip infection. There was some mild improvement in her right lower extremity warmth and erythema. She was switched to keflex once septic joint was ruled out. She completed a total of 5 days of antibiotics. Antibiotics were not continued on discharge.   4. Anemia: Patient had a Hgb of 9.0 on admission. Close to  her baseline. Dropped to 6.9 on day 3, she was transfused 2 units for a goal of 9-10 per cardiology. Hgb increased to 9.8. Stable at 10.2 on discharge.   Discharge Vitals:   BP (!) 157/71 (BP Location: Right Arm)   Pulse (!) 51   Temp (!) 97.5 F (36.4 C) (Oral)   Resp 15   Ht 5\' 6"  (1.676 m)   Wt 66.1 kg   SpO2 98%   BMI 23.52 kg/m   Pertinent Labs, Studies, and Procedures:  CBC Latest Ref Rng & Units 01/06/2018 01/05/2018 01/04/2018  WBC 4.0 - 10.5 K/uL 8.0 8.1 7.8  Hemoglobin 12.0 - 15.0 g/dL 10.2(L) 9.8(L) 8.4(L)  Hematocrit 36.0 - 46.0 % 33.3(L) 31.6(L) 27.9(L)  Platelets 150 - 400 K/uL 229 230 223   MRI hip IMPRESSION: No MR findings of septic arthritis. No abnormal marrow edema is seen about both sides of either hip joint.  Bilateral greater trochanteric bursitis.  Diffuse soft tissue edema about the pelvis and both hips with intramuscular edema likely representing areas of myositis as above. Muscle strains are a possibility as well.  Hip x-ray IMPRESSION: No demonstrable fracture or dislocation. Moderate symmetric narrowing both hip joints. Periarticular osteoporosis bilaterally Noted.   Abdominal ultrasound IMPRESSION: 1. Gallstones without sonographic findings of acute cholecystitis. 2. Nonobstructing left renal stone.   Discharge Instructions: Discharge Instructions    Diet - low sodium heart healthy   Complete by:  As directed    Increase activity slowly   Complete by:  As directed       Signed: Claudean Severance, MD 01/06/2018, 2:42 PM   Pager: [phone]

## 2018-01-02 NOTE — Evaluation (Signed)
Physical Therapy Evaluation Patient Details Name: PAYDEN BONUS MRN: [id number] DOB: July 03, 1940 Today's Date: 01/02/2018   History of Present Illness  Ms. Suares is a 77 y/o female with PMHx of CAD with multiple stents, CKD stage III, DM, HTN, HLD, combined systolic and diastolic heart failure presents with 2 week history of progressively worsening left flank pain. Also complains of associated low-grade fever, headache and nausea. She has history of severe bilateral stasis dermatitis and Charcot foot on the right. She is followed by [facility] with planned right AKA next week    Clinical Impression  [name] admitted with the above listed diagnosis. Patient reports that prior to admission she was limited in mobility due to Charcot foot - planned AKA next week. Patient today requiring general min A +2 for limited transfers to recliner. Attempted education on use of slide board to allow for NWB of LE, however patient declining this and preferring a squat pivot transfer with physical assist from PT/OT. Will recommend sub-acute rehab due to reduced mobility, and will likely benefit from rehab following AKA.     Follow Up Recommendations SNF    Equipment Recommendations  None recommended by PT    Recommendations for Other Services OT consult     Precautions / Restrictions Precautions Precautions: Fall Restrictions Weight Bearing Restrictions: No      Mobility  Bed Mobility Overal bed mobility: Needs Assistance Bed Mobility: Supine to Sit     Supine to sit: Min assist     General bed mobility comments: Min A for upright trunk control and hip righting  Transfers Overall transfer level: Needs assistance Equipment used: 2 person hand held assist Transfers: Squat Pivot Transfers     Squat pivot transfers: Min assist;+2 physical assistance     General transfer comment: attempted to educate patient on use of slide board to allow for NWB of LE - patient refusing this and wanting  to squat-pivot to recliner  Ambulation/Gait             General Gait Details: deferred  Stairs            Wheelchair Mobility    Modified Rankin (Stroke Patients Only)       Balance Overall balance assessment: Needs assistance Sitting-balance support: Bilateral upper extremity supported Sitting balance-Leahy Scale: Fair                                       Pertinent Vitals/Pain Pain Assessment: 0-10 Pain Score: 8  Pain Location: L hip Pain Descriptors / Indicators: Discomfort;Grimacing;Burning;Constant Pain Intervention(s): Limited activity within patient's tolerance;Monitored during session;Repositioned;Patient requesting pain meds-RN notified    Home Living Family/patient expects to be discharged to:: Private residence Living Arrangements: Spouse/significant other;Children Available Help at Discharge: Family;Available 24 hours/day Type of Home: Apartment Home Access: Ramped entrance     Home Layout: One level Home Equipment: Walker - 2 wheels;Wheelchair - manual;Grab bars - toilet;Grab bars - tub/shower;Hand held shower head Additional Comments: Pt live at home with spouse and son    Prior Function Level of Independence: Needs assistance   Gait / Transfers Assistance Needed: Has been in the wheelchair for mobility. Reports several falls and husband holds on to her for support during transfers.   ADL's / Homemaking Assistance Needed: Husband assists with ADL's. Has been washing up at the sink instead of getting into the shower. States she has tried a Chiropodist  in the past and has not been able to get her feet over the edge to shower that way. Can do upper body washing and dressing and husband assists mostly with lower body washing/dressing.         Hand Dominance   Dominant Hand: Right    Extremity/Trunk Assessment   Upper Extremity Assessment Upper Extremity Assessment: Defer to OT evaluation    Lower Extremity Assessment Lower  Extremity Assessment: Generalized weakness;RLE deficits/detail;LLE deficits/detail RLE Deficits / Details: Charcot foot with open wound LLE Deficits / Details: multiple blisters/sores on L foot     Cervical / Trunk Assessment Cervical / Trunk Assessment: Kyphotic  Communication   Communication: No difficulties  Cognition Arousal/Alertness: Awake/alert Behavior During Therapy: WFL for tasks assessed/performed Overall Cognitive Status: Within Functional Limits for tasks assessed                                        General Comments General comments (skin integrity, edema, etc.): husband and son present for end of session; alerting nursing for request of pain medication; blister on L great toe bleeding - nursing notified    Exercises     Assessment/Plan    PT Assessment Patient needs continued PT services  PT Problem List Decreased strength;Decreased activity tolerance;Decreased balance;Decreased mobility;Decreased knowledge of use of DME;Decreased safety awareness       PT Treatment Interventions DME instruction;Functional mobility training;Therapeutic activities;Therapeutic exercise;Balance training;Neuromuscular re-education;Patient/family education    PT Goals (Current goals can be found in the Care Plan section)  Acute Rehab PT Goals Patient Stated Goal: improve pain PT Goal Formulation: With patient Time For Goal Achievement: 01/16/18 Potential to Achieve Goals: Fair    Frequency Min 2X/week   Barriers to discharge        Co-evaluation PT/OT/SLP Co-Evaluation/Treatment: Yes Reason for Co-Treatment: Complexity of the patient's impairments (multi-system involvement);For patient/therapist safety;To address functional/ADL transfers PT goals addressed during session: Mobility/safety with mobility;Balance         AM-PAC PT "6 Clicks" Daily Activity  Outcome Measure Difficulty turning over in bed (including adjusting bedclothes, sheets and  blankets)?: A Lot Difficulty moving from lying on back to sitting on the side of the bed? : Unable Difficulty sitting down on and standing up from a chair with arms (e.g., wheelchair, bedside commode, etc,.)?: Unable Help needed moving to and from a bed to chair (including a wheelchair)?: A Lot Help needed walking in hospital room?: Total Help needed climbing 3-5 steps with a railing? : Total 6 Click Score: 8    End of Session Equipment Utilized During Treatment: Gait belt Activity Tolerance: Patient tolerated treatment well Patient left: in chair;with call bell/phone within reach;with chair alarm set;with family/visitor present Nurse Communication: Mobility status PT Visit Diagnosis: Unsteadiness on feet (R26.81);Other abnormalities of gait and mobility (R26.89);Muscle weakness (generalized) (M62.81)    Time: 3403-5248 PT Time Calculation (min) (ACUTE ONLY): 28 min   Charges:   PT Evaluation $PT Eval Moderate Complexity: 1 Mod         Kipp Laurence, PT, DPT 01/02/18 3:41 PM Pager: [phone]

## 2018-01-02 NOTE — Progress Notes (Signed)
OT Cancellation Note  Patient Details Name: Emily Miranda MRN: [id number] DOB: Oct 04, 1940   Cancelled Treatment:    Reason Eval/Treat Not Completed: Patient at procedure or test/ unavailable  Emelda Fear 01/02/2018, 9:11 AM  Sherryl Manges OTR/L [phone]

## 2018-01-02 NOTE — Evaluation (Signed)
Occupational Therapy Evaluation Patient Details Name: Emily Miranda MRN: [id number] DOB: 24-Oct-1940 Today's Date: 01/02/2018    History of Present Illness Emily Miranda is a 77 y/o female with PMHx of CAD with multiple stents, CKD stage III, DM, HTN, HLD, combined systolic and diastolic heart failure presents with 2 week history of progressively worsening left flank pain. Also complains of associated low-grade fever, headache and nausea. She has history of severe bilateral stasis dermatitis and Charcot foot on the right. She is followed by [facility] with planned right AKA next week   Clinical Impression   PTA Pt performing sponge baths (fearful of tub bench transfers), got assist from husband/son for safety during transfers and assist for bathing/dressing. Pt currently set up at bed level/sitting for grooming and eating, min A +2 for transfer to bed from recliner, total A for LB ADL at this time. Planned AKA at [facility] next week - will need skilled care prior. Attempted to initiate education with transfer board and pt declined opting to perform SPT. She will require skilled OT in the acute setting as well as SNF prior to AKA.    Follow Up Recommendations  SNF;Supervision/Assistance - 24 hour    Equipment Recommendations  None recommended by OT    Recommendations for Other Services       Precautions / Restrictions Precautions Precautions: Fall Restrictions Weight Bearing Restrictions: No      Mobility Bed Mobility Overal bed mobility: Needs Assistance Bed Mobility: Supine to Sit     Supine to sit: Min assist     General bed mobility comments: Min A for upright trunk control and hip righting  Transfers Overall transfer level: Needs assistance Equipment used: 2 person hand held assist Transfers: Squat Pivot Transfers     Squat pivot transfers: Min assist;+2 physical assistance     General transfer comment: attempted to educate patient on use of slide board to allow for NWB of  LE - patient refusing this and wanting to squat-pivot to recliner    Balance Overall balance assessment: Needs assistance Sitting-balance support: Bilateral upper extremity supported Sitting balance-Leahy Scale: Fair                                     ADL either performed or assessed with clinical judgement   ADL Overall ADL's : Needs assistance/impaired Eating/Feeding: Set up;Sitting;Bed level   Grooming: Set up;Sitting   Upper Body Bathing: Moderate assistance   Lower Body Bathing: Maximal assistance   Upper Body Dressing : Set up   Lower Body Dressing: Maximal assistance;Bed level   Toilet Transfer: Minimal assistance;+2 for physical assistance;+2 for safety/equipment;Stand-pivot Toilet Transfer Details (indicate cue type and reason): Pt declined education and use of sliding/transfer board and elected to stand pivot transfer Toileting- Clothing Manipulation and Hygiene: Maximal assistance Toileting - Clothing Manipulation Details (indicate cue type and reason): Pt wears adult diapers at home due to urgency, using purewick when OT entered             Vision Baseline Vision/History: Wears glasses Wears Glasses: Reading only Patient Visual Report: No change from baseline Vision Assessment?: No apparent visual deficits     Perception     Praxis      Pertinent Vitals/Pain Pain Assessment: 0-10 Pain Score: 8  Pain Location: L hip Pain Descriptors / Indicators: Discomfort;Grimacing;Burning;Constant Pain Intervention(s): Limited activity within patient's tolerance;Monitored during session;Repositioned     Hand Dominance Right  Extremity/Trunk Assessment Upper Extremity Assessment Upper Extremity Assessment: Generalized weakness   Lower Extremity Assessment Lower Extremity Assessment: Defer to PT evaluation RLE Deficits / Details: Charcot foot with open wound LLE Deficits / Details: multiple blisters/sores on L foot    Cervical / Trunk  Assessment Cervical / Trunk Assessment: Kyphotic   Communication Communication Communication: No difficulties   Cognition Arousal/Alertness: Awake/alert Behavior During Therapy: WFL for tasks assessed/performed Overall Cognitive Status: Within Functional Limits for tasks assessed                                     General Comments  husband and son present for end of session; alerting nursing for request of pain medication; blister on L great toe bleeding - nursing notified    Exercises     Shoulder Instructions      Home Living Family/patient expects to be discharged to:: Private residence Living Arrangements: Spouse/significant other;Children Available Help at Discharge: Family;Available 24 hours/day Type of Home: Apartment Home Access: Ramped entrance     Home Layout: One level     Bathroom Shower/Tub: Chief Strategy Officer: Handicapped height Bathroom Accessibility: No   Home Equipment: Environmental consultant - 2 wheels;Wheelchair - manual;Grab bars - toilet;Grab bars - tub/shower;Hand held shower head   Additional Comments: Pt live at home with spouse and son      Prior Functioning/Environment Level of Independence: Needs assistance  Gait / Transfers Assistance Needed: Has been in the wheelchair for mobility. Reports several falls and husband holds on to her for support during transfers.  ADL's / Homemaking Assistance Needed: Husband assists with ADL's. Has been washing up at the sink instead of getting into the shower. States she has tried a tub bench in the past and has not been able to get her feet over the edge to shower that way. Can do upper body washing and dressing and husband assists mostly with lower body washing/dressing.             OT Problem List: Decreased strength;Decreased range of motion;Decreased activity tolerance;Impaired balance (sitting and/or standing);Decreased safety awareness;Decreased knowledge of use of DME or AE;Pain       OT Treatment/Interventions: Self-care/ADL training;DME and/or AE instruction;Therapeutic activities;Patient/family education;Balance training    OT Goals(Current goals can be found in the care plan section) Acute Rehab OT Goals Patient Stated Goal: improve pain OT Goal Formulation: With patient Time For Goal Achievement: 01/16/18 Potential to Achieve Goals: Fair ADL Goals Pt Will Perform Grooming: with modified independence;sitting Pt Will Transfer to Toilet: with min guard assist;with transfer board;bedside commode Pt Will Perform Toileting - Clothing Manipulation and hygiene: with min guard assist;sitting/lateral leans Pt Will Perform Tub/Shower Transfer: Tub transfer;with mod assist;with caregiver independent in assisting;tub bench  OT Frequency: Min 2X/week   Barriers to D/C:            Co-evaluation PT/OT/SLP Co-Evaluation/Treatment: Yes Reason for Co-Treatment: Complexity of the patient's impairments (multi-system involvement);For patient/therapist safety;To address functional/ADL transfers PT goals addressed during session: Mobility/safety with mobility;Balance OT goals addressed during session: ADL's and self-care;Strengthening/ROM;Proper use of Adaptive equipment and DME      AM-PAC PT "6 Clicks" Daily Activity     Outcome Measure Help from another person eating meals?: None Help from another person taking care of personal grooming?: A Little Help from another person toileting, which includes using toliet, bedpan, or urinal?: A Lot Help from another person bathing (  including washing, rinsing, drying)?: A Lot Help from another person to put on and taking off regular upper body clothing?: A Lot Help from another person to put on and taking off regular lower body clothing?: Total 6 Click Score: 14   End of Session Equipment Utilized During Treatment: Gait belt Nurse Communication: Mobility status;Other (comment)(bleeding from LLE blister post-transfer)  Activity  Tolerance: Patient limited by pain Patient left: in chair;with call bell/phone within reach;with family/visitor present  OT Visit Diagnosis: Unsteadiness on feet (R26.81);Other abnormalities of gait and mobility (R26.89);History of falling (Z91.81);Muscle weakness (generalized) (M62.81);Pain Pain - Right/Left: Left Pain - part of body: Hip                Time: 1281-1886 OT Time Calculation (min): 28 min Charges:  OT General Charges $OT Visit: 1 Visit OT Evaluation $OT Eval Moderate Complexity: 1 Mod  Emily Miranda OTR/L [phone]  Emily Miranda 01/02/2018, 5:43 PM

## 2018-01-02 NOTE — Progress Notes (Signed)
PT Cancellation Note  Patient Details Name: Emily Miranda MRN: [id number] DOB: 03/20/1941   Cancelled Treatment:    Reason Eval/Treat Not Completed: Patient at procedure or test/unavailable  Kipp Laurence, PT, DPT 01/02/18 9:13 AM Pager: [phone]

## 2018-01-02 NOTE — Progress Notes (Addendum)
Subjective: Patient was doing okay, she was still having left sided pain, reported it as a stabbing pain near her left groin area. She denies any falls or fractures. Has never had a history of arthritis or hip pain. She reported that there was no change in her right foot, and that she has been non-ambulatory for 3 years. She qas requesting something for the pain and we discussed that we will put something in for her. We also discussed the plan for the day and she is in agreement.  Objective:  Vital signs in last 24 hours: Vitals:   01/01/18 2338 01/02/18 0010 01/02/18 0349 01/02/18 0500  BP: (!) 135/55  113/75   Pulse: 92  93   Resp: 17  17   Temp: 99.3 F (37.4 C)  98.5 F (36.9 C)   TempSrc: Oral  Oral   SpO2: 96%  97%   Weight:  66.3 kg  65.3 kg  Height:  5\' 6"  (1.676 m)      General: Frail appearing female, no acute distress, laying flat Cardiac: RRR, normal S1, S2, no murmurs, rubs or gallops Pulmonary: Lungs CTA bilaterally, no wheezing, rhonchi or rales Abdomen: Soft, mild tenderness to palpation in left lower quadrant, no tenderness anywhere else, hypoactive bowel sounds Hip: Left hip is tender to palpation, tender to medial rotation and flexion, no erythema or wounds on skin Extremity: 1+ LE edema in right leg, charcot right foot, at a right 90 degree angle, erythematous and edematous, calf is covered with a wrap, some drainage from the area, left leg had thickened skin with some skin changes Neuro: Alert and oriented x3 Psychiatry: Appropriate mood and affect    Assessment/Plan: This is a 77 year old female with a PMH of CAD (s/p stenting), HLD, HTN, combined systolic and diastolic heart failure, DM, who presented with a a 2 week history of left sided groin pain.   Fever and Left hip pain: Patient was having pain near her left groin for the past 2 weeks. She has been having a fever and was found to have significant tenderness to palpation on her left hip and with  passive movement of the hip. Labs showed a leukocytosis. She does have a history of diabetes increasing risk for a spontaneous septic arthritis.  Bedside ultrasound showed a left hip effusion.  -Norco 1-2 tablets q4 hours -Ceftriaxone and vancomycin for possible infection -Hip MRI -Consult orthopedics  Charcots foot:  Right leg pain -Patient reports that there has been no changes in the pain or look of her foot. She has a planned AKA of her right foot. It is quite erythematous, edematous, and has thickened skin. She was placed on ceftriaaxone for possible cellulitis, still unclear if this is the source of an infection.  -Continue ceftriaxone for now  Acute on chronic kidney disease:  -On admission BUN 89, Cr 2.6, increased from her baseline -S/p 1L NS -8/23 BUN 80 Cr 2.32 -Will continue to monitor with daily BMPs  CAD:  -Continue lipitor, imdur, carvedilol, and hydralazine  DM type 2:  -12 lantus daily and sliding scale with meals   FEN: No fluids, replete lytes prn, heart healthy CM diet  VTE ppx: Heparin Code Status: DNR    Dispo: Anticipated discharge in approximately 1-2 day(s).   Claudean Severance, MD 01/02/2018, 6:14 AM Pager: [phone]    Internal Medicine Attending:   I saw and examined the patient. I reviewed the resident's note and I agree with the resident's findings  and plan as documented in the resident's note. I have made a few necessary changes to this note.   Erlinda Hong, MD

## 2018-01-03 DIAGNOSIS — N189 Chronic kidney disease, unspecified: Secondary | ICD-10-CM

## 2018-01-03 DIAGNOSIS — I252 Old myocardial infarction: Secondary | ICD-10-CM

## 2018-01-03 DIAGNOSIS — I5040 Unspecified combined systolic (congestive) and diastolic (congestive) heart failure: Secondary | ICD-10-CM

## 2018-01-03 DIAGNOSIS — I255 Ischemic cardiomyopathy: Secondary | ICD-10-CM

## 2018-01-03 DIAGNOSIS — R748 Abnormal levels of other serum enzymes: Secondary | ICD-10-CM

## 2018-01-03 DIAGNOSIS — I5041 Acute combined systolic (congestive) and diastolic (congestive) heart failure: Secondary | ICD-10-CM

## 2018-01-03 LAB — TROPONIN I
Troponin I: 7.13 ng/mL (ref <0.03)
Troponin I: 5.16 ng/mL (ref <0.03)
Troponin I: 4.43 ng/mL (ref <0.03)
Troponin I: 4.36 ng/mL (ref <0.03)

## 2018-01-03 LAB — URINE CULTURE: Culture: <10000 — AB

## 2018-01-03 LAB — GLUCOSE, CAPILLARY
Glucose-Capillary: 137 mg/dL — ABNORMAL HIGH (ref 70–99)
Glucose-Capillary: 126 mg/dL — ABNORMAL HIGH (ref 70–99)
Glucose-Capillary: 194 mg/dL — ABNORMAL HIGH (ref 70–99)
Glucose-Capillary: 200 mg/dL — ABNORMAL HIGH (ref 70–99)
Glucose-Capillary: 171 mg/dL — ABNORMAL HIGH (ref 70–99)

## 2018-01-03 LAB — BASIC METABOLIC PANEL
Anion gap: 6 (ref 5–15)
BUN: 72 mg/dL — ABNORMAL HIGH (ref 8–23)
CO2: 14 mmol/L — ABNORMAL LOW (ref 22–32)
Calcium: 8.5 mg/dL — ABNORMAL LOW (ref 8.9–10.3)
Chloride: 120 mmol/L — ABNORMAL HIGH (ref 98–111)
Creatinine, Ser: 1.98 mg/dL — ABNORMAL HIGH (ref 0.44–1.00)
GFR calc Af Amer: 27 mL/min — ABNORMAL LOW (ref >60)
GFR calc non Af Amer: 24 mL/min — ABNORMAL LOW (ref >60)
Glucose, Bld: 140 mg/dL — ABNORMAL HIGH (ref 70–99)
Potassium: 4.7 mmol/L (ref 3.5–5.1)
Sodium: 140 mmol/L (ref 135–145)

## 2018-01-03 LAB — HEPARIN LEVEL (UNFRACTIONATED): Heparin Unfractionated: 0.16 IU/mL — ABNORMAL LOW (ref 0.30–0.70)

## 2018-01-03 MED ORDER — ASPIRIN 81 MG PO CHEW
81.0000 mg | CHEWABLE_TABLET | Freq: Every day | ORAL | Status: DC
  Administered 2018-01-04 – 2018-01-06 (×3): 81 mg via ORAL
  Filled 2018-01-03 (×3): qty 1

## 2018-01-03 MED ORDER — HEPARIN BOLUS VIA INFUSION
1000.0000 [IU] | Freq: Once | INTRAVENOUS | Status: AC
  Administered 2018-01-03: 1000 [IU] via INTRAVENOUS
  Filled 2018-01-03: qty 1000

## 2018-01-03 MED ORDER — NITROGLYCERIN 0.4 MG SL SUBL
SUBLINGUAL_TABLET | SUBLINGUAL | Status: AC
  Administered 2018-01-03: 0.4 mg
  Filled 2018-01-03: qty 1

## 2018-01-03 MED ORDER — HEPARIN (PORCINE) IN NACL 100-0.45 UNIT/ML-% IJ SOLN
1150.0000 [IU]/h | INTRAMUSCULAR | Status: DC
  Administered 2018-01-03: 800 [IU]/h via INTRAVENOUS
  Administered 2018-01-04: 1150 [IU]/h via INTRAVENOUS
  Filled 2018-01-03 (×3): qty 250

## 2018-01-03 MED ORDER — ASPIRIN 325 MG PO TABS: 325.0000 mg | ORAL_TABLET | Freq: Every day | ORAL | Status: DC

## 2018-01-03 MED ORDER — CLOTRIMAZOLE 1 % EX CREA
TOPICAL_CREAM | Freq: Two times a day (BID) | CUTANEOUS | Status: DC
  Administered 2018-01-03 – 2018-01-05 (×7): via TOPICAL
  Administered 2018-01-06: 1 via TOPICAL
  Filled 2018-01-03 (×2): qty 15

## 2018-01-03 MED ORDER — HEPARIN BOLUS VIA INFUSION
4000.0000 [IU] | Freq: Once | INTRAVENOUS | Status: AC
  Administered 2018-01-03: 4000 [IU] via INTRAVENOUS
  Filled 2018-01-03: qty 4000

## 2018-01-03 MED ORDER — ASPIRIN 325 MG PO TABS
325.0000 mg | ORAL_TABLET | Freq: Every day | ORAL | Status: AC
  Administered 2018-01-03: 325 mg via ORAL
  Filled 2018-01-03: qty 1

## 2018-01-03 NOTE — Progress Notes (Signed)
Subjective: Patient was doing well. She had an episode of chest pain last night that was similar to her "heart attack" chest pain, she received nitroglycerin last night which she states improved the pain. She still reports left groin pain but she reports that it has improved since being here. She reports that it felt better after she had the joint aspiration procedure. We discussed the plan with her and she is in agreement.   Objective:  Vital signs in last 24 hours: Vitals:   01/02/18 2353 01/03/18 0145 01/03/18 0337 01/03/18 0500  BP: (!) 111/50 (!) 127/55 135/72   Pulse: 74 83 87   Resp: 16 17 16    Temp: 99.1 F (37.3 C) 97.8 F (36.6 C) 98.6 F (37 C)   TempSrc: Oral Oral Oral   SpO2: 95% 96% 97%   Weight:    66.1 kg  Height:        General: Frail appearing female, no acute distress, resting comfortably Cardiac: RRR, normal S1, S2, no murmurs, rubs or gallops  Pulmonary: Lungs CTA bilaterally, no wheezing, rhonchi or rales  Abdomen: Soft, non-tender, +bowel sounds, no guarding or masses noted, medication near bilateral groin areas  Hip: Left hip tender to palpation, no erythema or wounds noted near skin Extremity: 1+ LE edema in right leg, charcot right foot, at a right 90 degree angle Neuro: Alert and oriented x3, moves all extremities Psychiatry: Normal mood and affect   Left Hip MRI: IMPRESSION: No MR findings of septic arthritis. No abnormal marrow edema is seen about both sides of either hip joint.  Bilateral greater trochanteric bursitis.  Diffuse soft tissue edema about the pelvis and both hips with intramuscular edema likely representing areas of myositis as above. Muscle strains are a possibility as well..  Assessment/Plan: This is a 77 year old female with a PMH of CAD (s/p stenting), HLD, HTN, combined systolic and diastolic heart failure, DM, who presented with a a 2 week history of left sided groin pain.  Fever and left hip pain: She continues to have  pain in the left groin area however she reports that this improved after the arthrocentesis. Still has some tenderness to palpation in the area but less then yesterday. She has been on ceftriaxone and vancomycin. Hip MRI showed no findings of septic arthritis, bilateral greater trochanteric bursitis, diffuse soft tissue edema around the pelvis and both hips likely representing areas of myositis. Aspiration of the left hip was done which showed straw colored synovial fluid, hazy appearance, no crystals, 11 WBCs, 67 neutrophils. Not consistent with a septic joint, gout, or pseudogout. Unclear what is causing her hip pain, seems to have improved with some pain medications. Still having tenderness to palpation. It could be related to her osteoarthritis that she has. Orthopedics signed off, they did not think this was a septic joint. She has been afebrile since she has been here.  -Continue dilaudid for pain control -Up to chair -Discontinue vancomycin  NSTEMI: Developed last night, she reported as similar to it was when she had a heart attack. Associated with shortness of breath. She was hemodynamically stable. EKG that showed no changes from previous EKG and no ST changes. A troponin was obtained which came back at 7.13. She was given nitroglycerin and dilaudid. Cardiology was consulted. The thought this was likely a demand ischemia relating to her acute on chronic illness and fixed CAD. Given the elevated troponin's and history of diabetes she may have come in with an atypical presentation  of an ACS -Trending troponin's: 7.13 > 5.16 > 4.43 -Cardiology on board, appreciate recommendations -Continue clopidogrel -On heparin drip -Continue atorvastatin and carvedilol -S/p ASA 325, 81mg  daily  Charcots foot:  Right leg pain -Patient reports that there has been no changes in the pain or look of her foot. She has a planned AKA of her right foot. It is still quite erythematous, edematous, and has thickened  skin. She was placed on ceftriaxone for possible cellulitis, still unclear if this is the source of an infection.  -Continue ceftriaxone for now  Acute on chronic kidney disease: -On admission BUN 89, Cr 2.6, increased from her baseline -S/p 1L NS -8/23 BUN 80 Cr 2.32 -8/24 BUN 72 Cr 1.98  FEN: No fluids, replete lytes prn, heart healthy diet VTE ppx: Heparin Code Status: DNR   Dispo: Anticipated discharge in approximately 1-2 day(s).   Claudean Severance, MD 01/03/2018, 6:11 AM Pager: [phone]

## 2018-01-03 NOTE — Clinical Social Work Note (Signed)
Clinical Social Work Assessment  Patient Details  Name: Emily Miranda MRN: [id number] Date of Birth: [birth year]  Date of referral:  01/03/18               Reason for consult:  Facility Placement                Permission sought to share information with:  Family Supports Permission granted to share information::  Yes, Verbal Permission Granted  Name::        Agency::     Relationship::     Contact Information:     Housing/Transportation Living arrangements for the past 2 months:  Single Family Home Source of Information:  Patient Patient Interpreter Needed:  None Criminal Activity/Legal Involvement Pertinent to Current Situation/Hospitalization:  No - Comment as needed Significant Relationships:  Adult Children, Spouse Lives with:  Spouse, Adult Children Do you feel safe going back to the place where you live?  Yes Need for family participation in patient care:  Yes (Comment)  Care giving concerns:  Pt is alert and oriented to self, place, and situation.   Social Worker assessment / plan:  CSW spoke with pt at bedside. Pt states "I do not need SNF, I'm going home with my husband and son." Pt states someone is there 24/7. RNCM notified.  Employment status:  Retired Database administrator PT Recommendations:  Skilled Nursing Facility Information / Referral to community resources:  Skilled Nursing Facility  Patient/Family's Response to care:  Pt declines any questions concerning pt care at this time.  Patient/Family's Understanding of and Emotional Response to Diagnosis, Current Treatment, and Prognosis:  Pt is understanding  SNF recommendation but does not know why it was recommended when she is going home with her spouse and son.   Clinical Social Worker will sign off for now as social work intervention is no longer needed. Please consult [place] again if new need arises.    Emotional Assessment Appearance:  Appears stated age Attitude/Demeanor/Rapport:   (Patient was appropriate) Affect (typically observed):  Accepting, Appropriate, Calm Orientation:  Oriented to Self, Oriented to Place, Oriented to Situation Alcohol / Substance use:  Not Applicable Psych involvement (Current and /or in the community):  No (Comment)  Discharge Needs  Concerns to be addressed:  Patient refuses services, Care Coordination, Basic Needs Readmission within the last 30 days:  No Current discharge risk:  Dependent with Mobility Barriers to Discharge:  Continued Medical Work up   [employer], LCSW 01/03/2018, 10:47 AM

## 2018-01-03 NOTE — Progress Notes (Addendum)
ANTICOAGULATION CONSULT NOTE   Pharmacy Consult for heparin Indication: chest pain/ACS  Allergies  Allergen Reactions  . Levofloxacin Nausea And Vomiting and Other (See Comments)    Confusion  . Amlodipine Other (See Comments)    Localized edema  . Gabapentin Other (See Comments)    Disoriented, no strength in legs  . Hydrochlorothiazide Other (See Comments)    Hot and disoriented  . Ibuprofen Other (See Comments)    Affected kidneys - stopped flow of urine Reaction to Advil  . Metformin And Related Other (See Comments)    Affected kidneys - stopped flow of urine  . Simvastatin Other (See Comments)    Chest pain    Patient Measurements: Height: 5\' 6"  (167.6 cm) Weight: 145 lb 11.6 oz (66.1 kg) IBW/kg (Calculated) : 59.3 Heparin Dosing Weight: 66 kg   Vital Signs: Temp: 98.2 F (36.8 C) (08/24 1321) Temp Source: Oral (08/24 1321) BP: 93/52 (08/24 1321) Pulse Rate: 59 (08/24 1321)  Labs: Recent Labs    01/01/18 1725 01/02/18 0102 01/02/18 2205 01/03/18 0205 01/03/18 0731 01/03/18 1305 01/03/18 1506  HGB 9.0* 8.2*  --   --   --   --   --   HCT 30.4* 27.5*  --   --   --   --   --   PLT 295 258  --   --   --   --   --   HEPARINUNFRC  --   --   --   --   --   --  0.16*  CREATININE 2.60* 2.32*  --  1.98*  --   --   --   CKTOTAL  --   --  156  --   --   --   --   TROPONINI  --   --   --  7.13* 5.16* 4.43*  --     Estimated Creatinine Clearance: 23 mL/min (A) (by C-G formula based on SCr of 1.98 mg/dL (H)).   Medical History: Past Medical History:  Diagnosis Date  . AKI (acute kidney injury) (HCC)   . Aortic stenosis, mild   . CAD (coronary artery disease)    a. 09/2016 NSTEMI/Cath: RCA 70p/m, RPDA 95ost-->Med Rx;  b. 10/2016 PCI: RCA 70p/m (2.25 x 15 Resolute Onyx DES), RPDA 95ost (2.0 x 12 Resolute Onyx DES).  . Charcot foot due to diabetes mellitus (HCC)   . Chronic combined systolic (congestive) and diastolic (congestive) heart failure (HCC)    a. 07/2016  Echo: EF 40-45%, Gr2 DD, mild AS, triv AI, mild MR, mod dil LA.  . Diabetic peripheral neuropathy (HCC)   . Heart murmur   . History of blood transfusion    "related to low HgB"  . History of kidney stones   . History of stomach ulcers   . HTN (hypertension)   . Hyperlipidemia   . Migraine    "maybe once/month" (08/28/2017)  . Myocardial infarction (HCC) 07/2017   while at [facility], s/p cath with DES LAD and CFX  . NSTEMI (non-ST elevated myocardial infarction) (HCC) 10/06/2016  . Pancreatitis    "my dad died w/it at 70 yr old"  . Physical deconditioning    a. W/C bound.  . Pneumonia 10/2016  . Stasis dermatitis of both legs 05/2017  . Type II diabetes mellitus (HCC)     Medications:  Medications Prior to Admission  Medication Sig Dispense Refill Last Dose  . Ascorbic Acid (VITAMIN C) 1000 MG tablet Take 1,000 mg by  mouth daily.   week ago  . aspirin EC 81 MG tablet Take 1 tablet (81 mg total) by mouth daily. 150 tablet 2 12/31/2017  . atorvastatin (LIPITOR) 40 MG tablet Take 1 tablet (40 mg total) by mouth daily at 6 PM. (Patient taking differently: Take 40 mg by mouth daily. ) 30 tablet 3 12/31/2017 at am  . carvedilol (COREG) 25 MG tablet Take 1 tablet (25 mg total) by mouth 2 (two) times daily with a meal. 60 tablet 0 12/31/2017 at maybe 1800  . cephALEXin (KEFLEX) 500 MG capsule Take 1 capsule (500 mg total) by mouth 3 (three) times daily. 30 capsule 1 01/01/2018  . clopidogrel (PLAVIX) 75 MG tablet Take 1 tablet (75 mg total) by mouth daily with breakfast. 30 tablet 6 12/31/2017  . collagenase (SANTYL) ointment Apply topically daily. Apply to LE plantar surface daily. (Patient taking differently: Apply 1 application topically See admin instructions. Apply to LE plantar surface daily.) 15 g 0 12/31/2017  . cyclobenzaprine (FLEXERIL) 10 MG tablet Take 10 mg by mouth 3 (three) times daily.   0 12/31/2017  . diclofenac sodium (VOLTAREN) 1 % GEL Apply 2 g topically 4 (four) times daily. Please  apply to let hip. (Patient taking differently: Apply 2 g topically 4 (four) times daily. Please apply to left hip.) 1 Tube 0 12/31/2017  . furosemide (LASIX) 40 MG tablet Take 1 tablet (40 mg total) by mouth daily. 30 tablet 5 unk  . glimepiride (AMARYL) 4 MG tablet Take 2-4 mg by mouth See admin instructions. Take 1 tablet by mouth every morning, and 1/2 tablet every evening  0 unk  . insulin glargine (LANTUS) 100 UNIT/ML injection Inject 0.12 mLs (12 Units total) into the skin at bedtime. 10 mL 11 unk  . pantoprazole (PROTONIX) 40 MG tablet Take 1 tablet (40 mg total) by mouth daily. 90 tablet 1 Taking  . fexofenadine (ALLEGRA) 180 MG tablet Take 180 mg by mouth daily.   unk  . fluticasone (FLONASE) 50 MCG/ACT nasal spray Place 1 spray into both nostrils daily as needed (seasonal allergies).    unk  . HUMALOG KWIKPEN 100 UNIT/ML KiwkPen Inject 2 Units into the skin daily with supper.  3 Not Taking at Unknown time  . hydrALAZINE (APRESOLINE) 25 MG tablet Take 25 mg by mouth every 8 (eight) hours.   Not Taking at Unknown time  . HYDROcodone-acetaminophen (NORCO/VICODIN) 5-325 MG tablet Take 1 tablet by mouth every 6 (six) hours as needed for moderate pain. (Patient not taking: Reported on 01/03/2018) 10 tablet 0 Not Taking at Unknown time  . isosorbide mononitrate (IMDUR) 30 MG 24 hr tablet Take 1 tablet (30 mg total) by mouth daily. (Patient not taking: Reported on 01/03/2018) 30 tablet 6 Not Taking at Unknown time  . linagliptin (TRADJENTA) 5 MG TABS tablet Take 5 mg by mouth daily.   Not Taking at Unknown time  . mupirocin cream (BACTROBAN) 2 % Apply 1 application topically 2 (two) times daily. (Patient not taking: Reported on 01/03/2018) 15 g 0 Not Taking at Unknown time  . nitroGLYCERIN (NITROSTAT) 0.4 MG SL tablet Place 1 tablet (0.4 mg total) under the tongue every 5 (five) minutes x 3 doses as needed for chest pain. 25 tablet 3 unk  . Polyethyl Glycol-Propyl Glycol (SYSTANE OP) Place 1 drop into  both eyes daily as needed (dry eyes).   unk  . polyethylene glycol (MIRALAX / GLYCOLAX) packet Take 17 g by mouth daily as needed (constipation). Mix  in 8 oz liquid and drink   unk  . silver sulfADIAZINE (SILVADENE) 1 % cream Apply topically daily. Apply to right lower extremities affected areas, not to the plantar region (Patient not taking: Reported on 01/03/2018) 50 g 0 Not Taking at Unknown time    Assessment: 60 YOF who presented with a septic join on antibiotics now with new chest pain. Pharmacy consulted to dose IV heparin for ACS.  -initial heparin level= 0.16  Goal of Therapy:  Heparin level 0.3-0.7 units/ml Monitor platelets by anticoagulation protocol: Yes   Plan:  -heparin bolus 1000 units x1 then increase to 950 units/hr -Heparin level in 8 hours and daily wth CBC daily  Harland German, PharmD Clinical Pharmacist Please check Amion for pharmacy contact number

## 2018-01-03 NOTE — NC FL2 (Signed)
MEDICAID FL2 LEVEL OF CARE SCREENING TOOL     IDENTIFICATION  Patient Name: Emily Miranda Birthdate: 03-Mar-1941 Sex: female Admission Date (Current Location): 01/01/2018  [facility] and [place] Number:  Producer, television/film/video and Address:  [facility], [address], [place], [place] [address]      Provider Number: [id number]  Attending Physician Name and Address:  Tyson Alias, *  Relative Name and Phone Number:       Current Level of Care: Hospital Recommended Level of Care: Skilled Nursing Facility Prior Approval Number:    Date Approved/Denied:   PASRR Number: [id number]  Discharge Plan: SNF    Current Diagnoses: Patient Active Problem List   Diagnosis Date Noted  . Cellulitis in diabetic foot (HCC) 01/01/2018  . Palliative care by specialist   . Goals of care, counseling/discussion   . Acute pulmonary edema (HCC)   . DCM (dilated cardiomyopathy) (HCC)   . Nonrheumatic aortic valve stenosis   . Pressure injury of skin 10/05/2017  . CHF (congestive heart failure) (HCC) 10/04/2017  . Acute respiratory failure with hypoxia (HCC) 10/04/2017  . Acute on chronic combined systolic and diastolic HF (heart failure) (HCC) 08/29/2017  . Pre-operative clearance 08/28/2017  . Acute on chronic combined systolic and diastolic CHF (congestive heart failure) (HCC) 08/28/2017  . Cellulitis of lower extremity 05/29/2017  . Acute kidney injury (HCC) 05/29/2017  . Acute renal failure (ARF) (HCC) 05/29/2017  . Stasis dermatitis of both legs 05/29/2017  . Edema 11/24/2016  . Chronic combined systolic and diastolic heart failure (HCC) 11/09/2016  . Unstable angina (HCC) 11/04/2016  . Charcot foot due to diabetes mellitus (HCC) 10/31/2016  . Chronic anemia 10/31/2016  . Mixed hyperlipidemia   . PVD (peripheral vascular disease) (HCC)   . CAD S/P percutaneous coronary angioplasty   . Non-ST elevation (NSTEMI) myocardial infarction (HCC)  10/06/2016  . NSTEMI (non-ST elevated myocardial infarction) (HCC) 10/06/2016  . CKD (chronic kidney disease), stage III (HCC) 08/27/2016  . Chronic diabetic ulcer of right foot  08/27/2016  . Loss of weight   . Acute combined systolic and diastolic congestive heart failure (HCC)   . Shortness of breath 07/29/2016  . Wound infection 11/13/2015  . Abdominal pain, RLQ (right lower quadrant) -chronic since 2009 06/02/2013  . Constipation, chronic 06/02/2013  . SUI (stress urinary incontinence, female) 06/02/2013  . Recurrent UTI (urinary tract infection) 06/02/2013  . Hypertensive cardiomyopathy, with heart failure (HCC)   . CHEST PAIN 11/27/2009  . OVERWEIGHT 11/28/2008  . DM type 2 causing CKD stage 3 (HCC) 11/25/2008  . Mild aortic stenosis 11/25/2008  . PANCREATITIS, HX OF 11/25/2008    Orientation RESPIRATION BLADDER Height & Weight     Self, Place, Situation  Normal Incontinent Weight: 145 lb 11.6 oz (66.1 kg) Height:  5\' 6"  (167.6 cm)  BEHAVIORAL SYMPTOMS/MOOD NEUROLOGICAL BOWEL NUTRITION STATUS      Incontinent Diet(Heart healthy/carb modified, thin liquids)  AMBULATORY STATUS COMMUNICATION OF NEEDS Skin   Extensive Assist Verbally Surgical wounds(Closed incision right and left leg, guaze dressing)                       Personal Care Assistance Level of Assistance  Bathing, Feeding, Dressing Bathing Assistance: Maximum assistance Feeding assistance: Independent Dressing Assistance: Maximum assistance     Functional Limitations Info  Sight, Hearing, Speech Sight Info: Adequate Hearing Info: Adequate Speech Info: Adequate    SPECIAL CARE FACTORS FREQUENCY  PT (By licensed PT), OT (By licensed OT)     PT Frequency: 2x OT Frequency: 2x            Contractures Contractures Info: Not present    Additional Factors Info  Code Status, Allergies Code Status Info: DNR Allergies Info: Levofloxacin, Amlodipine, Gabapentin, Hydrochlorothiazide, Ibuprofen,  Metformin And Related, Simvastatin           Current Medications (01/03/2018):  This is the current hospital active medication list Current Facility-Administered Medications  Medication Dose Route Frequency Provider Last Rate Last Dose  . [START ON 01/04/2018] aspirin chewable tablet 81 mg  81 mg Oral Daily Ernest Mallick, MD      . atorvastatin (LIPITOR) tablet 40 mg  40 mg Oral q1800 Lanelle Bal, MD      . carvedilol (COREG) tablet 25 mg  25 mg Oral BID WC Lanelle Bal, MD   25 mg at 01/03/18 0909  . cefTRIAXone (ROCEPHIN) 1 g in sodium chloride 0.9 % 100 mL IVPB  1 g Intravenous Q24H Lanelle Bal, MD 200 mL/hr at 01/02/18 2337 1 g at 01/02/18 2337  . clopidogrel (PLAVIX) tablet 75 mg  75 mg Oral Q breakfast Lanelle Bal, MD   75 mg at 01/03/18 0909  . clotrimazole (LOTRIMIN) 1 % cream   Topical BID Lanelle Bal, MD      . heparin ADULT infusion 100 units/mL (25000 units/279mL sodium chloride 0.45%)  800 Units/hr Intravenous Continuous Sampson Si, RPH 8 mL/hr at 01/03/18 0905 800 Units/hr at 01/03/18 0905  . hydrALAZINE (APRESOLINE) tablet 25 mg  25 mg Oral Q8H Lanelle Bal, MD   25 mg at 01/03/18 0519  . HYDROcodone-acetaminophen (NORCO/VICODIN) 5-325 MG per tablet 1-2 tablet  1-2 tablet Oral Q4H PRN Ali Lowe, MD   2 tablet at 01/03/18 [phone]  . HYDROmorphone (DILAUDID) injection 0.5 mg  0.5 mg Intravenous Q2H PRN Claudean Severance, MD   0.5 mg at 01/03/18 0205  . insulin aspart (novoLOG) injection 0-5 Units  0-5 Units Subcutaneous QHS Lanelle Bal, MD   2 Units at 01/02/18 0125  . insulin aspart (novoLOG) injection 0-9 Units  0-9 Units Subcutaneous TID WC Lanelle Bal, MD   5 Units at 01/02/18 1723  . insulin glargine (LANTUS) injection 12 Units  12 Units Subcutaneous QHS Lanelle Bal, MD   12 Units at 01/02/18 2155  . isosorbide mononitrate (IMDUR) 24 hr tablet 30 mg  30 mg Oral Daily Lanelle Bal, MD    30 mg at 01/03/18 0909  . pantoprazole (PROTONIX) EC tablet 40 mg  40 mg Oral Daily Lanelle Bal, MD   40 mg at 01/03/18 0909  . vancomycin (VANCOCIN) IVPB 1000 mg/200 mL premix  1,000 mg Intravenous Q48H Tyson Alias, MD 200 mL/hr at 01/03/18 0611 1,000 mg at 01/03/18 6286     Discharge Medications: Please see discharge summary for a list of discharge medications.  Relevant Imaging Results:  Relevant Lab Results:   Additional Information SSN: [id number]  Maree Krabbe, LCSW

## 2018-01-03 NOTE — Progress Notes (Signed)
Internal Medicine Attending:   I saw and examined the patient. I reviewed the resident's note and I agree with the resident's findings and plan as documented in the resident's note.  Patient feels well today.  She states that her lower extremity pain is improving.  She did have an episode of chest pain last night which was relieved with nitroglycerin.  Troponins done overnight were consistent with a non-ST elevation MI (increased to greater than 7).  Cardiology follow-up and recommendations appreciated.  We will continue with heparin drip for now.  Patient given aspirin 325 mg x 1.  We will continue with aspirin 81 mg daily as well as Plavix and atorvastatin and carvedilol.  Patient has a history of known severe CAD with non-intervenable disease.  It is possible that her findings are secondary to demand ischemia but she had a pretty high troponin which would be inconsistent with this.  It is also possible that she had an initial event a couple of days ago and is trending down now.  Will discuss with cardiology.  No further work-up at this time.   Patient noted to have bilateral myositis/muscle strain on her MRI.  Aspiration of her left hip was done which showed only 11 WBCs which would not be consistent with an infectious or inflammatory arthropathy.  Her pain appears to have improved today.  Her CK was within normal limits.  We will continue to monitor closely.

## 2018-01-03 NOTE — Consult Note (Addendum)
Cardiology Consultation:   Patient ID: Emily Miranda; [id number]; May 22, 1940   Admit date: 01/01/2018 Date of Consult: 01/03/2018  Primary Care Provider: Noni Saupe, MD Primary Cardiologist: Rollene Rotunda, MD; Theodore Demark, PA-C    Patient Profile:   Emily Miranda is a 77 y.o. female with a hx of  CAD with multiple PCIs, last 07/2017 w/ DES LAD, LCX, mod AS/AI, HFrEF (EF 40-45%), CKD III, DM, HTN, HLD, pulm HTN, R foot Charcot's deformity, PAD, anemia who is being seen today for the evaluation of elevated troponin at the request of Dr. Crista Elliot.  History of Present Illness:   Emily Miranda is a 77 y.o. female with a hx of  CAD with multiple PCIs, last 07/2017 w/ DES LAD, LCX, mod AS/AI, HFrEF (EF 40-45%), CKD III, DM, HTN, HLD, pulm HTN, R foot Charcot's deformity, PAD, anemia who is being seen today for the evaluation of elevated troponin.  The patient has multiple medical comorbidities and was last seen in cardiology clinic in July for follow up appointment. At that visit she denied chest pain and reported a stable volume status. Preoperative evaluation was performed in anticipation of R AKA, and it was felt that she was high risk and that further testing would not mitigate her surgical risk. Her last echo in May showed EF 45-50% with basal-mid-inferoseptal hypokinesis, and moderate AS/AI. Her last LHC in 09/2017 showed diffuse 3V disease with patent stents; medical management was recommended.   She presented to the ED on 8/22 with worsening pain and drainage from her R ankle. She also described L flank and hip pain. She was admitted to the internal medicine service with concern for septic arthritis of the L hip. She was started on IV antibiotics and was evaluated by orthopedics, but hip was aspirated and it was felt she did not have septic arthritis.    This evening, the patient had an episode of chest pain. ECG showed sinus with first degree AV block and nonspecific  ST changes, less pronounced than at admission. Troponin was checked and positive at 7.1. Cardiology was consulted for further recommendations.  On my evaluation the patient is asleep. Upon waking her, she tells me that she experienced an episode of chest pressure and dyspnea that woke her from sleep. She states that the pain felt similar to her prior MI. It resolved spontaneously upon sitting up. She took a SL NTG, but her CP had improved by this time. She denies other complaints.   Past Medical History:  Diagnosis Date  . AKI (acute kidney injury) (HCC)   . Aortic stenosis, mild   . CAD (coronary artery disease)    a. 09/2016 NSTEMI/Cath: RCA 70p/m, RPDA 95ost-->Med Rx;  b. 10/2016 PCI: RCA 70p/m (2.25 x 15 Resolute Onyx DES), RPDA 95ost (2.0 x 12 Resolute Onyx DES).  . Charcot foot due to diabetes mellitus (HCC)   . Chronic combined systolic (congestive) and diastolic (congestive) heart failure (HCC)    a. 07/2016 Echo: EF 40-45%, Gr2 DD, mild AS, triv AI, mild MR, mod dil LA.  . Diabetic peripheral neuropathy (HCC)   . Heart murmur   . History of blood transfusion    "related to low HgB"  . History of kidney stones   . History of stomach ulcers   . HTN (hypertension)   . Hyperlipidemia   . Migraine    "maybe once/month" (08/28/2017)  . Myocardial infarction (HCC) 07/2017   while at [facility], s/p cath with DES  LAD and CFX  . NSTEMI (non-ST elevated myocardial infarction) (HCC) 10/06/2016  . Pancreatitis    "my dad died w/it at 34 yr old"  . Physical deconditioning    a. W/C bound.  . Pneumonia 10/2016  . Stasis dermatitis of both legs 05/2017  . Type II diabetes mellitus (HCC)     Past Surgical History:  Procedure Laterality Date  . ABDOMINAL HYSTERECTOMY    . APPENDECTOMY    . CARPAL TUNNEL RELEASE Bilateral   . CATARACT EXTRACTION W/ INTRAOCULAR LENS  IMPLANT, BILATERAL Bilateral   . CORONARY ANGIOPLASTY WITH STENT PLACEMENT  07/2017   "@ [facility]"  . CORONARY STENT INTERVENTION  N/A 11/05/2016   Procedure: Coronary Stent Intervention;  Surgeon: Lennette Bihari, MD;  Location: [facility] INVASIVE CV LAB;  Service: Cardiovascular;  Laterality: N/A;  . DILATION AND CURETTAGE OF UTERUS    . EXCISIONAL HEMORRHOIDECTOMY    . FOOT SURGERY Right    "ulcer"  . GLAUCOMA SURGERY Bilateral   . LAPAROSCOPIC INCISIONAL / UMBILICAL / VENTRAL HERNIA REPAIR  01/04/2008   Dr Bertram Savin  . LAPAROSCOPIC LYSIS OF ADHESIONS  2007   Dr Donovan Kail  . LEFT HEART CATH AND CORONARY ANGIOGRAPHY N/A 10/11/2016   Procedure: Left Heart Cath and Coronary Angiography;  Surgeon: Yvonne Kendall, MD;  Location: [facility] INVASIVE CV LAB;  Service: Cardiovascular;  Laterality: N/A;  . OVARIAN CYST REMOVAL    . RIGHT/LEFT HEART CATH AND CORONARY ANGIOGRAPHY N/A 10/09/2017   Procedure: RIGHT/LEFT HEART CATH AND CORONARY ANGIOGRAPHY;  Surgeon: Lyn Records, MD;  Location: [facility] INVASIVE CV LAB;  Service: Cardiovascular;  Laterality: N/A;  . ROBOT ASSISTED PYELOPLASTY  02/2007   Dr Laverle Patter  . SUTURE REMOVAL  08/17/2009   Dr Bertram Savin .  Right paramedian GoreTex stitch  . TONSILLECTOMY       Home Medications:  Prior to Admission medications   Medication Sig Start Date End Date Taking? Authorizing Provider  Ascorbic Acid (VITAMIN C) 1000 MG tablet Take 1,000 mg by mouth daily.   Yes [provider]  aspirin EC 81 MG tablet Take 1 tablet (81 mg total) by mouth daily. 09/04/17 09/04/18 Yes Berton Bon, NP  atorvastatin (LIPITOR) 40 MG tablet Take 1 tablet (40 mg total) by mouth daily at 6 PM. Patient taking differently: Take 40 mg by mouth daily.  08/05/16  Yes Garth Bigness, MD  carvedilol (COREG) 25 MG tablet Take 1 tablet (25 mg total) by mouth 2 (two) times daily with a meal. 10/18/17 01/02/18 Yes Zigmund Daniel., MD  cephALEXin (KEFLEX) 500 MG capsule Take 1 capsule (500 mg total) by mouth 3 (three) times daily. 12/05/17  Yes Asencion Islam, DPM  clopidogrel (PLAVIX) 75 MG tablet Take 1 tablet (75  mg total) by mouth daily with breakfast. 11/10/16  Yes Creig Hines, NP  collagenase (SANTYL) ointment Apply topically daily. Apply to LE plantar surface daily. Patient taking differently: Apply 1 application topically See admin instructions. Apply to LE plantar surface daily. 06/04/17  Yes Arrien, York Ram, MD  cyclobenzaprine (FLEXERIL) 10 MG tablet Take 10 mg by mouth 3 (three) times daily.  10/16/16  Yes [provider]  diclofenac sodium (VOLTAREN) 1 % GEL Apply 2 g topically 4 (four) times daily. Please apply to let hip. Patient taking differently: Apply 2 g topically 4 (four) times daily. Please apply to left hip. 06/03/17  Yes Arrien, York Ram, MD  fexofenadine (ALLEGRA) 180 MG tablet Take 180 mg by  mouth daily.    [provider]  fluticasone (FLONASE) 50 MCG/ACT nasal spray Place 1 spray into both nostrils daily as needed (seasonal allergies).  02/05/16   [provider]  furosemide (LASIX) 40 MG tablet Take 1 tablet (40 mg total) by mouth daily. 09/05/17   Berton Bon, NP  glimepiride (AMARYL) 4 MG tablet Take 2-4 mg by mouth See admin instructions. Take 1 tablet by mouth every morning, and 1/2 tablet every evening 09/15/17   [provider]  HUMALOG KWIKPEN 100 UNIT/ML KiwkPen Inject 2 Units into the skin daily with supper. 08/26/17   [provider]  hydrALAZINE (APRESOLINE) 25 MG tablet Take 25 mg by mouth every 8 (eight) hours.    [provider]  HYDROcodone-acetaminophen (NORCO/VICODIN) 5-325 MG tablet Take 1 tablet by mouth every 6 (six) hours as needed for moderate pain. 06/03/17   Arrien, York Ram, MD  insulin glargine (LANTUS) 100 UNIT/ML injection Inject 0.12 mLs (12 Units total) into the skin at bedtime. Patient taking differently: Inject 40 Units into the skin at bedtime.  09/04/17   Berton Bon, NP  isosorbide mononitrate (IMDUR) 30 MG 24 hr tablet Take 1 tablet (30 mg total) by mouth daily.  11/09/16   Creig Hines, NP  linagliptin (TRADJENTA) 5 MG TABS tablet Take 5 mg by mouth daily.    [provider]  mupirocin cream (BACTROBAN) 2 % Apply 1 application topically 2 (two) times daily. 05/23/17   Asencion Islam, DPM  nitroGLYCERIN (NITROSTAT) 0.4 MG SL tablet Place 1 tablet (0.4 mg total) under the tongue every 5 (five) minutes x 3 doses as needed for chest pain. 11/09/16   Creig Hines, NP  pantoprazole (PROTONIX) 40 MG tablet Take 1 tablet (40 mg total) by mouth daily. 07/04/17   Rollene Rotunda, MD  Polyethyl Glycol-Propyl Glycol (SYSTANE OP) Place 1 drop into both eyes daily as needed (dry eyes).    [provider]  polyethylene glycol (MIRALAX / GLYCOLAX) packet Take 17 g by mouth daily as needed (constipation). Mix in 8 oz liquid and drink    [provider]  silver sulfADIAZINE (SILVADENE) 1 % cream Apply topically daily. Apply to right lower extremities affected areas, not to the plantar region 06/04/17   Arrien, York Ram, MD    Inpatient Medications: Scheduled Meds: . atorvastatin  40 mg Oral q1800  . carvedilol  25 mg Oral BID WC  . clopidogrel  75 mg Oral Q breakfast  . clotrimazole   Topical BID  . heparin  5,000 Units Subcutaneous Q8H  . hydrALAZINE  25 mg Oral Q8H  . insulin aspart  0-5 Units Subcutaneous QHS  . insulin aspart  0-9 Units Subcutaneous TID WC  . insulin glargine  12 Units Subcutaneous QHS  . isosorbide mononitrate  30 mg Oral Daily  . pantoprazole  40 mg Oral Daily   Continuous Infusions: . cefTRIAXone (ROCEPHIN)  IV 1 g (01/02/18 2337)  . vancomycin 1,000 mg (01/03/18 0611)   PRN Meds: HYDROcodone-acetaminophen, HYDROmorphone (DILAUDID) injection  Allergies:    Allergies  Allergen Reactions  . Levofloxacin Nausea And Vomiting and Other (See Comments)    Confusion  . Amlodipine Other (See Comments)    Localized edema  . Gabapentin Other (See Comments)    Disoriented, no strength  in legs  . Hydrochlorothiazide Other (See Comments)    Hot and disoriented  . Ibuprofen Other (See Comments)    Affected kidneys - stopped flow of urine Reaction to  Advil  . Metformin And Related Other (See Comments)    Affected kidneys - stopped flow of urine  . Simvastatin Other (See Comments)    Chest pain    Social History:   Social History   Socioeconomic History  . Marital status: Married    Spouse name: Not on file  . Number of children: Not on file  . Years of education: Not on file  . Highest education level: Not on file  Occupational History  . Not on file  Social Needs  . Financial resource strain: Not on file  . Food insecurity:    Worry: Not on file    Inability: Not on file  . Transportation needs:    Medical: Not on file    Non-medical: Not on file  Tobacco Use  . Smoking status: Never Smoker  . Smokeless tobacco: Never Used  Substance and Sexual Activity  . Alcohol use: No  . Drug use: No  . Sexual activity: Not on file  Lifestyle  . Physical activity:    Days per week: Not on file    Minutes per session: Not on file  . Stress: Not on file  Relationships  . Social connections:    Talks on phone: Not on file    Gets together: Not on file    Attends religious service: Not on file    Active member of club or organization: Not on file    Attends meetings of clubs or organizations: Not on file    Relationship status: Not on file  . Intimate partner violence:    Fear of current or ex partner: Not on file    Emotionally abused: Not on file    Physically abused: Not on file    Forced sexual activity: Not on file  Other Topics Concern  . Not on file  Social History Narrative  . Not on file    Family History:    Family History  Problem Relation Age of Onset  . Chronic Renal Failure Mother   . Diabetes Father   . Heart attack Father 37     ROS:  Please see the history of present illness.  All other ROS reviewed and negative.     Physical  Exam/Data:   Vitals:   01/02/18 2353 01/03/18 0145 01/03/18 0337 01/03/18 0500  BP: (!) 111/50 (!) 127/55 135/72   Pulse: 74 83 87   Resp: 16 17 16    Temp: 99.1 F (37.3 C) 97.8 F (36.6 C) 98.6 F (37 C)   TempSrc: Oral Oral Oral   SpO2: 95% 96% 97%   Weight:    66.1 kg  Height:        Intake/Output Summary (Last 24 hours) at 01/03/2018 0620 Last data filed at 01/02/2018 2200 Gross per 24 hour  Intake 340 ml  Output -  Net 340 ml   Filed Weights   01/02/18 0010 01/02/18 0500 01/03/18 0500  Weight: 66.3 kg 65.3 kg 66.1 kg   Body mass index is 23.52 kg/m.  General:  Well nourished, well developed, in no acute distress  HEENT: normal Neck: no significant JVD Cardiac:  normal S1, S2; RRR; III/VI systolic murmur with faint diastolic mumur   Lungs:  clear to auscultation bilaterally, no wheezing, rhonchi or rales  Abd: soft, nontender  Ext: LE in bandages, R charcot foot, trace edema bilaterally.  Musculoskeletal:  No deformities, BUE and BLE strength normal and equal Skin: warm and dry  Neuro: No focal abnormalities  noted Psych:  Normal affect   EKG:  The EKG was personally reviewed and demonstrates:  sinus with first degree AV block and nonspecific ST changes Telemetry:  Telemetry was personally reviewed and demonstrates:  Sinus with some blocked PACs  Relevant CV Studies:  ECHO: 10/07/2017 - Left ventricle: The cavity size was normal. There was mild concentric hypertrophy. Systolic function was mildly reduced. The estimated ejection fraction was in the range of 45% to 50%. Hypokinesis of the basal-midinferoseptal myocardium. Doppler parameters are consistent with abnormal left ventricular relaxation (grade 1 diastolic dysfunction). Doppler parameters are consistent with high ventricular filling pressure. - Aortic valve: There was moderate stenosis. There was moderate regurgitation. Peak velocity (S): 365 cm/s. Mean gradient (S): 26 mm Hg. Valve  area (VTI): 0.85 cm^2. Valve area (Vmax): 0.84 cm^2. Valve area (Vmean): 0.81 cm^2. Regurgitation pressure half-time: 379 ms. - Mitral valve: Transvalvular velocity was within the normal range. There was no evidence for stenosis. There was mild regurgitation. - Left atrium: The atrium was mildly dilated. - Right ventricle: The cavity size was normal. Wall thickness was normal. Systolic function was normal. - Right atrium: The atrium was severely dilated. - Tricuspid valve: There was mild regurgitation. - Pulmonary arteries: Systolic pressure was severely increased. PA peak pressure: 60 mm Hg (S).  Cardiac CATH: 10/09/2017  Moderate pulmonary hypertension -mean PA pressure 35 mmHg.  Diffuse three-vessel coronary disease with widely patent stents.  Left main is widely patent  Mid LAD contains a long region of stent which is widely patent. Proximal and distal to the stent there is diffuse coronary disease with up to 70% narrowing. This vessel is significantly improved since the pre-PCI comparison from June 2018.  Widely patent stent in the proximal to mid circumflex. Moderate diffuse disease is noted beyond the stent.  Widely patent stent in the mid RCA and in the proximal portion of the PDA. Diffuse disease in the distal RCA improved compared to prior angiogram June 2018.  Normal left ventricular end-diastolic pressure.  Mild to moderate gradient across the aortic valve with calculated aortic valve area 1.48 cm  RECOMMENDATIONS:   Further management per treating team. Diagnostic Diagram         Laboratory Data:  Chemistry Recent Labs  Lab 01/01/18 1725 01/02/18 0102 01/03/18 0205  NA 136 136 140  K 5.6* 4.9 4.7  CL 113* 117* 120*  CO2 15* 13* 14*  GLUCOSE 302* 267* 140*  BUN 89* 80* 72*  CREATININE 2.60* 2.32* 1.98*  CALCIUM 9.0 8.6* 8.5*  GFRNONAA 17* 19* 24*  GFRAA 20* 23* 27*  ANIONGAP 8 6 6     Recent Labs  Lab 01/01/18 1725  PROT  7.0  ALBUMIN 2.4*  AST 14*  ALT 17  ALKPHOS 114  BILITOT 0.4   Hematology Recent Labs  Lab 01/01/18 1725 01/02/18 0102  WBC 15.7* 17.5*  RBC 3.54* 3.25*  HGB 9.0* 8.2*  HCT 30.4* 27.5*  MCV 85.9 84.6  MCH 25.4* 25.2*  MCHC 29.6* 29.8*  RDW 18.4* 18.5*  PLT 295 258   Cardiac Enzymes Recent Labs  Lab 01/03/18 0205  TROPONINI 7.13*   No results for input(s): TROPIPOC in the last 168 hours.  BNPNo results for input(s): BNP, PROBNP in the last 168 hours.  DDimer No results for input(s): DDIMER in the last 168 hours.  Radiology/Studies:  US Abdomen Complete  Result Date: 01/02/2018 CLINICAL DATA:  Left upper quadrant pain.  Acute kidney injury. EXAM: ABDOMEN ULTRASOUND COMPLETE COMPARISON:  Renal  ultrasound 10/09/2017.  Abdominal CT 12/02/2016 FINDINGS: Gallbladder: Physiologically distended containing layering gallstones. No gallbladder wall thickening or pericholecystic fluid. No sonographic Murphy sign noted by sonographer. Common bile duct: Diameter: 4-5 mm, normal. Liver: No focal lesion identified. Within normal limits in parenchymal echogenicity. Portal vein is patent on color Doppler imaging with normal direction of blood flow towards the liver. IVC: No abnormality visualized. Pancreas: Not well visualized. Spleen: Size and appearance within normal limits. Right Kidney: Length: 11.7 cm. Minimal perinephric fluid, likely chronic and seen on prior abdominal CT. Echogenicity within normal limits. No mass or hydronephrosis visualized. Left Kidney: Length: 11.0 cm. Lobular contours. 4 mm shadowing calculus in the lateral kidney. 1.8 cm cyst in the mid kidney. Echogenicity within normal limits. No solid mass or hydronephrosis visualized. Abdominal aorta: No aneurysm visualized. Other findings: Urinary bladder is physiologically distended without wall thickening. IMPRESSION: 1. Gallstones without sonographic findings of acute cholecystitis. 2. Nonobstructing left renal stone.  Electronically Signed   By: Rubye Oaks M.D.   On: 01/02/2018 05:07   Mr Hip Left Wo Contrast  Result Date: 01/02/2018 CLINICAL DATA:  Two week history of left-sided groin pain. Rule out septic arthritis. EXAM: MR OF THE LEFT HIP WITHOUT CONTRAST TECHNIQUE: Multiplanar, multisequence MR imaging was performed. No intravenous contrast was administered. COMPARISON:  None. FINDINGS: Bones: No findings of septic arthritis of either hip. No marrow signal abnormality about either side of the hip joints. Articular cartilage and labrum Articular cartilage:  No focal chondral defect. Labrum: Mild degeneration of the anterior superior labrum without definite tear given lack of joint distention. Joint or bursal effusion Joint effusion: Trace right hip joint effusion. Physiologic amount of fluid in the left hip joint. Bursae: Greater trochanteric bursal fluid bilaterally. Muscles and tendons Muscles and tendons: Intramuscular edema noted of the pectineus, obturator externus, adductor and gluteal medius muscles. Findings likely represent myositis potentially intramuscular strains. Other findings Miscellaneous: Diffuse soft tissue edema about the pelvis and both hips. Small focus of fluid intramuscular fluid of the right thigh measuring 3.3 x 1.6 cm. There is degenerative disc disease of the included lower lumbar spine from inferior endplate of L2 through S1. Intact sacrum, sacroiliac joints and pubic symphysis. IMPRESSION: No MR findings of septic arthritis. No abnormal marrow edema is seen about both sides of either hip joint. Bilateral greater trochanteric bursitis. Diffuse soft tissue edema about the pelvis and both hips with intramuscular edema likely representing areas of myositis as above. Muscle strains are a possibility as well. Electronically Signed   By: Tollie Eth M.D.   On: 01/02/2018 21:41   Dg Fluoro Guided Needle Plc Aspiration/injection Loc  Result Date: 01/02/2018 CLINICAL DATA:  Severe left hip  pain and leukocytosis. Concern for septic arthritis. FLUOROSCOPY TIME:  Radiation Exposure Index (as provided by the fluoroscopic device): 0.9 mGy Fluoroscopy Time:  12 seconds Number of Acquired Images:  0 PROCEDURE: The risks and benefits of the procedure were discussed with the patient, and written informed consent was obtained. The patient stated no history of allergy to contrast media. A formal timeout procedure was performed with the patient according to departmental protocol. The patient was placed supine on the fluoroscopy table and the left hip joint was identified under fluoroscopy. The skin overlying the left hip joint was subsequently cleaned with Betadine and a sterile drape was placed over the area of interest. 2 ml 1% Lidocaine was used to anesthetize the skin around the needle insertion site. A 3.5 inch 20 gauge spinal  needle was inserted into the left hip joint under fluoroscopy. Initial aspiration yielded no fluid. Therefore, 5 mL of sterile saline were injected into the joint. 2.5 mL of fluid were re-aspirated and sent to the lab for further analysis. The needle was removed and hemostasis was achieved. The patient tolerated the procedure well without complication. IMPRESSION: Technically successful left hip aspiration. Electronically Signed   By: Obie Dredge M.D.   On: 01/02/2018 18:42   Dg Hip Unilat With Pelvis 1v Left  Result Date: 01/02/2018 CLINICAL DATA:  Pain for several days EXAM: DG HIP (WITH OR WITHOUT PELVIS) 2+ V COMPARISON:  May 29, 2017 FINDINGS: Frontal pelvis as well as frontal and lateral left hip images were obtained. No fracture or dislocation. There is moderate narrowing of each hip joint. No erosive change. There is periarticular osteoporosis. There is mesh in lower abdomen and pelvic regions. IMPRESSION: No demonstrable fracture or dislocation. Moderate symmetric narrowing both hip joints. Periarticular osteoporosis bilaterally noted. Electronically Signed   By:  Bretta Bang III M.D.   On: 01/02/2018 09:16    Assessment and Plan:   Elevated troponin  Chest pain  CAD with multiple prior PCI  The patient has known severe CAD with non-intervenable disease. She is hospitalized for acute infection of her chronic LE wounds. Troponin was checked after a self-limiting episode of chest pain that resolved within minutes without pharmacologic intervention. Initial measurement was found to be significantly elevated at 7. ECG showed no acute ischemic changes. The cause of this constellation of findings is not clear. It is unlikely that she would mount a troponin of this magnitude so shortly after a brief episode of chest pain. Rather, it is likely that there is an element of demand ischemia from her acute on chronic illness in the setting of severe, fixed CAD. Nevertheless, her given her history and concerning symptoms, empiric treatment for ACS is reasonable until clinical trajectory is better characterized. -Continue to trend troponin and monitor symptoms. Repeat ECG for chest pain.  -ASA 325 x1 and 81 mg daily ordered -Continue home clopidogrel -Starting heparin gtt -Continue atorvastatin -Continue carvedilol   Chronic combined systolic and diastolic CHF: The patient appears reasonably euvolemic on exam, particularly in setting of resuscitation for sepsis.  -Continue carvedilol -Continue isosorbide and hydralazine.  -Can diurese PRN   Hypertension:  -Continue carvedilol   HLD -Continue atorvastatin   For questions or updates, please contact [facility] Please consult [email or web address] for contact info under Cardiology/STEMI.   Signed, Ernest Mallick, MD  01/03/2018 6:20 AM

## 2018-01-03 NOTE — Progress Notes (Addendum)
Paged for chest pain. Patient informed he nurse that she was having chest pain similar to her prior "heart attack" chest pain which feels as if the "life has gone out of her." Visited patient at bedside, who appeared stable hemodynamically speaking with vitals below. EKG was being performed which failed to demonstrate any change from prior EKG. There were no ST elevations/ inversions, nor T-wave depression. QRS was not widened, no LBBB noted. (see media tab).She stated that the pain occurred suddenly at rest, was not pleuritic in nature, rating at a 6/10, nonradiating, not associated w/ her abdominal pain, not reproducible on exam, although there is RUQ, and RL rib pain in the area which is reportedly not similar to her primary chest pain.   Cardiac exam revealed a new early systolic murmur, III/IV, best heard at the LUS boarder. Most recent Echo did not demonstrate more than moderate aortic stenosis and regurg w/ LVEF of 45/50% and G1DD. This murmur was not previously auscultated on the prior evenings physical exam.   Last Cath in late May 2019/early June 2019 w/o notable worsening of her CAD w/ recommendation to pursue medical management.  Given the patients extensive cardiac history and recent chest pain I will order troponins x 3 and we will administer a single NTG tablet. Will follow the troponins and symptoms.   Vitals:   01/02/18 2353 01/03/18 0145  BP: (!) 111/50 (!) 127/55  Pulse: 74 83  Resp: 16 17  Temp: 99.1 F (37.3 C) 97.8 F (36.6 C)  SpO2: 95% 96%   Addendum: Troponin returned at 7.13. Consulted cardiologies Dr. Allena Earing, who agreed to see the patient at ~0410 hours, we appreciate their recommendations given her extensive history and comorbid conditions. Would likely initiate heparin therapy but will await their official recommendations.  Revisited patient who stated that her pain was persistent, but improved with the NTG and dilaudid.  Will follow troponins and cardiology  recommendations.   Lanelle Bal, MD Internal Medicine PGY-2 Pager # [phone] (Team Pager)

## 2018-01-03 NOTE — Clinical Social Work Note (Signed)
Pt is refusing SNF. Pt states she will go home with her husband and son. Pt states someone is there 24/7. RNCM notified. Clinical Social Worker will sign off for now as social work intervention is no longer needed. Please consult [place] again if new need arises.   Emily Miranda 01/03/2018

## 2018-01-03 NOTE — Care Management Note (Addendum)
Case Management Note  Patient Details  Name: Emily Miranda MRN: [id number] Date of Birth: Jun 11, 1940  Subjective/Objective:      Pt presented from home with husband/son for fever, Left hip pain, ACS.  Pt has AKA of right foot planned for next week.  Pt has refused SNF and wants to go home with [facility].  Pt has used [facility] in the past and wants to use again.  Pt states she has WC and all other necessary DME at home.  No other DME needed.          Action/Plan: Pt will need HH orders/Face to face for PT, OT, RN, NA.  Will need dressing change instructions in the comments section of the [facility] order.      Expected Discharge Date:       01/05/18           Expected Discharge Plan:  Home w Home Health Services  In-House Referral:  NA  Discharge planning Services  CM Consult  Post Acute Care Choice:  Home Health Choice offered to:  Patient  DME Arranged:  N/A DME Agency:  NA  HH Arranged:    HH Agency:  [facility]  Status of Service:  In process, will continue to follow  If discussed at Long Length of Stay Meetings, dates discussed:    Additional Comments:  Deveron Furlong, RN 01/03/2018, 4:50 PM

## 2018-01-03 NOTE — Progress Notes (Signed)
ANTICOAGULATION CONSULT NOTE - Initial Consult  Pharmacy Consult for heparin Indication: chest pain/ACS  Allergies  Allergen Reactions  . Levofloxacin Nausea And Vomiting and Other (See Comments)    Confusion  . Amlodipine Other (See Comments)    Localized edema  . Gabapentin Other (See Comments)    Disoriented, no strength in legs  . Hydrochlorothiazide Other (See Comments)    Hot and disoriented  . Ibuprofen Other (See Comments)    Affected kidneys - stopped flow of urine Reaction to Advil  . Metformin And Related Other (See Comments)    Affected kidneys - stopped flow of urine  . Simvastatin Other (See Comments)    Chest pain    Patient Measurements: Height: 5\' 6"  (167.6 cm) Weight: 145 lb 11.6 oz (66.1 kg) IBW/kg (Calculated) : 59.3 Heparin Dosing Weight: 66 kg   Vital Signs: Temp: 98.6 F (37 C) (08/24 0337) Temp Source: Oral (08/24 0337) BP: 135/72 (08/24 0337) Pulse Rate: 87 (08/24 0337)  Labs: Recent Labs    01/01/18 1725 01/02/18 0102 01/02/18 2205 01/03/18 0205  HGB 9.0* 8.2*  --   --   HCT 30.4* 27.5*  --   --   PLT 295 258  --   --   CREATININE 2.60* 2.32*  --  1.98*  CKTOTAL  --   --  156  --   TROPONINI  --   --   --  7.13*    Estimated Creatinine Clearance: 23 mL/min (A) (by C-G formula based on SCr of 1.98 mg/dL (H)).   Medical History: Past Medical History:  Diagnosis Date  . AKI (acute kidney injury) (HCC)   . Aortic stenosis, mild   . CAD (coronary artery disease)    a. 09/2016 NSTEMI/Cath: RCA 70p/m, RPDA 95ost-->Med Rx;  b. 10/2016 PCI: RCA 70p/m (2.25 x 15 Resolute Onyx DES), RPDA 95ost (2.0 x 12 Resolute Onyx DES).  . Charcot foot due to diabetes mellitus (HCC)   . Chronic combined systolic (congestive) and diastolic (congestive) heart failure (HCC)    a. 07/2016 Echo: EF 40-45%, Gr2 DD, mild AS, triv AI, mild MR, mod dil LA.  . Diabetic peripheral neuropathy (HCC)   . Heart murmur   . History of blood transfusion    "related to  low HgB"  . History of kidney stones   . History of stomach ulcers   . HTN (hypertension)   . Hyperlipidemia   . Migraine    "maybe once/month" (08/28/2017)  . Myocardial infarction (HCC) 07/2017   while at [facility], s/p cath with DES LAD and CFX  . NSTEMI (non-ST elevated myocardial infarction) (HCC) 10/06/2016  . Pancreatitis    "my dad died w/it at 31 yr old"  . Physical deconditioning    a. W/C bound.  . Pneumonia 10/2016  . Stasis dermatitis of both legs 05/2017  . Type II diabetes mellitus (HCC)     Medications:  Medications Prior to Admission  Medication Sig Dispense Refill Last Dose  . Ascorbic Acid (VITAMIN C) 1000 MG tablet Take 1,000 mg by mouth daily.   week ago  . aspirin EC 81 MG tablet Take 1 tablet (81 mg total) by mouth daily. 150 tablet 2 12/31/2017  . atorvastatin (LIPITOR) 40 MG tablet Take 1 tablet (40 mg total) by mouth daily at 6 PM. (Patient taking differently: Take 40 mg by mouth daily. ) 30 tablet 3 12/31/2017 at am  . carvedilol (COREG) 25 MG tablet Take 1 tablet (25 mg total) by  mouth 2 (two) times daily with a meal. 60 tablet 0 12/31/2017 at maybe 1800  . cephALEXin (KEFLEX) 500 MG capsule Take 1 capsule (500 mg total) by mouth 3 (three) times daily. 30 capsule 1 8/21? at pm?  . clopidogrel (PLAVIX) 75 MG tablet Take 1 tablet (75 mg total) by mouth daily with breakfast. 30 tablet 6 12/31/2017  . collagenase (SANTYL) ointment Apply topically daily. Apply to LE plantar surface daily. (Patient taking differently: Apply 1 application topically See admin instructions. Apply to LE plantar surface daily.) 15 g 0 12/31/2017  . cyclobenzaprine (FLEXERIL) 10 MG tablet Take 10 mg by mouth 3 (three) times daily.   0 12/31/2017  . diclofenac sodium (VOLTAREN) 1 % GEL Apply 2 g topically 4 (four) times daily. Please apply to let hip. (Patient taking differently: Apply 2 g topically 4 (four) times daily. Please apply to left hip.) 1 Tube 0 12/31/2017  . fexofenadine (ALLEGRA) 180 MG  tablet Take 180 mg by mouth daily.   Taking  . fluticasone (FLONASE) 50 MCG/ACT nasal spray Place 1 spray into both nostrils daily as needed (seasonal allergies).    Taking  . furosemide (LASIX) 40 MG tablet Take 1 tablet (40 mg total) by mouth daily. 30 tablet 5 Taking  . glimepiride (AMARYL) 4 MG tablet Take 2-4 mg by mouth See admin instructions. Take 1 tablet by mouth every morning, and 1/2 tablet every evening  0 Taking  . HUMALOG KWIKPEN 100 UNIT/ML KiwkPen Inject 2 Units into the skin daily with supper.  3 Taking  . hydrALAZINE (APRESOLINE) 25 MG tablet Take 25 mg by mouth every 8 (eight) hours.   Taking  . HYDROcodone-acetaminophen (NORCO/VICODIN) 5-325 MG tablet Take 1 tablet by mouth every 6 (six) hours as needed for moderate pain. 10 tablet 0 Taking  . insulin glargine (LANTUS) 100 UNIT/ML injection Inject 0.12 mLs (12 Units total) into the skin at bedtime. (Patient taking differently: Inject 40 Units into the skin at bedtime. ) 10 mL 11 Taking  . isosorbide mononitrate (IMDUR) 30 MG 24 hr tablet Take 1 tablet (30 mg total) by mouth daily. 30 tablet 6 Taking  . linagliptin (TRADJENTA) 5 MG TABS tablet Take 5 mg by mouth daily.   Taking  . mupirocin cream (BACTROBAN) 2 % Apply 1 application topically 2 (two) times daily. 15 g 0 Taking  . nitroGLYCERIN (NITROSTAT) 0.4 MG SL tablet Place 1 tablet (0.4 mg total) under the tongue every 5 (five) minutes x 3 doses as needed for chest pain. 25 tablet 3 Taking  . pantoprazole (PROTONIX) 40 MG tablet Take 1 tablet (40 mg total) by mouth daily. 90 tablet 1 Taking  . Polyethyl Glycol-Propyl Glycol (SYSTANE OP) Place 1 drop into both eyes daily as needed (dry eyes).   Taking  . polyethylene glycol (MIRALAX / GLYCOLAX) packet Take 17 g by mouth daily as needed (constipation). Mix in 8 oz liquid and drink   Taking  . silver sulfADIAZINE (SILVADENE) 1 % cream Apply topically daily. Apply to right lower extremities affected areas, not to the plantar region  50 g 0 Taking    Assessment: 77 YOF who presented with a septic join on antibiotics now with new chest pain. Pharmacy consulted to start IV heparin for ACS. Initial troponin is elevated at 7.13. H/H low, Plt wnl.   Goal of Therapy:  Heparin level 0.3-0.7 units/ml Monitor platelets by anticoagulation protocol: Yes   Plan:  -Heparin 4000 units IV bolus, then start heparin infusion at  800 units/hr -F/u 8 hr HL -Monitor daily HL, CBC and s/s of bleeding -F/u cardiology recommendations   Vinnie Level, PharmD., BCPS Clinical Pharmacist Clinical phone for 01/03/18 until 8am: [phone]

## 2018-01-04 DIAGNOSIS — I214 Non-ST elevation (NSTEMI) myocardial infarction: Secondary | ICD-10-CM

## 2018-01-04 LAB — GLUCOSE, CAPILLARY
Glucose-Capillary: 182 mg/dL — ABNORMAL HIGH (ref 70–99)
Glucose-Capillary: 202 mg/dL — ABNORMAL HIGH (ref 70–99)
Glucose-Capillary: 217 mg/dL — ABNORMAL HIGH (ref 70–99)
Glucose-Capillary: 196 mg/dL — ABNORMAL HIGH (ref 70–99)

## 2018-01-04 LAB — HEPARIN LEVEL (UNFRACTIONATED)
Heparin Unfractionated: 0.16 IU/mL — ABNORMAL LOW (ref 0.30–0.70)
Heparin Unfractionated: 0.21 IU/mL — ABNORMAL LOW (ref 0.30–0.70)

## 2018-01-04 LAB — TROPONIN I
Troponin I: 3.78 ng/mL (ref <0.03)
Troponin I: 3.1 ng/mL (ref <0.03)

## 2018-01-04 LAB — CBC
HCT: 23.8 % — ABNORMAL LOW (ref 36.0–46.0)
HCT: 27.9 % — ABNORMAL LOW (ref 36.0–46.0)
Hemoglobin: 6.9 g/dL — CL (ref 12.0–15.0)
Hemoglobin: 8.4 g/dL — ABNORMAL LOW (ref 12.0–15.0)
MCH: 24.9 pg — ABNORMAL LOW (ref 26.0–34.0)
MCH: 25.6 pg — ABNORMAL LOW (ref 26.0–34.0)
MCHC: 29 g/dL — ABNORMAL LOW (ref 30.0–36.0)
MCHC: 30.1 g/dL (ref 30.0–36.0)
MCV: 85.9 fL (ref 78.0–100.0)
MCV: 85.1 fL (ref 78.0–100.0)
Platelets: 229 10*3/uL (ref 150–400)
Platelets: 223 10*3/uL (ref 150–400)
RBC: 2.77 MIL/uL — ABNORMAL LOW (ref 3.87–5.11)
RBC: 3.28 MIL/uL — ABNORMAL LOW (ref 3.87–5.11)
RDW: 19 % — ABNORMAL HIGH (ref 11.5–15.5)
RDW: 17.7 % — ABNORMAL HIGH (ref 11.5–15.5)
WBC: 8.5 10*3/uL (ref 4.0–10.5)
WBC: 7.8 10*3/uL (ref 4.0–10.5)

## 2018-01-04 LAB — PREPARE RBC (CROSSMATCH)

## 2018-01-04 LAB — BASIC METABOLIC PANEL
Anion gap: 8 (ref 5–15)
BUN: 67 mg/dL — ABNORMAL HIGH (ref 8–23)
CO2: 14 mmol/L — ABNORMAL LOW (ref 22–32)
Calcium: 8.3 mg/dL — ABNORMAL LOW (ref 8.9–10.3)
Chloride: 117 mmol/L — ABNORMAL HIGH (ref 98–111)
Creatinine, Ser: 2.11 mg/dL — ABNORMAL HIGH (ref 0.44–1.00)
GFR calc Af Amer: 25 mL/min — ABNORMAL LOW (ref >60)
GFR calc non Af Amer: 22 mL/min — ABNORMAL LOW (ref >60)
Glucose, Bld: 172 mg/dL — ABNORMAL HIGH (ref 70–99)
Potassium: 5.6 mmol/L — ABNORMAL HIGH (ref 3.5–5.1)
Sodium: 139 mmol/L (ref 135–145)

## 2018-01-04 MED ORDER — HYDROMORPHONE HCL 2 MG PO TABS
1.0000 mg | ORAL_TABLET | ORAL | Status: DC | PRN
  Administered 2018-01-04 – 2018-01-05 (×2): 1 mg via ORAL
  Filled 2018-01-04 (×2): qty 1

## 2018-01-04 MED ORDER — SODIUM CHLORIDE 0.9% IV SOLUTION: Freq: Once | INTRAVENOUS | Status: DC

## 2018-01-04 MED ORDER — HYDROMORPHONE HCL 1 MG/ML IJ SOLN
0.5000 mg | INTRAMUSCULAR | Status: DC | PRN
  Administered 2018-01-04 – 2018-01-05 (×2): 0.5 mg via INTRAVENOUS
  Filled 2018-01-04 (×2): qty 0.5

## 2018-01-04 MED ORDER — CEPHALEXIN 250 MG PO CAPS
250.0000 mg | ORAL_CAPSULE | Freq: Three times a day (TID) | ORAL | Status: DC
  Administered 2018-01-05 – 2018-01-06 (×2): 250 mg via ORAL
  Filled 2018-01-04 (×2): qty 1

## 2018-01-04 MED ORDER — HEPARIN BOLUS VIA INFUSION
1000.0000 [IU] | Freq: Once | INTRAVENOUS | Status: AC
  Administered 2018-01-04: 1000 [IU] via INTRAVENOUS
  Filled 2018-01-04: qty 1000

## 2018-01-04 MED ORDER — ROSUVASTATIN CALCIUM 20 MG PO TABS
20.0000 mg | ORAL_TABLET | Freq: Every day | ORAL | Status: DC
  Administered 2018-01-04 – 2018-01-05 (×2): 20 mg via ORAL
  Filled 2018-01-04 (×2): qty 1

## 2018-01-04 MED ORDER — ACETAMINOPHEN 500 MG PO TABS
1000.0000 mg | ORAL_TABLET | Freq: Four times a day (QID) | ORAL | Status: DC | PRN
  Administered 2018-01-05 – 2018-01-06 (×4): 1000 mg via ORAL
  Filled 2018-01-04 (×4): qty 2

## 2018-01-04 NOTE — Progress Notes (Signed)
RN attempted to get pt to sit in the chair. PT refused to sit in the chair. PT stated she was too lethargic and in pain. RN educated the pt on the importance of moving and getting up in the chair. Will try again.

## 2018-01-04 NOTE — Progress Notes (Signed)
Pt's leg dressings were changed on both legs per WOC RN orders. Pt tolerated well.

## 2018-01-04 NOTE — Progress Notes (Signed)
MD (BLUM) notified of hemoglobin Results for Emily Miranda, Emily Miranda (MRN [id number]) as of 01/04/2018 08:10  Ref. Range 01/04/2018 06:15  Hemoglobin Latest Ref Range: 12.0 - 15.0 g/dL 6.9 (LL)

## 2018-01-04 NOTE — Progress Notes (Signed)
ANTICOAGULATION CONSULT NOTE   Pharmacy Consult for heparin Indication: chest pain/ACS  Allergies  Allergen Reactions  . Levofloxacin Nausea And Vomiting and Other (See Comments)    Confusion  . Amlodipine Other (See Comments)    Localized edema  . Gabapentin Other (See Comments)    Disoriented, no strength in legs  . Hydrochlorothiazide Other (See Comments)    Hot and disoriented  . Ibuprofen Other (See Comments)    Affected kidneys - stopped flow of urine Reaction to Advil  . Metformin And Related Other (See Comments)    Affected kidneys - stopped flow of urine  . Simvastatin Other (See Comments)    Chest pain    Patient Measurements: Height: 5\' 6"  (167.6 cm) Weight: 145 lb 11.6 oz (66.1 kg) IBW/kg (Calculated) : 59.3 Heparin Dosing Weight: 66 kg   Vital Signs: Temp: 97.9 F (36.6 C) (08/25 0026) Temp Source: Oral (08/25 0026) BP: 110/51 (08/25 0026) Pulse Rate: 72 (08/25 0026)  Labs: Recent Labs    01/01/18 1725 01/02/18 0102 01/02/18 2205 01/03/18 0205  01/03/18 1305 01/03/18 1506 01/03/18 1845 01/03/18 2358  HGB 9.0* 8.2*  --   --   --   --   --   --   --   HCT 30.4* 27.5*  --   --   --   --   --   --   --   PLT 295 258  --   --   --   --   --   --   --   HEPARINUNFRC  --   --   --   --   --   --  0.16*  --  0.16*  CREATININE 2.60* 2.32*  --  1.98*  --   --   --   --   --   CKTOTAL  --   --  156  --   --   --   --   --   --   TROPONINI  --   --   --  7.13*   < > 4.43*  --  4.36* 3.78*   < > = values in this interval not displayed.    Estimated Creatinine Clearance: 23 mL/min (A) (by C-G formula based on SCr of 1.98 mg/dL (H)).   Medical History: Past Medical History:  Diagnosis Date  . AKI (acute kidney injury) (HCC)   . Aortic stenosis, mild   . CAD (coronary artery disease)    a. 09/2016 NSTEMI/Cath: RCA 70p/m, RPDA 95ost-->Med Rx;  b. 10/2016 PCI: RCA 70p/m (2.25 x 15 Resolute Onyx DES), RPDA 95ost (2.0 x 12 Resolute Onyx DES).  . Charcot foot  due to diabetes mellitus (HCC)   . Chronic combined systolic (congestive) and diastolic (congestive) heart failure (HCC)    a. 07/2016 Echo: EF 40-45%, Gr2 DD, mild AS, triv AI, mild MR, mod dil LA.  . Diabetic peripheral neuropathy (HCC)   . Heart murmur   . History of blood transfusion    "related to low HgB"  . History of kidney stones   . History of stomach ulcers   . HTN (hypertension)   . Hyperlipidemia   . Migraine    "maybe once/month" (08/28/2017)  . Myocardial infarction (HCC) 07/2017   while at [facility], s/p cath with DES LAD and CFX  . NSTEMI (non-ST elevated myocardial infarction) (HCC) 10/06/2016  . Pancreatitis    "my dad died w/it at 74 yr old"  . Physical deconditioning  a. W/C bound.  . Pneumonia 10/2016  . Stasis dermatitis of both legs 05/2017  . Type II diabetes mellitus (HCC)     Medications:  Medications Prior to Admission  Medication Sig Dispense Refill Last Dose  . Ascorbic Acid (VITAMIN C) 1000 MG tablet Take 1,000 mg by mouth daily.   week ago  . aspirin EC 81 MG tablet Take 1 tablet (81 mg total) by mouth daily. 150 tablet 2 12/31/2017  . atorvastatin (LIPITOR) 40 MG tablet Take 1 tablet (40 mg total) by mouth daily at 6 PM. (Patient taking differently: Take 40 mg by mouth daily. ) 30 tablet 3 12/31/2017 at am  . carvedilol (COREG) 25 MG tablet Take 1 tablet (25 mg total) by mouth 2 (two) times daily with a meal. 60 tablet 0 12/31/2017 at maybe 1800  . cephALEXin (KEFLEX) 500 MG capsule Take 1 capsule (500 mg total) by mouth 3 (three) times daily. 30 capsule 1 01/01/2018  . clopidogrel (PLAVIX) 75 MG tablet Take 1 tablet (75 mg total) by mouth daily with breakfast. 30 tablet 6 12/31/2017  . collagenase (SANTYL) ointment Apply topically daily. Apply to LE plantar surface daily. (Patient taking differently: Apply 1 application topically See admin instructions. Apply to LE plantar surface daily.) 15 g 0 12/31/2017  . cyclobenzaprine (FLEXERIL) 10 MG tablet Take 10 mg  by mouth 3 (three) times daily.   0 12/31/2017  . diclofenac sodium (VOLTAREN) 1 % GEL Apply 2 g topically 4 (four) times daily. Please apply to let hip. (Patient taking differently: Apply 2 g topically 4 (four) times daily. Please apply to left hip.) 1 Tube 0 12/31/2017  . furosemide (LASIX) 40 MG tablet Take 1 tablet (40 mg total) by mouth daily. 30 tablet 5 unk  . glimepiride (AMARYL) 4 MG tablet Take 2-4 mg by mouth See admin instructions. Take 1 tablet by mouth every morning, and 1/2 tablet every evening  0 unk  . insulin glargine (LANTUS) 100 UNIT/ML injection Inject 0.12 mLs (12 Units total) into the skin at bedtime. 10 mL 11 unk  . pantoprazole (PROTONIX) 40 MG tablet Take 1 tablet (40 mg total) by mouth daily. 90 tablet 1 Taking  . fexofenadine (ALLEGRA) 180 MG tablet Take 180 mg by mouth daily.   unk  . fluticasone (FLONASE) 50 MCG/ACT nasal spray Place 1 spray into both nostrils daily as needed (seasonal allergies).    unk  . HUMALOG KWIKPEN 100 UNIT/ML KiwkPen Inject 2 Units into the skin daily with supper.  3 Not Taking at Unknown time  . hydrALAZINE (APRESOLINE) 25 MG tablet Take 25 mg by mouth every 8 (eight) hours.   Not Taking at Unknown time  . HYDROcodone-acetaminophen (NORCO/VICODIN) 5-325 MG tablet Take 1 tablet by mouth every 6 (six) hours as needed for moderate pain. (Patient not taking: Reported on 01/03/2018) 10 tablet 0 Not Taking at Unknown time  . isosorbide mononitrate (IMDUR) 30 MG 24 hr tablet Take 1 tablet (30 mg total) by mouth daily. (Patient not taking: Reported on 01/03/2018) 30 tablet 6 Not Taking at Unknown time  . linagliptin (TRADJENTA) 5 MG TABS tablet Take 5 mg by mouth daily.   Not Taking at Unknown time  . mupirocin cream (BACTROBAN) 2 % Apply 1 application topically 2 (two) times daily. (Patient not taking: Reported on 01/03/2018) 15 g 0 Not Taking at Unknown time  . nitroGLYCERIN (NITROSTAT) 0.4 MG SL tablet Place 1 tablet (0.4 mg total) under the tongue every 5  (five)  minutes x 3 doses as needed for chest pain. 25 tablet 3 unk  . Polyethyl Glycol-Propyl Glycol (SYSTANE OP) Place 1 drop into both eyes daily as needed (dry eyes).   unk  . polyethylene glycol (MIRALAX / GLYCOLAX) packet Take 17 g by mouth daily as needed (constipation). Mix in 8 oz liquid and drink   unk  . silver sulfADIAZINE (SILVADENE) 1 % cream Apply topically daily. Apply to right lower extremities affected areas, not to the plantar region (Patient not taking: Reported on 01/03/2018) 50 g 0 Not Taking at Unknown time    Assessment: 33 YOF who presented with a septic join on antibiotics now with new chest pain. Pharmacy consulted to dose IV heparin for ACS. Repeat HL remains subtherapeutic at 0.16 despite dose increase. No bleeding per RN  Goal of Therapy:  Heparin level 0.3-0.7 units/ml Monitor platelets by anticoagulation protocol: Yes   Plan:  -heparin bolus 1000 units x1 then increase to 1150 units/hr -Heparin level in 8 hours and daily wth CBC daily  Vinnie Level, PharmD., BCPS Clinical Pharmacist Clinical phone for 01/04/18 until 8AM: [phone]

## 2018-01-04 NOTE — Progress Notes (Addendum)
   Subjective: [name] is feeling weak and tired today with some shortness of breath. She was able to eat breakfast. She has no further chest pain. Her left groin pain has returned today. She had nosebleeds throughout the day yesterday and was found to have a hemoglobin of 6.9 this morning. I spoke with Emily Miranda daughter Emily Miranda over the phone today, she asked for a general update on the hospital course. She also disclosed that Emily Miranda has had hip girdle pain for weeks before having a more focal left hip pain.    Objective:  Vital signs in last 24 hours: Vitals:   01/04/18 1008 01/04/18 1035 01/04/18 1349 01/04/18 1403  BP: (!) 141/64 (!) 142/71 (!) 132/53 (!) 126/55  Pulse: 77 60 61 61  Resp: 16 12 16 19   Temp: 98.1 F (36.7 C) 98 F (36.7 C) 98.3 F (36.8 C) 98.5 F (36.9 C)  TempSrc: Oral Axillary Oral Oral  SpO2: 97% 98% 97% 98%  Weight:      Height:       General: no acute distress, chronically ill appearing  Cardiac: regular rate and rhythm Pulm: normal work of breathing, lungs clear to auscultation  GI: abdomen is soft, non tender, non distended  Skin: right lower extremity has improvement in erythema, warmth and swellling   Assessment/Plan:  Active Problems:   DM type 2 causing CKD stage 3 (HCC)   Charcot foot due to diabetes mellitus (HCC)   Chronic combined systolic and diastolic heart failure (HCC)   Stasis dermatitis of both legs  Fever 2/2 right lower leg cellulitis vs myositis   - no further fevers since starting ceftriaxone, right leg continues to have improvement in swelling and warmth  - transition to keflex   Right hip pain  Myositis  - start dilaudid po 1 mg q2h PRN  - continue dilaudid 0.5mg  q2h PRN, this should be used for breakthrough - switch from atorvastatin to rosuvasatin at high intensity, CK is not diagnostic of myositis but rosuvastatin has less of developing that   Acute on chronic anemia  - hgb trended down from 9 on  admission to 8 yesterday to 7 today, she says that she continued to have nose bleeds overnight  - transfuse 1 unit PRBC  - check post transfusion H&H and transfuse to goal hgb of > 8 given her coronary artery disease  - hold off on further heparin gtt  NSTEMI with history of CAD - chest pain has remained resolved since the time of its resolution after receiving nitro  - trop has continued to trend down  - stop heparin gtt as mentioned above - continue clopidogrel, carvedilol, asa   - will attempt to add on beta blocker therapy when hemodynamics are stable   - switch from atorvastatin to rosuvastatin as mentioned above   Acute kidney injury  - creatinine has continued to improve  - avoid nephrotoxic medications   Dispo: Anticipated discharge in approximately 2 day(s).   Emily Pont, MD 01/04/2018, 2:05 PM Pager: [phone]

## 2018-01-04 NOTE — Progress Notes (Signed)
Progress Note  Patient Name: Emily Miranda Date of Encounter: 01/04/2018  Primary Cardiologist: Rollene Rotunda, MD   Subjective   No recurrent chest pain, no SOB. Has just genralized fatigue.   Inpatient Medications    Scheduled Meds: . sodium chloride   Intravenous Once  . aspirin  81 mg Oral Daily  . atorvastatin  40 mg Oral q1800  . carvedilol  25 mg Oral BID WC  . clopidogrel  75 mg Oral Q breakfast  . clotrimazole   Topical BID  . hydrALAZINE  25 mg Oral Q8H  . insulin aspart  0-5 Units Subcutaneous QHS  . insulin aspart  0-9 Units Subcutaneous TID WC  . insulin glargine  12 Units Subcutaneous QHS  . isosorbide mononitrate  30 mg Oral Daily  . pantoprazole  40 mg Oral Daily   Continuous Infusions: . cefTRIAXone (ROCEPHIN)  IV 1 g (01/04/18 0033)   PRN Meds: HYDROcodone-acetaminophen, HYDROmorphone (DILAUDID) injection   Vital Signs    Vitals:   01/03/18 2006 01/04/18 0026 01/04/18 0426 01/04/18 0747  BP: (!) 122/55 (!) 110/51 (!) 128/55 (!) 140/48  Pulse: 73 72 62 65  Resp:    19  Temp: 98.2 F (36.8 C) 97.9 F (36.6 C)  98.9 F (37.2 C)  TempSrc: Oral Oral  Axillary  SpO2: 96% 100% 95% 96%  Weight:      Height:        Intake/Output Summary (Last 24 hours) at 01/04/2018 0914 Last data filed at 01/04/2018 0033 Gross per 24 hour  Intake 926.97 ml  Output 400 ml  Net 526.97 ml   Filed Weights   01/02/18 0010 01/02/18 0500 01/03/18 0500  Weight: 66.3 kg 65.3 kg 66.1 kg    Telemetry    SR - Personally Reviewed  ECG    na  Physical Exam   GEN: No acute distress.   Neck: No JVD Cardiac: RRR, 3/6 systolic murmur rusb, no, rubs, or gallops.  Respiratory: Clear to auscultation bilaterally. GI: Soft, nontender, non-distended  MS: No edema; No deformity. Neuro:  Nonfocal  Psych: Normal affect   Labs    Chemistry Recent Labs  Lab 01/01/18 1725 01/02/18 0102 01/03/18 0205  NA 136 136 140  K 5.6* 4.9 4.7  CL 113* 117* 120*  CO2  15* 13* 14*  GLUCOSE 302* 267* 140*  BUN 89* 80* 72*  CREATININE 2.60* 2.32* 1.98*  CALCIUM 9.0 8.6* 8.5*  PROT 7.0  --   --   ALBUMIN 2.4*  --   --   AST 14*  --   --   ALT 17  --   --   ALKPHOS 114  --   --   BILITOT 0.4  --   --   GFRNONAA 17* 19* 24*  GFRAA 20* 23* 27*  ANIONGAP 8 6 6      Hematology Recent Labs  Lab 01/01/18 1725 01/02/18 0102 01/04/18 0615  WBC 15.7* 17.5* 8.5  RBC 3.54* 3.25* 2.77*  HGB 9.0* 8.2* 6.9*  HCT 30.4* 27.5* 23.8*  MCV 85.9 84.6 85.9  MCH 25.4* 25.2* 24.9*  MCHC 29.6* 29.8* 29.0*  RDW 18.4* 18.5* 19.0*  PLT 295 258 229    Cardiac Enzymes Recent Labs  Lab 01/03/18 1305 01/03/18 1845 01/03/18 2358 01/04/18 0615  TROPONINI 4.43* 4.36* 3.78* 3.10*   No results for input(s): TROPIPOC in the last 168 hours.   BNPNo results for input(s): BNP, PROBNP in the last 168 hours.   DDimer No  results for input(s): DDIMER in the last 168 hours.   Radiology    Mr Hip Left Wo Contrast  Result Date: 01/02/2018 CLINICAL DATA:  Two week history of left-sided groin pain. Rule out septic arthritis. EXAM: MR OF THE LEFT HIP WITHOUT CONTRAST TECHNIQUE: Multiplanar, multisequence MR imaging was performed. No intravenous contrast was administered. COMPARISON:  None. FINDINGS: Bones: No findings of septic arthritis of either hip. No marrow signal abnormality about either side of the hip joints. Articular cartilage and labrum Articular cartilage:  No focal chondral defect. Labrum: Mild degeneration of the anterior superior labrum without definite tear given lack of joint distention. Joint or bursal effusion Joint effusion: Trace right hip joint effusion. Physiologic amount of fluid in the left hip joint. Bursae: Greater trochanteric bursal fluid bilaterally. Muscles and tendons Muscles and tendons: Intramuscular edema noted of the pectineus, obturator externus, adductor and gluteal medius muscles. Findings likely represent myositis potentially intramuscular  strains. Other findings Miscellaneous: Diffuse soft tissue edema about the pelvis and both hips. Small focus of fluid intramuscular fluid of the right thigh measuring 3.3 x 1.6 cm. There is degenerative disc disease of the included lower lumbar spine from inferior endplate of L2 through S1. Intact sacrum, sacroiliac joints and pubic symphysis. IMPRESSION: No MR findings of septic arthritis. No abnormal marrow edema is seen about both sides of either hip joint. Bilateral greater trochanteric bursitis. Diffuse soft tissue edema about the pelvis and both hips with intramuscular edema likely representing areas of myositis as above. Muscle strains are a possibility as well. Electronically Signed   By: Tollie Eth M.D.   On: 01/02/2018 21:41   Dg Fluoro Guided Needle Plc Aspiration/injection Loc  Result Date: 01/02/2018 CLINICAL DATA:  Severe left hip pain and leukocytosis. Concern for septic arthritis. FLUOROSCOPY TIME:  Radiation Exposure Index (as provided by the fluoroscopic device): 0.9 mGy Fluoroscopy Time:  12 seconds Number of Acquired Images:  0 PROCEDURE: The risks and benefits of the procedure were discussed with the patient, and written informed consent was obtained. The patient stated no history of allergy to contrast media. A formal timeout procedure was performed with the patient according to departmental protocol. The patient was placed supine on the fluoroscopy table and the left hip joint was identified under fluoroscopy. The skin overlying the left hip joint was subsequently cleaned with Betadine and a sterile drape was placed over the area of interest. 2 ml 1% Lidocaine was used to anesthetize the skin around the needle insertion site. A 3.5 inch 20 gauge spinal needle was inserted into the left hip joint under fluoroscopy. Initial aspiration yielded no fluid. Therefore, 5 mL of sterile saline were injected into the joint. 2.5 mL of fluid were re-aspirated and sent to the lab for further analysis.  The needle was removed and hemostasis was achieved. The patient tolerated the procedure well without complication. IMPRESSION: Technically successful left hip aspiration. Electronically Signed   By: Obie Dredge M.D.   On: 01/02/2018 18:42    Cardiac Studies     Patient Profile     Emily Miranda is a 77 y.o. female with a hx of  CAD withmultiple PCIs, last 07/2017 w/ DES LAD, LCX, mod AS/AI, HFrEF (EF 40-45%), CKD III, DM, HTN, HLD, pulm HTN, R foot Charcot's deformity, PAD, anemia who is being seen today for the evaluation of elevated troponin at the request of Dr. Crista Elliot.  Assessment & Plan    1. CAD/NSTEMI - history of CAD with multiple PCIs.  Last 07/2017 w/ DES LAD, LCX - 09/2017 cath LM patent, mid LAD 70% proximal and distal to existing stent, patent LCX, RCA diffuse distal disease - episode of chest pain early AM yesterday, cardiology was consulted.   Trop 7-->5.16-->4.43-->4.36-->3.78-->3.10 - EKG SR lateral TWI, anterior TWIs  - medical therapy with ASA, atorva 40, plavix 75. No ACE/ARB due to renal dysfunction. Consider low dose beta blocker pending hemodynamics with her acute Hgb drop.  - cath limited at this time due to AKI but also significant drop in Hgb this AM to 6.8 on heparin. Any intervention would create higher risk than the present if she had to stop DAPT early.  Would transfuse to Hgb 9-10, stop hep gtt. Anemia workup per primary team. Hold on cath at this time, continue ASA and plavix if Hgb remains stable. Baseline Hgb 8 from 2 months ago.  - monitor fluid status with transfusions.   2. Aortic stenosis - 09/2017 echo LVEF 45-50%, moderate AS (mean grad 26, AVA VTI 0.85,dimensionless index 0.33) , mod AI   3. Fever and left hip pain - primary complaint on admission.  - s/p arthrocentesis, from notes not consistent with septic arthritis. On abx per primary team  4. Anemia - Hgb 6.9, down from 8.2   5. Charcot's foot - chronic, planned for AKI in the  future  6. AKI on CKD - baseline appears to be 1.7 to 1.9. 2.6 on admission, trending down to 1.98 today.     For questions or updates, please contact [facility] Please consult [email or web address] for contact info under Cardiology/STEMI.      [name], MD  01/04/2018, 9:14 AM

## 2018-01-04 NOTE — Consult Note (Signed)
WOC Nurse wound consult note Reason for Consult:Management of bilateral LEs.  Patient is known to our department from previous admissions and was last seen by this writer on 10/04/17.  Her wounds, particularly those on the left LE, are improved since that assessment. She has been seeing the MD at the [facility] clinic nearly every week and has had HHRN twice weekly until recently.  She reports that [facility] has stopped coming despite MD Orders. On days when she does not have HHRN or travel to [facility], patient and husband struggle to change dressings. Patient is followed by the outpatient Dermatology Clinic at [facility]) and is scheduled to have an amputation of the RLE on Thursday, 01/08/18.  Wound type: Venous and arterial insufficiency Pressure Injury POA: N/A Measurement: Right inferior anterior LE wound: 3.8cm x 2.4cm x 0. 4cm  (80% pink, 20% yellow, small to moderate serous to light yellow drainage on old dressing), mild odor Right foot medial aspect: 1.5cm x 2.5cm x 0.4cm with 80% pink, 20% yellow wound bed.  Small to moderate exudate, mild odor. Left great toe: 2.2cm x 1.2cm x 0.2cm with 50% eschar and 50% red wound bed. No drainage. Left foot, 4th digit: 1cm round x 0.1cm dark wound bed with dried serum. No exudate. Wound bed: Drainage (amount, consistency, odor) As described above Periwound: hemosiderin staining, dry peeling skin on the left LE Dressing procedure/placement/frequency: Conservative daily wound care orders are provided that are consistent with those in place by [facility] outpatient WCC. Patient reports being "ready" for RLE amputation and hopes to be cleared by cardiology to proceed.  WOC nursing team will not follow, but will remain available to this patient, the nursing and medical teams.  Please re-consult if needed. Thanks, Ladona Mow, MSN, RN, GNP, [place]  Pager# [phone]

## 2018-01-05 DIAGNOSIS — N183 Chronic kidney disease, stage 3 (moderate): Secondary | ICD-10-CM

## 2018-01-05 DIAGNOSIS — I21A1 Myocardial infarction type 2: Secondary | ICD-10-CM

## 2018-01-05 DIAGNOSIS — E11610 Type 2 diabetes mellitus with diabetic neuropathic arthropathy: Secondary | ICD-10-CM

## 2018-01-05 DIAGNOSIS — M7062 Trochanteric bursitis, left hip: Principal | ICD-10-CM

## 2018-01-05 DIAGNOSIS — M7061 Trochanteric bursitis, right hip: Secondary | ICD-10-CM

## 2018-01-05 DIAGNOSIS — N200 Calculus of kidney: Secondary | ICD-10-CM

## 2018-01-05 DIAGNOSIS — L03115 Cellulitis of right lower limb: Secondary | ICD-10-CM

## 2018-01-05 DIAGNOSIS — I110 Hypertensive heart disease with heart failure: Secondary | ICD-10-CM

## 2018-01-05 DIAGNOSIS — I5042 Chronic combined systolic (congestive) and diastolic (congestive) heart failure: Secondary | ICD-10-CM

## 2018-01-05 DIAGNOSIS — E1122 Type 2 diabetes mellitus with diabetic chronic kidney disease: Secondary | ICD-10-CM

## 2018-01-05 DIAGNOSIS — Z9889 Other specified postprocedural states: Secondary | ICD-10-CM

## 2018-01-05 DIAGNOSIS — D649 Anemia, unspecified: Secondary | ICD-10-CM

## 2018-01-05 LAB — BASIC METABOLIC PANEL
Anion gap: 7 (ref 5–15)
BUN: 63 mg/dL — ABNORMAL HIGH (ref 8–23)
CO2: 14 mmol/L — ABNORMAL LOW (ref 22–32)
Calcium: 8.2 mg/dL — ABNORMAL LOW (ref 8.9–10.3)
Chloride: 118 mmol/L — ABNORMAL HIGH (ref 98–111)
Creatinine, Ser: 2.02 mg/dL — ABNORMAL HIGH (ref 0.44–1.00)
GFR calc Af Amer: 27 mL/min — ABNORMAL LOW (ref >60)
GFR calc non Af Amer: 23 mL/min — ABNORMAL LOW (ref >60)
Glucose, Bld: 318 mg/dL — ABNORMAL HIGH (ref 70–99)
Potassium: 5.5 mmol/L — ABNORMAL HIGH (ref 3.5–5.1)
Sodium: 139 mmol/L (ref 135–145)

## 2018-01-05 LAB — CBC
HCT: 31.6 % — ABNORMAL LOW (ref 36.0–46.0)
Hemoglobin: 9.8 g/dL — ABNORMAL LOW (ref 12.0–15.0)
MCH: 26.1 pg (ref 26.0–34.0)
MCHC: 31 g/dL (ref 30.0–36.0)
MCV: 84.3 fL (ref 78.0–100.0)
Platelets: 230 10*3/uL (ref 150–400)
RBC: 3.75 MIL/uL — ABNORMAL LOW (ref 3.87–5.11)
RDW: 17.2 % — ABNORMAL HIGH (ref 11.5–15.5)
WBC: 8.1 10*3/uL (ref 4.0–10.5)

## 2018-01-05 LAB — HEPATIC FUNCTION PANEL
ALT: 11 U/L (ref 0–44)
AST: 20 U/L (ref 15–41)
Albumin: 1.8 g/dL — ABNORMAL LOW (ref 3.5–5.0)
Alkaline Phosphatase: 78 U/L (ref 38–126)
Bilirubin, Direct: 0.2 mg/dL (ref 0.0–0.2)
Indirect Bilirubin: 0.1 mg/dL — ABNORMAL LOW (ref 0.3–0.9)
Total Bilirubin: 0.3 mg/dL (ref 0.3–1.2)
Total Protein: 5.6 g/dL — ABNORMAL LOW (ref 6.5–8.1)

## 2018-01-05 LAB — GLUCOSE, CAPILLARY
Glucose-Capillary: 239 mg/dL — ABNORMAL HIGH (ref 70–99)
Glucose-Capillary: 246 mg/dL — ABNORMAL HIGH (ref 70–99)
Glucose-Capillary: 266 mg/dL — ABNORMAL HIGH (ref 70–99)
Glucose-Capillary: 347 mg/dL — ABNORMAL HIGH (ref 70–99)

## 2018-01-05 MED ORDER — CLOPIDOGREL BISULFATE 75 MG PO TABS
75.0000 mg | ORAL_TABLET | Freq: Every day | ORAL | Status: DC
  Administered 2018-01-05 – 2018-01-06 (×2): 75 mg via ORAL
  Filled 2018-01-05 (×2): qty 1

## 2018-01-05 MED ORDER — SODIUM CHLORIDE 0.9 % IV SOLN
INTRAVENOUS | Status: DC
  Administered 2018-01-05: 11:00:00 via INTRAVENOUS
  Administered 2018-01-06: 1000 mL via INTRAVENOUS

## 2018-01-05 MED ORDER — OXYCODONE HCL 5 MG PO TABS
5.0000 mg | ORAL_TABLET | Freq: Once | ORAL | Status: AC
  Administered 2018-01-05: 5 mg via ORAL
  Filled 2018-01-05: qty 1

## 2018-01-05 NOTE — Progress Notes (Signed)
OT Cancellation Note  Patient Details Name: Emily Miranda MRN: [id number] DOB: 13-Dec-1940   Cancelled Treatment:    Reason Eval/Treat Not Completed: Patient declined, no reason specified  Upon arrival, pt reports "I don't need therapy".  When encouraged to participate, pt states having just received pain meds and getting comfortable and therefore declining any therapy at this time.  Will follow as schedule and pt allows.  Rosalio Loud 01/05/2018, 2:18 PM

## 2018-01-05 NOTE — Progress Notes (Signed)
Inpatient Diabetes Program Recommendations  AACE/ADA: New Consensus Statement on Inpatient Glycemic Control (2015)  Target Ranges:  Prepandial:   less than 140 mg/dL      Peak postprandial:   less than 180 mg/dL (1-2 hours)      Critically ill patients:  140 - 180 mg/dL   Lab Results  Component Value Date   GLUCAP 246 (H) 01/05/2018   HGBA1C 11.8 (H) 05/29/2017    Review of Glycemic Control Results for Emily Miranda, MIRACLE (MRN [id number]) as of 01/05/2018 14:15  Ref. Range 01/04/2018 11:22 01/04/2018 16:43 01/04/2018 21:48 01/05/2018 06:42 01/05/2018 12:13  Glucose-Capillary Latest Ref Range: 70 - 99 mg/dL 268 (H) 341 (H) 962 (H) 239 (H) 246 (H)   Diabetes history: Type 2 DM  Outpatient Diabetes medications:  Amaryl 4 mg AM and 2 mg PM, Humalog 2 units with supper, Lantus 12 units q HS Current orders for Inpatient glycemic control:  Novolog sensitive tid with meals, Lantus 12 units q HS Inpatient Diabetes Program Recommendations:   May consider increasing Lantus to 16 units q HS.    Thanks,  Beryl Meager, RN, BC-ADM Inpatient Diabetes Coordinator Pager [phone]

## 2018-01-05 NOTE — Care Management Important Message (Signed)
Important Message  Patient Details  Name: TANEIL LAWTER MRN: [id number] Date of Birth: 1941-03-28   Medicare Important Message Given:  Yes    Dorena Bodo 01/05/2018, 3:56 PM

## 2018-01-05 NOTE — Progress Notes (Signed)
Dressing change preformed as ordered. Tolerated well. Will continue to monitor.

## 2018-01-05 NOTE — Progress Notes (Signed)
Subjective: Patient was doing okay, no acute events overnight. She reports that she is still having pain in her left hip, states that it's a bit better compared to when she came in. She reported that the pain improved after getting the blood yesterday but that it returned. She also reported that her right medial thigh started hurting today, reports that it's a constant pain. She reports that she had a short episode of chest pain early this morning, that only lasted about 10 seconds but did feel like a crushing pain. We discussed the plan with her and she is in agreement.   Objective:  Vital signs in last 24 hours: Vitals:   01/05/18 0030 01/05/18 0130 01/05/18 0230 01/05/18 0448  BP: (!) 142/55 (!) 158/60 (!) 141/64 (!) 180/84  Pulse: 62 66 68 62  Resp: 20 20  (!) 21  Temp: 98.7 F (37.1 C) 98.7 F (37.1 C) 98.6 F (37 C) 98.8 F (37.1 C)  TempSrc: Oral Oral Oral Oral  SpO2: 97% 96%  97%  Weight:      Height:        General: Chronically ill appearing, NAD  Cardiac: RRR, normal S1, S2, no murmurs, rubs or gallops Pulmonary: Lungs CTA bilaterally, no wheezing, rhonchi or rales Extremity: Tenderness to palpation over left hip, no erythema or edema noted, tenderness to palpation over medial left thigh, no erythema or rash noted. Right lower extremity wrapped in bandage, equally warm on both legs, edema and erythema improving, non-tender, charcots foot,  Psychiatry: Normal mood and affect     Assessment/Plan: This is a 77 year old female with a PMH of CAD (s/p stenting), HLD, HTN, combined systolic and diastolic heart failure, DM,who presented with a a 2 week history of left sided pain.  Fever:  -Could be 2/2 cellulitis in her right lower extremity, side of the charcots foot, or possible infection of the left hip -Switched from ceftriaxone to keflex yesterday -Patient has been afebrile since she has been here -Continue keflex   Left hip pain: -MRI showed bilateral trochanteric  bursitis, diffuse soft tissue edema around pelvis and both hips likely representing myositis vs muscle strains -atorvastatin was switched to rosuvastatin yesterday for a lower risk of myositis, CK was negative which should make myositis less likely -She started having right medial thigh pain today, tender to palpation, no erythema or rash noted, possibly could be a myositis in this area however CK was normal -Hip aspiration showed no evidence of septic arthritis, or crystals -Abdominal ultrasound showed non-obstructing left renal stone, unlikely that a kidney stone is the source of pain -Continue dilaudid for pain -Ambulate out of bed,  -PT/OT  NSTEMI: -7.13 > 5.16 > 4.43, EKG that showed no changes from previous EKG and no ST changes. -Heparin drip was discontinued yesterday due to acute on chronic anemia, will consider restarting today since she continues to have some chest pain -Chest pain occurred again this morning, lasted for about 10 seconds, given the intermittent episodes it could be vasopastic angina or demand ischemia, cardiology on board, appreciate recs -Cardiology: Hold on cath due to AKI and anemia, continue ASA and plavix if hgb is stable -Continue atorvastatin and carvedilol -Continue ASA 81mg , continue clopidogrel  Anemia: -S/p 2 units pRBC yesterday -Goal is 9-10 per cardiology -Today hgb was 9.8 -Continue to check CBCs  FEN: No fluids, replete lytes prn, heart healthy diet  VTE ppx:  Code Status: DNR    Dispo: Anticipated discharge in approximately 2-3  day(s).   Claudean Severance, MD 01/05/2018, 6:39 AM Pager: [phone]

## 2018-01-05 NOTE — Progress Notes (Signed)
Physical Therapy Treatment Patient Details Name: Emily Miranda MRN: [id number] DOB: 08-10-1940 Today's Date: 01/05/2018    History of Present Illness Emily Miranda is a 77 y/o female with PMHx of CAD with multiple stents, CKD stage III, DM, HTN, HLD, combined systolic and diastolic heart failure presents with 2 week history of progressively worsening left flank pain. Also complains of associated low-grade fever, headache and nausea. She has history of severe bilateral stasis dermatitis and Charcot foot on the right. She is followed by [facility] with planned right AKA next week    PT Comments    Patient seen for mobility progression and agreeable to OOB in chair. Pt requires +2 for safety with functional transfers. Pt with c/o pain in L hip, R groin, and feet. Continue to progress as tolerated with anticipated d/c to SNF for further skilled PT services.     Follow Up Recommendations  SNF     Equipment Recommendations  None recommended by PT    Recommendations for Other Services OT consult     Precautions / Restrictions Precautions Precautions: Fall    Mobility  Bed Mobility Overal bed mobility: Needs Assistance Bed Mobility: Supine to Sit     Supine to sit: Min assist     General bed mobility comments: assistance required to elevate trunk into sitting; use of rail   Transfers Overall transfer level: Needs assistance Equipment used: 2 person hand held assist Transfers: Stand Pivot Transfers;Sit to/from Stand Sit to Stand: Min assist;+2 physical assistance Stand pivot transfers: +2 physical assistance;Min assist       General transfer comment: assistance required for balance and safety  Ambulation/Gait             General Gait Details: deferred   Stairs             Wheelchair Mobility    Modified Rankin (Stroke Patients Only)       Balance Overall balance assessment: Needs assistance Sitting-balance support: Bilateral upper extremity  supported Sitting balance-Leahy Scale: Fair       Standing balance-Leahy Scale: Poor                              Cognition Arousal/Alertness: Awake/alert Behavior During Therapy: WFL for tasks assessed/performed Overall Cognitive Status: Within Functional Limits for tasks assessed                                        Exercises      General Comments General comments (skin integrity, edema, etc.): blood noted on bottom of R foot bandage      Pertinent Vitals/Pain Pain Assessment: Faces Faces Pain Scale: Hurts even more Pain Location: L hip, R groin, and feet Pain Descriptors / Indicators: Grimacing;Constant;Guarding;Sore Pain Intervention(s): Limited activity within patient's tolerance;Monitored during session;Repositioned;Patient requesting pain meds-RN notified    Home Living                      Prior Function            PT Goals (current goals can now be found in the care plan section) Acute Rehab PT Goals Patient Stated Goal: improve pain PT Goal Formulation: With patient Time For Goal Achievement: 01/16/18 Potential to Achieve Goals: Fair Progress towards PT goals: Progressing toward goals    Frequency    Min 2X/week  PT Plan Current plan remains appropriate    Co-evaluation PT/OT/SLP Co-Evaluation/Treatment: Yes            AM-PAC PT "6 Clicks" Daily Activity  Outcome Measure  Difficulty turning over in bed (including adjusting bedclothes, sheets and blankets)?: A Lot Difficulty moving from lying on back to sitting on the side of the bed? : Unable Difficulty sitting down on and standing up from a chair with arms (e.g., wheelchair, bedside commode, etc,.)?: Unable Help needed moving to and from a bed to chair (including a wheelchair)?: A Lot Help needed walking in hospital room?: Total Help needed climbing 3-5 steps with a railing? : Total 6 Click Score: 8    End of Session Equipment Utilized  During Treatment: Gait belt Activity Tolerance: Patient tolerated treatment well Patient left: in chair;with call bell/phone within reach Nurse Communication: Mobility status PT Visit Diagnosis: Unsteadiness on feet (R26.81);Other abnormalities of gait and mobility (R26.89);Muscle weakness (generalized) (M62.81)     Time: 5374-8270 PT Time Calculation (min) (ACUTE ONLY): 24 min  Charges:  $Therapeutic Activity: 23-37 mins                     Erline Levine, PTA Pager: [phone]     Carolynne Edouard 01/05/2018, 4:56 PM

## 2018-01-05 NOTE — Progress Notes (Signed)
Internal Medicine Attending:   I saw and examined the patient. I reviewed the resident's note and I agree with the resident's findings and plan as documented in the resident's note.  Patient is drowsy this afternoon, having difficulty talking with me, falling asleep.  I think we can discontinue the IV Dilaudid at this point, her pain is much better controlled.  Fevers are resolved, on antibiotics for cellulitis which is fine to continue.  She had a type II NSTEMI 2 days ago, no residual chest pain, on medical management for small vessel atherosclerotic disease.  We need to mobilize her at this point, out of bed to the chair, PT recommending SNF level rehab which I think is appropriate.

## 2018-01-05 NOTE — Plan of Care (Signed)
  Problem: Education: Goal: Knowledge of General Education information will improve Description Including pain rating scale, medication(s)/side effects and non-pharmacologic comfort measures Outcome: Progressing   Problem: Health Behavior/Discharge Planning: Goal: Ability to manage health-related needs will improve Outcome: Progressing   Problem: Clinical Measurements: Goal: Ability to maintain clinical measurements within normal limits will improve Outcome: Progressing Goal: Will remain free from infection Outcome: Progressing Goal: Diagnostic test results will improve Outcome: Progressing Goal: Respiratory complications will improve Outcome: Progressing Goal: Cardiovascular complication will be avoided Outcome: Progressing   Problem: Activity: Goal: Risk for activity intolerance will decrease Outcome: Progressing   Problem: Nutrition: Goal: Adequate nutrition will be maintained Outcome: Progressing   Problem: Coping: Goal: Level of anxiety will decrease Outcome: Progressing   Problem: Elimination: Goal: Will not experience complications related to bowel motility Outcome: Progressing Goal: Will not experience complications related to urinary retention Outcome: Progressing   Problem: Pain Managment: Goal: General experience of comfort will improve Outcome: Progressing   Problem: Safety: Goal: Ability to remain free from injury will improve Outcome: Progressing   Problem: Skin Integrity: Goal: Risk for impaired skin integrity will decrease Outcome: Progressing   Problem: Activity: Goal: Will identify at least one activity in which they can participate Outcome: Progressing   Problem: Coping: Goal: Ability to identify and develop effective coping behavior will improve Outcome: Progressing Goal: Ability to interact with others will improve Outcome: Progressing Goal: Demonstration of participation in decision-making regarding own care will improve Outcome:  Progressing Goal: Ability to use eye contact when communicating with others will improve Outcome: Progressing   Problem: Self-Concept: Goal: Will verbalize positive feelings about self Outcome: Progressing

## 2018-01-05 NOTE — Progress Notes (Deleted)
Subjective: Patient resting comfortably in bed. Minimally communicative. Daughter in room to discuss injury and treatment   Objective: Vital signs in last 24 hours: Temp:  [98 F (36.7 C)-99.2 F (37.3 C)] 98.8 F (37.1 C) (08/26 0448) Pulse Rate:  [55-78] 62 (08/26 0448) Resp:  [12-22] 21 (08/26 0448) BP: (126-180)/(48-84) 180/84 (08/26 0448) SpO2:  [96 %-98 %] 97 % (08/26 0448)  Intake/Output from previous day: 08/25 0701 - 08/26 0700 In: 1568.3 [P.O.:180; Blood:1215; IV Piggyback:173.3] Out: -  Intake/Output this shift: No intake/output data recorded.  Recent Labs    01/04/18 0615 01/04/18 1535 01/05/18 0412  HGB 6.9* 8.4* 9.8*   Recent Labs    01/04/18 1535 01/05/18 0412  WBC 7.8 8.1  RBC 3.28* 3.75*  HCT 27.9* 31.6*  PLT 223 230   Recent Labs    01/04/18 1535 01/05/18 0412  NA 139 139  K 5.6* 5.5*  CL 117* 118*  CO2 14* 14*  BUN 67* 63*  CREATININE 2.11* 2.02*  GLUCOSE 172* 318*  CALCIUM 8.3* 8.2*   No results for input(s): LABPT, INR in the last 72 hours.  Right hip tender to palpation. PAin on any attempted range of motion; no deformity  X-ray- Minimally displaced right femoral neck fracture   Assessment/Plan: Femoral neck fracture- Patient has a minimally displaced fracture with significant dementia. Will plan on in situ pinning of the fracture as it is preferred treatment in this circumstance. Discussed in detail with patients daughter who agrees with the plan   Ollen Gross 01/05/2018, 7:02 AM

## 2018-01-05 NOTE — Progress Notes (Signed)
Progress Note  Patient Name: Emily Miranda Date of Encounter: 01/05/2018  Primary Cardiologist: Rollene Rotunda, MD   Subjective   No cardiac complaints.  Looking forward to having right lower extremity above-the-knee amputation at [facility].  Has follow-up consult meeting this coming Thursday.  She denies shortness of breath and chest discomfort.  Some left hip discomfort has been noted.  Inpatient Medications    Scheduled Meds: . aspirin  81 mg Oral Daily  . carvedilol  25 mg Oral BID WC  . cephALEXin  250 mg Oral Q8H  . clopidogrel  75 mg Oral Q breakfast  . clotrimazole   Topical BID  . hydrALAZINE  25 mg Oral Q8H  . insulin aspart  0-5 Units Subcutaneous QHS  . insulin aspart  0-9 Units Subcutaneous TID WC  . insulin glargine  12 Units Subcutaneous QHS  . isosorbide mononitrate  30 mg Oral Daily  . pantoprazole  40 mg Oral Daily  . rosuvastatin  20 mg Oral QHS   Continuous Infusions: . sodium chloride 75 mL/hr at 01/05/18 1056   PRN Meds: acetaminophen, HYDROmorphone (DILAUDID) injection, HYDROmorphone   Vital Signs    Vitals:   01/05/18 0130 01/05/18 0230 01/05/18 0448 01/05/18 0854  BP: (!) 158/60 (!) 141/64 (!) 180/84 (!) 176/65  Pulse: 66 68 62 (!) 50  Resp: 20  (!) 21 18  Temp: 98.7 F (37.1 C) 98.6 F (37 C) 98.8 F (37.1 C) 98.7 F (37.1 C)  TempSrc: Oral Oral Oral Oral  SpO2: 96%  97% 97%  Weight:      Height:        Intake/Output Summary (Last 24 hours) at 01/05/2018 1203 Last data filed at 01/05/2018 0130 Gross per 24 hour  Intake 1133.33 ml  Output -  Net 1133.33 ml   Filed Weights   01/02/18 0010 01/02/18 0500 01/03/18 0500  Weight: 66.3 kg 65.3 kg 66.1 kg    Telemetry    Normal sinus rhythm- Personally Reviewed  ECG    ECG of 01/04/2018 demonstrates normal sinus rhythm, first-degree AV block, and poor R wave progression.,- Personally Reviewed  Physical Exam  Elderly and frail GEN: No acute distress.     Neck: No JVD Cardiac: RRR, no murmurs, rubs..  A loud S4 gallop is heard. Respiratory: Clear to auscultation bilaterally. GI: Soft, nontender, non-distended  MS: No edema; No deformity. Neuro:  Nonfocal  Psych: Normal affect   Labs    Chemistry Recent Labs  Lab 01/01/18 1725  01/03/18 0205 01/04/18 1535 01/05/18 0412  NA 136   < > 140 139 139  K 5.6*   < > 4.7 5.6* 5.5*  CL 113*   < > 120* 117* 118*  CO2 15*   < > 14* 14* 14*  GLUCOSE 302*   < > 140* 172* 318*  BUN 89*   < > 72* 67* 63*  CREATININE 2.60*   < > 1.98* 2.11* 2.02*  CALCIUM 9.0   < > 8.5* 8.3* 8.2*  PROT 7.0  --   --   --   --   ALBUMIN 2.4*  --   --   --   --   AST 14*  --   --   --   --   ALT 17  --   --   --   --   ALKPHOS 114  --   --   --   --   BILITOT 0.4  --   --   --   --  GFRNONAA 17*   < > 24* 22* 23*  GFRAA 20*   < > 27* 25* 27*  ANIONGAP 8   < > 6 8 7    < > = values in this interval not displayed.     Hematology Recent Labs  Lab 01/04/18 0615 01/04/18 1535 01/05/18 0412  WBC 8.5 7.8 8.1  RBC 2.77* 3.28* 3.75*  HGB 6.9* 8.4* 9.8*  HCT 23.8* 27.9* 31.6*  MCV 85.9 85.1 84.3  MCH 24.9* 25.6* 26.1  MCHC 29.0* 30.1 31.0  RDW 19.0* 17.7* 17.2*  PLT 229 223 230    Cardiac Enzymes Recent Labs  Lab 01/03/18 1305 01/03/18 1845 01/03/18 2358 01/04/18 0615  TROPONINI 4.43* 4.36* 3.78* 3.10*   No results for input(s): TROPIPOC in the last 168 hours.   BNPNo results for input(s): BNP, PROBNP in the last 168 hours.   DDimer No results for input(s): DDIMER in the last 168 hours.   Radiology    No results found.  Cardiac Studies   2D Doppler echocardiogram 10/07/2017: Study Conclusions  - Left ventricle: The cavity size was normal. There was mild   concentric hypertrophy. Systolic function was mildly reduced. The   estimated ejection fraction was in the range of 45% to 50%.   Hypokinesis of the basal-midinferoseptal myocardium. Doppler   parameters are consistent with  abnormal left ventricular   relaxation (grade 1 diastolic dysfunction). Doppler parameters   are consistent with high ventricular filling pressure. - Aortic valve: There was moderate stenosis. There was moderate   regurgitation. Peak velocity (S): 365 cm/s. Mean gradient (S): 26   mm Hg. Valve area (VTI): 0.85 cm^2. Valve area (Vmax): 0.84 cm^2.   Valve area (Vmean): 0.81 cm^2. Regurgitation pressure half-time:   379 ms. - Mitral valve: Transvalvular velocity was within the normal range.   There was no evidence for stenosis. There was mild regurgitation. - Left atrium: The atrium was mildly dilated. - Right ventricle: The cavity size was normal. Wall thickness was   normal. Systolic function was normal. - Right atrium: The atrium was severely dilated. - Tricuspid valve: There was mild regurgitation. - Pulmonary arteries: Systolic pressure was severely increased. PA   peak pressure: 60 mm Hg (S).  Patient Profile     77 y.o. female with a hx of CAD withmultiple PCIs, last 07/2017 w/ DES LAD, LCX, modAS/AI,HFrEF (EF 40-45%), CKD III, DM, HTN, HLD, pulm HTN, R foot Charcot's deformity, PAD, anemiawho is being seen today for the evaluation ofelevated troponin I 7.02 slowly trending down to 3/40 8 hours.  Assessment & Plan    1.  CAD with multiple prior coronary intervention procedures, most recently in June 2019.  She is still on dual antiplatelet therapy. 2.  Non-ST elevation myocardial infarction based upon enzymes, this admission.  Demand ischemia versus true ACS. 3.  Anemia requiring transfusion this admission 4.  CKD stage III 5.   Myositis right foot Charcot joint/foot  Overall seems to be stable from cardiac standpoint.  She will be at risk for general anesthesia associated with amputation.  The surgical service at [facility] should be made aware of this admission and the elevated troponin levels.  In absence of ischemic symptoms and with coronary  angioplasty for him to within the past 3 months, conservative medical management is recommended.  Will likely need to hold clopidogrel for 5 days prior to surgery if the decision is made to proceed.      For questions or updates,  please contact [facility] Please consult [email or web address] for contact info under Cardiology/STEMI.      Signed, Lesleigh Noe, MD  01/05/2018, 12:03 PM

## 2018-01-06 DIAGNOSIS — Z7982 Long term (current) use of aspirin: Secondary | ICD-10-CM

## 2018-01-06 DIAGNOSIS — Z7902 Long term (current) use of antithrombotics/antiplatelets: Secondary | ICD-10-CM

## 2018-01-06 LAB — BASIC METABOLIC PANEL
Anion gap: 7 (ref 5–15)
BUN: 51 mg/dL — ABNORMAL HIGH (ref 8–23)
CO2: 14 mmol/L — ABNORMAL LOW (ref 22–32)
Calcium: 8.5 mg/dL — ABNORMAL LOW (ref 8.9–10.3)
Chloride: 121 mmol/L — ABNORMAL HIGH (ref 98–111)
Creatinine, Ser: 1.75 mg/dL — ABNORMAL HIGH (ref 0.44–1.00)
GFR calc Af Amer: 32 mL/min — ABNORMAL LOW (ref >60)
GFR calc non Af Amer: 27 mL/min — ABNORMAL LOW (ref >60)
Glucose, Bld: 277 mg/dL — ABNORMAL HIGH (ref 70–99)
Potassium: 5.1 mmol/L (ref 3.5–5.1)
Sodium: 142 mmol/L (ref 135–145)

## 2018-01-06 LAB — CBC
HCT: 33.3 % — ABNORMAL LOW (ref 36.0–46.0)
Hemoglobin: 10.2 g/dL — ABNORMAL LOW (ref 12.0–15.0)
MCH: 26 pg (ref 26.0–34.0)
MCHC: 30.6 g/dL (ref 30.0–36.0)
MCV: 84.9 fL (ref 78.0–100.0)
Platelets: 229 10*3/uL (ref 150–400)
RBC: 3.92 MIL/uL (ref 3.87–5.11)
RDW: 17.5 % — ABNORMAL HIGH (ref 11.5–15.5)
WBC: 8 10*3/uL (ref 4.0–10.5)

## 2018-01-06 LAB — GLUCOSE, CAPILLARY
Glucose-Capillary: 245 mg/dL — ABNORMAL HIGH (ref 70–99)
Glucose-Capillary: 193 mg/dL — ABNORMAL HIGH (ref 70–99)

## 2018-01-06 LAB — CULTURE, BLOOD (ROUTINE X 2)
Culture: NO GROWTH
Special Requests: ADEQUATE

## 2018-01-06 MED ORDER — ROSUVASTATIN CALCIUM 20 MG PO TABS: 20.0000 mg | ORAL_TABLET | Freq: Every day | ORAL | 3 refills | Status: AC

## 2018-01-06 NOTE — Discharge Instructions (Signed)
[name],   It has been a pleasure working with you and we are glad you're feeling better. You were hospitalized for hip pain, and you developed chest pain.   For your Hip pain,  Continue taking your home pain medications.  -We think it may be related to inflammation of your muscles, we switched your atorvastatin to rosuvastatin to decrease the risk of more inflammation  For the chest pain, you had a type 2 NSTEMI with elevated troponin's that was likely due to demand ischemia from your febrile illness. You were medically managed and your symptoms resolved. -Please continue to take your aspirin and clopidogrel for now -We will call your surgeons and let them know what happened while you were hospitalized -Follow up with your cardiologist  Follow up with your primary care provider in 1-2 weeks  If your symptoms worsen or you develop new symptoms, please seek medical help whether it is your primary care provider or emergency department.  If you have any questions about this hospitalization please call [phone].

## 2018-01-06 NOTE — Progress Notes (Signed)
[facility] will sign off.   Medication Recommendations/Other recommendations (labs, testing, etc):  No change from yesterday's recommendation of Dr. Katrinka Blazing  Follow up as an outpatient:  Follow up with Dr. Antoine Poche post discharge

## 2018-01-06 NOTE — Care Management Note (Signed)
Case Management Note  Patient Details  Name: KIMBLE DELAURENTIS MRN: [id number] Date of Birth: 20-Feb-1941  Subjective/Objective:                    Action/Plan: Pt discharging home with self care. Recommendation are for SNF but patient is refusing. CM met with her about [facility] and she wants to see what [facility] says tomorrow about surgery before deciding on [facility]. CM made her aware to let [facility] know that [facility] was recommended and they will need to order if she needs prior to surgery. She voiced understanding. Pt states she has all needed DME at home.  Spouse to provide transport home.   Expected Discharge Date:  01/06/18               Expected Discharge Plan:  [place]  In-House Referral:  NA  Discharge planning Services  CM Consult  Post Acute Care Choice:    Choice offered to:  Patient  DME Arranged:  N/A DME Agency:  NA  HH Arranged:    [place] Agency:     Status of Service:  Completed, signed off  If discussed at [employer] of Stay Meetings, dates discussed:    Additional Comments:  Pollie Friar, RN 01/06/2018, 12:46 PM

## 2018-01-06 NOTE — Progress Notes (Signed)
Subjective: Patient was doing okay today, she reported right medial thigh pain overnight and was requesting pain medications. She states that she is still having right medical thigh pain that is constant, she reported the the medications help but that it comes back after awhile. She reported that she is still having left hip pain but that this has improved since she has been here. She denies any episodes of chest pain since yesterday morning. Denies any nausea, vomiting, fevers or chills overnight. She was requesting to go home so that she can make her pre-op surgery appointment tomorrow. We discussed that given her hospital course she will have to see how this effects her surgery. We talked about PT recommending SNF and she reported that she did not want to go there since the last time she went to rehab she did not feel like it helped her. We discussed the plan for the day and she is in agreement.   Objective:  Vital signs in last 24 hours: Vitals:   01/05/18 1215 01/05/18 1948 01/06/18 0026 01/06/18 0356  BP: (!) 155/62 (!) 186/86 (!) 179/63 (!) 181/69  Pulse: (!) 56 66 (!) 55 (!) 58  Resp: 20 18 18 18   Temp: (!) 97.4 F (36.3 C) 97.9 F (36.6 C) 98.4 F (36.9 C) 98.3 F (36.8 C)  TempSrc: Oral Oral Oral Oral  SpO2: 97% 97% 98% 99%  Weight: 66.1 kg     Height: 5\' 6"  (1.676 m)       General: Chronically ill appearing, NAD Cardiac: RRR, normal S1, S2, no murmurs, rubs or gallops  Pulmonary: Lungs CTA bilaterally, no wheezing, rhonchi or rales  Extremity: Both lower extremities in bandages, no tenderness, equally warm, right charcots foot Psychiatry: Normal mood and affect     Assessment/Plan: This is a 77 year old female with a PMH of CAD (s/p stenting), HLD, HTN, combined systolic and diastolic heart failure, DM,who presented with a a 2 week history of left sided groin pain. Active Problems:   DM type 2 causing CKD stage 3 (HCC)   Charcot foot due to diabetes mellitus (HCC)  Chronic combined systolic and diastolic heart failure (HCC)   Stasis dermatitis of both legs  Febrile illness: -Likely 2/2 a cellulitis of the right lower extremity, all tests of the left hip and come back negative, is on keflex for a possibly cellulitis, she has remained afebrile overnight and her WBC is 8 today. -She appears to be improving, her right lower leg is non-tender, has less erythema and edema compared to when she came in.  -She received vancomycin and ceftriaxone for 3 days, switched to keflex yesterday, today is day 5 of antibiotics -Should be able to discontinue antibiotics since treatment for cellulitis is normally 5 days  Left hip pain: -MRI showed bilateral trochanteric bursitis, diffuse soft tissue edema around pelvis and both hips likely representing myositis vs muscle strains -Atorvastatin was switched to rosuvastatin for a lower risk of myositis, CK was negative which should make myositis less likely -She continues to have right medial thigh pain, reports that it improves with the pain medications, this could aloso be related to a myositis Dilaudid was switched to Oxy IR yesterday, will likely be able to go home wit this -PT recommended SNF, patient does not seem interested in this and would like to go home  NSTEMI:  -Patient has had no episodes of chest pain since yesterday morning, likely 2/2 demand ischemia.  -Cardiology: Continue ASA and clopidogrel, hold for  5 days prior to surgery if plan is to proceed -Continue atrovastatin and carverdilol  FEN: No fluids, replete lytes prn, heart healthy diet  VTE ppx: ASA, clopidogrel Code Status: DNR    Dispo: Anticipated discharge in approximately 0-1 day(s).   Claudean Severance, MD 01/06/2018, 6:34 AM Pager: [phone]

## 2018-01-07 DIAGNOSIS — L97919 Non-pressure chronic ulcer of unspecified part of right lower leg with unspecified severity: Secondary | ICD-10-CM | POA: Diagnosis not present

## 2018-01-07 DIAGNOSIS — I200 Unstable angina: Secondary | ICD-10-CM | POA: Diagnosis not present

## 2018-01-07 DIAGNOSIS — Z9861 Coronary angioplasty status: Secondary | ICD-10-CM | POA: Diagnosis not present

## 2018-01-07 DIAGNOSIS — I350 Nonrheumatic aortic (valve) stenosis: Secondary | ICD-10-CM | POA: Diagnosis not present

## 2018-01-07 DIAGNOSIS — I2510 Atherosclerotic heart disease of native coronary artery without angina pectoris: Secondary | ICD-10-CM | POA: Diagnosis not present

## 2018-01-07 DIAGNOSIS — E11610 Type 2 diabetes mellitus with diabetic neuropathic arthropathy: Secondary | ICD-10-CM | POA: Diagnosis not present

## 2018-01-07 DIAGNOSIS — I87331 Chronic venous hypertension (idiopathic) with ulcer and inflammation of right lower extremity: Secondary | ICD-10-CM | POA: Diagnosis not present

## 2018-01-07 DIAGNOSIS — I214 Non-ST elevation (NSTEMI) myocardial infarction: Secondary | ICD-10-CM | POA: Diagnosis not present

## 2018-01-07 DIAGNOSIS — I5041 Acute combined systolic (congestive) and diastolic (congestive) heart failure: Secondary | ICD-10-CM | POA: Diagnosis not present

## 2018-01-07 LAB — TYPE AND SCREEN
ABO/RH(D): O POS
Antibody Screen: NEGATIVE
Unit division: 0
Unit division: 0
Unit division: 0

## 2018-01-07 LAB — BPAM RBC
Blood Product Expiration Date: 201909232359
Blood Product Expiration Date: 201909232359
Blood Product Expiration Date: 201909232359
ISSUE DATE / TIME: 201908251015
ISSUE DATE / TIME: 201908252217
Unit Type and Rh: 5100
Unit Type and Rh: 5100
Unit Type and Rh: 5100

## 2018-01-07 LAB — CULTURE, BLOOD (ROUTINE X 2)
Culture: NO GROWTH
Special Requests: ADEQUATE

## 2018-01-14 DIAGNOSIS — G9341 Metabolic encephalopathy: Secondary | ICD-10-CM | POA: Diagnosis not present

## 2018-01-14 DIAGNOSIS — N179 Acute kidney failure, unspecified: Secondary | ICD-10-CM | POA: Diagnosis not present

## 2018-01-14 DIAGNOSIS — R5381 Other malaise: Secondary | ICD-10-CM | POA: Diagnosis not present

## 2018-01-14 DIAGNOSIS — L97519 Non-pressure chronic ulcer of other part of right foot with unspecified severity: Secondary | ICD-10-CM | POA: Diagnosis not present

## 2018-01-14 DIAGNOSIS — N183 Chronic kidney disease, stage 3 (moderate): Secondary | ICD-10-CM | POA: Diagnosis not present

## 2018-01-14 DIAGNOSIS — I130 Hypertensive heart and chronic kidney disease with heart failure and stage 1 through stage 4 chronic kidney disease, or unspecified chronic kidney disease: Secondary | ICD-10-CM | POA: Diagnosis not present

## 2018-01-14 DIAGNOSIS — R5082 Postprocedural fever: Secondary | ICD-10-CM | POA: Diagnosis not present

## 2018-01-14 DIAGNOSIS — R262 Difficulty in walking, not elsewhere classified: Secondary | ICD-10-CM | POA: Diagnosis not present

## 2018-01-14 DIAGNOSIS — Z7189 Other specified counseling: Secondary | ICD-10-CM | POA: Diagnosis not present

## 2018-01-14 DIAGNOSIS — B353 Tinea pedis: Secondary | ICD-10-CM | POA: Diagnosis not present

## 2018-01-14 DIAGNOSIS — M79604 Pain in right leg: Secondary | ICD-10-CM | POA: Diagnosis not present

## 2018-01-14 DIAGNOSIS — Z452 Encounter for adjustment and management of vascular access device: Secondary | ICD-10-CM | POA: Diagnosis not present

## 2018-01-14 DIAGNOSIS — I8310 Varicose veins of unspecified lower extremity with inflammation: Secondary | ICD-10-CM | POA: Diagnosis not present

## 2018-01-14 DIAGNOSIS — I739 Peripheral vascular disease, unspecified: Secondary | ICD-10-CM | POA: Diagnosis not present

## 2018-01-14 DIAGNOSIS — D631 Anemia in chronic kidney disease: Secondary | ICD-10-CM | POA: Diagnosis not present

## 2018-01-14 DIAGNOSIS — Z9181 History of falling: Secondary | ICD-10-CM | POA: Diagnosis not present

## 2018-01-14 DIAGNOSIS — I70261 Atherosclerosis of native arteries of extremities with gangrene, right leg: Secondary | ICD-10-CM | POA: Diagnosis not present

## 2018-01-14 DIAGNOSIS — I25110 Atherosclerotic heart disease of native coronary artery with unstable angina pectoris: Secondary | ICD-10-CM | POA: Diagnosis not present

## 2018-01-14 DIAGNOSIS — E782 Mixed hyperlipidemia: Secondary | ICD-10-CM | POA: Diagnosis not present

## 2018-01-14 DIAGNOSIS — I5041 Acute combined systolic (congestive) and diastolic (congestive) heart failure: Secondary | ICD-10-CM | POA: Diagnosis not present

## 2018-01-14 DIAGNOSIS — I252 Old myocardial infarction: Secondary | ICD-10-CM | POA: Diagnosis not present

## 2018-01-14 DIAGNOSIS — Z7401 Bed confinement status: Secondary | ICD-10-CM | POA: Diagnosis not present

## 2018-01-14 DIAGNOSIS — S91309A Unspecified open wound, unspecified foot, initial encounter: Secondary | ICD-10-CM | POA: Diagnosis not present

## 2018-01-14 DIAGNOSIS — J811 Chronic pulmonary edema: Secondary | ICD-10-CM | POA: Diagnosis not present

## 2018-01-14 DIAGNOSIS — E875 Hyperkalemia: Secondary | ICD-10-CM | POA: Diagnosis not present

## 2018-01-14 DIAGNOSIS — N189 Chronic kidney disease, unspecified: Secondary | ICD-10-CM | POA: Diagnosis not present

## 2018-01-14 DIAGNOSIS — I517 Cardiomegaly: Secondary | ICD-10-CM | POA: Diagnosis not present

## 2018-01-14 DIAGNOSIS — J9811 Atelectasis: Secondary | ICD-10-CM | POA: Diagnosis not present

## 2018-01-14 DIAGNOSIS — R001 Bradycardia, unspecified: Secondary | ICD-10-CM | POA: Diagnosis not present

## 2018-01-14 DIAGNOSIS — R509 Fever, unspecified: Secondary | ICD-10-CM | POA: Diagnosis not present

## 2018-01-14 DIAGNOSIS — M25552 Pain in left hip: Secondary | ICD-10-CM | POA: Diagnosis not present

## 2018-01-14 DIAGNOSIS — E1169 Type 2 diabetes mellitus with other specified complication: Secondary | ICD-10-CM | POA: Diagnosis not present

## 2018-01-14 DIAGNOSIS — I200 Unstable angina: Secondary | ICD-10-CM | POA: Diagnosis not present

## 2018-01-14 DIAGNOSIS — R079 Chest pain, unspecified: Secondary | ICD-10-CM | POA: Diagnosis not present

## 2018-01-14 DIAGNOSIS — I872 Venous insufficiency (chronic) (peripheral): Secondary | ICD-10-CM | POA: Diagnosis not present

## 2018-01-14 DIAGNOSIS — E11319 Type 2 diabetes mellitus with unspecified diabetic retinopathy without macular edema: Secondary | ICD-10-CM | POA: Diagnosis not present

## 2018-01-14 DIAGNOSIS — M60004 Infective myositis, unspecified left leg: Secondary | ICD-10-CM | POA: Diagnosis not present

## 2018-01-14 DIAGNOSIS — M60052 Infective myositis, left thigh: Secondary | ICD-10-CM | POA: Diagnosis not present

## 2018-01-14 DIAGNOSIS — M6281 Muscle weakness (generalized): Secondary | ICD-10-CM | POA: Diagnosis not present

## 2018-01-14 DIAGNOSIS — Z89611 Acquired absence of right leg above knee: Secondary | ICD-10-CM | POA: Diagnosis not present

## 2018-01-14 DIAGNOSIS — Z8719 Personal history of other diseases of the digestive system: Secondary | ICD-10-CM | POA: Diagnosis not present

## 2018-01-14 DIAGNOSIS — R109 Unspecified abdominal pain: Secondary | ICD-10-CM | POA: Diagnosis not present

## 2018-01-14 DIAGNOSIS — G8929 Other chronic pain: Secondary | ICD-10-CM | POA: Diagnosis not present

## 2018-01-14 DIAGNOSIS — M21961 Unspecified acquired deformity of right lower leg: Secondary | ICD-10-CM | POA: Diagnosis not present

## 2018-01-14 DIAGNOSIS — I87319 Chronic venous hypertension (idiopathic) with ulcer of unspecified lower extremity: Secondary | ICD-10-CM | POA: Diagnosis not present

## 2018-01-14 DIAGNOSIS — M86061 Acute hematogenous osteomyelitis, right tibia and fibula: Secondary | ICD-10-CM | POA: Diagnosis not present

## 2018-01-14 DIAGNOSIS — G894 Chronic pain syndrome: Secondary | ICD-10-CM | POA: Diagnosis not present

## 2018-01-14 DIAGNOSIS — D62 Acute posthemorrhagic anemia: Secondary | ICD-10-CM | POA: Diagnosis not present

## 2018-01-14 DIAGNOSIS — I5043 Acute on chronic combined systolic (congestive) and diastolic (congestive) heart failure: Secondary | ICD-10-CM | POA: Diagnosis not present

## 2018-01-14 DIAGNOSIS — M60009 Infective myositis, unspecified site: Secondary | ICD-10-CM | POA: Diagnosis not present

## 2018-01-14 DIAGNOSIS — S91301A Unspecified open wound, right foot, initial encounter: Secondary | ICD-10-CM | POA: Diagnosis not present

## 2018-01-14 DIAGNOSIS — E118 Type 2 diabetes mellitus with unspecified complications: Secondary | ICD-10-CM | POA: Diagnosis not present

## 2018-01-14 DIAGNOSIS — A4189 Other specified sepsis: Secondary | ICD-10-CM | POA: Diagnosis not present

## 2018-01-14 DIAGNOSIS — I2510 Atherosclerotic heart disease of native coronary artery without angina pectoris: Secondary | ICD-10-CM | POA: Diagnosis not present

## 2018-01-14 DIAGNOSIS — I5030 Unspecified diastolic (congestive) heart failure: Secondary | ICD-10-CM | POA: Diagnosis not present

## 2018-01-14 DIAGNOSIS — Z89612 Acquired absence of left leg above knee: Secondary | ICD-10-CM | POA: Diagnosis not present

## 2018-01-14 DIAGNOSIS — I110 Hypertensive heart disease with heart failure: Secondary | ICD-10-CM | POA: Diagnosis not present

## 2018-01-14 DIAGNOSIS — T8743 Infection of amputation stump, right lower extremity: Secondary | ICD-10-CM | POA: Diagnosis not present

## 2018-01-14 DIAGNOSIS — E1122 Type 2 diabetes mellitus with diabetic chronic kidney disease: Secondary | ICD-10-CM | POA: Diagnosis not present

## 2018-01-14 DIAGNOSIS — Z515 Encounter for palliative care: Secondary | ICD-10-CM | POA: Diagnosis not present

## 2018-01-14 DIAGNOSIS — Z955 Presence of coronary angioplasty implant and graft: Secondary | ICD-10-CM | POA: Diagnosis not present

## 2018-01-14 DIAGNOSIS — E872 Acidosis: Secondary | ICD-10-CM | POA: Diagnosis not present

## 2018-01-14 DIAGNOSIS — E11610 Type 2 diabetes mellitus with diabetic neuropathic arthropathy: Secondary | ICD-10-CM | POA: Diagnosis not present

## 2018-01-14 DIAGNOSIS — Z1619 Resistance to other specified beta lactam antibiotics: Secondary | ICD-10-CM | POA: Diagnosis not present

## 2018-01-14 DIAGNOSIS — Z4781 Encounter for orthopedic aftercare following surgical amputation: Secondary | ICD-10-CM | POA: Diagnosis not present

## 2018-01-14 DIAGNOSIS — E1151 Type 2 diabetes mellitus with diabetic peripheral angiopathy without gangrene: Secondary | ICD-10-CM | POA: Diagnosis not present

## 2018-01-14 DIAGNOSIS — T8149XA Infection following a procedure, other surgical site, initial encounter: Secondary | ICD-10-CM | POA: Diagnosis not present

## 2018-01-14 DIAGNOSIS — L859 Epidermal thickening, unspecified: Secondary | ICD-10-CM | POA: Diagnosis not present

## 2018-01-14 DIAGNOSIS — N390 Urinary tract infection, site not specified: Secondary | ICD-10-CM | POA: Diagnosis not present

## 2018-01-14 DIAGNOSIS — M609 Myositis, unspecified: Secondary | ICD-10-CM | POA: Diagnosis not present

## 2018-01-14 DIAGNOSIS — Y33XXXA Other specified events, undetermined intent, initial encounter: Secondary | ICD-10-CM | POA: Diagnosis not present

## 2018-01-14 DIAGNOSIS — Y838 Other surgical procedures as the cause of abnormal reaction of the patient, or of later complication, without mention of misadventure at the time of the procedure: Secondary | ICD-10-CM | POA: Diagnosis not present

## 2018-01-14 DIAGNOSIS — B9689 Other specified bacterial agents as the cause of diseases classified elsewhere: Secondary | ICD-10-CM | POA: Diagnosis not present

## 2018-01-14 DIAGNOSIS — G8918 Other acute postprocedural pain: Secondary | ICD-10-CM | POA: Diagnosis not present

## 2018-01-14 DIAGNOSIS — R7881 Bacteremia: Secondary | ICD-10-CM | POA: Diagnosis not present

## 2018-01-14 DIAGNOSIS — R600 Localized edema: Secondary | ICD-10-CM | POA: Diagnosis not present

## 2018-01-14 DIAGNOSIS — E162 Hypoglycemia, unspecified: Secondary | ICD-10-CM | POA: Diagnosis not present

## 2018-01-14 DIAGNOSIS — E1121 Type 2 diabetes mellitus with diabetic nephropathy: Secondary | ICD-10-CM | POA: Diagnosis not present

## 2018-01-14 DIAGNOSIS — M86171 Other acute osteomyelitis, right ankle and foot: Secondary | ICD-10-CM | POA: Diagnosis not present

## 2018-01-14 DIAGNOSIS — I129 Hypertensive chronic kidney disease with stage 1 through stage 4 chronic kidney disease, or unspecified chronic kidney disease: Secondary | ICD-10-CM | POA: Diagnosis not present

## 2018-01-14 DIAGNOSIS — R338 Other retention of urine: Secondary | ICD-10-CM | POA: Diagnosis not present

## 2018-01-14 DIAGNOSIS — Z7409 Other reduced mobility: Secondary | ICD-10-CM | POA: Diagnosis not present

## 2018-01-14 DIAGNOSIS — M79605 Pain in left leg: Secondary | ICD-10-CM | POA: Diagnosis not present

## 2018-01-14 DIAGNOSIS — E11621 Type 2 diabetes mellitus with foot ulcer: Secondary | ICD-10-CM | POA: Diagnosis not present

## 2018-01-14 DIAGNOSIS — E1142 Type 2 diabetes mellitus with diabetic polyneuropathy: Secondary | ICD-10-CM | POA: Diagnosis not present

## 2018-01-14 DIAGNOSIS — J90 Pleural effusion, not elsewhere classified: Secondary | ICD-10-CM | POA: Diagnosis not present

## 2018-01-14 DIAGNOSIS — R918 Other nonspecific abnormal finding of lung field: Secondary | ICD-10-CM | POA: Diagnosis not present

## 2018-01-14 DIAGNOSIS — R293 Abnormal posture: Secondary | ICD-10-CM | POA: Diagnosis not present

## 2018-01-14 DIAGNOSIS — R2689 Other abnormalities of gait and mobility: Secondary | ICD-10-CM | POA: Diagnosis not present

## 2018-01-14 DIAGNOSIS — M549 Dorsalgia, unspecified: Secondary | ICD-10-CM | POA: Diagnosis not present

## 2018-02-18 DIAGNOSIS — D631 Anemia in chronic kidney disease: Secondary | ICD-10-CM | POA: Diagnosis not present

## 2018-02-18 DIAGNOSIS — E118 Type 2 diabetes mellitus with unspecified complications: Secondary | ICD-10-CM | POA: Diagnosis not present

## 2018-02-18 DIAGNOSIS — Z743 Need for continuous supervision: Secondary | ICD-10-CM | POA: Diagnosis not present

## 2018-02-18 DIAGNOSIS — I10 Essential (primary) hypertension: Secondary | ICD-10-CM | POA: Diagnosis not present

## 2018-02-18 DIAGNOSIS — E162 Hypoglycemia, unspecified: Secondary | ICD-10-CM | POA: Diagnosis not present

## 2018-02-18 DIAGNOSIS — I129 Hypertensive chronic kidney disease with stage 1 through stage 4 chronic kidney disease, or unspecified chronic kidney disease: Secondary | ICD-10-CM | POA: Diagnosis not present

## 2018-02-18 DIAGNOSIS — Z1619 Resistance to other specified beta lactam antibiotics: Secondary | ICD-10-CM | POA: Diagnosis not present

## 2018-02-18 DIAGNOSIS — Z4781 Encounter for orthopedic aftercare following surgical amputation: Secondary | ICD-10-CM | POA: Diagnosis not present

## 2018-02-18 DIAGNOSIS — I5021 Acute systolic (congestive) heart failure: Secondary | ICD-10-CM | POA: Diagnosis not present

## 2018-02-18 DIAGNOSIS — E875 Hyperkalemia: Secondary | ICD-10-CM | POA: Diagnosis not present

## 2018-02-18 DIAGNOSIS — L859 Epidermal thickening, unspecified: Secondary | ICD-10-CM | POA: Diagnosis not present

## 2018-02-18 DIAGNOSIS — I2510 Atherosclerotic heart disease of native coronary artery without angina pectoris: Secondary | ICD-10-CM | POA: Diagnosis not present

## 2018-02-18 DIAGNOSIS — N289 Disorder of kidney and ureter, unspecified: Secondary | ICD-10-CM | POA: Diagnosis not present

## 2018-02-18 DIAGNOSIS — I350 Nonrheumatic aortic (valve) stenosis: Secondary | ICD-10-CM | POA: Diagnosis not present

## 2018-02-18 DIAGNOSIS — I87319 Chronic venous hypertension (idiopathic) with ulcer of unspecified lower extremity: Secondary | ICD-10-CM | POA: Diagnosis not present

## 2018-02-18 DIAGNOSIS — G894 Chronic pain syndrome: Secondary | ICD-10-CM | POA: Diagnosis not present

## 2018-02-18 DIAGNOSIS — M6281 Muscle weakness (generalized): Secondary | ICD-10-CM | POA: Diagnosis not present

## 2018-02-18 DIAGNOSIS — E782 Mixed hyperlipidemia: Secondary | ICD-10-CM | POA: Diagnosis not present

## 2018-02-18 DIAGNOSIS — I5041 Acute combined systolic (congestive) and diastolic (congestive) heart failure: Secondary | ICD-10-CM | POA: Diagnosis not present

## 2018-02-18 DIAGNOSIS — R404 Transient alteration of awareness: Secondary | ICD-10-CM | POA: Diagnosis not present

## 2018-02-18 DIAGNOSIS — Z89611 Acquired absence of right leg above knee: Secondary | ICD-10-CM | POA: Diagnosis not present

## 2018-02-18 DIAGNOSIS — N189 Chronic kidney disease, unspecified: Secondary | ICD-10-CM | POA: Diagnosis not present

## 2018-02-18 DIAGNOSIS — I110 Hypertensive heart disease with heart failure: Secondary | ICD-10-CM | POA: Diagnosis not present

## 2018-02-18 DIAGNOSIS — R338 Other retention of urine: Secondary | ICD-10-CM | POA: Diagnosis not present

## 2018-02-18 DIAGNOSIS — Z8719 Personal history of other diseases of the digestive system: Secondary | ICD-10-CM | POA: Diagnosis not present

## 2018-02-18 DIAGNOSIS — R410 Disorientation, unspecified: Secondary | ICD-10-CM | POA: Diagnosis not present

## 2018-02-18 DIAGNOSIS — I739 Peripheral vascular disease, unspecified: Secondary | ICD-10-CM | POA: Diagnosis not present

## 2018-02-18 DIAGNOSIS — R262 Difficulty in walking, not elsewhere classified: Secondary | ICD-10-CM | POA: Diagnosis not present

## 2018-02-18 DIAGNOSIS — M549 Dorsalgia, unspecified: Secondary | ICD-10-CM | POA: Diagnosis not present

## 2018-02-18 DIAGNOSIS — E119 Type 2 diabetes mellitus without complications: Secondary | ICD-10-CM | POA: Diagnosis not present

## 2018-02-18 DIAGNOSIS — M79604 Pain in right leg: Secondary | ICD-10-CM | POA: Diagnosis not present

## 2018-02-18 DIAGNOSIS — R293 Abnormal posture: Secondary | ICD-10-CM | POA: Diagnosis not present

## 2018-02-18 DIAGNOSIS — I200 Unstable angina: Secondary | ICD-10-CM | POA: Diagnosis not present

## 2018-02-18 DIAGNOSIS — E11610 Type 2 diabetes mellitus with diabetic neuropathic arthropathy: Secondary | ICD-10-CM | POA: Diagnosis not present

## 2018-02-18 DIAGNOSIS — B353 Tinea pedis: Secondary | ICD-10-CM | POA: Diagnosis not present

## 2018-02-18 DIAGNOSIS — I8310 Varicose veins of unspecified lower extremity with inflammation: Secondary | ICD-10-CM | POA: Diagnosis not present

## 2018-02-18 DIAGNOSIS — R2689 Other abnormalities of gait and mobility: Secondary | ICD-10-CM | POA: Diagnosis not present

## 2018-02-18 DIAGNOSIS — E872 Acidosis: Secondary | ICD-10-CM | POA: Diagnosis not present

## 2018-02-18 DIAGNOSIS — E161 Other hypoglycemia: Secondary | ICD-10-CM | POA: Diagnosis not present

## 2018-02-18 DIAGNOSIS — N183 Chronic kidney disease, stage 3 (moderate): Secondary | ICD-10-CM | POA: Diagnosis not present

## 2018-02-18 DIAGNOSIS — I872 Venous insufficiency (chronic) (peripheral): Secondary | ICD-10-CM | POA: Diagnosis not present

## 2018-02-18 DIAGNOSIS — Z7401 Bed confinement status: Secondary | ICD-10-CM | POA: Diagnosis not present

## 2018-02-18 DIAGNOSIS — R079 Chest pain, unspecified: Secondary | ICD-10-CM | POA: Diagnosis not present

## 2018-02-18 DIAGNOSIS — R0602 Shortness of breath: Secondary | ICD-10-CM | POA: Diagnosis not present

## 2018-03-02 DIAGNOSIS — R262 Difficulty in walking, not elsewhere classified: Secondary | ICD-10-CM | POA: Diagnosis not present

## 2018-03-06 DIAGNOSIS — E162 Hypoglycemia, unspecified: Secondary | ICD-10-CM

## 2018-03-06 DIAGNOSIS — E872 Acidosis: Secondary | ICD-10-CM

## 2018-03-06 DIAGNOSIS — I350 Nonrheumatic aortic (valve) stenosis: Secondary | ICD-10-CM

## 2018-03-06 DIAGNOSIS — N289 Disorder of kidney and ureter, unspecified: Secondary | ICD-10-CM

## 2018-03-06 DIAGNOSIS — N189 Chronic kidney disease, unspecified: Secondary | ICD-10-CM | POA: Diagnosis not present

## 2018-03-06 DIAGNOSIS — I10 Essential (primary) hypertension: Secondary | ICD-10-CM

## 2018-03-06 DIAGNOSIS — I5021 Acute systolic (congestive) heart failure: Secondary | ICD-10-CM

## 2018-03-06 DIAGNOSIS — E875 Hyperkalemia: Secondary | ICD-10-CM | POA: Diagnosis not present

## 2018-03-06 DIAGNOSIS — E119 Type 2 diabetes mellitus without complications: Secondary | ICD-10-CM

## 2018-03-07 DIAGNOSIS — Z888 Allergy status to other drugs, medicaments and biological substances status: Secondary | ICD-10-CM | POA: Diagnosis not present

## 2018-03-07 DIAGNOSIS — L859 Epidermal thickening, unspecified: Secondary | ICD-10-CM | POA: Diagnosis not present

## 2018-03-07 DIAGNOSIS — R0602 Shortness of breath: Secondary | ICD-10-CM | POA: Diagnosis not present

## 2018-03-07 DIAGNOSIS — I5023 Acute on chronic systolic (congestive) heart failure: Secondary | ICD-10-CM | POA: Diagnosis not present

## 2018-03-07 DIAGNOSIS — D631 Anemia in chronic kidney disease: Secondary | ICD-10-CM | POA: Diagnosis not present

## 2018-03-07 DIAGNOSIS — I129 Hypertensive chronic kidney disease with stage 1 through stage 4 chronic kidney disease, or unspecified chronic kidney disease: Secondary | ICD-10-CM | POA: Diagnosis not present

## 2018-03-07 DIAGNOSIS — Z8719 Personal history of other diseases of the digestive system: Secondary | ICD-10-CM | POA: Diagnosis not present

## 2018-03-07 DIAGNOSIS — N179 Acute kidney failure, unspecified: Secondary | ICD-10-CM | POA: Diagnosis not present

## 2018-03-07 DIAGNOSIS — Z794 Long term (current) use of insulin: Secondary | ICD-10-CM | POA: Diagnosis not present

## 2018-03-07 DIAGNOSIS — E11610 Type 2 diabetes mellitus with diabetic neuropathic arthropathy: Secondary | ICD-10-CM | POA: Diagnosis not present

## 2018-03-07 DIAGNOSIS — I130 Hypertensive heart and chronic kidney disease with heart failure and stage 1 through stage 4 chronic kidney disease, or unspecified chronic kidney disease: Secondary | ICD-10-CM | POA: Diagnosis not present

## 2018-03-07 DIAGNOSIS — E872 Acidosis: Secondary | ICD-10-CM | POA: Diagnosis not present

## 2018-03-07 DIAGNOSIS — E43 Unspecified severe protein-calorie malnutrition: Secondary | ICD-10-CM | POA: Diagnosis not present

## 2018-03-07 DIAGNOSIS — R293 Abnormal posture: Secondary | ICD-10-CM | POA: Diagnosis not present

## 2018-03-07 DIAGNOSIS — Z79899 Other long term (current) drug therapy: Secondary | ICD-10-CM | POA: Diagnosis not present

## 2018-03-07 DIAGNOSIS — I5041 Acute combined systolic (congestive) and diastolic (congestive) heart failure: Secondary | ICD-10-CM | POA: Diagnosis not present

## 2018-03-07 DIAGNOSIS — Z1619 Resistance to other specified beta lactam antibiotics: Secondary | ICD-10-CM | POA: Diagnosis not present

## 2018-03-07 DIAGNOSIS — N189 Chronic kidney disease, unspecified: Secondary | ICD-10-CM | POA: Diagnosis not present

## 2018-03-07 DIAGNOSIS — I8310 Varicose veins of unspecified lower extremity with inflammation: Secondary | ICD-10-CM | POA: Diagnosis not present

## 2018-03-07 DIAGNOSIS — Z7982 Long term (current) use of aspirin: Secondary | ICD-10-CM | POA: Diagnosis not present

## 2018-03-07 DIAGNOSIS — N289 Disorder of kidney and ureter, unspecified: Secondary | ICD-10-CM | POA: Diagnosis not present

## 2018-03-07 DIAGNOSIS — J449 Chronic obstructive pulmonary disease, unspecified: Secondary | ICD-10-CM | POA: Diagnosis not present

## 2018-03-07 DIAGNOSIS — I5021 Acute systolic (congestive) heart failure: Secondary | ICD-10-CM | POA: Diagnosis not present

## 2018-03-07 DIAGNOSIS — I252 Old myocardial infarction: Secondary | ICD-10-CM | POA: Diagnosis not present

## 2018-03-07 DIAGNOSIS — R0902 Hypoxemia: Secondary | ICD-10-CM | POA: Diagnosis not present

## 2018-03-07 DIAGNOSIS — K5904 Chronic idiopathic constipation: Secondary | ICD-10-CM | POA: Diagnosis not present

## 2018-03-07 DIAGNOSIS — Z881 Allergy status to other antibiotic agents status: Secondary | ICD-10-CM | POA: Diagnosis not present

## 2018-03-07 DIAGNOSIS — Z4781 Encounter for orthopedic aftercare following surgical amputation: Secondary | ICD-10-CM | POA: Diagnosis not present

## 2018-03-07 DIAGNOSIS — E875 Hyperkalemia: Secondary | ICD-10-CM | POA: Diagnosis not present

## 2018-03-07 DIAGNOSIS — Z8673 Personal history of transient ischemic attack (TIA), and cerebral infarction without residual deficits: Secondary | ICD-10-CM | POA: Diagnosis not present

## 2018-03-07 DIAGNOSIS — D72829 Elevated white blood cell count, unspecified: Secondary | ICD-10-CM | POA: Diagnosis not present

## 2018-03-07 DIAGNOSIS — Z89611 Acquired absence of right leg above knee: Secondary | ICD-10-CM | POA: Diagnosis not present

## 2018-03-07 DIAGNOSIS — E118 Type 2 diabetes mellitus with unspecified complications: Secondary | ICD-10-CM | POA: Diagnosis not present

## 2018-03-07 DIAGNOSIS — I2510 Atherosclerotic heart disease of native coronary artery without angina pectoris: Secondary | ICD-10-CM | POA: Diagnosis not present

## 2018-03-07 DIAGNOSIS — E162 Hypoglycemia, unspecified: Secondary | ICD-10-CM | POA: Diagnosis not present

## 2018-03-07 DIAGNOSIS — E11649 Type 2 diabetes mellitus with hypoglycemia without coma: Secondary | ICD-10-CM | POA: Diagnosis not present

## 2018-03-07 DIAGNOSIS — N183 Chronic kidney disease, stage 3 (moderate): Secondary | ICD-10-CM | POA: Diagnosis not present

## 2018-03-07 DIAGNOSIS — I739 Peripheral vascular disease, unspecified: Secondary | ICD-10-CM | POA: Diagnosis not present

## 2018-03-07 DIAGNOSIS — N184 Chronic kidney disease, stage 4 (severe): Secondary | ICD-10-CM | POA: Diagnosis not present

## 2018-03-07 DIAGNOSIS — Z955 Presence of coronary angioplasty implant and graft: Secondary | ICD-10-CM | POA: Diagnosis not present

## 2018-03-07 DIAGNOSIS — E1122 Type 2 diabetes mellitus with diabetic chronic kidney disease: Secondary | ICD-10-CM | POA: Diagnosis not present

## 2018-03-07 DIAGNOSIS — E782 Mixed hyperlipidemia: Secondary | ICD-10-CM | POA: Diagnosis not present

## 2018-03-07 DIAGNOSIS — M79604 Pain in right leg: Secondary | ICD-10-CM | POA: Diagnosis not present

## 2018-03-07 DIAGNOSIS — R278 Other lack of coordination: Secondary | ICD-10-CM | POA: Diagnosis not present

## 2018-03-07 DIAGNOSIS — E1159 Type 2 diabetes mellitus with other circulatory complications: Secondary | ICD-10-CM | POA: Diagnosis not present

## 2018-03-08 DIAGNOSIS — N189 Chronic kidney disease, unspecified: Secondary | ICD-10-CM | POA: Diagnosis not present

## 2018-03-08 DIAGNOSIS — E875 Hyperkalemia: Secondary | ICD-10-CM | POA: Diagnosis not present

## 2018-03-08 DIAGNOSIS — E872 Acidosis: Secondary | ICD-10-CM | POA: Diagnosis not present

## 2018-03-08 DIAGNOSIS — E162 Hypoglycemia, unspecified: Secondary | ICD-10-CM | POA: Diagnosis not present

## 2018-03-09 DIAGNOSIS — E875 Hyperkalemia: Secondary | ICD-10-CM | POA: Diagnosis not present

## 2018-03-09 DIAGNOSIS — E162 Hypoglycemia, unspecified: Secondary | ICD-10-CM | POA: Diagnosis not present

## 2018-03-09 DIAGNOSIS — N189 Chronic kidney disease, unspecified: Secondary | ICD-10-CM | POA: Diagnosis not present

## 2018-03-09 DIAGNOSIS — E872 Acidosis: Secondary | ICD-10-CM | POA: Diagnosis not present

## 2018-03-10 DIAGNOSIS — I130 Hypertensive heart and chronic kidney disease with heart failure and stage 1 through stage 4 chronic kidney disease, or unspecified chronic kidney disease: Secondary | ICD-10-CM | POA: Diagnosis not present

## 2018-03-10 DIAGNOSIS — R0602 Shortness of breath: Secondary | ICD-10-CM | POA: Diagnosis not present

## 2018-03-10 DIAGNOSIS — E11610 Type 2 diabetes mellitus with diabetic neuropathic arthropathy: Secondary | ICD-10-CM | POA: Diagnosis not present

## 2018-03-10 DIAGNOSIS — E43 Unspecified severe protein-calorie malnutrition: Secondary | ICD-10-CM | POA: Diagnosis not present

## 2018-03-10 DIAGNOSIS — I2510 Atherosclerotic heart disease of native coronary artery without angina pectoris: Secondary | ICD-10-CM | POA: Diagnosis not present

## 2018-03-10 DIAGNOSIS — K219 Gastro-esophageal reflux disease without esophagitis: Secondary | ICD-10-CM | POA: Diagnosis not present

## 2018-03-10 DIAGNOSIS — I739 Peripheral vascular disease, unspecified: Secondary | ICD-10-CM | POA: Diagnosis not present

## 2018-03-10 DIAGNOSIS — E1159 Type 2 diabetes mellitus with other circulatory complications: Secondary | ICD-10-CM | POA: Diagnosis not present

## 2018-03-10 DIAGNOSIS — E119 Type 2 diabetes mellitus without complications: Secondary | ICD-10-CM | POA: Diagnosis not present

## 2018-03-10 DIAGNOSIS — I129 Hypertensive chronic kidney disease with stage 1 through stage 4 chronic kidney disease, or unspecified chronic kidney disease: Secondary | ICD-10-CM | POA: Diagnosis not present

## 2018-03-10 DIAGNOSIS — I272 Pulmonary hypertension, unspecified: Secondary | ICD-10-CM | POA: Diagnosis not present

## 2018-03-10 DIAGNOSIS — I10 Essential (primary) hypertension: Secondary | ICD-10-CM | POA: Diagnosis not present

## 2018-03-10 DIAGNOSIS — K5904 Chronic idiopathic constipation: Secondary | ICD-10-CM | POA: Diagnosis not present

## 2018-03-10 DIAGNOSIS — E118 Type 2 diabetes mellitus with unspecified complications: Secondary | ICD-10-CM | POA: Diagnosis not present

## 2018-03-10 DIAGNOSIS — R0789 Other chest pain: Secondary | ICD-10-CM | POA: Diagnosis not present

## 2018-03-10 DIAGNOSIS — Z4781 Encounter for orthopedic aftercare following surgical amputation: Secondary | ICD-10-CM | POA: Diagnosis not present

## 2018-03-10 DIAGNOSIS — E872 Acidosis: Secondary | ICD-10-CM | POA: Diagnosis not present

## 2018-03-10 DIAGNOSIS — N289 Disorder of kidney and ureter, unspecified: Secondary | ICD-10-CM | POA: Diagnosis not present

## 2018-03-10 DIAGNOSIS — Z1619 Resistance to other specified beta lactam antibiotics: Secondary | ICD-10-CM | POA: Diagnosis not present

## 2018-03-10 DIAGNOSIS — L859 Epidermal thickening, unspecified: Secondary | ICD-10-CM | POA: Diagnosis not present

## 2018-03-10 DIAGNOSIS — R278 Other lack of coordination: Secondary | ICD-10-CM | POA: Diagnosis not present

## 2018-03-10 DIAGNOSIS — I350 Nonrheumatic aortic (valve) stenosis: Secondary | ICD-10-CM | POA: Diagnosis not present

## 2018-03-10 DIAGNOSIS — E782 Mixed hyperlipidemia: Secondary | ICD-10-CM | POA: Diagnosis not present

## 2018-03-10 DIAGNOSIS — R079 Chest pain, unspecified: Secondary | ICD-10-CM | POA: Diagnosis not present

## 2018-03-10 DIAGNOSIS — I5041 Acute combined systolic (congestive) and diastolic (congestive) heart failure: Secondary | ICD-10-CM | POA: Diagnosis not present

## 2018-03-10 DIAGNOSIS — N189 Chronic kidney disease, unspecified: Secondary | ICD-10-CM | POA: Diagnosis not present

## 2018-03-10 DIAGNOSIS — M79604 Pain in right leg: Secondary | ICD-10-CM | POA: Diagnosis not present

## 2018-03-10 DIAGNOSIS — I5021 Acute systolic (congestive) heart failure: Secondary | ICD-10-CM | POA: Diagnosis not present

## 2018-03-10 DIAGNOSIS — Z89611 Acquired absence of right leg above knee: Secondary | ICD-10-CM | POA: Diagnosis not present

## 2018-03-10 DIAGNOSIS — R0902 Hypoxemia: Secondary | ICD-10-CM | POA: Diagnosis not present

## 2018-03-10 DIAGNOSIS — E162 Hypoglycemia, unspecified: Secondary | ICD-10-CM | POA: Diagnosis not present

## 2018-03-10 DIAGNOSIS — I8310 Varicose veins of unspecified lower extremity with inflammation: Secondary | ICD-10-CM | POA: Diagnosis not present

## 2018-03-10 DIAGNOSIS — N183 Chronic kidney disease, stage 3 (moderate): Secondary | ICD-10-CM | POA: Diagnosis not present

## 2018-03-10 DIAGNOSIS — Z8719 Personal history of other diseases of the digestive system: Secondary | ICD-10-CM | POA: Diagnosis not present

## 2018-03-10 DIAGNOSIS — E875 Hyperkalemia: Secondary | ICD-10-CM | POA: Diagnosis not present

## 2018-03-10 DIAGNOSIS — R293 Abnormal posture: Secondary | ICD-10-CM | POA: Diagnosis not present

## 2018-03-12 DIAGNOSIS — I350 Nonrheumatic aortic (valve) stenosis: Secondary | ICD-10-CM

## 2018-03-12 DIAGNOSIS — E43 Unspecified severe protein-calorie malnutrition: Secondary | ICD-10-CM

## 2018-03-12 DIAGNOSIS — I272 Pulmonary hypertension, unspecified: Secondary | ICD-10-CM | POA: Diagnosis not present

## 2018-03-12 DIAGNOSIS — R079 Chest pain, unspecified: Secondary | ICD-10-CM

## 2018-03-12 DIAGNOSIS — I2510 Atherosclerotic heart disease of native coronary artery without angina pectoris: Secondary | ICD-10-CM

## 2018-03-12 DIAGNOSIS — N189 Chronic kidney disease, unspecified: Secondary | ICD-10-CM

## 2018-03-12 DIAGNOSIS — N183 Chronic kidney disease, stage 3 (moderate): Secondary | ICD-10-CM

## 2018-03-12 DIAGNOSIS — I10 Essential (primary) hypertension: Secondary | ICD-10-CM

## 2018-03-12 DIAGNOSIS — E119 Type 2 diabetes mellitus without complications: Secondary | ICD-10-CM

## 2018-03-12 DIAGNOSIS — K219 Gastro-esophageal reflux disease without esophagitis: Secondary | ICD-10-CM

## 2018-03-13 DIAGNOSIS — E43 Unspecified severe protein-calorie malnutrition: Secondary | ICD-10-CM | POA: Diagnosis not present

## 2018-03-13 DIAGNOSIS — M2550 Pain in unspecified joint: Secondary | ICD-10-CM | POA: Diagnosis not present

## 2018-03-13 DIAGNOSIS — I5043 Acute on chronic combined systolic (congestive) and diastolic (congestive) heart failure: Secondary | ICD-10-CM | POA: Diagnosis not present

## 2018-03-13 DIAGNOSIS — M545 Low back pain: Secondary | ICD-10-CM | POA: Diagnosis not present

## 2018-03-13 DIAGNOSIS — I5021 Acute systolic (congestive) heart failure: Secondary | ICD-10-CM | POA: Diagnosis not present

## 2018-03-13 DIAGNOSIS — N189 Chronic kidney disease, unspecified: Secondary | ICD-10-CM | POA: Diagnosis not present

## 2018-03-13 DIAGNOSIS — R627 Adult failure to thrive: Secondary | ICD-10-CM | POA: Diagnosis not present

## 2018-03-13 DIAGNOSIS — Z1619 Resistance to other specified beta lactam antibiotics: Secondary | ICD-10-CM | POA: Diagnosis not present

## 2018-03-13 DIAGNOSIS — I200 Unstable angina: Secondary | ICD-10-CM | POA: Diagnosis not present

## 2018-03-13 DIAGNOSIS — J189 Pneumonia, unspecified organism: Secondary | ICD-10-CM | POA: Diagnosis not present

## 2018-03-13 DIAGNOSIS — M1990 Unspecified osteoarthritis, unspecified site: Secondary | ICD-10-CM | POA: Diagnosis not present

## 2018-03-13 DIAGNOSIS — I5020 Unspecified systolic (congestive) heart failure: Secondary | ICD-10-CM | POA: Diagnosis not present

## 2018-03-13 DIAGNOSIS — R5381 Other malaise: Secondary | ICD-10-CM | POA: Diagnosis not present

## 2018-03-13 DIAGNOSIS — N390 Urinary tract infection, site not specified: Secondary | ICD-10-CM | POA: Diagnosis not present

## 2018-03-13 DIAGNOSIS — R0602 Shortness of breath: Secondary | ICD-10-CM | POA: Diagnosis not present

## 2018-03-13 DIAGNOSIS — I130 Hypertensive heart and chronic kidney disease with heart failure and stage 1 through stage 4 chronic kidney disease, or unspecified chronic kidney disease: Secondary | ICD-10-CM | POA: Diagnosis not present

## 2018-03-13 DIAGNOSIS — R079 Chest pain, unspecified: Secondary | ICD-10-CM | POA: Diagnosis not present

## 2018-03-13 DIAGNOSIS — Z993 Dependence on wheelchair: Secondary | ICD-10-CM | POA: Diagnosis not present

## 2018-03-13 DIAGNOSIS — I739 Peripheral vascular disease, unspecified: Secondary | ICD-10-CM | POA: Diagnosis not present

## 2018-03-13 DIAGNOSIS — N289 Disorder of kidney and ureter, unspecified: Secondary | ICD-10-CM | POA: Diagnosis not present

## 2018-03-13 DIAGNOSIS — E1159 Type 2 diabetes mellitus with other circulatory complications: Secondary | ICD-10-CM | POA: Diagnosis not present

## 2018-03-13 DIAGNOSIS — N183 Chronic kidney disease, stage 3 (moderate): Secondary | ICD-10-CM | POA: Diagnosis not present

## 2018-03-13 DIAGNOSIS — E1122 Type 2 diabetes mellitus with diabetic chronic kidney disease: Secondary | ICD-10-CM | POA: Diagnosis not present

## 2018-03-13 DIAGNOSIS — I440 Atrioventricular block, first degree: Secondary | ICD-10-CM | POA: Diagnosis not present

## 2018-03-13 DIAGNOSIS — M160 Bilateral primary osteoarthritis of hip: Secondary | ICD-10-CM | POA: Diagnosis not present

## 2018-03-13 DIAGNOSIS — E1165 Type 2 diabetes mellitus with hyperglycemia: Secondary | ICD-10-CM | POA: Diagnosis not present

## 2018-03-13 DIAGNOSIS — I2510 Atherosclerotic heart disease of native coronary artery without angina pectoris: Secondary | ICD-10-CM | POA: Diagnosis not present

## 2018-03-13 DIAGNOSIS — D631 Anemia in chronic kidney disease: Secondary | ICD-10-CM | POA: Diagnosis not present

## 2018-03-13 DIAGNOSIS — R0789 Other chest pain: Secondary | ICD-10-CM | POA: Diagnosis not present

## 2018-03-13 DIAGNOSIS — I872 Venous insufficiency (chronic) (peripheral): Secondary | ICD-10-CM | POA: Diagnosis not present

## 2018-03-13 DIAGNOSIS — Z7401 Bed confinement status: Secondary | ICD-10-CM | POA: Diagnosis not present

## 2018-03-13 DIAGNOSIS — I129 Hypertensive chronic kidney disease with stage 1 through stage 4 chronic kidney disease, or unspecified chronic kidney disease: Secondary | ICD-10-CM | POA: Diagnosis not present

## 2018-03-13 DIAGNOSIS — I350 Nonrheumatic aortic (valve) stenosis: Secondary | ICD-10-CM | POA: Diagnosis not present

## 2018-03-13 DIAGNOSIS — E872 Acidosis: Secondary | ICD-10-CM | POA: Diagnosis not present

## 2018-03-13 DIAGNOSIS — R262 Difficulty in walking, not elsewhere classified: Secondary | ICD-10-CM | POA: Diagnosis not present

## 2018-03-13 DIAGNOSIS — M25551 Pain in right hip: Secondary | ICD-10-CM | POA: Diagnosis not present

## 2018-03-13 DIAGNOSIS — R109 Unspecified abdominal pain: Secondary | ICD-10-CM | POA: Diagnosis not present

## 2018-03-13 DIAGNOSIS — Z9181 History of falling: Secondary | ICD-10-CM | POA: Diagnosis not present

## 2018-03-13 DIAGNOSIS — E119 Type 2 diabetes mellitus without complications: Secondary | ICD-10-CM | POA: Diagnosis not present

## 2018-03-13 DIAGNOSIS — M25552 Pain in left hip: Secondary | ICD-10-CM | POA: Diagnosis not present

## 2018-03-13 DIAGNOSIS — Z89611 Acquired absence of right leg above knee: Secondary | ICD-10-CM | POA: Diagnosis not present

## 2018-03-13 DIAGNOSIS — G8929 Other chronic pain: Secondary | ICD-10-CM | POA: Diagnosis not present

## 2018-03-13 DIAGNOSIS — Z66 Do not resuscitate: Secondary | ICD-10-CM | POA: Diagnosis not present

## 2018-03-13 DIAGNOSIS — I272 Pulmonary hypertension, unspecified: Secondary | ICD-10-CM | POA: Diagnosis not present

## 2018-03-13 DIAGNOSIS — E118 Type 2 diabetes mellitus with unspecified complications: Secondary | ICD-10-CM | POA: Diagnosis not present

## 2018-03-14 DIAGNOSIS — I2510 Atherosclerotic heart disease of native coronary artery without angina pectoris: Secondary | ICD-10-CM | POA: Diagnosis not present

## 2018-03-14 DIAGNOSIS — R079 Chest pain, unspecified: Secondary | ICD-10-CM | POA: Diagnosis not present

## 2018-03-14 DIAGNOSIS — N183 Chronic kidney disease, stage 3 (moderate): Secondary | ICD-10-CM | POA: Diagnosis not present

## 2018-03-14 DIAGNOSIS — N189 Chronic kidney disease, unspecified: Secondary | ICD-10-CM | POA: Diagnosis not present

## 2018-03-15 DIAGNOSIS — R079 Chest pain, unspecified: Secondary | ICD-10-CM | POA: Diagnosis not present

## 2018-03-15 DIAGNOSIS — I2510 Atherosclerotic heart disease of native coronary artery without angina pectoris: Secondary | ICD-10-CM | POA: Diagnosis not present

## 2018-03-15 DIAGNOSIS — N183 Chronic kidney disease, stage 3 (moderate): Secondary | ICD-10-CM | POA: Diagnosis not present

## 2018-03-15 DIAGNOSIS — N189 Chronic kidney disease, unspecified: Secondary | ICD-10-CM | POA: Diagnosis not present

## 2018-03-16 DIAGNOSIS — I272 Pulmonary hypertension, unspecified: Secondary | ICD-10-CM | POA: Diagnosis not present

## 2018-03-16 DIAGNOSIS — I350 Nonrheumatic aortic (valve) stenosis: Secondary | ICD-10-CM | POA: Diagnosis not present

## 2018-03-16 DIAGNOSIS — R079 Chest pain, unspecified: Secondary | ICD-10-CM | POA: Diagnosis not present

## 2018-03-16 DIAGNOSIS — N189 Chronic kidney disease, unspecified: Secondary | ICD-10-CM | POA: Diagnosis not present

## 2018-03-17 DIAGNOSIS — N189 Chronic kidney disease, unspecified: Secondary | ICD-10-CM | POA: Diagnosis not present

## 2018-03-17 DIAGNOSIS — R079 Chest pain, unspecified: Secondary | ICD-10-CM | POA: Diagnosis not present

## 2018-03-17 DIAGNOSIS — I350 Nonrheumatic aortic (valve) stenosis: Secondary | ICD-10-CM | POA: Diagnosis not present

## 2018-03-17 DIAGNOSIS — I272 Pulmonary hypertension, unspecified: Secondary | ICD-10-CM | POA: Diagnosis not present

## 2018-03-18 DIAGNOSIS — R079 Chest pain, unspecified: Secondary | ICD-10-CM | POA: Diagnosis not present

## 2018-03-18 DIAGNOSIS — N189 Chronic kidney disease, unspecified: Secondary | ICD-10-CM | POA: Diagnosis not present

## 2018-03-18 DIAGNOSIS — I2510 Atherosclerotic heart disease of native coronary artery without angina pectoris: Secondary | ICD-10-CM | POA: Diagnosis not present

## 2018-03-18 DIAGNOSIS — N183 Chronic kidney disease, stage 3 (moderate): Secondary | ICD-10-CM | POA: Diagnosis not present

## 2018-03-19 DIAGNOSIS — Z7401 Bed confinement status: Secondary | ICD-10-CM | POA: Diagnosis not present

## 2018-03-19 DIAGNOSIS — Z1619 Resistance to other specified beta lactam antibiotics: Secondary | ICD-10-CM | POA: Diagnosis not present

## 2018-03-19 DIAGNOSIS — E119 Type 2 diabetes mellitus without complications: Secondary | ICD-10-CM | POA: Diagnosis not present

## 2018-03-19 DIAGNOSIS — I872 Venous insufficiency (chronic) (peripheral): Secondary | ICD-10-CM | POA: Diagnosis not present

## 2018-03-19 DIAGNOSIS — I350 Nonrheumatic aortic (valve) stenosis: Secondary | ICD-10-CM | POA: Diagnosis not present

## 2018-03-19 DIAGNOSIS — I739 Peripheral vascular disease, unspecified: Secondary | ICD-10-CM | POA: Diagnosis not present

## 2018-03-19 DIAGNOSIS — N183 Chronic kidney disease, stage 3 (moderate): Secondary | ICD-10-CM | POA: Diagnosis not present

## 2018-03-19 DIAGNOSIS — R0789 Other chest pain: Secondary | ICD-10-CM | POA: Diagnosis not present

## 2018-03-19 DIAGNOSIS — I5020 Unspecified systolic (congestive) heart failure: Secondary | ICD-10-CM | POA: Diagnosis not present

## 2018-03-19 DIAGNOSIS — I2510 Atherosclerotic heart disease of native coronary artery without angina pectoris: Secondary | ICD-10-CM | POA: Diagnosis not present

## 2018-03-19 DIAGNOSIS — N390 Urinary tract infection, site not specified: Secondary | ICD-10-CM | POA: Diagnosis not present

## 2018-03-19 DIAGNOSIS — I129 Hypertensive chronic kidney disease with stage 1 through stage 4 chronic kidney disease, or unspecified chronic kidney disease: Secondary | ICD-10-CM | POA: Diagnosis not present

## 2018-03-19 DIAGNOSIS — N289 Disorder of kidney and ureter, unspecified: Secondary | ICD-10-CM | POA: Diagnosis not present

## 2018-03-19 DIAGNOSIS — D631 Anemia in chronic kidney disease: Secondary | ICD-10-CM | POA: Diagnosis not present

## 2018-03-19 DIAGNOSIS — E872 Acidosis: Secondary | ICD-10-CM | POA: Diagnosis not present

## 2018-03-19 DIAGNOSIS — R262 Difficulty in walking, not elsewhere classified: Secondary | ICD-10-CM | POA: Diagnosis not present

## 2018-03-19 DIAGNOSIS — R079 Chest pain, unspecified: Secondary | ICD-10-CM | POA: Diagnosis not present

## 2018-03-19 DIAGNOSIS — N189 Chronic kidney disease, unspecified: Secondary | ICD-10-CM | POA: Diagnosis not present

## 2018-03-19 DIAGNOSIS — J189 Pneumonia, unspecified organism: Secondary | ICD-10-CM | POA: Diagnosis not present

## 2018-03-19 DIAGNOSIS — E43 Unspecified severe protein-calorie malnutrition: Secondary | ICD-10-CM | POA: Diagnosis not present

## 2018-03-19 DIAGNOSIS — M2550 Pain in unspecified joint: Secondary | ICD-10-CM | POA: Diagnosis not present

## 2018-03-19 DIAGNOSIS — I200 Unstable angina: Secondary | ICD-10-CM | POA: Diagnosis not present

## 2018-03-19 DIAGNOSIS — E118 Type 2 diabetes mellitus with unspecified complications: Secondary | ICD-10-CM | POA: Diagnosis not present

## 2018-03-19 DIAGNOSIS — I272 Pulmonary hypertension, unspecified: Secondary | ICD-10-CM | POA: Diagnosis not present

## 2018-03-25 DIAGNOSIS — R262 Difficulty in walking, not elsewhere classified: Secondary | ICD-10-CM | POA: Diagnosis not present

## 2018-04-12 DEATH — deceased

## 2018-05-13 DEATH — deceased

## 2018-09-01 IMAGING — DX DG CHEST 1V PORT
1 series · 1 of 1 positions shown · non-contrast
Comparison: 03/01/2017

CLINICAL DATA: Short of breath

EXAM:
PORTABLE CHEST 1 VIEW

[chest]
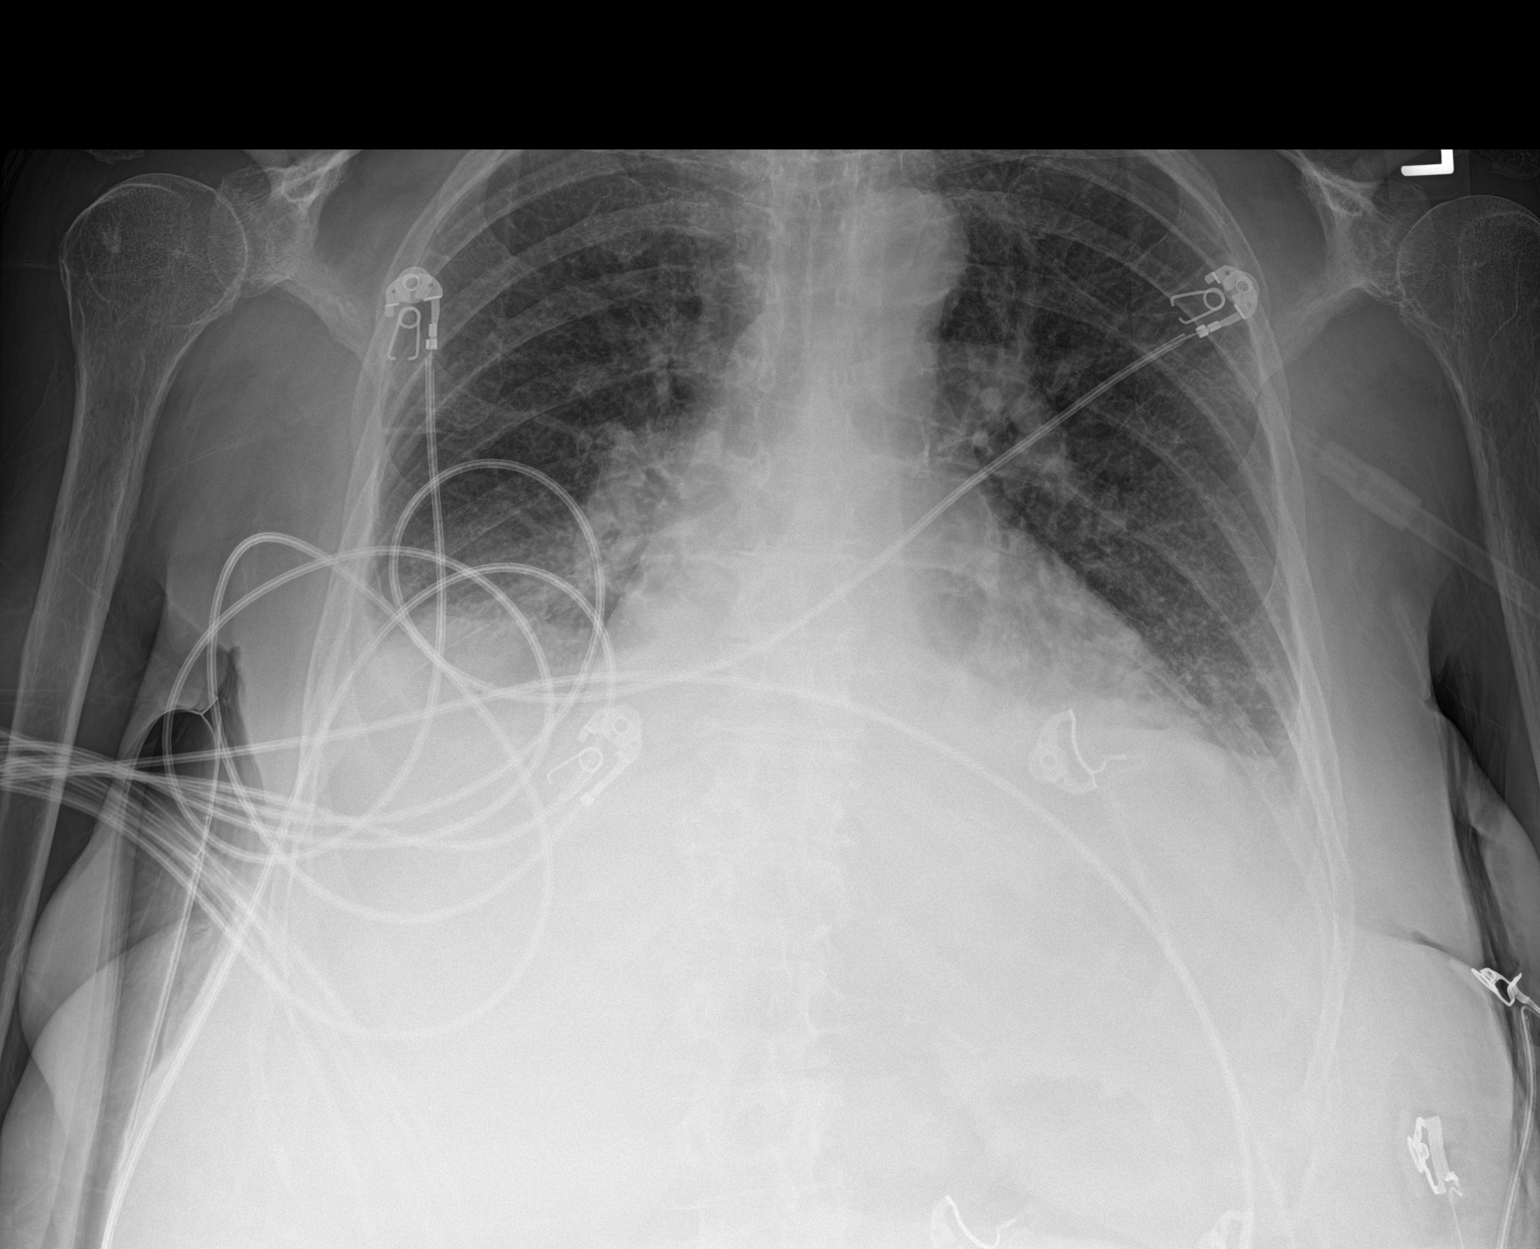

[1 of 1 positions shown; findings below may reference images not displayed]

FINDINGS: Low lung volumes. Cardiomegaly with vascular congestion and mild
diffuse interstitial edema. Small pleural effusions and bibasilar
atelectasis.
IMPRESSION: Low lung volumes. Cardiomegaly with vascular congestion, mild edema
and small pleural effusions.
